# Patient Record
Sex: Female | Born: 1940 | Race: Black or African American | Hispanic: No | Smoking: Never smoker
Health system: Southern US, Community
[De-identification: ages and names within clinical notes are randomized; demographics above are authoritative.]

## PROBLEM LIST (undated history)

## (undated) DIAGNOSIS — Z853 Personal history of malignant neoplasm of breast: Secondary | ICD-10-CM

## (undated) DIAGNOSIS — I700 Atherosclerosis of aorta: Secondary | ICD-10-CM

## (undated) DIAGNOSIS — Z9221 Personal history of antineoplastic chemotherapy: Secondary | ICD-10-CM

## (undated) DIAGNOSIS — D213 Benign neoplasm of connective and other soft tissue of thorax: Secondary | ICD-10-CM

## (undated) DIAGNOSIS — Z8719 Personal history of other diseases of the digestive system: Secondary | ICD-10-CM

## (undated) DIAGNOSIS — R42 Dizziness and giddiness: Secondary | ICD-10-CM

## (undated) DIAGNOSIS — Z7901 Long term (current) use of anticoagulants: Secondary | ICD-10-CM

## (undated) DIAGNOSIS — E119 Type 2 diabetes mellitus without complications: Secondary | ICD-10-CM

## (undated) DIAGNOSIS — C50219 Malignant neoplasm of upper-inner quadrant of unspecified female breast: Secondary | ICD-10-CM

## (undated) DIAGNOSIS — Z923 Personal history of irradiation: Secondary | ICD-10-CM

## (undated) DIAGNOSIS — I10 Essential (primary) hypertension: Secondary | ICD-10-CM

## (undated) DIAGNOSIS — Z87442 Personal history of urinary calculi: Secondary | ICD-10-CM

## (undated) DIAGNOSIS — C50211 Malignant neoplasm of upper-inner quadrant of right female breast: Secondary | ICD-10-CM

## (undated) DIAGNOSIS — C50919 Malignant neoplasm of unspecified site of unspecified female breast: Secondary | ICD-10-CM

## (undated) DIAGNOSIS — I639 Cerebral infarction, unspecified: Secondary | ICD-10-CM

## (undated) DIAGNOSIS — I69354 Hemiplegia and hemiparesis following cerebral infarction affecting left non-dominant side: Secondary | ICD-10-CM

## (undated) DIAGNOSIS — N3090 Cystitis, unspecified without hematuria: Secondary | ICD-10-CM

## (undated) DIAGNOSIS — K469 Unspecified abdominal hernia without obstruction or gangrene: Secondary | ICD-10-CM

## (undated) DIAGNOSIS — F419 Anxiety disorder, unspecified: Secondary | ICD-10-CM

## (undated) DIAGNOSIS — K219 Gastro-esophageal reflux disease without esophagitis: Secondary | ICD-10-CM

## (undated) DIAGNOSIS — M1990 Unspecified osteoarthritis, unspecified site: Secondary | ICD-10-CM

## (undated) DIAGNOSIS — E785 Hyperlipidemia, unspecified: Secondary | ICD-10-CM

## (undated) HISTORY — DX: Malignant neoplasm of upper-inner quadrant of unspecified female breast: C50.219

## (undated) HISTORY — DX: Personal history of malignant neoplasm of breast: Z85.3

## (undated) HISTORY — PX: PORT-A-CATH REMOVAL: SHX5289

## (undated) HISTORY — DX: Benign neoplasm of connective and other soft tissue of thorax: D21.3

## (undated) HISTORY — DX: Unspecified abdominal hernia without obstruction or gangrene: K46.9

## (undated) HISTORY — DX: Dizziness and giddiness: R42

## (undated) HISTORY — DX: Hyperlipidemia, unspecified: E78.5

## (undated) HISTORY — DX: Malignant neoplasm of upper-inner quadrant of right female breast: C50.211

## (undated) HISTORY — DX: Cystitis, unspecified without hematuria: N30.90

---

## 2002-08-07 HISTORY — PX: DILATION AND CURETTAGE OF UTERUS: SHX78

## 2005-05-31 ENCOUNTER — Ambulatory Visit: Payer: Self-pay

## 2005-07-10 ENCOUNTER — Emergency Department: Payer: Self-pay | Admitting: Emergency Medicine

## 2005-07-11 ENCOUNTER — Ambulatory Visit: Payer: Self-pay | Admitting: Emergency Medicine

## 2005-07-11 ENCOUNTER — Other Ambulatory Visit: Payer: Self-pay

## 2005-08-07 HISTORY — PX: CHOLECYSTECTOMY: SHX55

## 2005-08-08 ENCOUNTER — Ambulatory Visit: Payer: Self-pay | Admitting: General Surgery

## 2006-06-20 ENCOUNTER — Ambulatory Visit: Payer: Self-pay | Admitting: Family Medicine

## 2007-05-28 ENCOUNTER — Ambulatory Visit: Payer: Self-pay | Admitting: Gastroenterology

## 2007-06-24 ENCOUNTER — Ambulatory Visit: Payer: Self-pay | Admitting: Family Medicine

## 2007-09-10 ENCOUNTER — Ambulatory Visit: Payer: Self-pay | Admitting: General Surgery

## 2007-09-10 HISTORY — PX: HERNIA REPAIR: SHX51

## 2007-12-18 ENCOUNTER — Ambulatory Visit: Payer: Self-pay | Admitting: Family Medicine

## 2007-12-23 ENCOUNTER — Other Ambulatory Visit: Payer: Self-pay

## 2007-12-23 ENCOUNTER — Emergency Department: Payer: Self-pay | Admitting: Emergency Medicine

## 2008-01-02 ENCOUNTER — Ambulatory Visit: Payer: Self-pay | Admitting: General Surgery

## 2008-01-07 ENCOUNTER — Ambulatory Visit: Payer: Self-pay | Admitting: General Surgery

## 2008-01-07 HISTORY — PX: RECURRENT HERNIA: SHX6046

## 2008-03-31 ENCOUNTER — Emergency Department: Payer: Self-pay | Admitting: Emergency Medicine

## 2008-03-31 ENCOUNTER — Other Ambulatory Visit: Payer: Self-pay

## 2008-06-25 ENCOUNTER — Ambulatory Visit: Payer: Self-pay | Admitting: Family Medicine

## 2008-07-08 ENCOUNTER — Ambulatory Visit: Payer: Self-pay | Admitting: Family Medicine

## 2008-07-27 ENCOUNTER — Emergency Department: Payer: Self-pay | Admitting: Emergency Medicine

## 2009-01-28 DIAGNOSIS — N882 Stricture and stenosis of cervix uteri: Secondary | ICD-10-CM | POA: Insufficient documentation

## 2009-06-29 ENCOUNTER — Ambulatory Visit: Payer: Self-pay | Admitting: Family Medicine

## 2009-08-07 DIAGNOSIS — K469 Unspecified abdominal hernia without obstruction or gangrene: Secondary | ICD-10-CM

## 2009-08-07 HISTORY — PX: UPPER GI ENDOSCOPY: SHX6162

## 2009-08-07 HISTORY — DX: Unspecified abdominal hernia without obstruction or gangrene: K46.9

## 2009-08-07 HISTORY — PX: COLONOSCOPY: SHX174

## 2009-09-26 ENCOUNTER — Emergency Department: Payer: Self-pay | Admitting: Internal Medicine

## 2009-10-06 ENCOUNTER — Emergency Department: Payer: Self-pay | Admitting: Emergency Medicine

## 2009-12-24 ENCOUNTER — Ambulatory Visit: Payer: Self-pay | Admitting: Family Medicine

## 2010-01-24 ENCOUNTER — Ambulatory Visit: Payer: Self-pay | Admitting: Family Medicine

## 2010-01-26 ENCOUNTER — Ambulatory Visit: Payer: Self-pay | Admitting: Family Medicine

## 2010-02-02 ENCOUNTER — Emergency Department: Payer: Self-pay | Admitting: Emergency Medicine

## 2010-02-14 ENCOUNTER — Ambulatory Visit: Payer: Self-pay | Admitting: Family Medicine

## 2010-03-02 ENCOUNTER — Ambulatory Visit: Payer: Self-pay | Admitting: Gastroenterology

## 2010-07-05 ENCOUNTER — Ambulatory Visit: Payer: Self-pay | Admitting: Family Medicine

## 2010-08-07 HISTORY — PX: BREAST SURGERY: SHX581

## 2010-11-01 ENCOUNTER — Emergency Department: Payer: Self-pay | Admitting: Emergency Medicine

## 2011-01-29 ENCOUNTER — Emergency Department: Payer: Self-pay | Admitting: Emergency Medicine

## 2011-07-10 ENCOUNTER — Ambulatory Visit: Payer: Self-pay | Admitting: Family Medicine

## 2011-07-11 ENCOUNTER — Ambulatory Visit: Payer: Self-pay | Admitting: Family Medicine

## 2011-07-18 HISTORY — PX: BREAST BIOPSY: SHX20

## 2011-07-21 ENCOUNTER — Ambulatory Visit: Payer: Self-pay | Admitting: General Surgery

## 2011-08-08 DIAGNOSIS — Z9221 Personal history of antineoplastic chemotherapy: Secondary | ICD-10-CM

## 2011-08-08 DIAGNOSIS — C50919 Malignant neoplasm of unspecified site of unspecified female breast: Secondary | ICD-10-CM

## 2011-08-08 HISTORY — PX: PORTACATH PLACEMENT: SHX2246

## 2011-08-08 HISTORY — DX: Personal history of antineoplastic chemotherapy: Z92.21

## 2011-08-08 HISTORY — DX: Malignant neoplasm of unspecified site of unspecified female breast: C50.919

## 2011-08-17 ENCOUNTER — Ambulatory Visit: Payer: Self-pay | Admitting: General Surgery

## 2011-08-17 DIAGNOSIS — Z170 Estrogen receptor positive status [ER+]: Secondary | ICD-10-CM | POA: Insufficient documentation

## 2011-08-17 DIAGNOSIS — C50219 Malignant neoplasm of upper-inner quadrant of unspecified female breast: Secondary | ICD-10-CM

## 2011-08-17 DIAGNOSIS — C50411 Malignant neoplasm of upper-outer quadrant of right female breast: Secondary | ICD-10-CM | POA: Insufficient documentation

## 2011-08-17 HISTORY — PX: BREAST LUMPECTOMY: SHX2

## 2011-08-17 HISTORY — DX: Malignant neoplasm of upper-inner quadrant of unspecified female breast: C50.219

## 2011-08-18 LAB — PATHOLOGY REPORT

## 2011-08-23 ENCOUNTER — Ambulatory Visit: Payer: Self-pay | Admitting: Internal Medicine

## 2011-08-31 LAB — CBC CANCER CENTER
Basophil #: 0.1 x10 3/mm (ref 0.0–0.1)
HCT: 44.8 % (ref 35.0–47.0)
Lymphocyte #: 2.3 x10 3/mm (ref 1.0–3.6)
Lymphocyte %: 37.8 %
MCHC: 34.3 g/dL (ref 32.0–36.0)
Monocyte #: 0.5 x10 3/mm (ref 0.0–0.7)
Monocyte %: 9 %
Neutrophil #: 2.8 x10 3/mm (ref 1.4–6.5)
Neutrophil %: 46.9 %
Platelet: 301 x10 3/mm (ref 150–440)
RDW: 12.4 % (ref 11.5–14.5)
WBC: 6 x10 3/mm (ref 3.6–11.0)

## 2011-08-31 LAB — COMPREHENSIVE METABOLIC PANEL
Albumin: 3.7 g/dL (ref 3.4–5.0)
Calcium, Total: 9.6 mg/dL (ref 8.5–10.1)
Chloride: 106 mmol/L (ref 98–107)
Glucose: 77 mg/dL (ref 65–99)
Osmolality: 282 (ref 275–301)
Potassium: 3.9 mmol/L (ref 3.5–5.1)
SGPT (ALT): 26 U/L
Sodium: 142 mmol/L (ref 136–145)
Total Protein: 7.9 g/dL (ref 6.4–8.2)

## 2011-09-04 ENCOUNTER — Ambulatory Visit: Payer: Self-pay | Admitting: General Surgery

## 2011-09-08 ENCOUNTER — Ambulatory Visit: Payer: Self-pay | Admitting: Internal Medicine

## 2011-09-11 LAB — CBC CANCER CENTER
Basophil #: 0.1 x10 3/mm (ref 0.0–0.1)
Basophil %: 1 %
Eosinophil #: 0.3 x10 3/mm (ref 0.0–0.7)
Eosinophil %: 4.8 %
HGB: 14.9 g/dL (ref 12.0–16.0)
Lymphocyte #: 1.8 x10 3/mm (ref 1.0–3.6)
Lymphocyte %: 30.6 %
MCH: 30.7 pg (ref 26.0–34.0)
MCHC: 34.4 g/dL (ref 32.0–36.0)
MCV: 89 fL (ref 80–100)
Monocyte #: 0.4 x10 3/mm (ref 0.0–0.7)
Monocyte %: 7.5 %
Neutrophil #: 3.3 x10 3/mm (ref 1.4–6.5)
Neutrophil %: 56.1 %
RBC: 4.84 10*6/uL (ref 3.80–5.20)
RDW: 12.4 % (ref 11.5–14.5)
WBC: 6 x10 3/mm (ref 3.6–11.0)

## 2011-09-11 LAB — BASIC METABOLIC PANEL
Anion Gap: 7 (ref 7–16)
BUN: 8 mg/dL (ref 7–18)
Calcium, Total: 9.1 mg/dL (ref 8.5–10.1)
Chloride: 103 mmol/L (ref 98–107)
Creatinine: 0.77 mg/dL (ref 0.60–1.30)
EGFR (African American): >60
Glucose: 106 mg/dL — ABNORMAL HIGH (ref 65–99)
Osmolality: 282 (ref 275–301)
Potassium: 3.7 mmol/L (ref 3.5–5.1)
Sodium: 142 mmol/L (ref 136–145)

## 2011-09-18 LAB — CBC CANCER CENTER
Basophil #: 0 x10 3/mm (ref 0.0–0.1)
Eosinophil %: 8.4 %
HCT: 44.6 % (ref 35.0–47.0)
Lymphocyte #: 1.3 x10 3/mm (ref 1.0–3.6)
MCH: 30.7 pg (ref 26.0–34.0)
MCV: 89 fL (ref 80–100)
Monocyte %: 1.5 %
Neutrophil %: 48.2 %
Platelet: 126 x10 3/mm — ABNORMAL LOW (ref 150–440)

## 2011-09-25 LAB — CBC CANCER CENTER
Basophil #: 0.2 x10 3/mm — ABNORMAL HIGH (ref 0.0–0.1)
Eosinophil %: 0.7 %
HGB: 14.4 g/dL (ref 12.0–16.0)
Lymphocyte #: 1.5 x10 3/mm (ref 1.0–3.6)
Lymphocyte %: 14.3 %
Monocyte #: 0.5 x10 3/mm (ref 0.0–0.7)
Monocyte %: 5 %
Neutrophil %: 78.1 %
Platelet: 266 x10 3/mm (ref 150–440)
RBC: 4.68 10*6/uL (ref 3.80–5.20)
RDW: 12.4 % (ref 11.5–14.5)
WBC: 10.8 x10 3/mm (ref 3.6–11.0)

## 2011-10-02 LAB — CBC CANCER CENTER
Eosinophil #: 0.1 x10 3/mm (ref 0.0–0.7)
Eosinophil %: 0.8 %
MCH: 30.7 pg (ref 26.0–34.0)
MCHC: 33.9 g/dL (ref 32.0–36.0)
Monocyte %: 12.9 %
Neutrophil #: 3.8 x10 3/mm (ref 1.4–6.5)
Neutrophil %: 59 %
Platelet: 384 x10 3/mm (ref 150–440)
RBC: 4.34 10*6/uL (ref 3.80–5.20)
WBC: 6.4 x10 3/mm (ref 3.6–11.0)

## 2011-10-02 LAB — COMPREHENSIVE METABOLIC PANEL
Albumin: 3.6 g/dL (ref 3.4–5.0)
Alkaline Phosphatase: 101 U/L (ref 50–136)
BUN: 15 mg/dL (ref 7–18)
Chloride: 104 mmol/L (ref 98–107)
Creatinine: 0.77 mg/dL (ref 0.60–1.30)
EGFR (Non-African Amer.): >60
Osmolality: 286 (ref 275–301)
Potassium: 3.8 mmol/L (ref 3.5–5.1)
Total Protein: 7.2 g/dL (ref 6.4–8.2)

## 2011-10-06 ENCOUNTER — Ambulatory Visit: Payer: Self-pay | Admitting: Internal Medicine

## 2011-10-09 LAB — CBC CANCER CENTER
Basophil #: 0 x10 3/mm (ref 0.0–0.1)
Eosinophil %: 1.6 %
HCT: 39.3 % (ref 35.0–47.0)
Lymphocyte #: 0.9 x10 3/mm — ABNORMAL LOW (ref 1.0–3.6)
Lymphocyte %: 29.2 %
MCH: 30.7 pg (ref 26.0–34.0)
MCHC: 33.9 g/dL (ref 32.0–36.0)
MCV: 90 fL (ref 80–100)
Neutrophil #: 2 x10 3/mm (ref 1.4–6.5)
Platelet: 141 x10 3/mm — ABNORMAL LOW (ref 150–440)
RDW: 13.3 % (ref 11.5–14.5)

## 2011-10-16 LAB — CBC CANCER CENTER
Basophil #: 0 x10 3/mm (ref 0.0–0.1)
HCT: 38.5 % (ref 35.0–47.0)
HGB: 13.3 g/dL (ref 12.0–16.0)
Lymphocyte %: 15.2 %
MCV: 91 fL (ref 80–100)
Monocyte %: 6.6 %
Neutrophil #: 8 x10 3/mm — ABNORMAL HIGH (ref 1.4–6.5)
RBC: 4.25 10*6/uL (ref 3.80–5.20)
RDW: 13.3 % (ref 11.5–14.5)
WBC: 10.5 x10 3/mm (ref 3.6–11.0)

## 2011-10-23 LAB — COMPREHENSIVE METABOLIC PANEL WITH GFR
Albumin: 3.6 g/dL
Alkaline Phosphatase: 89 U/L
Anion Gap: 9
BUN: 11 mg/dL
Bilirubin,Total: 0.3 mg/dL
Calcium, Total: 9 mg/dL
Chloride: 103 mmol/L
Co2: 28 mmol/L
Creatinine: 0.83 mg/dL
EGFR (African American): >60
EGFR (Non-African Amer.): >60
Glucose: 164 mg/dL — ABNORMAL HIGH
Osmolality: 282
Potassium: 3.5 mmol/L
SGOT(AST): 19 U/L
SGPT (ALT): 32 U/L
Sodium: 140 mmol/L
Total Protein: 6.9 g/dL

## 2011-10-23 LAB — CBC CANCER CENTER
Basophil #: 0.1 "x10 3/mm "
Basophil %: 1.4 %
Eosinophil #: 0.1 "x10 3/mm "
Eosinophil %: 1.4 %
HCT: 36.9 %
HGB: 12.8 g/dL
Lymphocyte %: 24.2 %
Lymphs Abs: 1.2 "x10 3/mm "
MCH: 31.7 pg
MCHC: 34.6 g/dL
MCV: 92 fL
Monocyte #: 0.7 "x10 3/mm "
Monocyte %: 14.3 %
Neutrophil #: 2.8 "x10 3/mm "
Neutrophil %: 58.7 %
Platelet: 359 "x10 3/mm "
RBC: 4.03 "x10 6/mm "
RDW: 15 % — ABNORMAL HIGH
WBC: 4.8 "x10 3/mm "

## 2011-10-30 LAB — CBC CANCER CENTER
Basophil #: 0 x10 3/mm (ref 0.0–0.1)
Basophil %: 1 %
Eosinophil #: 0 x10 3/mm (ref 0.0–0.7)
HCT: 36.2 % (ref 35.0–47.0)
Lymphocyte %: 23.7 %
MCH: 31.5 pg (ref 26.0–34.0)
Monocyte #: 0.1 x10 3/mm (ref 0.0–0.7)
Monocyte %: 2.1 %
Neutrophil #: 2.1 x10 3/mm (ref 1.4–6.5)
Neutrophil %: 71.8 %
Platelet: 98 x10 3/mm — ABNORMAL LOW (ref 150–440)
RBC: 3.98 10*6/uL (ref 3.80–5.20)
RDW: 14.9 % — ABNORMAL HIGH (ref 11.5–14.5)
WBC: 3 x10 3/mm — ABNORMAL LOW (ref 3.6–11.0)

## 2011-11-06 ENCOUNTER — Ambulatory Visit: Payer: Self-pay | Admitting: Internal Medicine

## 2011-11-06 LAB — CBC CANCER CENTER
Basophil #: 0 x10 3/mm (ref 0.0–0.1)
Eosinophil #: 0.1 x10 3/mm (ref 0.0–0.7)
HCT: 37.5 % (ref 35.0–47.0)
HGB: 12.8 g/dL (ref 12.0–16.0)
Lymphocyte #: 1.3 x10 3/mm (ref 1.0–3.6)
MCH: 31.5 pg (ref 26.0–34.0)
MCV: 92 fL (ref 80–100)
Monocyte #: 0.8 x10 3/mm — ABNORMAL HIGH (ref 0.0–0.7)
Monocyte %: 9.6 %
Neutrophil #: 5.8 x10 3/mm (ref 1.4–6.5)
Neutrophil %: 72.5 %
Platelet: 230 x10 3/mm (ref 150–440)
WBC: 8 x10 3/mm (ref 3.6–11.0)

## 2011-11-13 LAB — COMPREHENSIVE METABOLIC PANEL
Albumin: 3.8 g/dL (ref 3.4–5.0)
Anion Gap: 10 (ref 7–16)
BUN: 15 mg/dL (ref 7–18)
Bilirubin,Total: 0.2 mg/dL (ref 0.2–1.0)
Co2: 27 mmol/L (ref 21–32)
EGFR (African American): >60
EGFR (Non-African Amer.): >60
Glucose: 155 mg/dL — ABNORMAL HIGH (ref 65–99)
Potassium: 4 mmol/L (ref 3.5–5.1)
SGOT(AST): 21 U/L (ref 15–37)
SGPT (ALT): 27 U/L
Sodium: 143 mmol/L (ref 136–145)
Total Protein: 7.2 g/dL (ref 6.4–8.2)

## 2011-11-13 LAB — CBC CANCER CENTER
Basophil #: 0 x10 3/mm (ref 0.0–0.1)
HGB: 12.3 g/dL (ref 12.0–16.0)
Lymphocyte %: 21.5 %
MCHC: 34.4 g/dL (ref 32.0–36.0)
MCV: 93 fL (ref 80–100)
Monocyte %: 17.1 %
Neutrophil %: 59.1 %
Platelet: 319 x10 3/mm (ref 150–440)
RDW: 17.1 % — ABNORMAL HIGH (ref 11.5–14.5)

## 2011-11-20 LAB — CBC CANCER CENTER
Basophil #: 0 x10 3/mm (ref 0.0–0.1)
Basophil %: 0.9 %
HCT: 36.2 % (ref 35.0–47.0)
HGB: 12.1 g/dL (ref 12.0–16.0)
Lymphocyte %: 15.9 %
MCH: 31.7 pg (ref 26.0–34.0)
MCHC: 33.4 g/dL (ref 32.0–36.0)
MCV: 95 fL (ref 80–100)
Neutrophil #: 3.5 x10 3/mm (ref 1.4–6.5)
Neutrophil %: 79.4 %
RBC: 3.81 10*6/uL (ref 3.80–5.20)
RDW: 15.9 % — ABNORMAL HIGH (ref 11.5–14.5)

## 2011-11-27 LAB — CBC CANCER CENTER
Basophil #: 0.1 x10 3/mm (ref 0.0–0.1)
HCT: 38.4 % (ref 35.0–47.0)
HGB: 12.7 g/dL (ref 12.0–16.0)
Lymphocyte #: 1.2 x10 3/mm (ref 1.0–3.6)
Lymphocyte %: 15.9 %
MCH: 31.7 pg (ref 26.0–34.0)
Monocyte #: 1.3 x10 3/mm — ABNORMAL HIGH (ref 0.2–0.9)
Monocyte %: 17.1 %
Neutrophil %: 64.2 %
RBC: 4.02 10*6/uL (ref 3.80–5.20)
RDW: 16.6 % — ABNORMAL HIGH (ref 11.5–14.5)
WBC: 7.4 x10 3/mm (ref 3.6–11.0)

## 2011-12-04 LAB — COMPREHENSIVE METABOLIC PANEL
Alkaline Phosphatase: 94 U/L (ref 50–136)
BUN: 13 mg/dL (ref 7–18)
Bilirubin,Total: 0.3 mg/dL (ref 0.2–1.0)
Calcium, Total: 9.4 mg/dL (ref 8.5–10.1)
Chloride: 102 mmol/L (ref 98–107)
Creatinine: 0.99 mg/dL (ref 0.60–1.30)
EGFR (Non-African Amer.): 58 — ABNORMAL LOW
Osmolality: 290 (ref 275–301)
Potassium: 4.1 mmol/L (ref 3.5–5.1)
SGPT (ALT): 44 U/L

## 2011-12-04 LAB — CBC CANCER CENTER
Basophil %: 0.3 %
Eosinophil #: 0 x10 3/mm (ref 0.0–0.7)
Eosinophil %: 0 %
MCH: 31.6 pg (ref 26.0–34.0)
Monocyte %: 0.5 %
Neutrophil #: 4.1 x10 3/mm (ref 1.4–6.5)
Neutrophil %: 87.5 %
RDW: 17 % — ABNORMAL HIGH (ref 11.5–14.5)

## 2011-12-06 ENCOUNTER — Ambulatory Visit: Payer: Self-pay | Admitting: Internal Medicine

## 2011-12-11 LAB — CBC CANCER CENTER
Eosinophil #: 0 x10 3/mm (ref 0.0–0.7)
Eosinophil %: 0 %
MCV: 97 fL (ref 80–100)
Monocyte %: 1.6 %
Neutrophil #: 4.5 x10 3/mm (ref 1.4–6.5)
Platelet: 272 x10 3/mm (ref 150–440)
WBC: 5 x10 3/mm (ref 3.6–11.0)

## 2011-12-18 LAB — CBC CANCER CENTER
Basophil #: 0 x10 3/mm (ref 0.0–0.1)
Eosinophil #: 0 x10 3/mm (ref 0.0–0.7)
HCT: 37.1 % (ref 35.0–47.0)
HGB: 12.5 g/dL (ref 12.0–16.0)
Lymphocyte #: 0.6 x10 3/mm — ABNORMAL LOW (ref 1.0–3.6)
Lymphocyte %: 13.6 %
MCH: 32.9 pg (ref 26.0–34.0)
MCHC: 33.6 g/dL (ref 32.0–36.0)
MCV: 98 fL (ref 80–100)
Monocyte #: 0.1 x10 3/mm — ABNORMAL LOW (ref 0.2–0.9)
Neutrophil #: 3.6 x10 3/mm (ref 1.4–6.5)
Neutrophil %: 84.1 %
RBC: 3.78 10*6/uL — ABNORMAL LOW (ref 3.80–5.20)
WBC: 4.3 x10 3/mm (ref 3.6–11.0)

## 2011-12-25 LAB — CBC CANCER CENTER
Basophil #: 0.2 x10 3/mm — ABNORMAL HIGH (ref 0.0–0.1)
Eosinophil #: 0.3 x10 3/mm (ref 0.0–0.7)
HCT: 37.9 % (ref 35.0–47.0)
HGB: 12.7 g/dL (ref 12.0–16.0)
Lymphocyte #: 0.8 x10 3/mm — ABNORMAL LOW (ref 1.0–3.6)
Lymphocyte %: 17 %
MCH: 32.6 pg (ref 26.0–34.0)
MCHC: 33.5 g/dL (ref 32.0–36.0)
Monocyte #: 0.6 x10 3/mm (ref 0.2–0.9)
Neutrophil #: 3 x10 3/mm (ref 1.4–6.5)
Neutrophil %: 62.3 %
Platelet: 319 x10 3/mm (ref 150–440)
RBC: 3.89 10*6/uL (ref 3.80–5.20)
RDW: 15.1 % — ABNORMAL HIGH (ref 11.5–14.5)

## 2011-12-25 LAB — URINALYSIS, COMPLETE
Bilirubin,UR: NEGATIVE
Blood: NEGATIVE
Glucose,UR: NEGATIVE mg/dL (ref 0–75)
Ketone: NEGATIVE
Ph: 5 (ref 4.5–8.0)
Protein: NEGATIVE
Squamous Epithelial: 1
WBC UR: 3 /HPF (ref 0–5)

## 2011-12-25 LAB — COMPREHENSIVE METABOLIC PANEL
Albumin: 3.5 g/dL (ref 3.4–5.0)
Anion Gap: 13 (ref 7–16)
BUN: 12 mg/dL (ref 7–18)
Bilirubin,Total: 0.3 mg/dL (ref 0.2–1.0)
Calcium, Total: 8.9 mg/dL (ref 8.5–10.1)
Chloride: 103 mmol/L (ref 98–107)
EGFR (African American): >60
Glucose: 188 mg/dL — ABNORMAL HIGH (ref 65–99)
Potassium: 3.9 mmol/L (ref 3.5–5.1)
SGOT(AST): 27 U/L (ref 15–37)
SGPT (ALT): 38 U/L
Sodium: 140 mmol/L (ref 136–145)
Total Protein: 7.1 g/dL (ref 6.4–8.2)

## 2012-01-02 LAB — CBC CANCER CENTER
Basophil #: 0.1 x10 3/mm (ref 0.0–0.1)
Basophil %: 1.7 %
Eosinophil #: 0.2 x10 3/mm (ref 0.0–0.7)
HCT: 35 % (ref 35.0–47.0)
Lymphocyte %: 17.8 %
MCV: 96 fL (ref 80–100)
Monocyte %: 13.9 %
Neutrophil #: 3.7 x10 3/mm (ref 1.4–6.5)
Platelet: 293 x10 3/mm (ref 150–440)
WBC: 5.9 x10 3/mm (ref 3.6–11.0)

## 2012-01-06 ENCOUNTER — Ambulatory Visit: Payer: Self-pay | Admitting: Internal Medicine

## 2012-01-08 LAB — CBC CANCER CENTER
Basophil %: 1.4 %
Eosinophil #: 0.2 x10 3/mm (ref 0.0–0.7)
HCT: 36.9 % (ref 35.0–47.0)
HGB: 12.3 g/dL (ref 12.0–16.0)
Lymphocyte #: 0.8 x10 3/mm — ABNORMAL LOW (ref 1.0–3.6)
Lymphocyte %: 14 %
MCH: 32.4 pg (ref 26.0–34.0)
MCV: 97 fL (ref 80–100)
Monocyte #: 0.5 x10 3/mm (ref 0.2–0.9)
Neutrophil #: 4.1 x10 3/mm (ref 1.4–6.5)
Neutrophil %: 73.3 %
RBC: 3.81 10*6/uL (ref 3.80–5.20)
WBC: 5.6 x10 3/mm (ref 3.6–11.0)

## 2012-01-15 LAB — COMPREHENSIVE METABOLIC PANEL
Albumin: 3.2 g/dL — ABNORMAL LOW (ref 3.4–5.0)
Anion Gap: 12 (ref 7–16)
BUN: 12 mg/dL (ref 7–18)
Bilirubin,Total: 0.2 mg/dL (ref 0.2–1.0)
Calcium, Total: 8.9 mg/dL (ref 8.5–10.1)
Co2: 25 mmol/L (ref 21–32)
EGFR (African American): >60
Osmolality: 276 (ref 275–301)
Potassium: 4.2 mmol/L (ref 3.5–5.1)
SGOT(AST): 22 U/L (ref 15–37)
Sodium: 134 mmol/L — ABNORMAL LOW (ref 136–145)
Total Protein: 7.1 g/dL (ref 6.4–8.2)

## 2012-01-15 LAB — CBC CANCER CENTER
Basophil #: 0.1 x10 3/mm (ref 0.0–0.1)
Basophil %: 1.2 %
Eosinophil #: 0.1 x10 3/mm (ref 0.0–0.7)
HGB: 11.9 g/dL — ABNORMAL LOW (ref 12.0–16.0)
Lymphocyte %: 14.7 %
MCH: 32.6 pg (ref 26.0–34.0)
MCHC: 33.4 g/dL (ref 32.0–36.0)
Monocyte #: 0.7 x10 3/mm (ref 0.2–0.9)
Monocyte %: 10.6 %
Neutrophil #: 4.7 x10 3/mm (ref 1.4–6.5)
Neutrophil %: 72 %
RBC: 3.66 10*6/uL — ABNORMAL LOW (ref 3.80–5.20)
RDW: 14 % (ref 11.5–14.5)
WBC: 6.6 x10 3/mm (ref 3.6–11.0)

## 2012-01-18 ENCOUNTER — Emergency Department: Payer: Self-pay | Admitting: Emergency Medicine

## 2012-01-18 LAB — APTT: Activated PTT: 29.7 secs (ref 23.6–35.9)

## 2012-01-18 LAB — COMPREHENSIVE METABOLIC PANEL
Albumin: 3.3 g/dL — ABNORMAL LOW (ref 3.4–5.0)
Alkaline Phosphatase: 91 U/L (ref 50–136)
Anion Gap: 8 (ref 7–16)
BUN: 8 mg/dL (ref 7–18)
Bilirubin,Total: 0.3 mg/dL (ref 0.2–1.0)
Chloride: 103 mmol/L (ref 98–107)
Co2: 26 mmol/L (ref 21–32)
EGFR (African American): >60
Glucose: 165 mg/dL — ABNORMAL HIGH (ref 65–99)
Osmolality: 276 (ref 275–301)
Potassium: 4.1 mmol/L (ref 3.5–5.1)
SGPT (ALT): 28 U/L
Total Protein: 7.3 g/dL (ref 6.4–8.2)

## 2012-01-18 LAB — PROTIME-INR
INR: 1
Prothrombin Time: 13.8 secs (ref 11.5–14.7)

## 2012-01-18 LAB — CBC
MCH: 32.5 pg (ref 26.0–34.0)
MCV: 96 fL (ref 80–100)
Platelet: 341 10*3/uL (ref 150–440)
RBC: 3.75 10*6/uL — ABNORMAL LOW (ref 3.80–5.20)
RDW: 14.3 % (ref 11.5–14.5)
WBC: 5.4 10*3/uL (ref 3.6–11.0)

## 2012-01-22 LAB — CBC CANCER CENTER
Basophil #: 0.1 x10 3/mm (ref 0.0–0.1)
Basophil %: 1.3 %
Eosinophil %: 1.8 %
Lymphocyte #: 0.9 x10 3/mm — ABNORMAL LOW (ref 1.0–3.6)
Lymphocyte %: 12.2 %
MCH: 32.2 pg (ref 26.0–34.0)
MCHC: 33.3 g/dL (ref 32.0–36.0)
MCV: 97 fL (ref 80–100)
Neutrophil #: 5.3 x10 3/mm (ref 1.4–6.5)
Neutrophil %: 74.4 %
Platelet: 389 x10 3/mm (ref 150–440)
RBC: 3.71 10*6/uL — ABNORMAL LOW (ref 3.80–5.20)
RDW: 14.1 % (ref 11.5–14.5)
WBC: 7.1 x10 3/mm (ref 3.6–11.0)

## 2012-01-26 LAB — CBC CANCER CENTER
Basophil #: 0.1 x10 3/mm (ref 0.0–0.1)
HGB: 12.9 g/dL (ref 12.0–16.0)
Lymphocyte %: 18.3 %
MCH: 32.3 pg (ref 26.0–34.0)
MCHC: 33.6 g/dL (ref 32.0–36.0)
MCV: 96 fL (ref 80–100)
Monocyte #: 0.3 x10 3/mm (ref 0.2–0.9)
Neutrophil #: 3 x10 3/mm (ref 1.4–6.5)
RBC: 4.01 10*6/uL (ref 3.80–5.20)
RDW: 14.2 % (ref 11.5–14.5)
WBC: 4.1 x10 3/mm (ref 3.6–11.0)

## 2012-01-26 LAB — COMPREHENSIVE METABOLIC PANEL
Albumin: 3.3 g/dL — ABNORMAL LOW (ref 3.4–5.0)
Alkaline Phosphatase: 110 U/L (ref 50–136)
BUN: 9 mg/dL (ref 7–18)
Bilirubin,Total: 0.2 mg/dL (ref 0.2–1.0)
Calcium, Total: 9.9 mg/dL (ref 8.5–10.1)
Chloride: 100 mmol/L (ref 98–107)
EGFR (African American): >60
EGFR (Non-African Amer.): >60
Glucose: 192 mg/dL — ABNORMAL HIGH (ref 65–99)
Osmolality: 276 (ref 275–301)

## 2012-01-29 LAB — CBC CANCER CENTER
Basophil #: 0.1 x10 3/mm (ref 0.0–0.1)
Eosinophil #: 0.1 x10 3/mm (ref 0.0–0.7)
HCT: 35 % (ref 35.0–47.0)
Lymphocyte %: 15.2 %
MCH: 32.2 pg (ref 26.0–34.0)
MCHC: 33.6 g/dL (ref 32.0–36.0)
Monocyte #: 0.7 x10 3/mm (ref 0.2–0.9)
Monocyte %: 11.2 %
Neutrophil #: 4.3 x10 3/mm (ref 1.4–6.5)
Neutrophil %: 70.8 %
Platelet: 381 x10 3/mm (ref 150–440)
RBC: 3.65 10*6/uL — ABNORMAL LOW (ref 3.80–5.20)
RDW: 14.5 % (ref 11.5–14.5)
WBC: 6 x10 3/mm (ref 3.6–11.0)

## 2012-02-05 ENCOUNTER — Ambulatory Visit: Payer: Self-pay | Admitting: Internal Medicine

## 2012-02-05 LAB — COMPREHENSIVE METABOLIC PANEL
Alkaline Phosphatase: 90 U/L (ref 50–136)
Anion Gap: 11 (ref 7–16)
BUN: 9 mg/dL (ref 7–18)
Bilirubin,Total: 0.2 mg/dL (ref 0.2–1.0)
Calcium, Total: 8.9 mg/dL (ref 8.5–10.1)
Chloride: 100 mmol/L (ref 98–107)
Co2: 24 mmol/L (ref 21–32)
EGFR (African American): >60
EGFR (Non-African Amer.): >60
Osmolality: 279 (ref 275–301)
Potassium: 4.1 mmol/L (ref 3.5–5.1)
SGOT(AST): 25 U/L (ref 15–37)
SGPT (ALT): 34 U/L
Total Protein: 6.8 g/dL (ref 6.4–8.2)

## 2012-02-05 LAB — CBC CANCER CENTER
Basophil #: 0.1 x10 3/mm (ref 0.0–0.1)
Basophil %: 1.3 %
Eosinophil %: 2.5 %
HCT: 36.8 % (ref 35.0–47.0)
Lymphocyte #: 1 x10 3/mm (ref 1.0–3.6)
Lymphocyte %: 18.1 %
MCHC: 33 g/dL (ref 32.0–36.0)
MCV: 96 fL (ref 80–100)
Monocyte #: 0.6 x10 3/mm (ref 0.2–0.9)
Monocyte %: 12 %
Neutrophil #: 3.5 x10 3/mm (ref 1.4–6.5)
Neutrophil %: 66.1 %
Platelet: 370 x10 3/mm (ref 150–440)
RDW: 15 % — ABNORMAL HIGH (ref 11.5–14.5)
WBC: 5.3 x10 3/mm (ref 3.6–11.0)

## 2012-02-12 LAB — CBC CANCER CENTER
Basophil %: 1.6 %
Eosinophil #: 0.1 x10 3/mm (ref 0.0–0.7)
Eosinophil %: 2.1 %
Lymphocyte #: 0.9 x10 3/mm — ABNORMAL LOW (ref 1.0–3.6)
MCH: 31.9 pg (ref 26.0–34.0)
MCV: 95 fL (ref 80–100)
Neutrophil %: 61.2 %
Platelet: 355 x10 3/mm (ref 150–440)
RBC: 3.83 10*6/uL (ref 3.80–5.20)
RDW: 14.9 % — ABNORMAL HIGH (ref 11.5–14.5)
WBC: 4.2 x10 3/mm (ref 3.6–11.0)

## 2012-02-19 LAB — CBC CANCER CENTER
Basophil %: 1.5 %
HCT: 37.5 % (ref 35.0–47.0)
Lymphocyte #: 1 x10 3/mm (ref 1.0–3.6)
Lymphocyte %: 22.6 %
MCH: 31 pg (ref 26.0–34.0)
MCHC: 32.5 g/dL (ref 32.0–36.0)
Monocyte #: 0.5 x10 3/mm (ref 0.2–0.9)
Neutrophil #: 2.7 x10 3/mm (ref 1.4–6.5)
Neutrophil %: 63.2 %
RDW: 15.1 % — ABNORMAL HIGH (ref 11.5–14.5)

## 2012-02-26 LAB — COMPREHENSIVE METABOLIC PANEL
Albumin: 3.2 g/dL — ABNORMAL LOW (ref 3.4–5.0)
Anion Gap: 11 (ref 7–16)
BUN: 9 mg/dL (ref 7–18)
Bilirubin,Total: 0.2 mg/dL (ref 0.2–1.0)
Co2: 25 mmol/L (ref 21–32)
Creatinine: 1.15 mg/dL (ref 0.60–1.30)
EGFR (African American): 56 — ABNORMAL LOW
EGFR (Non-African Amer.): 49 — ABNORMAL LOW
Glucose: 214 mg/dL — ABNORMAL HIGH (ref 65–99)
Osmolality: 285 (ref 275–301)
Potassium: 3.9 mmol/L (ref 3.5–5.1)
Sodium: 140 mmol/L (ref 136–145)
Total Protein: 6.7 g/dL (ref 6.4–8.2)

## 2012-02-26 LAB — CBC CANCER CENTER
Basophil #: 0.1 x10 3/mm (ref 0.0–0.1)
Eosinophil #: 0.1 x10 3/mm (ref 0.0–0.7)
Eosinophil %: 2 %
HCT: 37.9 % (ref 35.0–47.0)
Lymphocyte #: 1 x10 3/mm (ref 1.0–3.6)
MCHC: 32.9 g/dL (ref 32.0–36.0)
MCV: 94 fL (ref 80–100)
Monocyte %: 10 %
Neutrophil #: 2.6 x10 3/mm (ref 1.4–6.5)
Platelet: 274 x10 3/mm (ref 150–440)
RDW: 15.5 % — ABNORMAL HIGH (ref 11.5–14.5)
WBC: 4.1 x10 3/mm (ref 3.6–11.0)

## 2012-02-27 ENCOUNTER — Ambulatory Visit: Payer: Self-pay | Admitting: General Surgery

## 2012-03-07 ENCOUNTER — Ambulatory Visit: Payer: Self-pay | Admitting: Internal Medicine

## 2012-03-08 ENCOUNTER — Emergency Department: Payer: Self-pay | Admitting: *Deleted

## 2012-03-08 LAB — URINALYSIS, COMPLETE
Bacteria: NONE SEEN
Glucose,UR: NEGATIVE mg/dL (ref 0–75)
Nitrite: NEGATIVE
Protein: NEGATIVE
RBC,UR: 2 /HPF (ref 0–5)
WBC UR: 4 /HPF (ref 0–5)

## 2012-03-15 ENCOUNTER — Ambulatory Visit: Payer: Self-pay | Admitting: Family Medicine

## 2012-03-18 LAB — CBC CANCER CENTER
Basophil %: 0.4 %
Eosinophil %: 0.9 %
HCT: 43.1 % (ref 35.0–47.0)
Lymphocyte #: 1.3 x10 3/mm (ref 1.0–3.6)
MCH: 31 pg (ref 26.0–34.0)
MCV: 94 fL (ref 80–100)
Monocyte #: 0.7 x10 3/mm (ref 0.2–0.9)
Monocyte %: 7.3 %
Neutrophil #: 7.2 x10 3/mm — ABNORMAL HIGH (ref 1.4–6.5)
Platelet: 279 x10 3/mm (ref 150–440)
RBC: 4.61 10*6/uL (ref 3.80–5.20)
WBC: 9.3 x10 3/mm (ref 3.6–11.0)

## 2012-03-25 LAB — CBC CANCER CENTER
Basophil #: 0.1 x10 3/mm (ref 0.0–0.1)
Eosinophil #: 0.1 x10 3/mm (ref 0.0–0.7)
Eosinophil %: 0.6 %
HCT: 43.3 % (ref 35.0–47.0)
HGB: 14.2 g/dL (ref 12.0–16.0)
Lymphocyte #: 0.6 x10 3/mm — ABNORMAL LOW (ref 1.0–3.6)
MCHC: 32.7 g/dL (ref 32.0–36.0)
MCV: 93 fL (ref 80–100)
Monocyte %: 5.1 %
Neutrophil #: 9.6 x10 3/mm — ABNORMAL HIGH (ref 1.4–6.5)
Platelet: 266 x10 3/mm (ref 150–440)
RBC: 4.66 10*6/uL (ref 3.80–5.20)
RDW: 14.9 % — ABNORMAL HIGH (ref 11.5–14.5)
WBC: 10.9 x10 3/mm (ref 3.6–11.0)

## 2012-04-01 LAB — CBC CANCER CENTER
Basophil #: 0.1 x10 3/mm (ref 0.0–0.1)
Basophil %: 1.4 %
HCT: 43.6 % (ref 35.0–47.0)
Lymphocyte %: 16.2 %
MCH: 30 pg (ref 26.0–34.0)
MCV: 93 fL (ref 80–100)
Monocyte %: 13.6 %
Neutrophil #: 3.1 x10 3/mm (ref 1.4–6.5)
RBC: 4.7 10*6/uL (ref 3.80–5.20)
RDW: 14.5 % (ref 11.5–14.5)
WBC: 4.7 x10 3/mm (ref 3.6–11.0)

## 2012-04-07 ENCOUNTER — Ambulatory Visit: Payer: Self-pay | Admitting: Internal Medicine

## 2012-04-09 LAB — CBC CANCER CENTER
Basophil %: 1.4 %
Eosinophil #: 0.1 x10 3/mm (ref 0.0–0.7)
Eosinophil %: 3.4 %
HCT: 43.5 % (ref 35.0–47.0)
HGB: 14.1 g/dL (ref 12.0–16.0)
Lymphocyte %: 15 %
MCHC: 32.5 g/dL (ref 32.0–36.0)
Monocyte #: 0.5 x10 3/mm (ref 0.2–0.9)
Neutrophil #: 2.6 x10 3/mm (ref 1.4–6.5)
Neutrophil %: 68.2 %
RBC: 4.7 10*6/uL (ref 3.80–5.20)
WBC: 3.8 x10 3/mm (ref 3.6–11.0)

## 2012-04-15 LAB — CBC CANCER CENTER
Basophil %: 1.2 %
Eosinophil %: 3.6 %
HGB: 14.3 g/dL (ref 12.0–16.0)
MCH: 30.2 pg (ref 26.0–34.0)
MCV: 92 fL (ref 80–100)
Monocyte #: 0.8 x10 3/mm (ref 0.2–0.9)
Monocyte %: 15.9 %
Neutrophil %: 63.9 %
RBC: 4.72 10*6/uL (ref 3.80–5.20)
WBC: 4.7 x10 3/mm (ref 3.6–11.0)

## 2012-04-19 ENCOUNTER — Ambulatory Visit: Payer: Self-pay | Admitting: Family Medicine

## 2012-04-23 LAB — CBC CANCER CENTER
Basophil %: 1.3 %
Eosinophil %: 3.7 %
HCT: 43.6 % (ref 35.0–47.0)
HGB: 14.4 g/dL (ref 12.0–16.0)
Lymphocyte %: 14.9 %
MCHC: 33.1 g/dL (ref 32.0–36.0)
Monocyte #: 0.7 x10 3/mm (ref 0.2–0.9)
Monocyte %: 15.8 %
Neutrophil #: 3 x10 3/mm (ref 1.4–6.5)
Neutrophil %: 64.3 %
RBC: 4.78 10*6/uL (ref 3.80–5.20)
RDW: 14.7 % — ABNORMAL HIGH (ref 11.5–14.5)
WBC: 4.6 x10 3/mm (ref 3.6–11.0)

## 2012-04-29 LAB — COMPREHENSIVE METABOLIC PANEL
Alkaline Phosphatase: 101 U/L (ref 50–136)
Anion Gap: 8 (ref 7–16)
BUN: 10 mg/dL (ref 7–18)
Bilirubin,Total: 0.2 mg/dL (ref 0.2–1.0)
Calcium, Total: 9.1 mg/dL (ref 8.5–10.1)
Chloride: 102 mmol/L (ref 98–107)
Co2: 27 mmol/L (ref 21–32)
Creatinine: 1.06 mg/dL (ref 0.60–1.30)
EGFR (Non-African Amer.): 53 — ABNORMAL LOW
Osmolality: 281 (ref 275–301)
Potassium: 3.7 mmol/L (ref 3.5–5.1)
Sodium: 137 mmol/L (ref 136–145)
Total Protein: 7.2 g/dL (ref 6.4–8.2)

## 2012-04-29 LAB — CBC CANCER CENTER
Basophil %: 1.3 %
Eosinophil %: 3.3 %
HGB: 14 g/dL (ref 12.0–16.0)
Lymphocyte #: 0.6 x10 3/mm — ABNORMAL LOW (ref 1.0–3.6)
MCV: 91 fL (ref 80–100)
Monocyte #: 0.5 x10 3/mm (ref 0.2–0.9)
Platelet: 210 x10 3/mm (ref 150–440)
RBC: 4.7 10*6/uL (ref 3.80–5.20)
WBC: 4.3 x10 3/mm (ref 3.6–11.0)

## 2012-05-07 ENCOUNTER — Ambulatory Visit: Payer: Self-pay | Admitting: Internal Medicine

## 2012-05-20 LAB — CBC CANCER CENTER
Basophil #: 0 x10 3/mm (ref 0.0–0.1)
Eosinophil #: 0.1 x10 3/mm (ref 0.0–0.7)
HGB: 14.4 g/dL (ref 12.0–16.0)
Lymphocyte #: 0.8 x10 3/mm — ABNORMAL LOW (ref 1.0–3.6)
MCH: 30.2 pg (ref 26.0–34.0)
MCHC: 33.1 g/dL (ref 32.0–36.0)
MCV: 91 fL (ref 80–100)
Monocyte #: 0.4 x10 3/mm (ref 0.2–0.9)
Platelet: 217 x10 3/mm (ref 150–440)
RBC: 4.76 10*6/uL (ref 3.80–5.20)

## 2012-06-07 ENCOUNTER — Ambulatory Visit: Payer: Self-pay | Admitting: Internal Medicine

## 2012-06-07 HISTORY — PX: BREAST SURGERY: SHX581

## 2012-06-10 LAB — CBC CANCER CENTER
Basophil #: 0 x10 3/mm (ref 0.0–0.1)
Basophil %: 0.9 %
HCT: 44.2 % (ref 35.0–47.0)
HGB: 14.8 g/dL (ref 12.0–16.0)
Lymphocyte #: 1.2 x10 3/mm (ref 1.0–3.6)
MCH: 30.3 pg (ref 26.0–34.0)
MCV: 90 fL (ref 80–100)
Monocyte %: 10.1 %
Neutrophil #: 3.2 x10 3/mm (ref 1.4–6.5)
Platelet: 218 x10 3/mm (ref 150–440)
RDW: 14.1 % (ref 11.5–14.5)
WBC: 5.2 x10 3/mm (ref 3.6–11.0)

## 2012-07-01 LAB — COMPREHENSIVE METABOLIC PANEL
Albumin: 3.3 g/dL — ABNORMAL LOW (ref 3.4–5.0)
Alkaline Phosphatase: 97 U/L (ref 50–136)
Anion Gap: 10 (ref 7–16)
BUN: 10 mg/dL (ref 7–18)
Chloride: 105 mmol/L (ref 98–107)
Glucose: 157 mg/dL — ABNORMAL HIGH (ref 65–99)
Osmolality: 284 (ref 275–301)
Potassium: 3.7 mmol/L (ref 3.5–5.1)
Sodium: 141 mmol/L (ref 136–145)
Total Protein: 7.1 g/dL (ref 6.4–8.2)

## 2012-07-01 LAB — CBC CANCER CENTER
HCT: 42 % (ref 35.0–47.0)
HGB: 14.3 g/dL (ref 12.0–16.0)
Lymphocyte %: 19.5 %
MCH: 30.6 pg (ref 26.0–34.0)
MCHC: 34.1 g/dL (ref 32.0–36.0)
Monocyte %: 8.9 %
Neutrophil #: 3.4 x10 3/mm (ref 1.4–6.5)
Neutrophil %: 67.8 %
Platelet: 221 x10 3/mm (ref 150–440)

## 2012-07-07 ENCOUNTER — Ambulatory Visit: Payer: Self-pay | Admitting: Internal Medicine

## 2012-07-22 LAB — CBC CANCER CENTER
Basophil %: 1 %
Eosinophil %: 3.2 %
HCT: 42.4 % (ref 35.0–47.0)
HGB: 14.5 g/dL (ref 12.0–16.0)
Lymphocyte #: 1.1 x10 3/mm (ref 1.0–3.6)
MCH: 30.8 pg (ref 26.0–34.0)
MCHC: 34.3 g/dL (ref 32.0–36.0)
MCV: 90 fL (ref 80–100)
Monocyte %: 6.9 %
Neutrophil %: 64.7 %
Platelet: 223 x10 3/mm (ref 150–440)
RBC: 4.73 10*6/uL (ref 3.80–5.20)
WBC: 4.7 x10 3/mm (ref 3.6–11.0)

## 2012-08-07 ENCOUNTER — Ambulatory Visit: Payer: Self-pay | Admitting: Internal Medicine

## 2012-08-12 ENCOUNTER — Ambulatory Visit: Payer: Self-pay | Admitting: General Surgery

## 2012-08-12 LAB — CBC CANCER CENTER
Eosinophil %: 2.8 %
HGB: 14.6 g/dL (ref 12.0–16.0)
Lymphocyte #: 1.3 x10 3/mm (ref 1.0–3.6)
Lymphocyte %: 20.3 %
MCH: 30.8 pg (ref 26.0–34.0)
MCV: 91 fL (ref 80–100)
Monocyte #: 0.4 x10 3/mm (ref 0.2–0.9)
Monocyte %: 7.1 %
Neutrophil #: 4.3 x10 3/mm (ref 1.4–6.5)
RDW: 13.7 % (ref 11.5–14.5)

## 2012-09-02 LAB — CBC CANCER CENTER
Basophil %: 1.1 %
HCT: 44.2 % (ref 35.0–47.0)
HGB: 14.8 g/dL (ref 12.0–16.0)
Lymphocyte #: 1.4 x10 3/mm (ref 1.0–3.6)
Lymphocyte %: 17 %
MCH: 30.5 pg (ref 26.0–34.0)
MCHC: 33.4 g/dL (ref 32.0–36.0)
Monocyte %: 7 %
Neutrophil %: 73.2 %
RBC: 4.84 10*6/uL (ref 3.80–5.20)
RDW: 13.3 % (ref 11.5–14.5)
WBC: 8.3 x10 3/mm (ref 3.6–11.0)

## 2012-09-02 LAB — COMPREHENSIVE METABOLIC PANEL
Albumin: 3.3 g/dL — ABNORMAL LOW (ref 3.4–5.0)
Bilirubin,Total: 0.3 mg/dL (ref 0.2–1.0)
Chloride: 100 mmol/L (ref 98–107)
EGFR (Non-African Amer.): >60
Glucose: 147 mg/dL — ABNORMAL HIGH (ref 65–99)
Osmolality: 279 (ref 275–301)
Potassium: 4.1 mmol/L (ref 3.5–5.1)
SGOT(AST): 18 U/L (ref 15–37)
SGPT (ALT): 32 U/L (ref 12–78)
Total Protein: 7.1 g/dL (ref 6.4–8.2)

## 2012-09-07 ENCOUNTER — Ambulatory Visit: Payer: Self-pay | Admitting: Internal Medicine

## 2012-09-18 ENCOUNTER — Encounter: Payer: Self-pay | Admitting: Physical Medicine and Rehabilitation

## 2012-09-23 LAB — CBC CANCER CENTER
Basophil #: 0.1 x10 3/mm (ref 0.0–0.1)
Basophil %: 1.1 %
Eosinophil #: 0.1 x10 3/mm (ref 0.0–0.7)
Eosinophil %: 2.7 %
HCT: 43.3 % (ref 35.0–47.0)
HGB: 14.8 g/dL (ref 12.0–16.0)
MCHC: 34.3 g/dL (ref 32.0–36.0)
MCV: 90 fL (ref 80–100)
Neutrophil #: 3.5 x10 3/mm (ref 1.4–6.5)
RBC: 4.8 10*6/uL (ref 3.80–5.20)
RDW: 13.4 % (ref 11.5–14.5)

## 2012-10-05 ENCOUNTER — Ambulatory Visit: Payer: Self-pay | Admitting: Internal Medicine

## 2012-10-05 ENCOUNTER — Encounter: Payer: Self-pay | Admitting: Physical Medicine and Rehabilitation

## 2012-10-14 LAB — CBC CANCER CENTER
Basophil %: 1.4 %
Lymphocyte #: 1.1 x10 3/mm (ref 1.0–3.6)
Lymphocyte %: 25.5 %
MCH: 30.4 pg (ref 26.0–34.0)
MCHC: 33.7 g/dL (ref 32.0–36.0)
Monocyte #: 0.5 x10 3/mm (ref 0.2–0.9)
Monocyte %: 11.3 %
Neutrophil #: 2.6 x10 3/mm (ref 1.4–6.5)
Platelet: 236 x10 3/mm (ref 150–440)
RDW: 13.4 % (ref 11.5–14.5)

## 2012-11-04 LAB — CBC CANCER CENTER
Basophil %: 1.2 %
Eosinophil %: 4.6 %
HCT: 43.2 % (ref 35.0–47.0)
Lymphocyte %: 24.8 %
MCH: 31 pg (ref 26.0–34.0)
MCV: 91 fL (ref 80–100)
Monocyte %: 11.4 %
Neutrophil #: 2.6 x10 3/mm (ref 1.4–6.5)

## 2012-11-04 LAB — COMPREHENSIVE METABOLIC PANEL
Albumin: 3.4 g/dL (ref 3.4–5.0)
Alkaline Phosphatase: 92 U/L (ref 50–136)
Anion Gap: 6 — ABNORMAL LOW (ref 7–16)
BUN: 12 mg/dL (ref 7–18)
Bilirubin,Total: 0.2 mg/dL (ref 0.2–1.0)
Calcium, Total: 8.7 mg/dL (ref 8.5–10.1)
Co2: 28 mmol/L (ref 21–32)
Potassium: 3.8 mmol/L (ref 3.5–5.1)
SGOT(AST): 24 U/L (ref 15–37)
SGPT (ALT): 26 U/L (ref 12–78)

## 2012-11-05 ENCOUNTER — Ambulatory Visit: Payer: Self-pay | Admitting: Internal Medicine

## 2012-11-25 LAB — CBC CANCER CENTER
Basophil #: 0.1 x10 3/mm (ref 0.0–0.1)
Basophil %: 1.2 %
Eosinophil #: 0.2 x10 3/mm (ref 0.0–0.7)
Eosinophil %: 3.7 %
HCT: 43.8 % (ref 35.0–47.0)
HGB: 15 g/dL (ref 12.0–16.0)
Lymphocyte #: 1.1 x10 3/mm (ref 1.0–3.6)
Lymphocyte %: 25.2 %
MCH: 31.1 pg (ref 26.0–34.0)
MCV: 91 fL (ref 80–100)
Monocyte #: 0.3 x10 3/mm (ref 0.2–0.9)
Monocyte %: 7.1 %
Neutrophil #: 2.6 x10 3/mm (ref 1.4–6.5)
RBC: 4.81 10*6/uL (ref 3.80–5.20)
RDW: 13.2 % (ref 11.5–14.5)
WBC: 4.2 x10 3/mm (ref 3.6–11.0)

## 2012-12-05 ENCOUNTER — Ambulatory Visit: Payer: Self-pay | Admitting: Internal Medicine

## 2012-12-23 LAB — COMPREHENSIVE METABOLIC PANEL
Albumin: 3.6 g/dL (ref 3.4–5.0)
Alkaline Phosphatase: 100 U/L (ref 50–136)
Anion Gap: 9 (ref 7–16)
BUN: 9 mg/dL (ref 7–18)
Calcium, Total: 9.8 mg/dL (ref 8.5–10.1)
Chloride: 101 mmol/L (ref 98–107)
Co2: 30 mmol/L (ref 21–32)
Creatinine: 1.13 mg/dL (ref 0.60–1.30)
EGFR (African American): 57 — ABNORMAL LOW
EGFR (Non-African Amer.): 49 — ABNORMAL LOW
Glucose: 168 mg/dL — ABNORMAL HIGH (ref 65–99)
Osmolality: 282 (ref 275–301)
Potassium: 4.7 mmol/L (ref 3.5–5.1)
SGOT(AST): 23 U/L (ref 15–37)
SGPT (ALT): 33 U/L (ref 12–78)
Sodium: 140 mmol/L (ref 136–145)
Total Protein: 8 g/dL (ref 6.4–8.2)

## 2012-12-23 LAB — CBC CANCER CENTER
Basophil #: 0.1 x10 3/mm (ref 0.0–0.1)
Eosinophil #: 0.1 x10 3/mm (ref 0.0–0.7)
Eosinophil %: 3 %
HCT: 46 % (ref 35.0–47.0)
HGB: 15.7 g/dL (ref 12.0–16.0)
Lymphocyte #: 1.1 x10 3/mm (ref 1.0–3.6)
Lymphocyte %: 21.6 %
Monocyte #: 0.4 x10 3/mm (ref 0.2–0.9)
Monocyte %: 7.3 %
Neutrophil #: 3.4 x10 3/mm (ref 1.4–6.5)
RBC: 5.06 10*6/uL (ref 3.80–5.20)
RDW: 13.1 % (ref 11.5–14.5)

## 2012-12-23 LAB — URIC ACID: Uric Acid: 4 mg/dL (ref 2.6–6.0)

## 2013-01-05 ENCOUNTER — Ambulatory Visit: Payer: Self-pay | Admitting: Internal Medicine

## 2013-01-24 ENCOUNTER — Ambulatory Visit: Payer: Self-pay | Admitting: Physical Medicine and Rehabilitation

## 2013-02-04 ENCOUNTER — Ambulatory Visit: Payer: Self-pay | Admitting: Internal Medicine

## 2013-03-07 ENCOUNTER — Ambulatory Visit: Payer: Self-pay | Admitting: Internal Medicine

## 2013-03-11 ENCOUNTER — Ambulatory Visit: Payer: Self-pay | Admitting: General Surgery

## 2013-03-13 ENCOUNTER — Encounter: Payer: Self-pay | Admitting: General Surgery

## 2013-03-17 LAB — CBC CANCER CENTER
Basophil %: 1.3 %
HGB: 14.7 g/dL (ref 12.0–16.0)
Lymphocyte %: 27.6 %
MCHC: 34.7 g/dL (ref 32.0–36.0)
MCV: 90 fL (ref 80–100)
Monocyte #: 0.3 x10 3/mm (ref 0.2–0.9)
Monocyte %: 7.9 %
Neutrophil #: 2.6 x10 3/mm (ref 1.4–6.5)
Neutrophil %: 58.3 %
RBC: 4.72 10*6/uL (ref 3.80–5.20)

## 2013-03-17 LAB — HEPATIC FUNCTION PANEL A (ARMC)
Alkaline Phosphatase: 89 U/L (ref 50–136)
Bilirubin,Total: 0.2 mg/dL (ref 0.2–1.0)

## 2013-03-17 LAB — CREATININE, SERUM
Creatinine: 0.84 mg/dL (ref 0.60–1.30)
EGFR (Non-African Amer.): >60

## 2013-03-18 LAB — CANCER ANTIGEN 27.29: CA 27.29: 14.9 U/mL (ref 0.0–38.6)

## 2013-03-31 ENCOUNTER — Encounter: Payer: Self-pay | Admitting: General Surgery

## 2013-03-31 ENCOUNTER — Ambulatory Visit (INDEPENDENT_AMBULATORY_CARE_PROVIDER_SITE_OTHER): Payer: Medicare Other | Admitting: General Surgery

## 2013-03-31 VITALS — BP 134/88 | HR 78 | Resp 14 | Wt 221.0 lb

## 2013-03-31 DIAGNOSIS — C50412 Malignant neoplasm of upper-outer quadrant of left female breast: Secondary | ICD-10-CM

## 2013-03-31 DIAGNOSIS — C50919 Malignant neoplasm of unspecified site of unspecified female breast: Secondary | ICD-10-CM

## 2013-03-31 DIAGNOSIS — C50419 Malignant neoplasm of upper-outer quadrant of unspecified female breast: Secondary | ICD-10-CM

## 2013-03-31 DIAGNOSIS — C50912 Malignant neoplasm of unspecified site of left female breast: Secondary | ICD-10-CM

## 2013-03-31 NOTE — Progress Notes (Signed)
Patient ID: Leah Schaefer, female   DOB: [birth year], 73 y.o.   MRN: [id number]  Chief Complaint  Patient presents with  . Follow-up    mammogram    HPI Leah Schaefer is a 72 y.o. female here for follow up for a unilateral left diagnostic mammogram done at [facility] on 03/11/13. She denies any breast discharge, lumps, or bumps. She does state that she will get some mild pain in the left breast occasionally that is very short in duration. HPI  Past Medical History  Diagnosis Date  . Hernia 2011  . Cystitis   . 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 Personal history of malignant neoplasm of breast   . 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 Colonoscopy  2011    Dr Bluford Kaufmann  . Hernia repair Right 2009    inguinal, and umbilical  . Cholecystectomy  2007  . Dilation and curettage of uterus  2004  . Portacath placement  2013  . Breast surgery Left 2012    wide excision  . Upper gi endoscopy  2011    Family History  Problem Relation Age of Onset  . Ovarian cancer Sister   . Lymphoma Father   . Colon cancer Brother   . Lymphoma Brother     Social History History  Substance Use Topics  . Smoking status: Former Smoker    Types: Cigarettes  . Smokeless tobacco: Never Used  . Alcohol Use: No    No Known Allergies  Current Outpatient Prescriptions  Medication Sig Dispense Refill  . acetaminophen (TYLENOL) 500 MG tablet Take 500 mg by mouth every 6 (six) hours as needed for pain.      . Ascorbic Acid (VITAMIN C) 100 MG tablet Take 100 mg by mouth daily.      . Calcium Carbonate-Vitamin D (CALCIUM 600 + D PO) Take 1 capsule by mouth daily.      Marland Kitchen escitalopram (LEXAPRO) 20 MG tablet Take 20 mg by mouth daily.      . fish oil-omega-3 fatty acids 1000 MG capsule Take 2 g by mouth daily.      Marland Kitchen gabapentin (NEURONTIN) 300 MG capsule Take 300 mg by mouth 2 (two) times  daily.      Marland Kitchen HYDROcodone-acetaminophen (NORCO) 7.5-325 MG per tablet Take 1 tablet by mouth every 6 (six) hours as needed for pain.      . meloxicam (MOBIC) 15 MG tablet Take 15 mg by mouth daily.      . metoprolol tartrate (LOPRESSOR) 25 MG tablet Take 25 mg by mouth 2 (two) times daily.      . Multiple Vitamins-Minerals (CENTRUM SILVER ULTRA WOMENS PO) Take 1 tablet by mouth daily.      . Niacin (VITAMIN B-3 PO) Take 1,000 mg by mouth.      Marland Kitchen omeprazole (PRILOSEC) 20 MG capsule Take 20 mg by mouth 2 (two) times daily.      Marland Kitchen pyridoxine (B-6) 100 MG tablet Take 100 mg by mouth daily.      Jeananne Rama Sulfate (EYE DROPS A/C OP) Apply to eye.      . vitamin B-12 (CYANOCOBALAMIN) 1000 MCG tablet Take 1,000 mcg by mouth daily.      . vitamin E 400 UNIT capsule Take 400 Units by mouth daily.       No current facility-administered  medications for this visit.    Review of Systems Review of Systems  Constitutional: Negative.   Respiratory: Negative.   Cardiovascular: Negative.     Blood pressure 134/88, pulse 78, resp. rate 14, weight 221 lb (100.245 kg).  Physical Exam Physical Exam  Constitutional: She is oriented to person, place, and time. She appears well-developed and well-nourished.  Neck: Neck supple.  Cardiovascular: Normal rate, regular rhythm and normal heart sounds.   Pulmonary/Chest: Effort normal and breath sounds normal. Right breast exhibits no inverted nipple, no mass, no nipple discharge, no skin change and no tenderness. Left breast exhibits skin change (slight thickening along the scar on the upper inner quadrant. very slight increased pigmentation ). Left breast exhibits no inverted nipple, no mass, no nipple discharge and no tenderness.  On the R breast depigmentation at the port site. There is also a 1 cm area of the pigmented skin in the L upper outer quadrant below the wide excision site.   Musculoskeletal:       Arms: Lymphadenopathy:    She has no  cervical adenopathy.  Neurological: She is alert and oriented to person, place, and time.    Data Reviewed Mammogram of the left breast dated 03/11/2013 showed postsurgical changes. Dystrophic calcifications. BI-RAD-3.  Assessment    Doing well status post treatment of her left breast cancer now 18 months status post wide excision and sentinel node biopsy. No evidence of axillary recurrence.    Plan    We'll plan for a followup examination with bilateral diagnostic mammograms in 6 months.       Earline Mayotte 04/01/2013, 11:21 AM

## 2013-04-01 ENCOUNTER — Encounter: Payer: Self-pay | Admitting: General Surgery

## 2013-04-07 ENCOUNTER — Ambulatory Visit: Payer: Self-pay | Admitting: Internal Medicine

## 2013-04-10 ENCOUNTER — Encounter: Payer: Self-pay | Admitting: General Surgery

## 2013-04-25 ENCOUNTER — Encounter: Payer: Self-pay | Admitting: General Surgery

## 2013-05-07 ENCOUNTER — Ambulatory Visit: Payer: Self-pay | Admitting: Internal Medicine

## 2013-06-07 ENCOUNTER — Ambulatory Visit: Payer: Self-pay | Admitting: Internal Medicine

## 2013-07-07 ENCOUNTER — Ambulatory Visit: Payer: Self-pay | Admitting: Internal Medicine

## 2013-08-07 ENCOUNTER — Ambulatory Visit: Payer: Self-pay | Admitting: Internal Medicine

## 2013-09-07 ENCOUNTER — Ambulatory Visit: Payer: Self-pay | Admitting: Internal Medicine

## 2013-09-12 ENCOUNTER — Ambulatory Visit: Payer: Self-pay | Admitting: General Surgery

## 2013-09-12 ENCOUNTER — Encounter: Payer: Self-pay | Admitting: General Surgery

## 2013-09-19 ENCOUNTER — Ambulatory Visit: Payer: Medicare HMO | Admitting: Podiatry

## 2013-09-24 LAB — CREATININE, SERUM
Creatinine: 1.18 mg/dL (ref 0.60–1.30)
EGFR (African American): 54 — ABNORMAL LOW
EGFR (Non-African Amer.): 46 — ABNORMAL LOW

## 2013-09-24 LAB — CBC CANCER CENTER
BASOS PCT: 1 %
Basophil #: 0.1 x10 3/mm (ref 0.0–0.1)
Eosinophil #: 0.2 x10 3/mm (ref 0.0–0.7)
Eosinophil %: 3.9 %
HCT: 44.2 % (ref 35.0–47.0)
HGB: 15.2 g/dL (ref 12.0–16.0)
LYMPHS PCT: 29.9 %
Lymphocyte #: 1.6 x10 3/mm (ref 1.0–3.6)
MCH: 31.3 pg (ref 26.0–34.0)
MCHC: 34.4 g/dL (ref 32.0–36.0)
MCV: 91 fL (ref 80–100)
Monocyte #: 0.3 x10 3/mm (ref 0.2–0.9)
Monocyte %: 6.4 %
Neutrophil #: 3.1 x10 3/mm (ref 1.4–6.5)
Neutrophil %: 58.8 %
Platelet: 237 x10 3/mm (ref 150–440)
RBC: 4.85 10*6/uL (ref 3.80–5.20)
RDW: 12.8 % (ref 11.5–14.5)
WBC: 5.3 x10 3/mm (ref 3.6–11.0)

## 2013-09-24 LAB — HEPATIC FUNCTION PANEL A (ARMC)
ALK PHOS: 77 U/L
Albumin: 3.5 g/dL (ref 3.4–5.0)
Bilirubin, Direct: 0.1 mg/dL (ref 0.00–0.20)
Bilirubin,Total: 0.3 mg/dL (ref 0.2–1.0)
SGOT(AST): 24 U/L (ref 15–37)
SGPT (ALT): 36 U/L (ref 12–78)
TOTAL PROTEIN: 7.3 g/dL (ref 6.4–8.2)

## 2013-09-25 ENCOUNTER — Encounter: Payer: Self-pay | Admitting: General Surgery

## 2013-09-25 ENCOUNTER — Ambulatory Visit (INDEPENDENT_AMBULATORY_CARE_PROVIDER_SITE_OTHER): Payer: Medicare HMO | Admitting: General Surgery

## 2013-09-25 VITALS — BP 130/84 | HR 86 | Resp 14 | Ht 67.0 in | Wt 228.0 lb

## 2013-09-25 DIAGNOSIS — C50919 Malignant neoplasm of unspecified site of unspecified female breast: Secondary | ICD-10-CM

## 2013-09-25 DIAGNOSIS — C50912 Malignant neoplasm of unspecified site of left female breast: Secondary | ICD-10-CM

## 2013-09-25 LAB — CANCER ANTIGEN 27.29: CA 27.29: 17.8 U/mL (ref 0.0–38.6)

## 2013-09-25 NOTE — Progress Notes (Signed)
Patient ID: Leah Schaefer, female   DOB: [birth year], 73 y.o.   MRN: [id number]  Chief Complaint  Patient presents with  . Follow-up    mammogram    HPI Leah Schaefer is a 73 y.o. female. who presents for a breast evaluation. The most recent mammogram was done on 09/12/13. Patient does perform regular self breast checks and gets regular mammograms done. Patient last chemo treatment in April 2014.   HPI  Past Medical History  Diagnosis Date  . Hernia 2011  . Cystitis   . 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 Personal history of malignant neoplasm of breast   . 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 Colonoscopy  2011    Dr Candace Cruise  . Hernia repair Right 2009    inguinal, and umbilical  . Cholecystectomy  2007  . Dilation and curettage of uterus  2004  . Portacath placement  2013  . Upper gi endoscopy  2011  . Breast surgery Left 2012    wide excision  . Breast surgery Left November 2013    Core biopsy of the upper-outer quadrant showed fat necrosis.    Family History  Problem Relation Age of Onset  . Ovarian cancer Sister   . Lymphoma Father   . Colon cancer Brother   . Lymphoma Brother     Social History History  Substance Use Topics  . Smoking status: Former Smoker    Types: Cigarettes  . Smokeless tobacco: Never Used  . Alcohol Use: No    No Known Allergies  Current Outpatient Prescriptions  Medication Sig Dispense Refill  . acetaminophen (TYLENOL) 500 MG tablet Take 500 mg by mouth every 6 (six) hours as needed for pain.      . Ascorbic Acid (VITAMIN C) 100 MG tablet Take 100 mg by mouth daily.      Marland Kitchen aspirin 81 MG tablet Take 81 mg by mouth daily.      Marland Kitchen atorvastatin (LIPITOR) 10 MG tablet Take 10 mg by mouth daily.      . bimatoprost (LUMIGAN) 0.01 % SOLN 1 drop at bedtime.      . Calcium Carbonate-Vitamin D (CALCIUM 600 + D PO)  Take 1 capsule by mouth daily.      Marland Kitchen escitalopram (LEXAPRO) 20 MG tablet Take 20 mg by mouth daily.      . fish oil-omega-3 fatty acids 1000 MG capsule Take 2 g by mouth daily.      Marland Kitchen gabapentin (NEURONTIN) 300 MG capsule Take 300 mg by mouth 2 (two) times daily.      Marland Kitchen HYDROcodone-acetaminophen (NORCO) 7.5-325 MG per tablet Take 1 tablet by mouth every 6 (six) hours as needed for pain.      . meloxicam (MOBIC) 15 MG tablet Take 15 mg by mouth daily.      . metoprolol tartrate (LOPRESSOR) 25 MG tablet Take 25 mg by mouth 2 (two) times daily.      . Multiple Vitamins-Minerals (CENTRUM SILVER ULTRA WOMENS PO) Take 1 tablet by mouth daily.      . Niacin (VITAMIN B-3 PO) Take 1,000 mg by mouth.      Marland Kitchen omeprazole (PRILOSEC) 20 MG capsule Take 20 mg by mouth 2 (two) times daily.      Marland Kitchen pyridoxine (B-6) 100 MG tablet Take 100 mg by mouth daily.      Marland Kitchen  Tetrahydrozoline-Zn Sulfate (EYE DROPS A/C OP) Apply to eye.      . vitamin B-12 (CYANOCOBALAMIN) 1000 MCG tablet Take 1,000 mcg by mouth daily.      . vitamin E 400 UNIT capsule Take 400 Units by mouth daily.       No current facility-administered medications for this visit.    Review of Systems Review of Systems  Constitutional: Negative.   Respiratory: Negative.   Cardiovascular: Negative.     Blood pressure 130/84, pulse 86, resp. rate 14, height 5\' 7"  (1.702 m), weight 228 lb (103.42 kg).  Physical Exam Physical Exam  Constitutional: She is oriented to person, place, and time. She appears well-developed and well-nourished.  Eyes: Conjunctivae are normal.  Neck: Neck supple.  Cardiovascular: Normal rate, regular rhythm and normal heart sounds.   Pulmonary/Chest: Breath sounds normal. Right breast exhibits no inverted nipple, no mass, no nipple discharge, no skin change and no tenderness. Left breast exhibits no inverted nipple, no mass, no nipple discharge, no skin change and no tenderness.  Left breast 1-2 o'clock well healed incision,  Right breast 5 cm pigmentation inferior to the power port.  The patient reports this was secondary to the spray anesthetic used prior to port cannulation. My suspicion would have been slight extravasation of chemotherapeutic agents. The patient reports areas improving.  Lymphadenopathy:    She has no cervical adenopathy.    She has no axillary adenopathy.  Neurological: She is alert and oriented to person, place, and time.  Skin: Skin is warm and dry.    Data Reviewed Creatinine 0.87 with an estimated GFR greater than 60 11/04/2012. On 09/24/2013 creatinine was 1.18 with an estimated GFR of 54. The patient's creatinine an estimated GFR has been variable over the past year. Prior 2014 it was always reported to be above 60.  CA 27-29 normal at 17.8.  Mammogram dated 09/12/2013 was reviewed. Postsurgical changes identified. No interval change. BI-RAD-2.  Assessment    No evidence of recurrent or new breast cancer.  New decrease in renal function.     Plan    The patient has been making use of Mobic for several years in regards to arthritis. She will need to touch base with her PCP to determine at this should be continued in light of her recent change in renal function.  Arrangements were made for followup exam and bilateral mammograms in one year.       Robert Bellow 09/26/2013, 12:22 PM

## 2013-09-25 NOTE — Patient Instructions (Signed)
Patient to return in one yaer bilateral diagnotic mammogram.

## 2013-09-30 ENCOUNTER — Encounter: Payer: Self-pay | Admitting: *Deleted

## 2013-10-01 ENCOUNTER — Encounter: Payer: Self-pay | Admitting: General Surgery

## 2013-10-03 ENCOUNTER — Ambulatory Visit: Payer: Self-pay | Admitting: Family Medicine

## 2013-10-05 ENCOUNTER — Ambulatory Visit: Payer: Self-pay | Admitting: Internal Medicine

## 2013-10-07 ENCOUNTER — Ambulatory Visit: Payer: Self-pay | Admitting: Family Medicine

## 2013-11-05 ENCOUNTER — Ambulatory Visit: Payer: Self-pay | Admitting: Internal Medicine

## 2013-11-05 ENCOUNTER — Ambulatory Visit: Payer: Self-pay | Admitting: Family Medicine

## 2013-12-05 ENCOUNTER — Ambulatory Visit: Payer: Self-pay | Admitting: Internal Medicine

## 2013-12-05 ENCOUNTER — Ambulatory Visit: Payer: Self-pay | Admitting: Family Medicine

## 2014-01-07 ENCOUNTER — Ambulatory Visit: Payer: Self-pay | Admitting: Internal Medicine

## 2014-02-04 ENCOUNTER — Ambulatory Visit: Payer: Self-pay | Admitting: Internal Medicine

## 2014-02-18 LAB — CBC CANCER CENTER
BASOS ABS: 0.1 x10 3/mm (ref 0.0–0.1)
BASOS PCT: 1.4 %
EOS PCT: 5.7 %
Eosinophil #: 0.2 x10 3/mm (ref 0.0–0.7)
HCT: 43.6 % (ref 35.0–47.0)
HGB: 14.6 g/dL (ref 12.0–16.0)
LYMPHS ABS: 1.4 x10 3/mm (ref 1.0–3.6)
LYMPHS PCT: 32.6 %
MCH: 30.7 pg (ref 26.0–34.0)
MCHC: 33.6 g/dL (ref 32.0–36.0)
MCV: 91 fL (ref 80–100)
Monocyte #: 0.3 x10 3/mm (ref 0.2–0.9)
Monocyte %: 8.2 %
NEUTROS ABS: 2.2 x10 3/mm (ref 1.4–6.5)
NEUTROS PCT: 52.1 %
Platelet: 202 x10 3/mm (ref 150–440)
RBC: 4.78 10*6/uL (ref 3.80–5.20)
RDW: 13 % (ref 11.5–14.5)
WBC: 4.2 x10 3/mm (ref 3.6–11.0)

## 2014-02-18 LAB — HEPATIC FUNCTION PANEL A (ARMC)
ALBUMIN: 3.4 g/dL (ref 3.4–5.0)
ALK PHOS: 91 U/L
Bilirubin, Direct: <0.1 mg/dL (ref 0.00–0.20)
Bilirubin,Total: 0.3 mg/dL (ref 0.2–1.0)
SGOT(AST): 21 U/L (ref 15–37)
SGPT (ALT): 32 U/L (ref 12–78)
TOTAL PROTEIN: 7.2 g/dL (ref 6.4–8.2)

## 2014-02-18 LAB — CREATININE, SERUM
CREATININE: 0.89 mg/dL (ref 0.60–1.30)
EGFR (Non-African Amer.): >60

## 2014-02-19 LAB — CANCER ANTIGEN 27.29: CA 27.29: 15 U/mL (ref 0.0–38.6)

## 2014-03-07 ENCOUNTER — Ambulatory Visit: Payer: Self-pay | Admitting: Internal Medicine

## 2014-03-16 ENCOUNTER — Encounter: Payer: Self-pay | Admitting: General Surgery

## 2014-03-16 ENCOUNTER — Ambulatory Visit (INDEPENDENT_AMBULATORY_CARE_PROVIDER_SITE_OTHER): Payer: Medicare HMO | Admitting: General Surgery

## 2014-03-16 VITALS — BP 138/88 | HR 80 | Resp 14 | Ht 67.0 in | Wt 222.0 lb

## 2014-03-16 DIAGNOSIS — C50419 Malignant neoplasm of upper-outer quadrant of unspecified female breast: Secondary | ICD-10-CM

## 2014-03-16 DIAGNOSIS — C50412 Malignant neoplasm of upper-outer quadrant of left female breast: Secondary | ICD-10-CM

## 2014-03-16 NOTE — Patient Instructions (Signed)
Patient to return in 1 week for nurse visit. The patient is aware to call back for any questions or concerns.  

## 2014-03-16 NOTE — Progress Notes (Signed)
Patient ID: Leah Schaefer, female   DOB: 08/30/1940, 73 y.o.   MRN: [id number]  Chief Complaint  Patient presents with  . Procedure    port removal    HPI Leah Schaefer is a 73 y.o. female here today for her port removal. Request for port removal has been obtained from Leah Schaefer, M.D. The procedure was reviewed with the patient. HPI  Past Medical History  Diagnosis Date  . Hernia 2011  . Cystitis   . 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 Personal history of malignant neoplasm of breast   . 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 Colonoscopy  2011    Dr Candace Cruise  . Hernia repair Right 2009    inguinal, and umbilical  . Cholecystectomy  2007  . Dilation and curettage of uterus  2004  . Portacath placement  2013  . Upper gi endoscopy  2011  . Breast surgery Left 2012    wide excision  . Breast surgery Left November 2013    Core biopsy of the upper-outer quadrant showed fat necrosis.    Family History  Problem Relation Age of Onset  . Ovarian cancer Sister   . Lymphoma Father   . Colon cancer Brother   . Lymphoma Brother     Social History History  Substance Use Topics  . Smoking status: Former Smoker    Types: Cigarettes  . Smokeless tobacco: Never Used  . Alcohol Use: No    No Known Allergies  Current Outpatient Prescriptions  Medication Sig Dispense Refill  . acetaminophen (TYLENOL) 500 MG tablet Take 500 mg by mouth every 6 (six) hours as needed for pain.      . Ascorbic Acid (VITAMIN C) 100 MG tablet Take 100 mg by mouth daily.      Marland Kitchen aspirin 81 MG tablet Take 81 mg by mouth daily.      Marland Kitchen atorvastatin (LIPITOR) 10 MG tablet Take 10 mg by mouth daily.      . bimatoprost (LUMIGAN) 0.01 % SOLN 1 drop at bedtime.      . Calcium Carbonate-Vitamin D (CALCIUM 600 + D PO) Take 1 capsule by mouth daily.      Marland Kitchen escitalopram (LEXAPRO) 20 MG  tablet Take 20 mg by mouth daily.      . fish oil-omega-3 fatty acids 1000 MG capsule Take 2 g by mouth daily.      Marland Kitchen gabapentin (NEURONTIN) 300 MG capsule Take 300 mg by mouth 2 (two) times daily.      Marland Kitchen HYDROcodone-acetaminophen (NORCO) 7.5-325 MG per tablet Take 1 tablet by mouth every 6 (six) hours as needed for pain.      . meloxicam (MOBIC) 15 MG tablet Take 15 mg by mouth daily.      . metoprolol tartrate (LOPRESSOR) 25 MG tablet Take 25 mg by mouth 2 (two) times daily.      . Multiple Vitamins-Minerals (CENTRUM SILVER ULTRA WOMENS PO) Take 1 tablet by mouth daily.      . Niacin (VITAMIN B-3 PO) Take 1,000 mg by mouth.      Marland Kitchen omeprazole (PRILOSEC) 20 MG capsule Take 20 mg by mouth 2 (two) times daily.      Marland Kitchen pyridoxine (B-6) 100 MG tablet Take 100 mg by mouth daily.      Bethann Humble Sulfate (EYE DROPS A/C  OP) Apply to eye.      . vitamin B-12 (CYANOCOBALAMIN) 1000 MCG tablet Take 1,000 mcg by mouth daily.      . vitamin E 400 UNIT capsule Take 400 Units by mouth daily.       No current facility-administered medications for this visit.    Review of Systems Review of Systems  Constitutional: Negative.   Respiratory: Negative.   Cardiovascular: Negative.     Blood pressure 138/88, pulse 80, resp. rate 14, height 5\' 7"  (1.702 m), weight 222 lb (100.699 kg).  Physical Exam Physical Exam Examination of the port site shows no evidence of infection or induration on the right chest.     Assessment    Completion of adjuvant chemotherapy status post breast cancer.     Plan    The area was prepped with alcohol and 10 cc of 0.5% Xylocaine with 0.25% Marcaine with 1-200,000 of epinephrine was utilized well-tolerated. This was supplemented with 2 cc of 1% Xylocaine plain. The original incision was opened and the port exposed. A single transfixion suture was evident. The port was removed and the catheter retrieved with an intact tip. No bleeding was evident. The deep tissue  was approximated with a running 3-0 Vicryl. The skin was closed with a running 4-0 Vicryl subcutaneous closed suture. Benzoin and Steri-Strips followed by Telfa Tegaderm dressing was applied. Ice pack provided. Post procedure wound care instructions provided.  The patient will followup for nursing check in one week.     PCP: Etheleen Mayhew 03/17/2014, 7:41 PM

## 2014-03-23 ENCOUNTER — Ambulatory Visit (INDEPENDENT_AMBULATORY_CARE_PROVIDER_SITE_OTHER): Payer: Self-pay | Admitting: *Deleted

## 2014-03-23 DIAGNOSIS — C50419 Malignant neoplasm of upper-outer quadrant of unspecified female breast: Secondary | ICD-10-CM

## 2014-03-23 DIAGNOSIS — C50412 Malignant neoplasm of upper-outer quadrant of left female breast: Secondary | ICD-10-CM

## 2014-03-23 NOTE — Progress Notes (Signed)
Patient came in today for a wound check port removal.  The wound is clean, with no signs of infection noted. Follow up as scheduled.

## 2014-05-07 ENCOUNTER — Ambulatory Visit: Payer: Self-pay | Admitting: Internal Medicine

## 2014-06-07 ENCOUNTER — Ambulatory Visit: Payer: Self-pay | Admitting: Internal Medicine

## 2014-06-08 ENCOUNTER — Encounter: Payer: Self-pay | Admitting: General Surgery

## 2014-08-10 DIAGNOSIS — I10 Essential (primary) hypertension: Secondary | ICD-10-CM | POA: Diagnosis not present

## 2014-08-10 DIAGNOSIS — M5430 Sciatica, unspecified side: Secondary | ICD-10-CM | POA: Diagnosis not present

## 2014-08-10 DIAGNOSIS — Z1389 Encounter for screening for other disorder: Secondary | ICD-10-CM | POA: Diagnosis not present

## 2014-08-10 DIAGNOSIS — I779 Disorder of arteries and arterioles, unspecified: Secondary | ICD-10-CM | POA: Diagnosis not present

## 2014-08-24 DIAGNOSIS — H4011X2 Primary open-angle glaucoma, moderate stage: Secondary | ICD-10-CM | POA: Diagnosis not present

## 2014-08-28 ENCOUNTER — Ambulatory Visit: Payer: Self-pay | Admitting: Internal Medicine

## 2014-09-03 DIAGNOSIS — Z1389 Encounter for screening for other disorder: Secondary | ICD-10-CM | POA: Diagnosis not present

## 2014-09-03 DIAGNOSIS — Z0000 Encounter for general adult medical examination without abnormal findings: Secondary | ICD-10-CM | POA: Diagnosis not present

## 2014-09-03 DIAGNOSIS — I779 Disorder of arteries and arterioles, unspecified: Secondary | ICD-10-CM | POA: Diagnosis not present

## 2014-09-03 DIAGNOSIS — E1142 Type 2 diabetes mellitus with diabetic polyneuropathy: Secondary | ICD-10-CM | POA: Diagnosis not present

## 2014-09-04 DIAGNOSIS — F419 Anxiety disorder, unspecified: Secondary | ICD-10-CM | POA: Diagnosis not present

## 2014-09-04 DIAGNOSIS — E669 Obesity, unspecified: Secondary | ICD-10-CM | POA: Diagnosis not present

## 2014-09-04 DIAGNOSIS — Z7982 Long term (current) use of aspirin: Secondary | ICD-10-CM | POA: Diagnosis not present

## 2014-09-04 DIAGNOSIS — F329 Major depressive disorder, single episode, unspecified: Secondary | ICD-10-CM | POA: Diagnosis not present

## 2014-09-04 DIAGNOSIS — H409 Unspecified glaucoma: Secondary | ICD-10-CM | POA: Diagnosis not present

## 2014-09-04 DIAGNOSIS — Z853 Personal history of malignant neoplasm of breast: Secondary | ICD-10-CM | POA: Diagnosis not present

## 2014-09-04 DIAGNOSIS — Z79899 Other long term (current) drug therapy: Secondary | ICD-10-CM | POA: Diagnosis not present

## 2014-09-04 DIAGNOSIS — G620 Drug-induced polyneuropathy: Secondary | ICD-10-CM | POA: Diagnosis not present

## 2014-09-04 DIAGNOSIS — H269 Unspecified cataract: Secondary | ICD-10-CM | POA: Diagnosis not present

## 2014-09-04 LAB — CBC CANCER CENTER
BASOS ABS: 0 x10 3/mm (ref 0.0–0.1)
BASOS PCT: 0.1 %
Eosinophil #: 0.2 x10 3/mm (ref 0.0–0.7)
Eosinophil %: 5.1 %
HCT: 44.9 % (ref 35.0–47.0)
HGB: 15.2 g/dL (ref 12.0–16.0)
LYMPHS ABS: 1.5 x10 3/mm (ref 1.0–3.6)
Lymphocyte %: 34.3 %
MCH: 30.7 pg (ref 26.0–34.0)
MCHC: 33.9 g/dL (ref 32.0–36.0)
MCV: 91 fL (ref 80–100)
MONO ABS: 0.4 x10 3/mm (ref 0.2–0.9)
Monocyte %: 8 %
Neutrophil #: 2.3 x10 3/mm (ref 1.4–6.5)
Neutrophil %: 52.5 %
PLATELETS: 201 x10 3/mm (ref 150–440)
RBC: 4.96 10*6/uL (ref 3.80–5.20)
RDW: 13.1 % (ref 11.5–14.5)
WBC: 4.4 x10 3/mm (ref 3.6–11.0)

## 2014-09-04 LAB — HEPATIC FUNCTION PANEL A (ARMC)
AST: 21 U/L (ref 15–37)
Albumin: 3.7 g/dL (ref 3.4–5.0)
Alkaline Phosphatase: 85 U/L (ref 46–116)
Bilirubin, Direct: 0.1 mg/dL (ref 0.0–0.2)
Bilirubin,Total: 0.4 mg/dL (ref 0.2–1.0)
SGPT (ALT): 34 U/L (ref 14–63)
Total Protein: 7.4 g/dL (ref 6.4–8.2)

## 2014-09-04 LAB — CREATININE, SERUM
Creatinine: 0.92 mg/dL (ref 0.60–1.30)
EGFR (African American): >60

## 2014-09-05 LAB — CANCER ANTIGEN 27.29: CA 27.29: 15.3 U/mL (ref 0.0–38.6)

## 2014-09-07 ENCOUNTER — Ambulatory Visit: Payer: Self-pay | Admitting: Internal Medicine

## 2014-09-14 ENCOUNTER — Encounter: Payer: Self-pay | Admitting: General Surgery

## 2014-09-14 ENCOUNTER — Ambulatory Visit: Payer: Self-pay | Admitting: General Surgery

## 2014-09-14 DIAGNOSIS — R922 Inconclusive mammogram: Secondary | ICD-10-CM | POA: Diagnosis not present

## 2014-09-14 DIAGNOSIS — Z853 Personal history of malignant neoplasm of breast: Secondary | ICD-10-CM | POA: Diagnosis not present

## 2014-09-23 ENCOUNTER — Encounter: Payer: Self-pay | Admitting: General Surgery

## 2014-09-23 ENCOUNTER — Ambulatory Visit (INDEPENDENT_AMBULATORY_CARE_PROVIDER_SITE_OTHER): Payer: Medicare HMO | Admitting: General Surgery

## 2014-09-23 VITALS — BP 116/68 | HR 74 | Resp 16 | Ht 67.0 in | Wt 243.0 lb

## 2014-09-23 DIAGNOSIS — C50412 Malignant neoplasm of upper-outer quadrant of left female breast: Secondary | ICD-10-CM

## 2014-09-23 NOTE — Progress Notes (Signed)
Patient ID: Leah Schaefer, female   DOB: 12-28-1940, 74 y.o.   MRN: [id number]  Chief Complaint  Patient presents with  . Follow-up    mammogram    HPI Leah Schaefer is a 74 y.o. female who presents for a breast evaluation. The most recent mammogram was done on 09/14/14 Patient does perform regular self breast checks and gets regular mammograms done.    HPI  Past Medical History  Diagnosis Date  . Hernia 2011  . Cystitis   . 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 Personal history of malignant neoplasm of breast   . 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 Colonoscopy  2011    Dr Candace Cruise  . Hernia repair Right 2009    inguinal, and umbilical  . Cholecystectomy  2007  . Dilation and curettage of uterus  2004  . Portacath placement  2013  . Upper gi endoscopy  2011  . Breast surgery Left 2012    wide excision  . Breast surgery Left November 2013    Core biopsy of the upper-outer quadrant showed fat necrosis.    Family History  Problem Relation Age of Onset  . Ovarian cancer Sister   . Lymphoma Father   . Colon cancer Brother   . Lymphoma Brother     Social History History  Substance Use Topics  . Smoking status: Former Smoker    Types: Cigarettes  . Smokeless tobacco: Never Used  . Alcohol Use: No    No Known Allergies  Current Outpatient Prescriptions  Medication Sig Dispense Refill  . acetaminophen (TYLENOL) 500 MG tablet Take 500 mg by mouth every 6 (six) hours as needed for pain.    . Ascorbic Acid (VITAMIN C) 100 MG tablet Take 100 mg by mouth daily.    Marland Kitchen aspirin 81 MG tablet Take 81 mg by mouth daily.    Marland Kitchen atorvastatin (LIPITOR) 10 MG tablet Take 10 mg by mouth daily.    . bimatoprost (LUMIGAN) 0.01 % SOLN 1 drop at bedtime.    . Calcium Carbonate-Vitamin D (CALCIUM 600 + D PO) Take 1 capsule by mouth daily.    Marland Kitchen escitalopram  (LEXAPRO) 20 MG tablet Take 20 mg by mouth daily.    . fish oil-omega-3 fatty acids 1000 MG capsule Take 2 g by mouth daily.    Marland Kitchen gabapentin (NEURONTIN) 300 MG capsule Take 300 mg by mouth 2 (two) times daily.    . meloxicam (MOBIC) 15 MG tablet Take 15 mg by mouth daily.    . metoprolol tartrate (LOPRESSOR) 25 MG tablet Take 25 mg by mouth 2 (two) times daily.    . Multiple Vitamins-Minerals (CENTRUM SILVER ULTRA WOMENS PO) Take 1 tablet by mouth daily.    . Niacin (VITAMIN B-3 PO) Take 1,000 mg by mouth.    Marland Kitchen omeprazole (PRILOSEC) 20 MG capsule Take 20 mg by mouth 2 (two) times daily.    Marland Kitchen pyridoxine (B-6) 100 MG tablet Take 100 mg by mouth daily.    Bethann Humble Sulfate (EYE DROPS A/C OP) Apply to eye.    . vitamin B-12 (CYANOCOBALAMIN) 1000 MCG tablet Take 1,000 mcg by mouth daily.    . vitamin E 400 UNIT capsule Take 400 Units by mouth daily.     No current facility-administered medications for this visit.    Review of  Systems Review of Systems  Constitutional: Negative.   Respiratory: Negative.   Cardiovascular: Negative.     Blood pressure 116/68, pulse 74, resp. rate 16, height 5\' 7"  (1.702 m), weight 243 lb (110.224 kg).  Physical Exam Physical Exam  Constitutional: She is oriented to person, place, and time. She appears well-developed and well-nourished.  Eyes: Conjunctivae are normal. No scleral icterus.  Neck: Neck supple.  Cardiovascular: Normal rate, regular rhythm and normal heart sounds.   Pulmonary/Chest: Effort normal and breath sounds normal. Right breast exhibits no inverted nipple, no mass, no nipple discharge, no skin change and no tenderness. Left breast exhibits no inverted nipple, no mass, no nipple discharge, no skin change and no tenderness.    Thickening in the lower left axilla from right breast . Thickening in the midpoint of the incision from 10 to 2  in the left breast .  Abdominal: Soft. Bowel sounds are normal. There is no tenderness.   Lymphadenopathy:    She has no cervical adenopathy.    She has no axillary adenopathy.  Neurological: She is alert and oriented to person, place, and time.  Skin: Skin is warm and dry.    Data Reviewed Bilateral diagnostic mammograms dated 09/14/2014 were reviewed and compared to previous studies. No interval change. Calcifications consistent with previous partial breast radiation noted. BI-RADS-2.  Assessment    Doing well now 2 years out status post surgical management of her breast cancer followed by adjuvant chemotherapy.    Plan    The patient has been asked to return to the office in one year with a bilateral diagnostic mammogram.     PCP:  Etheleen Mayhew 09/23/2014, 9:16 PM

## 2014-09-23 NOTE — Patient Instructions (Signed)
The patient has been asked to return to the office in one year with a bilateral diagnostic mammogram. 

## 2014-11-24 ENCOUNTER — Encounter: Payer: Self-pay | Admitting: General Surgery

## 2014-11-24 ENCOUNTER — Ambulatory Visit (INDEPENDENT_AMBULATORY_CARE_PROVIDER_SITE_OTHER): Payer: Commercial Managed Care - HMO | Admitting: General Surgery

## 2014-11-24 VITALS — BP 122/70 | HR 76 | Resp 13 | Ht 67.0 in | Wt 228.0 lb

## 2014-11-24 DIAGNOSIS — L729 Follicular cyst of the skin and subcutaneous tissue, unspecified: Secondary | ICD-10-CM | POA: Diagnosis not present

## 2014-11-24 DIAGNOSIS — C50412 Malignant neoplasm of upper-outer quadrant of left female breast: Secondary | ICD-10-CM | POA: Diagnosis not present

## 2014-11-24 NOTE — Progress Notes (Signed)
Patient ID: Leah Schaefer, female   DOB: [birth year], 74 y.o.   MRN: [id number]  Chief Complaint  Patient presents with  . Other    left breast lump    HPI Leah Schaefer is a 74 y.o. female here today for a left breast lump. She noticed this about three weeks ago. She states no pain or change in size, It is located left size of nipple.  HPI  Past Medical History  Diagnosis Date  . Hernia 2011  . Cystitis   . 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 Personal history of malignant neoplasm of breast   . 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 Colonoscopy  2011    Dr Candace Cruise  . Hernia repair Right 2009    inguinal, and umbilical  . Cholecystectomy  2007  . Dilation and curettage of uterus  2004  . Portacath placement  2013  . Upper gi endoscopy  2011  . Breast surgery Left 2012    wide excision  . Breast surgery Left November 2013    Core biopsy of the upper-outer quadrant showed fat necrosis.    Family History  Problem Relation Age of Onset  . Ovarian cancer Sister   . Lymphoma Father   . Colon cancer Brother   . Lymphoma Brother     Social History History  Substance Use Topics  . Smoking status: Former Smoker    Types: Cigarettes  . Smokeless tobacco: Never Used  . Alcohol Use: No    No Known Allergies  Current Outpatient Prescriptions  Medication Sig Dispense Refill  . acetaminophen (TYLENOL) 500 MG tablet Take 500 mg by mouth every 6 (six) hours as needed for pain.    Marland Kitchen acyclovir (ZOVIRAX) 200 MG capsule     . Ascorbic Acid (VITAMIN C) 100 MG tablet Take 100 mg by mouth daily.    Marland Kitchen aspirin 81 MG tablet Take 81 mg by mouth daily.    Marland Kitchen atorvastatin (LIPITOR) 10 MG tablet Take 10 mg by mouth daily.    . bimatoprost (LUMIGAN) 0.01 % SOLN 1 drop at bedtime.    . Calcium Carbonate-Vitamin D (CALCIUM 600 + D PO) Take 1 capsule by mouth daily.     Marland Kitchen escitalopram (LEXAPRO) 20 MG tablet Take 20 mg by mouth daily.    . fish oil-omega-3 fatty acids 1000 MG capsule Take 2 g by mouth daily.    Marland Kitchen gabapentin (NEURONTIN) 300 MG capsule Take 300 mg by mouth 2 (two) times daily.    . meloxicam (MOBIC) 15 MG tablet Take 15 mg by mouth daily.    . metoprolol tartrate (LOPRESSOR) 25 MG tablet Take 25 mg by mouth 2 (two) times daily.    . Multiple Vitamins-Minerals (CENTRUM SILVER ULTRA WOMENS PO) Take 1 tablet by mouth daily.    . Niacin (VITAMIN B-3 PO) Take 1,000 mg by mouth.    Marland Kitchen omeprazole (PRILOSEC) 20 MG capsule Take 20 mg by mouth 2 (two) times daily.    Marland Kitchen pyridoxine (B-6) 100 MG tablet Take 100 mg by mouth daily.    Bethann Humble Sulfate (EYE DROPS A/C OP) Apply to eye.    . vitamin B-12 (CYANOCOBALAMIN) 1000 MCG tablet Take 1,000 mcg by mouth daily.    . vitamin E 400 UNIT capsule Take 400 Units by mouth daily.  No current facility-administered medications for this visit.    Review of Systems Review of Systems  Constitutional: Negative.   Respiratory: Negative.   Cardiovascular: Negative.     Blood pressure 122/70, pulse 76, resp. rate 13, height 5\' 7"  (1.702 m), weight 228 lb (103.42 kg).  Physical Exam Physical Exam  Constitutional: She is oriented to person, place, and time. She appears well-developed and well-nourished.  Eyes: Conjunctivae are normal. No scleral icterus.  Neck: Neck supple.  Cardiovascular: Normal rate, regular rhythm and normal heart sounds.   Pulmonary/Chest: Effort normal and breath sounds normal. Right breast exhibits no inverted nipple, no mass, no nipple discharge, no skin change and no tenderness. Left breast exhibits no inverted nipple, no mass, no nipple discharge, no skin change and no tenderness.    Left breast well healed incision from 10 to 2 . 1 cm thickening area at areolar.  Lymphadenopathy:    She has no cervical adenopathy.  Neurological: She is alert and oriented to person,  place, and time.  Skin: Skin is warm and dry.    Data Reviewed Previous path/mammograms.  Assessment    Dermal cyst, left breast.    Plan    Options for management were reviewed: Observation, with a prohibition of manipulation of the area versus excision. The patient has chosen the latter. This will be scheduled convenient date as an office procedure..   Patient to be scheduled for  left breast skin cyst   PCP:  Etheleen Mayhew 11/24/2014, 7:50 PM

## 2014-11-24 NOTE — Consult Note (Signed)
Reason for Visit: This 74 year old Female patient presents to the clinic for initial evaluation of  Breast cancer .   Referred by Dr. Ma Hillock.  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 Enrolled on NSABP protocol B. 54 with adjuvant Herceptin   Pathology Report Pathology report reviewed    Imaging Report Mammogram and ultrasound reviewed    Referral Report Clinical notes reviewed    Planned Treatment Regimen Adjuvant radiation therapy to left breast    HPI   patient is a 74 year old female who presented with an abnormal mammogram of the left breast. Initial biopsy was positive for invasive mammary carcinoma.tumor was 2.4 cm overall grade 3 with margins negative. One sentinel lymph node was negative for metastatic disease. Tumor was ER/PR negative HER-2/neu not overexpressed. She was treated on NSABP protocol B. 47 receiving Cytoxan Adriamycin weekly Taxol plus Herceptin. She has developed some peripheral neuropathy although not severe. She has completed chemotherapy and has now referred to radiation oncology for consideration of treatment. She is doing well. She is having no breast tenderness cough or bone pain at this time.  Past Hx:    Breast Cancer:    Oncology Protocol: Pt is participating in a research study: NSABP B-47 and is receiving Adriamycin + Cytoxan followed by Taxol + Trastuzumab followed by Trastuzumab alone. Yolande Jolly is primary Nutritional therapist. Call [phone] for any problems or questions.   Obesity:    GERD - Esophageal Reflux:    Arthritis:    HTN:    Herpes - genital:    Wide excision left breast, SN biopsy: Jan 2013   Cholecystectomy:    Hernia Repair x 2:    D&C - Dilation and Curretage:   Past, Family and Social History:   Past Medical History positive    Cardiovascular hypertension     Gastrointestinal GERD    Genitourinary Genital herpes    Past Surgical History Herniorrhaphy repair, D&C    Past Medical History Comments Chronic obesity, glaucoma, cataracts    Family History positive    Family History Comments Family history positive for cervical, colon, lung CA. Also history of thrombophlebitis    Social History noncontributory    Additional Past Medical and Surgical History Accompanied by family member today   Allergies:   No Known Allergies:   Home Meds:  Home Medications: Medication Instructions Status  alprazolam 0.25 mg oral tablet 1 tab(s) orally 2-3 times a day- for Anxiety, Nervousness  Active  omeprazole 20 mg oral delayed release tablet 1 tab(s) orally 2 times a day Active  Carafate 1 g oral tablet 1 tab(s) orally 4 times a day on an empty stomach Active  venlafaxine 37.5 mg capsule, extended release 1 cap(s) orally once a day (at bedtime) Active  promethazine 25 mg oral tablet 1 tab(s) orally every 6 hours, As Needed- for Nausea, Vomiting  Active  Imodium A-D 2 mg tablet 1 tab(s) orally every 4 hours x 3 days Active  Bentyl 20 mg tablet 1 tab(s) orally 3 times a day x 10 days prn spasm Active  omega-3 polyunsaturated fatty acids oral capsule 3 tabs orally in am Active  centrum silver 1 tab orally in am Active  vit B 6 100 mg 1 tab orally in am Active  Tylenol 325 mg oral tablet 2 tab(s) orally every 4 hours, As Needed Active  ascorbic acid  500 mg oral capsule 1  orally once a day (in the morning) Active  cholecalciferol 400 intl units oral capsule 1  orally once a day (in the morning) Active  Lumigan 0.03% ophthalmic solution 1 drop(s) to each affected eye once a day (at bedtime) Active  Ascriptin Enteric 81 mg tablet 1  orally once a day (in the morning) Active  E Pherol 400 intl units oral capsule 1  orally once a day (in the morning) Active  calcium 600 plus D 1 tab orally in am Active  Refresh Optive Advanced ophthalmic solution 1 drop(s) to  each affected eye 2 times a day, As Needed dry eyes Active  gabapentin 300 mg oral capsule 1  orally in the morning and 1 tab in the afternoon Active  cyanocobalamin 500 mcg oral tablet 2  orally once a day (in the morning) Active  PriLOSEC OTC 20 mg oral delayed release tablet 1 tab(s) orally once a day  Active   Review of Systems:   General negative    Performance Status (ECOG) 0    Skin negative    Breast see HPI    Ophthalmologic see HPI    ENMT negative    Respiratory and Thorax negative    Gastrointestinal negative    Genitourinary negative    Musculoskeletal negative    Neurological see HPI    Psychiatric negative    Hematology/Lymphatics negative    Endocrine negative    Allergic/Immunologic negative   Physical Exam:  General/Skin/HEENT:   General normal    Skin normal    Eyes normal    ENMT normal    Head and Neck normal    Additional PE Well-developed obese female in NAD. She has a Port-A-Cath placed in her right anterior chest. She status post wide local excision of the left breast. No dominant mass or nodularity is noted in either breast into position examined. No axillary or supraclavicular adenopathy is appreciated. Lungs are clear to A&P.   Breasts/Resp/CV/GI/GU:   Respiratory and Thorax normal    Cardiovascular normal    Gastrointestinal normal    Genitourinary normal   MS/Neuro/Psych/Lymph:   Musculoskeletal normal    Neurological normal    Lymphatics normal   Assessment and Plan:  Impression:   74 year old female with pathologic stage IIa invasive mammary carcinoma status post wide local excision and adjuvant chemotherapy including Cytoxan Adriamycin Taxol and Herceptin under NSABP protocol B. 47  Plan:   the stomach to go ahead with whole breast radiation. According to her protocol we will treat up to 5000 cGy over 4 weeks. Margins were clear but somewhat close and I will treat with an additional 1600 cGy in 8 fractions  using electron beam for scar boost. Risks and benefits of treatment including skin reaction, fatigue, slight occlusion of superficial lung were all discussed in detail with the patient. I have set her up for CT simulation early next week. I have chosen not to treat her peripheral lymphatics since her sentinel node was negative.  I would like to take this opportunity to thank you for allowing me to continue to participate in this patient's care.  CC Referral:   cc: Dr. Margarita Rana   Electronic Signatures: Baruch Gouty Roda Shutters (MD)  (Signed 22-Jul-13 11:07)  Authored: HPI, Diagnosis, Past Hx, PFSH, Allergies, Home Meds, ROS, Physical Exam, Encounter Assessment and Plan, CC Referring Physician   Last Updated: 22-Jul-13 11:07 by Armstead Peaks (MD)

## 2014-11-24 NOTE — Patient Instructions (Signed)
Scheduled left breast skin cyst

## 2014-11-29 NOTE — Op Note (Signed)
PATIENT NAME:  Leah Schaefer, BAKA MR#:  [id number] DATE OF BIRTH:  02/05/1941  DATE OF PROCEDURE:  08/17/2011  PREOPERATIVE DIAGNOSIS: Left breast cancer.   POSTOPERATIVE DIAGNOSIS: Left breast cancer.   OPERATIVE PROCEDURES:  1. Wide excision with mastoplasty. 2. Sentinel node biopsy.   SURGEON: Robert Bellow, MD  ANESTHESIA: General by LMA, Marcaine 0.5% with 1:200,000 units of epinephrine 30 mL local infiltration.   ESTIMATED BLOOD LOSS: Less than 50 mL.   CLINICAL NOTE: This 74 year old woman has been identified with a left breast mass and core biopsy showed evidence of invasive mammary cancer. She desired breast conservation. She is brought to the Operating Room for planned wide excision and sentinel node biopsy. She had been injected with technetium sulfur colloid prior to the procedure.   OPERATIVE NOTE: With the patient under adequate general anesthesia, the periareolar skin was prepped with alcohol and a total of 4 mL of methylene blue diluted 1:2 with normal saline was injected in the subareolar plexus. The chest, breast, axilla and lower neck was then prepped with ChloraPrep and draped. The axilla was scanned with the gamma finder. An area of increased uptake identified. Skin incision was infiltrated with local anesthetic and a transverse incision in the lower aspect of the axilla completed. Through a generous layer of adipose tissue a blue lymphatic was tacked down to a single blue hot node. This was sent for frozen section showing no evidence of macrometastases. The wound was closed in layers with 2-0 Vicryl deep in a running fashion and the skin closed with a running 4-0 Vicryl subcuticular suture.   Attention was then turned towards the breast. Ultrasound was used to identify the boundaries of the lesion. It came to just within a centimeter of the skin and as the patient may well be a candidate for MammoSite it was elected to excise a small ellipse of skin measuring about 1.5  cm in diameter and perhaps 7 cm in length. The area was infiltrated with Marcaine for postoperative analgesia. The excision was then carried down to and including the fascia of the underlying muscle. Assessment radiograph confirmed the previously placed clip to be present. It was closest to the superior border. Frozen section was requested and gross examination completed by Inda Castle, M.D. showed a 3 mm margin to the closest border. The breast was elevated off the underlying pectoralis fascia muscle and approximated at the base of the wound with interrupted 2-0 Vicryl figure-of-eight sutures. The more superficial layer of adipose tissue was then approximated in a similar fashion. The skin was mobilized for approximately 2 cm distance circumferentially and then approximated initially with 2-0 Vicryl in the deep dermal layer and a running 4-0 Vicryl subcuticular suture for the skin. Benzoin and Steri-Strips were applied. Telfa and Tegaderm was placed on the axillary wound, Tegaderm alone for the breast. Fluff gauze, Kerlix and an Ace wrap was then applied.   Patient tolerated the procedure well and was taken to recovery room in stable condition.  ____________________________ Robert Bellow, MD jwb:cms D: 08/17/2011 20:44:04 ET T: 08/18/2011 09:44:10 ET JOB#: [id number]  cc: Robert Bellow, MD, <Dictator> JEFFREY Amedeo Kinsman MD ELECTRONICALLY SIGNED 08/21/2011 22:06

## 2014-11-29 NOTE — Op Note (Signed)
PATIENT NAME:  Leah Schaefer, Leah Schaefer MR#:  [id number] DATE OF BIRTH:  October 01, 1940  DATE OF PROCEDURE:  09/04/2011  PREOPERATIVE DIAGNOSIS: Left breast cancer.   POSTOPERATIVE DIAGNOSIS:  Left breast cancer.   OPERATIVE PROCEDURE: Right subclavian power port placement.   OPERATING SURGEON: Hervey Ard, MD.  ANESTHESIA: Attended local, 10 mL 1% plain Xylocaine.   ESTIMATED BLOOD LOSS: Minimal.   CLINICAL NOTE: This 74 year old woman was recently diagnosed with a triple negative left breast cancer and is considered a candidate for adjuvant chemotherapy. Central venous access was requested by her treating oncologist.   OPERATIVE NOTE: With the patient under mild sedation the right chest was prepped with ChloraPrep and draped. In Trendelenburg position, the right subclavian vein was visualized and under ultrasound guidance this was cannulated. The guidewire was advanced and followed by the dilator. The catheter was positioned in the superior vena cava and because it appeared slightly more medial than expected this catheter position was confirmed with injection of a total of 10 mL of Conray 60. The catheter was tunneled to a pocket on the right anterior medial chest. It was anchored to the tissue with 3-0 Prolene sutures. The catheter easily irrigated and aspirated with the patient in the supine position. The port site was closed with a running 3-0 Vicryl in the deep layer and a running 4-0 Vicryl subcuticular suture for the skin. Benzoin, Steri-Strips, Telfa, and Tegaderm dressings were applied.          Erect portable chest x-ray in the recovery room showed the catheter tip in good position in the distal SVC/right atrial area and no evidence of pneumothorax.   ____________________________ Robert Bellow, MD jwb:ap D: 09/04/2011 10:34:18 ET T: 09/04/2011 10:48:01 ET JOB#: [id number]  cc: Robert Bellow, MD, <Dictator> Sandeep R. Ma Hillock, MD Korra Christine Amedeo Kinsman MD ELECTRONICALLY SIGNED  09/04/2011 14:40

## 2014-12-08 ENCOUNTER — Encounter: Payer: Self-pay | Admitting: General Surgery

## 2014-12-08 ENCOUNTER — Ambulatory Visit (INDEPENDENT_AMBULATORY_CARE_PROVIDER_SITE_OTHER): Payer: Commercial Managed Care - HMO | Admitting: General Surgery

## 2014-12-08 VITALS — BP 136/76 | HR 76 | Resp 12 | Ht 67.0 in | Wt 228.0 lb

## 2014-12-08 DIAGNOSIS — N641 Fat necrosis of breast: Secondary | ICD-10-CM | POA: Diagnosis not present

## 2014-12-08 DIAGNOSIS — N6082 Other benign mammary dysplasias of left breast: Secondary | ICD-10-CM | POA: Diagnosis not present

## 2014-12-08 DIAGNOSIS — N6012 Diffuse cystic mastopathy of left breast: Secondary | ICD-10-CM | POA: Diagnosis not present

## 2014-12-08 NOTE — Patient Instructions (Signed)
One week nurse.  

## 2014-12-08 NOTE — Progress Notes (Signed)
Patient ID: Leah Schaefer, female   DOB: May 22, 1941, 74 y.o.   MRN: [id number]  Chief Complaint  Patient presents with  . Procedure    skin cyst excision    HPI Leah Schaefer is a 74 y.o. female here today for a excision left breast skin cyst. HPI  Past Medical History  Diagnosis Date  . Hernia 2011  . Cystitis   . 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 Personal history of malignant neoplasm of breast   . 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 Colonoscopy  2011    Dr Candace Cruise  . Hernia repair Right 2009    inguinal, and umbilical  . Cholecystectomy  2007  . Dilation and curettage of uterus  2004  . Portacath placement  2013  . Upper gi endoscopy  2011  . Breast surgery Left 2012    wide excision  . Breast surgery Left November 2013    Core biopsy of the upper-outer quadrant showed fat necrosis.    Family History  Problem Relation Age of Onset  . Ovarian cancer Sister   . Lymphoma Father   . Colon cancer Brother   . Lymphoma Brother     Social History History  Substance Use Topics  . Smoking status: Former Smoker    Types: Cigarettes  . Smokeless tobacco: Never Used  . Alcohol Use: No    No Known Allergies  Current Outpatient Prescriptions  Medication Sig Dispense Refill  . acetaminophen (TYLENOL) 500 MG tablet Take 500 mg by mouth every 6 (six) hours as needed for pain.    Marland Kitchen acyclovir (ZOVIRAX) 200 MG capsule     . Ascorbic Acid (VITAMIN C) 100 MG tablet Take 100 mg by mouth daily.    Marland Kitchen aspirin 81 MG tablet Take 81 mg by mouth daily.    Marland Kitchen atorvastatin (LIPITOR) 10 MG tablet Take 10 mg by mouth daily.    . bimatoprost (LUMIGAN) 0.01 % SOLN 1 drop at bedtime.    . Calcium Carbonate-Vitamin D (CALCIUM 600 + D PO) Take 1 capsule by mouth daily.    Marland Kitchen escitalopram (LEXAPRO) 20 MG tablet Take 20 mg by mouth daily.    . fish oil-omega-3  fatty acids 1000 MG capsule Take 2 g by mouth daily.    Marland Kitchen gabapentin (NEURONTIN) 300 MG capsule Take 300 mg by mouth 2 (two) times daily.    . meloxicam (MOBIC) 15 MG tablet Take 15 mg by mouth daily.    . metoprolol tartrate (LOPRESSOR) 25 MG tablet Take 25 mg by mouth 2 (two) times daily.    . Multiple Vitamins-Minerals (CENTRUM SILVER ULTRA WOMENS PO) Take 1 tablet by mouth daily.    . Niacin (VITAMIN B-3 PO) Take 1,000 mg by mouth.    Marland Kitchen omeprazole (PRILOSEC) 20 MG capsule Take 20 mg by mouth 2 (two) times daily.    Marland Kitchen pyridoxine (B-6) 100 MG tablet Take 100 mg by mouth daily.    Bethann Humble Sulfate (EYE DROPS A/C OP) Apply to eye.    . vitamin B-12 (CYANOCOBALAMIN) 1000 MCG tablet Take 1,000 mcg by mouth daily.    . vitamin E 400 UNIT capsule Take 400 Units by mouth daily.     No current facility-administered medications for this visit.    Review of Systems Review of Systems  Constitutional: Negative.  Respiratory: Negative.   Cardiovascular: Negative.     Blood pressure 136/76, pulse 76, resp. rate 12, height 5\' 7"  (1.702 m), weight 103.42 kg (228 lb).  Physical Exam Physical Exam  Pulmonary/Chest:         Assessment    Dermal cyst    Plan    The area was cleansed with alcohol and 10 mL of 0.5% Xylocaine with 0.25% Marcaine with 1-200,000 of epinephrine was utilized well tolerated. Through a circumareolar incision from the 2 to 4:00 position the skin was incised and the dermal thickening sharply debrided. This had the consistency of fat necrosis. The wound was closed with interrupted 4-0 nylon horizontal mattress sutures. A dry dressing with Telfa and Tegaderm was applied.  The patient may remove the bandage in 3 days. May shower/swim without restriction.  She'll return in one week for suture removal. She'll be contacted by phone when pathology results are available.       Robert Bellow 12/09/2014, 6:52 AM

## 2014-12-15 ENCOUNTER — Ambulatory Visit (INDEPENDENT_AMBULATORY_CARE_PROVIDER_SITE_OTHER): Payer: Commercial Managed Care - HMO | Admitting: *Deleted

## 2014-12-15 DIAGNOSIS — N6082 Other benign mammary dysplasias of left breast: Secondary | ICD-10-CM

## 2014-12-15 NOTE — Progress Notes (Signed)
Patient came in today for a wound check//suture removal.  The wound is clean, with no signs of infection noted. Aware of pathology.Follow up as scheduled.

## 2014-12-15 NOTE — Patient Instructions (Signed)
The patient is aware to call back for any questions or concerns.  

## 2014-12-28 DIAGNOSIS — F419 Anxiety disorder, unspecified: Secondary | ICD-10-CM | POA: Diagnosis not present

## 2014-12-28 DIAGNOSIS — Z1389 Encounter for screening for other disorder: Secondary | ICD-10-CM | POA: Diagnosis not present

## 2014-12-28 DIAGNOSIS — E1142 Type 2 diabetes mellitus with diabetic polyneuropathy: Secondary | ICD-10-CM | POA: Diagnosis not present

## 2014-12-28 DIAGNOSIS — M79605 Pain in left leg: Secondary | ICD-10-CM | POA: Diagnosis not present

## 2014-12-29 DIAGNOSIS — H4011X2 Primary open-angle glaucoma, moderate stage: Secondary | ICD-10-CM | POA: Diagnosis not present

## 2015-01-08 ENCOUNTER — Other Ambulatory Visit: Payer: Self-pay | Admitting: Family Medicine

## 2015-01-08 DIAGNOSIS — E1140 Type 2 diabetes mellitus with diabetic neuropathy, unspecified: Secondary | ICD-10-CM

## 2015-01-08 DIAGNOSIS — E119 Type 2 diabetes mellitus without complications: Secondary | ICD-10-CM | POA: Insufficient documentation

## 2015-01-08 MED ORDER — ACCU-CHEK NANO SMARTVIEW W/DEVICE KIT: PACK | Status: DC

## 2015-01-08 MED ORDER — ACCU-CHEK SOFT TOUCH LANCETS MISC: Status: DC

## 2015-01-08 MED ORDER — GLUCOSE BLOOD VI STRP: ORAL_STRIP | Status: DC

## 2015-01-08 NOTE — Telephone Encounter (Signed)
PT would like a RX for Accu Check Nano meter and testing supplies sent to [place]. Thanks TNP

## 2015-01-11 ENCOUNTER — Telehealth: Payer: Self-pay | Admitting: Family Medicine

## 2015-01-12 ENCOUNTER — Other Ambulatory Visit: Payer: Self-pay

## 2015-01-12 DIAGNOSIS — E1140 Type 2 diabetes mellitus with diabetic neuropathy, unspecified: Secondary | ICD-10-CM

## 2015-01-12 MED ORDER — GLUCOSE BLOOD VI STRP: ORAL_STRIP | Status: DC

## 2015-01-14 ENCOUNTER — Other Ambulatory Visit: Payer: Self-pay | Admitting: Family Medicine

## 2015-01-14 ENCOUNTER — Other Ambulatory Visit: Payer: Self-pay

## 2015-01-14 DIAGNOSIS — E1140 Type 2 diabetes mellitus with diabetic neuropathy, unspecified: Secondary | ICD-10-CM

## 2015-01-21 ENCOUNTER — Other Ambulatory Visit: Payer: Self-pay

## 2015-01-21 DIAGNOSIS — E1140 Type 2 diabetes mellitus with diabetic neuropathy, unspecified: Secondary | ICD-10-CM

## 2015-01-21 MED ORDER — ACCU-CHEK NANO SMARTVIEW W/DEVICE KIT: PACK | Status: DC

## 2015-01-21 MED ORDER — ACCU-CHEK SMARTVIEW CONTROL VI LIQD: Status: DC

## 2015-01-21 MED ORDER — ACCU-CHEK FASTCLIX LANCETS MISC: Status: DC

## 2015-01-21 MED ORDER — BD SWAB SINGLE USE REGULAR PADS: MEDICATED_PAD | Status: DC

## 2015-01-21 MED ORDER — GLUCOSE BLOOD VI STRP: ORAL_STRIP | Status: DC

## 2015-02-02 ENCOUNTER — Ambulatory Visit (INDEPENDENT_AMBULATORY_CARE_PROVIDER_SITE_OTHER): Payer: Commercial Managed Care - HMO | Admitting: Family Medicine

## 2015-02-02 ENCOUNTER — Encounter: Payer: Self-pay | Admitting: Family Medicine

## 2015-02-02 VITALS — BP 124/84 | HR 68 | Temp 98.5°F | Resp 16 | Ht 66.0 in | Wt 226.4 lb

## 2015-02-02 DIAGNOSIS — H01003 Unspecified blepharitis right eye, unspecified eyelid: Secondary | ICD-10-CM

## 2015-02-02 NOTE — Progress Notes (Signed)
Subjective:     Patient ID: Leah Schaefer, female   DOB: 12-28-1940, 74 y.o.   MRN: [id number]  HPI  Chief Complaint  Patient presents with  . Rash    patient comes in office today with concerns of possible rash or dry skin around right eye and under bottom lid. 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 Uses Noxzema as a skin moisturizer but has tried hydrocortisone, abx ointment, and Benadryl cream with little relief.   Review of Systems  Skin:       No hx of eczema       Objective:   Physical Exam  Constitutional: She appears well-developed and well-nourished. No distress.  Skin:  Mild scaling in her right eyebrow and upper eyelid. No rash noted.       Assessment:    1. Blepharitis, right     Plan:   Discussed use of baby shampoo scrubs.

## 2015-02-02 NOTE — Patient Instructions (Signed)
Discussed use of baby shampoo to her eyebrows up to twice daily.

## 2015-02-15 ENCOUNTER — Other Ambulatory Visit: Payer: Self-pay | Admitting: Family Medicine

## 2015-02-15 DIAGNOSIS — E1140 Type 2 diabetes mellitus with diabetic neuropathy, unspecified: Secondary | ICD-10-CM

## 2015-02-24 ENCOUNTER — Encounter: Payer: Self-pay | Admitting: *Deleted

## 2015-02-25 ENCOUNTER — Ambulatory Visit: Payer: Commercial Managed Care - HMO | Admitting: Anesthesiology

## 2015-02-25 ENCOUNTER — Encounter
Admission: RE | Disposition: A | Discharge status: Home / Self Care | Payer: Self-pay | Source: Ambulatory Visit | Attending: Gastroenterology

## 2015-02-25 ENCOUNTER — Ambulatory Visit
Admission: RE | Admit: 2015-02-25 | Discharge: 2015-02-25 | Disposition: A | Discharge status: Home / Self Care | Payer: Commercial Managed Care - HMO | Source: Ambulatory Visit | Attending: Gastroenterology | Admitting: Gastroenterology

## 2015-02-25 DIAGNOSIS — Z853 Personal history of malignant neoplasm of breast: Secondary | ICD-10-CM | POA: Insufficient documentation

## 2015-02-25 DIAGNOSIS — Z7982 Long term (current) use of aspirin: Secondary | ICD-10-CM | POA: Diagnosis not present

## 2015-02-25 DIAGNOSIS — E669 Obesity, unspecified: Secondary | ICD-10-CM | POA: Insufficient documentation

## 2015-02-25 DIAGNOSIS — Z86010 Personal history of colonic polyps: Secondary | ICD-10-CM | POA: Insufficient documentation

## 2015-02-25 DIAGNOSIS — I10 Essential (primary) hypertension: Secondary | ICD-10-CM | POA: Insufficient documentation

## 2015-02-25 DIAGNOSIS — Z87891 Personal history of nicotine dependence: Secondary | ICD-10-CM | POA: Insufficient documentation

## 2015-02-25 DIAGNOSIS — Z79899 Other long term (current) drug therapy: Secondary | ICD-10-CM | POA: Insufficient documentation

## 2015-02-25 DIAGNOSIS — E119 Type 2 diabetes mellitus without complications: Secondary | ICD-10-CM | POA: Diagnosis not present

## 2015-02-25 DIAGNOSIS — Z6835 Body mass index (BMI) 35.0-35.9, adult: Secondary | ICD-10-CM | POA: Insufficient documentation

## 2015-02-25 DIAGNOSIS — Z09 Encounter for follow-up examination after completed treatment for conditions other than malignant neoplasm: Secondary | ICD-10-CM | POA: Insufficient documentation

## 2015-02-25 HISTORY — DX: Essential (primary) hypertension: I10

## 2015-02-25 HISTORY — DX: Type 2 diabetes mellitus without complications: E11.9

## 2015-02-25 HISTORY — PX: COLONOSCOPY WITH PROPOFOL: SHX5780

## 2015-02-25 LAB — GLUCOSE, CAPILLARY: Glucose-Capillary: 168 mg/dL — ABNORMAL HIGH (ref 65–99)

## 2015-02-25 SURGERY — COLONOSCOPY WITH PROPOFOL
Anesthesia: General

## 2015-02-25 MED ORDER — PROPOFOL INFUSION 10 MG/ML OPTIME
INTRAVENOUS | Status: DC | PRN
  Administered 2015-02-25: 140 ug/kg/min via INTRAVENOUS

## 2015-02-25 MED ORDER — LIDOCAINE HCL (CARDIAC) 20 MG/ML IV SOLN
INTRAVENOUS | Status: DC | PRN
  Administered 2015-02-25: 30 mg via INTRAVENOUS

## 2015-02-25 MED ORDER — SODIUM CHLORIDE 0.9 % IV SOLN
INTRAVENOUS | Status: DC
  Administered 2015-02-25: 10:00:00 via INTRAVENOUS

## 2015-02-25 MED ORDER — SODIUM CHLORIDE 0.9 % IV SOLN
INTRAVENOUS | Status: DC
  Administered 2015-02-25: 1000 mL via INTRAVENOUS

## 2015-02-25 MED ORDER — FENTANYL CITRATE (PF) 100 MCG/2ML IJ SOLN
INTRAMUSCULAR | Status: DC | PRN
  Administered 2015-02-25: 50 ug via INTRAVENOUS

## 2015-02-25 MED ORDER — PROPOFOL 10 MG/ML IV BOLUS
INTRAVENOUS | Status: DC | PRN
  Administered 2015-02-25: 50 mg via INTRAVENOUS

## 2015-02-25 MED ORDER — MIDAZOLAM HCL 5 MG/5ML IJ SOLN
INTRAMUSCULAR | Status: DC | PRN
  Administered 2015-02-25: 1 mg via INTRAVENOUS

## 2015-02-25 NOTE — Op Note (Signed)
[facility] [place] [facility] Patient Name: Leah Schaefer Procedure Date: 02/25/2015 10:21 AM MRN: [id number] Account #: [id number] Date of Birth: Dec 17, 1940 Admit Type: Outpatient Age: 74 Room: [facility] ENDO ROOM 4 Gender: Female Note Status: Finalized Procedure:         Colonoscopy Indications:       Personal history of colonic polyps Providers:         Lupita Dawn. Candace Cruise, MD Referring MD:      Jerrell Belfast, MD (Referring MD) Medicines:         Sedation Required Anesthesia Staff Assistance, Monitored                     Anesthesia Care Complications:     No immediate complications. Procedure:         Pre-Anesthesia Assessment:                    - Prior to the procedure, a History and Physical was                     performed, and patient medications, allergies and                     sensitivities were reviewed. The patient's tolerance of                     previous anesthesia was reviewed.                    - The risks and benefits of the procedure and the sedation                     options and risks were discussed with the patient. All                     questions were answered and informed consent was obtained.                    - After reviewing the risks and benefits, the patient was                     deemed in satisfactory condition to undergo the procedure.                    After obtaining informed consent, the colonoscope was                     passed under direct vision. Throughout the procedure, the                     patient's blood pressure, pulse, and oxygen saturations                     were monitored continuously. The Olympus CF-Q160AL                     colonoscope (S#. [phone]) was introduced through the anus                     and advanced to the the cecum, identified by appendiceal                     orifice and ileocecal valve. The colonoscopy was performed  without difficulty. The patient tolerated the procedure                      well. The quality of the bowel preparation was good. Findings:      The colon (entire examined portion) appeared normal. Impression:        - The entire examined colon is normal.                    - No specimens collected. Recommendation:    - Discharge patient to home.                    - Repeat colonoscopy in 5 years for surveillance.                    - The findings and recommendations were discussed with the                     patient. Procedure Code(s): --- Professional ---                    [phone], Colonoscopy, flexible; diagnostic, including                     collection of specimen(s) by brushing or washing, when                     performed (separate procedure) Diagnosis Code(s): --- Professional ---                    Z86.010, Personal history of colonic polyps CPT copyright 2014 American Medical Association. All rights reserved. The codes documented in this report are preliminary and upon coder review may  be revised to meet current compliance requirements. Hulen Luster, MD 02/25/2015 10:51:40 AM This report has been signed electronically. Number of Addenda: 0 Note Initiated On: 02/25/2015 10:21 AM Scope Withdrawal Time: 0 hours 6 minutes 1 second  Total Procedure Duration: 0 hours 10 minutes 25 seconds       [facility]

## 2015-02-25 NOTE — Transfer of Care (Signed)
Immediate Anesthesia Transfer of Care Note  Patient: Leah Schaefer  Procedure(s) Performed: Procedure(s): COLONOSCOPY WITH PROPOFOL (N/A)  Patient Location: PACU and Short Stay  Anesthesia Type:General  Level of Consciousness: awake  Airway & Oxygen Therapy: Patient Spontanous Breathing and Patient connected to nasal cannula oxygen  Post-op Assessment: Report given to RN and Post -op Vital signs reviewed and stable  Post vital signs: Reviewed and stable  Last Vitals:  Filed Vitals:   02/25/15 1056  BP: 96/44  Pulse: 70  Temp: 36.1 C  Resp:     Complications: No apparent anesthesia complications

## 2015-02-25 NOTE — H&P (Signed)
Primary Care Physician:  Margarita Rana, MD Primary Gastroenterologist:  Dr. Candace Cruise  Pre-Procedure History & Physical: HPI:  Leah Schaefer is a 74 y.o. female is here for an colonoscopy.  Past Medical History  Diagnosis Date  . Hernia 2011  . Cystitis   . 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 Personal history of malignant neoplasm of breast   . 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 Hypertension   . Diabetes mellitus without complication     Past Surgical History  Procedure Laterality Date  . Colonoscopy  2011    Dr Candace Cruise  . Hernia repair Right 2009    inguinal, and umbilical  . Cholecystectomy  2007  . Dilation and curettage of uterus  2004  . Portacath placement  2013  . Upper gi endoscopy  2011  . Breast surgery Left 2012    wide excision  . Breast surgery Left November 2013    Core biopsy of the upper-outer quadrant showed fat necrosis.    Prior to Admission medications   Medication Sig Start Date End Date Taking? Authorizing Provider  HYDROcodone-acetaminophen (NORCO/VICODIN) 5-325 MG per tablet Take 1 tablet by mouth every 6 (six) hours as needed for moderate pain.   Yes Historical Provider, MD  metoprolol tartrate (LOPRESSOR) 25 MG tablet Take 25 mg by mouth 2 (two) times daily.   Yes Historical Provider, MD  Multiple Vitamins-Minerals (CENTRUM SILVER ULTRA WOMENS PO) Take 1 tablet by mouth daily.   Yes Historical Provider, MD  ACCU-CHEK FASTCLIX LANCETS MISC To check blood sugar once a day. 01/21/15   Margarita Rana, MD  acetaminophen (TYLENOL) 500 MG tablet Take 500 mg by mouth every 6 (six) hours as needed for pain.    Historical Provider, MD  acyclovir (ZOVIRAX) 200 MG capsule  11/18/14   Historical Provider, MD  Alcohol Swabs (B-D SINGLE USE SWABS REGULAR) PADS To check blood sugar once a day. 01/21/15   Margarita Rana, MD  Ascorbic Acid (VITAMIN C) 100 MG tablet Take 100 mg by mouth daily.     Historical Provider, MD  aspirin 81 MG tablet Take 81 mg by mouth daily.    Historical Provider, MD  atorvastatin (LIPITOR) 10 MG tablet Take 10 mg by mouth daily.    Historical Provider, MD  bimatoprost (LUMIGAN) 0.01 % SOLN 1 drop at bedtime.    Historical Provider, MD  Blood Glucose Calibration (ACCU-CHEK SMARTVIEW CONTROL) LIQD Use as directed. 01/21/15   Margarita Rana, MD  Blood Glucose Monitoring Suppl (ACCU-CHEK NANO SMARTVIEW) W/DEVICE KIT To check blood sugar once a day. 01/21/15   Margarita Rana, MD  Calcium Carbonate-Vitamin D (CALCIUM 600 + D PO) Take 1 capsule by mouth daily.    Historical Provider, MD  escitalopram (LEXAPRO) 20 MG tablet Take 20 mg by mouth daily.    Historical Provider, MD  fish oil-omega-3 fatty acids 1000 MG capsule Take 2 g by mouth daily.    Historical Provider, MD  gabapentin (NEURONTIN) 300 MG capsule TAKE TWO CAPSULES BY MOUTH IN THE EVENING 02/16/15   Margarita Rana, MD  glucose blood test strip To check blood sugar once a day. 01/21/15   Margarita Rana, MD  meloxicam (MOBIC) 15 MG tablet Take 15 mg by mouth daily.    Historical Provider, MD  Niacin (VITAMIN B-3 PO) Take 1,000 mg by mouth.    Historical Provider, MD  omeprazole (PRILOSEC) 20  MG capsule Take 20 mg by mouth 2 (two) times daily.    Historical Provider, MD  pyridoxine (B-6) 100 MG tablet Take 100 mg by mouth daily.    Historical Provider, MD  Tetrahydrozoline-Zn Sulfate (EYE DROPS A/C OP) Apply to eye.    Historical Provider, MD  vitamin B-12 (CYANOCOBALAMIN) 1000 MCG tablet Take 1,000 mcg by mouth daily.    Historical Provider, MD  vitamin E 400 UNIT capsule Take 400 Units by mouth daily.    Historical Provider, MD    Allergies as of 02/01/2015  . (No Known Allergies)    Family History  Problem Relation Age of Onset  . Ovarian cancer Sister   . Lymphoma Father   . Colon cancer Brother   . Lymphoma Brother     History   Social History  . Marital Status: Married    Spouse Name: N/A  .  Number of Children: N/A  . Years of Education: N/A   Occupational History  . Not on file.   Social History Main Topics  . Smoking status: Former Smoker    Types: Cigarettes  . Smokeless tobacco: Never Used  . Alcohol Use: No  . Drug Use: No  . Sexual Activity: Not on file   Other Topics Concern  . Not on file   Social History Narrative    Review of Systems: See HPI, otherwise negative ROS  Physical Exam: BP 127/83 mmHg  Pulse 79  Temp(Src) 97 F (36.1 C) (Tympanic)  Resp 18  Ht 5' 7" (1.702 m)  Wt 102.513 kg (226 lb)  BMI 35.39 kg/m2  SpO2 99% General:   Alert,  pleasant and cooperative in NAD Head:  Normocephalic and atraumatic. Neck:  Supple; no masses or thyromegaly. Lungs:  Clear throughout to auscultation.    Heart:  Regular rate and rhythm. Abdomen:  Soft, nontender and nondistended. Normal bowel sounds, without guarding, and without rebound.   Neurologic:  Alert and  oriented x4;  grossly normal neurologically.  Impression/Plan: [name] is here for an colonoscopy to be performed for family hx of colon cancer and personal hx of colon polyps. Risks, benefits, limitations, and alternatives regarding  colonosocpy have been reviewed with the patient.  Questions have been answered.  All parties agreeable.   Arnold Kester, Lupita Dawn, MD  02/25/2015, 9:57 AM

## 2015-02-25 NOTE — Anesthesia Postprocedure Evaluation (Signed)
  Anesthesia Post-op Note  Patient: Leah Schaefer  Procedure(s) Performed: Procedure(s): COLONOSCOPY WITH PROPOFOL (N/A)  Anesthesia type:General  Patient location: PACU  Post pain: Pain level controlled  Post assessment: Post-op Vital signs reviewed, Patient's Cardiovascular Status Stable, Respiratory Function Stable, Patent Airway and No signs of Nausea or vomiting  Post vital signs: Reviewed and stable  Last Vitals:  Filed Vitals:   02/25/15 1100  BP: 86/44  Pulse: 69  Temp: 36.1 C  Resp: 15    Level of consciousness: awake, alert  and patient cooperative  Complications: No apparent anesthesia complications

## 2015-02-25 NOTE — Anesthesia Preprocedure Evaluation (Signed)
Anesthesia Evaluation  Patient identified by MRN, date of birth, ID band Patient awake    Reviewed: Allergy & Precautions, NPO status , Patient's Chart, lab work & pertinent test results  History of Anesthesia Complications (+) history of anesthetic complications  Airway Mallampati: III       Dental  (+) Upper Dentures   Pulmonary former smoker,    Pulmonary exam normal       Cardiovascular hypertension, Pt. on home beta blockers Normal cardiovascular exam    Neuro/Psych    GI/Hepatic Neg liver ROS,   Endo/Other  diabetes, Well Controlled, Type 2  Renal/GU negative Renal ROS     Musculoskeletal negative musculoskeletal ROS (+)   Abdominal (+) + obese,   Peds  Hematology negative hematology ROS (+)   Anesthesia Other Findings   Reproductive/Obstetrics negative OB ROS                             Anesthesia Physical Anesthesia Plan  ASA: III  Anesthesia Plan: General   Post-op Pain Management:    Induction: Intravenous  Airway Management Planned: Nasal Cannula  Additional Equipment:   Intra-op Plan:   Post-operative Plan:   Informed Consent: I have reviewed the patients History and Physical, chart, labs and discussed the procedure including the risks, benefits and alternatives for the proposed anesthesia with the patient or authorized representative who has indicated his/her understanding and acceptance.     Plan Discussed with: CRNA  Anesthesia Plan Comments:         Anesthesia Quick Evaluation

## 2015-03-01 ENCOUNTER — Encounter: Payer: Self-pay | Admitting: Gastroenterology

## 2015-03-30 ENCOUNTER — Ambulatory Visit (INDEPENDENT_AMBULATORY_CARE_PROVIDER_SITE_OTHER): Payer: Commercial Managed Care - HMO | Admitting: Family Medicine

## 2015-03-30 ENCOUNTER — Encounter: Payer: Self-pay | Admitting: Family Medicine

## 2015-03-30 VITALS — BP 124/80 | HR 84 | Temp 98.3°F | Resp 16 | Wt 229.0 lb

## 2015-03-30 DIAGNOSIS — I10 Essential (primary) hypertension: Secondary | ICD-10-CM

## 2015-03-30 DIAGNOSIS — E119 Type 2 diabetes mellitus without complications: Secondary | ICD-10-CM

## 2015-03-30 DIAGNOSIS — I779 Disorder of arteries and arterioles, unspecified: Secondary | ICD-10-CM | POA: Insufficient documentation

## 2015-03-30 DIAGNOSIS — R202 Paresthesia of skin: Secondary | ICD-10-CM | POA: Insufficient documentation

## 2015-03-30 DIAGNOSIS — I739 Peripheral vascular disease, unspecified: Secondary | ICD-10-CM

## 2015-03-30 DIAGNOSIS — E1140 Type 2 diabetes mellitus with diabetic neuropathy, unspecified: Secondary | ICD-10-CM | POA: Diagnosis not present

## 2015-03-30 DIAGNOSIS — J309 Allergic rhinitis, unspecified: Secondary | ICD-10-CM | POA: Insufficient documentation

## 2015-03-30 DIAGNOSIS — R591 Generalized enlarged lymph nodes: Secondary | ICD-10-CM | POA: Insufficient documentation

## 2015-03-30 DIAGNOSIS — M792 Neuralgia and neuritis, unspecified: Secondary | ICD-10-CM | POA: Insufficient documentation

## 2015-03-30 DIAGNOSIS — E7800 Pure hypercholesterolemia, unspecified: Secondary | ICD-10-CM | POA: Insufficient documentation

## 2015-03-30 DIAGNOSIS — F419 Anxiety disorder, unspecified: Secondary | ICD-10-CM | POA: Insufficient documentation

## 2015-03-30 DIAGNOSIS — M79605 Pain in left leg: Secondary | ICD-10-CM | POA: Insufficient documentation

## 2015-03-30 DIAGNOSIS — M545 Low back pain, unspecified: Secondary | ICD-10-CM | POA: Insufficient documentation

## 2015-03-30 DIAGNOSIS — E118 Type 2 diabetes mellitus with unspecified complications: Secondary | ICD-10-CM | POA: Insufficient documentation

## 2015-03-30 DIAGNOSIS — C50919 Malignant neoplasm of unspecified site of unspecified female breast: Secondary | ICD-10-CM | POA: Insufficient documentation

## 2015-03-30 DIAGNOSIS — R599 Enlarged lymph nodes, unspecified: Secondary | ICD-10-CM | POA: Insufficient documentation

## 2015-03-30 DIAGNOSIS — M5430 Sciatica, unspecified side: Secondary | ICD-10-CM | POA: Insufficient documentation

## 2015-03-30 LAB — POCT GLYCOSYLATED HEMOGLOBIN (HGB A1C): Hemoglobin A1C: 7.1

## 2015-03-30 LAB — POCT UA - MICROALBUMIN: MICROALBUMIN (UR) POC: NEGATIVE mg/L

## 2015-03-30 MED ORDER — GABAPENTIN 300 MG PO CAPS: 600.0000 mg | ORAL_CAPSULE | Freq: Every day | ORAL | Status: DC

## 2015-03-30 NOTE — Progress Notes (Signed)
Subjective:    Patient ID: Leah Schaefer, female    DOB: August 13, 1940, 74 y.o.   MRN: [id number]  Diabetes She presents for her follow-up diabetic visit. She has type 2 diabetes mellitus. Her disease course has been stable. Hypoglycemia symptoms include sweats. Associated symptoms include foot paresthesias. Pertinent negatives for diabetes include no blurred vision, no chest pain, no fatigue, no foot ulcerations, no polydipsia, no polyphagia, no polyuria, no visual change, no weakness and no weight loss. Symptoms are stable. Diabetic complications include nephropathy. Risk factors for coronary artery disease include diabetes mellitus, hypertension and dyslipidemia. She is compliant with treatment all of the time. Her weight is stable. Her overall blood glucose range is 140-180 mg/dl.  Hypertension This is a chronic problem. The problem is controlled. Associated symptoms include sweats. Pertinent negatives include no anxiety, blurred vision, chest pain, malaise/fatigue, neck pain, orthopnea, palpitations, peripheral edema, PND or shortness of breath. Risk factors for coronary artery disease include diabetes mellitus and dyslipidemia. There are no compliance problems.   Hyperlipidemia This is a chronic problem. The problem is controlled. Recent lipid tests were reviewed and are normal. Associated symptoms include leg pain and myalgias (Still having trouble with muscle cramps in her legs and back.). Pertinent negatives include no chest pain or shortness of breath. There are no compliance problems.  Risk factors for coronary artery disease include diabetes mellitus, dyslipidemia and hypertension.    Patient Active Problem List   Diagnosis Date Noted  . Allergic rhinitis 03/30/2015  . Anxiety 03/30/2015  . Breast CA 03/30/2015  . Carotid arterial disease 03/30/2015  . Diabetes mellitus, type 2 03/30/2015  . Essential (primary) hypertension 03/30/2015  . Hypercholesteremia 03/30/2015  . Left leg  pain 03/30/2015  . Adenopathy 03/30/2015  . LBP (low back pain) 03/30/2015  . Neuropathic pain 03/30/2015  . Burning or prickling sensation 03/30/2015  . Neuralgia neuritis, sciatic nerve 03/30/2015  . Diabetes 01/08/2015  . Skin cyst 11/24/2014  . Breast cancer of upper-outer quadrant of left female breast 08/17/2011  . Allergic reaction 11/29/2009  . Adaptation reaction 04/30/2009  . Acid reflux 04/30/2009  . 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 Ovarian cancer Sister   . Lymphoma Father   . Colon cancer Brother   . Lymphoma Brother    Social History   Social History  . Marital Status: Married    Spouse Name: N/A  . Number of Children: 2  . Years of Education: H/S   Occupational History  . Retired    Social History Main Topics  . Smoking status: Never Smoker   . Smokeless tobacco: Never Used  . Alcohol Use: No  . Drug Use: No  . Sexual Activity: Not on file   Other Topics Concern  . Not on file   Social History Narrative   No Known Allergies Previous Medications   ACCU-CHEK FASTCLIX LANCETS MISC    To check blood sugar once a day.   ACETAMINOPHEN (TYLENOL) 500 MG TABLET    Take 500 mg by mouth every 6 (six) hours as needed for pain.   ACYCLOVIR (ZOVIRAX) 200 MG CAPSULE       ALCOHOL SWABS (B-D SINGLE USE SWABS REGULAR) PADS    To check blood sugar once a day.   ALPRAZOLAM (XANAX) 0.25 MG TABLET    Take by mouth.   AMITRIPTYLINE (ELAVIL) 10 MG TABLET    Take by mouth.   ASCORBIC ACID (  VITAMIN C) 100 MG TABLET    Take 100 mg by mouth daily.   ASPIRIN 81 MG TABLET    Take 81 mg by mouth daily.   ATORVASTATIN (LIPITOR) 10 MG TABLET    Take 10 mg by mouth daily.   BIMATOPROST (LUMIGAN) 0.01 % SOLN    1 drop at bedtime.   BLOOD GLUCOSE CALIBRATION (ACCU-CHEK SMARTVIEW CONTROL) LIQD    Use as directed.   BLOOD GLUCOSE MONITORING SUPPL (ACCU-CHEK NANO SMARTVIEW) W/DEVICE KIT    To check blood sugar once a day.    CALCIUM CARBONATE-VITAMIN D (CALCIUM 600 + D PO)    Take 1 capsule by mouth daily.   ESCITALOPRAM (LEXAPRO) 20 MG TABLET    Take 20 mg by mouth daily.   FISH OIL-OMEGA-3 FATTY ACIDS 1000 MG CAPSULE    Take 2 g by mouth daily.   GABAPENTIN (NEURONTIN) 300 MG CAPSULE    TAKE TWO CAPSULES BY MOUTH IN THE EVENING   GLUCOSE BLOOD TEST STRIP    To check blood sugar once a day.   HYDROCODONE-ACETAMINOPHEN (NORCO/VICODIN) 5-325 MG PER TABLET    Take 1 tablet by mouth every 6 (six) hours as needed for moderate pain.   LORATADINE (CLARITIN) 10 MG TABLET    Take by mouth.   MELOXICAM (MOBIC) 15 MG TABLET    Take 15 mg by mouth daily.   METOPROLOL TARTRATE (LOPRESSOR) 25 MG TABLET    Take 25 mg by mouth 2 (two) times daily.   MULTIPLE VITAMINS-MINERALS (CENTRUM SILVER ULTRA WOMENS PO)    Take 1 tablet by mouth daily.   NAPROXEN (NAPROSYN) 500 MG TABLET    Take by mouth.   NIACIN (VITAMIN B-3 PO)    Take 1,000 mg by mouth.   OMEPRAZOLE (PRILOSEC) 20 MG CAPSULE    Take 20 mg by mouth 2 (two) times daily.   PYRIDOXINE (B-6) 100 MG TABLET    Take 100 mg by mouth daily.   TETRAHYDROZOLINE-ZN SULFATE (EYE DROPS A/C OP)    Apply to eye.   VITAMIN B-12 (CYANOCOBALAMIN) 1000 MCG TABLET    Take 1,000 mcg by mouth daily.   VITAMIN E 400 UNIT CAPSULE    Take 400 Units by mouth daily.   BP 124/80 mmHg  Pulse 84  Temp(Src) 98.3 F (36.8 C) (Oral)  Resp 16  Wt 229 lb (103.874 kg)    Review of Systems  Constitutional: Positive for diaphoresis. Negative for fever, chills, weight loss, malaise/fatigue, activity change, appetite change, fatigue and unexpected weight change.  Eyes: Negative for blurred vision.  Respiratory: Negative.  Negative for shortness of breath.   Cardiovascular: Negative.  Negative for chest pain, palpitations, orthopnea and PND.  Gastrointestinal: Negative.   Endocrine: Negative for polydipsia, polyphagia and polyuria.  Musculoskeletal: Positive for myalgias (Still having trouble with  muscle cramps in her legs and back.) and back pain. Negative for neck pain.  Neurological: Negative.  Negative for weakness.       Objective:   Physical Exam  Constitutional: She is oriented to person, place, and time. She appears well-developed.  Cardiovascular: Normal rate and regular rhythm.   Pulmonary/Chest: Effort normal and breath sounds normal.  Neurological: She is alert and oriented to person, place, and time.  Psychiatric: She has a normal mood and affect. Her behavior is normal. Judgment and thought content normal.  Vitals reviewed.  Diabetic Foot Exam - Simple   Simple Foot Form  Diabetic Foot exam was performed with the following findings:  Yes 03/30/2015  3:15 PM  Visual Inspection  No deformities, no ulcerations, no other skin breakdown bilaterally:  Yes  Sensation Testing  Intact to touch and monofilament testing bilaterally:  Yes  Pulse Check  Posterior Tibialis and Dorsalis pulse intact bilaterally:  Yes  Comments      BP 124/80 mmHg  Pulse 84  Temp(Src) 98.3 F (36.8 C) (Oral)  Resp 16  Wt 229 lb (103.874 kg)      Assessment & Plan:  1. Essential (primary) hypertension Condition is stable. Please continue current medication and  plan of care as noted.  Recheck in 3 months.  - TSH  2. Hypercholesteremia Stable. Continue current medication.  - Lipid panel - CBC with Differential/Platelet  3. Type 2 diabetes mellitus without complication Stable. Continue current medication and recheck in 3 months.  - POCT glycosylated hemoglobin (Hb A1C) - POCT UA - Microalbumin - Comprehensive metabolic panel  Results for orders placed or performed in visit on 03/30/15  POCT glycosylated hemoglobin (Hb A1C)  Result Value Ref Range   Hemoglobin A1C 7.1   POCT UA - Microalbumin  Result Value Ref Range   Microalbumin Ur, POC Negative mg/L     Margarita Rana, MD

## 2015-04-05 DIAGNOSIS — M5136 Other intervertebral disc degeneration, lumbar region: Secondary | ICD-10-CM | POA: Diagnosis not present

## 2015-04-05 DIAGNOSIS — E119 Type 2 diabetes mellitus without complications: Secondary | ICD-10-CM | POA: Diagnosis not present

## 2015-04-05 DIAGNOSIS — I10 Essential (primary) hypertension: Secondary | ICD-10-CM | POA: Diagnosis not present

## 2015-04-05 DIAGNOSIS — M9903 Segmental and somatic dysfunction of lumbar region: Secondary | ICD-10-CM | POA: Diagnosis not present

## 2015-04-05 DIAGNOSIS — M9904 Segmental and somatic dysfunction of sacral region: Secondary | ICD-10-CM | POA: Diagnosis not present

## 2015-04-05 DIAGNOSIS — M791 Myalgia: Secondary | ICD-10-CM | POA: Diagnosis not present

## 2015-04-05 DIAGNOSIS — M5442 Lumbago with sciatica, left side: Secondary | ICD-10-CM | POA: Diagnosis not present

## 2015-04-05 DIAGNOSIS — E780 Pure hypercholesterolemia: Secondary | ICD-10-CM | POA: Diagnosis not present

## 2015-04-06 ENCOUNTER — Telehealth: Payer: Self-pay

## 2015-04-06 LAB — COMPREHENSIVE METABOLIC PANEL
A/G RATIO: 1.5 (ref 1.1–2.5)
ALT: 23 IU/L (ref 0–32)
AST: 23 IU/L (ref 0–40)
Albumin: 4.3 g/dL (ref 3.5–4.8)
Alkaline Phosphatase: 84 IU/L (ref 39–117)
BUN/Creatinine Ratio: 13 (ref 11–26)
BUN: 11 mg/dL (ref 8–27)
Bilirubin Total: 0.3 mg/dL (ref 0.0–1.2)
CO2: 27 mmol/L (ref 18–29)
Calcium: 9.9 mg/dL (ref 8.7–10.3)
Chloride: 99 mmol/L (ref 97–108)
Creatinine, Ser: 0.88 mg/dL (ref 0.57–1.00)
GFR calc Af Amer: 75 mL/min/{1.73_m2} (ref >59)
GFR, EST NON AFRICAN AMERICAN: 65 mL/min/{1.73_m2} (ref >59)
Globulin, Total: 2.9 g/dL (ref 1.5–4.5)
Glucose: 155 mg/dL — ABNORMAL HIGH (ref 65–99)
POTASSIUM: 4.4 mmol/L (ref 3.5–5.2)
Sodium: 142 mmol/L (ref 134–144)
TOTAL PROTEIN: 7.2 g/dL (ref 6.0–8.5)

## 2015-04-06 LAB — LIPID PANEL
Chol/HDL Ratio: 3.3 ratio units (ref 0.0–4.4)
Cholesterol, Total: 136 mg/dL (ref 100–199)
HDL: 41 mg/dL (ref >39)
LDL CALC: 74 mg/dL (ref 0–99)
Triglycerides: 103 mg/dL (ref 0–149)
VLDL Cholesterol Cal: 21 mg/dL (ref 5–40)

## 2015-04-06 LAB — CBC WITH DIFFERENTIAL/PLATELET
BASOS ABS: 0 10*3/uL (ref 0.0–0.2)
Basos: 0 %
EOS (ABSOLUTE): 0.1 10*3/uL (ref 0.0–0.4)
Eos: 3 %
Hematocrit: 45.5 % (ref 34.0–46.6)
Hemoglobin: 15.5 g/dL (ref 11.1–15.9)
IMMATURE GRANS (ABS): 0 10*3/uL (ref 0.0–0.1)
Immature Granulocytes: 0 %
LYMPHS: 37 %
Lymphocytes Absolute: 2 10*3/uL (ref 0.7–3.1)
MCH: 30 pg (ref 26.6–33.0)
MCHC: 34.1 g/dL (ref 31.5–35.7)
MCV: 88 fL (ref 79–97)
MONOCYTES: 8 %
Monocytes Absolute: 0.4 10*3/uL (ref 0.1–0.9)
NEUTROS ABS: 2.7 10*3/uL (ref 1.4–7.0)
NEUTROS PCT: 52 %
PLATELETS: 224 10*3/uL (ref 150–379)
RBC: 5.17 x10E6/uL (ref 3.77–5.28)
RDW: 12.9 % (ref 12.3–15.4)
WBC: 5.2 10*3/uL (ref 3.4–10.8)

## 2015-04-06 LAB — TSH: TSH: 3.25 u[IU]/mL (ref 0.450–4.500)

## 2015-04-06 NOTE — Telephone Encounter (Signed)
-----   Message from Margarita Rana, MD sent at 04/06/2015  8:28 AM EDT ----- Labs stable. Continue current medication. Thanks.

## 2015-04-06 NOTE — Telephone Encounter (Signed)
Pt advised as directed below.   Thanks,   -Radford Pease  

## 2015-04-08 DIAGNOSIS — M9903 Segmental and somatic dysfunction of lumbar region: Secondary | ICD-10-CM | POA: Diagnosis not present

## 2015-04-08 DIAGNOSIS — M791 Myalgia: Secondary | ICD-10-CM | POA: Diagnosis not present

## 2015-04-08 DIAGNOSIS — M5442 Lumbago with sciatica, left side: Secondary | ICD-10-CM | POA: Diagnosis not present

## 2015-04-08 DIAGNOSIS — M9904 Segmental and somatic dysfunction of sacral region: Secondary | ICD-10-CM | POA: Diagnosis not present

## 2015-04-08 DIAGNOSIS — M5136 Other intervertebral disc degeneration, lumbar region: Secondary | ICD-10-CM | POA: Diagnosis not present

## 2015-04-13 DIAGNOSIS — M9903 Segmental and somatic dysfunction of lumbar region: Secondary | ICD-10-CM | POA: Diagnosis not present

## 2015-04-13 DIAGNOSIS — M5442 Lumbago with sciatica, left side: Secondary | ICD-10-CM | POA: Diagnosis not present

## 2015-04-13 DIAGNOSIS — M9904 Segmental and somatic dysfunction of sacral region: Secondary | ICD-10-CM | POA: Diagnosis not present

## 2015-04-13 DIAGNOSIS — M791 Myalgia: Secondary | ICD-10-CM | POA: Diagnosis not present

## 2015-04-13 DIAGNOSIS — M5136 Other intervertebral disc degeneration, lumbar region: Secondary | ICD-10-CM | POA: Diagnosis not present

## 2015-04-15 DIAGNOSIS — M791 Myalgia: Secondary | ICD-10-CM | POA: Diagnosis not present

## 2015-04-15 DIAGNOSIS — M9904 Segmental and somatic dysfunction of sacral region: Secondary | ICD-10-CM | POA: Diagnosis not present

## 2015-04-15 DIAGNOSIS — M5136 Other intervertebral disc degeneration, lumbar region: Secondary | ICD-10-CM | POA: Diagnosis not present

## 2015-04-15 DIAGNOSIS — M9903 Segmental and somatic dysfunction of lumbar region: Secondary | ICD-10-CM | POA: Diagnosis not present

## 2015-04-15 DIAGNOSIS — M5442 Lumbago with sciatica, left side: Secondary | ICD-10-CM | POA: Diagnosis not present

## 2015-04-29 DIAGNOSIS — H524 Presbyopia: Secondary | ICD-10-CM | POA: Diagnosis not present

## 2015-04-29 DIAGNOSIS — H5210 Myopia, unspecified eye: Secondary | ICD-10-CM | POA: Diagnosis not present

## 2015-05-13 ENCOUNTER — Ambulatory Visit: Payer: Self-pay | Admitting: Radiation Oncology

## 2015-05-14 ENCOUNTER — Encounter: Payer: Self-pay | Admitting: Radiation Oncology

## 2015-05-14 ENCOUNTER — Inpatient Hospital Stay: Payer: Commercial Managed Care - HMO | Attending: Internal Medicine

## 2015-05-14 ENCOUNTER — Ambulatory Visit
Admission: RE | Admit: 2015-05-14 | Discharge: 2015-05-14 | Disposition: A | Discharge status: Home / Self Care | Payer: Commercial Managed Care - HMO | Source: Ambulatory Visit | Attending: Radiation Oncology | Admitting: Radiation Oncology

## 2015-05-14 ENCOUNTER — Inpatient Hospital Stay (HOSPITAL_BASED_OUTPATIENT_CLINIC_OR_DEPARTMENT_OTHER): Payer: Commercial Managed Care - HMO | Admitting: Internal Medicine

## 2015-05-14 VITALS — BP 148/100 | HR 88 | Temp 96.6°F | Resp 18 | Wt 226.7 lb

## 2015-05-14 DIAGNOSIS — D213 Benign neoplasm of connective and other soft tissue of thorax: Secondary | ICD-10-CM

## 2015-05-14 DIAGNOSIS — N3090 Cystitis, unspecified without hematuria: Secondary | ICD-10-CM | POA: Diagnosis not present

## 2015-05-14 DIAGNOSIS — E119 Type 2 diabetes mellitus without complications: Secondary | ICD-10-CM | POA: Insufficient documentation

## 2015-05-14 DIAGNOSIS — Z7982 Long term (current) use of aspirin: Secondary | ICD-10-CM

## 2015-05-14 DIAGNOSIS — Z9221 Personal history of antineoplastic chemotherapy: Secondary | ICD-10-CM | POA: Diagnosis not present

## 2015-05-14 DIAGNOSIS — T451X5S Adverse effect of antineoplastic and immunosuppressive drugs, sequela: Secondary | ICD-10-CM | POA: Diagnosis not present

## 2015-05-14 DIAGNOSIS — I10 Essential (primary) hypertension: Secondary | ICD-10-CM | POA: Diagnosis not present

## 2015-05-14 DIAGNOSIS — K469 Unspecified abdominal hernia without obstruction or gangrene: Secondary | ICD-10-CM | POA: Diagnosis not present

## 2015-05-14 DIAGNOSIS — C50412 Malignant neoplasm of upper-outer quadrant of left female breast: Secondary | ICD-10-CM

## 2015-05-14 DIAGNOSIS — C50912 Malignant neoplasm of unspecified site of left female breast: Secondary | ICD-10-CM

## 2015-05-14 DIAGNOSIS — G620 Drug-induced polyneuropathy: Secondary | ICD-10-CM | POA: Insufficient documentation

## 2015-05-14 DIAGNOSIS — Z79899 Other long term (current) drug therapy: Secondary | ICD-10-CM | POA: Diagnosis not present

## 2015-05-14 DIAGNOSIS — Z8041 Family history of malignant neoplasm of ovary: Secondary | ICD-10-CM

## 2015-05-14 DIAGNOSIS — Z800 Family history of malignant neoplasm of digestive organs: Secondary | ICD-10-CM | POA: Diagnosis not present

## 2015-05-14 DIAGNOSIS — Z853 Personal history of malignant neoplasm of breast: Secondary | ICD-10-CM | POA: Insufficient documentation

## 2015-05-14 LAB — HEPATIC FUNCTION PANEL
ALT: 26 U/L (ref 14–54)
AST: 35 U/L (ref 15–41)
Albumin: 3.9 g/dL (ref 3.5–5.0)
Alkaline Phosphatase: 71 U/L (ref 38–126)
Bilirubin, Direct: <0.1 mg/dL — ABNORMAL LOW (ref 0.1–0.5)
Indirect Bilirubin: UNDETERMINED mg/dL (ref 0.3–0.9)
Total Bilirubin: 0.6 mg/dL (ref 0.3–1.2)
Total Protein: 7.2 g/dL (ref 6.5–8.1)

## 2015-05-14 LAB — CBC WITH DIFFERENTIAL/PLATELET
Basophils Absolute: 0 10*3/uL (ref 0–0.1)
Basophils Relative: 0 %
EOS PCT: 5 %
Eosinophils Absolute: 0.2 10*3/uL (ref 0–0.7)
HCT: 42.9 % (ref 35.0–47.0)
HEMOGLOBIN: 14.7 g/dL (ref 12.0–16.0)
LYMPHS ABS: 1.4 10*3/uL (ref 1.0–3.6)
LYMPHS PCT: 31 %
MCH: 30.4 pg (ref 26.0–34.0)
MCHC: 34.3 g/dL (ref 32.0–36.0)
MCV: 88.8 fL (ref 80.0–100.0)
MONOS PCT: 8 %
Monocytes Absolute: 0.4 10*3/uL (ref 0.2–0.9)
Neutro Abs: 2.5 10*3/uL (ref 1.4–6.5)
Neutrophils Relative %: 56 %
PLATELETS: 209 10*3/uL (ref 150–440)
RBC: 4.83 MIL/uL (ref 3.80–5.20)
RDW: 13.1 % (ref 11.5–14.5)
WBC: 4.5 10*3/uL (ref 3.6–11.0)

## 2015-05-14 LAB — CREATININE, SERUM
Creatinine, Ser: 0.75 mg/dL (ref 0.44–1.00)
GFR calc Af Amer: >60 mL/min (ref >60)
GFR calc non Af Amer: >60 mL/min (ref >60)

## 2015-05-14 NOTE — Progress Notes (Signed)
Radiation Oncology Follow up Note  Name: Leah Schaefer   Date:   05/14/2015 MRN:  [id number] DOB: Mar 23, 1941    This 74 y.o. 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 She was treated on NSABP protocol be 48 with adjuvant Herceptin seen today in routine follow-up doing well. Follow-up mammograms have been fine not on aromatase inhibitor based on the triple negative nature of her disease. She specifically denies breast tenderness cough or bone pain..  COMPLICATIONS OF TREATMENT: none  FOLLOW UP COMPLIANCE: keeps appointments   PHYSICAL EXAM:  BP 148/100 mmHg  Pulse 88  Temp(Src) 96.6 F (35.9 C)  Resp 18  Wt 226 lb 11.9 oz (102.85 kg) Lungs are clear to A&P cardiac examination essentially unremarkable with regular rate and rhythm. No dominant mass or nodularity is noted in either breast in 2 positions examined. Incision is well-healed. No axillary or supraclavicular adenopathy is appreciated. Cosmetic result is good. There is some retraction towards the scar although the cosmetic result is still good to excellent. Well-developed well-nourished patient in NAD. HEENT reveals PERLA, EOMI, discs not visualized.  Oral cavity is clear. No oral mucosal lesions are identified. Neck is clear without evidence of cervical or supraclavicular adenopathy. Lungs are clear to A&P. Cardiac examination is essentially unremarkable with regular rate and rhythm without murmur rub or thrill. Abdomen is benign with no organomegaly or masses noted. Motor sensory and DTR levels are equal and symmetric in the upper and lower extremities. Cranial nerves II through XII are grossly intact. Proprioception is intact. No peripheral adenopathy or edema is  identified. No motor or sensory levels are noted. Crude visual fields are within normal range.  RADIOLOGY RESULTS: Mammograms are requested for my review  PLAN: Present time she continues to do well with no evidence of disease. We'll see her one more time and then discontinue follow-up care. Patient has been having some problems with apparent orthostatic hypotension. I've asked her to make an appointment with her PMD for possible hypertensive medication patient adjustment. Patient knows to call with any concerns.  I would like to take this opportunity for allowing me to participate in the care of your patient.Armstead Peaks., MD

## 2015-05-15 LAB — CA 27.29 (SERIAL MONITOR): CA 27.29: 17.6 U/mL (ref 0.0–38.6)

## 2015-05-18 NOTE — Progress Notes (Signed)
[place]  Telephone:(336) [phone] Fax:(336) [phone]     ID: Leah Schaefer OB: 01-Mar-1941  MR#: [id number]  ZMO#:[id number]  Patient Care Team: Margarita Rana, MD as PCP - General (Family Medicine) Robert Bellow, MD (General Surgery) Margarita Rana, MD as Referring Physician 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 Tumor size 2.3 cm, grade 3, margins negative. One sentinel lymph node negative for metastasis. ER negative (0%), PR negative (0%), HER-2/neu negative by FISH.  Patient on adjuvant chemotherapy - got AC x 4, then completed weekly Taxol+Herceptin on 02/19/12.  Then completed Herceptin on 11/25/12 (enrolled on NSABP-B47 trial. Her2neu was 1+ on IHC).   HPI:   Patient returns for continued oncology followup for breast cancer as described above, she was seen in Jan 2016. She has triple negative disease and has completed treatment in July 2013 with Taxol in April 2014 with Herceptin. States she is doing steady. Eating well, denies unintentional weight loss. Denies any new dyspnea, orthopnea, PND, or leg swelling. Denies feeling any new breast masses on self-exam. No new bone pains.   Review of Systems   As in HPI above. In addition, no fevers, chills. No new headaches or focal weakness. No cough, shortness of breath, sputum, hemoptysis or chest pain. No new bone pain. No skin rash. No bleeding symptoms. No new mood disturbances.  No polyuria polydipsia. Performance status ECOG 1.  ROS PAST MEDICAL HISTORY: Reviewed. Past Medical History  Diagnosis Date  . Hernia 2011  . Cystitis   . 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 Personal history of malignant neoplasm of breast   . Malignant neoplasm of upper-inner quadrant of female breast ([place]) 2013    left  . Hypertension   .  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  PAST SURGICAL HISTORY: Reviewed. Past Surgical History  Procedure Laterality Date  . Colonoscopy  2011    Dr Candace Cruise  . Hernia repair Right 2009    inguinal, and umbilical  . Cholecystectomy  2007  . Dilation and curettage of uterus  2004  . Portacath placement  2013  . Upper gi endoscopy  2011  . Breast surgery Left 2012    wide excision  . Breast surgery Left November 2013    Core biopsy of the upper-outer quadrant showed fat necrosis.  . Colonoscopy with propofol N/A 02/25/2015    Procedure: COLONOSCOPY WITH PROPOFOL;  Surgeon: Hulen Luster, MD;  Location: [facility] ENDOSCOPY;  Service: Gastroenterology;  Laterality: N/A;   FAMILY HISTORY: Reviewed. Family History  Problem Relation Age of Onset  . Ovarian cancer Sister   . Lymphoma Father   . Colon cancer Brother   . Lymphoma Brother    SOCIAL HISTORY: Reviewed. Social History  Substance Use Topics  . Smoking status: Never Smoker   . Smokeless tobacco: Never Used  . Alcohol Use: No   No Known Allergies  Current Outpatient Prescriptions  Medication Sig Dispense Refill  . ACCU-CHEK FASTCLIX LANCETS MISC To check blood sugar once a day. 102 each 3  . acetaminophen (TYLENOL) 500 MG tablet Take 500 mg by mouth every 6 (six) hours as needed for pain.    Marland Kitchen acyclovir (ZOVIRAX)  200 MG capsule     . Alcohol Swabs (B-D SINGLE USE SWABS REGULAR) PADS To check blood sugar once a day. 100 each 3  . ALPRAZolam (XANAX) 0.25 MG tablet Take by mouth.    Marland Kitchen amitriptyline (ELAVIL) 10 MG tablet Take by mouth.    . Ascorbic Acid (VITAMIN C) 100 MG tablet Take 100 mg by mouth daily.    Marland Kitchen aspirin 81 MG tablet Take 81 mg by mouth daily.    Marland Kitchen atorvastatin (LIPITOR) 10 MG tablet Take 10 mg by mouth daily.    . bimatoprost (LUMIGAN) 0.01 % SOLN 1 drop at bedtime.    . Blood Glucose  Calibration (ACCU-CHEK SMARTVIEW CONTROL) LIQD Use as directed. 1 each 3  . Blood Glucose Monitoring Suppl (ACCU-CHEK NANO SMARTVIEW) W/DEVICE KIT To check blood sugar once a day. 1 kit 0  . Calcium Carbonate-Vitamin D (CALCIUM 600 + D PO) Take 1 capsule by mouth daily.    Marland Kitchen escitalopram (LEXAPRO) 20 MG tablet Take 20 mg by mouth daily.    . fish oil-omega-3 fatty acids 1000 MG capsule Take 2 g by mouth daily.    Marland Kitchen gabapentin (NEURONTIN) 300 MG capsule Take 2 capsules (600 mg total) by mouth at bedtime. 180 capsule 3  . glucose blood test strip To check blood sugar once a day. 100 each 3  . HYDROcodone-acetaminophen (NORCO/VICODIN) 5-325 MG per tablet Take 1 tablet by mouth every 6 (six) hours as needed for moderate pain.    Marland Kitchen loratadine (CLARITIN) 10 MG tablet Take by mouth.    . meloxicam (MOBIC) 15 MG tablet Take 15 mg by mouth daily.    . metoprolol tartrate (LOPRESSOR) 25 MG tablet Take 25 mg by mouth 2 (two) times daily.    . Multiple Vitamins-Minerals (CENTRUM SILVER ULTRA WOMENS PO) Take 1 tablet by mouth daily.    . naproxen (NAPROSYN) 500 MG tablet Take by mouth.    . Niacin (VITAMIN B-3 PO) Take 1,000 mg by mouth.    Marland Kitchen omeprazole (PRILOSEC) 20 MG capsule Take 20 mg by mouth 2 (two) times daily.    Marland Kitchen pyridoxine (B-6) 100 MG tablet Take 100 mg by mouth daily.    Bethann Humble Sulfate (EYE DROPS A/C OP) Apply to eye.    . vitamin B-12 (CYANOCOBALAMIN) 1000 MCG tablet Take 1,000 mcg by mouth daily.    . vitamin E 400 UNIT capsule Take 400 Units by mouth daily.     No current facility-administered medications for this visit.    PHYSICAL EXAM: GENERAL: Alert and oriented and in no acute distress. No icterus. HEENT: EOMs intact. No cervical lymphadenopathy. CVS: S1S2, regular LUNGS: Bilaterally clear to auscultation, no rhonchi. ABDOMEN: Soft, nontender. No hepatomegaly clinically.  BREASTS: exam deferred per patient request, had exam by Dr.Chrystal today. EXTREMITIES: No  pedal edema.  LAB RESULTS:    Component Value Date/Time   NA 142 04/05/2015 0857   NA 140 12/23/2012 0949   K 4.4 04/05/2015 0857   K 4.7 12/23/2012 0949   CL 99 04/05/2015 0857   CL 101 12/23/2012 0949   CO2 27 04/05/2015 0857   CO2 30 12/23/2012 0949   GLUCOSE 155* 04/05/2015 0857   GLUCOSE 168* 12/23/2012 0949   BUN 11 04/05/2015 0857   BUN 9 12/23/2012 0949   CREATININE 0.75 05/14/2015 0933   CREATININE 0.92 09/04/2014 0916   CALCIUM 9.9 04/05/2015 0857   CALCIUM 9.8 12/23/2012 0949   PROT 7.2 05/14/2015 0933   PROT 7.2  04/05/2015 0857   PROT 7.4 09/04/2014 0916   ALBUMIN 3.9 05/14/2015 0933   ALBUMIN 4.3 04/05/2015 0857   ALBUMIN 3.7 09/04/2014 0916   AST 35 05/14/2015 0933   AST 21 09/04/2014 0916   ALT 26 05/14/2015 0933   ALT 34 09/04/2014 0916   ALKPHOS 71 05/14/2015 0933   ALKPHOS 85 09/04/2014 0916   BILITOT 0.6 05/14/2015 0933   BILITOT 0.3 04/05/2015 0857   BILITOT 0.4 09/04/2014 0916   GFRNONAA >60 05/14/2015 0933   GFRNONAA >60 09/04/2014 0916   GFRNONAA >60 02/18/2014 1048   GFRAA >60 05/14/2015 0933   GFRAA >60 09/04/2014 0916   GFRAA >60 02/18/2014 1048    Lab Results  Component Value Date   WBC 4.5 05/14/2015   NEUTROABS 2.5 05/14/2015   HGB 14.7 05/14/2015   HCT 42.9 05/14/2015   MCV 88.8 05/14/2015   PLT 209 05/14/2015    05/14/15 - serum CA 27.29 level normal at 17.6.   STUDIES: 09/14/14 - Mammogram.  IMPRESSION:  Stable post lumpectomy changes of the upper central left breast.  RECOMMENDATION:  Recommend continued annual bilateral diagnostic mammographic evaluation.  BI-RADS CATEGORY  2: Benign.   IMPRESSION / RECOMMENDATIONS: 1. 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 Tumor size 2.3 cm, grade 3, margins negative. One sentinel lymph node negative for metastasis. ER negative (0%), PR negative (0%), HER-2/neu negative by FISH. Status post  breast radiation. Completed planned Herceptin therapy on 11/25/12 (on clinical trial NSABP-B47)  -  Have reviewed labs from today and discussed with patient. Doing well clinically without any evidence to suggest recurrent or metastatic breast cancer. Tumor was ER/PR negative, therefore no role for adjuvant hormonal therapy. Patient states she visits with Dr. Bary Castilla after mammogram next month, will therefore followup here in about 8 months with repeat labs including CBC, creatinine, LFT, CA 27.29 level. 2. 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  3. In between visits, patient advised to call or come to ER in case of any new breast masses felt on self exam, unintentional weight loss, new symptoms or acute sickness. She is agreeable to this plan.    Leia Alf, MD   05/18/2015 1:46 PM

## 2015-05-20 ENCOUNTER — Other Ambulatory Visit: Payer: Self-pay | Admitting: Family Medicine

## 2015-05-20 DIAGNOSIS — I10 Essential (primary) hypertension: Secondary | ICD-10-CM

## 2015-05-20 DIAGNOSIS — M545 Low back pain: Secondary | ICD-10-CM

## 2015-05-20 DIAGNOSIS — H401131 Primary open-angle glaucoma, bilateral, mild stage: Secondary | ICD-10-CM | POA: Diagnosis not present

## 2015-05-20 DIAGNOSIS — F419 Anxiety disorder, unspecified: Secondary | ICD-10-CM

## 2015-05-20 DIAGNOSIS — E7800 Pure hypercholesterolemia, unspecified: Secondary | ICD-10-CM

## 2015-05-20 DIAGNOSIS — K219 Gastro-esophageal reflux disease without esophagitis: Secondary | ICD-10-CM

## 2015-05-26 ENCOUNTER — Other Ambulatory Visit: Payer: Self-pay | Admitting: Family Medicine

## 2015-05-26 DIAGNOSIS — M79606 Pain in leg, unspecified: Secondary | ICD-10-CM | POA: Insufficient documentation

## 2015-05-26 MED ORDER — HYDROCODONE-ACETAMINOPHEN 5-325 MG PO TABS: 1.0000 | ORAL_TABLET | Freq: Four times a day (QID) | ORAL | Status: DC | PRN

## 2015-05-26 NOTE — Telephone Encounter (Signed)
Prescription printed. Please notify patient it is ready for pick up. Thanks- Dr. Greyson Peavy.  

## 2015-05-26 NOTE — Telephone Encounter (Signed)
She was prescribed Hydrocodone in [employer].  Thanks,   -Mickel Baas

## 2015-05-26 NOTE — Telephone Encounter (Signed)
Pt stated that she would like to get medication for her leg pain but she can not remember what she took for the pain. Pt would like to get a refill. Thanks TNP

## 2015-05-29 ENCOUNTER — Ambulatory Visit (INDEPENDENT_AMBULATORY_CARE_PROVIDER_SITE_OTHER): Payer: Commercial Managed Care - HMO

## 2015-05-29 DIAGNOSIS — Z23 Encounter for immunization: Secondary | ICD-10-CM

## 2015-07-05 ENCOUNTER — Encounter: Payer: Self-pay | Admitting: Family Medicine

## 2015-07-05 ENCOUNTER — Ambulatory Visit (INDEPENDENT_AMBULATORY_CARE_PROVIDER_SITE_OTHER): Payer: Commercial Managed Care - HMO | Admitting: Family Medicine

## 2015-07-05 VITALS — BP 126/88 | HR 78 | Temp 98.0°F | Resp 16 | Ht 66.0 in | Wt 229.0 lb

## 2015-07-05 DIAGNOSIS — M545 Low back pain: Secondary | ICD-10-CM | POA: Diagnosis not present

## 2015-07-05 DIAGNOSIS — E7800 Pure hypercholesterolemia, unspecified: Secondary | ICD-10-CM

## 2015-07-05 DIAGNOSIS — E119 Type 2 diabetes mellitus without complications: Secondary | ICD-10-CM | POA: Diagnosis not present

## 2015-07-05 DIAGNOSIS — I10 Essential (primary) hypertension: Secondary | ICD-10-CM | POA: Diagnosis not present

## 2015-07-05 LAB — POCT GLYCOSYLATED HEMOGLOBIN (HGB A1C): Hemoglobin A1C: 6.9
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
## 2015-07-21 ENCOUNTER — Other Ambulatory Visit: Payer: Self-pay | Admitting: *Deleted

## 2015-07-21 DIAGNOSIS — C50412 Malignant neoplasm of upper-outer quadrant of left female breast: Secondary | ICD-10-CM

## 2015-08-08 DIAGNOSIS — Z923 Personal history of irradiation: Secondary | ICD-10-CM

## 2015-08-08 HISTORY — PX: BREAST EXCISIONAL BIOPSY: SUR124

## 2015-08-08 HISTORY — DX: Personal history of irradiation: Z92.3

## 2015-09-16 ENCOUNTER — Ambulatory Visit
Admission: RE | Admit: 2015-09-16 | Discharge: 2015-09-16 | Disposition: A | Discharge status: Home / Self Care | Payer: Commercial Managed Care - HMO | Source: Ambulatory Visit | Attending: General Surgery | Admitting: General Surgery

## 2015-09-16 ENCOUNTER — Other Ambulatory Visit: Payer: Self-pay | Admitting: General Surgery

## 2015-09-16 DIAGNOSIS — C50412 Malignant neoplasm of upper-outer quadrant of left female breast: Secondary | ICD-10-CM

## 2015-09-16 DIAGNOSIS — C50919 Malignant neoplasm of unspecified site of unspecified female breast: Secondary | ICD-10-CM | POA: Diagnosis not present

## 2015-09-16 DIAGNOSIS — N63 Unspecified lump in breast: Secondary | ICD-10-CM | POA: Diagnosis not present

## 2015-09-16 HISTORY — DX: Malignant neoplasm of unspecified site of unspecified female breast: C50.919

## 2015-09-20 ENCOUNTER — Telehealth: Payer: Self-pay | Admitting: General Surgery

## 2015-09-20 NOTE — Telephone Encounter (Signed)
ERROR/MTH

## 2015-09-21 ENCOUNTER — Encounter: Payer: Self-pay | Admitting: General Surgery

## 2015-09-21 ENCOUNTER — Ambulatory Visit (INDEPENDENT_AMBULATORY_CARE_PROVIDER_SITE_OTHER): Payer: Commercial Managed Care - HMO | Admitting: General Surgery

## 2015-09-21 ENCOUNTER — Other Ambulatory Visit: Payer: Self-pay

## 2015-09-21 VITALS — BP 138/80 | HR 72 | Resp 14 | Ht 65.0 in | Wt 228.0 lb

## 2015-09-21 DIAGNOSIS — N6310 Unspecified lump in the right breast, unspecified quadrant: Secondary | ICD-10-CM

## 2015-09-21 DIAGNOSIS — N63 Unspecified lump in breast: Secondary | ICD-10-CM

## 2015-09-21 DIAGNOSIS — C50211 Malignant neoplasm of upper-inner quadrant of right female breast: Secondary | ICD-10-CM

## 2015-09-21 DIAGNOSIS — R928 Other abnormal and inconclusive findings on diagnostic imaging of breast: Secondary | ICD-10-CM | POA: Insufficient documentation

## 2015-09-21 DIAGNOSIS — C50412 Malignant neoplasm of upper-outer quadrant of left female breast: Secondary | ICD-10-CM

## 2015-09-21 DIAGNOSIS — N641 Fat necrosis of breast: Secondary | ICD-10-CM | POA: Diagnosis not present

## 2015-09-21 DIAGNOSIS — Z01419 Encounter for gynecological examination (general) (routine) without abnormal findings: Secondary | ICD-10-CM | POA: Diagnosis not present

## 2015-09-21 DIAGNOSIS — D0511 Intraductal carcinoma in situ of right breast: Secondary | ICD-10-CM | POA: Diagnosis not present

## 2015-09-21 HISTORY — DX: Malignant neoplasm of upper-inner quadrant of right female breast: C50.211

## 2015-09-21 HISTORY — PX: BREAST BIOPSY: SHX20

## 2015-09-21 NOTE — Progress Notes (Signed)
Patient ID: Leah Schaefer, female   DOB: 1940/12/25, 75 y.o.   MRN: [id number]  Chief Complaint  Patient presents with  . Follow-up    mammogram    HPI Leah Schaefer is a 75 y.o. female.  who presents for a breast evaluation. The most recent mammogram was done on 09-16-15. This is a routine follow-up exam or a woman now 4 years status post management of a left breast cancer with wide excision and postoperative radiation.   Patient does perform regular self breast checks and gets regular mammograms done. No new breast complaints.   There is no history of trauma to the breast.  I personally reviewed the patient's history.  HPI  Past Medical History  Diagnosis Date  . Hernia 2011  . Cystitis   . 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 Personal history of malignant neoplasm of breast   . Hypertension   . Diabetes mellitus without complication ([place])   . 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 Breast cancer [facility]) 2013    Left breast    Past Surgical History  Procedure Laterality Date  . Colonoscopy  2011    Dr Candace Cruise  . Hernia repair Right 2009    inguinal, and umbilical  . Cholecystectomy  2007  . Dilation and curettage of uterus  2004  . Portacath placement  2013  . Upper gi endoscopy  2011  . Breast surgery Left 2012    wide excision  . Breast surgery Left November 2013    Core biopsy of the upper-outer quadrant showed fat necrosis.  . Colonoscopy with propofol N/A 02/25/2015    Procedure: COLONOSCOPY WITH PROPOFOL;  Surgeon: Hulen Luster, MD;  Location: [facility] ENDOSCOPY;  Service: Gastroenterology;  Laterality: N/A;  . Breast biopsy Left 2013    Family History  Problem Relation Age of Onset  . Ovarian cancer Sister   . Lymphoma Father   . Colon cancer Brother   . Lymphoma Brother   . Breast cancer Neg Hx     Social History Social History  Substance Use Topics  . Smoking  status: Never Smoker   . Smokeless tobacco: Never Used  . Alcohol Use: No    No Known Allergies  Current Outpatient Prescriptions  Medication Sig Dispense Refill  . ACCU-CHEK FASTCLIX LANCETS MISC To check blood sugar once a day. 102 each 3  . acetaminophen (TYLENOL) 500 MG tablet Take 500 mg by mouth every 6 (six) hours as needed for pain.    Marland Kitchen acyclovir (ZOVIRAX) 200 MG capsule TAKE 1 CAPSULE EVERY DAY 90 capsule 3  . Alcohol Swabs (B-D SINGLE USE SWABS REGULAR) PADS To check blood sugar once a day. 100 each 3  . ALPRAZolam (XANAX) 0.25 MG tablet Take 0.25 mg by mouth at bedtime as needed.     Marland Kitchen amitriptyline (ELAVIL) 10 MG tablet Take 10 mg by mouth at bedtime.     . Ascorbic Acid (VITAMIN C) 100 MG tablet Take 100 mg by mouth daily.    Marland Kitchen aspirin 81 MG tablet Take 81 mg by mouth daily.    Marland Kitchen atorvastatin (LIPITOR) 10 MG tablet TAKE 1 TABLET EVERY EVENING 90 tablet 3  . bimatoprost (LUMIGAN) 0.01 % SOLN 1 drop at bedtime.    . Blood Glucose Calibration (ACCU-CHEK SMARTVIEW CONTROL) LIQD Use as directed. 1 each 3  . Blood Glucose Monitoring Suppl (ACCU-CHEK  NANO SMARTVIEW) W/DEVICE KIT To check blood sugar once a day. 1 kit 0  . Calcium Carbonate-Vitamin D (CALCIUM 600 + D PO) Take 1 capsule by mouth daily.    Marland Kitchen escitalopram (LEXAPRO) 20 MG tablet TAKE 1 TABLET EVERY DAY 90 tablet 3  . fish oil-omega-3 fatty acids 1000 MG capsule Take 2 g by mouth daily.    Marland Kitchen gabapentin (NEURONTIN) 300 MG capsule Take 2 capsules (600 mg total) by mouth at bedtime. 180 capsule 3  . glucose blood test strip To check blood sugar once a day. 100 each 3  . HYDROcodone-acetaminophen (NORCO/VICODIN) 5-325 MG tablet Take 1 tablet by mouth every 6 (six) hours as needed for moderate pain. 60 tablet 0  . loratadine (CLARITIN) 10 MG tablet Take by mouth.    . meloxicam (MOBIC) 15 MG tablet TAKE 1 TABLET EVERY DAY 90 tablet 3  . metoprolol tartrate (LOPRESSOR) 25 MG tablet TAKE 1/2 TABLET TWICE DAILY 90 tablet 3  .  Multiple Vitamins-Minerals (CENTRUM SILVER ULTRA WOMENS PO) Take 1 tablet by mouth daily.    . naproxen (NAPROSYN) 500 MG tablet Take by mouth.    Marland Kitchen omeprazole (PRILOSEC) 20 MG capsule TAKE 1 CAPSULE TWICE DAILY 180 capsule 3  . pyridoxine (B-6) 100 MG tablet Take 100 mg by mouth daily.    Bethann Humble Sulfate (EYE DROPS A/C OP) Apply to eye.    . vitamin B-12 (CYANOCOBALAMIN) 1000 MCG tablet Take 1,000 mcg by mouth daily.    . vitamin E 400 UNIT capsule Take 400 Units by mouth daily.     No current facility-administered medications for this visit.    Review of Systems Review of Systems  Constitutional: Negative.   Respiratory: Negative.   Cardiovascular: Negative.     Blood pressure 138/80, pulse 72, resp. rate 14, height '5\' 5"'  (1.651 m), weight 228 lb (103.42 kg).  Physical Exam Physical Exam  Constitutional: She is oriented to person, place, and time. She appears well-developed and well-nourished.  HENT:  Mouth/Throat: Oropharynx is clear and moist.  Eyes: Conjunctivae are normal. No scleral icterus.  Neck: Neck supple.  Cardiovascular: Normal rate, regular rhythm and normal heart sounds.   Pulmonary/Chest: Effort normal and breath sounds normal. Right breast exhibits no inverted nipple, no mass, no nipple discharge, no skin change and no tenderness. Left breast exhibits no inverted nipple, no mass, no nipple discharge, no skin change and no tenderness.    Thickening at 12 o'clock 4 CFN left breast and thickening at lateral wide excision site from 10 to 1 left breast  Lymphadenopathy:    She has no cervical adenopathy.    She has no axillary adenopathy.  Neurological: She is alert and oriented to person, place, and time.  Skin: Skin is warm and dry.  Psychiatric: Her behavior is normal.    Data Reviewed Bilateral mammograms dated 09/16/2015 were compared to previous studies. Identification of a 13 mm oval mass in the right upper inner breast. Focal changes in the  upper inner quadrant of the left breast for which biopsy was recommended. BI-RADS-4. Associated ultrasound reviewed.  Ultrasound examination of the right breast in the 2:00 position 3 cm from the nipple showed a well-defined smoothly marginated hypoechoic nodule with some faint posterior acoustic enhancement measuring 0.76 x 1.04 x 1.13 cm. No significant architectural distortion. BI-RADS-4.  The patient was amenable to core biopsy. 10 mL of 0.5% Xylocaine with 0.25% Marcaine with 1-200,000 units of epinephrine was utilized after alcohol prep. ChloraPrep was  applied to the skin. A 14-gauge Finesse device was then passed under ultrasound guidance. Approximately 12 core samples were obtained through 2 different locations. A postbiopsy clip was placed. Skin was closed with benzoin and Steri-Strips followed by Telfa and Tegaderm dressing. The procedure was well tolerated.  Ultrasound examination of the left breast was completed in firming the 2 previously identified areas in the treated breast. At the 10:00 position 4 cm from the nipple a 0.4 x 0.4 x 0.6 cm slightly irregular hypoechoic mass with posterior acoustic enhancement and a rim of hyperechoic tissue suggestive of fat necrosis was identified. BI-RADS-4. The patient was amenable to fine-needle aspiration cytology and this was completed after Xylocaine local anesthesia. Ultima passes were completed with a 22-gauge needle and a set of slides prepared for cytology review.  Attention was turned to the area the 12:00 position, 2 cm from the nipple which corresponded to the palpable thickening. The central hypoechoic area measured 0.5-0.6 x 0.85 cm and including the hyperechoic rim measured up to 1.3 cm in diameter. BI-RADS-4. Using 0.5% Xylocaine with 0.25% Marcaine in one to 200,000 epinephrine a 14-gauge Finesse biopsy was completed with 8 core samples obtained through the center of the lesion. A postbiopsy clip was placed. No bleeding was noted. The skin  defect was closed with benzoin and Steri-Strips followed by Telfa and Tegaderm dressing. The procedure was well tolerated.  Assessment    New right breast mass.  Changes consistent with fat necrosis status post whole breast radiation.    Plan    The patient was instructed in postbiopsy care. Should be contacted when biopsy results are available.     PCP:  Margarita Rana This information has been scribed by Karie Fetch [place].    Robert Bellow 09/21/2015, 12:13 PM

## 2015-09-21 NOTE — Patient Instructions (Signed)

## 2015-09-22 ENCOUNTER — Telehealth: Payer: Self-pay | Admitting: General Surgery

## 2015-09-22 NOTE — Telephone Encounter (Signed)
The patient was notified that the right breast biopsies and invasive mammary carcinoma. The left side showed fat necrosis as expected.  She reports having tolerated the biopsies well with no pain.  We'll plan to get together tomorrow morning at 8:30 AM to review options for management.

## 2015-09-23 ENCOUNTER — Telehealth: Payer: Self-pay | Admitting: *Deleted

## 2015-09-23 ENCOUNTER — Ambulatory Visit (INDEPENDENT_AMBULATORY_CARE_PROVIDER_SITE_OTHER): Payer: Commercial Managed Care - HMO | Admitting: General Surgery

## 2015-09-23 ENCOUNTER — Encounter: Payer: Self-pay | Admitting: General Surgery

## 2015-09-23 ENCOUNTER — Ambulatory Visit: Payer: Commercial Managed Care - HMO | Admitting: General Surgery

## 2015-09-23 VITALS — BP 148/86 | HR 72 | Resp 12 | Ht 67.0 in | Wt 229.0 lb

## 2015-09-23 DIAGNOSIS — C50211 Malignant neoplasm of upper-inner quadrant of right female breast: Secondary | ICD-10-CM

## 2015-09-23 DIAGNOSIS — C50412 Malignant neoplasm of upper-outer quadrant of left female breast: Secondary | ICD-10-CM

## 2015-09-23 NOTE — Progress Notes (Signed)
Patient ID: Leah Schaefer, female   DOB: 16-May-1941, 75 y.o.   MRN: [id number]  Chief Complaint  Patient presents with  . Other    Discuss results    HPI Leah Schaefer is a 75 y.o. female here today to discuss biopsy results. Patient here today for follow up post breast biopsy's . Dressing still intact.Steristrip in place and aware it may come off in one week.  Minimal bruising noted on right breast   The patient is aware that a heating pad may be used for comfort as needed.  Aware of pathology.   I personally reviewed the patient's history. HPI  Past Medical History  Diagnosis Date  . Hernia 2011  . Cystitis   . 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 Personal history of malignant neoplasm of breast   . Hypertension   . Diabetes mellitus without complication ([place])   . 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 Breast cancer [facility]) 2013    Left breast    Past Surgical History  Procedure Laterality Date  . Colonoscopy  2011    Dr Candace Cruise  . Hernia repair Right 2009    inguinal, and umbilical  . Cholecystectomy  2007  . Dilation and curettage of uterus  2004  . Portacath placement  2013  . Upper gi endoscopy  2011  . Breast surgery Left 2012    wide excision  . Breast surgery Left November 2013    Core biopsy of the upper-outer quadrant showed fat necrosis.  . Colonoscopy with propofol N/A 02/25/2015    Procedure: COLONOSCOPY WITH PROPOFOL;  Surgeon: Hulen Luster, MD;  Location: [facility] ENDOSCOPY;  Service: Gastroenterology;  Laterality: N/A;  . Breast biopsy Left 2013    Family History  Problem Relation Age of Onset  . Ovarian cancer Sister   . Lymphoma Father   . Colon cancer Brother   . Lymphoma Brother   . Breast cancer Neg Hx     Social History Social History  Substance Use Topics  . Smoking status: Never Smoker   . Smokeless tobacco: Never Used  . Alcohol Use: No    No  Known Allergies  Current Outpatient Prescriptions  Medication Sig Dispense Refill  . ACCU-CHEK FASTCLIX LANCETS MISC To check blood sugar once a day. 102 each 3  . acetaminophen (TYLENOL) 500 MG tablet Take 500 mg by mouth every 6 (six) hours as needed for pain.    Marland Kitchen acyclovir (ZOVIRAX) 200 MG capsule TAKE 1 CAPSULE EVERY DAY 90 capsule 3  . Alcohol Swabs (B-D SINGLE USE SWABS REGULAR) PADS To check blood sugar once a day. 100 each 3  . ALPRAZolam (XANAX) 0.25 MG tablet Take 0.25 mg by mouth at bedtime as needed.     Marland Kitchen amitriptyline (ELAVIL) 10 MG tablet Take 10 mg by mouth at bedtime.     . Ascorbic Acid (VITAMIN C) 100 MG tablet Take 100 mg by mouth daily.    Marland Kitchen aspirin 81 MG tablet Take 81 mg by mouth daily.    Marland Kitchen atorvastatin (LIPITOR) 10 MG tablet TAKE 1 TABLET EVERY EVENING 90 tablet 3  . bimatoprost (LUMIGAN) 0.01 % SOLN 1 drop at bedtime.    . Blood Glucose Calibration (ACCU-CHEK SMARTVIEW CONTROL) LIQD Use as directed. 1 each 3  . Blood Glucose Monitoring Suppl (ACCU-CHEK NANO SMARTVIEW) W/DEVICE KIT To check blood sugar once a day.  1 kit 0  . Calcium Carbonate-Vitamin D (CALCIUM 600 + D PO) Take 1 capsule by mouth daily.    Marland Kitchen escitalopram (LEXAPRO) 20 MG tablet TAKE 1 TABLET EVERY DAY 90 tablet 3  . fish oil-omega-3 fatty acids 1000 MG capsule Take 2 g by mouth daily.    Marland Kitchen gabapentin (NEURONTIN) 300 MG capsule Take 2 capsules (600 mg total) by mouth at bedtime. 180 capsule 3  . glucose blood test strip To check blood sugar once a day. 100 each 3  . HYDROcodone-acetaminophen (NORCO/VICODIN) 5-325 MG tablet Take 1 tablet by mouth every 6 (six) hours as needed for moderate pain. 60 tablet 0  . loratadine (CLARITIN) 10 MG tablet Take by mouth.    . meloxicam (MOBIC) 15 MG tablet TAKE 1 TABLET EVERY DAY 90 tablet 3  . metoprolol tartrate (LOPRESSOR) 25 MG tablet TAKE 1/2 TABLET TWICE DAILY 90 tablet 3  . Multiple Vitamins-Minerals (CENTRUM SILVER ULTRA WOMENS PO) Take 1 tablet by mouth  daily.    . naproxen (NAPROSYN) 500 MG tablet Take by mouth.    Marland Kitchen omeprazole (PRILOSEC) 20 MG capsule TAKE 1 CAPSULE TWICE DAILY 180 capsule 3  . pyridoxine (B-6) 100 MG tablet Take 100 mg by mouth daily.    Bethann Humble Sulfate (EYE DROPS A/C OP) Apply to eye.    . vitamin B-12 (CYANOCOBALAMIN) 1000 MCG tablet Take 1,000 mcg by mouth daily.    . vitamin E 400 UNIT capsule Take 400 Units by mouth daily.     No current facility-administered medications for this visit.    Review of Systems Review of Systems  Constitutional: Negative.   Respiratory: Negative.   Cardiovascular: Negative.     Blood pressure 148/86, pulse 72, resp. rate 12, height '5\' 7"'  (1.702 m), weight 229 lb (103.874 kg).  Physical Exam Physical Exam Minimal bruising on the right biopsy site per staff report. Left side unremarkable.  Data Reviewed Core biopsy of the area of left breast 12:00 position: Fat necrosis. FNA of the 9:30 o'clock position of the left breast, no malignant cells (clinically consistent with fat necrosis).  Core biopsy of the lesion at 3:00 position new from 2016 showed invasive mammary carcinoma. Receptor status pending.  Assessment    Newly diagnosed right breast cancer.    Plan    Options for management were reviewed: 1) wide excision/sentinel node biopsy with postoperative radiation versus 2) mastectomy with sentinel node biopsy. These were presented as equivalent procedures.  Options for second surgical opinion reviewed.  The patient is an unlikely candidate for neoadjuvant chemotherapy as with her 2013 cancer based on size, but we will await hormone receptor and HER-2 results before proceeding to surgery in the event she would be a candidate for Herceptin/Perjeta.    Patient's surgery has been scheduled for 09-30-15 at [facility]. It is okay for patient to continue 81 mg aspirin once daily.     PCP:  Margarita Rana This information has been scribed by Gaspar Cola  CMA.    Robert Bellow 09/23/2015, 9:13 AM

## 2015-09-23 NOTE — Patient Instructions (Signed)
Patient's surgery has been scheduled for 09-30-15 at [facility].

## 2015-09-23 NOTE — Telephone Encounter (Signed)
Message for patient to call the office.   We need to inform patient of arrival time the day of surgery.

## 2015-09-23 NOTE — H&P (Signed)
HPI Leah Schaefer is a 75 y.o. female here today to discuss biopsy results. Patient here today for follow up post breast biopsy's . Dressing still intact.Steristrip in place and aware it may come off in one week. Minimal bruising noted on right breast The patient is aware that a heating pad may be used for comfort as needed. Aware of pathology.   I personally reviewed the patient's history. HPI  Past Medical History  Diagnosis Date  . Hernia 2011  . Cystitis   . 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 Personal history of malignant neoplasm of breast   . Hypertension   . Diabetes mellitus without complication ([place])   . 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 Breast cancer [facility]) 2013    Left breast    Past Surgical History  Procedure Laterality Date  . Colonoscopy  2011    Dr Candace Cruise  . Hernia repair Right 2009    inguinal, and umbilical  . Cholecystectomy  2007  . Dilation and curettage of uterus  2004  . Portacath placement  2013  . Upper gi endoscopy  2011  . Breast surgery Left 2012    wide excision  . Breast surgery Left November 2013    Core biopsy of the upper-outer quadrant showed fat necrosis.  . Colonoscopy with propofol N/A 02/25/2015    Procedure: COLONOSCOPY WITH PROPOFOL; Surgeon: Hulen Luster, MD; Location: [facility] ENDOSCOPY; Service: Gastroenterology; Laterality: N/A;  . Breast biopsy Left 2013    Family History  Problem Relation Age of Onset  . Ovarian cancer Sister   . Lymphoma Father   . Colon cancer Brother   . Lymphoma Brother   . Breast cancer Neg Hx     Social History Social History  Substance Use Topics  . Smoking status: Never Smoker   . Smokeless tobacco: Never Used  . Alcohol Use: No    No Known Allergies  Current  Outpatient Prescriptions  Medication Sig Dispense Refill  . ACCU-CHEK FASTCLIX LANCETS MISC To check blood sugar once a day. 102 each 3  . acetaminophen (TYLENOL) 500 MG tablet Take 500 mg by mouth every 6 (six) hours as needed for pain.    Marland Kitchen acyclovir (ZOVIRAX) 200 MG capsule TAKE 1 CAPSULE EVERY DAY 90 capsule 3  . Alcohol Swabs (B-D SINGLE USE SWABS REGULAR) PADS To check blood sugar once a day. 100 each 3  . ALPRAZolam (XANAX) 0.25 MG tablet Take 0.25 mg by mouth at bedtime as needed.     Marland Kitchen amitriptyline (ELAVIL) 10 MG tablet Take 10 mg by mouth at bedtime.     . Ascorbic Acid (VITAMIN C) 100 MG tablet Take 100 mg by mouth daily.    Marland Kitchen aspirin 81 MG tablet Take 81 mg by mouth daily.    Marland Kitchen atorvastatin (LIPITOR) 10 MG tablet TAKE 1 TABLET EVERY EVENING 90 tablet 3  . bimatoprost (LUMIGAN) 0.01 % SOLN 1 drop at bedtime.    . Blood Glucose Calibration (ACCU-CHEK SMARTVIEW CONTROL) LIQD Use as directed. 1 each 3  . Blood Glucose Monitoring Suppl (ACCU-CHEK NANO SMARTVIEW) W/DEVICE KIT To check blood sugar once a day. 1 kit 0  . Calcium Carbonate-Vitamin D (CALCIUM 600 + D PO) Take 1 capsule by mouth daily.    Marland Kitchen escitalopram (LEXAPRO) 20 MG tablet TAKE 1 TABLET EVERY DAY 90 tablet 3  . fish oil-omega-3 fatty acids  1000 MG capsule Take 2 g by mouth daily.    Marland Kitchen gabapentin (NEURONTIN) 300 MG capsule Take 2 capsules (600 mg total) by mouth at bedtime. 180 capsule 3  . glucose blood test strip To check blood sugar once a day. 100 each 3  . HYDROcodone-acetaminophen (NORCO/VICODIN) 5-325 MG tablet Take 1 tablet by mouth every 6 (six) hours as needed for moderate pain. 60 tablet 0  . loratadine (CLARITIN) 10 MG tablet Take by mouth.    . meloxicam (MOBIC) 15 MG tablet TAKE 1 TABLET EVERY DAY 90 tablet 3  . metoprolol tartrate (LOPRESSOR) 25 MG tablet TAKE 1/2 TABLET TWICE DAILY 90 tablet 3  .  Multiple Vitamins-Minerals (CENTRUM SILVER ULTRA WOMENS PO) Take 1 tablet by mouth daily.    . naproxen (NAPROSYN) 500 MG tablet Take by mouth.    Marland Kitchen omeprazole (PRILOSEC) 20 MG capsule TAKE 1 CAPSULE TWICE DAILY 180 capsule 3  . pyridoxine (B-6) 100 MG tablet Take 100 mg by mouth daily.    Bethann Humble Sulfate (EYE DROPS A/C OP) Apply to eye.    . vitamin B-12 (CYANOCOBALAMIN) 1000 MCG tablet Take 1,000 mcg by mouth daily.    . vitamin E 400 UNIT capsule Take 400 Units by mouth daily.     No current facility-administered medications for this visit.    Review of Systems Review of Systems  Constitutional: Negative.  Respiratory: Negative.  Cardiovascular: Negative.    Blood pressure 148/86, pulse 72, resp. rate 12, height '5\' 7"'  (1.702 m), weight 229 lb (103.874 kg).  Physical Exam Physical Exam Minimal bruising on the right biopsy site per staff report. Left side unremarkable.  Data Reviewed Core biopsy of the area of left breast 12:00 position: Fat necrosis. FNA of the 9:30 o'clock position of the left breast, no malignant cells (clinically consistent with fat necrosis).  Core biopsy of the lesion at 3:00 position new from 2016 showed invasive mammary carcinoma. Receptor status pending.  Assessment    Newly diagnosed right breast cancer.    Plan    Options for management were reviewed: 1) wide excision/sentinel node biopsy with postoperative radiation versus 2) mastectomy with sentinel node biopsy. These were presented as equivalent procedures.  Options for second surgical opinion reviewed.  The patient is an unlikely candidate for neoadjuvant chemotherapy as with her 2013 cancer based on size, but we will await hormone receptor and HER-2 results before proceeding to surgery in the event she would be a candidate for Herceptin/Perjeta.   Patient's surgery has been scheduled for 09-30-15 at [facility]. It is okay for patient to  continue 81 mg aspirin once daily.     PCP: Margarita Rana This information has been scribed by Gaspar Cola CMA.    Robert Bellow 09/23/2015, 9:13 AM

## 2015-09-23 NOTE — Telephone Encounter (Signed)
Patient has been notified of arrival time for 09-30-15 at [facility] and verbalizes understanding.

## 2015-09-27 ENCOUNTER — Other Ambulatory Visit: Payer: Commercial Managed Care - HMO

## 2015-09-27 ENCOUNTER — Encounter: Payer: Self-pay | Admitting: *Deleted

## 2015-09-27 DIAGNOSIS — I10 Essential (primary) hypertension: Secondary | ICD-10-CM | POA: Diagnosis not present

## 2015-09-27 NOTE — Patient Instructions (Signed)
  Your procedure is scheduled on: 09-30-15 (THURSDAY) Report to [place] (2ND DESK ON RIGHT) @ 10 AM    Remember: Instructions that are not followed completely may result in serious medical risk, up to and including death, or upon the discretion of your surgeon and anesthesiologist your surgery may need to be rescheduled.    _X___ 1. Do not eat food or drink liquids after midnight. No gum chewing or hard candies.     _X___ 2. No Alcohol for 24 hours before or after surgery.   ____ 3. Bring all medications with you on the day of surgery if instructed.    _X___ 4. Notify your doctor if there is any change in your medical condition     (cold, fever, infections).     Do not wear jewelry, make-up, hairpins, clips or nail polish.  Do not wear lotions, powders, or perfumes. You may wear deodorant.  Do not shave 48 hours prior to surgery. Men may shave face and neck.  Do not bring valuables to the hospital.    [facility] is not responsible for any belongings or valuables.               Contacts, dentures or bridgework may not be worn into surgery.  Leave your suitcase in the car. After surgery it may be brought to your room.  For patients admitted to the hospital, discharge time is determined by your treatment team.   Patients discharged the day of surgery will not be allowed to drive home.   Please read over the following fact sheets that you were given:      _X___ Take these medicines the morning of surgery with A SIP OF WATER:    1. LEXAPRO (ESCITALOPRAM)  2. METOPROLOL  3. PRILOSEC  4.  5.  6.  ____ Fleet Enema (as directed)   _X___ Use CHG Soap as directed  ____ Use inhalers on the day of surgery  ____ Stop metformin 2 days prior to surgery    ____ Take 1/2 of usual insulin dose the night before surgery and none on the morning of surgery.   ____ Stop Coumadin/Plavix/aspirin-OK TO CONTINUE 81 MG ASPIRIN-DO NOT TAKE THE MORNING OF SURGERY  ____ Stop  Anti-inflammatories   _X___ Stop supplements until after surgery-STOP VITAMIN C,E AND FISH OIL NOW   ____ Bring C-Pap to the hospital.

## 2015-09-28 ENCOUNTER — Encounter
Admission: RE | Admit: 2015-09-28 | Discharge: 2015-09-28 | Disposition: A | Discharge status: Home / Self Care | Payer: Commercial Managed Care - HMO | Source: Ambulatory Visit | Attending: General Surgery | Admitting: General Surgery

## 2015-09-28 ENCOUNTER — Telehealth: Payer: Self-pay | Admitting: *Deleted

## 2015-09-28 DIAGNOSIS — I10 Essential (primary) hypertension: Secondary | ICD-10-CM | POA: Insufficient documentation

## 2015-09-28 DIAGNOSIS — E119 Type 2 diabetes mellitus without complications: Secondary | ICD-10-CM | POA: Insufficient documentation

## 2015-09-28 DIAGNOSIS — C50919 Malignant neoplasm of unspecified site of unspecified female breast: Secondary | ICD-10-CM | POA: Insufficient documentation

## 2015-09-28 DIAGNOSIS — C50911 Malignant neoplasm of unspecified site of right female breast: Secondary | ICD-10-CM | POA: Diagnosis not present

## 2015-09-28 DIAGNOSIS — Z807 Family history of other malignant neoplasms of lymphoid, hematopoietic and related tissues: Secondary | ICD-10-CM | POA: Diagnosis not present

## 2015-09-28 DIAGNOSIS — Z170 Estrogen receptor positive status [ER+]: Secondary | ICD-10-CM | POA: Diagnosis not present

## 2015-09-28 DIAGNOSIS — L700 Acne vulgaris: Secondary | ICD-10-CM | POA: Diagnosis not present

## 2015-09-28 DIAGNOSIS — D0511 Intraductal carcinoma in situ of right breast: Secondary | ICD-10-CM | POA: Diagnosis not present

## 2015-09-28 DIAGNOSIS — Z7982 Long term (current) use of aspirin: Secondary | ICD-10-CM | POA: Diagnosis not present

## 2015-09-28 DIAGNOSIS — K219 Gastro-esophageal reflux disease without esophagitis: Secondary | ICD-10-CM | POA: Diagnosis not present

## 2015-09-28 DIAGNOSIS — Z79899 Other long term (current) drug therapy: Secondary | ICD-10-CM | POA: Diagnosis not present

## 2015-09-28 DIAGNOSIS — Z800 Family history of malignant neoplasm of digestive organs: Secondary | ICD-10-CM | POA: Diagnosis not present

## 2015-09-28 DIAGNOSIS — Z9049 Acquired absence of other specified parts of digestive tract: Secondary | ICD-10-CM | POA: Diagnosis not present

## 2015-09-28 DIAGNOSIS — I739 Peripheral vascular disease, unspecified: Secondary | ICD-10-CM | POA: Diagnosis not present

## 2015-09-28 DIAGNOSIS — R0789 Other chest pain: Secondary | ICD-10-CM | POA: Diagnosis not present

## 2015-09-28 DIAGNOSIS — Z8041 Family history of malignant neoplasm of ovary: Secondary | ICD-10-CM | POA: Diagnosis not present

## 2015-09-28 LAB — BASIC METABOLIC PANEL
Anion gap: 7 (ref 5–15)
BUN: 12 mg/dL (ref 6–20)
CALCIUM: 9 mg/dL (ref 8.9–10.3)
CO2: 26 mmol/L (ref 22–32)
CREATININE: 0.73 mg/dL (ref 0.44–1.00)
Chloride: 104 mmol/L (ref 101–111)
GFR calc Af Amer: >60 mL/min (ref >60)
Glucose, Bld: 201 mg/dL — ABNORMAL HIGH (ref 65–99)
POTASSIUM: 3.9 mmol/L (ref 3.5–5.1)
SODIUM: 137 mmol/L (ref 135–145)

## 2015-09-28 NOTE — Telephone Encounter (Signed)
She states she is doing well post biopsy and she is ready for surgery this week.

## 2015-09-28 NOTE — Telephone Encounter (Signed)
-----   Message from Carson Myrtle, RN sent at 09/21/2015  9:19 AM EST ----- Phone call follow up post bilateral breast biopsy.

## 2015-09-29 DIAGNOSIS — R0789 Other chest pain: Secondary | ICD-10-CM | POA: Diagnosis not present

## 2015-09-29 LAB — CANCER ANTIGEN 27.29: CA 27.29: 17.1 U/mL (ref 0.0–38.6)

## 2015-09-30 ENCOUNTER — Encounter
Admission: RE | Admit: 2015-09-30 | Discharge: 2015-09-30 | Disposition: A | Discharge status: Home / Self Care | Payer: Commercial Managed Care - HMO | Source: Ambulatory Visit | Attending: General Surgery | Admitting: General Surgery

## 2015-09-30 ENCOUNTER — Encounter
Admission: RE | Disposition: A | Discharge status: Home / Self Care | Payer: Self-pay | Source: Ambulatory Visit | Attending: General Surgery

## 2015-09-30 ENCOUNTER — Ambulatory Visit: Payer: Commercial Managed Care - HMO

## 2015-09-30 ENCOUNTER — Encounter: Payer: Self-pay | Admitting: *Deleted

## 2015-09-30 ENCOUNTER — Ambulatory Visit: Payer: Commercial Managed Care - HMO | Admitting: Registered Nurse

## 2015-09-30 ENCOUNTER — Ambulatory Visit
Admission: RE | Admit: 2015-09-30 | Discharge: 2015-09-30 | Disposition: A | Discharge status: Home / Self Care | Payer: Commercial Managed Care - HMO | Source: Ambulatory Visit | Attending: General Surgery | Admitting: General Surgery

## 2015-09-30 DIAGNOSIS — I739 Peripheral vascular disease, unspecified: Secondary | ICD-10-CM | POA: Diagnosis not present

## 2015-09-30 DIAGNOSIS — Z170 Estrogen receptor positive status [ER+]: Secondary | ICD-10-CM | POA: Diagnosis not present

## 2015-09-30 DIAGNOSIS — K219 Gastro-esophageal reflux disease without esophagitis: Secondary | ICD-10-CM | POA: Insufficient documentation

## 2015-09-30 DIAGNOSIS — Z9049 Acquired absence of other specified parts of digestive tract: Secondary | ICD-10-CM | POA: Diagnosis not present

## 2015-09-30 DIAGNOSIS — Z79899 Other long term (current) drug therapy: Secondary | ICD-10-CM | POA: Insufficient documentation

## 2015-09-30 DIAGNOSIS — L700 Acne vulgaris: Secondary | ICD-10-CM | POA: Diagnosis not present

## 2015-09-30 DIAGNOSIS — C50412 Malignant neoplasm of upper-outer quadrant of left female breast: Secondary | ICD-10-CM | POA: Diagnosis not present

## 2015-09-30 DIAGNOSIS — Z8041 Family history of malignant neoplasm of ovary: Secondary | ICD-10-CM | POA: Diagnosis not present

## 2015-09-30 DIAGNOSIS — Z800 Family history of malignant neoplasm of digestive organs: Secondary | ICD-10-CM | POA: Diagnosis not present

## 2015-09-30 DIAGNOSIS — Z9889 Other specified postprocedural states: Secondary | ICD-10-CM

## 2015-09-30 DIAGNOSIS — C50811 Malignant neoplasm of overlapping sites of right female breast: Secondary | ICD-10-CM | POA: Diagnosis not present

## 2015-09-30 DIAGNOSIS — C50211 Malignant neoplasm of upper-inner quadrant of right female breast: Secondary | ICD-10-CM

## 2015-09-30 DIAGNOSIS — E119 Type 2 diabetes mellitus without complications: Secondary | ICD-10-CM | POA: Diagnosis not present

## 2015-09-30 DIAGNOSIS — C50911 Malignant neoplasm of unspecified site of right female breast: Secondary | ICD-10-CM | POA: Diagnosis not present

## 2015-09-30 DIAGNOSIS — Z807 Family history of other malignant neoplasms of lymphoid, hematopoietic and related tissues: Secondary | ICD-10-CM | POA: Insufficient documentation

## 2015-09-30 DIAGNOSIS — I10 Essential (primary) hypertension: Secondary | ICD-10-CM | POA: Diagnosis not present

## 2015-09-30 DIAGNOSIS — Z7982 Long term (current) use of aspirin: Secondary | ICD-10-CM | POA: Insufficient documentation

## 2015-09-30 DIAGNOSIS — D0511 Intraductal carcinoma in situ of right breast: Secondary | ICD-10-CM | POA: Insufficient documentation

## 2015-09-30 HISTORY — DX: Gastro-esophageal reflux disease without esophagitis: K21.9

## 2015-09-30 HISTORY — PX: BREAST LUMPECTOMY: SHX2

## 2015-09-30 HISTORY — PX: BREAST LUMPECTOMY WITH SENTINEL LYMPH NODE BIOPSY: SHX5597

## 2015-09-30 LAB — GLUCOSE, CAPILLARY: Glucose-Capillary: 145 mg/dL — ABNORMAL HIGH (ref 65–99)

## 2015-09-30 SURGERY — BREAST LUMPECTOMY WITH SENTINEL LYMPH NODE BX
Anesthesia: General | Laterality: Right | Wound class: Clean

## 2015-09-30 MED ORDER — FENTANYL CITRATE (PF) 100 MCG/2ML IJ SOLN
INTRAMUSCULAR | Status: AC
  Filled 2015-09-30: qty 2

## 2015-09-30 MED ORDER — METHYLENE BLUE 0.5 % INJ SOLN
INTRAVENOUS | Status: DC | PRN
  Administered 2015-09-30: 4 mL via INTRADERMAL

## 2015-09-30 MED ORDER — ONDANSETRON HCL 4 MG/2ML IJ SOLN: 4.0000 mg | Freq: Once | INTRAMUSCULAR | Status: DC | PRN

## 2015-09-30 MED ORDER — FENTANYL CITRATE (PF) 100 MCG/2ML IJ SOLN
INTRAMUSCULAR | Status: DC | PRN
  Administered 2015-09-30: 25 ug via INTRAVENOUS
  Administered 2015-09-30: 50 ug via INTRAVENOUS
  Administered 2015-09-30: 25 ug via INTRAVENOUS

## 2015-09-30 MED ORDER — HYDROCODONE-ACETAMINOPHEN 5-325 MG PO TABS
1.0000 | ORAL_TABLET | ORAL | Status: DC | PRN
  Administered 2015-09-30: 1 via ORAL

## 2015-09-30 MED ORDER — GLYCOPYRROLATE 0.2 MG/ML IJ SOLN
INTRAMUSCULAR | Status: DC | PRN
  Administered 2015-09-30: 0.2 mg via INTRAVENOUS

## 2015-09-30 MED ORDER — HYDROCODONE-ACETAMINOPHEN 5-325 MG PO TABS: 1.0000 | ORAL_TABLET | ORAL | Status: DC | PRN

## 2015-09-30 MED ORDER — ACETAMINOPHEN 10 MG/ML IV SOLN
INTRAVENOUS | Status: DC | PRN
  Administered 2015-09-30: 1000 mg via INTRAVENOUS

## 2015-09-30 MED ORDER — MIDAZOLAM HCL 2 MG/2ML IJ SOLN
INTRAMUSCULAR | Status: DC | PRN
  Administered 2015-09-30: 2 mg via INTRAVENOUS

## 2015-09-30 MED ORDER — CEFAZOLIN SODIUM-DEXTROSE 2-3 GM-% IV SOLR
INTRAVENOUS | Status: AC
  Administered 2015-09-30: 2 g via INTRAVENOUS
  Filled 2015-09-30: qty 50

## 2015-09-30 MED ORDER — EPHEDRINE SULFATE 50 MG/ML IJ SOLN
INTRAMUSCULAR | Status: DC | PRN
  Administered 2015-09-30: 10 mg via INTRAVENOUS

## 2015-09-30 MED ORDER — SODIUM CHLORIDE 0.9 % IJ SOLN
INTRAMUSCULAR | Status: AC
  Filled 2015-09-30: qty 10

## 2015-09-30 MED ORDER — SODIUM CHLORIDE 0.9 % IV SOLN
INTRAVENOUS | Status: DC
  Administered 2015-09-30 (×2): via INTRAVENOUS

## 2015-09-30 MED ORDER — HYDROCODONE-ACETAMINOPHEN 5-325 MG PO TABS
ORAL_TABLET | ORAL | Status: AC
  Filled 2015-09-30: qty 1

## 2015-09-30 MED ORDER — FENTANYL CITRATE (PF) 100 MCG/2ML IJ SOLN
25.0000 ug | INTRAMUSCULAR | Status: AC | PRN
  Administered 2015-09-30 (×6): 25 ug via INTRAVENOUS

## 2015-09-30 MED ORDER — ONDANSETRON HCL 4 MG/2ML IJ SOLN
INTRAMUSCULAR | Status: DC | PRN
  Administered 2015-09-30: 4 mg via INTRAVENOUS

## 2015-09-30 MED ORDER — KETOROLAC TROMETHAMINE 30 MG/ML IJ SOLN
INTRAMUSCULAR | Status: DC | PRN
  Administered 2015-09-30: 30 mg via INTRAVENOUS

## 2015-09-30 MED ORDER — CEFAZOLIN SODIUM-DEXTROSE 2-3 GM-% IV SOLR
2.0000 g | INTRAVENOUS | Status: AC
  Administered 2015-09-30: 2 g via INTRAVENOUS

## 2015-09-30 MED ORDER — TECHNETIUM TC 99M SULFUR COLLOID
1.0010 | Freq: Once | INTRAVENOUS | Status: AC | PRN
  Administered 2015-09-30: 1.001 via INTRAVENOUS

## 2015-09-30 MED ORDER — ACETAMINOPHEN 10 MG/ML IV SOLN
INTRAVENOUS | Status: AC
  Filled 2015-09-30: qty 100

## 2015-09-30 MED ORDER — DEXAMETHASONE SODIUM PHOSPHATE 10 MG/ML IJ SOLN
INTRAMUSCULAR | Status: DC | PRN
  Administered 2015-09-30: 5 mg via INTRAVENOUS

## 2015-09-30 MED ORDER — METHYLENE BLUE 0.5 % INJ SOLN
INTRAVENOUS | Status: AC
  Filled 2015-09-30: qty 10

## 2015-09-30 MED ORDER — BUPIVACAINE-EPINEPHRINE (PF) 0.5% -1:200000 IJ SOLN
INTRAMUSCULAR | Status: DC | PRN
  Administered 2015-09-30: 30 mL

## 2015-09-30 MED ORDER — BUPIVACAINE-EPINEPHRINE (PF) 0.5% -1:200000 IJ SOLN
INTRAMUSCULAR | Status: AC
  Filled 2015-09-30: qty 30

## 2015-09-30 MED ORDER — PHENYLEPHRINE HCL 10 MG/ML IJ SOLN
INTRAMUSCULAR | Status: DC | PRN
  Administered 2015-09-30 (×3): 100 ug via INTRAVENOUS

## 2015-09-30 MED ORDER — PROPOFOL 10 MG/ML IV BOLUS
INTRAVENOUS | Status: DC | PRN
  Administered 2015-09-30: 180 mg via INTRAVENOUS

## 2015-09-30 MED ORDER — LIDOCAINE HCL (CARDIAC) 20 MG/ML IV SOLN
INTRAVENOUS | Status: DC | PRN
  Administered 2015-09-30: 100 mg via INTRAVENOUS

## 2015-09-30 SURGICAL SUPPLY — 51 items
BANDAGE ELASTIC 6 LF NS (GAUZE/BANDAGES/DRESSINGS) ×3 IMPLANT
BLADE SURG 15 STRL SS SAFETY (BLADE) ×3 IMPLANT
BNDG GAUZE 4.5X4.1 6PLY STRL (MISCELLANEOUS) ×3 IMPLANT
BULB RESERV EVAC DRAIN JP 100C (MISCELLANEOUS) IMPLANT
CANISTER SUCT 1200ML W/VALVE (MISCELLANEOUS) ×3 IMPLANT
CHLORAPREP W/TINT 26ML (MISCELLANEOUS) ×3 IMPLANT
CLOSURE WOUND 1/2 X4 (GAUZE/BANDAGES/DRESSINGS) ×1
CNTNR SPEC 2.5X3XGRAD LEK (MISCELLANEOUS) ×2
CONT SPEC 4OZ STER OR WHT (MISCELLANEOUS) ×4
CONTAINER SPEC 2.5X3XGRAD LEK (MISCELLANEOUS) ×2 IMPLANT
COVER PROBE FLX POLY STRL (MISCELLANEOUS) ×3 IMPLANT
DEVICE DUBIN SPECIMEN MAMMOGRA (MISCELLANEOUS) ×3 IMPLANT
DRAIN CHANNEL JP 15F RND 16 (MISCELLANEOUS) IMPLANT
DRAPE LAPAROTOMY TRNSV 106X77 (MISCELLANEOUS) ×3 IMPLANT
DRSG TELFA 3X8 NADH (GAUZE/BANDAGES/DRESSINGS) ×3 IMPLANT
ELECT CAUTERY BLADE TIP 2.5 (TIP) ×3
ELECT REM PT RETURN 9FT ADLT (ELECTROSURGICAL) ×3
ELECTRODE CAUTERY BLDE TIP 2.5 (TIP) ×1 IMPLANT
ELECTRODE REM PT RTRN 9FT ADLT (ELECTROSURGICAL) ×1 IMPLANT
GAUZE FLUFF 18X24 1PLY STRL (GAUZE/BANDAGES/DRESSINGS) ×3 IMPLANT
GAUZE SPONGE 4X4 12PLY STRL (GAUZE/BANDAGES/DRESSINGS) IMPLANT
GLOVE BIO SURGEON STRL SZ7.5 (GLOVE) ×12 IMPLANT
GLOVE INDICATOR 8.0 STRL GRN (GLOVE) ×9 IMPLANT
GOWN STRL REUS W/ TWL LRG LVL3 (GOWN DISPOSABLE) ×3 IMPLANT
GOWN STRL REUS W/TWL LRG LVL3 (GOWN DISPOSABLE) ×6
HARMONIC SCALPEL FOCUS (MISCELLANEOUS) IMPLANT
KIT RM TURNOVER STRD PROC AR (KITS) ×3 IMPLANT
LABEL OR SOLS (LABEL) ×3 IMPLANT
MARGIN MAP 10MM (MISCELLANEOUS) ×3 IMPLANT
NDL SAFETY 22GX1.5 (NEEDLE) ×3 IMPLANT
NEEDLE HYPO 25X1 1.5 SAFETY (NEEDLE) ×3 IMPLANT
PACK BASIN MINOR ARMC (MISCELLANEOUS) ×3 IMPLANT
SHEARS FOC LG CVD HARMONIC 17C (MISCELLANEOUS) IMPLANT
SLEVE PROBE SENORX GAMMA FIND (MISCELLANEOUS) ×3 IMPLANT
STRIP CLOSURE SKIN 1/2X4 (GAUZE/BANDAGES/DRESSINGS) ×2 IMPLANT
SUT ETHILON 3-0 FS-10 30 BLK (SUTURE) ×3
SUT SILK 2 0 (SUTURE) ×2
SUT SILK 2-0 18XBRD TIE 12 (SUTURE) ×1 IMPLANT
SUT VIC AB 2-0 CT1 27 (SUTURE) ×4
SUT VIC AB 2-0 CT1 TAPERPNT 27 (SUTURE) ×2 IMPLANT
SUT VIC AB 3-0 SH 27 (SUTURE) ×2
SUT VIC AB 3-0 SH 27X BRD (SUTURE) ×1 IMPLANT
SUT VIC AB 4-0 FS2 27 (SUTURE) ×3 IMPLANT
SUT VICRYL+ 3-0 144IN (SUTURE) ×3 IMPLANT
SUTURE EHLN 3-0 FS-10 30 BLK (SUTURE) ×1 IMPLANT
SWABSTK COMLB BENZOIN TINCTURE (MISCELLANEOUS) ×3 IMPLANT
SYR BULB IRRIG 60ML STRL (SYRINGE) ×3 IMPLANT
SYR CONTROL 10ML (SYRINGE) ×3 IMPLANT
SYRINGE 10CC LL (SYRINGE) ×3 IMPLANT
TAPE TRANSPORE STRL 2 31045 (GAUZE/BANDAGES/DRESSINGS) IMPLANT
WATER STERILE IRR 1000ML POUR (IV SOLUTION) ×3 IMPLANT

## 2015-09-30 NOTE — Op Note (Signed)
Preoperative diagnosis: Right breast cancer.  Postoperative diagnosis: Same.  Operative procedure: Right breast wide excision with mastoplasty, sentinel node biopsy.  Operating surgeon Hervey Ard, M.D.  Anesthesia: Gen. endotracheal. Marcaine 0.5% with 1-200,000 epinephrine, 30 mL  Past metabolic: Minimal.  Clinical note: This 75 year old woman recently had a mammogram showing a new finding in the right breast. 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 Given her options for management she desired to proceed to breast conservation.  Operative note. The patient was injected with technetiusulfur colloid prior to procedure. After induction of general anesthesia she was injected with 4 mL of normal saline/methylene blue diluted to do one into the subareolar plexus. The breast was prepped with ChloraPrep and draped. Attention was turned to the breast were ultrasound was used to confirm location of the mass in the 2-3:00 position. An curvilinear incision from the 12 to 3:00 position was outlined and the skin was incised sharply after the infiltration of local anesthetic. The skin was incised sharply and the remaining dissection completed with electrocautery. The adipose tissue was divided down to the beginning of the breast parenchyma at which point a 3 x 4 x 4 cm block of tissue including the pectoralis fascia was excised, orientated and sent for specimen radiograph. This confirmed the previously placed biopsy clip to be in the center of the specimen with the associated mass. Gross examination from pathology showed margins clear.  All pathology was pending attention was turned to the axilla. The gamma finder was used identified the area of increased uptake. Local anesthetic was infiltrated and a transverse incision at the lower level of the axillary completed. The generous layer of adipose tissue was divided. 3 hot, blue sentinel nodes are identified and sent for routine histology. All of  more of normal size. Hemostasis was electrocautery. The deep tissue was approximated with 2-0 Vicryl figure-of-eight sutures. The skin was closed with a running 4-0 Vicryl septic suture.  Attention was turned to closure of the breast site. The breast flaps are elevated elevating the breast off of the underlying pectoralis muscle taking the fascia with the breast parenchyma circumferentially for approximate 5 cm. This allowed easy approximation to fill the defect. This was completed with interrupted 2-0 Vicryl figure-of-eight sutures in multiple layers. The deep adipose layer was approximated with interrupted 3-0 Vicryl figure-of-eight sutures. These can on the lateral aspect of the incision was mobilized as a flap for about 3 cm to provide better apposition. The deep dermal layer was then approximated with a few interrupted 3-0 Vicryl sutures. The skin was then closed with a running 4-0 Vicryl subcuticular suture. Benzoin, Steri-Strips, Telfa dressing followed by a fluff gauze, Kerlix and compressive Ace wrap.   The patient tolerated the procedure well and was taken to recovery in stable condition

## 2015-09-30 NOTE — Discharge Instructions (Signed)

## 2015-09-30 NOTE — Anesthesia Preprocedure Evaluation (Signed)
Anesthesia Evaluation  Patient identified by MRN, date of birth, ID band Patient awake    Reviewed: Allergy & Precautions, NPO status , Patient's Chart, lab work & pertinent test results  History of Anesthesia Complications Negative for: history of anesthetic complications  Airway Mallampati: II       Dental  (+) Upper Dentures, Lower Dentures   Pulmonary neg pulmonary ROS,    Pulmonary exam normal        Cardiovascular Exercise Tolerance: Good hypertension, Pt. on home beta blockers + Peripheral Vascular Disease   Rhythm:Regular Rate:Normal     Neuro/Psych Anxiety    GI/Hepatic Neg liver ROS, GERD  ,  Endo/Other  diabetes, Type 2, Oral Hypoglycemic AgentsMorbid obesity  Renal/GU negative Renal ROS     Musculoskeletal negative musculoskeletal ROS (+)   Abdominal Normal abdominal exam  (+) + obese,   Peds negative pediatric ROS (+)  Hematology   Anesthesia Other Findings   Reproductive/Obstetrics                             Anesthesia Physical Anesthesia Plan  ASA: III  Anesthesia Plan: General   Post-op Pain Management:    Induction: Intravenous  Airway Management Planned: LMA  Additional Equipment:   Intra-op Plan:   Post-operative Plan: Extubation in OR  Informed Consent: I have reviewed the patients History and Physical, chart, labs and discussed the procedure including the risks, benefits and alternatives for the proposed anesthesia with the patient or authorized representative who has indicated his/her understanding and acceptance.     Plan Discussed with: CRNA  Anesthesia Plan Comments:         Anesthesia Quick Evaluation

## 2015-09-30 NOTE — Anesthesia Procedure Notes (Signed)
Procedure Name: LMA Insertion Date/Time: 09/30/2015 2:26 PM Performed by: Doreen Salvage Pre-anesthesia Checklist: Patient identified, Patient being monitored, Timeout performed, Emergency Drugs available and Suction available Patient Re-evaluated:Patient Re-evaluated prior to inductionOxygen Delivery Method: Circle system utilized Preoxygenation: Pre-oxygenation with 100% oxygen Intubation Type: IV induction Ventilation: Mask ventilation without difficulty LMA: LMA inserted LMA Size: 4.5 Tube type: Oral Number of attempts: 1 Placement Confirmation: positive ETCO2 and breath sounds checked- equal and bilateral Tube secured with: Tape Dental Injury: Teeth and Oropharynx as per pre-operative assessment

## 2015-09-30 NOTE — H&P (Signed)
No change in clinical history or exam. For right breast wide excision, SLN biopsy.  

## 2015-09-30 NOTE — OR Nursing (Signed)
I spoke with Dr. Bary Castilla and he reports it is okay to use patient's left arm for IV . He is aware patient had a lymph node removed from the left breast.

## 2015-09-30 NOTE — Transfer of Care (Signed)
Immediate Anesthesia Transfer of Care Note  Patient: Leah Schaefer  Procedure(s) Performed: Procedure(s): BREAST LUMPECTOMY WITH SENTINEL LYMPH NODE BX (Right)  Patient Location: PACU  Anesthesia Type:General  Level of Consciousness: sedated  Airway & Oxygen Therapy: Patient Spontanous Breathing and Patient connected to face mask oxygen  Post-op Assessment: Report given to RN  Post vital signs: Reviewed and stable  Last Vitals:  Filed Vitals:   09/30/15 1137 09/30/15 1554  BP: 144/81 126/74  Pulse: 75 87  Temp: 37 C 36.1 C  Resp: 16 21    Complications: No apparent anesthesia complications

## 2015-10-01 ENCOUNTER — Encounter: Payer: Self-pay | Admitting: General Surgery

## 2015-10-01 NOTE — Anesthesia Postprocedure Evaluation (Signed)
Anesthesia Post Note  Patient: Leah Schaefer  Procedure(s) Performed: Procedure(s) (LRB): BREAST LUMPECTOMY WITH SENTINEL LYMPH NODE BX (Right)  Patient location during evaluation: PACU Anesthesia Type: General Level of consciousness: awake Pain management: satisfactory to patient Vital Signs Assessment: post-procedure vital signs reviewed and stable Respiratory status: respiratory function stable Cardiovascular status: stable Anesthetic complications: no    Last Vitals:  Filed Vitals:   09/30/15 1745 09/30/15 1843  BP: 116/64 122/57  Pulse: 82 83  Temp: 35.8 C   Resp: 16 16    Last Pain:  Filed Vitals:   09/30/15 1845  PainSc: 5                  VAN STAVEREN,Syon Tews

## 2015-10-05 ENCOUNTER — Encounter: Payer: Self-pay | Admitting: General Surgery

## 2015-10-05 ENCOUNTER — Ambulatory Visit (INDEPENDENT_AMBULATORY_CARE_PROVIDER_SITE_OTHER): Payer: Commercial Managed Care - HMO | Admitting: Family Medicine

## 2015-10-05 ENCOUNTER — Ambulatory Visit (INDEPENDENT_AMBULATORY_CARE_PROVIDER_SITE_OTHER): Payer: Commercial Managed Care - HMO | Admitting: General Surgery

## 2015-10-05 ENCOUNTER — Encounter: Payer: Self-pay | Admitting: Family Medicine

## 2015-10-05 ENCOUNTER — Telehealth: Payer: Self-pay | Admitting: General Surgery

## 2015-10-05 VITALS — BP 126/74 | HR 92 | Temp 98.9°F | Resp 16 | Wt 228.0 lb

## 2015-10-05 VITALS — BP 130/76 | HR 78 | Resp 14 | Ht 67.0 in | Wt 217.0 lb

## 2015-10-05 DIAGNOSIS — I10 Essential (primary) hypertension: Secondary | ICD-10-CM | POA: Diagnosis not present

## 2015-10-05 DIAGNOSIS — C50211 Malignant neoplasm of upper-inner quadrant of right female breast: Secondary | ICD-10-CM

## 2015-10-05 DIAGNOSIS — E7800 Pure hypercholesterolemia, unspecified: Secondary | ICD-10-CM | POA: Diagnosis not present

## 2015-10-05 DIAGNOSIS — E119 Type 2 diabetes mellitus without complications: Secondary | ICD-10-CM | POA: Diagnosis not present

## 2015-10-05 LAB — SURGICAL PATHOLOGY

## 2015-10-05 LAB — POCT GLYCOSYLATED HEMOGLOBIN (HGB A1C): HEMOGLOBIN A1C: 7.1

## 2015-10-05 NOTE — Telephone Encounter (Signed)
Message left results were good. Asked to call back.

## 2015-10-05 NOTE — Progress Notes (Signed)
Patient ID: Leah Schaefer, female   DOB: 10-Dec-1940, 75 y.o.   MRN: [id number]         Patient: Leah Schaefer Female    DOB: 03-Jun-1941   75 y.o.   MRN: [id number] Visit Date: 10/05/2015  Today's Provider: Margarita Rana, MD   Chief Complaint  Patient presents with  . Hypertension  . Hyperlipidemia  . Diabetes   Subjective:    HPI    Diabetes Mellitus Type II, Follow-up:   Lab Results  Component Value Date   HGBA1C 7.1 10/05/2015   HGBA1C 6.9 07/05/2015   HGBA1C 7.1 03/30/2015   Last seen for diabetes 3 months ago.  Management since then includes None. She reports excellent compliance with treatment. She is not having side effects.  Current symptoms include none and have been stable. Home blood sugar records: Mid 100's  Episodes of hypoglycemia? No Most Recent Eye Exam: UTD Weight trend: stable Current diet: in general, a "healthy" diet   Current exercise: Some; but not as she used to.  Will start back soon  ------------------------------------------------------------------------   Hypertension, follow-up:  BP Readings from Last 3 Encounters:  10/05/15 126/74  10/05/15 130/76  09/30/15 122/57    She was last seen for hypertension 3 months ago.  Management since that visit includes None .She reports excellent compliance with treatment. She is not having side effects.  She is not exercising. She is adherent to low salt diet.   Outside blood pressures are 130's/70's. She is experiencing none.  Patient denies chest pain, chest pressure/discomfort and lower extremity edema.   Cardiovascular risk factors include advanced age (older than 32 for men, 8 for women), diabetes mellitus and dyslipidemia.    ------------------------------------------------------------------------    Lipid/Cholesterol, Follow-up:   Last seen for this 3 months ago.  Management since that visit includes None.  Last Lipid Panel:    Component Value Date/Time   CHOL 136  04/05/2015 0857   TRIG 103 04/05/2015 0857   HDL 41 04/05/2015 0857   CHOLHDL 3.3 04/05/2015 0857   LDLCALC 74 04/05/2015 0857    She reports excellent compliance with treatment. She is not having side effects.   Wt Readings from Last 3 Encounters:  10/05/15 228 lb (103.42 kg)  10/05/15 217 lb (98.431 kg)  09/23/15 229 lb (103.874 kg)    ------------------------------------------------------------------------        No Known Allergies Previous Medications   ACCU-CHEK FASTCLIX LANCETS MISC    To check blood sugar once a day.   ACETAMINOPHEN (TYLENOL) 500 MG TABLET    Take 500 mg by mouth every 6 (six) hours as needed for pain.   ACYCLOVIR (ZOVIRAX) 200 MG CAPSULE    TAKE 1 CAPSULE EVERY DAY   ALCOHOL SWABS (B-D SINGLE USE SWABS REGULAR) PADS    To check blood sugar once a day.   AMITRIPTYLINE (ELAVIL) 10 MG TABLET    Take 10 mg by mouth at bedtime.    ASCORBIC ACID (VITAMIN C) 100 MG TABLET    Take 100 mg by mouth daily.   ASPIRIN 81 MG TABLET    Take 81 mg by mouth daily.   ATORVASTATIN (LIPITOR) 10 MG TABLET    TAKE 1 TABLET EVERY EVENING   BIMATOPROST (LUMIGAN) 0.01 % SOLN    Place 1 drop into both eyes at bedtime.    BLOOD GLUCOSE CALIBRATION (ACCU-CHEK SMARTVIEW CONTROL) LIQD    Use as directed.   BLOOD GLUCOSE MONITORING SUPPL (ACCU-CHEK NANO SMARTVIEW) W/DEVICE KIT  To check blood sugar once a day.   CALCIUM CARBONATE-VITAMIN D (CALCIUM 600 + D PO)    Take 1 capsule by mouth daily.   ESCITALOPRAM (LEXAPRO) 20 MG TABLET    TAKE 1 TABLET EVERY DAY   FISH OIL-OMEGA-3 FATTY ACIDS 1000 MG CAPSULE    Take 2 g by mouth daily.   GABAPENTIN (NEURONTIN) 300 MG CAPSULE    Take 2 capsules (600 mg total) by mouth at bedtime.   GLUCOSE BLOOD TEST STRIP    To check blood sugar once a day.   HYDROCODONE-ACETAMINOPHEN (NORCO) 5-325 MG TABLET    Take 1-2 tablets by mouth every 4 (four) hours as needed for moderate pain.   HYDROCODONE-ACETAMINOPHEN (NORCO/VICODIN) 5-325 MG TABLET     Take 1 tablet by mouth every 6 (six) hours as needed for moderate pain.   METOPROLOL TARTRATE (LOPRESSOR) 25 MG TABLET    TAKE 1/2 TABLET TWICE DAILY   MULTIPLE VITAMINS-MINERALS (CENTRUM SILVER ULTRA WOMENS PO)    Take 1 tablet by mouth daily.   NAPROXEN (NAPROSYN) 500 MG TABLET    Take by mouth. Reported on 10/05/2015   OMEPRAZOLE (PRILOSEC) 20 MG CAPSULE    TAKE 1 CAPSULE TWICE DAILY   PYRIDOXINE (B-6) 100 MG TABLET    Take 100 mg by mouth daily.   TETRAHYDROZOLINE-ZN SULFATE (EYE DROPS A/C OP)    Apply to eye.   VITAMIN B-12 (CYANOCOBALAMIN) 1000 MCG TABLET    Take 1,000 mcg by mouth daily.   VITAMIN E 400 UNIT CAPSULE    Take 400 Units by mouth daily.    Review of Systems  Constitutional: Positive for diaphoresis and fatigue. Negative for fever, chills, activity change, appetite change and unexpected weight change.  Respiratory: Negative.   Cardiovascular: Negative.   Gastrointestinal: Negative.   Endocrine: Negative for cold intolerance, heat intolerance, polydipsia, polyphagia and polyuria.  Musculoskeletal: Positive for back pain and arthralgias. Negative for myalgias, joint swelling, gait problem, neck pain and neck stiffness.       History of arthritis   Neurological: Positive for headaches. Negative for dizziness and numbness.    Social History  Substance Use Topics  . Smoking status: Never Smoker   . Smokeless tobacco: Never Used  . Alcohol Use: No   Objective:   BP 126/74 mmHg  Pulse 92  Temp(Src) 98.9 F (37.2 C) (Oral)  Resp 16  Wt 228 lb (103.42 kg)  Results for orders placed or performed in visit on 10/05/15  POCT glycosylated hemoglobin (Hb A1C)  Result Value Ref Range   Hemoglobin A1C 7.1     Physical Exam  Constitutional: She is oriented to person, place, and time. She appears well-developed and well-nourished.  Cardiovascular: Normal rate, regular rhythm and normal heart sounds.   Pulmonary/Chest: Effort normal and breath sounds normal.    Neurological: She is alert and oriented to person, place, and time.  Psychiatric: She has a normal mood and affect. Her behavior is normal. Judgment and thought content normal.      Assessment & Plan:      1. Benign essential HTN Stable, continue current medications.  2. Controlled type 2 diabetes mellitus without complication, without long-term current use of insulin (HCC) A1C stable at 7.1%.  Recheck in three months. Continue to eat healthy and exercise and recheck in 3 months.  - POCT glycosylated hemoglobin (Hb A1C)  3. Hypercholesteremia Stable, continue current medications.     Patient was seen and examined by Jerrell Belfast, MD, and  note scribed by Ashley Royalty, CMA. I have reviewed the document for accuracy and completeness and I agree with above. - Jerrell Belfast, MD   Margarita Rana, MD  [facility]

## 2015-10-05 NOTE — Progress Notes (Signed)
Patient ID: Leah Schaefer, female   DOB: 08-20-1940, 75 y.o.   MRN: [id number]  Chief Complaint  Patient presents with  . Routine Post Op    right breast lumpectomy    HPI Leah Schaefer is a 75 y.o. female here today for her post op right breast lumpectomy done on 09/30/15. Patient states she is doing well.  HPI  Past Medical History  Diagnosis Date  . Hernia 2011  . Cystitis   . 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 Personal history of malignant neoplasm of breast   . Hypertension   . GERD (gastroesophageal reflux disease)   . Diabetes mellitus without complication (HCC)     NO MEDS-DIET CONTROLLED  . 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 Chemotherapy/post wide excision whole breast radiation.  . Breast cancer ([place])   . 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    Past Surgical History  Procedure Laterality Date  . Colonoscopy  2011    Dr OCandace Cruise . Hernia repair Right 2009    inguinal, and umbilical  . Cholecystectomy  2007  . Dilation and curettage of uterus  2004  . Portacath placement  2013  . Upper gi endoscopy  2011  . Breast surgery Left 2012    wide excision  . Breast surgery Left November 2013    Core biopsy of the upper-outer quadrant showed fat necrosis.  . Colonoscopy with propofol N/A 02/25/2015    Procedure: COLONOSCOPY WITH PROPOFOL;  Surgeon: PHulen Luster MD;  [facility]  Service: Gastroenterology;  Laterality: N/A;  . Breast biopsy Left 2013  . Port-a-cath removal    . Breast lumpectomy with sentinel lymph node biopsy Right 09/30/2015    Procedure: BREAST LUMPECTOMY WITH SENTINEL LYMPH NODE BX;  Surgeon: JRobert Bellow MD;  Location: [facility] ORS;  Service: General;  Laterality: Right;    Family History  Problem Relation Age  of Onset  . Ovarian cancer Sister   . Lymphoma Father   . Colon cancer Brother   . Lymphoma Brother   . Breast cancer Neg Hx     Social History Social History  Substance Use Topics  . Smoking status: Never Smoker   . Smokeless tobacco: Never Used  . Alcohol Use: No    No Known Allergies  Current Outpatient Prescriptions  Medication Sig Dispense Refill  . ACCU-CHEK FASTCLIX LANCETS MISC To check blood sugar once a day. 102 each 3  . acetaminophen (TYLENOL) 500 MG tablet Take 500 mg by mouth every 6 (six) hours as needed for pain.    .Marland Kitchenacyclovir (ZOVIRAX) 200 MG capsule TAKE 1 CAPSULE EVERY DAY 90 capsule 3  . Alcohol Swabs (B-D SINGLE USE SWABS REGULAR) PADS To check blood sugar once a day. 100 each 3  . amitriptyline (ELAVIL) 10 MG tablet Take 10 mg by mouth at bedtime.     . Ascorbic Acid (VITAMIN C) 100 MG tablet Take 100 mg by mouth daily.    .Marland Kitchenaspirin 81 MG tablet Take 81 mg by mouth daily.    .Marland Kitchenatorvastatin (LIPITOR) 10 MG tablet TAKE 1 TABLET EVERY EVENING 90 tablet 3  . bimatoprost (LUMIGAN) 0.01 % SOLN Place 1 drop into both eyes at bedtime.     .Marland Kitchen  Blood Glucose Calibration (ACCU-CHEK SMARTVIEW CONTROL) LIQD Use as directed. 1 each 3  . Blood Glucose Monitoring Suppl (ACCU-CHEK NANO SMARTVIEW) W/DEVICE KIT To check blood sugar once a day. 1 kit 0  . Calcium Carbonate-Vitamin D (CALCIUM 600 + D PO) Take 1 capsule by mouth daily.    Marland Kitchen escitalopram (LEXAPRO) 20 MG tablet TAKE 1 TABLET EVERY DAY (Patient taking differently: TAKE 1 TABLET EVERY DAY-AM) 90 tablet 3  . fish oil-omega-3 fatty acids 1000 MG capsule Take 2 g by mouth daily.    Marland Kitchen gabapentin (NEURONTIN) 300 MG capsule Take 2 capsules (600 mg total) by mouth at bedtime. 180 capsule 3  . glucose blood test strip To check blood sugar once a day. 100 each 3  . HYDROcodone-acetaminophen (NORCO) 5-325 MG tablet Take 1-2 tablets by mouth every 4 (four) hours as needed for moderate pain. 30 tablet 0  .  HYDROcodone-acetaminophen (NORCO/VICODIN) 5-325 MG tablet Take 1 tablet by mouth every 6 (six) hours as needed for moderate pain. 60 tablet 0  . metoprolol tartrate (LOPRESSOR) 25 MG tablet TAKE 1/2 TABLET TWICE DAILY 90 tablet 3  . Multiple Vitamins-Minerals (CENTRUM SILVER ULTRA WOMENS PO) Take 1 tablet by mouth daily.    . naproxen (NAPROSYN) 500 MG tablet Take by mouth. Reported on 10/05/2015    . omeprazole (PRILOSEC) 20 MG capsule TAKE 1 CAPSULE TWICE DAILY 180 capsule 3  . pyridoxine (B-6) 100 MG tablet Take 100 mg by mouth daily.    Bethann Humble Sulfate (EYE DROPS A/C OP) Apply to eye.    . vitamin B-12 (CYANOCOBALAMIN) 1000 MCG tablet Take 1,000 mcg by mouth daily.    . vitamin E 400 UNIT capsule Take 400 Units by mouth daily.     No current facility-administered medications for this visit.    Review of Systems Review of Systems  Constitutional: Negative.   Respiratory: Negative.   Cardiovascular: Negative.     Blood pressure 130/76, pulse 78, resp. rate 14, height _0  (1.702 m), weight 217 lb (98.431 kg).  Physical Exam Physical Exam  Constitutional: She is oriented to person, place, and time. She appears well-developed and well-nourished.  Eyes: Conjunctivae are normal. No scleral icterus.  Neck: Neck supple.  Pulmonary/Chest:    Right lumpectomy site is clean and healing well.   Lymphadenopathy:    She has no cervical adenopathy.  Neurological: She is alert and oriented to person, place, and time.  Skin: Skin is warm and dry.    Data Reviewed A. RIGHT BREAST, 3:00; WIDE EXCISION:  - INVASIVE MAMMARY CARCINOMA WITH MICROPAPILLARY FEATURES AND ASSOCIATED  MICROCALCIFICATIONS (10 MM).  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  - BIOPSY SITE CHANGES, MARKER CLIP PRESENT.  - THE MARGINS OF EXCISION ARE NEGATIVE.  - SEE SUMMARY BELOW.   B. SENTINEL LYMPH NODE #1 AND 2; EXCISION:  - NO TUMOR SEEN IN  TWO LYMPH NODES (0/2).   C. SENTINEL LYMPH NODE #3; EXCISION:  - NO TUMOR SEEN IN ONE LYMPH NODE (0/1).   Assessment    Doing well status post right breast wide excision.    Plan    We will reassess next week prior to referral to medical oncology/radiation oncology.  Patient to return in one week.   PCP:  Margarita Rana  This information has been scribed by Gaspar Cola CMA.    Robert Bellow 10/06/2015, 7:42 PM

## 2015-10-05 NOTE — Patient Instructions (Signed)
Patient to return in one week. 

## 2015-10-06 ENCOUNTER — Encounter: Payer: Self-pay | Admitting: General Surgery

## 2015-10-13 ENCOUNTER — Ambulatory Visit (INDEPENDENT_AMBULATORY_CARE_PROVIDER_SITE_OTHER): Payer: Commercial Managed Care - HMO | Admitting: General Surgery

## 2015-10-13 ENCOUNTER — Encounter: Payer: Self-pay | Admitting: General Surgery

## 2015-10-13 ENCOUNTER — Other Ambulatory Visit: Payer: Self-pay

## 2015-10-13 VITALS — BP 138/84 | HR 82 | Resp 18 | Ht 69.0 in | Wt 225.0 lb

## 2015-10-13 DIAGNOSIS — C50211 Malignant neoplasm of upper-inner quadrant of right female breast: Secondary | ICD-10-CM | POA: Diagnosis not present

## 2015-10-13 NOTE — Patient Instructions (Addendum)
The patient is aware to call back for any questions or concerns.  Appointment with Dr. Baruch Gouty.

## 2015-10-13 NOTE — Progress Notes (Signed)
Patient ID: Leah Schaefer, female   DOB: 07/11/1941, 75 y.o.   MRN: [id number]  Chief Complaint  Patient presents with  . Routine Post Op    HPI Leah Schaefer is a 75 y.o. female today for her post op right breast lumpectomy done on 09/30/15. Patient states she is doing well minimal to no pain.    HPI  Past Medical History  Diagnosis Date  . Hernia 2011  . Cystitis   . 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 Personal history of malignant neoplasm of breast   . Hypertension   . GERD (gastroesophageal reflux disease)   . Diabetes mellitus without complication (HCC)     NO MEDS-DIET CONTROLLED  . 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 Chemotherapy/post wide excision whole breast radiation.  . Breast cancer ([place])   . 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    Past Surgical History  Procedure Laterality Date  . Colonoscopy  2011    Dr OCandace Cruise . Hernia repair Right 2009    inguinal, and umbilical  . Cholecystectomy  2007  . Dilation and curettage of uterus  2004  . Portacath placement  2013  . Upper gi endoscopy  2011  . Breast surgery Left 2012    wide excision  . Breast surgery Left November 2013    Core biopsy of the upper-outer quadrant showed fat necrosis.  . Colonoscopy with propofol N/A 02/25/2015    Procedure: COLONOSCOPY WITH PROPOFOL;  Surgeon: PHulen Luster MD;  [facility];  Service: Gastroenterology;  Laterality: N/A;  . Breast biopsy Left 2013  . Port-a-cath removal    . Breast lumpectomy with sentinel lymph node biopsy Right 09/30/2015    Procedure: BREAST LUMPECTOMY WITH SENTINEL LYMPH NODE BX;  Surgeon: JRobert Bellow MD;  Location: [facility] ORS;  Service: General;  Laterality: Right;    Family History  Problem Relation Age of Onset   . Ovarian cancer Sister   . Lymphoma Father   . Colon cancer Brother   . Lymphoma Brother   . Breast cancer Neg Hx     Social History Social History  Substance Use Topics  . Smoking status: Never Smoker   . Smokeless tobacco: Never Used  . Alcohol Use: No    No Known Allergies  Current Outpatient Prescriptions  Medication Sig Dispense Refill  . ACCU-CHEK FASTCLIX LANCETS MISC To check blood sugar once a day. 102 each 3  . acetaminophen (TYLENOL) 500 MG tablet Take 500 mg by mouth every 6 (six) hours as needed for pain.    .Marland Kitchenacyclovir (ZOVIRAX) 200 MG capsule TAKE 1 CAPSULE EVERY DAY 90 capsule 3  . Alcohol Swabs (B-D SINGLE USE SWABS REGULAR) PADS To check blood sugar once a day. 100 each 3  . amitriptyline (ELAVIL) 10 MG tablet Take 10 mg by mouth at bedtime.     . Ascorbic Acid (VITAMIN C) 100 MG tablet Take 100 mg by mouth daily.    .Marland Kitchenaspirin 81 MG tablet Take 81 mg by mouth daily.    .Marland Kitchenatorvastatin (LIPITOR) 10 MG tablet TAKE 1 TABLET EVERY EVENING 90 tablet 3  . bimatoprost (LUMIGAN) 0.01 % SOLN Place 1 drop into both eyes at bedtime.     .Marland Kitchen  Blood Glucose Calibration (ACCU-CHEK SMARTVIEW CONTROL) LIQD Use as directed. 1 each 3  . Blood Glucose Monitoring Suppl (ACCU-CHEK NANO SMARTVIEW) W/DEVICE KIT To check blood sugar once a day. 1 kit 0  . Calcium Carbonate-Vitamin D (CALCIUM 600 + D PO) Take 1 capsule by mouth daily.    Marland Kitchen escitalopram (LEXAPRO) 20 MG tablet TAKE 1 TABLET EVERY DAY (Patient taking differently: TAKE 1 TABLET EVERY DAY-AM) 90 tablet 3  . fish oil-omega-3 fatty acids 1000 MG capsule Take 2 g by mouth daily.    Marland Kitchen gabapentin (NEURONTIN) 300 MG capsule Take 2 capsules (600 mg total) by mouth at bedtime. 180 capsule 3  . glucose blood test strip To check blood sugar once a day. 100 each 3  . metoprolol tartrate (LOPRESSOR) 25 MG tablet TAKE 1/2 TABLET TWICE DAILY 90 tablet 3  . Multiple Vitamins-Minerals (CENTRUM SILVER ULTRA WOMENS PO) Take 1 tablet by mouth  daily.    Marland Kitchen omeprazole (PRILOSEC) 20 MG capsule TAKE 1 CAPSULE TWICE DAILY 180 capsule 3  . pyridoxine (B-6) 100 MG tablet Take 100 mg by mouth daily.    Bethann Humble Sulfate (EYE DROPS A/C OP) Apply to eye.    . vitamin B-12 (CYANOCOBALAMIN) 1000 MCG tablet Take 1,000 mcg by mouth daily.    . vitamin E 400 UNIT capsule Take 400 Units by mouth daily.     No current facility-administered medications for this visit.    Review of Systems Review of Systems  Constitutional: Negative.   Respiratory: Negative.   Cardiovascular: Negative.     Blood pressure 138/84, pulse 82, resp. rate 18, height '5\' 9"'  (1.753 m), weight 225 lb (102.059 kg).  Physical Exam Physical Exam  Constitutional: She is oriented to person, place, and time. She appears well-developed and well-nourished.  Eyes: Conjunctivae are normal. No scleral icterus.  Neck: Neck supple.  Cardiovascular: Normal rate, regular rhythm and normal heart sounds.   Pulmonary/Chest: Effort normal and breath sounds normal.    Lymphadenopathy:    She has no cervical adenopathy.  Neurological: She is alert and oriented to person, place, and time.  Skin: Skin is warm and dry.    Data Reviewed Ultrasound examination of the right breast was completed to determine if the patient was a candidate for accelerated partial breast radiation. There is a adequate cavity measuring 1.9 x 3.8 x 6.6 cm. Minimal distance of the skin is 1.66 cm.  Assessment    Doing well status post wide excision and sentinel node biopsy.    Plan    We'll make arrangements for evaluation by radiation oncology.    Follow up 3 weeks.  Appointment with Dr. Massie Maroon.  PCP:  Margarita Rana This information has been scribed by Karie Fetch Fowler.  Marland Kitchen   Robert Bellow 10/13/2015, 9:29 PM

## 2015-10-18 ENCOUNTER — Ambulatory Visit
Admission: RE | Admit: 2015-10-18 | Discharge: 2015-10-18 | Disposition: A | Discharge status: Home / Self Care | Payer: Commercial Managed Care - HMO | Source: Ambulatory Visit | Attending: Radiation Oncology | Admitting: Radiation Oncology

## 2015-10-18 ENCOUNTER — Encounter: Payer: Self-pay | Admitting: Radiation Oncology

## 2015-10-18 VITALS — BP 124/80 | HR 82 | Temp 95.9°F | Resp 18 | Wt 224.5 lb

## 2015-10-18 DIAGNOSIS — Z510 Encounter for antineoplastic radiation therapy: Secondary | ICD-10-CM | POA: Insufficient documentation

## 2015-10-18 DIAGNOSIS — K219 Gastro-esophageal reflux disease without esophagitis: Secondary | ICD-10-CM | POA: Insufficient documentation

## 2015-10-18 DIAGNOSIS — Z170 Estrogen receptor positive status [ER+]: Secondary | ICD-10-CM | POA: Insufficient documentation

## 2015-10-18 DIAGNOSIS — E119 Type 2 diabetes mellitus without complications: Secondary | ICD-10-CM | POA: Insufficient documentation

## 2015-10-18 DIAGNOSIS — Z807 Family history of other malignant neoplasms of lymphoid, hematopoietic and related tissues: Secondary | ICD-10-CM | POA: Insufficient documentation

## 2015-10-18 DIAGNOSIS — C50212 Malignant neoplasm of upper-inner quadrant of left female breast: Secondary | ICD-10-CM | POA: Insufficient documentation

## 2015-10-18 DIAGNOSIS — C50411 Malignant neoplasm of upper-outer quadrant of right female breast: Secondary | ICD-10-CM | POA: Diagnosis not present

## 2015-10-18 DIAGNOSIS — C50211 Malignant neoplasm of upper-inner quadrant of right female breast: Secondary | ICD-10-CM

## 2015-10-18 DIAGNOSIS — Z8041 Family history of malignant neoplasm of ovary: Secondary | ICD-10-CM | POA: Insufficient documentation

## 2015-10-18 DIAGNOSIS — Z800 Family history of malignant neoplasm of digestive organs: Secondary | ICD-10-CM | POA: Insufficient documentation

## 2015-10-18 DIAGNOSIS — I10 Essential (primary) hypertension: Secondary | ICD-10-CM | POA: Insufficient documentation

## 2015-10-18 DIAGNOSIS — C50412 Malignant neoplasm of upper-outer quadrant of left female breast: Secondary | ICD-10-CM | POA: Diagnosis not present

## 2015-10-18 DIAGNOSIS — Z7982 Long term (current) use of aspirin: Secondary | ICD-10-CM | POA: Insufficient documentation

## 2015-10-18 DIAGNOSIS — Z79899 Other long term (current) drug therapy: Secondary | ICD-10-CM | POA: Insufficient documentation

## 2015-10-18 DIAGNOSIS — N3090 Cystitis, unspecified without hematuria: Secondary | ICD-10-CM | POA: Insufficient documentation

## 2015-10-18 NOTE — Consult Note (Signed)
Except an outstanding is perfect of Radiation Oncology NEW PATIENT EVALUATION  Name: Leah Schaefer  MRN: [id number]  Date:   10/18/2015     DOB: 1940-12-19   This 75 y.o. female patient presents to the clinic for initial evaluation of stage I invasive mammary carcinoma (T1b N0 M0). ER/PR positive HER-2/neu negative invasive mammary carcinoma of the right breast inpatient prior treated to her left breast with whole breast radiation in 2013  REFERRING PHYSICIAN: Margarita Rana, MD  CHIEF COMPLAINT:  Chief Complaint  Patient presents with  . Breast Cancer    Pt is here for initial consultation of breast cancer.  She was treated previously for left breast cancer.      DIAGNOSIS: The primary encounter diagnosis was Malignant neoplasm of upper-inner quadrant of right female breast ([place]). A diagnosis of Malignant neoplasm of upper-outer quadrant of left female breast [facility]) was also pertinent to this visit.   PREVIOUS INVESTIGATIONS:  Mammograms ultrasound reviewed Clinical notes reviewed Prior radiation fields reviewed Surgical pathology reports reviewed  HPI: Patient is a 75 year old female well known to our department having completed radiation therapy to her left breast for triple negative stage IIa breast cancer back in 2013. She has done well although recently presented with an abnormal mammogram of the right breast showing a mass at the 2:00 position 2 cm from the nipple suspicious for malignancy. She underwent ultrasound-guided biopsy which was positive for invasive mammary carcinoma ER/PR positive HER-2/neu not overexpressed. She underwent wide local excision and sentinel node biopsy. Tumor was less than 1 cm from her original mammogram and ultrasound. No residual tumor was seen at the time of wide local excision. Ductal carcinoma in situ was present margins were clear at 4 mm. 3 sentinel lymph nodes were negative for metastatic disease. Patient has done well postoperatively. Her cavity  is being evaluated for possible MammoSite balloon catheter placement. She specifically denies breast tenderness cough or bone pain. Mammograms of the left breast over time have been fine with no evidence of recurrent disease in the left breast.   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    PAST SURGICAL HISTORY:  Past Surgical History  Procedure Laterality Date  . Colonoscopy  2011    Dr Candace Cruise  . Hernia repair Right 2009    inguinal, and umbilical  . Cholecystectomy  2007  . Dilation and curettage of uterus  2004  . Portacath placement  2013  . Upper gi endoscopy  2011  . Breast surgery Left 2012    wide excision  . Breast surgery Left November 2013    Core biopsy of the upper-outer quadrant showed fat necrosis.  . Colonoscopy with propofol N/A 02/25/2015    Procedure: COLONOSCOPY WITH PROPOFOL;  Surgeon: Hulen Luster, MD;  Location: [facility] ENDOSCOPY;  Service: Gastroenterology;  Laterality: N/A;  . Breast biopsy Left 2013  . Port-a-cath removal    . Breast lumpectomy with sentinel lymph node biopsy Right 09/30/2015    Procedure: BREAST LUMPECTOMY WITH SENTINEL LYMPH NODE BX;  Surgeon: Robert Bellow, MD;  Location: [facility]  ORS;  Service: General;  Laterality: Right;    FAMILY HISTORY: family history includes Colon cancer in her brother; Lymphoma in her brother and father; Ovarian cancer in her sister. There is no history of Breast cancer.  SOCIAL HISTORY:  reports  that she has never smoked. She has never used smokeless tobacco. She reports that she does not drink alcohol or use illicit drugs.  ALLERGIES: Review of patient's allergies indicates no known allergies.  MEDICATIONS:  Current Outpatient Prescriptions  Medication Sig Dispense Refill  . ACCU-CHEK FASTCLIX LANCETS MISC To check blood sugar once a day. 102 each 3  . acetaminophen (TYLENOL) 500 MG tablet Take 500 mg by mouth every 6 (six) hours as needed for pain.    Marland Kitchen acyclovir (ZOVIRAX) 200 MG capsule TAKE 1 CAPSULE EVERY DAY 90 capsule 3  . Alcohol Swabs (B-D SINGLE USE SWABS REGULAR) PADS To check blood sugar once a day. 100 each 3  . Ascorbic Acid (VITAMIN C) 100 MG tablet Take 100 mg by mouth daily.    Marland Kitchen aspirin 81 MG tablet Take 81 mg by mouth daily.    Marland Kitchen atorvastatin (LIPITOR) 10 MG tablet TAKE 1 TABLET EVERY EVENING 90 tablet 3  . bimatoprost (LUMIGAN) 0.01 % SOLN Place 1 drop into both eyes at bedtime.     . Blood Glucose Calibration (ACCU-CHEK SMARTVIEW CONTROL) LIQD Use as directed. 1 each 3  . Blood Glucose Monitoring Suppl (ACCU-CHEK NANO SMARTVIEW) W/DEVICE KIT To check blood sugar once a day. 1 kit 0  . Calcium Carbonate-Vitamin D (CALCIUM 600 + D PO) Take 1 capsule by mouth daily.    Marland Kitchen escitalopram (LEXAPRO) 20 MG tablet TAKE 1 TABLET EVERY DAY (Patient taking differently: TAKE 1 TABLET EVERY DAY-AM) 90 tablet 3  . fish oil-omega-3 fatty acids 1000 MG capsule Take 2 g by mouth daily.    Marland Kitchen gabapentin (NEURONTIN) 300 MG capsule Take 2 capsules (600 mg total) by mouth at bedtime. 180 capsule 3  . glucose blood test strip To check blood sugar once a day. 100 each 3  . HYDROcodone-acetaminophen (NORCO/VICODIN) 5-325 MG tablet     . metoprolol tartrate (LOPRESSOR) 25 MG tablet TAKE 1/2 TABLET TWICE DAILY 90 tablet 3  . Multiple Vitamins-Minerals (CENTRUM SILVER ULTRA WOMENS PO) Take 1 tablet by mouth daily.    Marland Kitchen omeprazole (PRILOSEC) 20 MG capsule TAKE 1 CAPSULE TWICE DAILY 180 capsule  3  . pyridoxine (B-6) 100 MG tablet Take 100 mg by mouth daily.    Bethann Humble Sulfate (EYE DROPS A/C OP) Apply to eye.    . vitamin B-12 (CYANOCOBALAMIN) 1000 MCG tablet Take 1,000 mcg by mouth daily.    . vitamin E 400 UNIT capsule Take 400 Units by mouth daily.    Marland Kitchen amitriptyline (ELAVIL) 10 MG tablet Take 10 mg by mouth at bedtime. Reported on 10/18/2015     No current facility-administered medications for this encounter.    ECOG PERFORMANCE STATUS:  0 - Asymptomatic  REVIEW OF SYSTEMS:  Patient denies any weight loss, fatigue, weakness, fever, chills or night sweats. Patient denies any loss of vision, blurred vision. Patient denies any ringing  of the ears or hearing loss. No irregular heartbeat. Patient denies heart murmur or history of fainting. Patient denies any chest pain or pain radiating to her upper extremities. Patient denies any shortness of breath, difficulty breathing at night, cough or hemoptysis. Patient denies any swelling in the lower legs. Patient denies any nausea vomiting, vomiting of blood, or  coffee ground material in the vomitus. Patient denies any stomach pain. Patient states has had normal bowel movements no significant constipation or diarrhea. Patient denies any dysuria, hematuria or significant nocturia. Patient denies any problems walking, swelling in the joints or loss of balance. Patient denies any skin changes, loss of hair or loss of weight. Patient denies any excessive worrying or anxiety or significant depression. Patient denies any problems with insomnia. Patient denies excessive thirst, polyuria, polydipsia. Patient denies any swollen glands, patient denies easy bruising or easy bleeding. Patient denies any recent infections, allergies or URI. Patient "s visual fields have not changed significantly in recent time.    PHYSICAL EXAM: BP 124/80 mmHg  Pulse 82  Temp(Src) 95.9 F (35.5 C)  Resp 18  Wt 224 lb 8.6 oz (101.85 kg) Well-developed female  in NAD. Left breast shows sequelae of prior whole breast radiation with no evidence of mass or nodularity in 2 positions examined. She status post recent wide local excision and sentinel node biopsy the right breast breast is healed well. Again no dominant mass or nodularity is noted in 2 positions examined. No axillary or supraclavicular adenopathy is appreciated. Well-developed well-nourished patient in NAD. HEENT reveals PERLA, EOMI, discs not visualized.  Oral cavity is clear. No oral mucosal lesions are identified. Neck is clear without evidence of cervical or supraclavicular adenopathy. Lungs are clear to A&P. Cardiac examination is essentially unremarkable with regular rate and rhythm without murmur rub or thrill. Abdomen is benign with no organomegaly or masses noted. Motor sensory and DTR levels are equal and symmetric in the upper and lower extremities. Cranial nerves II through XII are grossly intact. Proprioception is intact. No peripheral adenopathy or edema is identified. No motor or sensory levels are noted. Crude visual fields are within normal range.  LABORATORY DATA: Pathology reports reviewed    RADIOLOGY RESULTS: Mammograms and ultrasound reviewed   IMPRESSION: Stage I (T1 BN 0 M0) invasive mammary carcinoma of the right breast as was wide local excision and sentinel node biopsy ER/PR positive HER-2/neu not overexpressed.  PLAN: Had a long discussion today about treatment options of her right breast. I recommend adjuvant radiation therapy. She has had prior whole breast radiation in the past and I have gone over again whole breast recognition as well as accelerated partial breast radiation. Believe she would be a good candidate for AP BI. Risks and benefits of treatment including insertion of balloon catheter followed by high-dose rate remote afterloading was discussed with the patient side effects including possible skin reaction fatigue permanent thickening of the lumpectomy site all  were discussed in detail with the patient. We'll arrange with surgeon to place MammoSite catheter balloon and plan her BrachyVision treatment planning. Patient also will be a candidate for tamoxifen or aromatase inhibitor therapy after completion of radiation.  I would like to take this opportunity for allowing me to participate in the care of your patient.Armstead Peaks., MD

## 2015-10-19 ENCOUNTER — Other Ambulatory Visit: Payer: Self-pay | Admitting: Family Medicine

## 2015-10-19 DIAGNOSIS — E119 Type 2 diabetes mellitus without complications: Secondary | ICD-10-CM

## 2015-10-19 NOTE — Telephone Encounter (Signed)
LOV 10/05/2015. Renaldo Fiddler, CMA

## 2015-10-20 ENCOUNTER — Telehealth: Payer: Self-pay | Admitting: *Deleted

## 2015-10-20 MED ORDER — CEFADROXIL 500 MG PO CAPS: 500.0000 mg | ORAL_CAPSULE | Freq: Two times a day (BID) | ORAL | Status: DC

## 2015-10-20 NOTE — Telephone Encounter (Signed)
Mammosite schedule reviewed with the patient Placement  11-01-15 1:15     at [facility] Scan 11-04-15 Treat 11-08-15 to 11-12-15 Aware the [facility] will be calling her for more details Aware of ATB and directions reviewed. Aware no showers and to wear her bra while mammosite in place. Pt agrees.

## 2015-10-20 NOTE — Telephone Encounter (Signed)
Spoke with patient regarding appointments for her Mammosite Treatments;  November 04, 2015 at 10:00am; April 3 thru April 6 830am and 230pm each day.  I explained to her that she would get the times for Friday April 7 at her next appointment.  She verbalized understanding of all information given, all questions answered to her satisfaction.

## 2015-10-27 ENCOUNTER — Emergency Department: Payer: Commercial Managed Care - HMO

## 2015-10-27 ENCOUNTER — Other Ambulatory Visit: Payer: Self-pay

## 2015-10-27 ENCOUNTER — Telehealth: Payer: Self-pay | Admitting: Family Medicine

## 2015-10-27 ENCOUNTER — Emergency Department
Admission: EM | Admit: 2015-10-27 | Discharge: 2015-10-27 | Disposition: A | Discharge status: Home / Self Care | Payer: Commercial Managed Care - HMO | Attending: Emergency Medicine | Admitting: Emergency Medicine

## 2015-10-27 ENCOUNTER — Encounter: Payer: Self-pay | Admitting: Emergency Medicine

## 2015-10-27 DIAGNOSIS — Z79899 Other long term (current) drug therapy: Secondary | ICD-10-CM | POA: Diagnosis not present

## 2015-10-27 DIAGNOSIS — R42 Dizziness and giddiness: Secondary | ICD-10-CM | POA: Insufficient documentation

## 2015-10-27 DIAGNOSIS — Z7982 Long term (current) use of aspirin: Secondary | ICD-10-CM | POA: Diagnosis not present

## 2015-10-27 DIAGNOSIS — I10 Essential (primary) hypertension: Secondary | ICD-10-CM | POA: Insufficient documentation

## 2015-10-27 DIAGNOSIS — E119 Type 2 diabetes mellitus without complications: Secondary | ICD-10-CM | POA: Insufficient documentation

## 2015-10-27 DIAGNOSIS — R51 Headache: Secondary | ICD-10-CM | POA: Diagnosis not present

## 2015-10-27 LAB — COMPREHENSIVE METABOLIC PANEL
ALBUMIN: 3.8 g/dL (ref 3.5–5.0)
ALT: 22 U/L (ref 14–54)
AST: 26 U/L (ref 15–41)
Alkaline Phosphatase: 85 U/L (ref 38–126)
Anion gap: 5 (ref 5–15)
BUN: 9 mg/dL (ref 6–20)
CHLORIDE: 105 mmol/L (ref 101–111)
CO2: 28 mmol/L (ref 22–32)
CREATININE: 0.75 mg/dL (ref 0.44–1.00)
Calcium: 9.2 mg/dL (ref 8.9–10.3)
GFR calc non Af Amer: >60 mL/min (ref >60)
GLUCOSE: 140 mg/dL — AB (ref 65–99)
Potassium: 3.9 mmol/L (ref 3.5–5.1)
SODIUM: 138 mmol/L (ref 135–145)
Total Bilirubin: 0.6 mg/dL (ref 0.3–1.2)
Total Protein: 7 g/dL (ref 6.5–8.1)

## 2015-10-27 LAB — CBC
HEMATOCRIT: 43.2 % (ref 35.0–47.0)
HEMOGLOBIN: 15.1 g/dL (ref 12.0–16.0)
MCH: 31.1 pg (ref 26.0–34.0)
MCHC: 34.9 g/dL (ref 32.0–36.0)
MCV: 89.1 fL (ref 80.0–100.0)
Platelets: 195 10*3/uL (ref 150–440)
RBC: 4.85 MIL/uL (ref 3.80–5.20)
RDW: 12.7 % (ref 11.5–14.5)
WBC: 5 10*3/uL (ref 3.6–11.0)

## 2015-10-27 LAB — URINALYSIS COMPLETE WITH MICROSCOPIC (ARMC ONLY)
Bilirubin Urine: NEGATIVE
GLUCOSE, UA: NEGATIVE mg/dL
Hgb urine dipstick: NEGATIVE
Ketones, ur: NEGATIVE mg/dL
Leukocytes, UA: NEGATIVE
Nitrite: NEGATIVE
PROTEIN: NEGATIVE mg/dL
SPECIFIC GRAVITY, URINE: 1.019 (ref 1.005–1.030)
pH: 6 (ref 5.0–8.0)

## 2015-10-27 LAB — TROPONIN I: Troponin I: <0.03 ng/mL (ref <0.031)

## 2015-10-27 LAB — GLUCOSE, CAPILLARY: GLUCOSE-CAPILLARY: 142 mg/dL — AB (ref 65–99)

## 2015-10-27 MED ORDER — MECLIZINE HCL 25 MG PO TABS: 12.5000 mg | ORAL_TABLET | Freq: Three times a day (TID) | ORAL | Status: DC | PRN

## 2015-10-27 MED ORDER — IOHEXOL 300 MG/ML  SOLN
75.0000 mL | Freq: Once | INTRAMUSCULAR | Status: AC | PRN
  Administered 2015-10-27: 75 mL via INTRAVENOUS
  Filled 2015-10-27: qty 75

## 2015-10-27 MED ORDER — SODIUM CHLORIDE 0.9 % IV BOLUS (SEPSIS)
500.0000 mL | INTRAVENOUS | Status: AC
  Administered 2015-10-27: 500 mL via INTRAVENOUS

## 2015-10-27 NOTE — ED Notes (Signed)
Patient continues to rest well. She is tolerating IV fluids without discomfort. States "she is hungry and ready to go home now." MD notified. Will discharge as soon as fluids are in.

## 2015-10-27 NOTE — ED Notes (Signed)
To ED via POV from home with family member for complaint of dizziness and off balance x 3 days. States she did have some blurry vision this morning but after drinking some cold water that went away immediately. States she had a headache in the top of her head today too but that is also gone. Denies difficulty with speech or forming words. Denies cough, CP or respiratory difficulty. Denies abdominal pain or dysuria, urgency or frequency. States chronic back pain. Today, patient with equal grip strength bilaterally and states sometimes she uses her husbands cane to get around but it's not always necessary. Denies inner ear trouble. States today when the dizziness started she started to sweat and was clammy. States her blood pressure was up at 170/104. Currently patient is able to answer questions without difficulty, no facial asymmetry, no diaphoresis noted and patient is able to state time/date/name/place accurately.

## 2015-10-27 NOTE — ED Provider Notes (Signed)
[facility] Emergency Department Provider Note  ____________________________________________  Time seen: Approximately 7:26 PM  I have reviewed the triage vital signs and the nursing notes.   HISTORY  Chief Complaint Dizziness and Extremity Weakness    HPI Leah Schaefer is a 75 y.o. female who is a history of prior breast cancer which has recurred and she is getting ready to start radiation treatment who presents with dizziness and being off balance for about 3 days.  She is also had some vision disturbances but they resolved.  She also has a mild headache in the top of her head that has been intermittent for a couple of days.  She has not had any difficulty with word finding or speech as well as no difficulty with recognizing others are saying to her.  She has not had any specific weakness or numbness/tingling in any extremities.  She does feel quite unstable when she ambulates.  She denies nausea, vomiting, chest pain, shortness of breath, diarrhea, constipation.  She has not had similar symptoms in the past.Discussed the symptoms as moderate and nothing is making them better.   Past Medical History  Diagnosis Date  . Hernia 2011  . Cystitis   . 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 Personal history of malignant neoplasm of breast   . Hypertension   . GERD (gastroesophageal reflux disease)   . Diabetes mellitus without complication (HCC)     NO MEDS-DIET CONTROLLED  . 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 Chemotherapy/post wide excision whole breast radiation.  . Breast cancer ([place])   . 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    Patient Active Problem List   Diagnosis Date Noted  . Controlled type 2 diabetes mellitus without  complication ([place] 013/24/4010 . Carcinoma of breast ([place] 09/28/2015  . Breast cancer of upper-inner quadrant of right female breast ([place] 09/23/2015  . Breast mass, right 09/21/2015  . Allergic rhinitis 03/30/2015  . Anxiety 03/30/2015  . Breast CA ([place] 03/30/2015  . Carotid arterial disease ([place] 03/30/2015  . Diabetes mellitus, type 2 ([place] 03/30/2015  . Essential (primary) hypertension 03/30/2015  . Hypercholesteremia 03/30/2015  . Left leg pain 03/30/2015  . Adenopathy 03/30/2015  . LBP (low back pain) 03/30/2015  . Neuropathic pain 03/30/2015  . Burning or prickling sensation 03/30/2015  . Neuralgia neuritis, sciatic nerve 03/30/2015  . Skin cyst 11/24/2014  . Breast cancer of upper-outer quadrant of left female breast ([place] 08/17/2011  . Allergic reaction 11/29/2009  . Adaptation reaction 04/30/2009  . Acid reflux 04/30/2009  . Stricture and stenosis of cervix 01/28/2009    Past Surgical History  Procedure Laterality Date  . Colonoscopy  2011    Dr OCandace Cruise . Hernia repair Right 2009    inguinal, and umbilical  . Cholecystectomy  2007  . Dilation and curettage of uterus  2004  . Portacath placement  2013  . Upper gi endoscopy  2011  . Breast surgery Left 2012    wide excision  . Breast surgery Left November 2013    Core biopsy of the upper-outer quadrant showed fat necrosis.  . Colonoscopy with propofol N/A 02/25/2015    Procedure: COLONOSCOPY WITH PROPOFOL;  Surgeon: PHulen Luster MD;  Location: [facility] ENDOSCOPY;  Service: Gastroenterology;  Laterality: N/A;  . Breast biopsy Left 2013  . Port-a-cath removal    . Breast lumpectomy with sentinel lymph node biopsy Right 09/30/2015    Procedure: BREAST LUMPECTOMY WITH SENTINEL LYMPH NODE BX;  Surgeon: Robert Bellow, MD;  Location: [facility] ORS;  Service: General;  Laterality: Right;    Current Outpatient Rx  Name  Route  Sig  Dispense  Refill  . acetaminophen (TYLENOL) 500 MG tablet   Oral   Take 500 mg by mouth every 6  (six) hours as needed for mild pain or headache.          Marland Kitchen acyclovir (ZOVIRAX) 200 MG capsule   Oral   Take 200 mg by mouth daily.         Marland Kitchen aspirin EC 81 MG tablet   Oral   Take 81 mg by mouth daily.         Marland Kitchen atorvastatin (LIPITOR) 10 MG tablet   Oral   Take 10 mg by mouth at bedtime.         . bimatoprost (LUMIGAN) 0.01 % SOLN   Both Eyes   Place 1 drop into both eyes at bedtime.          . Calcium Carb-Cholecalciferol (CALCIUM 600+D3) 600-800 MG-UNIT TABS   Oral   Take 1 tablet by mouth 2 (two) times daily.         . cholecalciferol (VITAMIN D) 1000 units tablet   Oral   Take 1,000 Units by mouth daily.         . cyanocobalamin 500 MCG tablet   Oral   Take 500 mcg by mouth daily.         Marland Kitchen escitalopram (LEXAPRO) 20 MG tablet   Oral   Take 20 mg by mouth daily.         Marland Kitchen gabapentin (NEURONTIN) 300 MG capsule   Oral   Take 2 capsules (600 mg total) by mouth at bedtime.   180 capsule   3   . HYDROcodone-acetaminophen (NORCO/VICODIN) 5-325 MG tablet   Oral   Take 1 tablet by mouth every 6 (six) hours as needed for moderate pain.          . metoprolol tartrate (LOPRESSOR) 25 MG tablet   Oral   Take 12.5 mg by mouth 2 (two) times daily.         . Multiple Vitamin (MULTIVITAMIN WITH MINERALS) TABS tablet   Oral   Take 1 tablet by mouth daily.         Marland Kitchen omega-3 acid ethyl esters (LOVAZA) 1 g capsule   Oral   Take 3 g by mouth daily.          Marland Kitchen omeprazole (PRILOSEC) 20 MG capsule   Oral   Take 20 mg by mouth 2 (two) times daily.         Marland Kitchen pyridoxine (B-6) 100 MG tablet   Oral   Take 100 mg by mouth daily.         . vitamin C (ASCORBIC ACID) 500 MG tablet   Oral   Take 1,000 mg by mouth daily.         . vitamin E 400 UNIT capsule   Oral   Take 400 Units by mouth daily.         . cefadroxil (DURICEF) 500 MG capsule   Oral   Take 1 capsule (500 mg total) by mouth 2 (two) times daily. Start one hour before  office  procedure on 11-01-15   24 capsule   0   . meclizine (ANTIVERT) 25 MG tablet   Oral   Take 0.5 tablets (12.5 mg total) by mouth 3 (three) times daily as needed for dizziness.   15 tablet   0     Allergies Review of patient's allergies indicates no known allergies.  Family History  Problem Relation Age of Onset  . Ovarian cancer Sister   . Lymphoma Father   . Colon cancer Brother   . Lymphoma Brother   . Breast cancer Neg Hx     Social History Social History  Substance Use Topics  . Smoking status: Never Smoker   . Smokeless tobacco: Never Used  . Alcohol Use: No    Review of Systems Constitutional: No fever/chills Eyes: Visual disturbances which have since resolved. ENT: No sore throat. Cardiovascular: Denies chest pain. Respiratory: Denies shortness of breath. Gastrointestinal: No abdominal pain.  No nausea, no vomiting.  No diarrhea.  No constipation. Genitourinary: Negative for dysuria. Musculoskeletal: Negative for back pain. Skin: Negative for rash. Neurological: 3 days of persistent if not worsening imbalance and dizziness  10-point ROS otherwise negative.  ____________________________________________   PHYSICAL EXAM:  VITAL SIGNS: ED Triage Vitals  Enc Vitals Group     BP 10/27/15 1755 142/86 mmHg     Pulse Rate 10/27/15 1755 70     Resp 10/27/15 1755 18     Temp 10/27/15 1755 98.5 F (36.9 C)     Temp Source 10/27/15 1755 Oral     SpO2 10/27/15 1755 95 %     Weight 10/27/15 1755 216 lb (97.977 kg)     Height 10/27/15 1755 _0  (1.702 m)     Head Cir --      Peak Flow --      Pain Score 10/27/15 1756 0     Pain Loc --      Pain Edu? --      Excl. in [place]? --     Constitutional: Alert and oriented. Well appearing and in no acute distress. Eyes: Conjunctivae are normal. PERRL. EOMI. Ears:  Mild cerumen, no obvious infection, effusion, or erythema Head: Atraumatic. Nose: No congestion/rhinnorhea. Mouth/Throat: Mucous membranes are moist.   Oropharynx non-erythematous. Neck: No stridor.  No meningeal signs.   Cardiovascular: Normal rate, regular rhythm. Good peripheral circulation. Grossly normal heart sounds.   Respiratory: Normal respiratory effort.  No retractions. Lungs CTAB. Gastrointestinal: Soft and nontender. No distention.  Musculoskeletal: No lower extremity tenderness nor edema. No gross deformities of extremities. Neurologic:  Normal speech and language. No gross focal neurologic deficits are appreciated; normal weakness in extremities, good grip strength.  Fast beating horizontal nystagmus most pronounced when looking to the right. No vertical nystagmus.  No gait instability - ambulated around department without requiring assistance. Skin:  Skin is warm, dry and intact. No rash noted. Psychiatric: Mood and affect are normal. Speech and behavior are normal.  ____________________________________________   LABS (all labs ordered are listed, but only abnormal results are displayed)  Labs Reviewed  URINALYSIS COMPLETEWITH MICROSCOPIC ([facility] ONLY) - Abnormal; Notable for the following:    Color, Urine STRAW (*)    APPearance CLEAR (*)    Bacteria, UA RARE (*)    Squamous Epithelial / LPF 0-5 (*)    All other components within normal limits  COMPREHENSIVE METABOLIC PANEL - Abnormal; Notable for the following:    Glucose, Bld 140 (*)    All other components within  normal limits  GLUCOSE, CAPILLARY - Abnormal; Notable for the following:    Glucose-Capillary 142 (*)    All other components within normal limits  CBC  TROPONIN I  CBG MONITORING, ED   ____________________________________________  EKG  ED ECG REPORT I, Damica Gravlin, the attending physician, personally viewed and interpreted this ECG.  Date: 10/27/2015 EKG Time: 17:58 Rate: 68 Rhythm: normal sinus rhythm QRS Axis: normal Intervals: normal ST/T Wave abnormalities: normal Conduction Disturbances: none Narrative Interpretation:  unremarkable  ____________________________________________  RADIOLOGY   Ct Head W Wo Contrast  10/27/2015  CLINICAL DATA:  Intermittent dizzy spells over the last 3 days. Associated diaphoresis. EXAM: CT HEAD WITHOUT AND WITH CONTRAST TECHNIQUE: Contiguous axial images were obtained from the base of the skull through the vertex without and with intravenous contrast CONTRAST:  39m OMNIPAQUE IOHEXOL 300 MG/ML  SOLN COMPARISON:  None. FINDINGS: Mild periventricular white matter hypoattenuation is evident bilaterally. A remote lacunar infarct is present within the anterior limb of the right internal capsule. Basal ganglia calcifications are noted. No acute cortical infarct, hemorrhage, or mass lesion is present. The ventricles are of normal size. No significant extra-axial fluid collection is present. The postcontrast images demonstrate no pathologic enhancement. The paranasal sinuses and mastoid air cells are clear. The globes and orbits are intact. The extracranial soft tissues are within normal limits. IMPRESSION: 1. Mild periventricular white matter hypoattenuation bilaterally compatible with chronic microvascular ischemic change. 2. 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 3. No acute intracranial abnormality. 4. No pathologic enhancement. Electronically Signed   By: CSan MorelleM.D.   On: 10/27/2015 20:20    ____________________________________________   PROCEDURES  Procedure(s) performed: None  Critical Care performed: No ____________________________________________   INITIAL IMPRESSION / ASSESSMENT AND PLAN / ED COURSE  Pertinent labs & imaging results that were available during my care of the patient were reviewed by me and considered in my medical decision making (see chart for details).  Concerned for possible mets in brain.  No other obvious lab abnormalities.  I called and spoke with radiology who agreed that a head CT with and  without IV contrast would provide the best results and he did not feel there is any indication for CT angiography of the head at this time.  ----------------------------------------- 9:01 PM on 10/27/2015 -----------------------------------------  CT scan reassuring except for chronic microvascular disease.  The patient ambulated without any difficulty or without requiring any assistance.  I spoke by phone with her oncologist and [facility] and her oncologist.  I will discharge her for outpatient follow-up.  The patient and her family are comfortable with this plan. ____________________________________________  FINAL CLINICAL IMPRESSION(S) / ED DIAGNOSES  Final diagnoses:  Vertigo      NEW MEDICATIONS STARTED DURING THIS VISIT:  New Prescriptions   MECLIZINE (ANTIVERT) 25 MG TABLET    Take 0.5 tablets (12.5 mg total) by mouth 3 (three) times daily as needed for dizziness.      Note:  This document was prepared using Dragon voice recognition software and may include unintentional dictation errors.   CHinda Kehr MD 10/27/15 2127

## 2015-10-27 NOTE — ED Notes (Signed)
Medication list given to pharmacy tech for reconciliation.

## 2015-10-27 NOTE — ED Notes (Signed)
CT tech to room.

## 2015-10-27 NOTE — ED Notes (Addendum)
Patient presents to the ED with dizziness and weakness since 10 am this morning.  Patient denies any weakness that is more on one side or the other.  Patient reports a similar episode on Sunday.  Patient reports that she will be starting radiation treatments in April for right sided breast cancer.

## 2015-10-27 NOTE — ED Notes (Signed)
AC chosen due to lack of other sites to choose from.

## 2015-10-27 NOTE — Telephone Encounter (Signed)
Ok to put in same day. Thanks.   

## 2015-10-27 NOTE — Telephone Encounter (Signed)
Patient called office with concerns of "dizzy spells" that have been occuring intermittent since Sunday 3/19. Patient reports that first episode occurred Sunday morning upon awakening she states she broke out in a sweat and felt dizzy. Patient states that episode Sunday only lasted a matter of minutes than subsided. Patient states this morning she had blurred vision but once she drank a cold glass of water her vision returned back to normal, she has had episodes today of dizziness and sweating that she states "comes in waves". Blood pressure checked right before patient called office was 143/94, she states fasting blood sugar this morning was 173, patient last office visit for diabetes and hypertension were on 2/28 and at visit patients condition was stable. Patient would like to come in for office visit, Dr. Venia Minks has opening tomorrow at 3:30PM for a same day appointment. Is it okay to take appt slot for patient to follow up?

## 2015-10-27 NOTE — Discharge Instructions (Signed)
We believe your symptoms were caused by benign vertigo.  Please read through the included information and take any prescribed medication(s).  Follow up with your doctor as listed above.  If you develop any new or worsening symptoms that concern you, including but not limited to persistent dizziness/vertigo, numbness or weakness in your arms or legs, altered mental status, persistent vomiting, or fever greater than 101, please return immediately to the Emergency Department.   Dizziness Dizziness is a common problem. It is a feeling of unsteadiness or light-headedness. You may feel like you are about to faint. Dizziness can lead to injury if you stumble or fall. Anyone can become dizzy, but dizziness is more common in older adults. This condition can be caused by a number of things, including medicines, dehydration, or illness. HOME CARE INSTRUCTIONS Taking these steps may help with your condition: Eating and Drinking  Drink enough fluid to keep your urine clear or pale yellow. This helps to keep you from becoming dehydrated. Try to drink more clear fluids, such as water.  Do not drink alcohol.  Limit your caffeine intake if directed by your health care provider.  Limit your salt intake if directed by your health care provider. Activity  Avoid making quick movements.  Rise slowly from chairs and steady yourself until you feel okay.  In the morning, first sit up on the side of the bed. When you feel okay, stand slowly while you hold onto something until you know that your balance is fine.  Move your legs often if you need to stand in one place for a long time. Tighten and relax your muscles in your legs while you are standing.  Do not drive or operate heavy machinery if you feel dizzy.  Avoid bending down if you feel dizzy. Place items in your home so that they are easy for you to reach without leaning over. Lifestyle  Do not use any tobacco products, including cigarettes, chewing  tobacco, or electronic cigarettes. If you need help quitting, ask your health care provider.  Try to reduce your stress level, such as with yoga or meditation. Talk with your health care provider if you need help. General Instructions  Watch your dizziness for any changes.  Take medicines only as directed by your health care provider. Talk with your health care provider if you think that your dizziness is caused by a medicine that you are taking.  Tell a friend or a family member that you are feeling dizzy. If he or she notices any changes in your behavior, have this person call your health care provider.  Keep all follow-up visits as directed by your health care provider. This is important. SEEK MEDICAL CARE IF:  Your dizziness does not go away.  Your dizziness or light-headedness gets worse.  You feel nauseous.  You have reduced hearing.  You have new symptoms.  You are unsteady on your feet or you feel like the room is spinning. SEEK IMMEDIATE MEDICAL CARE IF:  You vomit or have diarrhea and are unable to eat or drink anything.  You have problems talking, walking, swallowing, or using your arms, hands, or legs.  You feel generally weak.  You are not thinking clearly or you have trouble forming sentences. It may take a friend or family member to notice this.  You have chest pain, abdominal pain, shortness of breath, or sweating.  Your vision changes.  You notice any bleeding.  You have a headache.  You have neck pain or  a stiff neck.  You have a fever.   This information is not intended to replace advice given to you by your health care provider. Make sure you discuss any questions you have with your health care provider.   Document Released: 01/17/2001 Document Revised: 12/08/2014 Document Reviewed: 07/20/2014 Elsevier Interactive Patient Education [employer].

## 2015-10-28 ENCOUNTER — Ambulatory Visit (INDEPENDENT_AMBULATORY_CARE_PROVIDER_SITE_OTHER): Payer: Commercial Managed Care - HMO | Admitting: Family Medicine

## 2015-10-28 ENCOUNTER — Encounter: Payer: Self-pay | Admitting: Family Medicine

## 2015-10-28 VITALS — BP 150/88 | HR 76 | Temp 97.8°F | Resp 20 | Wt 225.0 lb

## 2015-10-28 DIAGNOSIS — H9202 Otalgia, left ear: Secondary | ICD-10-CM

## 2015-10-28 DIAGNOSIS — I10 Essential (primary) hypertension: Secondary | ICD-10-CM

## 2015-10-28 DIAGNOSIS — H8110 Benign paroxysmal vertigo, unspecified ear: Secondary | ICD-10-CM

## 2015-10-28 DIAGNOSIS — C50919 Malignant neoplasm of unspecified site of unspecified female breast: Secondary | ICD-10-CM | POA: Diagnosis not present

## 2015-10-28 NOTE — Progress Notes (Signed)
Patient ID: Leah Schaefer, female   DOB: 1941-03-18, 75 y.o.   MRN: [id number]       Patient: Leah Schaefer Female    DOB: 02-11-1941   75 y.o.   MRN: [id number] Visit Date: 10/28/2015  Today's Provider: Margarita Rana, MD   Chief Complaint  Patient presents with  . Dizziness  . Follow-up    ER  . Hypertension   Subjective:    HPI  Follow up ER visit  Patient was seen in ER for high blood pressure on 10/27/2015. She was treated for vertigo. Treatment for this included Meclizine. She reports excellent compliance with treatment. She reports this condition is Unchanged. Patient had CT scan and labs checked.   ------------------------------------------------------------------------------------   Vertigo - Dizziness: Patient presents with dizziness .  The dizziness has been present for 3 days. The patient describes the symptoms as disequalibirum, vertigo and lightheadedness. Symptoms are exacerbated by rapid head movements and rising from squatting or sitting position The patient also complains of left ear pain.  She has been treated with meclizine (Antivert) with good improvement.  Previous work up has been ER.   Hypertension, follow-up:  BP Readings from Last 3 Encounters:  10/28/15 150/88  10/27/15 149/85  10/18/15 124/80   Patient reports that since Sunday her blood pressure has been elevated.  She was last seen for hypertension 4 weeks ago.  BP at that visit was 126/74. Management changes since that visit include no changes. She reports excellent compliance with treatment. She is not having side effects. She is not exercising. She is adherent to low salt diet.   Outside blood pressures are high 173/104 on yesterday. She is experiencing none.  Patient denies chest pain.   Cardiovascular risk factors include advanced age (older than 38 for men, 37 for women) and diabetes mellitus.  Use of agents associated with hypertension: none.     ------------------------------------------------------------------------   No Known Allergies Previous Medications   ACETAMINOPHEN (TYLENOL) 500 MG TABLET    Take 500 mg by mouth every 6 (six) hours as needed for mild pain or headache.    ACYCLOVIR (ZOVIRAX) 200 MG CAPSULE    Take 200 mg by mouth daily.   ASPIRIN EC 81 MG TABLET    Take 81 mg by mouth daily.   ATORVASTATIN (LIPITOR) 10 MG TABLET    Take 10 mg by mouth at bedtime.   BIMATOPROST (LUMIGAN) 0.01 % SOLN    Place 1 drop into both eyes at bedtime.    CALCIUM CARB-CHOLECALCIFEROL (CALCIUM 600+D3) 600-800 MG-UNIT TABS    Take 1 tablet by mouth 2 (two) times daily.   CEFADROXIL (DURICEF) 500 MG CAPSULE    Take 1 capsule (500 mg total) by mouth 2 (two) times daily. Start one hour before office procedure on 11-01-15   CHOLECALCIFEROL (VITAMIN D) 1000 UNITS TABLET    Take 1,000 Units by mouth daily.   CYANOCOBALAMIN 500 MCG TABLET    Take 500 mcg by mouth daily.   ESCITALOPRAM (LEXAPRO) 20 MG TABLET    Take 20 mg by mouth daily.   GABAPENTIN (NEURONTIN) 300 MG CAPSULE    Take 2 capsules (600 mg total) by mouth at bedtime.   HYDROCODONE-ACETAMINOPHEN (NORCO/VICODIN) 5-325 MG TABLET    Take 1 tablet by mouth every 6 (six) hours as needed for moderate pain.    MECLIZINE (ANTIVERT) 25 MG TABLET    Take 0.5 tablets (12.5 mg total) by mouth 3 (three) times daily as needed for dizziness.  METOPROLOL TARTRATE (LOPRESSOR) 25 MG TABLET    Take 12.5 mg by mouth 2 (two) times daily.   MULTIPLE VITAMIN (MULTIVITAMIN WITH MINERALS) TABS TABLET    Take 1 tablet by mouth daily.   OMEGA-3 ACID ETHYL ESTERS (LOVAZA) 1 G CAPSULE    Take 3 g by mouth daily.    OMEPRAZOLE (PRILOSEC) 20 MG CAPSULE    Take 20 mg by mouth 2 (two) times daily.   PYRIDOXINE (B-6) 100 MG TABLET    Take 100 mg by mouth daily.   VITAMIN C (ASCORBIC ACID) 500 MG TABLET    Take 1,000 mg by mouth daily.   VITAMIN E 400 UNIT CAPSULE    Take 400 Units by mouth daily.    Review of  Systems  Constitutional: Positive for activity change.  HENT: Positive for ear pain.   Cardiovascular: Negative.   Skin: Positive for pallor.  Neurological: Positive for dizziness and headaches.    Social History  Substance Use Topics  . Smoking status: Never Smoker   . Smokeless tobacco: Never Used  . Alcohol Use: No   Objective:   BP 150/88 mmHg  Pulse 76  Temp(Src) 97.8 F (36.6 C) (Oral)  Resp 20  Wt 225 lb (102.059 kg)  Physical Exam  Constitutional: She is oriented to person, place, and time. She appears well-developed and well-nourished.  HENT:  Head: Normocephalic and atraumatic.  Right Ear: External ear normal.  Cardiovascular: Normal rate, regular rhythm and normal heart sounds.   Pulmonary/Chest: Effort normal and breath sounds normal.  Neurological: She is alert and oriented to person, place, and time.  Psychiatric: She has a normal mood and affect. Her behavior is normal. Judgment and thought content normal.      Assessment & Plan:     1. Benign paroxysmal positional vertigo, unspecified laterally Suspect cause of dizziness.  Gave handout on dizziness and labyrinth exercises. Explaineed BPPV at length.  Call if worsens or does not improve and will consider referral to ENT.    2. Essential (primary) hypertension Elevated today.  Continue current medication. Will monitor and adjust if needed.  3. Malignant neoplasm of female breast, unspecified laterality, unspecified site of breast [facility]) Currently undergoing treatment for recurrence.      Patient was seen and examined by Jerrell Belfast, MD, and note scribed by Lynford Humphrey, [place].   I have reviewed the document for accuracy and completeness and I agree with above. - Jerrell Belfast, MD  Margarita Rana, MD  [facility]

## 2015-11-01 ENCOUNTER — Encounter: Payer: Self-pay | Admitting: General Surgery

## 2015-11-01 ENCOUNTER — Ambulatory Visit: Payer: Self-pay

## 2015-11-01 ENCOUNTER — Ambulatory Visit (INDEPENDENT_AMBULATORY_CARE_PROVIDER_SITE_OTHER): Payer: Commercial Managed Care - HMO | Admitting: General Surgery

## 2015-11-01 VITALS — BP 136/86 | HR 85 | Resp 14 | Ht 67.0 in | Wt 224.0 lb

## 2015-11-01 DIAGNOSIS — C50911 Malignant neoplasm of unspecified site of right female breast: Secondary | ICD-10-CM

## 2015-11-01 NOTE — Progress Notes (Signed)
Patient ID: Leah Schaefer, female   DOB: 12/01/1940, 75 y.o.   MRN: [id number]  Chief Complaint  Patient presents with  . Procedure    right mammosite    HPI Leah Schaefer is a 75 y.o. female here today for a right mammosite placement.  HPI  Past Medical History  Diagnosis Date  . Hernia 2011  . Cystitis   . 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 Personal history of malignant neoplasm of breast   . Hypertension   . GERD (gastroesophageal reflux disease)   . Diabetes mellitus without complication (HCC)     NO MEDS-DIET CONTROLLED  . 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 Chemotherapy/post wide excision whole breast radiation.  . Breast cancer ([place])   . 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    Past Surgical History  Procedure Laterality Date  . Colonoscopy  2011    Dr OCandace Cruise . Hernia repair Right 2009    inguinal, and umbilical  . Cholecystectomy  2007  . Dilation and curettage of uterus  2004  . Portacath placement  2013  . Upper gi endoscopy  2011  . Breast surgery Left 2012    wide excision  . Breast surgery Left November 2013    Core biopsy of the upper-outer quadrant showed fat necrosis.  . Colonoscopy with propofol N/A 02/25/2015    Procedure: COLONOSCOPY WITH PROPOFOL;  Surgeon: PHulen Luster MD;  [facility];  Service: Gastroenterology;  Laterality: N/A;  . Breast biopsy Left 2013  . Port-a-cath removal    . Breast lumpectomy with sentinel lymph node biopsy Right 09/30/2015    Procedure: BREAST LUMPECTOMY WITH SENTINEL LYMPH NODE BX;  Surgeon: JRobert Bellow MD;  Location: [facility];  Service: General;  Laterality: Right;    Family History  Problem Relation Age of Onset  . Ovarian cancer Sister   . Lymphoma Father   . Colon cancer  Brother   . Lymphoma Brother   . Breast cancer Neg Hx     Social History Social History  Substance Use Topics  . Smoking status: Never Smoker   . Smokeless tobacco: Never Used  . Alcohol Use: No    No Known Allergies  Current Outpatient Prescriptions  Medication Sig Dispense Refill  . acetaminophen (TYLENOL) 500 MG tablet Take 500 mg by mouth every 6 (six) hours as needed for mild pain or headache.     .Marland Kitchenacyclovir (ZOVIRAX) 200 MG capsule Take 200 mg by mouth daily.    .Marland Kitchenaspirin EC 81 MG tablet Take 81 mg by mouth daily.    .Marland Kitchenatorvastatin (LIPITOR) 10 MG tablet Take 10 mg by mouth at bedtime.    . bimatoprost (LUMIGAN) 0.01 % SOLN Place 1 drop into both eyes at bedtime.     . Calcium Carb-Cholecalciferol (CALCIUM 600+D3) 600-800 MG-UNIT TABS Take 1 tablet by mouth 2 (two) times daily.    . cefadroxil (DURICEF) 500 MG capsule Take 1 capsule (500 mg total) by mouth 2 (two) times daily. Start one hour before office procedure on 11-01-15 24 capsule 0  . cholecalciferol (VITAMIN D) 1000 units tablet Take 1,000 Units by mouth daily.    . cyanocobalamin 500 MCG tablet Take 500 mcg by mouth daily.    .Marland Kitchen  escitalopram (LEXAPRO) 20 MG tablet Take 20 mg by mouth daily.    Marland Kitchen gabapentin (NEURONTIN) 300 MG capsule Take 2 capsules (600 mg total) by mouth at bedtime. 180 capsule 3  . HYDROcodone-acetaminophen (NORCO/VICODIN) 5-325 MG tablet Take 1 tablet by mouth every 6 (six) hours as needed for moderate pain.     . meclizine (ANTIVERT) 25 MG tablet Take 0.5 tablets (12.5 mg total) by mouth 3 (three) times daily as needed for dizziness. 15 tablet 0  . metoprolol tartrate (LOPRESSOR) 25 MG tablet Take 12.5 mg by mouth 2 (two) times daily.    . Multiple Vitamin (MULTIVITAMIN WITH MINERALS) TABS tablet Take 1 tablet by mouth daily.    Marland Kitchen omega-3 acid ethyl esters (LOVAZA) 1 g capsule Take 3 g by mouth daily.     Marland Kitchen omeprazole (PRILOSEC) 20 MG capsule Take 20 mg by mouth 2 (two) times daily.    Marland Kitchen  pyridoxine (B-6) 100 MG tablet Take 100 mg by mouth daily.    . vitamin C (ASCORBIC ACID) 500 MG tablet Take 1,000 mg by mouth daily.    . vitamin E 400 UNIT capsule Take 400 Units by mouth daily.     No current facility-administered medications for this visit.    Review of Systems Review of Systems  Constitutional: Negative.   Respiratory: Negative.   Cardiovascular: Negative.     Blood pressure 136/86, pulse 85, resp. rate 14, height '5\' 7"'  (1.702 m), weight 224 lb (101.606 kg).  Physical Exam Physical Exam  Pulmonary/Chest:      Data Reviewed ED records. Radiation oncology consultation. Postural VS: Negative for orthostatic change.   Assessment    Candidate for Mammosite placement.      Plan    The patient had made use of the oral antibiotics as requested. She was accompanied today by her daughter who was present for the interview and procedure.  Ultrasound examination of the upper inner quadrant of the right breast showed a well-defined seroma cavity measuring 1.7 x 2.8 x 3.6 cm. 10 mL of 0.5% Xylocaine with 0.25% Marcaine with 1-200,000 epinephrine was utilized well tolerated. The area was prepped with ChloraPrep and draped. An 8 mm trocar was placed into the seroma cavity and a small volume of fluid retrieved. No evidence of infection. A cavity evaluation device was placed under ultrasound guidance. Inflation at 35 cm obliterated cavity. Spherical inflation. This was removed and replaced with a treatment balloon inflated with 35 mL of saline/Omnipaque. The minimal skin spacing was 1.33 meters. Bacitracin was applied to the balloon exit site which was on the medial aspect of the breast. Fluffed gauze dressing applied.  Catheter care reviewed. The patient is scheduled for her simulation on Thursday, March 30. We'll plan for follow-up exam after the balloon is removed.    PCP:  Margarita Rana This information has been scribed by Gaspar Cola CMA.    Robert Bellow 11/01/2015, 8:43 PM

## 2015-11-01 NOTE — Patient Instructions (Signed)
Patient care kit given to patient.  Instructed no showers, sponge bath while mammosite in place, take antibiotic. Follow up with [facility] as arranged. Discussed wearing your bra for support at all times. 

## 2015-11-03 ENCOUNTER — Ambulatory Visit: Payer: Commercial Managed Care - HMO | Admitting: General Surgery

## 2015-11-04 ENCOUNTER — Ambulatory Visit: Payer: Commercial Managed Care - HMO

## 2015-11-04 ENCOUNTER — Ambulatory Visit
Admission: RE | Admit: 2015-11-04 | Discharge: 2015-11-04 | Disposition: A | Discharge status: Home / Self Care | Payer: Commercial Managed Care - HMO | Source: Ambulatory Visit | Attending: Radiation Oncology | Admitting: Radiation Oncology

## 2015-11-04 DIAGNOSIS — C50411 Malignant neoplasm of upper-outer quadrant of right female breast: Secondary | ICD-10-CM | POA: Diagnosis not present

## 2015-11-04 DIAGNOSIS — Z800 Family history of malignant neoplasm of digestive organs: Secondary | ICD-10-CM | POA: Diagnosis not present

## 2015-11-04 DIAGNOSIS — I10 Essential (primary) hypertension: Secondary | ICD-10-CM | POA: Diagnosis not present

## 2015-11-04 DIAGNOSIS — K219 Gastro-esophageal reflux disease without esophagitis: Secondary | ICD-10-CM | POA: Diagnosis not present

## 2015-11-04 DIAGNOSIS — E119 Type 2 diabetes mellitus without complications: Secondary | ICD-10-CM | POA: Diagnosis not present

## 2015-11-04 DIAGNOSIS — C50212 Malignant neoplasm of upper-inner quadrant of left female breast: Secondary | ICD-10-CM | POA: Diagnosis not present

## 2015-11-04 DIAGNOSIS — Z7982 Long term (current) use of aspirin: Secondary | ICD-10-CM | POA: Diagnosis not present

## 2015-11-04 DIAGNOSIS — Z79899 Other long term (current) drug therapy: Secondary | ICD-10-CM | POA: Diagnosis not present

## 2015-11-04 DIAGNOSIS — Z170 Estrogen receptor positive status [ER+]: Secondary | ICD-10-CM | POA: Diagnosis not present

## 2015-11-04 DIAGNOSIS — Z510 Encounter for antineoplastic radiation therapy: Secondary | ICD-10-CM | POA: Diagnosis not present

## 2015-11-04 DIAGNOSIS — Z8041 Family history of malignant neoplasm of ovary: Secondary | ICD-10-CM | POA: Diagnosis not present

## 2015-11-04 DIAGNOSIS — Z853 Personal history of malignant neoplasm of breast: Secondary | ICD-10-CM | POA: Diagnosis not present

## 2015-11-04 DIAGNOSIS — N3090 Cystitis, unspecified without hematuria: Secondary | ICD-10-CM | POA: Diagnosis not present

## 2015-11-04 DIAGNOSIS — Z807 Family history of other malignant neoplasms of lymphoid, hematopoietic and related tissues: Secondary | ICD-10-CM | POA: Diagnosis not present

## 2015-11-04 DIAGNOSIS — C50211 Malignant neoplasm of upper-inner quadrant of right female breast: Secondary | ICD-10-CM | POA: Diagnosis not present

## 2015-11-08 ENCOUNTER — Ambulatory Visit
Admission: RE | Admit: 2015-11-08 | Discharge: 2015-11-08 | Disposition: A | Discharge status: Home / Self Care | Payer: Commercial Managed Care - HMO | Source: Ambulatory Visit | Attending: Radiation Oncology | Admitting: Radiation Oncology

## 2015-11-08 DIAGNOSIS — E119 Type 2 diabetes mellitus without complications: Secondary | ICD-10-CM | POA: Diagnosis not present

## 2015-11-08 DIAGNOSIS — Z7982 Long term (current) use of aspirin: Secondary | ICD-10-CM | POA: Diagnosis not present

## 2015-11-08 DIAGNOSIS — C50411 Malignant neoplasm of upper-outer quadrant of right female breast: Secondary | ICD-10-CM | POA: Diagnosis not present

## 2015-11-08 DIAGNOSIS — Z853 Personal history of malignant neoplasm of breast: Secondary | ICD-10-CM | POA: Diagnosis not present

## 2015-11-08 DIAGNOSIS — Z79899 Other long term (current) drug therapy: Secondary | ICD-10-CM | POA: Diagnosis not present

## 2015-11-08 DIAGNOSIS — Z170 Estrogen receptor positive status [ER+]: Secondary | ICD-10-CM | POA: Diagnosis not present

## 2015-11-08 DIAGNOSIS — N3090 Cystitis, unspecified without hematuria: Secondary | ICD-10-CM | POA: Diagnosis not present

## 2015-11-08 DIAGNOSIS — Z510 Encounter for antineoplastic radiation therapy: Secondary | ICD-10-CM | POA: Diagnosis not present

## 2015-11-08 DIAGNOSIS — C50211 Malignant neoplasm of upper-inner quadrant of right female breast: Secondary | ICD-10-CM | POA: Diagnosis not present

## 2015-11-08 DIAGNOSIS — K219 Gastro-esophageal reflux disease without esophagitis: Secondary | ICD-10-CM | POA: Diagnosis not present

## 2015-11-08 DIAGNOSIS — C50212 Malignant neoplasm of upper-inner quadrant of left female breast: Secondary | ICD-10-CM | POA: Diagnosis not present

## 2015-11-08 DIAGNOSIS — I10 Essential (primary) hypertension: Secondary | ICD-10-CM | POA: Diagnosis not present

## 2015-11-09 ENCOUNTER — Ambulatory Visit
Admission: RE | Admit: 2015-11-09 | Discharge: 2015-11-09 | Disposition: A | Discharge status: Home / Self Care | Payer: Commercial Managed Care - HMO | Source: Ambulatory Visit | Attending: Radiation Oncology | Admitting: Radiation Oncology

## 2015-11-09 DIAGNOSIS — I10 Essential (primary) hypertension: Secondary | ICD-10-CM | POA: Diagnosis not present

## 2015-11-09 DIAGNOSIS — C50211 Malignant neoplasm of upper-inner quadrant of right female breast: Secondary | ICD-10-CM | POA: Diagnosis not present

## 2015-11-09 DIAGNOSIS — N3090 Cystitis, unspecified without hematuria: Secondary | ICD-10-CM | POA: Diagnosis not present

## 2015-11-09 DIAGNOSIS — K219 Gastro-esophageal reflux disease without esophagitis: Secondary | ICD-10-CM | POA: Diagnosis not present

## 2015-11-09 DIAGNOSIS — Z510 Encounter for antineoplastic radiation therapy: Secondary | ICD-10-CM | POA: Diagnosis not present

## 2015-11-09 DIAGNOSIS — Z853 Personal history of malignant neoplasm of breast: Secondary | ICD-10-CM | POA: Diagnosis not present

## 2015-11-09 DIAGNOSIS — Z170 Estrogen receptor positive status [ER+]: Secondary | ICD-10-CM | POA: Diagnosis not present

## 2015-11-09 DIAGNOSIS — E119 Type 2 diabetes mellitus without complications: Secondary | ICD-10-CM | POA: Diagnosis not present

## 2015-11-09 DIAGNOSIS — C50212 Malignant neoplasm of upper-inner quadrant of left female breast: Secondary | ICD-10-CM | POA: Diagnosis not present

## 2015-11-09 DIAGNOSIS — Z7982 Long term (current) use of aspirin: Secondary | ICD-10-CM | POA: Diagnosis not present

## 2015-11-09 DIAGNOSIS — C50411 Malignant neoplasm of upper-outer quadrant of right female breast: Secondary | ICD-10-CM | POA: Diagnosis not present

## 2015-11-09 DIAGNOSIS — Z79899 Other long term (current) drug therapy: Secondary | ICD-10-CM | POA: Diagnosis not present

## 2015-11-10 ENCOUNTER — Ambulatory Visit
Admission: RE | Admit: 2015-11-10 | Discharge: 2015-11-10 | Disposition: A | Discharge status: Home / Self Care | Payer: Commercial Managed Care - HMO | Source: Ambulatory Visit | Attending: Radiation Oncology | Admitting: Radiation Oncology

## 2015-11-10 DIAGNOSIS — Z7982 Long term (current) use of aspirin: Secondary | ICD-10-CM | POA: Diagnosis not present

## 2015-11-10 DIAGNOSIS — Z170 Estrogen receptor positive status [ER+]: Secondary | ICD-10-CM | POA: Diagnosis not present

## 2015-11-10 DIAGNOSIS — Z853 Personal history of malignant neoplasm of breast: Secondary | ICD-10-CM | POA: Diagnosis not present

## 2015-11-10 DIAGNOSIS — Z510 Encounter for antineoplastic radiation therapy: Secondary | ICD-10-CM | POA: Diagnosis not present

## 2015-11-10 DIAGNOSIS — I10 Essential (primary) hypertension: Secondary | ICD-10-CM | POA: Diagnosis not present

## 2015-11-10 DIAGNOSIS — N3090 Cystitis, unspecified without hematuria: Secondary | ICD-10-CM | POA: Diagnosis not present

## 2015-11-10 DIAGNOSIS — C50211 Malignant neoplasm of upper-inner quadrant of right female breast: Secondary | ICD-10-CM | POA: Diagnosis not present

## 2015-11-10 DIAGNOSIS — Z79899 Other long term (current) drug therapy: Secondary | ICD-10-CM | POA: Diagnosis not present

## 2015-11-10 DIAGNOSIS — K219 Gastro-esophageal reflux disease without esophagitis: Secondary | ICD-10-CM | POA: Diagnosis not present

## 2015-11-10 DIAGNOSIS — E119 Type 2 diabetes mellitus without complications: Secondary | ICD-10-CM | POA: Diagnosis not present

## 2015-11-10 DIAGNOSIS — C50212 Malignant neoplasm of upper-inner quadrant of left female breast: Secondary | ICD-10-CM | POA: Diagnosis not present

## 2015-11-10 DIAGNOSIS — C50411 Malignant neoplasm of upper-outer quadrant of right female breast: Secondary | ICD-10-CM | POA: Diagnosis not present

## 2015-11-11 ENCOUNTER — Ambulatory Visit
Admission: RE | Admit: 2015-11-11 | Discharge: 2015-11-11 | Disposition: A | Discharge status: Home / Self Care | Payer: Commercial Managed Care - HMO | Source: Ambulatory Visit | Attending: Radiation Oncology | Admitting: Radiation Oncology

## 2015-11-11 DIAGNOSIS — E119 Type 2 diabetes mellitus without complications: Secondary | ICD-10-CM | POA: Diagnosis not present

## 2015-11-11 DIAGNOSIS — C50212 Malignant neoplasm of upper-inner quadrant of left female breast: Secondary | ICD-10-CM | POA: Diagnosis not present

## 2015-11-11 DIAGNOSIS — Z853 Personal history of malignant neoplasm of breast: Secondary | ICD-10-CM | POA: Diagnosis not present

## 2015-11-11 DIAGNOSIS — C50411 Malignant neoplasm of upper-outer quadrant of right female breast: Secondary | ICD-10-CM | POA: Diagnosis not present

## 2015-11-11 DIAGNOSIS — K219 Gastro-esophageal reflux disease without esophagitis: Secondary | ICD-10-CM | POA: Diagnosis not present

## 2015-11-11 DIAGNOSIS — Z7982 Long term (current) use of aspirin: Secondary | ICD-10-CM | POA: Diagnosis not present

## 2015-11-11 DIAGNOSIS — I10 Essential (primary) hypertension: Secondary | ICD-10-CM | POA: Diagnosis not present

## 2015-11-11 DIAGNOSIS — Z510 Encounter for antineoplastic radiation therapy: Secondary | ICD-10-CM | POA: Diagnosis not present

## 2015-11-11 DIAGNOSIS — N3090 Cystitis, unspecified without hematuria: Secondary | ICD-10-CM | POA: Diagnosis not present

## 2015-11-11 DIAGNOSIS — C50211 Malignant neoplasm of upper-inner quadrant of right female breast: Secondary | ICD-10-CM | POA: Diagnosis not present

## 2015-11-11 DIAGNOSIS — Z79899 Other long term (current) drug therapy: Secondary | ICD-10-CM | POA: Diagnosis not present

## 2015-11-11 DIAGNOSIS — Z170 Estrogen receptor positive status [ER+]: Secondary | ICD-10-CM | POA: Diagnosis not present

## 2015-11-12 ENCOUNTER — Ambulatory Visit
Admission: RE | Admit: 2015-11-12 | Discharge: 2015-11-12 | Disposition: A | Discharge status: Home / Self Care | Payer: Commercial Managed Care - HMO | Source: Ambulatory Visit | Attending: Radiation Oncology | Admitting: Radiation Oncology

## 2015-11-12 DIAGNOSIS — C50411 Malignant neoplasm of upper-outer quadrant of right female breast: Secondary | ICD-10-CM | POA: Diagnosis not present

## 2015-11-12 DIAGNOSIS — Z510 Encounter for antineoplastic radiation therapy: Secondary | ICD-10-CM | POA: Diagnosis not present

## 2015-11-12 DIAGNOSIS — K219 Gastro-esophageal reflux disease without esophagitis: Secondary | ICD-10-CM | POA: Diagnosis not present

## 2015-11-12 DIAGNOSIS — C50212 Malignant neoplasm of upper-inner quadrant of left female breast: Secondary | ICD-10-CM | POA: Diagnosis not present

## 2015-11-12 DIAGNOSIS — Z7982 Long term (current) use of aspirin: Secondary | ICD-10-CM | POA: Diagnosis not present

## 2015-11-12 DIAGNOSIS — Z170 Estrogen receptor positive status [ER+]: Secondary | ICD-10-CM | POA: Diagnosis not present

## 2015-11-12 DIAGNOSIS — I10 Essential (primary) hypertension: Secondary | ICD-10-CM | POA: Diagnosis not present

## 2015-11-12 DIAGNOSIS — N3090 Cystitis, unspecified without hematuria: Secondary | ICD-10-CM | POA: Diagnosis not present

## 2015-11-12 DIAGNOSIS — E119 Type 2 diabetes mellitus without complications: Secondary | ICD-10-CM | POA: Diagnosis not present

## 2015-11-12 DIAGNOSIS — Z853 Personal history of malignant neoplasm of breast: Secondary | ICD-10-CM | POA: Diagnosis not present

## 2015-11-12 DIAGNOSIS — Z79899 Other long term (current) drug therapy: Secondary | ICD-10-CM | POA: Diagnosis not present

## 2015-11-12 DIAGNOSIS — C50211 Malignant neoplasm of upper-inner quadrant of right female breast: Secondary | ICD-10-CM | POA: Diagnosis not present

## 2015-11-15 DIAGNOSIS — I10 Essential (primary) hypertension: Secondary | ICD-10-CM | POA: Diagnosis not present

## 2015-11-15 DIAGNOSIS — C50212 Malignant neoplasm of upper-inner quadrant of left female breast: Secondary | ICD-10-CM | POA: Diagnosis not present

## 2015-11-15 DIAGNOSIS — E119 Type 2 diabetes mellitus without complications: Secondary | ICD-10-CM | POA: Diagnosis not present

## 2015-11-15 DIAGNOSIS — Z7982 Long term (current) use of aspirin: Secondary | ICD-10-CM | POA: Diagnosis not present

## 2015-11-15 DIAGNOSIS — Z170 Estrogen receptor positive status [ER+]: Secondary | ICD-10-CM | POA: Diagnosis not present

## 2015-11-15 DIAGNOSIS — Z79899 Other long term (current) drug therapy: Secondary | ICD-10-CM | POA: Diagnosis not present

## 2015-11-15 DIAGNOSIS — K219 Gastro-esophageal reflux disease without esophagitis: Secondary | ICD-10-CM | POA: Diagnosis not present

## 2015-11-15 DIAGNOSIS — N3090 Cystitis, unspecified without hematuria: Secondary | ICD-10-CM | POA: Diagnosis not present

## 2015-11-15 DIAGNOSIS — Z510 Encounter for antineoplastic radiation therapy: Secondary | ICD-10-CM | POA: Diagnosis not present

## 2015-11-16 ENCOUNTER — Ambulatory Visit (INDEPENDENT_AMBULATORY_CARE_PROVIDER_SITE_OTHER): Payer: Commercial Managed Care - HMO | Admitting: General Surgery

## 2015-11-16 ENCOUNTER — Encounter: Payer: Self-pay | Admitting: General Surgery

## 2015-11-16 VITALS — BP 136/72 | HR 77 | Resp 12 | Ht 67.0 in | Wt 225.0 lb

## 2015-11-16 DIAGNOSIS — C50211 Malignant neoplasm of upper-inner quadrant of right female breast: Secondary | ICD-10-CM

## 2015-11-16 MED ORDER — LETROZOLE 2.5 MG PO TABS: 2.5000 mg | ORAL_TABLET | Freq: Every day | ORAL | Status: DC

## 2015-11-16 NOTE — Progress Notes (Signed)
Patient ID: Leah Schaefer, female   DOB: 25-Oct-1940, 75 y.o.   MRN: [id number]  Chief Complaint  Patient presents with  . Follow-up    mammosite     HPI Leah Schaefer is a 75 y.o. female. here today for her two week follow up right mammosite. Her last treatment was on Friday. She states she is doing well   HPI  Past Medical History  Diagnosis Date  . Hernia 2011  . Cystitis   . 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 Personal history of malignant neoplasm of breast   . Hypertension   . GERD (gastroesophageal reflux disease)   . Diabetes mellitus without complication (HCC)     NO MEDS-DIET CONTROLLED  . 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 Chemotherapy/post wide excision whole breast radiation.  . Breast cancer ([place])   . 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    Past Surgical History  Procedure Laterality Date  . Colonoscopy  2011    Dr OCandace Cruise . Hernia repair Right 2009    inguinal, and umbilical  . Cholecystectomy  2007  . Dilation and curettage of uterus  2004  . Portacath placement  2013  . Upper gi endoscopy  2011  . Breast surgery Left 2012    wide excision  . Breast surgery Left November 2013    Core biopsy of the upper-outer quadrant showed fat necrosis.  . Colonoscopy with propofol N/A 02/25/2015    Procedure: COLONOSCOPY WITH PROPOFOL;  Surgeon: PHulen Luster MD;  [facility];  Service: Gastroenterology;  Laterality: N/A;  . Breast biopsy Left 2013  . Port-a-cath removal    . Breast lumpectomy with sentinel lymph node biopsy Right 09/30/2015    Procedure: BREAST LUMPECTOMY WITH SENTINEL LYMPH NODE BX;  Surgeon: JRobert Bellow MD;  Location: [facility];  Service: General;  Laterality: Right;    Family History  Problem Relation Age of  Onset  . Ovarian cancer Sister   . Lymphoma Father   . Colon cancer Brother   . Lymphoma Brother   . Breast cancer Neg Hx     Social History Social History  Substance Use Topics  . Smoking status: Never Smoker   . Smokeless tobacco: Never Used  . Alcohol Use: No    No Known Allergies  Current Outpatient Prescriptions  Medication Sig Dispense Refill  . acetaminophen (TYLENOL) 500 MG tablet Take 500 mg by mouth every 6 (six) hours as needed for mild pain or headache.     .Marland Kitchenacyclovir (ZOVIRAX) 200 MG capsule Take 200 mg by mouth daily.    .Marland Kitchenaspirin EC 81 MG tablet Take 81 mg by mouth daily.    .Marland Kitchenatorvastatin (LIPITOR) 10 MG tablet Take 10 mg by mouth at bedtime.    . bimatoprost (LUMIGAN) 0.01 % SOLN Place 1 drop into both eyes at bedtime.     . Calcium Carb-Cholecalciferol (CALCIUM 600+D3) 600-800 MG-UNIT TABS Take 1 tablet by mouth 2 (two) times daily.    . cefadroxil (DURICEF) 500 MG capsule Take 1 capsule (500 mg total) by mouth 2 (two) times daily. Start one hour before office procedure on 11-01-15 24 capsule 0  . cholecalciferol (VITAMIN D) 1000 units tablet Take 1,000 Units by mouth daily.    .Marland Kitchen  cyanocobalamin 500 MCG tablet Take 500 mcg by mouth daily.    Marland Kitchen escitalopram (LEXAPRO) 20 MG tablet Take 20 mg by mouth daily.    Marland Kitchen gabapentin (NEURONTIN) 300 MG capsule Take 2 capsules (600 mg total) by mouth at bedtime. 180 capsule 3  . HYDROcodone-acetaminophen (NORCO/VICODIN) 5-325 MG tablet Take 1 tablet by mouth every 6 (six) hours as needed for moderate pain.     . meclizine (ANTIVERT) 25 MG tablet Take 0.5 tablets (12.5 mg total) by mouth 3 (three) times daily as needed for dizziness. 15 tablet 0  . metoprolol tartrate (LOPRESSOR) 25 MG tablet Take 12.5 mg by mouth 2 (two) times daily.    . Multiple Vitamin (MULTIVITAMIN WITH MINERALS) TABS tablet Take 1 tablet by mouth daily.    Marland Kitchen omega-3 acid ethyl esters (LOVAZA) 1 g capsule Take 3 g by mouth daily.     Marland Kitchen omeprazole (PRILOSEC)  20 MG capsule Take 20 mg by mouth 2 (two) times daily.    Marland Kitchen pyridoxine (B-6) 100 MG tablet Take 100 mg by mouth daily.    . vitamin C (ASCORBIC ACID) 500 MG tablet Take 1,000 mg by mouth daily.    . vitamin E 400 UNIT capsule Take 400 Units by mouth daily.    Marland Kitchen letrozole (FEMARA) 2.5 MG tablet Take 1 tablet (2.5 mg total) by mouth daily. 30 tablet 11   No current facility-administered medications for this visit.    Review of Systems Review of Systems  Constitutional: Negative.   Respiratory: Negative.   Cardiovascular: Negative.     Blood pressure 136/72, pulse 77, resp. rate 12, height '5\' 7"'  (1.702 m), weight 225 lb (102.059 kg).  Physical Exam Physical Exam  Constitutional: She is oriented to person, place, and time. She appears well-developed and well-nourished.  Pulmonary/Chest:    Mammosite incision is clean and healing well.   Neurological: She is alert and oriented to person, place, and time.  Skin: Skin is warm and dry.    Data Reviewed CT studies completed during radiation therapy reviewed.  Assessment    Doing well status post axillary breast radiation.    Plan    Need to have a formal evaluation with medical oncology discussed. Patient declines at this time. Previous tumor was triple negative, present tumor is ER/PR positive, HER-2/neu not amplified. Candidate for aromatase therapy. Side effects of medical therapy reviewed including muscle/arthritis symptoms, vasomotor symptoms, acceleration of osteoporosis.  Patient is presently taking 1200 mg of calcium daily.  We'll confirm that she has had a bone density in the last 2 years.     Patient to return in one month.  PCP:  Margarita Rana This information has been scribed by Gaspar Cola CMA.    Robert Bellow 11/16/2015, 9:24 PM

## 2015-11-16 NOTE — Patient Instructions (Addendum)
Return in one month  Patient has been scheduled for Bone Density exam at [facility] on 11/25/15 at 1:00 pm.  Patient should not take any calcium supplements the day of procedure.

## 2015-11-25 ENCOUNTER — Ambulatory Visit
Admission: RE | Admit: 2015-11-25 | Discharge: 2015-11-25 | Disposition: A | Discharge status: Home / Self Care | Payer: Commercial Managed Care - HMO | Source: Ambulatory Visit | Attending: General Surgery | Admitting: General Surgery

## 2015-11-25 ENCOUNTER — Telehealth: Payer: Self-pay

## 2015-11-25 DIAGNOSIS — C50211 Malignant neoplasm of upper-inner quadrant of right female breast: Secondary | ICD-10-CM | POA: Insufficient documentation

## 2015-11-25 DIAGNOSIS — Z13820 Encounter for screening for osteoporosis: Secondary | ICD-10-CM | POA: Diagnosis not present

## 2015-11-25 NOTE — Telephone Encounter (Signed)
-----   Message from Robert Bellow, MD sent at 11/25/2015  2:58 PM EDT ----- Please notify the patient had her bone density was normal. She should continue making use of calcium, 1200 mg per day with vitamin D. Repeat in 2 years.  ----- Message -----    From: Rad Results In Interface    Sent: 11/25/2015   1:28 PM      To: Robert Bellow, MD

## 2015-11-25 NOTE — Telephone Encounter (Signed)
Notified patient as instructed, patient pleased. Discussed follow-up appointments, patient agrees  

## 2015-11-29 DIAGNOSIS — H16141 Punctate keratitis, right eye: Secondary | ICD-10-CM | POA: Diagnosis not present

## 2015-11-29 DIAGNOSIS — H5210 Myopia, unspecified eye: Secondary | ICD-10-CM | POA: Diagnosis not present

## 2015-12-13 ENCOUNTER — Encounter: Payer: Self-pay | Admitting: General Surgery

## 2015-12-13 ENCOUNTER — Ambulatory Visit (INDEPENDENT_AMBULATORY_CARE_PROVIDER_SITE_OTHER): Payer: Commercial Managed Care - HMO | Admitting: General Surgery

## 2015-12-13 VITALS — BP 130/78 | HR 78 | Resp 14 | Ht 67.0 in | Wt 222.0 lb

## 2015-12-13 DIAGNOSIS — C50211 Malignant neoplasm of upper-inner quadrant of right female breast: Secondary | ICD-10-CM

## 2015-12-13 NOTE — Progress Notes (Signed)
Patient ID: Leah Schaefer, female   DOB: 1941-06-17, 75 y.o.   MRN: [id number]  Chief Complaint  Patient presents with  . Follow-up    Breast Cancer    HPI Leah Schaefer is a 75 y.o. female here today for breast cancer follow up. She states she is doing well. She is taking Femara and handling it well. She has an appointment at the [facility] on 12/24/15 to see Dr. Burlene Arnt.   HPI  Past Medical History  Diagnosis Date  . Hernia 2011  . Cystitis   . 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 Personal history of malignant neoplasm of breast   . Hypertension   . GERD (gastroesophageal reflux disease)   . Diabetes mellitus without complication (HCC)     NO MEDS-DIET CONTROLLED  . 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 Chemotherapy/post wide excision whole breast radiation.  . Breast cancer ([place])   . 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    Past Surgical History  Procedure Laterality Date  . Colonoscopy  2011    Dr OCandace Cruise . Hernia repair Right 2009    inguinal, and umbilical  . Cholecystectomy  2007  . Dilation and curettage of uterus  2004  . Portacath placement  2013  . Upper gi endoscopy  2011  . Breast surgery Left 2012    wide excision  . Breast surgery Left November 2013    Core biopsy of the upper-outer quadrant showed fat necrosis.  . Colonoscopy with propofol N/A 02/25/2015    Procedure: COLONOSCOPY WITH PROPOFOL;  Surgeon: PHulen Luster MD;  [facility]  Service: Gastroenterology;  Laterality: N/A;  . Breast biopsy Left 2013  . Port-a-cath removal    . Breast lumpectomy with sentinel lymph node biopsy Right 09/30/2015    Procedure: BREAST LUMPECTOMY WITH SENTINEL LYMPH NODE BX;  Surgeon: JRobert Bellow MD;  Location: [facility]S;  Service:  General;  Laterality: Right;    Family History  Problem Relation Age of Onset  . Ovarian cancer Sister   . Lymphoma Father   . Colon cancer Brother   . Lymphoma Brother   . Breast cancer Neg Hx     Social History Social History  Substance Use Topics  . Smoking status: Never Smoker   . Smokeless tobacco: Never Used  . Alcohol Use: No    No Known Allergies  Current Outpatient Prescriptions  Medication Sig Dispense Refill  . acetaminophen (TYLENOL) 500 MG tablet Take 500 mg by mouth every 6 (six) hours as needed for mild pain or headache.     .Marland Kitchenacyclovir (ZOVIRAX) 200 MG capsule Take 200 mg by mouth daily.    .Marland Kitchenaspirin EC 81 MG tablet Take 81 mg by mouth daily.    .Marland Kitchenatorvastatin (LIPITOR) 10 MG tablet Take 10 mg by mouth at bedtime.    . bimatoprost (LUMIGAN) 0.01 % SOLN Place 1 drop into both eyes at bedtime.     . Calcium Carb-Cholecalciferol (CALCIUM 600+D3) 600-800 MG-UNIT TABS Take 1 tablet by mouth 2 (two) times daily.    . cholecalciferol (VITAMIN D) 1000 units tablet Take 1,000 Units by mouth daily.    . cyanocobalamin 500 MCG tablet Take 500 mcg by mouth daily.    .Marland Kitchen  escitalopram (LEXAPRO) 20 MG tablet Take 20 mg by mouth daily.    Marland Kitchen gabapentin (NEURONTIN) 300 MG capsule Take 2 capsules (600 mg total) by mouth at bedtime. 180 capsule 3  . HYDROcodone-acetaminophen (NORCO/VICODIN) 5-325 MG tablet Take 1 tablet by mouth every 6 (six) hours as needed for moderate pain.     Marland Kitchen letrozole (FEMARA) 2.5 MG tablet Take 1 tablet (2.5 mg total) by mouth daily. 30 tablet 11  . meclizine (ANTIVERT) 25 MG tablet Take 0.5 tablets (12.5 mg total) by mouth 3 (three) times daily as needed for dizziness. 15 tablet 0  . metoprolol tartrate (LOPRESSOR) 25 MG tablet Take 12.5 mg by mouth 2 (two) times daily.    . Multiple Vitamin (MULTIVITAMIN WITH MINERALS) TABS tablet Take 1 tablet by mouth daily.    Marland Kitchen omega-3 acid ethyl esters (LOVAZA) 1 g capsule Take 3 g by mouth daily.     Marland Kitchen omeprazole  (PRILOSEC) 20 MG capsule Take 20 mg by mouth 2 (two) times daily.    Marland Kitchen pyridoxine (B-6) 100 MG tablet Take 100 mg by mouth daily.    . vitamin C (ASCORBIC ACID) 500 MG tablet Take 1,000 mg by mouth daily.    . vitamin E 400 UNIT capsule Take 400 Units by mouth daily.     No current facility-administered medications for this visit.    Review of Systems Review of Systems  Constitutional: Negative.   Respiratory: Negative.   Cardiovascular: Negative.     Blood pressure 130/78, pulse 78, resp. rate 14, height '5\' 7"'  (1.702 m), weight 222 lb (100.699 kg).  Physical Exam Physical Exam  Constitutional: She is oriented to person, place, and time. She appears well-developed and well-nourished.  Eyes: Conjunctivae are normal. No scleral icterus.  Neck: Neck supple. No thyromegaly present.  Cardiovascular: Normal rate, regular rhythm and normal heart sounds.   Pulmonary/Chest: Effort normal. Right breast exhibits no inverted nipple, no mass, no nipple discharge, no skin change and no tenderness. Left breast exhibits no inverted nipple, no mass, no nipple discharge, no skin change and no tenderness.    Musculoskeletal:       Right shoulder: Spasms: .       Arms: Upper extremity measurements at a location 15 cm superior to the olecranon process as well as 10 and 20 cm below the olecranon process were obtained.  Right: 39, 29, 22 cm respectively.  Left: 38, 30, 21 cm respectively.   No clinically notable asymmetry    Lymphadenopathy:    She has no cervical adenopathy.  Neurological: She is alert and oriented to person, place, and time.  Skin: Skin is warm and dry.       Assessment    Doing well status post management of a T1bN0  29m; ER100%, PR 90%, HER-2/neu not overexpressed. Tolerating letrozole therapy well.     Plan          3 month follow up  This information has been scribed by RVerlene Mayer CMA     PCP: Dr. MIlla Level JForest Gleason5/04/2016,  3:56 PM

## 2015-12-14 ENCOUNTER — Encounter: Payer: Self-pay | Admitting: General Surgery

## 2015-12-23 ENCOUNTER — Ambulatory Visit
Admission: RE | Admit: 2015-12-23 | Discharge: 2015-12-23 | Disposition: A | Discharge status: Home / Self Care | Payer: Commercial Managed Care - HMO | Source: Ambulatory Visit | Attending: Radiation Oncology | Admitting: Radiation Oncology

## 2015-12-23 ENCOUNTER — Encounter: Payer: Self-pay | Admitting: Radiation Oncology

## 2015-12-23 VITALS — BP 124/82 | HR 74 | Temp 97.0°F | Ht 67.0 in | Wt 219.7 lb

## 2015-12-23 DIAGNOSIS — D0591 Unspecified type of carcinoma in situ of right breast: Secondary | ICD-10-CM

## 2015-12-23 NOTE — Progress Notes (Signed)
Radiation Oncology Follow up Note  Name: Leah Schaefer   Date:   12/23/2015 MRN:  [id number] DOB: Jan 14, 1941    This 75 y.o. female presents to the clinic today for follow-up for breast cancer status post bilateral radiation therapy now out 1 month completing right breast treatment.  REFERRING PROVIDER: Margarita Rana, MD  HPI: Patient is a pleasant 75 year old female have included radiation therapy to her left breast for triple negative stage IIa breast cancer back in 2013. She's now out 1 month completing completion accelerated partial breast irradiation radiation therapy to her right breast for stage I (T1 be N0 M0) ER/PR positive HER-2/neu negative invasive mammary carcinoma. She is seen today. And is doing well. She's had a's episode of dizziness and has is being worked up by her primary care doctor for that. She's currently on letrozole following that well she is seeing medical oncology today.  COMPLICATIONS OF TREATMENT: none  FOLLOW UP COMPLIANCE: keeps appointments showing no pathology  PHYSICAL EXAM:  BP 124/82 mmHg  Pulse 74  Temp(Src) 97 F (36.1 C) (Tympanic)  Ht _0  (1.702 m)  Wt 219 lb 11 oz (99.65 kg)  BMI 34.40 kg/m2 Lungs are clear to A&P cardiac examination essentially unremarkable with regular rate and rhythm. No dominant mass or nodularity is noted in either breast in 2 positions examined. Incision is well-healed. No axillary or supraclavicular adenopathy is appreciated. Cosmetic result is excellent. Well-developed well-nourished patient in NAD. HEENT reveals PERLA, EOMI, discs not visualized.  Oral cavity is clear. No oral mucosal lesions are identified. Neck is clear without evidence of cervical or supraclavicular adenopathy. Lungs are clear to A&P. Cardiac examination is essentially unremarkable with regular rate and rhythm without murmur rub or thrill. Abdomen is benign with no organomegaly or masses noted. Motor sensory and DTR levels are equal and symmetric in  the upper and lower extremities. Cranial nerves II through XII are grossly intact. Proprioception is intact. No peripheral adenopathy or edema is identified. No motor or sensory levels are noted. Crude visual fields are within normal range.  RADIOLOGY RESULTS: CT the head from March is reviewed showing no overt pathology some white matter changes noted.  PLAN: Present time from a breast and point she is doing well 1 month out. I'm please were overall progress. She'll see medical oncology today. I have asked to see her back in 4-5 months for follow-up. She continues on letrozole without side effect. Patient knows to call sooner with any concerns.  I would like to take this opportunity for allowing me to participate in the care of your patient.Armstead Peaks., MD

## 2015-12-24 ENCOUNTER — Other Ambulatory Visit: Payer: Self-pay | Admitting: *Deleted

## 2015-12-24 ENCOUNTER — Inpatient Hospital Stay: Payer: Commercial Managed Care - HMO | Attending: Internal Medicine

## 2015-12-24 ENCOUNTER — Inpatient Hospital Stay (HOSPITAL_BASED_OUTPATIENT_CLINIC_OR_DEPARTMENT_OTHER): Payer: Commercial Managed Care - HMO | Admitting: Internal Medicine

## 2015-12-24 ENCOUNTER — Encounter: Payer: Self-pay | Admitting: Internal Medicine

## 2015-12-24 ENCOUNTER — Ambulatory Visit: Payer: Commercial Managed Care - HMO | Admitting: Radiation Oncology

## 2015-12-24 VITALS — BP 149/88 | HR 79 | Temp 97.0°F | Resp 18 | Wt 223.1 lb

## 2015-12-24 DIAGNOSIS — I10 Essential (primary) hypertension: Secondary | ICD-10-CM | POA: Diagnosis not present

## 2015-12-24 DIAGNOSIS — Z923 Personal history of irradiation: Secondary | ICD-10-CM | POA: Diagnosis not present

## 2015-12-24 DIAGNOSIS — Z170 Estrogen receptor positive status [ER+]: Secondary | ICD-10-CM | POA: Diagnosis not present

## 2015-12-24 DIAGNOSIS — C50211 Malignant neoplasm of upper-inner quadrant of right female breast: Secondary | ICD-10-CM | POA: Insufficient documentation

## 2015-12-24 DIAGNOSIS — Z807 Family history of other malignant neoplasms of lymphoid, hematopoietic and related tissues: Secondary | ICD-10-CM | POA: Diagnosis not present

## 2015-12-24 DIAGNOSIS — Z79811 Long term (current) use of aromatase inhibitors: Secondary | ICD-10-CM | POA: Diagnosis not present

## 2015-12-24 DIAGNOSIS — E119 Type 2 diabetes mellitus without complications: Secondary | ICD-10-CM

## 2015-12-24 DIAGNOSIS — Z8041 Family history of malignant neoplasm of ovary: Secondary | ICD-10-CM | POA: Insufficient documentation

## 2015-12-24 DIAGNOSIS — Z7982 Long term (current) use of aspirin: Secondary | ICD-10-CM

## 2015-12-24 DIAGNOSIS — C50412 Malignant neoplasm of upper-outer quadrant of left female breast: Secondary | ICD-10-CM

## 2015-12-24 DIAGNOSIS — K469 Unspecified abdominal hernia without obstruction or gangrene: Secondary | ICD-10-CM | POA: Insufficient documentation

## 2015-12-24 DIAGNOSIS — Z79899 Other long term (current) drug therapy: Secondary | ICD-10-CM | POA: Insufficient documentation

## 2015-12-24 DIAGNOSIS — Z853 Personal history of malignant neoplasm of breast: Secondary | ICD-10-CM

## 2015-12-24 LAB — COMPREHENSIVE METABOLIC PANEL
ALK PHOS: 95 U/L (ref 38–126)
ALT: 26 U/L (ref 14–54)
ANION GAP: 6 (ref 5–15)
AST: 30 U/L (ref 15–41)
Albumin: 3.9 g/dL (ref 3.5–5.0)
BILIRUBIN TOTAL: 0.4 mg/dL (ref 0.3–1.2)
BUN: 8 mg/dL (ref 6–20)
CALCIUM: 9.1 mg/dL (ref 8.9–10.3)
CO2: 26 mmol/L (ref 22–32)
CREATININE: 0.77 mg/dL (ref 0.44–1.00)
Chloride: 104 mmol/L (ref 101–111)
Glucose, Bld: 211 mg/dL — ABNORMAL HIGH (ref 65–99)
Potassium: 3.9 mmol/L (ref 3.5–5.1)
Sodium: 136 mmol/L (ref 135–145)
TOTAL PROTEIN: 7.4 g/dL (ref 6.5–8.1)

## 2015-12-24 LAB — CBC WITH DIFFERENTIAL/PLATELET
Basophils Absolute: 0.1 10*3/uL (ref 0–0.1)
Basophils Relative: 1 %
Eosinophils Absolute: 0.2 10*3/uL (ref 0–0.7)
Eosinophils Relative: 3 %
HEMATOCRIT: 44.2 % (ref 35.0–47.0)
HEMOGLOBIN: 15.3 g/dL (ref 12.0–16.0)
LYMPHS PCT: 24 %
Lymphs Abs: 1.1 10*3/uL (ref 1.0–3.6)
MCH: 30.9 pg (ref 26.0–34.0)
MCHC: 34.5 g/dL (ref 32.0–36.0)
MCV: 89.6 fL (ref 80.0–100.0)
MONOS PCT: 7 %
Monocytes Absolute: 0.3 10*3/uL (ref 0.2–0.9)
NEUTROS ABS: 3.1 10*3/uL (ref 1.4–6.5)
NEUTROS PCT: 65 %
Platelets: 224 10*3/uL (ref 150–440)
RBC: 4.94 MIL/uL (ref 3.80–5.20)
RDW: 12.8 % (ref 11.5–14.5)
WBC: 4.7 10*3/uL (ref 3.6–11.0)

## 2015-12-24 NOTE — Progress Notes (Signed)
Former patient of Dr. Ma Hillock. Patient has left breast cancer 2+ years ago.  She has recently been diagnosed with right breast cancer.  Dr. Bary Castilla performed surgery and patient has been placed on Femara.  States she is tolerating it well.

## 2015-12-24 NOTE — Progress Notes (Signed)
[facility] OFFICE PROGRESS NOTE  Patient Care Team: Margarita Rana, MD as PCP - General (Family Medicine) Robert Bellow, MD (General Surgery) Margarita Rana, MD as Referring Physician (Family Medicine)   SUMMARY OF HEMATOLOGIC/ONCOLOGIC HISTORY:  # FEB 2017- RIGHT BREAST  STAGE I [T1b N0 M0]. ER/PR ER positive 100% PR positive 95% HER 2 FISH negative;  INVASIVE MAMMARY CARCINOMA WITH MICROPAPILLARY FEATURES. S/p Lumpec [Dr.Byrnett] & RT; No Oncotype; Femara  # 2013- LEFT BREAST- STAGE II; ER/PR/ Her 2 NEG s/p Lumpec [Dr.Byrnett] & RT  # BMD- April  2017- wnl; genetic testing- awaiting to make decision [May 2017]  # Vertigo [uses rolling walker]  INTERVAL HISTORY:  This is my first interaction with the patient since I joined the practice September 2016. I reviewed the patient's prior charts/pertinent labs/imaging in detail; findings are summarized above.  A very pleasant 75 year old African-American female patient with above history of breast cancer most recently diagnosed in February 2017 is here for follow-up. Patient finished radiation approximately a month ago.  Her appetite is good. Denies any weight loss. No chest pain or shortness of breath or cough. Denies any significant skin rash from the radiation. She has chronic mild muscle pain joint pains not any worse. Chronic mild hot flashes. Not any worse.    REVIEW OF SYSTEMS:  A complete 10 point review of system is done which is negative except mentioned above/history of present illness.   PAST MEDICAL HISTORY :  Past Medical History  Diagnosis Date  . Hernia 2011  . Cystitis   . 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 Personal history of malignant neoplasm of breast   . Hypertension   . GERD (gastroesophageal reflux disease)   . Diabetes mellitus without complication (HCC)     NO MEDS-DIET CONTROLLED  . Malignant neoplasm of upper-inner quadrant of female breast ([place])  08/17/2011    Left, T2 (2.3 cm) N0, triple negative. Chemotherapy/post wide excision whole breast radiation.  . Breast cancer ([place])   . Breast cancer of upper-inner quadrant of right female breast ([place]) 09/21/2015    T1bN0 " 53m; ER100%, PR 90%, HER-2/neu not overexpressed.    PAST SURGICAL HISTORY :   Past Surgical History  Procedure Laterality Date  . Colonoscopy  2011    Dr OCandace Cruise . Hernia repair Right 2009    inguinal, and umbilical  . Cholecystectomy  2007  . Dilation and curettage of uterus  2004  . Portacath placement  2013  . Upper gi endoscopy  2011  . Breast surgery Left 2012    wide excision  . Breast surgery Left November 2013    Core biopsy of the upper-outer quadrant showed fat necrosis.  . Colonoscopy with propofol N/A 02/25/2015    Procedure: COLONOSCOPY WITH PROPOFOL;  Surgeon: PHulen Luster MD;  [facility];  Service: Gastroenterology;  Laterality: N/A;  . Breast biopsy Left 2013  . Port-a-cath removal    . Breast lumpectomy with sentinel lymph node biopsy Right 09/30/2015    Procedure: BREAST LUMPECTOMY WITH SENTINEL LYMPH NODE BX;  Surgeon: JRobert Bellow MD;  Location: [facility] ORS;  Service: General;  Laterality: Right;    FAMILY HISTORY :   Family History  Problem Relation Age of Onset  . Ovarian cancer Sister   . Lymphoma Father   . Colon cancer Brother   . Lymphoma Brother   . Breast cancer Neg Hx  SOCIAL HISTORY:   Social History  Substance Use Topics  . Smoking status: Never Smoker   . Smokeless tobacco: Never Used  . Alcohol Use: No    ALLERGIES:  has No Known Allergies.  MEDICATIONS:  Current Outpatient Prescriptions  Medication Sig Dispense Refill  . acetaminophen (TYLENOL) 500 MG tablet Take 500 mg by mouth every 6 (six) hours as needed for mild pain or headache.     Marland Kitchen acyclovir (ZOVIRAX) 200 MG capsule Take 200 mg by mouth daily.    Marland Kitchen aspirin EC 81 MG tablet Take 81 mg by mouth daily.    Marland Kitchen atorvastatin (LIPITOR) 10 MG  tablet Take 10 mg by mouth at bedtime.    . bimatoprost (LUMIGAN) 0.01 % SOLN Place 1 drop into both eyes at bedtime.     . Calcium Carb-Cholecalciferol (CALCIUM 600+D3) 600-800 MG-UNIT TABS Take 1 tablet by mouth 2 (two) times daily.    . cholecalciferol (VITAMIN D) 1000 units tablet Take 1,000 Units by mouth daily.    . cyanocobalamin 500 MCG tablet Take 500 mcg by mouth daily.    Marland Kitchen escitalopram (LEXAPRO) 20 MG tablet Take 20 mg by mouth daily.    Marland Kitchen gabapentin (NEURONTIN) 300 MG capsule Take 2 capsules (600 mg total) by mouth at bedtime. 180 capsule 3  . HYDROcodone-acetaminophen (NORCO/VICODIN) 5-325 MG tablet Take 1 tablet by mouth every 6 (six) hours as needed for moderate pain.     Marland Kitchen letrozole (FEMARA) 2.5 MG tablet Take 1 tablet (2.5 mg total) by mouth daily. 30 tablet 11  . meclizine (ANTIVERT) 25 MG tablet Take 0.5 tablets (12.5 mg total) by mouth 3 (three) times daily as needed for dizziness. 15 tablet 0  . metoprolol tartrate (LOPRESSOR) 25 MG tablet Take 12.5 mg by mouth 2 (two) times daily.    . Multiple Vitamin (MULTIVITAMIN WITH MINERALS) TABS tablet Take 1 tablet by mouth daily.    Marland Kitchen omega-3 acid ethyl esters (LOVAZA) 1 g capsule Take 3 g by mouth daily.     Marland Kitchen omeprazole (PRILOSEC) 20 MG capsule Take 20 mg by mouth 2 (two) times daily.    Marland Kitchen pyridoxine (B-6) 100 MG tablet Take 100 mg by mouth daily.    . vitamin C (ASCORBIC ACID) 500 MG tablet Take 1,000 mg by mouth daily.    . vitamin E 400 UNIT capsule Take 400 Units by mouth daily.    . meloxicam (MOBIC) 15 MG tablet      No current facility-administered medications for this visit.    PHYSICAL EXAMINATION: ECOG PERFORMANCE STATUS: 0 - Asymptomatic  BP 149/88 mmHg  Pulse 79  Temp(Src) 97 F (36.1 C) (Tympanic)  Resp 18  Wt 223 lb 1.7 oz (101.2 kg)  Filed Weights   12/24/15 1050  Weight: 223 lb 1.7 oz (101.2 kg)    GENERAL: Well-nourished well-developed; Alert, no distress and comfortable.   Alone.walks with a  walker.   EYES: no pallor or icterus OROPHARYNX: no thrush or ulceration; good dentition  NECK: supple, no masses felt LYMPH:  no palpable lymphadenopathy in the cervical, axillary or inguinal regions LUNGS: clear to auscultation and  No wheeze or crackles HEART/CVS: regular rate & rhythm and no murmurs; No lower extremity edema ABDOMEN:abdomen soft, non-tender and normal bowel sounds Musculoskeletal:no cyanosis of digits and no clubbing  PSYCH: alert & oriented x 3 with fluent speech NEURO: no focal motor/sensory deficits SKIN:  no rashes or significant lesions Right and left BREAST exam [in the presence of  nurse]- no unusual skin changes or dominant masses felt. Surgical scars noted.    LABORATORY DATA:  I have reviewed the data as listed    Component Value Date/Time   NA 138 10/27/2015 1815   NA 142 04/05/2015 0857   NA 140 12/23/2012 0949   K 3.9 10/27/2015 1815   K 4.7 12/23/2012 0949   CL 105 10/27/2015 1815   CL 101 12/23/2012 0949   CO2 28 10/27/2015 1815   CO2 30 12/23/2012 0949   GLUCOSE 140* 10/27/2015 1815   GLUCOSE 155* 04/05/2015 0857   GLUCOSE 168* 12/23/2012 0949   BUN 9 10/27/2015 1815   BUN 11 04/05/2015 0857   BUN 9 12/23/2012 0949   CREATININE 0.75 10/27/2015 1815   CREATININE 0.92 09/04/2014 0916   CALCIUM 9.2 10/27/2015 1815   CALCIUM 9.8 12/23/2012 0949   PROT 7.0 10/27/2015 1815   PROT 7.2 04/05/2015 0857   PROT 7.4 09/04/2014 0916   ALBUMIN 3.8 10/27/2015 1815   ALBUMIN 4.3 04/05/2015 0857   ALBUMIN 3.7 09/04/2014 0916   AST 26 10/27/2015 1815   AST 21 09/04/2014 0916   ALT 22 10/27/2015 1815   ALT 34 09/04/2014 0916   ALKPHOS 85 10/27/2015 1815   ALKPHOS 85 09/04/2014 0916   BILITOT 0.6 10/27/2015 1815   BILITOT 0.3 04/05/2015 0857   BILITOT 0.4 09/04/2014 0916   GFRNONAA >60 10/27/2015 1815   GFRNONAA >60 09/04/2014 0916   GFRNONAA >60 02/18/2014 1048   GFRAA >60 10/27/2015 1815   GFRAA >60 09/04/2014 0916   GFRAA >60 02/18/2014  1048    No results found for: SPEP, UPEP  Lab Results  Component Value Date   WBC 4.7 12/24/2015   NEUTROABS 3.1 12/24/2015   HGB 15.3 12/24/2015   HCT 44.2 12/24/2015   MCV 89.6 12/24/2015   PLT 224 12/24/2015      Chemistry      Component Value Date/Time   NA 138 10/27/2015 1815   NA 142 04/05/2015 0857   NA 140 12/23/2012 0949   K 3.9 10/27/2015 1815   K 4.7 12/23/2012 0949   CL 105 10/27/2015 1815   CL 101 12/23/2012 0949   CO2 28 10/27/2015 1815   CO2 30 12/23/2012 0949   BUN 9 10/27/2015 1815   BUN 11 04/05/2015 0857   BUN 9 12/23/2012 0949   CREATININE 0.75 10/27/2015 1815   CREATININE 0.92 09/04/2014 0916      Component Value Date/Time   CALCIUM 9.2 10/27/2015 1815   CALCIUM 9.8 12/23/2012 0949   ALKPHOS 85 10/27/2015 1815   ALKPHOS 85 09/04/2014 0916   AST 26 10/27/2015 1815   AST 21 09/04/2014 0916   ALT 22 10/27/2015 1815   ALT 34 09/04/2014 0916   BILITOT 0.6 10/27/2015 1815   BILITOT 0.3 04/05/2015 0857   BILITOT 0.4 09/04/2014 0916        ASSESSMENT & PLAN:   # 2017 right breast cancer stage I ER/PR positive HER-2/neu negative status post lumpectomy. No chemotherapy. Status post adjuvant radiation. On Femara. Tolerating well without any major side effects.  # Given the history of bilateral breast cancer- I reviewed the possibility of genetic predisposition; recommend checking- BRCA testing. Counseled the patient- regarding the implications of a positive testing for herself and for her family Marlana Salvage has grownup son and daughter]. Patient wants to wait before making a decision regarding the testing at this time.  # Bone density test April 2017 within normal limits. Continue calcium and  vitamin D.  All questions were answered. The patient knows to call the clinic with any problems, questions or concerns.  Follows up in 3 months/ no labs.   # 25 minutes face-to-face with the patient discussing the above plan of care; more than 50% of time spent on  prognosis/ natural history; counseling and coordination.    Cammie Sickle, MD 12/24/2015 11:03 AM

## 2015-12-24 NOTE — Progress Notes (Signed)
RN Chaperoned provider with Breast Exam.   

## 2015-12-25 LAB — CA 27.29 (SERIAL MONITOR): CA 27.29: 20.4 U/mL (ref 0.0–38.6)

## 2016-01-05 ENCOUNTER — Telehealth: Payer: Self-pay | Admitting: Family Medicine

## 2016-01-05 ENCOUNTER — Ambulatory Visit (INDEPENDENT_AMBULATORY_CARE_PROVIDER_SITE_OTHER): Payer: Commercial Managed Care - HMO | Admitting: Family Medicine

## 2016-01-05 ENCOUNTER — Encounter: Payer: Self-pay | Admitting: Family Medicine

## 2016-01-05 VITALS — BP 126/84 | HR 66 | Temp 98.6°F | Resp 16 | Wt 224.8 lb

## 2016-01-05 DIAGNOSIS — I10 Essential (primary) hypertension: Secondary | ICD-10-CM

## 2016-01-05 DIAGNOSIS — E119 Type 2 diabetes mellitus without complications: Secondary | ICD-10-CM

## 2016-01-05 NOTE — Progress Notes (Signed)
Patient: Leah Schaefer Female    DOB: 01/21/1941   75 y.o.   MRN: [id number] Visit Date: 01/05/2016  Today's Provider: Margarita Rana, MD   Chief Complaint  Patient presents with  . Hypertension  . Dizziness   Subjective:    HPI Patient is present in office today to follow up for hypertension, last office visit was 10/28/15, patient reports good compliance and tolerance on medication. Patient reports that she checks her blood pressure on a weekly basis, systolic readings between 0000000 and dystolic readings have been remaining between 80-90.    Hypertension, follow-up:  BP Readings from Last 3 Encounters:  01/05/16 126/84  12/24/15 149/88  12/23/15 124/82    She was last seen for hypertension 3 months ago.  Management changes since that visit include none. She reports excellent compliance with treatment. She is not having side effects.  She is exercising. She is adherent to low salt diet, patient reports that she uses sea salt Outside blood pressures are stable.   She is experiencing fatigue.  Patient denies symptoms   Cardiovascular risk factors include advanced age (older than 29 for men, 55 for women) and hypertension.  Use of agents associated with hypertension: none.     Weight trend: fluctuating a bit Wt Readings from Last 3 Encounters:  01/05/16 224 lb 12.8 oz (101.969 kg)  12/24/15 223 lb 1.7 oz (101.2 kg)  12/23/15 219 lb 11 oz (99.65 kg)    Current diet: in general, a "healthy" diet    ------------------------------------------------------------------------   Vertigo:Patient was last seen in office on 10/28/15 with concerns of dizziness, she was referred to ENT but patient states today she did not go. Patient reports she has had dizzy spells but are less frequent than before. Patient states she believes dizziness is contributed to her standing quickly    No Known Allergies Previous Medications   ACETAMINOPHEN (TYLENOL) 500 MG TABLET    Take 500  mg by mouth every 6 (six) hours as needed for mild pain or headache.    ACYCLOVIR (ZOVIRAX) 200 MG CAPSULE    Take 200 mg by mouth daily.   ASPIRIN EC 81 MG TABLET    Take 81 mg by mouth daily.   ATORVASTATIN (LIPITOR) 10 MG TABLET    Take 10 mg by mouth at bedtime.   BIMATOPROST (LUMIGAN) 0.01 % SOLN    Place 1 drop into both eyes at bedtime.    CALCIUM CARB-CHOLECALCIFEROL (CALCIUM 600+D3) 600-800 MG-UNIT TABS    Take 1 tablet by mouth 2 (two) times daily.   CHOLECALCIFEROL (VITAMIN D) 1000 UNITS TABLET    Take 1,000 Units by mouth daily.   CYANOCOBALAMIN 500 MCG TABLET    Take 500 mcg by mouth daily.   ESCITALOPRAM (LEXAPRO) 20 MG TABLET    Take 20 mg by mouth daily.   GABAPENTIN (NEURONTIN) 300 MG CAPSULE    Take 2 capsules (600 mg total) by mouth at bedtime.   LETROZOLE (FEMARA) 2.5 MG TABLET    Take 1 tablet (2.5 mg total) by mouth daily.   MECLIZINE (ANTIVERT) 25 MG TABLET    Take 0.5 tablets (12.5 mg total) by mouth 3 (three) times daily as needed for dizziness.   MELOXICAM (MOBIC) 15 MG TABLET       METOPROLOL TARTRATE (LOPRESSOR) 25 MG TABLET    Take 12.5 mg by mouth 2 (two) times daily.   MULTIPLE VITAMIN (MULTIVITAMIN WITH MINERALS) TABS TABLET    Take 1  tablet by mouth daily.   OMEGA-3 ACID ETHYL ESTERS (LOVAZA) 1 G CAPSULE    Take 3 g by mouth daily.    OMEPRAZOLE (PRILOSEC) 20 MG CAPSULE    Take 20 mg by mouth 2 (two) times daily.   PYRIDOXINE (B-6) 100 MG TABLET    Take 100 mg by mouth daily.   VITAMIN C (ASCORBIC ACID) 500 MG TABLET    Take 1,000 mg by mouth daily.   VITAMIN E 400 UNIT CAPSULE    Take 400 Units by mouth daily.    Review of Systems  Constitutional: Positive for fatigue. Negative for fever, chills, diaphoresis, activity change, appetite change and unexpected weight change.  HENT: Positive for rhinorrhea.   Eyes: Negative.  Negative for photophobia, pain, discharge, redness, itching and visual disturbance.  Respiratory: Negative.   Cardiovascular: Negative.     Gastrointestinal: Negative.   Endocrine: Negative.   Genitourinary: Negative.   Musculoskeletal: Negative.   Allergic/Immunologic: Positive for environmental allergies. Negative for food allergies and immunocompromised state.  Neurological: Negative.   Hematological: Negative.   Psychiatric/Behavioral: Negative.     Social History  Substance Use Topics  . Smoking status: Never Smoker   . Smokeless tobacco: Never Used  . Alcohol Use: No   Objective:   BP 126/84 mmHg  Pulse 66  Temp(Src) 98.6 F (37 C) (Oral)  Resp 16  Wt 224 lb 12.8 oz (101.969 kg)  Physical Exam  Constitutional: She is oriented to person, place, and time. She appears well-developed and well-nourished.  Cardiovascular: Normal rate and regular rhythm.   Pulmonary/Chest: Effort normal and breath sounds normal.  Neurological: She is alert and oriented to person, place, and time.  Psychiatric: She has a normal mood and affect. Her behavior is normal. Judgment and thought content normal.      Assessment & Plan:      1. Essential (primary) hypertension Condition is stable. Please continue current medication and  plan of care as noted.  Will follow up with Dr. Rosanna Randy.     . Patient seen and examined by Dr. Jerrell Belfast, and note scribed by Jennings Books, [place].  I have reviewed the document for accuracy and completeness and I agree with above. - Jerrell Belfast, MD   Margarita Rana, MD  [facility]

## 2016-01-05 NOTE — Telephone Encounter (Signed)
Patient is due for blood sugar check and was not done at ov. Please call and have her go to lab for HgbA1c.  Thanks.

## 2016-01-06 ENCOUNTER — Ambulatory Visit (INDEPENDENT_AMBULATORY_CARE_PROVIDER_SITE_OTHER): Payer: Commercial Managed Care - HMO | Admitting: Family Medicine

## 2016-01-06 DIAGNOSIS — E119 Type 2 diabetes mellitus without complications: Secondary | ICD-10-CM

## 2016-01-06 LAB — POCT GLYCOSYLATED HEMOGLOBIN (HGB A1C): HEMOGLOBIN A1C: 7.6

## 2016-01-06 NOTE — Telephone Encounter (Signed)
Printed slip and advised pt to have this done. Renaldo Fiddler, CMA

## 2016-01-07 ENCOUNTER — Encounter: Payer: Self-pay | Admitting: Family Medicine

## 2016-01-07 MED ORDER — METFORMIN HCL 500 MG PO TABS: 500.0000 mg | ORAL_TABLET | Freq: Two times a day (BID) | ORAL | Status: DC

## 2016-01-07 NOTE — Progress Notes (Signed)
1. Controlled type 2 diabetes mellitus without complication, without long-term current use of insulin ([place]) Not controlled. Will start Metformin and recheck in 3 months with new provider.    - POCT glycosylated hemoglobin (Hb A1C) Results for orders placed or performed in visit on 01/06/16  POCT glycosylated hemoglobin (Hb A1C)  Result Value Ref Range   Hemoglobin A1C 7.6    Margarita Rana, MD

## 2016-01-25 ENCOUNTER — Encounter: Payer: Self-pay | Admitting: Family Medicine

## 2016-01-25 ENCOUNTER — Ambulatory Visit (INDEPENDENT_AMBULATORY_CARE_PROVIDER_SITE_OTHER): Payer: Commercial Managed Care - HMO | Admitting: Family Medicine

## 2016-01-25 DIAGNOSIS — E119 Type 2 diabetes mellitus without complications: Secondary | ICD-10-CM

## 2016-01-25 NOTE — Progress Notes (Signed)
Patient: Leah Schaefer Female    DOB: 21-Oct-1940   76 y.o.   MRN: [id number] Visit Date: 01/25/2016  Today's Provider: Margarita Rana, MD   Chief Complaint  Patient presents with  . Diabetes    follow-up   Subjective:    HPI  Diabetes Mellitus Type II, Follow-up:   Lab Results  Component Value Date   HGBA1C 7.6 01/06/2016   HGBA1C 7.1 10/05/2015   HGBA1C 6.9 07/05/2015    Last seen for diabetes 2 weeks ago.  Management since then includes starting Metformin. She reports poor compliance with treatment. Patient does not want to start metformin. Patient is concerned about GI side effects. She is not having side effects. Current symptoms include nocturia and have been unchanged. Home blood sugar records: fasting range: 137 this morning  Episodes of hypoglycemia? no   Current Insulin Regimen: none Most Recent Eye Exam: over a year Weight trend: decreasing steadily Prior visit with dietician: no Current diet: in general, a "healthy" diet   Patient reports that she has discontinued drinking soda's and has increased her exercise. Current exercise: swimming and walking  Pertinent Labs:    Component Value Date/Time   CHOL 136 04/05/2015 0857   TRIG 103 04/05/2015 0857   HDL 41 04/05/2015 0857   LDLCALC 74 04/05/2015 0857   CREATININE 0.77 12/24/2015 1032   CREATININE 0.92 09/04/2014 0916   Wt Readings from Last 3 Encounters:  01/25/16 219 lb (99.338 kg)  01/05/16 224 lb 12.8 oz (101.969 kg)  12/24/15 223 lb 1.7 oz (101.2 kg)   ------------------------------------------------------------------------     No Known Allergies Current Meds  Medication Sig  . ACCU-CHEK FASTCLIX LANCETS MISC   . ACCU-CHEK SMARTVIEW test strip   . acetaminophen (TYLENOL) 500 MG tablet Take 500 mg by mouth every 6 (six) hours as needed for mild pain or headache.   Marland Kitchen acyclovir (ZOVIRAX) 200 MG capsule Take 200 mg by mouth daily.  . Alcohol Swabs (B-D SINGLE USE SWABS REGULAR)  PADS   . aspirin EC 81 MG tablet Take 81 mg by mouth daily.  Marland Kitchen atorvastatin (LIPITOR) 10 MG tablet Take 10 mg by mouth at bedtime.  . bimatoprost (LUMIGAN) 0.01 % SOLN Place 1 drop into both eyes at bedtime.   . Blood Glucose Calibration (ACCU-CHEK SMARTVIEW CONTROL) LIQD   . Calcium Carb-Cholecalciferol (CALCIUM 600+D3) 600-800 MG-UNIT TABS Take 1 tablet by mouth 2 (two) times daily.  . cholecalciferol (VITAMIN D) 1000 units tablet Take 1,000 Units by mouth daily.  . cyanocobalamin 500 MCG tablet Take 500 mcg by mouth daily.  Marland Kitchen escitalopram (LEXAPRO) 20 MG tablet Take 20 mg by mouth daily.  Marland Kitchen gabapentin (NEURONTIN) 300 MG capsule Take 2 capsules (600 mg total) by mouth at bedtime.  Marland Kitchen letrozole (FEMARA) 2.5 MG tablet Take 1 tablet (2.5 mg total) by mouth daily.  . meclizine (ANTIVERT) 25 MG tablet Take 0.5 tablets (12.5 mg total) by mouth 3 (three) times daily as needed for dizziness.  . meloxicam (MOBIC) 15 MG tablet   . metoprolol tartrate (LOPRESSOR) 25 MG tablet Take 12.5 mg by mouth 2 (two) times daily.  . Multiple Vitamin (MULTIVITAMIN WITH MINERALS) TABS tablet Take 1 tablet by mouth daily.  Marland Kitchen omega-3 acid ethyl esters (LOVAZA) 1 g capsule Take 3 g by mouth daily.   Marland Kitchen omeprazole (PRILOSEC) 20 MG capsule Take 20 mg by mouth 2 (two) times daily.  Marland Kitchen pyridoxine (B-6) 100 MG tablet Take 100 mg by  mouth daily.  . vitamin C (ASCORBIC ACID) 500 MG tablet Take 1,000 mg by mouth daily.  . vitamin E 400 UNIT capsule Take 400 Units by mouth daily.    Review of Systems  Constitutional: Negative.   Endocrine: Negative.     Social History  Substance Use Topics  . Smoking status: Never Smoker   . Smokeless tobacco: Never Used  . Alcohol Use: No   Objective:   BP 120/70 mmHg  Pulse 80  Temp(Src) 98.2 F (36.8 C) (Oral)  Resp 16  Wt 219 lb (99.338 kg)  Physical Exam  Constitutional: She is oriented to person, place, and time. She appears well-developed and well-nourished.    Neurological: She is alert and oriented to person, place, and time.  Psychiatric: She has a normal mood and affect. Her behavior is normal. Judgment and thought content normal.       Assessment & Plan:     1. Diabetes mellitus without complication (HCC) Stable. Patient does not want to start Metformin at this time. Patient is concerned about possible GI side effects. Patient reports that she will continue to work on diet and exercise, pt will follow-up in 3 months with Dr. Rosanna Randy. Patient reports that she will start Metformin if needed after follow-up visit.      Patient seen and examined by Dr. Jerrell Belfast, and note scribed by Philbert Riser. [place], CMA.  I have reviewed the document for accuracy and completeness and I agree with above. - Jerrell Belfast, MD   Margarita Rana, MD  [facility]

## 2016-02-22 ENCOUNTER — Other Ambulatory Visit: Payer: Self-pay | Admitting: Family Medicine

## 2016-02-22 DIAGNOSIS — E7800 Pure hypercholesterolemia, unspecified: Secondary | ICD-10-CM

## 2016-02-22 DIAGNOSIS — M545 Low back pain: Secondary | ICD-10-CM

## 2016-02-22 DIAGNOSIS — I10 Essential (primary) hypertension: Secondary | ICD-10-CM

## 2016-02-22 DIAGNOSIS — F419 Anxiety disorder, unspecified: Secondary | ICD-10-CM

## 2016-02-24 ENCOUNTER — Other Ambulatory Visit: Payer: Self-pay | Admitting: Family Medicine

## 2016-02-24 DIAGNOSIS — E7800 Pure hypercholesterolemia, unspecified: Secondary | ICD-10-CM

## 2016-02-24 DIAGNOSIS — F4322 Adjustment disorder with anxiety: Secondary | ICD-10-CM

## 2016-02-24 NOTE — Telephone Encounter (Signed)
Has appointment with you 04/05/2016. Renaldo Fiddler, CMA

## 2016-03-06 ENCOUNTER — Other Ambulatory Visit: Payer: Self-pay | Admitting: Family Medicine

## 2016-03-06 DIAGNOSIS — K219 Gastro-esophageal reflux disease without esophagitis: Secondary | ICD-10-CM

## 2016-03-24 ENCOUNTER — Inpatient Hospital Stay: Payer: Commercial Managed Care - HMO | Admitting: Internal Medicine

## 2016-03-24 ENCOUNTER — Other Ambulatory Visit: Payer: Commercial Managed Care - HMO

## 2016-03-29 ENCOUNTER — Other Ambulatory Visit: Payer: Self-pay | Admitting: Family Medicine

## 2016-03-29 DIAGNOSIS — E1140 Type 2 diabetes mellitus with diabetic neuropathy, unspecified: Secondary | ICD-10-CM

## 2016-03-29 NOTE — Telephone Encounter (Signed)
Has appointment with you 04/05/2016. Renaldo Fiddler, CMA

## 2016-03-30 ENCOUNTER — Encounter: Payer: Self-pay | Admitting: *Deleted

## 2016-04-05 ENCOUNTER — Encounter: Payer: Self-pay | Admitting: General Surgery

## 2016-04-05 ENCOUNTER — Encounter: Payer: Self-pay | Admitting: Family Medicine

## 2016-04-05 ENCOUNTER — Ambulatory Visit (INDEPENDENT_AMBULATORY_CARE_PROVIDER_SITE_OTHER): Payer: Commercial Managed Care - HMO | Admitting: Family Medicine

## 2016-04-05 ENCOUNTER — Ambulatory Visit (INDEPENDENT_AMBULATORY_CARE_PROVIDER_SITE_OTHER): Payer: Commercial Managed Care - HMO | Admitting: General Surgery

## 2016-04-05 ENCOUNTER — Ambulatory Visit: Payer: Self-pay

## 2016-04-05 VITALS — BP 142/84 | HR 62 | Temp 97.8°F | Wt 215.4 lb

## 2016-04-05 VITALS — BP 124/82 | HR 74 | Resp 12 | Ht 67.0 in | Wt 211.0 lb

## 2016-04-05 DIAGNOSIS — C50412 Malignant neoplasm of upper-outer quadrant of left female breast: Secondary | ICD-10-CM

## 2016-04-05 DIAGNOSIS — N641 Fat necrosis of breast: Secondary | ICD-10-CM | POA: Diagnosis not present

## 2016-04-05 DIAGNOSIS — E119 Type 2 diabetes mellitus without complications: Secondary | ICD-10-CM | POA: Diagnosis not present

## 2016-04-05 DIAGNOSIS — Z23 Encounter for immunization: Secondary | ICD-10-CM

## 2016-04-05 DIAGNOSIS — E7800 Pure hypercholesterolemia, unspecified: Secondary | ICD-10-CM

## 2016-04-05 DIAGNOSIS — C50211 Malignant neoplasm of upper-inner quadrant of right female breast: Secondary | ICD-10-CM | POA: Diagnosis not present

## 2016-04-05 DIAGNOSIS — N63 Unspecified lump in breast: Secondary | ICD-10-CM

## 2016-04-05 DIAGNOSIS — I10 Essential (primary) hypertension: Secondary | ICD-10-CM | POA: Diagnosis not present

## 2016-04-05 DIAGNOSIS — N6320 Unspecified lump in the left breast, unspecified quadrant: Secondary | ICD-10-CM

## 2016-04-05 HISTORY — PX: BREAST BIOPSY: SHX20

## 2016-04-05 NOTE — Progress Notes (Signed)
Patient: Leah Schaefer Female    DOB: 01/22/1941   75 y.o.   MRN: [id number] Visit Date: 04/05/2016  Today's Provider: [name], MD   Chief Complaint  Patient presents with  . Diabetes  . Hyperlipidemia  . Hypertension   Subjective:    HPI    Diabetes Mellitus Type II, Follow-up:   Lab Results  Component Value Date   HGBA1C 7.6 01/06/2016   HGBA1C 7.1 10/05/2015   HGBA1C 6.9 07/05/2015   Last seen for diabetes 3 months ago.  Management since then includes Work on diet and exercise; pt agreed to start Metformin if her A1C does not come down at this follow-up visit. She reports good compliance with treatment. She is not having side effects.  Current symptoms include none and have been stable. Home blood sugar records: fasting range: 140-180's  Episodes of hypoglycemia? no   Weight trend: stable Current diet: well balanced Current exercise: [employer]  ------------------------------------------------------------------------   Hypertension, follow-up:  BP Readings from Last 3 Encounters:  04/05/16 (!) 142/84  04/05/16 124/82  01/25/16 120/70    She was last seen for hypertension 3 months ago.  Management since that visit includes continue medications.She reports good compliance with treatment. She is not having side effects.  She is exercising. She is adherent to low salt diet.   Outside blood pressures are not being checked. She is experiencing none.  Patient denies none.   Cardiovascular risk factors include advanced age (older than 57 for men, 31 for women), diabetes mellitus, dyslipidemia, hypertension and obesity (BMI >= 30 kg/m2).  ------------------------------------------------------------------------    Lipid/Cholesterol, Follow-up:   Last seen for this 3 months ago.  Management since that visit includes continue Atorvastatin .  Last Lipid Panel:    Component Value Date/Time   CHOL 136 04/05/2015 0857   TRIG 103 04/05/2015  0857   HDL 41 04/05/2015 0857   CHOLHDL 3.3 04/05/2015 0857   LDLCALC 74 04/05/2015 0857    She reports good compliance with treatment. She is not having side effects.   Wt Readings from Last 3 Encounters:  04/05/16 215 lb 6.4 oz (97.7 kg)  04/05/16 211 lb (95.7 kg)  01/25/16 219 lb (99.3 kg)    ------------------------------------------------------------------------       No Known Allergies Current Meds  Medication Sig  . ACCU-CHEK FASTCLIX LANCETS MISC   . ACCU-CHEK SMARTVIEW test strip   . acetaminophen (TYLENOL) 500 MG tablet Take 500 mg by mouth every 6 (six) hours as needed for mild pain or headache.   Marland Kitchen acyclovir (ZOVIRAX) 200 MG capsule TAKE 1 CAPSULE EVERY DAY  . Alcohol Swabs (B-D SINGLE USE SWABS REGULAR) PADS   . aspirin EC 81 MG tablet Take 81 mg by mouth daily.  Marland Kitchen atorvastatin (LIPITOR) 10 MG tablet TAKE 1 TABLET EVERY EVENING  . bimatoprost (LUMIGAN) 0.01 % SOLN Place 1 drop into both eyes at bedtime.   . Blood Glucose Calibration (ACCU-CHEK SMARTVIEW CONTROL) LIQD   . Calcium Carb-Cholecalciferol (CALCIUM 600+D3) 600-800 MG-UNIT TABS Take 1 tablet by mouth 2 (two) times daily.  . cholecalciferol (VITAMIN D) 1000 units tablet Take 1,000 Units by mouth daily.  . cyanocobalamin 500 MCG tablet Take 500 mcg by mouth daily.  Marland Kitchen escitalopram (LEXAPRO) 20 MG tablet TAKE 1 TABLET EVERY DAY  . gabapentin (NEURONTIN) 300 MG capsule TAKE 2 CAPSULES AT BEDTIME  . letrozole (FEMARA) 2.5 MG tablet Take 1 tablet (2.5 mg total) by mouth daily.  Marland Kitchen  meclizine (ANTIVERT) 25 MG tablet Take 0.5 tablets (12.5 mg total) by mouth 3 (three) times daily as needed for dizziness.  . meloxicam (MOBIC) 15 MG tablet TAKE 1 TABLET EVERY DAY  . metoprolol tartrate (LOPRESSOR) 25 MG tablet TAKE 1/2 TABLET TWICE DAILY  . Multiple Vitamin (MULTIVITAMIN WITH MINERALS) TABS tablet Take 1 tablet by mouth daily.  Marland Kitchen omega-3 acid ethyl esters (LOVAZA) 1 g capsule Take 3 g by mouth daily.   Marland Kitchen  omeprazole (PRILOSEC) 20 MG capsule TAKE 1 CAPSULE TWICE DAILY  . pyridoxine (B-6) 100 MG tablet Take 100 mg by mouth daily.  . vitamin C (ASCORBIC ACID) 500 MG tablet Take 1,000 mg by mouth daily.  . vitamin E 400 UNIT capsule Take 400 Units by mouth daily.    Review of Systems  Constitutional: Negative.   Respiratory: Negative.   Cardiovascular: Negative.   Gastrointestinal: Negative.   Endocrine: Negative.   Musculoskeletal: Negative.     Social History  Substance Use Topics  . Smoking status: Never Smoker  . Smokeless tobacco: Never Used  . Alcohol use No   Objective:   BP (!) 142/84 (BP Location: Right Arm, Patient Position: Sitting, Cuff Size: Large)   Pulse 62   Temp 97.8 F (36.6 C) (Oral)   Wt 215 lb 6.4 oz (97.7 kg)   BMI 33.74 kg/m   Physical Exam  Constitutional: She is oriented to person, place, and time. She appears well-developed and well-nourished.  Eyes: Conjunctivae and EOM are normal. Pupils are equal, round, and reactive to light.  Neck: Normal range of motion. Neck supple. Carotid bruit is not present.  Cardiovascular: Normal rate, regular rhythm and normal heart sounds.   Pulmonary/Chest: Effort normal and breath sounds normal.  Musculoskeletal: Normal range of motion.  Neurological: She is alert and oriented to person, place, and time.  Skin: Skin is warm and dry.  Psychiatric: She has a normal mood and affect. Her behavior is normal. Judgment and thought content normal.        Assessment & Plan:     1. Essential (primary) hypertension Stable. Continue Medication. Follow up in 4 months - CBC With Differential - Comprehensive metabolic panel - TSH  2. Type 2 diabetes mellitus without complication, without long-term current use of insulin (HCC) Check A1C on Friday or Tuesday. If A1C is stable or less than it was when checked last okay to keep with current treatment and lifestyle changes. If 8.0% or above will need treatment and recheck in 3  months.  - Comprehensive metabolic panel - HgB 123456  3. Hypercholesteremia Stable. Continue medication. Recheck labs.  - Lipid panel 4. Breast cancer    Patient was seen and examined by Dr. [name], and note scribed by Tiburcio Pea CMA I have done the exam and reviewed the above chart and it is accurate to the best of my knowledge.    Richard Cranford Mon, MD  [facility]

## 2016-04-05 NOTE — Patient Instructions (Addendum)

## 2016-04-05 NOTE — Progress Notes (Signed)
Patient ID: Leah Schaefer, female   DOB: [birth year], 75 y.o.   MRN: [id number]  Chief Complaint  Patient presents with  . Follow-up    HPI Leah Schaefer is a 75 y.o. female.  Here for follow up breast cancer. No new issues. About 3 weeks ago she felt a little knot with some tenderness-" stinging" left lateral breast but not sure if it was there before. It has not changed din size and is not as tender as before. Tolerating letrozole. She is down 8 pounds since June.  HPI  Past Medical History:  Diagnosis Date  . Breast cancer ([place])   . 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  . Cystitis   . Diabetes mellitus without complication (HCC)    NO MEDS-DIET CONTROLLED  . GERD (gastroesophageal reflux disease)   . Hernia 2011  . Hypertension   . Malignant neoplasm of upper-inner quadrant of female breast ([place] 08/17/2011   Left, T2 (2.3 cm) N0, triple negative. Chemotherapy/post wide excision whole breast radiation.  . 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 Personal history of malignant neoplasm of breast   . Vertigo     Past Surgical History:  Procedure Laterality Date  . BREAST BIOPSY Left 2013  . BREAST LUMPECTOMY WITH SENTINEL LYMPH NODE BIOPSY Right 09/30/2015   Procedure: BREAST LUMPECTOMY WITH SENTINEL LYMPH NODE BX;  Surgeon: JRobert Bellow MD;  Location: [facility] ORS;  Service: General;  Laterality: Right;  . BREAST SURGERY Left 2012   wide excision  . BREAST SURGERY Left November 2013   Core biopsy of the upper-outer quadrant showed fat necrosis.  . CHOLECYSTECTOMY  2007  . COLONOSCOPY  2011   Dr OCandace Cruise . COLONOSCOPY WITH PROPOFOL N/A 02/25/2015   Procedure: COLONOSCOPY WITH PROPOFOL;  Surgeon: PHulen Luster MD;  [facility];  Service: Gastroenterology;  Laterality: N/A;  . DILATION AND CURETTAGE OF UTERUS  2004  . HERNIA REPAIR  Right 2009   inguinal, and umbilical  . PORT-A-CATH REMOVAL    . PORTACATH PLACEMENT  2013  . UPPER GI ENDOSCOPY  2011    Family History  Problem Relation Age of Onset  . Lymphoma Father   . Ovarian cancer Sister   . Colon cancer Brother   . Lymphoma Brother   . Breast cancer Neg Hx     Social History Social History  Substance Use Topics  . Smoking status: Never Smoker  . Smokeless tobacco: Never Used  . Alcohol use No    No Known Allergies  Current Outpatient Prescriptions  Medication Sig Dispense Refill  . ACCU-CHEK FASTCLIX LANCETS MISC     . ACCU-CHEK SMARTVIEW test strip     . acetaminophen (TYLENOL) 500 MG tablet Take 500 mg by mouth every 6 (six) hours as needed for mild pain or headache.     .Marland Kitchenacyclovir (ZOVIRAX) 200 MG capsule TAKE 1 CAPSULE EVERY DAY 90 capsule 1  . Alcohol Swabs (B-D SINGLE USE SWABS REGULAR) PADS     . aspirin EC 81 MG tablet Take 81 mg by mouth daily.    .Marland Kitchenatorvastatin (LIPITOR) 10 MG tablet TAKE 1 TABLET EVERY EVENING 90 tablet 1  . bimatoprost (LUMIGAN) 0.01 % SOLN Place 1 drop into both eyes at bedtime.     . Blood Glucose Calibration (ACCU-CHEK SMARTVIEW CONTROL) LIQD     .  Calcium Carb-Cholecalciferol (CALCIUM 600+D3) 600-800 MG-UNIT TABS Take 1 tablet by mouth 2 (two) times daily.    . cholecalciferol (VITAMIN D) 1000 units tablet Take 1,000 Units by mouth daily.    . cyanocobalamin 500 MCG tablet Take 500 mcg by mouth daily.    Marland Kitchen escitalopram (LEXAPRO) 20 MG tablet TAKE 1 TABLET EVERY DAY 90 tablet 1  . gabapentin (NEURONTIN) 300 MG capsule TAKE 2 CAPSULES AT BEDTIME 180 capsule 1  . letrozole (FEMARA) 2.5 MG tablet Take 1 tablet (2.5 mg total) by mouth daily. 30 tablet 11  . meclizine (ANTIVERT) 25 MG tablet Take 0.5 tablets (12.5 mg total) by mouth 3 (three) times daily as needed for dizziness. 15 tablet 0  . meloxicam (MOBIC) 15 MG tablet TAKE 1 TABLET EVERY DAY 90 tablet 3  . metoprolol tartrate (LOPRESSOR) 25 MG tablet TAKE 1/2  TABLET TWICE DAILY 90 tablet 3  . Multiple Vitamin (MULTIVITAMIN WITH MINERALS) TABS tablet Take 1 tablet by mouth daily.    Marland Kitchen omega-3 acid ethyl esters (LOVAZA) 1 g capsule Take 3 g by mouth daily.     Marland Kitchen omeprazole (PRILOSEC) 20 MG capsule TAKE 1 CAPSULE TWICE DAILY 180 capsule 1  . pyridoxine (B-6) 100 MG tablet Take 100 mg by mouth daily.    . vitamin C (ASCORBIC ACID) 500 MG tablet Take 1,000 mg by mouth daily.    . vitamin E 400 UNIT capsule Take 400 Units by mouth daily.     No current facility-administered medications for this visit.     Review of Systems Review of Systems  Constitutional: Negative.   Cardiovascular: Negative.     Blood pressure 124/82, pulse 74, resp. rate 12, height '5\' 7"'  (1.702 m), weight 211 lb (95.7 kg).  Physical Exam Physical Exam  Constitutional: She is oriented to person, place, and time. She appears well-developed and well-nourished.  HENT:  Mouth/Throat: Oropharynx is clear and moist.  Eyes: Conjunctivae are normal. No scleral icterus.  Neck: Neck supple.  Cardiovascular: Normal rate, regular rhythm and normal heart sounds.   Pulmonary/Chest: Effort normal and breath sounds normal. Right breast exhibits no inverted nipple, no mass, no nipple discharge, no skin change and no tenderness. Left breast exhibits no inverted nipple, no mass, no nipple discharge, no skin change and no tenderness.    3 CFN at 3 o'clock 1 cm thickening left breast. Left upper inner quadrant slight thickening laterally.   Abdominal: Soft. There is no tenderness.  Lymphadenopathy:    She has no cervical adenopathy.    She has no axillary adenopathy.  Neurological: She is alert and oriented to person, place, and time.  Skin: Skin is warm and dry.  Psychiatric: Her behavior is normal.    Data Reviewed Ultrasound examination of the left breast was completed in the area of palpable thickening. At the 3:00 position, 4 cm from the nipple a focal area of thickening was noted.  The ultrasound shows hyperemic tissue beginning at the dermis and extending up to 1.6 cm in diameter. Mild prominent thickening of the skin is noted with increased thickness from 0.26-0.37 cm. Within the area of hyperechoic tissue there is an anechoic area within shadowing measuring 0.4 x 0.65 x 0.66 cm. The patient was amenable to biopsy.  Alcohol was used for skin preparation followed by 10 mL of 0.5% Xylocaine with 0.25% Marcaine with 1-200,000 of epinephrine. A 14-gauge Finesse biopsy device was used. Pre-and post-fire images were obtained. 8 core samples were obtained with scant bleeding.  A postbiopsy clip was placed. Skin defect was closed with benzoin and Steri-Strips followed by Telfa and Tegaderm dressing.  Assessment    Likely fat necrosis left breast status post wide excision/radiation.    Plan    Post biopsy instructions were provided. The patient will be contacted when pathology results are available.       This information has been scribed by Karie Fetch RN, BSN,BC.   Robert Bellow 04/07/2016, 12:14 PM

## 2016-04-06 ENCOUNTER — Telehealth: Payer: Self-pay | Admitting: *Deleted

## 2016-04-06 NOTE — Telephone Encounter (Signed)
Pt notified by MD

## 2016-04-06 NOTE — Telephone Encounter (Signed)
Phone call from Dr Saralyn Pilar Pathology, recent left breast biopsy was benign, fat necrosis.

## 2016-04-07 DIAGNOSIS — E119 Type 2 diabetes mellitus without complications: Secondary | ICD-10-CM | POA: Diagnosis not present

## 2016-04-07 DIAGNOSIS — N6320 Unspecified lump in the left breast, unspecified quadrant: Secondary | ICD-10-CM | POA: Insufficient documentation

## 2016-04-07 DIAGNOSIS — I10 Essential (primary) hypertension: Secondary | ICD-10-CM | POA: Diagnosis not present

## 2016-04-07 DIAGNOSIS — E7800 Pure hypercholesterolemia, unspecified: Secondary | ICD-10-CM | POA: Diagnosis not present

## 2016-04-08 LAB — CBC WITH DIFFERENTIAL
BASOS ABS: 0 10*3/uL (ref 0.0–0.2)
Basos: 1 %
EOS (ABSOLUTE): 0.1 10*3/uL (ref 0.0–0.4)
EOS: 4 %
HEMATOCRIT: 44.8 % (ref 34.0–46.6)
HEMOGLOBIN: 15.2 g/dL (ref 11.1–15.9)
IMMATURE GRANS (ABS): 0 10*3/uL (ref 0.0–0.1)
IMMATURE GRANULOCYTES: 0 %
LYMPHS: 35 %
Lymphocytes Absolute: 1.1 10*3/uL (ref 0.7–3.1)
MCH: 30.6 pg (ref 26.6–33.0)
MCHC: 33.9 g/dL (ref 31.5–35.7)
MCV: 90 fL (ref 79–97)
MONOCYTES: 10 %
Monocytes Absolute: 0.3 10*3/uL (ref 0.1–0.9)
NEUTROS ABS: 1.6 10*3/uL (ref 1.4–7.0)
Neutrophils: 50 %
RBC: 4.96 x10E6/uL (ref 3.77–5.28)
RDW: 13.1 % (ref 12.3–15.4)
WBC: 3.2 10*3/uL — ABNORMAL LOW (ref 3.4–10.8)

## 2016-04-08 LAB — COMPREHENSIVE METABOLIC PANEL
ALBUMIN: 4.3 g/dL (ref 3.5–4.8)
ALT: 18 IU/L (ref 0–32)
AST: 22 IU/L (ref 0–40)
Albumin/Globulin Ratio: 1.5 (ref 1.2–2.2)
Alkaline Phosphatase: 91 IU/L (ref 39–117)
BUN / CREAT RATIO: 11 — AB (ref 12–28)
BUN: 9 mg/dL (ref 8–27)
Bilirubin Total: 0.3 mg/dL (ref 0.0–1.2)
CALCIUM: 9.9 mg/dL (ref 8.7–10.3)
CO2: 27 mmol/L (ref 18–29)
CREATININE: 0.82 mg/dL (ref 0.57–1.00)
Chloride: 98 mmol/L (ref 96–106)
GFR, EST AFRICAN AMERICAN: 82 mL/min/{1.73_m2} (ref >59)
GFR, EST NON AFRICAN AMERICAN: 71 mL/min/{1.73_m2} (ref >59)
GLUCOSE: 150 mg/dL — AB (ref 65–99)
Globulin, Total: 2.9 g/dL (ref 1.5–4.5)
Potassium: 4.5 mmol/L (ref 3.5–5.2)
Sodium: 141 mmol/L (ref 134–144)
TOTAL PROTEIN: 7.2 g/dL (ref 6.0–8.5)

## 2016-04-08 LAB — LIPID PANEL
CHOL/HDL RATIO: 3.5 ratio (ref 0.0–4.4)
CHOLESTEROL TOTAL: 118 mg/dL (ref 100–199)
HDL: 34 mg/dL — ABNORMAL LOW (ref >39)
LDL CALC: 61 mg/dL (ref 0–99)
Triglycerides: 117 mg/dL (ref 0–149)
VLDL CHOLESTEROL CAL: 23 mg/dL (ref 5–40)

## 2016-04-08 LAB — HEMOGLOBIN A1C
Est. average glucose Bld gHb Est-mCnc: 157 mg/dL
HEMOGLOBIN A1C: 7.1 % — AB (ref 4.8–5.6)

## 2016-04-08 LAB — TSH: TSH: 1.86 u[IU]/mL (ref 0.450–4.500)

## 2016-04-12 ENCOUNTER — Telehealth: Payer: Self-pay

## 2016-04-12 NOTE — Telephone Encounter (Signed)
Advised pt of lab results. Pt verbally acknowledges understanding. Jmya Uliano Drozdowski, CMA   

## 2016-04-12 NOTE — Telephone Encounter (Signed)
-----   Message from Jerrol Banana., MD sent at 04/12/2016 10:43 AM EDT ----- Labs all stable.

## 2016-04-14 ENCOUNTER — Encounter: Payer: Self-pay | Admitting: Internal Medicine

## 2016-04-14 ENCOUNTER — Inpatient Hospital Stay: Payer: Commercial Managed Care - HMO | Attending: Internal Medicine | Admitting: Internal Medicine

## 2016-04-14 DIAGNOSIS — Z9011 Acquired absence of right breast and nipple: Secondary | ICD-10-CM | POA: Diagnosis not present

## 2016-04-14 DIAGNOSIS — E785 Hyperlipidemia, unspecified: Secondary | ICD-10-CM | POA: Diagnosis not present

## 2016-04-14 DIAGNOSIS — R42 Dizziness and giddiness: Secondary | ICD-10-CM | POA: Insufficient documentation

## 2016-04-14 DIAGNOSIS — Z8041 Family history of malignant neoplasm of ovary: Secondary | ICD-10-CM | POA: Diagnosis not present

## 2016-04-14 DIAGNOSIS — Z79811 Long term (current) use of aromatase inhibitors: Secondary | ICD-10-CM | POA: Diagnosis not present

## 2016-04-14 DIAGNOSIS — Z923 Personal history of irradiation: Secondary | ICD-10-CM | POA: Diagnosis not present

## 2016-04-14 DIAGNOSIS — Z87440 Personal history of urinary (tract) infections: Secondary | ICD-10-CM | POA: Diagnosis not present

## 2016-04-14 DIAGNOSIS — Z807 Family history of other malignant neoplasms of lymphoid, hematopoietic and related tissues: Secondary | ICD-10-CM | POA: Diagnosis not present

## 2016-04-14 DIAGNOSIS — Z853 Personal history of malignant neoplasm of breast: Secondary | ICD-10-CM | POA: Insufficient documentation

## 2016-04-14 DIAGNOSIS — Z170 Estrogen receptor positive status [ER+]: Secondary | ICD-10-CM | POA: Diagnosis not present

## 2016-04-14 DIAGNOSIS — C50211 Malignant neoplasm of upper-inner quadrant of right female breast: Secondary | ICD-10-CM | POA: Diagnosis not present

## 2016-04-14 DIAGNOSIS — R232 Flushing: Secondary | ICD-10-CM | POA: Insufficient documentation

## 2016-04-14 DIAGNOSIS — I10 Essential (primary) hypertension: Secondary | ICD-10-CM | POA: Insufficient documentation

## 2016-04-14 DIAGNOSIS — E119 Type 2 diabetes mellitus without complications: Secondary | ICD-10-CM | POA: Diagnosis not present

## 2016-04-14 DIAGNOSIS — Z7982 Long term (current) use of aspirin: Secondary | ICD-10-CM | POA: Insufficient documentation

## 2016-04-14 DIAGNOSIS — K219 Gastro-esophageal reflux disease without esophagitis: Secondary | ICD-10-CM | POA: Diagnosis not present

## 2016-04-14 DIAGNOSIS — Z800 Family history of malignant neoplasm of digestive organs: Secondary | ICD-10-CM | POA: Insufficient documentation

## 2016-04-14 DIAGNOSIS — Z79899 Other long term (current) drug therapy: Secondary | ICD-10-CM | POA: Diagnosis not present

## 2016-04-14 NOTE — Progress Notes (Signed)
Pt reports last breast exam by Byrnett 1 week ago and a biopsy on left breast.

## 2016-04-14 NOTE — Assessment & Plan Note (Addendum)
#  2017 right breast cancer stage I ER/PR positive HER-2/neu negative status post lumpectomy. No chemotherapy. Status post adjuvant radiation. On Femara. Tolerating well without any major side effects.  # Genetic testing discussed- with family -declines.   # Bone density test April 2017 within normal limits. Continue calcium and vitamin D.  # follow up in 6 months/ no labs.

## 2016-04-14 NOTE — Progress Notes (Signed)
[facility] OFFICE PROGRESS NOTE  Patient Care Team: Jerrol Banana., MD as PCP - General (Family Medicine) Robert Bellow, MD (General Surgery) Margarita Rana, MD as Referring Physician (Family Medicine)   SUMMARY OF HEMATOLOGIC/ONCOLOGIC HISTORY:  Oncology History   # FEB 2017- RIGHT BREAST  STAGE I [T1b N0 M0]. ER/PR ER positive 100% PR positive 95% HER 2 FISH negative;  INVASIVE MAMMARY CARCINOMA WITH MICROPAPILLARY FEATURES. S/p Lumpec [Dr.Byrnett] & RT; No Oncotype; Femara  # 2013- LEFT BREAST- STAGE II; ER/PR/ Her 2 NEG s/p Lumpec [Dr.Byrnett] & RT  # BMD- April  2017- wnl; genetic testing- awaiting to make decision [May 2017]  # Vertigo [uses rolling walker]     Breast cancer of upper-inner quadrant of right female breast ([place])   09/23/2015 Initial Diagnosis    Breast cancer of upper-inner quadrant of right female breast [facility])       INTERVAL HISTORY:   A very pleasant 75 year old African-American female patient with above history of breast cancer most recently diagnosed in February 2017 is here for follow-up; patient currently on Femara and doing well.   Her appetite is good. Denies any weight loss. No chest pain or shortness of breath or cough. Denies any significant arthritic pain.  Chronic mild hot flashes. Not any worse. Her vertigo is improved. She is walking herself. Stated she had a biopsy of a lump on the breast-pathology fat necrosis.   REVIEW OF SYSTEMS:  A complete 10 point review of system is done which is negative except mentioned above/history of present illness.   PAST MEDICAL HISTORY :  Past Medical History:  Diagnosis Date  . Breast cancer ([place])   . Breast cancer of upper-inner quadrant of right female breast ([place]) 09/21/2015   T1bN0 " 17m; ER100%, PR 90%, HER-2/neu not overexpressed.  . Cystitis   . Diabetes mellitus without complication (HCC)    NO MEDS-DIET CONTROLLED  . GERD (gastroesophageal reflux disease)   . Hernia  2011  . Hypertension   . Malignant neoplasm of upper-inner quadrant of female breast ([place] 08/17/2011   Left, T2 (2.3 cm) N0, triple negative. Chemotherapy/post wide excision whole breast radiation.  . 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 Personal history of malignant neoplasm of breast   . Vertigo     PAST SURGICAL HISTORY :   Past Surgical History:  Procedure Laterality Date  . BREAST BIOPSY Left 2013  . BREAST LUMPECTOMY WITH SENTINEL LYMPH NODE BIOPSY Right 09/30/2015   Procedure: BREAST LUMPECTOMY WITH SENTINEL LYMPH NODE BX;  Surgeon: JRobert Bellow MD;  Location: [facility] ORS;  Service: General;  Laterality: Right;  . BREAST SURGERY Left 2012   wide excision  . BREAST SURGERY Left November 2013   Core biopsy of the upper-outer quadrant showed fat necrosis.  . CHOLECYSTECTOMY  2007  . COLONOSCOPY  2011   Dr OCandace Cruise . COLONOSCOPY WITH PROPOFOL N/A 02/25/2015   Procedure: COLONOSCOPY WITH PROPOFOL;  Surgeon: PHulen Luster MD;  [facility];  Service: Gastroenterology;  Laterality: N/A;  . DILATION AND CURETTAGE OF UTERUS  2004  . HERNIA REPAIR Right 2009   inguinal, and umbilical  . PORT-A-CATH REMOVAL    . PORTACATH PLACEMENT  2013  . UPPER GI ENDOSCOPY  2011    FAMILY HISTORY :   Family History  Problem Relation Age of Onset  . Lymphoma Father   . Ovarian cancer Sister   . Colon  cancer Brother   . Lymphoma Brother   . Breast cancer Neg Hx     SOCIAL HISTORY:   Social History  Substance Use Topics  . Smoking status: Never Smoker  . Smokeless tobacco: Never Used  . Alcohol use No    ALLERGIES:  has No Known Allergies.  MEDICATIONS:  Current Outpatient Prescriptions  Medication Sig Dispense Refill  . ACCU-CHEK FASTCLIX LANCETS MISC     . ACCU-CHEK SMARTVIEW test strip     . acetaminophen (TYLENOL) 500 MG tablet Take 500 mg by mouth every 6 (six) hours as needed for mild pain or headache.     Marland Kitchen acyclovir (ZOVIRAX) 200  MG capsule TAKE 1 CAPSULE EVERY DAY 90 capsule 1  . Alcohol Swabs (B-D SINGLE USE SWABS REGULAR) PADS     . aspirin EC 81 MG tablet Take 81 mg by mouth daily.    Marland Kitchen atorvastatin (LIPITOR) 10 MG tablet TAKE 1 TABLET EVERY EVENING 90 tablet 1  . bimatoprost (LUMIGAN) 0.01 % SOLN Place 1 drop into both eyes at bedtime.     . Blood Glucose Calibration (ACCU-CHEK SMARTVIEW CONTROL) LIQD     . Calcium Carb-Cholecalciferol (CALCIUM 600+D3) 600-800 MG-UNIT TABS Take 1 tablet by mouth 2 (two) times daily.    . cholecalciferol (VITAMIN D) 1000 units tablet Take 1,000 Units by mouth daily.    . cyanocobalamin 500 MCG tablet Take 500 mcg by mouth daily.    Marland Kitchen escitalopram (LEXAPRO) 20 MG tablet TAKE 1 TABLET EVERY DAY 90 tablet 1  . gabapentin (NEURONTIN) 300 MG capsule TAKE 2 CAPSULES AT BEDTIME 180 capsule 1  . letrozole (FEMARA) 2.5 MG tablet Take 1 tablet (2.5 mg total) by mouth daily. 30 tablet 11  . meclizine (ANTIVERT) 25 MG tablet Take 0.5 tablets (12.5 mg total) by mouth 3 (three) times daily as needed for dizziness. 15 tablet 0  . meloxicam (MOBIC) 15 MG tablet TAKE 1 TABLET EVERY DAY 90 tablet 3  . metoprolol tartrate (LOPRESSOR) 25 MG tablet TAKE 1/2 TABLET TWICE DAILY 90 tablet 3  . Multiple Vitamin (MULTIVITAMIN WITH MINERALS) TABS tablet Take 1 tablet by mouth daily.    Marland Kitchen omega-3 acid ethyl esters (LOVAZA) 1 g capsule Take 3 g by mouth daily.     Marland Kitchen omeprazole (PRILOSEC) 20 MG capsule TAKE 1 CAPSULE TWICE DAILY 180 capsule 1  . pyridoxine (B-6) 100 MG tablet Take 100 mg by mouth daily.    . vitamin C (ASCORBIC ACID) 500 MG tablet Take 1,000 mg by mouth daily.    . vitamin E 400 UNIT capsule Take 400 Units by mouth daily.     No current facility-administered medications for this visit.     PHYSICAL EXAMINATION: ECOG PERFORMANCE STATUS: 0 - Asymptomatic  BP 128/86 (BP Location: Left Arm, Patient Position: Sitting)   Pulse 72   Temp (!) 96.6 F (35.9 C) (Tympanic)   Resp 16   Ht '5\' 7"'$   (1.702 m)   Wt 210 lb 9.6 oz (95.5 kg)   BMI 32.98 kg/m   Filed Weights   04/14/16 1141  Weight: 210 lb 9.6 oz (95.5 kg)    GENERAL: Well-nourished well-developed; Alert, no distress and comfortable.   Alone.  EYES: no pallor or icterus OROPHARYNX: no thrush or ulceration; good dentition  NECK: supple, no masses felt LYMPH:  no palpable lymphadenopathy in the cervical, axillary or inguinal regions LUNGS: clear to auscultation and  No wheeze or crackles HEART/CVS: regular rate & rhythm and no  murmurs; No lower extremity edema ABDOMEN:abdomen soft, non-tender and normal bowel sounds Musculoskeletal:no cyanosis of digits and no clubbing  PSYCH: alert & oriented x 3 with fluent speech NEURO: no focal motor/sensory deficits SKIN:  no rashes or significant lesions     LABORATORY DATA:  I have reviewed the data as listed    Component Value Date/Time   NA 141 04/07/2016 1002   NA 140 12/23/2012 0949   K 4.5 04/07/2016 1002   K 4.7 12/23/2012 0949   CL 98 04/07/2016 1002   CL 101 12/23/2012 0949   CO2 27 04/07/2016 1002   CO2 30 12/23/2012 0949   GLUCOSE 150 (H) 04/07/2016 1002   GLUCOSE 211 (H) 12/24/2015 1032   GLUCOSE 168 (H) 12/23/2012 0949   BUN 9 04/07/2016 1002   BUN 9 12/23/2012 0949   CREATININE 0.82 04/07/2016 1002   CREATININE 0.92 09/04/2014 0916   CALCIUM 9.9 04/07/2016 1002   CALCIUM 9.8 12/23/2012 0949   PROT 7.2 04/07/2016 1002   PROT 7.4 09/04/2014 0916   ALBUMIN 4.3 04/07/2016 1002   ALBUMIN 3.7 09/04/2014 0916   AST 22 04/07/2016 1002   AST 21 09/04/2014 0916   ALT 18 04/07/2016 1002   ALT 34 09/04/2014 0916   ALKPHOS 91 04/07/2016 1002   ALKPHOS 85 09/04/2014 0916   BILITOT 0.3 04/07/2016 1002   BILITOT 0.4 09/04/2014 0916   GFRNONAA 71 04/07/2016 1002   GFRNONAA >60 09/04/2014 0916   GFRNONAA >60 02/18/2014 1048   GFRAA 82 04/07/2016 1002   GFRAA >60 09/04/2014 0916   GFRAA >60 02/18/2014 1048    No results found for: SPEP, UPEP  Lab  Results  Component Value Date   WBC 3.2 (L) 04/07/2016   NEUTROABS 1.6 04/07/2016   HGB 15.3 12/24/2015   HCT 44.8 04/07/2016   MCV 90 04/07/2016   PLT 224 12/24/2015      Chemistry      Component Value Date/Time   NA 141 04/07/2016 1002   NA 140 12/23/2012 0949   K 4.5 04/07/2016 1002   K 4.7 12/23/2012 0949   CL 98 04/07/2016 1002   CL 101 12/23/2012 0949   CO2 27 04/07/2016 1002   CO2 30 12/23/2012 0949   BUN 9 04/07/2016 1002   BUN 9 12/23/2012 0949   CREATININE 0.82 04/07/2016 1002   CREATININE 0.92 09/04/2014 0916      Component Value Date/Time   CALCIUM 9.9 04/07/2016 1002   CALCIUM 9.8 12/23/2012 0949   ALKPHOS 91 04/07/2016 1002   ALKPHOS 85 09/04/2014 0916   AST 22 04/07/2016 1002   AST 21 09/04/2014 0916   ALT 18 04/07/2016 1002   ALT 34 09/04/2014 0916   BILITOT 0.3 04/07/2016 1002   BILITOT 0.4 09/04/2014 0916        ASSESSMENT & PLAN:   Breast cancer of upper-inner quadrant of right female breast ([place]) # 2017 right breast cancer stage I ER/PR positive HER-2/neu negative status post lumpectomy. No chemotherapy. Status post adjuvant radiation. On Femara. Tolerating well without any major side effects.  # Genetic testing discussed- with family -declines.   # Bone density test April 2017 within normal limits. Continue calcium and vitamin D.  # follow up in 6 months/ no labs.       Cammie Sickle, MD 04/14/2016 1:00 PM

## 2016-05-12 ENCOUNTER — Ambulatory Visit: Payer: Commercial Managed Care - HMO | Admitting: Radiation Oncology

## 2016-05-12 ENCOUNTER — Inpatient Hospital Stay
Admission: RE | Admit: 2016-05-12 | Payer: Commercial Managed Care - HMO | Source: Ambulatory Visit | Admitting: Radiation Oncology

## 2016-05-25 ENCOUNTER — Ambulatory Visit
Admission: RE | Admit: 2016-05-25 | Discharge: 2016-05-25 | Disposition: A | Discharge status: Home / Self Care | Payer: Commercial Managed Care - HMO | Source: Ambulatory Visit | Attending: Radiation Oncology | Admitting: Radiation Oncology

## 2016-05-25 VITALS — BP 122/80 | HR 80 | Temp 96.1°F | Resp 18 | Wt 210.1 lb

## 2016-05-25 DIAGNOSIS — C50912 Malignant neoplasm of unspecified site of left female breast: Secondary | ICD-10-CM | POA: Insufficient documentation

## 2016-05-25 DIAGNOSIS — Z170 Estrogen receptor positive status [ER+]: Secondary | ICD-10-CM | POA: Diagnosis not present

## 2016-05-25 DIAGNOSIS — C50211 Malignant neoplasm of upper-inner quadrant of right female breast: Secondary | ICD-10-CM

## 2016-05-25 DIAGNOSIS — Z79811 Long term (current) use of aromatase inhibitors: Secondary | ICD-10-CM | POA: Diagnosis not present

## 2016-05-25 DIAGNOSIS — Z923 Personal history of irradiation: Secondary | ICD-10-CM | POA: Insufficient documentation

## 2016-05-25 NOTE — Progress Notes (Signed)
Radiation Oncology Follow up Note  Name: Leah Schaefer   Date:   05/25/2016 MRN:  [id number] DOB: 1940/09/05    This 75 y.o. female presents to the clinic today for six-month follow-up status post accelerated partial breast radiation to her right breast for stage I invasive mammary carcinoma.  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 She is seen today in routine follow-up and is doing well. She is currently on letrozole and tolerating that well without side effect. She specifically denies breast tenderness cough or bone pain. She did have a repeat biopsy of the left breast back in August which was benign with fat necrosis. She is scheduled for follow-up mammograms in February.  COMPLICATIONS OF TREATMENT: none  FOLLOW UP COMPLIANCE: keeps appointments   PHYSICAL EXAM:  BP 122/80   Pulse 80   Temp (!) 96.1 F (35.6 C)   Resp 18   Wt 210 lb 1.6 oz (95.3 kg)   BMI 32.91 kg/m  Macro breast exam. She has multiple incisions which are well-healed on both breasts and some scar tissue present no distinct nodularity or mass is identified.Lungs are clear to A&P cardiac examination essentially unremarkable with regular rate and rhythm. No dominant mass or nodularity is noted in either breast in 2 positions examined. Incision is well-healed. No axillary or supraclavicular adenopathy is appreciated. Cosmetic result is excellent. Well-developed well-nourished patient in NAD. HEENT reveals PERLA, EOMI, discs not visualized.  Oral cavity is clear. No oral mucosal lesions are identified. Neck is clear without evidence of cervical or supraclavicular adenopathy. Lungs are clear  to A&P. Cardiac examination is essentially unremarkable with regular rate and rhythm without murmur rub or thrill. Abdomen is benign with no organomegaly or masses noted. Motor sensory and DTR levels are equal and symmetric in the upper and lower extremities. Cranial nerves II through XII are grossly intact. Proprioception is intact. No peripheral adenopathy or edema is identified. No motor or sensory levels are noted. Crude visual fields are within normal range.  RADIOLOGY RESULTS: Latest ultrasound reviewed.  PLAN: Present time she continues to do well with no evidence of disease. I'm please were overall progress. I've asked to see her back in 6 months for follow-up. She continues on letrozole without side effect. She has follow-up mammogram scheduled for February. She knows to call with any concerns.  I would like to take this opportunity to thank you for allowing me to participate in the care of your patient.Armstead Peaks., MD

## 2016-07-18 ENCOUNTER — Other Ambulatory Visit: Payer: Self-pay | Admitting: Family Medicine

## 2016-07-18 DIAGNOSIS — K219 Gastro-esophageal reflux disease without esophagitis: Secondary | ICD-10-CM

## 2016-08-01 ENCOUNTER — Other Ambulatory Visit: Payer: Self-pay | Admitting: Family Medicine

## 2016-08-08 ENCOUNTER — Ambulatory Visit (INDEPENDENT_AMBULATORY_CARE_PROVIDER_SITE_OTHER): Payer: Commercial Managed Care - HMO | Admitting: Family Medicine

## 2016-08-08 ENCOUNTER — Encounter: Payer: Self-pay | Admitting: Family Medicine

## 2016-08-08 VITALS — BP 132/78 | HR 78 | Temp 98.1°F | Resp 16 | Wt 214.0 lb

## 2016-08-08 DIAGNOSIS — I10 Essential (primary) hypertension: Secondary | ICD-10-CM

## 2016-08-08 DIAGNOSIS — E119 Type 2 diabetes mellitus without complications: Secondary | ICD-10-CM

## 2016-08-08 LAB — POCT GLYCOSYLATED HEMOGLOBIN (HGB A1C): Hemoglobin A1C: 7.7

## 2016-08-08 NOTE — Progress Notes (Signed)
Subjective:  HPI  Diabetes Mellitus Type II, Follow-up:   Lab Results  Component Value Date   HGBA1C 7.1 (H) 04/07/2016   HGBA1C 7.6 01/06/2016   HGBA1C 7.1 10/05/2015    Last seen for diabetes 5 months ago.  Management since then includes none. She reports good compliance with treatment. She is not having side effects.  Current symptoms include none and have been unchanged. Home blood sugar records: no higher than 180's  Episodes of hypoglycemia? no   Current Insulin Regimen: n/a Most Recent Eye Exam: pt states that she is due.  Current exercise: swimming and walking 3 times a week.  Pertinent Labs:    Component Value Date/Time   CHOL 118 04/07/2016 1002   TRIG 117 04/07/2016 1002   HDL 34 (L) 04/07/2016 1002   LDLCALC 61 04/07/2016 1002   CREATININE 0.82 04/07/2016 1002   CREATININE 0.92 09/04/2014 0916    Wt Readings from Last 3 Encounters:  08/08/16 214 lb (97.1 kg)  05/25/16 210 lb 1.6 oz (95.3 kg)  04/14/16 210 lb 9.6 oz (95.5 kg)    ------------------------------------------------------------------------    Prior to Admission medications   Medication Sig Start Date End Date Taking? Authorizing Provider  ACCU-CHEK FASTCLIX LANCETS MISC CHECK BLOOD SUGAR ONE TIME DAILY 08/01/16  Yes Jerrol Banana., MD  ACCU-CHEK SMARTVIEW test strip CHECK BLOOD SUGAR ONCE A DAY. 08/01/16  Yes Detroit Frieden Maceo Pro., MD  acetaminophen (TYLENOL) 500 MG tablet Take 500 mg by mouth every 6 (six) hours as needed for mild pain or headache.    Yes Historical Provider, MD  acyclovir (ZOVIRAX) 200 MG capsule TAKE 1 CAPSULE EVERY DAY 02/25/16  Yes Denys Salinger Maceo Pro., MD  Alcohol Swabs (B-D SINGLE USE SWABS REGULAR) PADS CHECK BLOOD SUGAR ONE TIME DAILY 08/01/16  Yes Jerrol Banana., MD  aspirin EC 81 MG tablet Take 81 mg by mouth daily.   Yes Historical Provider, MD  atorvastatin (LIPITOR) 10 MG tablet TAKE 1 TABLET EVERY EVENING 02/25/16  Yes Jhoselin Crume Maceo Pro.,  MD  bimatoprost (LUMIGAN) 0.01 % SOLN Place 1 drop into both eyes at bedtime.    Yes Historical Provider, MD  Blood Glucose Calibration (ACCU-CHEK SMARTVIEW CONTROL) LIQD  01/10/16  Yes Historical Provider, MD  Calcium Carb-Cholecalciferol (CALCIUM 600+D3) 600-800 MG-UNIT TABS Take 1 tablet by mouth 2 (two) times daily.   Yes Historical Provider, MD  cholecalciferol (VITAMIN D) 1000 units tablet Take 1,000 Units by mouth daily.   Yes Historical Provider, MD  cyanocobalamin 500 MCG tablet Take 500 mcg by mouth daily.   Yes Historical Provider, MD  escitalopram (LEXAPRO) 20 MG tablet TAKE 1 TABLET EVERY DAY 02/25/16  Yes Jerrol Banana., MD  gabapentin (NEURONTIN) 300 MG capsule TAKE 2 CAPSULES AT BEDTIME 03/29/16  Yes Gwenyth Dingee Maceo Pro., MD  letrozole [facility]) 2.5 MG tablet Take 1 tablet (2.5 mg total) by mouth daily. 11/16/15  Yes Robert Bellow, MD  meclizine (ANTIVERT) 25 MG tablet Take 0.5 tablets (12.5 mg total) by mouth 3 (three) times daily as needed for dizziness. 10/27/15  Yes Hinda Kehr, MD  meloxicam (MOBIC) 15 MG tablet TAKE 1 TABLET EVERY DAY 02/24/16  Yes Jerrol Banana., MD  metoprolol tartrate (LOPRESSOR) 25 MG tablet TAKE 1/2 TABLET TWICE DAILY 02/24/16  Yes Jerrol Banana., MD  Multiple Vitamin (MULTIVITAMIN WITH MINERALS) TABS tablet Take 1 tablet by mouth daily.   Yes Historical Provider, MD  omega-3 acid ethyl esters (LOVAZA) 1 g capsule Take 3 g by mouth daily.    Yes Historical Provider, MD  omeprazole (PRILOSEC) 20 MG capsule TAKE 1 CAPSULE TWICE DAILY 07/18/16  Yes Dare Sanger Maceo Pro., MD  pyridoxine (B-6) 100 MG tablet Take 100 mg by mouth daily.   Yes Historical Provider, MD  vitamin C (ASCORBIC ACID) 500 MG tablet Take 1,000 mg by mouth daily.   Yes Historical Provider, MD  vitamin E 400 UNIT capsule Take 400 Units by mouth daily.   Yes Historical Provider, MD  metFORMIN (GLUCOPHAGE) 500 MG tablet  04/13/16   Historical Provider, MD    Patient Active  Problem List   Diagnosis Date Noted  . Breast mass, left 04/07/2016  . Controlled type 2 diabetes mellitus without complication ([place]) 13/03/6577  . Breast cancer of upper-inner quadrant of right female breast ([place]) 09/23/2015  . Allergic rhinitis 03/30/2015  . Anxiety 03/30/2015  . Carotid arterial disease ([place]) 03/30/2015  . Diabetes mellitus, type 2 ([place]) 03/30/2015  . Essential (primary) hypertension 03/30/2015  . Hypercholesteremia 03/30/2015  . Left leg pain 03/30/2015  . Adenopathy 03/30/2015  . LBP (low back pain) 03/30/2015  . Neuropathic pain 03/30/2015  . Burning or prickling sensation 03/30/2015  . Neuralgia neuritis, sciatic nerve 03/30/2015  . Skin cyst 11/24/2014  . Breast cancer of upper-outer quadrant of left female breast ([place]) 08/17/2011  . Allergic reaction 11/29/2009  . Adaptation reaction 04/30/2009  . Acid reflux 04/30/2009  . Stricture and stenosis of cervix 01/28/2009    Past Medical History:  Diagnosis Date  . Breast cancer ([place])   . Breast cancer of upper-inner quadrant of right female breast ([place]) 09/21/2015   T1bN0 " 65m; ER100%, PR 90%, HER-2/neu not overexpressed.  . Cystitis   . Diabetes mellitus without complication ([facility])    NO MEDS-DIET CONTROLLED  . GERD (gastroesophageal reflux disease)   . Hernia 2011  . Hypertension   . Malignant neoplasm of upper-inner quadrant of female breast ([place] 08/17/2011   Left, T2 (2.3 cm) N0, triple negative. Chemotherapy/post wide excision whole breast radiation.  . 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 Personal history of malignant neoplasm of breast   . Vertigo     Social History   Social History  . Marital status: Married    Spouse name: N/A  . Number of children: 2  . Years of education: H/S   Occupational History  . Retired    Social History Main Topics  . Smoking status: Never Smoker  . Smokeless tobacco: Never Used  . Alcohol use No  . Drug use: No  .  Sexual activity: Not on file   Other Topics Concern  . Not on file   Social History Narrative  . No narrative on file    No Known Allergies  Review of Systems  Constitutional: Negative.   HENT: Negative.   Eyes: Negative.   Respiratory: Negative.   Cardiovascular: Negative.   Gastrointestinal: Negative.   Genitourinary: Negative.   Musculoskeletal: Negative.   Skin: Negative.   Neurological: Positive for tingling (and numbness).  Endo/Heme/Allergies: Negative.   Psychiatric/Behavioral: Negative.     Immunization History  Administered Date(s) Administered  . Influenza, High Dose Seasonal PF 05/29/2015, 04/05/2016  . Pneumococcal Conjugate-13 05/13/2014  . Pneumococcal Polysaccharide-23 06/07/2012  . Td 04/19/2007, 05/05/2011  . Tdap 05/05/2011  . Zoster 04/27/2008    Objective:  BP 132/78 (BP Location: Left Arm, Patient  Position: Sitting, Cuff Size: Large)   Pulse 78   Temp 98.1 F (36.7 C) (Oral)   Resp 16   Wt 214 lb (97.1 kg)   BMI 33.52 kg/m   Physical Exam  Lab Results  Component Value Date   WBC 3.2 (L) 04/07/2016   HGB 15.3 12/24/2015   HCT 44.8 04/07/2016   PLT 224 12/24/2015   GLUCOSE 150 (H) 04/07/2016   CHOL 118 04/07/2016   TRIG 117 04/07/2016   HDL 34 (L) 04/07/2016   LDLCALC 61 04/07/2016   TSH 1.860 04/07/2016   INR 1.0 01/18/2012   HGBA1C 7.1 (H) 04/07/2016   MICROALBUR Negative 03/30/2015    CMP     Component Value Date/Time   NA 141 04/07/2016 1002   NA 140 12/23/2012 0949   K 4.5 04/07/2016 1002   K 4.7 12/23/2012 0949   CL 98 04/07/2016 1002   CL 101 12/23/2012 0949   CO2 27 04/07/2016 1002   CO2 30 12/23/2012 0949   GLUCOSE 150 (H) 04/07/2016 1002   GLUCOSE 211 (H) 12/24/2015 1032   GLUCOSE 168 (H) 12/23/2012 0949   BUN 9 04/07/2016 1002   BUN 9 12/23/2012 0949   CREATININE 0.82 04/07/2016 1002   CREATININE 0.92 09/04/2014 0916   CALCIUM 9.9 04/07/2016 1002   CALCIUM 9.8 12/23/2012 0949   PROT 7.2 04/07/2016 1002     PROT 7.4 09/04/2014 0916   ALBUMIN 4.3 04/07/2016 1002   ALBUMIN 3.7 09/04/2014 0916   AST 22 04/07/2016 1002   AST 21 09/04/2014 0916   ALT 18 04/07/2016 1002   ALT 34 09/04/2014 0916   ALKPHOS 91 04/07/2016 1002   ALKPHOS 85 09/04/2014 0916   BILITOT 0.3 04/07/2016 1002   BILITOT 0.4 09/04/2014 0916   GFRNONAA 71 04/07/2016 1002   GFRNONAA >60 09/04/2014 0916   GFRNONAA >60 02/18/2014 1048   GFRAA 82 04/07/2016 1002   GFRAA >60 09/04/2014 0916   GFRAA >60 02/18/2014 1048    Assessment and Plan :  1. Type 2 diabetes mellitus without complication, without long-term current use of insulin ([place]) Patient has not been adhering to diet. She will do so. Return to clinic 3 months - POCT HgB A1C--7.7 today.  2. Essential (primary) hypertension  3. Diabetic neuropathy  I have done the exam and reviewed the chart and it is accurate to the best of my knowledge. Development worker, community has been used and  any errors in dictation or transcription are unintentional. [name] M.D. [place] MD [facility] 08/08/2016 1:58 PM

## 2016-08-15 ENCOUNTER — Telehealth: Payer: Self-pay | Admitting: Family Medicine

## 2016-08-15 ENCOUNTER — Other Ambulatory Visit: Payer: Self-pay

## 2016-08-15 MED ORDER — GABAPENTIN 300 MG PO CAPS: 600.0000 mg | ORAL_CAPSULE | Freq: Every day | ORAL | 3 refills | Status: DC

## 2016-08-15 NOTE — Telephone Encounter (Signed)
Please review-aa 

## 2016-08-15 NOTE — Telephone Encounter (Signed)
Pt contacted office for refill request on the following medications:  gabapentin (NEURONTIN) 300 MG capsule.  [facility] mail order.  CB#[phone]/MW

## 2016-08-15 NOTE — Telephone Encounter (Signed)
Ok for 1 year. 

## 2016-08-28 ENCOUNTER — Telehealth: Payer: Self-pay

## 2016-08-28 NOTE — Telephone Encounter (Signed)
I contacted patient today to complete NSABP Protocol B-47 Medical Conditions and Lifestyle Questionnaire Follow-up.  Patient 60 month follow up questionnaire was due and patient agreed for it to be completed via telephone.  Patient answered all questions and questionnaire faxed to NSABP as required by study participation.

## 2016-10-03 DIAGNOSIS — H40153 Residual stage of open-angle glaucoma, bilateral: Secondary | ICD-10-CM | POA: Diagnosis not present

## 2016-10-03 DIAGNOSIS — H524 Presbyopia: Secondary | ICD-10-CM | POA: Diagnosis not present

## 2016-10-12 ENCOUNTER — Inpatient Hospital Stay: Payer: Medicare HMO | Attending: Internal Medicine | Admitting: Internal Medicine

## 2016-10-12 ENCOUNTER — Inpatient Hospital Stay: Payer: Medicare HMO

## 2016-10-12 VITALS — BP 126/82 | HR 79 | Temp 95.8°F | Wt 212.2 lb

## 2016-10-12 DIAGNOSIS — Z7982 Long term (current) use of aspirin: Secondary | ICD-10-CM | POA: Insufficient documentation

## 2016-10-12 DIAGNOSIS — E119 Type 2 diabetes mellitus without complications: Secondary | ICD-10-CM | POA: Insufficient documentation

## 2016-10-12 DIAGNOSIS — R42 Dizziness and giddiness: Secondary | ICD-10-CM

## 2016-10-12 DIAGNOSIS — Z923 Personal history of irradiation: Secondary | ICD-10-CM | POA: Diagnosis not present

## 2016-10-12 DIAGNOSIS — R5383 Other fatigue: Secondary | ICD-10-CM

## 2016-10-12 DIAGNOSIS — Z79811 Long term (current) use of aromatase inhibitors: Secondary | ICD-10-CM

## 2016-10-12 DIAGNOSIS — Z8041 Family history of malignant neoplasm of ovary: Secondary | ICD-10-CM

## 2016-10-12 DIAGNOSIS — Z853 Personal history of malignant neoplasm of breast: Secondary | ICD-10-CM | POA: Insufficient documentation

## 2016-10-12 DIAGNOSIS — K219 Gastro-esophageal reflux disease without esophagitis: Secondary | ICD-10-CM

## 2016-10-12 DIAGNOSIS — Z800 Family history of malignant neoplasm of digestive organs: Secondary | ICD-10-CM

## 2016-10-12 DIAGNOSIS — Z79899 Other long term (current) drug therapy: Secondary | ICD-10-CM

## 2016-10-12 DIAGNOSIS — C50411 Malignant neoplasm of upper-outer quadrant of right female breast: Secondary | ICD-10-CM | POA: Insufficient documentation

## 2016-10-12 DIAGNOSIS — I10 Essential (primary) hypertension: Secondary | ICD-10-CM

## 2016-10-12 DIAGNOSIS — Z170 Estrogen receptor positive status [ER+]: Secondary | ICD-10-CM | POA: Diagnosis not present

## 2016-10-12 LAB — CBC WITH DIFFERENTIAL/PLATELET
Basophils Absolute: 0.1 10*3/uL (ref 0–0.1)
Basophils Relative: 1 %
EOS ABS: 0.2 10*3/uL (ref 0–0.7)
Eosinophils Relative: 3 %
HEMATOCRIT: 43.2 % (ref 35.0–47.0)
HEMOGLOBIN: 15.1 g/dL (ref 12.0–16.0)
LYMPHS ABS: 2 10*3/uL (ref 1.0–3.6)
Lymphocytes Relative: 37 %
MCH: 30.9 pg (ref 26.0–34.0)
MCHC: 35 g/dL (ref 32.0–36.0)
MCV: 88.4 fL (ref 80.0–100.0)
MONO ABS: 0.5 10*3/uL (ref 0.2–0.9)
MONOS PCT: 9 %
NEUTROS ABS: 2.8 10*3/uL (ref 1.4–6.5)
NEUTROS PCT: 50 %
Platelets: 235 10*3/uL (ref 150–440)
RBC: 4.89 MIL/uL (ref 3.80–5.20)
RDW: 12.9 % (ref 11.5–14.5)
WBC: 5.6 10*3/uL (ref 3.6–11.0)

## 2016-10-12 LAB — COMPREHENSIVE METABOLIC PANEL
ALK PHOS: 84 U/L (ref 38–126)
ALT: 25 U/L (ref 14–54)
ANION GAP: 8 (ref 5–15)
AST: 29 U/L (ref 15–41)
Albumin: 4.3 g/dL (ref 3.5–5.0)
BILIRUBIN TOTAL: 0.5 mg/dL (ref 0.3–1.2)
BUN: 12 mg/dL (ref 6–20)
CALCIUM: 9.7 mg/dL (ref 8.9–10.3)
CO2: 28 mmol/L (ref 22–32)
Chloride: 100 mmol/L — ABNORMAL LOW (ref 101–111)
Creatinine, Ser: 0.75 mg/dL (ref 0.44–1.00)
GFR calc non Af Amer: >60 mL/min (ref >60)
Glucose, Bld: 151 mg/dL — ABNORMAL HIGH (ref 65–99)
Potassium: 4.2 mmol/L (ref 3.5–5.1)
Sodium: 136 mmol/L (ref 135–145)
Total Protein: 7.9 g/dL (ref 6.5–8.1)

## 2016-10-12 LAB — TSH: TSH: 1.695 u[IU]/mL (ref 0.350–4.500)

## 2016-10-12 NOTE — Progress Notes (Signed)
Patient here today for follow up.  Patient c/o fatigue 

## 2016-10-12 NOTE — Progress Notes (Signed)
[place] OFFICE PROGRESS NOTE  Patient Care Team: Jerrol Banana., MD as PCP - General (Family Medicine) Robert Bellow, MD (General Surgery) Margarita Rana, MD as Referring Physician (Family Medicine)   SUMMARY OF HEMATOLOGIC/ONCOLOGIC HISTORY:  Oncology History   # FEB 2017- RIGHT BREAST  STAGE I [T1b N0 M0]. ER/PR ER positive 100% PR positive 95% HER 2 FISH negative;  INVASIVE MAMMARY CARCINOMA WITH MICROPAPILLARY FEATURES. S/p Lumpec [Dr.Byrnett] & RT; No Oncotype; Femara  # 2013- LEFT BREAST- STAGE II; ER/PR/ Her 2 NEG s/p Lumpec [Dr.Byrnett] & RT  # BMD- April  2017- wnl; genetic testing- awaiting to make decision [May 2017]  # Vertigo [uses rolling walker]     Carcinoma of upper-outer quadrant of right breast in female, estrogen receptor positive ([place])   08/17/2011 Initial Diagnosis    Breast cancer of upper-outer quadrant of left female breast [facility])       INTERVAL HISTORY:   A very pleasant 76 year old African-American female patient with above history of breast cancer most recently diagnosed in February 2017 is here for follow-up; patient currently on Femara and doing well.   She complains of worsening fatigue over the last few months. Her appetite is good. Denies any weight loss. No chest pain or shortness of breath or cough. Denies any significant arthritic pain.  Chronic mild hot flashes. Not any worse.   REVIEW OF SYSTEMS:  A complete 10 point review of system is done which is negative except mentioned above/history of present illness.   PAST MEDICAL HISTORY :  Past Medical History:  Diagnosis Date  . Breast cancer ([place])   . Breast cancer of upper-inner quadrant of right female breast ([place]) 09/21/2015   T1bN0 " 77m; ER100%, PR 90%, HER-2/neu not overexpressed.  . Cystitis   . Diabetes mellitus without complication ([facility])    NO MEDS-DIET CONTROLLED  . GERD (gastroesophageal reflux disease)   . Hernia 2011  . Hypertension   .  Malignant ne[place] of [place] 08[place]17[place]   Left, T2 (2.3 cm) N0, triple negative. Chemotherapy/post wide excision whole breast radiation.  . 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 Personal history of malignant neoplasm of breast   . Vertigo     PAST SURGICAL HISTORY :   Past Surgical History:  Procedure Laterality Date  . BREAST BIOPSY Left 2013  . BREAST LUMPECTOMY WITH SENTINEL LYMPH NODE BIOPSY Right 09/30/2015   Procedure: BREAST LUMPECTOMY WITH SENTINEL LYMPH NODE BX;  Surgeon: JRobert Bellow MD;  Location: [facility] ORS;  Service: General;  Laterality: Right;  . BREAST SURGERY Left 2012   wide excision  . BREAST SURGERY Left November 2013   Core biopsy of the upper-outer quadrant showed fat necrosis.  . CHOLECYSTECTOMY  2007  . COLONOSCOPY  2011   Dr OCandace Cruise . COLONOSCOPY WITH PROPOFOL N/A 02/25/2015   Procedure: COLONOSCOPY WITH PROPOFOL;  Surgeon: PHulen Luster MD;  [facility];  Service: Gastroenterology;  Laterality: N/A;  . DILATION AND CURETTAGE OF UTERUS  2004  . HERNIA REPAIR Right 2009   inguinal, and umbilical  . PORT-A-CATH REMOVAL    . PORTACATH PLACEMENT  2013  . UPPER GI ENDOSCOPY  2011    FAMILY HISTORY :   Family History  Problem Relation Age of Onset  . Lymphoma Father   . Ovarian cancer Sister   . Colon cancer Brother   . Lymphoma Brother   .  Breast cancer Neg Hx     SOCIAL HISTORY:   Social History  Substance Use Topics  . Smoking status: Never Smoker  . Smokeless tobacco: Never Used  . Alcohol use No    ALLERGIES:  has No Known Allergies.  MEDICATIONS:  Current Outpatient Prescriptions  Medication Sig Dispense Refill  . acetaminophen (TYLENOL) 500 MG tablet Take 500 mg by mouth every 6 (six) hours as needed for mild pain or headache.     Marland Kitchen acyclovir (ZOVIRAX) 200 MG capsule TAKE 1 CAPSULE EVERY DAY 90 capsule 1  . aspirin EC 81 MG tablet Take 81 mg by mouth daily.     Marland Kitchen atorvastatin (LIPITOR) 10 MG tablet TAKE 1 TABLET EVERY EVENING 90 tablet 1  . bimatoprost (LUMIGAN) 0.01 % SOLN Place 1 drop into both eyes at bedtime.     . Calcium Carb-Cholecalciferol (CALCIUM 600+D3) 600-800 MG-UNIT TABS Take 1 tablet by mouth 2 (two) times daily.    . cholecalciferol (VITAMIN D) 1000 units tablet Take 1,000 Units by mouth daily.    . cyanocobalamin 500 MCG tablet Take 500 mcg by mouth daily.    Marland Kitchen escitalopram (LEXAPRO) 20 MG tablet TAKE 1 TABLET EVERY DAY 90 tablet 1  . gabapentin (NEURONTIN) 300 MG capsule Take 2 capsules (600 mg total) by mouth at bedtime. 180 capsule 3  . letrozole (FEMARA) 2.5 MG tablet Take 1 tablet (2.5 mg total) by mouth daily. 30 tablet 11  . meclizine (ANTIVERT) 25 MG tablet Take 0.5 tablets (12.5 mg total) by mouth 3 (three) times daily as needed for dizziness. 15 tablet 0  . meloxicam (MOBIC) 15 MG tablet TAKE 1 TABLET EVERY DAY 90 tablet 3  . metoprolol tartrate (LOPRESSOR) 25 MG tablet TAKE 1/2 TABLET TWICE DAILY 90 tablet 3  . Multiple Vitamin (MULTIVITAMIN WITH MINERALS) TABS tablet Take 1 tablet by mouth daily.    Marland Kitchen omega-3 acid ethyl esters (LOVAZA) 1 g capsule Take 3 g by mouth daily.     Marland Kitchen omeprazole (PRILOSEC) 20 MG capsule TAKE 1 CAPSULE TWICE DAILY 180 capsule 1  . pyridoxine (B-6) 100 MG tablet Take 100 mg by mouth daily.    . vitamin C (ASCORBIC ACID) 500 MG tablet Take 1,000 mg by mouth daily.    . vitamin E 400 UNIT capsule Take 400 Units by mouth daily.    Marland Kitchen ACCU-CHEK FASTCLIX LANCETS MISC CHECK BLOOD SUGAR ONE TIME DAILY 102 each 3  . ACCU-CHEK SMARTVIEW test strip CHECK BLOOD SUGAR ONCE A DAY. 100 each 3  . Alcohol Swabs (B-D SINGLE USE SWABS REGULAR) PADS CHECK BLOOD SUGAR ONE TIME DAILY 100 each 3  . Blood Glucose Calibration (ACCU-CHEK SMARTVIEW CONTROL) LIQD      No current facility-administered medications for this visit.     PHYSICAL EXAMINATION: ECOG PERFORMANCE STATUS: 0 - Asymptomatic  BP 126/82 (BP  Location: Left Arm, Patient Position: Sitting)   Pulse 79   Temp (!) 95.8 F (35.4 C) (Tympanic)   Wt 212 lb 4 oz (96.3 kg)   BMI 33.24 kg/m   Filed Weights   10/12/16 1047  Weight: 212 lb 4 oz (96.3 kg)    GENERAL: Well-nourished well-developed; Alert, no distress and comfortable.   Alone.  EYES: no pallor or icterus OROPHARYNX: no thrush or ulceration; good dentition  NECK: supple, no masses felt LYMPH:  no palpable lymphadenopathy in the cervical, axillary or inguinal regions LUNGS: clear to auscultation and  No wheeze or crackles HEART/CVS: regular rate & rhythm  and no murmurs; No lower extremity edema ABDOMEN:abdomen soft, non-tender and normal bowel sounds Musculoskeletal:no cyanosis of digits and no clubbing  PSYCH: alert & oriented x 3 with fluent speech NEURO: no focal motor/sensory deficits SKIN:  no rashes or significant lesions     LABORATORY DATA:  I have reviewed the data as listed    Component Value Date/Time   NA 136 10/12/2016 1120   NA 141 04/07/2016 1002   NA 140 12/23/2012 0949   K 4.2 10/12/2016 1120   K 4.7 12/23/2012 0949   CL 100 (L) 10/12/2016 1120   CL 101 12/23/2012 0949   CO2 28 10/12/2016 1120   CO2 30 12/23/2012 0949   GLUCOSE 151 (H) 10/12/2016 1120   GLUCOSE 168 (H) 12/23/2012 0949   BUN 12 10/12/2016 1120   BUN 9 04/07/2016 1002   BUN 9 12/23/2012 0949   CREATININE 0.75 10/12/2016 1120   CREATININE 0.92 09/04/2014 0916   CALCIUM 9.7 10/12/2016 1120   CALCIUM 9.8 12/23/2012 0949   PROT 7.9 10/12/2016 1120   PROT 7.2 04/07/2016 1002   PROT 7.4 09/04/2014 0916   ALBUMIN 4.3 10/12/2016 1120   ALBUMIN 4.3 04/07/2016 1002   ALBUMIN 3.7 09/04/2014 0916   AST 29 10/12/2016 1120   AST 21 09/04/2014 0916   ALT 25 10/12/2016 1120   ALT 34 09/04/2014 0916   ALKPHOS 84 10/12/2016 1120   ALKPHOS 85 09/04/2014 0916   BILITOT 0.5 10/12/2016 1120   BILITOT 0.3 04/07/2016 1002   BILITOT 0.4 09/04/2014 0916   GFRNONAA >60 10/12/2016 1120    GFRNONAA >60 09/04/2014 0916   GFRNONAA >60 02/18/2014 1048   GFRAA >60 10/12/2016 1120   GFRAA >60 09/04/2014 0916   GFRAA >60 02/18/2014 1048    No results found for: SPEP, UPEP  Lab Results  Component Value Date   WBC 5.6 10/12/2016   NEUTROABS 2.8 10/12/2016   HGB 15.1 10/12/2016   HCT 43.2 10/12/2016   MCV 88.4 10/12/2016   PLT 235 10/12/2016      Chemistry      Component Value Date/Time   NA 136 10/12/2016 1120   NA 141 04/07/2016 1002   NA 140 12/23/2012 0949   K 4.2 10/12/2016 1120   K 4.7 12/23/2012 0949   CL 100 (L) 10/12/2016 1120   CL 101 12/23/2012 0949   CO2 28 10/12/2016 1120   CO2 30 12/23/2012 0949   BUN 12 10/12/2016 1120   BUN 9 04/07/2016 1002   BUN 9 12/23/2012 0949   CREATININE 0.75 10/12/2016 1120   CREATININE 0.92 09/04/2014 0916      Component Value Date/Time   CALCIUM 9.7 10/12/2016 1120   CALCIUM 9.8 12/23/2012 0949   ALKPHOS 84 10/12/2016 1120   ALKPHOS 85 09/04/2014 0916   AST 29 10/12/2016 1120   AST 21 09/04/2014 0916   ALT 25 10/12/2016 1120   ALT 34 09/04/2014 0916   BILITOT 0.5 10/12/2016 1120   BILITOT 0.3 04/07/2016 1002   BILITOT 0.4 09/04/2014 0916        ASSESSMENT & PLAN:   Carcinoma of upper-outer quadrant of right breast in female, estrogen receptor positive ([place]) # 2017 right breast cancer stage I ER/PR positive HER-2/neu negative status post lumpectomy. No chemotherapy. Status post adjuvant radiation. On Femara. Tolerating well without any major side effects.  # Genetic testing discussed- with family -declines.   # Fatigue- check labs cbc/cmp/tsh.   # Bone density test April 2017 within normal limits.  Continue calcium and vitamin D.  # follow up in 6 months/ labs.       Cammie Sickle, MD 10/13/2016 5:04 PM

## 2016-10-12 NOTE — Assessment & Plan Note (Addendum)
#  2017 right breast cancer stage I ER/PR positive HER-2/neu negative status post lumpectomy. No chemotherapy. Status post adjuvant radiation. On Femara. Tolerating well without any major side effects.  # Genetic testing discussed- with family -declines.   # Fatigue- check labs cbc/cmp/tsh.   # Bone density test April 2017 within normal limits. Continue calcium and vitamin D.  # follow up in 6 months/ labs.

## 2016-11-09 ENCOUNTER — Other Ambulatory Visit: Payer: Self-pay | Admitting: General Surgery

## 2016-11-09 DIAGNOSIS — C50211 Malignant neoplasm of upper-inner quadrant of right female breast: Secondary | ICD-10-CM

## 2016-11-13 ENCOUNTER — Telehealth: Payer: Self-pay | Admitting: General Surgery

## 2016-11-13 NOTE — Telephone Encounter (Signed)
PATIENT CALLED TO CHECK TO SEE WHEN HER NEXT APPOINTMENT WILL BE WITH DR BYRNETT.SHE IS IN RECALL FOR 11-2017 FOR A F/U AFTER HAVING A BONE DENSITY TEST. SHE WAS LAST SEEN 04-05-16 FOR HER BREAST.DOES SHE NEED A FOLLOW UP APPT AND MAMMOGRAMS SCHEDULED FOR 2018? PLEASE ADVISE.

## 2016-11-17 NOTE — Telephone Encounter (Signed)
Patient is due for a follow up bilateral diagnostic mammogram now and OV to follow.

## 2016-11-23 ENCOUNTER — Other Ambulatory Visit: Payer: Self-pay

## 2016-11-23 DIAGNOSIS — Z170 Estrogen receptor positive status [ER+]: Principal | ICD-10-CM

## 2016-11-23 DIAGNOSIS — C50211 Malignant neoplasm of upper-inner quadrant of right female breast: Secondary | ICD-10-CM

## 2016-12-06 ENCOUNTER — Ambulatory Visit (INDEPENDENT_AMBULATORY_CARE_PROVIDER_SITE_OTHER): Payer: Medicare HMO | Admitting: Family Medicine

## 2016-12-06 ENCOUNTER — Other Ambulatory Visit: Payer: Self-pay | Admitting: Family Medicine

## 2016-12-06 ENCOUNTER — Ambulatory Visit (INDEPENDENT_AMBULATORY_CARE_PROVIDER_SITE_OTHER): Payer: Medicare HMO

## 2016-12-06 ENCOUNTER — Ambulatory Visit: Payer: Commercial Managed Care - HMO | Admitting: Family Medicine

## 2016-12-06 VITALS — BP 146/88 | HR 80 | Temp 98.9°F | Ht 67.0 in | Wt 214.2 lb

## 2016-12-06 DIAGNOSIS — Z0000 Encounter for general adult medical examination without abnormal findings: Secondary | ICD-10-CM | POA: Diagnosis not present

## 2016-12-06 DIAGNOSIS — M545 Low back pain, unspecified: Secondary | ICD-10-CM

## 2016-12-06 DIAGNOSIS — I10 Essential (primary) hypertension: Secondary | ICD-10-CM | POA: Diagnosis not present

## 2016-12-06 DIAGNOSIS — E119 Type 2 diabetes mellitus without complications: Secondary | ICD-10-CM

## 2016-12-06 DIAGNOSIS — E7800 Pure hypercholesterolemia, unspecified: Secondary | ICD-10-CM

## 2016-12-06 DIAGNOSIS — K219 Gastro-esophageal reflux disease without esophagitis: Secondary | ICD-10-CM

## 2016-12-06 DIAGNOSIS — F4322 Adjustment disorder with anxiety: Secondary | ICD-10-CM

## 2016-12-06 LAB — POCT GLYCOSYLATED HEMOGLOBIN (HGB A1C)
Est. average glucose Bld gHb Est-mCnc: 177
HEMOGLOBIN A1C: 7.8

## 2016-12-06 NOTE — Progress Notes (Signed)
Subjective:   Leah Schaefer is a 76 y.o. female who presents for Medicare Annual (Subsequent) preventive examination.  Review of Systems:  N/A  Cardiac Risk Factors include: advanced age (>36mn, >>56women);diabetes mellitus;dyslipidemia;hypertension;obesity (BMI >30kg/m2)     Objective:     Vitals: BP (!) 146/88 (BP Location: Right Arm)   Pulse 80   Temp 98.9 F (37.2 C) (Oral)   Ht '5\' 7"'  (1.702 m)   Wt 214 lb 3.2 oz (97.2 kg)   BMI 33.55 kg/m   Body mass index is 33.55 kg/m.   Tobacco History  Smoking Status  . Never Smoker  Smokeless Tobacco  . Never Used     Counseling given: Not Answered   Past Medical History:  Diagnosis Date  . Breast cancer ([place]   . Breast cancer of upper-inner quadrant of right female breast ([facility] 09/21/2015   T1bN0 " 170m ER100%, PR 90%, HER-2/neu not overexpressed.  . Cystitis   . Diabetes mellitus without complication (HCC)    NO MEDS-DIET CONTROLLED  . GERD (gastroesophageal reflux disease)   . Hernia 2011  . Hypertension   . Malignant neoplasm of upper-inner quadrant of female breast ([facility]/05/2012   Left, T2 (2.3 cm) N0, triple negative. Chemotherapy/post wide excision whole breast radiation.  . 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 Personal history of malignant neoplasm of breast   . Vertigo    Past Surgical History:  Procedure Laterality Date  . BREAST BIOPSY Left 2013  . BREAST LUMPECTOMY WITH SENTINEL LYMPH NODE BIOPSY Right 09/30/2015   Procedure: BREAST LUMPECTOMY WITH SENTINEL LYMPH NODE BX;  Surgeon: JeRobert BellowMD;  Location: [facility] ORS;  Service: General;  Laterality: Right;  . BREAST SURGERY Left 2012   wide excision  . BREAST SURGERY Left November 2013   Core biopsy of the upper-outer quadrant showed fat necrosis.  . CHOLECYSTECTOMY  2007  . COLONOSCOPY  2011   Dr OhCandace Cruise. COLONOSCOPY WITH PROPOFOL N/A 02/25/2015   Procedure: COLONOSCOPY WITH PROPOFOL;  Surgeon: PaHulen LusterMD;  [facility];  Service: Gastroenterology;  Laterality: N/A;  . DILATION AND CURETTAGE OF UTERUS  2004  . HERNIA REPAIR Right 2009   inguinal, and umbilical  . PORT-A-CATH REMOVAL    . PORTACATH PLACEMENT  2013  . UPPER GI ENDOSCOPY  2011   Family History  Problem Relation Age of Onset  . Lymphoma Father   . Ovarian cancer Sister   . Colon cancer Brother   . Lymphoma Brother   . Cancer Other     stomach  . Cancer Other     stomach  . Breast cancer Neg Hx    History  Sexual Activity  . Sexual activity: Not on file    Outpatient Encounter Prescriptions as of 12/06/2016  Medication Sig  . ACCU-CHEK FASTCLIX LANCETS MISC CHECK BLOOD SUGAR ONE TIME DAILY  . ACCU-CHEK SMARTVIEW test strip CHECK BLOOD SUGAR ONCE A DAY.  . Marland Kitchencetaminophen (TYLENOL) 500 MG tablet Take 500 mg by mouth every 6 (six) hours as needed for mild pain or headache.   . Marland Kitchencyclovir (ZOVIRAX) 200 MG capsule TAKE 1 CAPSULE EVERY DAY  . Alcohol Swabs (B-D SINGLE USE SWABS REGULAR) PADS CHECK BLOOD SUGAR ONE TIME DAILY  . aspirin EC 81 MG tablet Take 81 mg by mouth daily.  . Marland Kitchentorvastatin (LIPITOR) 10 MG tablet TAKE 1 TABLET EVERY EVENING  . Blood Glucose Calibration (ACCU-CHEK  SMARTVIEW CONTROL) LIQD   . Calcium Carb-Cholecalciferol (CALCIUM 600+D3) 600-800 MG-UNIT TABS Take 1 tablet by mouth 2 (two) times daily.  . cholecalciferol (VITAMIN D) 1000 units tablet Take 1,000 Units by mouth daily.  . cyanocobalamin 500 MCG tablet Take 500 mcg by mouth daily.  Marland Kitchen escitalopram (LEXAPRO) 20 MG tablet TAKE 1 TABLET EVERY DAY  . gabapentin (NEURONTIN) 300 MG capsule Take 2 capsules (600 mg total) by mouth at bedtime.  Marland Kitchen latanoprost (XALATAN) 0.005 % ophthalmic solution Place 1 drop into both eyes at bedtime.   Marland Kitchen letrozole (FEMARA) 2.5 MG tablet TAKE ONE TABLET BY MOUTH ONCE DAILY  . meclizine (ANTIVERT) 25 MG tablet Take 0.5 tablets (12.5 mg total) by mouth 3 (three) times daily as needed for dizziness.  .  meloxicam (MOBIC) 15 MG tablet TAKE 1 TABLET EVERY DAY  . metoprolol tartrate (LOPRESSOR) 25 MG tablet TAKE 1/2 TABLET TWICE DAILY  . Multiple Vitamin (MULTIVITAMIN WITH MINERALS) TABS tablet Take 1 tablet by mouth daily.  Marland Kitchen omega-3 acid ethyl esters (LOVAZA) 1 g capsule Take 3 g by mouth daily.   Marland Kitchen omeprazole (PRILOSEC) 20 MG capsule TAKE 1 CAPSULE TWICE DAILY  . pyridoxine (B-6) 100 MG tablet Take 100 mg by mouth daily.  . vitamin C (ASCORBIC ACID) 500 MG tablet Take 1,000 mg by mouth daily.  . vitamin E 400 UNIT capsule Take 400 Units by mouth daily.  . bimatoprost (LUMIGAN) 0.01 % SOLN Place 1 drop into both eyes at bedtime.    No facility-administered encounter medications on file as of 12/06/2016.     Activities of Daily Living In your present state of health, do you have any difficulty performing the following activities: 12/06/2016  Hearing? N  Vision? N  Difficulty concentrating or making decisions? N  Walking or climbing stairs? N  Dressing or bathing? N  Doing errands, shopping? N  Preparing Food and eating ? N  Using the Toilet? N  In the past six months, have you accidently leaked urine? N  Do you have problems with loss of bowel control? N  Managing your Medications? N  Managing your Finances? N  Housekeeping or managing your Housekeeping? N  Some recent data might be hidden    Patient Care Team: Jerrol Banana., MD as PCP - General (Family Medicine) Robert Bellow, MD (General Surgery) Cammie Sickle, MD as Consulting Physician (Internal Medicine) Crista Curb. Gloriann Loan, MD as Consulting Physician (Ophthalmology) Noreene Filbert, MD as Referring Physician (Radiation Oncology)    Assessment:     Exercise Activities and Dietary recommendations Current Exercise Habits: Structured exercise class, Type of exercise: walking;treadmill, Time (Minutes): > 60, Frequency (Times/Week): 5, Weekly Exercise (Minutes/Week): 0, Intensity: Moderate, Exercise limited by: None  identified  Goals    . Increase lean proteins          Recommend decreasing fatty meats and increasing lean meats in diet.      Fall Risk Fall Risk  12/06/2016 10/18/2015 05/14/2015  Falls in the past year? No No No   Depression Screen PHQ 2/9 Scores 12/06/2016 12/06/2016 10/18/2015 05/14/2015  PHQ - 2 Score 0 0 0 0  PHQ- 9 Score 0 - - -     Cognitive Function     6CIT Screen 12/06/2016  What Year? 0 points  What month? 0 points  What time? 0 points  Count back from 20 0 points  Months in reverse 4 points  Repeat phrase 4 points  Total Score 8  Immunization History  Administered Date(s) Administered  . Influenza, High Dose Seasonal PF 05/29/2015, 04/05/2016  . Pneumococcal Conjugate-13 05/13/2014  . Pneumococcal Polysaccharide-23 06/07/2012  . Td 04/19/2007, 05/05/2011  . Tdap 05/05/2011  . Zoster 04/27/2008   Screening Tests Health Maintenance  Topic Date Due  . FOOT EXAM  03/29/2016  . URINE MICROALBUMIN  03/29/2016  . HEMOGLOBIN A1C  02/05/2017  . INFLUENZA VACCINE  03/07/2017  . MAMMOGRAM  09/15/2017  . OPHTHALMOLOGY EXAM  10/11/2017  . TETANUS/TDAP  05/04/2021  . COLONOSCOPY  02/24/2025  . DEXA SCAN  Completed  . PNA vac Low Risk Adult  Completed      Plan:  I have personally reviewed and addressed the Medicare Annual Wellness questionnaire and have noted the following in the patient's chart:  A. Medical and social history B. Use of alcohol, tobacco or illicit drugs  C. Current medications and supplements D. Functional ability and status E.  Nutritional status F.  Physical activity G. Advance directives H. List of other physicians I.  Hospitalizations, surgeries, and ER visits in previous 12 months J.  [place] such as hearing and vision if needed, cognitive and depression L. Referrals and appointments - none  In addition, I have reviewed and discussed with patient certain preventive protocols, quality metrics, and best practice  recommendations. A written personalized care plan for preventive services as well as general preventive health recommendations were provided to patient.  See attached scanned questionnaire for additional information.   Signed,  Fabio Neighbors, LPN Nurse Health Advisor   MD Recommendations: Needs diabetic foot exam and urine microalbumin today. I have reviewed the health advisors note, was  available for consultation and I agree with documentation and plan. [name] MD [facility]

## 2016-12-06 NOTE — Progress Notes (Signed)
Patient: Leah Schaefer, Female    DOB: 1941-03-05, 76 y.o.   MRN: [id number] Visit Date: 12/06/2016  Today's Provider: [name], MD   Chief Complaint  Patient presents with  . Annual Wellness exam  . Diabetes  . Hypertension   Subjective:    Annual wellness visit Leah Schaefer is a 76 y.o. female. She feels well. She reports exercising not regularly. She reports she is sleeping fairly well. She has 2 children and 1 granddaughter. Colonoscopy- 02/25/2015. Normal repeat in 5 years. Mammogram- scheduled for 12/08/2016. BMD- 11/25/2015. Normal.    Diabetes Mellitus Type II, Follow-up:   Lab Results  Component Value Date   HGBA1C 7.8 12/06/2016   HGBA1C 7.7 08/08/2016   HGBA1C 7.1 (H) 04/07/2016    Last seen for diabetes 4 months ago.  Management since then includes no changes. She reports good compliance with treatment. She is not having side effects.  Current symptoms include none and have been stable.  Episodes of hypoglycemia? no   Current Insulin Regimen: none Most Recent Eye Exam: up to date Weight trend: stable Prior visit with dietician: no Current diet: well balanced Current exercise: cardiovascular workout on exercise equipment and no regular exercise  Pertinent Labs:    Component Value Date/Time   CHOL 118 04/07/2016 1002   TRIG 117 04/07/2016 1002   HDL 34 (L) 04/07/2016 1002   LDLCALC 61 04/07/2016 1002   CREATININE 0.75 10/12/2016 1120   CREATININE 0.92 09/04/2014 0916    Wt Readings from Last 3 Encounters:  12/06/16 214 lb 3.2 oz (97.2 kg)  10/12/16 212 lb 4 oz (96.3 kg)  08/08/16 214 lb (97.1 kg)      Hypertension, follow-up:  BP Readings from Last 3 Encounters:  12/06/16 (!) 146/88  10/12/16 126/82  08/08/16 132/78    She was last seen for hypertension 4 months ago.  BP at that visit was 132/78. Management since that visit includes no changes. She reports good compliance with treatment. She is not having side  effects.  She is exercising. She is adherent to low salt diet.   Outside blood pressures are checked occasionally. She is experiencing none.  Patient denies exertional chest pressure/discomfort, lower extremity edema and palpitations.   Cardiovascular risk factors include diabetes mellitus and dyslipidemia.   Weight trend: stable Wt Readings from Last 3 Encounters:  12/06/16 214 lb 3.2 oz (97.2 kg)  10/12/16 212 lb 4 oz (96.3 kg)  08/08/16 214 lb (97.1 kg)    Current diet: well balanced       Review of Systems  Constitutional: Negative.   Respiratory: Negative.   Cardiovascular: Negative.  Negative for chest pain.  Gastrointestinal: Negative.   Endocrine: Negative.   Genitourinary: Negative.   Musculoskeletal: Negative.   Allergic/Immunologic: Negative.   Neurological: Negative.   Hematological: Negative.   Psychiatric/Behavioral: Negative.     Social History   Social History  . Marital status: Married    Spouse name: N/A  . Number of children: 2  . Years of education: H/S   Occupational History  . Retired    Social History Main Topics  . Smoking status: Never Smoker  . Smokeless tobacco: Never Used  . Alcohol use No  . Drug use: No  . Sexual activity: Not on file   Other Topics Concern  . Not on file   Social History Narrative  . No narrative on file    Past Medical History:  Diagnosis Date  .  Breast cancer ([place])   . Breast cancer of upper-inner quadrant of right female breast ([place]) 09/21/2015   T1bN0 " 50m; ER100%, PR 90%, HER-2/neu not overexpressed.  . Cystitis   . Diabetes mellitus without complication (HCC)    NO MEDS-DIET CONTROLLED  . GERD (gastroesophageal reflux disease)   . Hernia 2011  . Hypertension   . Malignant ne[place] of upper-inner quadrant of female breast ([place] 08/17/2011   Left, T2 (2.3 cm) N0, triple negative. Chemotherapy/post wide excision whole breast radiation.  . Other benign neoplasm of connective and other soft  tissue of thorax   . Personal history of malignant neoplasm of breast   . Vertigo      Patient Active Problem List   Diagnosis Date Noted  . Breast mass, left 04/07/2016  . Controlled type 2 diabetes mellitus without complication ([facility] 029/51/8841 . Allergic rhinitis 03/30/2015  . Anxiety 03/30/2015  . Carotid arterial disease ([facility] 03/30/2015  . Diabetes mellitus, type 2 ([place] 03/30/2015  . Essential (primary) hypertension 03/30/2015  . Hypercholesteremia 03/30/2015  . Left leg pain 03/30/2015  . Adenopathy 03/30/2015  . LBP (low back pain) 03/30/2015  . Neuropathic pain 03/30/2015  . Burning or prickling sensation 03/30/2015  . Neuralgia neuritis, sciatic nerve 03/30/2015  . Skin cyst 11/24/2014  . Carcinoma of upper-outer quadrant of right breast in female, estrogen receptor positive ([facility] 08/17/2011  . Allergic reaction 11/29/2009  . Adaptation reaction 04/30/2009  . Acid reflux 04/30/2009  . Stricture and stenosis of cervix 01/28/2009    Past Surgical History:  Procedure Laterality Date  . BREAST BIOPSY Left 2013  . BREAST LUMPECTOMY WITH SENTINEL LYMPH NODE BIOPSY Right 09/30/2015   Procedure: BREAST LUMPECTOMY WITH SENTINEL LYMPH NODE BX;  Surgeon: JRobert Bellow MD;  Location: [facility] ORS;  Service: General;  Laterality: Right;  . BREAST SURGERY Left 2012   wide excision  . BREAST SURGERY Left November 2013   Core biopsy of the upper-outer quadrant showed fat necrosis.  . CHOLECYSTECTOMY  2007  . COLONOSCOPY  2011   Dr OCandace Cruise . COLONOSCOPY WITH PROPOFOL N/A 02/25/2015   Procedure: COLONOSCOPY WITH PROPOFOL;  Surgeon: PHulen Luster MD;  [facility];  Service: Gastroenterology;  Laterality: N/A;  . DILATION AND CURETTAGE OF UTERUS  2004  . HERNIA REPAIR Right 2009   inguinal, and umbilical  . PORT-A-CATH REMOVAL    . PORTACATH PLACEMENT  2013  . UPPER GI ENDOSCOPY  2011    Her family history includes Cancer in her other and other; Colon cancer in her  brother; Lymphoma in her brother and father; Ovarian cancer in her sister.      Current Outpatient Prescriptions:  .  ACCU-CHEK FASTCLIX LANCETS MISC, CHECK BLOOD SUGAR ONE TIME DAILY, Disp: 102 each, Rfl: 3 .  ACCU-CHEK SMARTVIEW test strip, CHECK BLOOD SUGAR ONCE A DAY., Disp: 100 each, Rfl: 3 .  acetaminophen (TYLENOL) 500 MG tablet, Take 500 mg by mouth every 6 (six) hours as needed for mild pain or headache. , Disp: , Rfl:  .  acyclovir (ZOVIRAX) 200 MG capsule, TAKE 1 CAPSULE EVERY DAY, Disp: 90 capsule, Rfl: 1 .  Alcohol Swabs (B-D SINGLE USE SWABS REGULAR) PADS, CHECK BLOOD SUGAR ONE TIME DAILY, Disp: 100 each, Rfl: 3 .  aspirin EC 81 MG tablet, Take 81 mg by mouth daily., Disp: , Rfl:  .  atorvastatin (LIPITOR) 10 MG tablet, TAKE 1 TABLET EVERY EVENING, Disp: 90 tablet, Rfl: 1 .  bimatoprost (LUMIGAN)  0.01 % SOLN, Place 1 drop into both eyes at bedtime. , Disp: , Rfl:  .  Blood Glucose Calibration (ACCU-CHEK SMARTVIEW CONTROL) LIQD, , Disp: , Rfl:  .  Calcium Carb-Cholecalciferol (CALCIUM 600+D3) 600-800 MG-UNIT TABS, Take 1 tablet by mouth 2 (two) times daily., Disp: , Rfl:  .  cholecalciferol (VITAMIN D) 1000 units tablet, Take 1,000 Units by mouth daily., Disp: , Rfl:  .  cyanocobalamin 500 MCG tablet, Take 500 mcg by mouth daily., Disp: , Rfl:  .  escitalopram (LEXAPRO) 20 MG tablet, TAKE 1 TABLET EVERY DAY, Disp: 90 tablet, Rfl: 1 .  gabapentin (NEURONTIN) 300 MG capsule, Take 2 capsules (600 mg total) by mouth at bedtime., Disp: 180 capsule, Rfl: 3 .  latanoprost (XALATAN) 0.005 % ophthalmic solution, Place 1 drop into both eyes at bedtime. , Disp: , Rfl:  .  letrozole (FEMARA) 2.5 MG tablet, TAKE ONE TABLET BY MOUTH ONCE DAILY, Disp: 30 tablet, Rfl: 11 .  meclizine (ANTIVERT) 25 MG tablet, Take 0.5 tablets (12.5 mg total) by mouth 3 (three) times daily as needed for dizziness., Disp: 15 tablet, Rfl: 0 .  meloxicam (MOBIC) 15 MG tablet, TAKE 1 TABLET EVERY DAY, Disp: 90 tablet,  Rfl: 3 .  metoprolol tartrate (LOPRESSOR) 25 MG tablet, TAKE 1/2 TABLET TWICE DAILY, Disp: 90 tablet, Rfl: 3 .  Multiple Vitamin (MULTIVITAMIN WITH MINERALS) TABS tablet, Take 1 tablet by mouth daily., Disp: , Rfl:  .  omega-3 acid ethyl esters (LOVAZA) 1 g capsule, Take 3 g by mouth daily. , Disp: , Rfl:  .  omeprazole (PRILOSEC) 20 MG capsule, TAKE 1 CAPSULE TWICE DAILY, Disp: 180 capsule, Rfl: 1 .  pyridoxine (B-6) 100 MG tablet, Take 100 mg by mouth daily., Disp: , Rfl:  .  vitamin C (ASCORBIC ACID) 500 MG tablet, Take 1,000 mg by mouth daily., Disp: , Rfl:  .  vitamin E 400 UNIT capsule, Take 400 Units by mouth daily., Disp: , Rfl:   Patient Care Team: Jerrol Banana., MD as PCP - General (Family Medicine) Robert Bellow, MD (General Surgery) Cammie Sickle, MD as Consulting Physician (Internal Medicine) Crista Curb. Gloriann Loan, MD as Consulting Physician (Ophthalmology) Noreene Filbert, MD as Referring Physician (Radiation Oncology)     Objective:   Vitals:  BP    146/88 (BP Location: Right Arm)     Pulse  80     Temp  98.9 F (37.2 C) (Oral)     Ht  '5\' 7"'  (1.702 m)     Wt  214 lb 3.2 oz (97.2 kg)      BMI  33.55 kg/m      Physical Exam  Constitutional: She is oriented to person, place, and time. She appears well-developed and well-nourished.  HENT:  Head: Normocephalic and atraumatic.  Right Ear: External ear normal.  Left Ear: External ear normal.  Eyes: Conjunctivae are normal.  Neck: No thyromegaly present.  Cardiovascular: Normal rate, regular rhythm and normal heart sounds.   Pulmonary/Chest: Effort normal and breath sounds normal.  Abdominal: Soft.  Neurological: She is alert and oriented to person, place, and time. No cranial nerve deficit. She exhibits normal muscle tone. Coordination normal.  Skin: Skin is warm and dry.  Psychiatric: She has a normal mood and affect. Her behavior is normal. Judgment and thought content normal.     Activities of Daily Living In your present state of health, do you have any difficulty performing the following activities: 12/06/2016  Hearing? N  Vision? N  Difficulty concentrating or making decisions? N  Walking or climbing stairs? N  Dressing or bathing? N  Doing errands, shopping? N  Preparing Food and eating ? N  Using the Toilet? N  In the past six months, have you accidently leaked urine? N  Do you have problems with loss of bowel control? N  Managing your Medications? N  Managing your Finances? N  Housekeeping or managing your Housekeeping? N  Some recent data might be hidden    Fall Risk Assessment Fall Risk  12/06/2016 10/18/2015 05/14/2015  Falls in the past year? No No No     Depression Screen PHQ 2/9 Scores 12/06/2016 12/06/2016 10/18/2015 05/14/2015  PHQ - 2 Score 0 0 0 0  PHQ- 9 Score 0 - - -    6CIT Screen 12/06/2016  What Year? 0 points  What month? 0 points  What time? 0 points  Count back from 20 0 points  Months in reverse 4 points  Repeat phrase 4 points  Total Score 8      Assessment & Plan:     Annual Wellness Visit  Reviewed patient's Family Medical History Reviewed and updated list of patient's medical providers Assessment of cognitive impairment was done Assessed patient's functional ability Established a written schedule for health screening [place] Completed and Reviewed  Exercise Activities and Dietary recommendations Goals    . Increase lean proteins          Recommend decreasing fatty meats and increasing lean meats in diet.       Immunization History  Administered Date(s) Administered  . Influenza, High Dose Seasonal PF 05/29/2015, 04/05/2016  . Pneumococcal Conjugate-13 05/13/2014  . Pneumococcal Polysaccharide-23 06/07/2012  . Td 04/19/2007, 05/05/2011  . Tdap 05/05/2011  . Zoster 04/27/2008    Health Maintenance  Topic Date Due  . FOOT EXAM  03/29/2016  . URINE MICROALBUMIN  03/29/2016  .  HEMOGLOBIN A1C  02/05/2017  . INFLUENZA VACCINE  03/07/2017  . MAMMOGRAM  09/15/2017  . OPHTHALMOLOGY EXAM  10/11/2017  . TETANUS/TDAP  05/04/2021  . COLONOSCOPY  02/24/2025  . DEXA SCAN  Completed  . PNA vac Low Risk Adult  Completed    She Is starting an exercise program. Discussed health benefits of physical activity, and encouraged her to engage in regular exercise appropriate for her age and condition.  TIIDM A1C is 7.8 today.  HLD Breast Cancer On femara.     I have done the exam and reviewed the above chart and it is accurate to the best of my knowledge. Development worker, community has been used in this note in any air is in the dictation or transcription are unintentional.  [name], MD  [place]

## 2016-12-06 NOTE — Patient Instructions (Signed)
Ms. Leah Schaefer , Thank you for taking time to come for your Medicare Wellness Visit. I appreciate your ongoing commitment to your health goals. Please review the following plan we discussed and let me know if I can assist you in the future.   Screening recommendations/referrals: Colonoscopy: completed 02/25/15 Mammogram: completed 09/16/15, due 09/2016 Bone Density: completed 11/25/15, due 11/2025 Recommended yearly ophthalmology/optometry visit for glaucoma screening and checkup Recommended yearly dental visit for hygiene and checkup  Vaccinations: Influenza vaccine: up to date, due 04/2017 Pneumococcal vaccine: completed series Tdap vaccine: completed 05/05/11, due 04/2021 Shingles vaccine: completed 04/27/08    Advanced directives: Advance directive discussed with you today.  Conditions/risks identified: Diet: recommend decreasing fatty meats in daily diet and increasing lean meats.   Next appointment: None, need to schedule 1 year AWV   Preventive Care 37 Years and Older, Female Preventive care refers to lifestyle choices and visits with your health care provider that can promote health and wellness. What does preventive care include?  A yearly physical exam. This is also called an annual well check.  Dental exams once or twice a year.  Routine eye exams. Ask your health care provider how often you should have your eyes checked.  Personal lifestyle choices, including:  Daily care of your teeth and gums.  Regular physical activity.  Eating a healthy diet.  Avoiding tobacco and drug use.  Limiting alcohol use.  Practicing safe sex.  Taking low-dose aspirin every day.  Taking vitamin and mineral supplements as recommended by your health care provider. What happens during an annual well check? The services and screenings done by your health care provider during your annual well check will depend on your age, overall health, lifestyle risk factors, and family history of  disease. Counseling  Your health care provider may ask you questions about your:  Alcohol use.  Tobacco use.  Drug use.  Emotional well-being.  Home and relationship well-being.  Sexual activity.  Eating habits.  History of falls.  Memory and ability to understand (cognition).  Work and work Statistician.  Reproductive health. Screening  You may have the following tests or measurements:  Height, weight, and BMI.  Blood pressure.  Lipid and cholesterol levels. These may be checked every 5 years, or more frequently if you are over 23 years old.  Skin check.  Lung cancer screening. You may have this screening every year starting at age 34 if you have a 30-pack-year history of smoking and currently smoke or have quit within the past 15 years.  Fecal occult blood test (FOBT) of the stool. You may have this test every year starting at age 74.  Flexible sigmoidoscopy or colonoscopy. You may have a sigmoidoscopy every 5 years or a colonoscopy every 10 years starting at age 59.  Hepatitis C blood test.  Hepatitis B blood test.  Sexually transmitted disease (STD) testing.  Diabetes screening. This is done by checking your blood sugar (glucose) after you have not eaten for a while (fasting). You may have this done every 1-3 years.  Bone density scan. This is done to screen for osteoporosis. You may have this done starting at age 32.  Mammogram. This may be done every 1-2 years. Talk to your health care provider about how often you should have regular mammograms. Talk with your health care provider about your test results, treatment options, and if necessary, the need for more tests. Vaccines  Your health care provider may recommend certain vaccines, such as:  Influenza vaccine. This  is recommended every year.  Tetanus, diphtheria, and acellular pertussis (Tdap, Td) vaccine. You may need a Td booster every 10 years.  Zoster vaccine. You may need this after age  83.  Pneumococcal 13-valent conjugate (PCV13) vaccine. One dose is recommended after age 49.  Pneumococcal polysaccharide (PPSV23) vaccine. One dose is recommended after age 21. Talk to your health care provider about which screenings and vaccines you need and how often you need them. This information is not intended to replace advice given to you by your health care provider. Make sure you discuss any questions you have with your health care provider. Document Released: 08/20/2015 Document Revised: 04/12/2016 Document Reviewed: 05/25/2015 Elsevier Interactive Patient Education  2017 [place] Prevention in the Home Falls can cause injuries. They can happen to people of all ages. There are many things you can do to make your home safe and to help prevent falls. What can I do on the outside of my home?  Regularly fix the edges of walkways and driveways and fix any cracks.  Remove anything that might make you trip as you walk through a door, such as a raised step or threshold.  Trim any bushes or trees on the path to your home.  Use bright outdoor lighting.  Clear any walking paths of anything that might make someone trip, such as rocks or tools.  Regularly check to see if handrails are loose or broken. Make sure that both sides of any steps have handrails.  Any raised decks and porches should have guardrails on the edges.  Have any leaves, snow, or ice cleared regularly.  Use sand or salt on walking paths during winter.  Clean up any spills in your garage right away. This includes oil or grease spills. What can I do in the bathroom?  Use night lights.  Install grab bars by the toilet and in the tub and shower. Do not use towel bars as grab bars.  Use non-skid mats or decals in the tub or shower.  If you need to sit down in the shower, use a plastic, non-slip stool.  Keep the floor dry. Clean up any water that spills on the floor as soon as it happens.  Remove  soap buildup in the tub or shower regularly.  Attach bath mats securely with double-sided non-slip rug tape.  Do not have throw rugs and other things on the floor that can make you trip. What can I do in the bedroom?  Use night lights.  Make sure that you have a light by your bed that is easy to reach.  Do not use any sheets or blankets that are too big for your bed. They should not hang down onto the floor.  Have a firm chair that has side arms. You can use this for support while you get dressed.  Do not have throw rugs and other things on the floor that can make you trip. What can I do in the kitchen?  Clean up any spills right away.  Avoid walking on wet floors.  Keep items that you use a lot in easy-to-reach places.  If you need to reach something above you, use a strong step stool that has a grab bar.  Keep electrical cords out of the way.  Do not use floor polish or wax that makes floors slippery. If you must use wax, use non-skid floor wax.  Do not have throw rugs and other things on the floor that can make you  trip. What can I do with my stairs?  Do not leave any items on the stairs.  Make sure that there are handrails on both sides of the stairs and use them. Fix handrails that are broken or loose. Make sure that handrails are as long as the stairways.  Check any carpeting to make sure that it is firmly attached to the stairs. Fix any carpet that is loose or worn.  Avoid having throw rugs at the top or bottom of the stairs. If you do have throw rugs, attach them to the floor with carpet tape.  Make sure that you have a light switch at the top of the stairs and the bottom of the stairs. If you do not have them, ask someone to add them for you. What else can I do to help prevent falls?  Wear shoes that:  Do not have high heels.  Have rubber bottoms.  Are comfortable and fit you well.  Are closed at the toe. Do not wear sandals.  If you use a  stepladder:  Make sure that it is fully opened. Do not climb a closed stepladder.  Make sure that both sides of the stepladder are locked into place.  Ask someone to hold it for you, if possible.  Clearly mark and make sure that you can see:  Any grab bars or handrails.  First and last steps.  Where the edge of each step is.  Use tools that help you move around (mobility aids) if they are needed. These include:  Canes.  Walkers.  Scooters.  Crutches.  Turn on the lights when you go into a dark area. Replace any light bulbs as soon as they burn out.  Set up your furniture so you have a clear path. Avoid moving your furniture around.  If any of your floors are uneven, fix them.  If there are any pets around you, be aware of where they are.  Review your medicines with your doctor. Some medicines can make you feel dizzy. This can increase your chance of falling. Ask your doctor what other things that you can do to help prevent falls. This information is not intended to replace advice given to you by your health care provider. Make sure you discuss any questions you have with your health care provider. Document Released: 05/20/2009 Document Revised: 12/30/2015 Document Reviewed: 08/28/2014 Elsevier Interactive Patient Education  2017 [employer].

## 2016-12-08 ENCOUNTER — Ambulatory Visit
Admission: RE | Admit: 2016-12-08 | Discharge: 2016-12-08 | Disposition: A | Discharge status: Home / Self Care | Payer: Medicare HMO | Source: Ambulatory Visit | Attending: General Surgery | Admitting: General Surgery

## 2016-12-08 ENCOUNTER — Encounter: Payer: Self-pay | Admitting: Physician Assistant

## 2016-12-08 ENCOUNTER — Ambulatory Visit (INDEPENDENT_AMBULATORY_CARE_PROVIDER_SITE_OTHER): Payer: Medicare HMO | Admitting: Physician Assistant

## 2016-12-08 VITALS — BP 124/78 | HR 76 | Temp 98.5°F | Resp 16 | Wt 214.0 lb

## 2016-12-08 DIAGNOSIS — L03116 Cellulitis of left lower limb: Secondary | ICD-10-CM | POA: Diagnosis not present

## 2016-12-08 DIAGNOSIS — C50211 Malignant neoplasm of upper-inner quadrant of right female breast: Secondary | ICD-10-CM

## 2016-12-08 DIAGNOSIS — T148XXA Other injury of unspecified body region, initial encounter: Secondary | ICD-10-CM | POA: Diagnosis not present

## 2016-12-08 DIAGNOSIS — Z170 Estrogen receptor positive status [ER+]: Secondary | ICD-10-CM | POA: Diagnosis not present

## 2016-12-08 DIAGNOSIS — E118 Type 2 diabetes mellitus with unspecified complications: Secondary | ICD-10-CM | POA: Diagnosis not present

## 2016-12-08 DIAGNOSIS — Z9889 Other specified postprocedural states: Secondary | ICD-10-CM | POA: Insufficient documentation

## 2016-12-08 DIAGNOSIS — Z853 Personal history of malignant neoplasm of breast: Secondary | ICD-10-CM | POA: Insufficient documentation

## 2016-12-08 DIAGNOSIS — Z08 Encounter for follow-up examination after completed treatment for malignant neoplasm: Secondary | ICD-10-CM | POA: Diagnosis not present

## 2016-12-08 DIAGNOSIS — R928 Other abnormal and inconclusive findings on diagnostic imaging of breast: Secondary | ICD-10-CM | POA: Diagnosis not present

## 2016-12-08 HISTORY — DX: Personal history of antineoplastic chemotherapy: Z92.21

## 2016-12-08 HISTORY — DX: Personal history of irradiation: Z92.3

## 2016-12-08 MED ORDER — CEPHALEXIN 500 MG PO CAPS: 500.0000 mg | ORAL_CAPSULE | Freq: Two times a day (BID) | ORAL | 0 refills | Status: AC

## 2016-12-08 NOTE — Patient Instructions (Signed)
Cellulitis, Adult Cellulitis is a skin infection. The infected area is usually red and sore. This condition occurs most often in the arms and lower legs. It is very important to get treated for this condition. Follow these instructions at home:  Take over-the-counter and prescription medicines only as told by your doctor.  If you were prescribed an antibiotic medicine, take it as told by your doctor. Do not stop taking the antibiotic even if you start to feel better.  Drink enough fluid to keep your pee (urine) clear or pale yellow.  Do not touch or rub the infected area.  Raise (elevate) the infected area above the level of your heart while you are sitting or lying down.  Place warm or cold wet cloths (warm or cold compresses) on the infected area. Do this as told by your doctor.  Keep all follow-up visits as told by your doctor. This is important. These visits let your doctor make sure your infection is not getting worse. Contact a doctor if:  You have a fever.  Your symptoms do not get better after 1-2 days of treatment.  Your bone or joint under the infected area starts to hurt after the skin has healed.  Your infection comes back. This can happen in the same area or another area.  You have a swollen bump in the infected area.  You have new symptoms.  You feel ill and also have muscle aches and pains. Get help right away if:  Your symptoms get worse.  You feel very sleepy.  You throw up (vomit) or have watery poop (diarrhea) for a long time.  There are red streaks coming from the infected area.  Your red area gets larger.  Your red area turns darker. This information is not intended to replace advice given to you by your health care provider. Make sure you discuss any questions you have with your health care provider. Document Released: 01/10/2008 Document Revised: 12/30/2015 Document Reviewed: 06/02/2015 Elsevier Interactive Patient Education  2017 Elsevier  Inc[employer]  

## 2016-12-08 NOTE — Progress Notes (Signed)
Patient: Leah Schaefer Female    DOB: 1940-09-18   76 y.o.   MRN: [id number] Visit Date: 12/08/2016  Today's Provider: Trinna Post, PA-C   Chief Complaint  Patient presents with  . Wound Check    Left foot; was a blister Tueday.     Subjective:    Wound Check  She was originally treated 2 to 3 days ago. There has been clear discharge from the wound. There is no redness present. There is no swelling present. There is no pain present.   Pt is a 76 y/o woman who has a history of diabetes who was wearing Sandals the other day and had some blisters on the dorsum of her left foot. She states these blisters contained clear fluids and no pus or blood. She popped these with as sewing needle that she heated to release the fluid. Since then, she has been putting neosporin on it. She has sensation in her feet. No pain, blood, pus. No numbness or tingling.    No Known Allergies   Current Outpatient Prescriptions:  .  ACCU-CHEK FASTCLIX LANCETS MISC, CHECK BLOOD SUGAR ONE TIME DAILY, Disp: 102 each, Rfl: 3 .  ACCU-CHEK SMARTVIEW test strip, CHECK BLOOD SUGAR ONCE A DAY., Disp: 100 each, Rfl: 3 .  acetaminophen (TYLENOL) 500 MG tablet, Take 500 mg by mouth every 6 (six) hours as needed for mild pain or headache. , Disp: , Rfl:  .  acyclovir (ZOVIRAX) 200 MG capsule, TAKE 1 CAPSULE EVERY DAY, Disp: 90 capsule, Rfl: 3 .  Alcohol Swabs (B-D SINGLE USE SWABS REGULAR) PADS, CHECK BLOOD SUGAR ONE TIME DAILY, Disp: 100 each, Rfl: 3 .  aspirin EC 81 MG tablet, Take 81 mg by mouth daily., Disp: , Rfl:  .  atorvastatin (LIPITOR) 10 MG tablet, TAKE 1 TABLET EVERY EVENING, Disp: 90 tablet, Rfl: 3 .  bimatoprost (LUMIGAN) 0.01 % SOLN, Place 1 drop into both eyes at bedtime. , Disp: , Rfl:  .  Blood Glucose Calibration (ACCU-CHEK SMARTVIEW CONTROL) LIQD, , Disp: , Rfl:  .  Calcium Carb-Cholecalciferol (CALCIUM 600+D3) 600-800 MG-UNIT TABS, Take 1 tablet by mouth 2 (two) times daily., Disp: , Rfl:  .   cholecalciferol (VITAMIN D) 1000 units tablet, Take 1,000 Units by mouth daily., Disp: , Rfl:  .  cyanocobalamin 500 MCG tablet, Take 500 mcg by mouth daily., Disp: , Rfl:  .  escitalopram (LEXAPRO) 20 MG tablet, TAKE 1 TABLET EVERY DAY, Disp: 90 tablet, Rfl: 3 .  gabapentin (NEURONTIN) 300 MG capsule, Take 2 capsules (600 mg total) by mouth at bedtime., Disp: 180 capsule, Rfl: 3 .  latanoprost (XALATAN) 0.005 % ophthalmic solution, Place 1 drop into both eyes at bedtime. , Disp: , Rfl:  .  letrozole (FEMARA) 2.5 MG tablet, TAKE ONE TABLET BY MOUTH ONCE DAILY, Disp: 30 tablet, Rfl: 11 .  meclizine (ANTIVERT) 25 MG tablet, Take 0.5 tablets (12.5 mg total) by mouth 3 (three) times daily as needed for dizziness., Disp: 15 tablet, Rfl: 0 .  meloxicam (MOBIC) 15 MG tablet, TAKE 1 TABLET EVERY DAY, Disp: 90 tablet, Rfl: 3 .  metoprolol tartrate (LOPRESSOR) 25 MG tablet, TAKE 1/2 TABLET TWICE DAILY, Disp: 90 tablet, Rfl: 3 .  Multiple Vitamin (MULTIVITAMIN WITH MINERALS) TABS tablet, Take 1 tablet by mouth daily., Disp: , Rfl:  .  omega-3 acid ethyl esters (LOVAZA) 1 g capsule, Take 3 g by mouth daily. , Disp: , Rfl:  .  omeprazole (  PRILOSEC) 20 MG capsule, TAKE 1 CAPSULE TWICE DAILY, Disp: 180 capsule, Rfl: 1 .  pyridoxine (B-6) 100 MG tablet, Take 100 mg by mouth daily., Disp: , Rfl:  .  vitamin C (ASCORBIC ACID) 500 MG tablet, Take 1,000 mg by mouth daily., Disp: , Rfl:  .  vitamin E 400 UNIT capsule, Take 400 Units by mouth daily., Disp: , Rfl:  .  cephALEXin (KEFLEX) 500 MG capsule, Take 1 capsule (500 mg total) by mouth 2 (two) times daily., Disp: 14 capsule, Rfl: 0  Review of Systems  Constitutional: Negative.   Skin: Positive for wound. Negative for color change, pallor and rash.    Social History  Substance Use Topics  . Smoking status: Never Smoker  . Smokeless tobacco: Never Used  . Alcohol use No   Objective:   BP 124/78 (BP Location: Right Arm, Patient Position: Sitting, Cuff  Size: Large)   Pulse 76   Temp 98.5 F (36.9 C) (Oral)   Resp 16   Wt 214 lb (97.1 kg)   BMI 33.52 kg/m  Vitals:   12/08/16 1435  BP: 124/78  Pulse: 76  Resp: 16  Temp: 98.5 F (36.9 C)  TempSrc: Oral  Weight: 214 lb (97.1 kg)     Physical Exam  Constitutional: She is oriented to person, place, and time. She appears well-developed and well-nourished.  Neurological: She is alert and oriented to person, place, and time. No sensory deficit.  Reflex Scores:      Patellar reflexes are 2+ on the right side and 2+ on the left side.      Achilles reflexes are 2+ on the right side and 2+ on the left side. Skin: Skin is warm and dry. There is erythema.  There are two 1 cm in diameter circular hyperpigmented lesions on the dorsum of her left foot. No fluctuance, no purulent drainage. Some erythema surrounding this.   Psychiatric: She has a normal mood and affect. Her behavior is normal.        Assessment & Plan:     1. Cellulitis of left lower extremity  Will cover as below.  - cephALEXin (KEFLEX) 500 MG capsule; Take 1 capsule (500 mg total) by mouth 2 (two) times daily.  Dispense: 14 capsule; Refill: 0  2. Type 2 diabetes mellitus with complication, without long-term current use of insulin (HCC)  Stable. Please wear hard soled shoes at all times.  3. Blister  Healing.  Return if symptoms worsen or fail to improve.  The entirety of the information documented in the History of Present Illness, Review of Systems and Physical Exam were personally obtained by me. Portions of this information were initially documented by Ashley Royalty, CMA and reviewed by me for thoroughness and accuracy.        Trinna Post, PA-C  [facility]

## 2016-12-13 ENCOUNTER — Ambulatory Visit (INDEPENDENT_AMBULATORY_CARE_PROVIDER_SITE_OTHER): Payer: Medicare HMO | Admitting: General Surgery

## 2016-12-13 ENCOUNTER — Encounter: Payer: Self-pay | Admitting: General Surgery

## 2016-12-13 VITALS — BP 130/68 | HR 81 | Resp 14 | Ht 69.0 in | Wt 210.0 lb

## 2016-12-13 DIAGNOSIS — C50412 Malignant neoplasm of upper-outer quadrant of left female breast: Secondary | ICD-10-CM

## 2016-12-13 DIAGNOSIS — Z170 Estrogen receptor positive status [ER+]: Secondary | ICD-10-CM

## 2016-12-13 DIAGNOSIS — C50211 Malignant neoplasm of upper-inner quadrant of right female breast: Secondary | ICD-10-CM | POA: Diagnosis not present

## 2016-12-13 NOTE — Progress Notes (Signed)
Patient ID: Leah Schaefer, female   DOB: 1941/07/23, 76 y.o.   MRN: [id number]  Chief Complaint  Patient presents with  . Follow-up    HPI Leah Schaefer is a 76 y.o. female who presents for a breast evaluation. The most recent mammogram was done on 12/08/2016.  Patient does perform regular self breast checks and gets regular mammograms done.  Patient states she noticed two blister on her left foot last week.  HPI  Past Medical History:  Diagnosis Date  . Breast cancer ([place])   . 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  . Cystitis   . Diabetes mellitus without complication (HCC)    NO MEDS-DIET CONTROLLED  . GERD (gastroesophageal reflux disease)   . Hernia 2011  . Hypertension   . [place]   Left, T2 (2.3 cm) N0, triple negative. Chemotherapy/post wide excision whole breast radiation.  . 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 Personal history of chemotherapy 2013  . Personal history of malignant neoplasm of breast   . Personal history of radiation therapy 2017    Rt  . Vertigo     Past Surgical History:  Procedure Laterality Date  . BREAST BIOPSY Left 2013   +  . BREAST EXCISIONAL BIOPSY Right 2017   +  . BREAST LUMPECTOMY WITH SENTINEL LYMPH NODE BIOPSY Right 09/30/2015   Procedure: BREAST LUMPECTOMY WITH SENTINEL LYMPH NODE BX;  Surgeon: JRobert Bellow MD;  Location: [facility] ORS;  Service: General;  Laterality: Right;  . BREAST SURGERY Left 2012   wide excision  . BREAST SURGERY Left November 2013   Core biopsy of the upper-outer quadrant showed fat necrosis.  . CHOLECYSTECTOMY  2007  . COLONOSCOPY  2011   Dr OCandace Cruise . COLONOSCOPY WITH PROPOFOL N/A 02/25/2015   Procedure: COLONOSCOPY WITH PROPOFOL;  Surgeon: PHulen Luster MD;  [facility];  Service:  Gastroenterology;  Laterality: N/A;  . DILATION AND CURETTAGE OF UTERUS  2004  . HERNIA REPAIR Right 2009   inguinal, and umbilical  . PORT-A-CATH REMOVAL    . PORTACATH PLACEMENT  2013  . UPPER GI ENDOSCOPY  2011    Family History  Problem Relation Age of Onset  . Lymphoma Father   . Ovarian cancer Sister   . Colon cancer Brother   . Lymphoma Brother   . Cancer Other        stomach  . Cancer Other        stomach  . Breast cancer Neg Hx     Social History Social History  Substance Use Topics  . Smoking status: Never Smoker  . Smokeless tobacco: Never Used  . Alcohol use No    No Known Allergies  Current Outpatient Prescriptions  Medication Sig Dispense Refill  . ACCU-CHEK FASTCLIX LANCETS MISC CHECK BLOOD SUGAR ONE TIME DAILY 102 each 3  . ACCU-CHEK SMARTVIEW test strip CHECK BLOOD SUGAR ONCE A DAY. 100 each 3  . acetaminophen (TYLENOL) 500 MG tablet Take 500 mg by mouth every 6 (six) hours as needed for mild pain or headache.     .Marland Kitchenacyclovir (ZOVIRAX) 200 MG capsule TAKE 1 CAPSULE EVERY DAY 90 capsule 3  . Alcohol Swabs (B-D SINGLE USE SWABS REGULAR) PADS CHECK BLOOD SUGAR ONE TIME DAILY 100 each 3  . aspirin  EC 81 MG tablet Take 81 mg by mouth daily.    Marland Kitchen atorvastatin (LIPITOR) 10 MG tablet TAKE 1 TABLET EVERY EVENING 90 tablet 3  . bimatoprost (LUMIGAN) 0.01 % SOLN Place 1 drop into both eyes at bedtime.     . Blood Glucose Calibration (ACCU-CHEK SMARTVIEW CONTROL) LIQD     . Calcium Carb-Cholecalciferol (CALCIUM 600+D3) 600-800 MG-UNIT TABS Take 1 tablet by mouth 2 (two) times daily.    . cholecalciferol (VITAMIN D) 1000 units tablet Take 1,000 Units by mouth daily.    . cyanocobalamin 500 MCG tablet Take 500 mcg by mouth daily.    Marland Kitchen escitalopram (LEXAPRO) 20 MG tablet TAKE 1 TABLET EVERY DAY 90 tablet 3  . gabapentin (NEURONTIN) 300 MG capsule Take 2 capsules (600 mg total) by mouth at bedtime. 180 capsule 3  . latanoprost (XALATAN) 0.005 % ophthalmic solution  Place 1 drop into both eyes at bedtime.     Marland Kitchen letrozole (FEMARA) 2.5 MG tablet TAKE ONE TABLET BY MOUTH ONCE DAILY 30 tablet 11  . meclizine (ANTIVERT) 25 MG tablet Take 0.5 tablets (12.5 mg total) by mouth 3 (three) times daily as needed for dizziness. 15 tablet 0  . meloxicam (MOBIC) 15 MG tablet TAKE 1 TABLET EVERY DAY 90 tablet 3  . metoprolol tartrate (LOPRESSOR) 25 MG tablet TAKE 1/2 TABLET TWICE DAILY 90 tablet 3  . Multiple Vitamin (MULTIVITAMIN WITH MINERALS) TABS tablet Take 1 tablet by mouth daily.    Marland Kitchen omega-3 acid ethyl esters (LOVAZA) 1 g capsule Take 3 g by mouth daily.     Marland Kitchen omeprazole (PRILOSEC) 20 MG capsule TAKE 1 CAPSULE TWICE DAILY 180 capsule 1  . pyridoxine (B-6) 100 MG tablet Take 100 mg by mouth daily.    . vitamin C (ASCORBIC ACID) 500 MG tablet Take 1,000 mg by mouth daily.    . vitamin E 400 UNIT capsule Take 400 Units by mouth daily.     No current facility-administered medications for this visit.     Review of Systems Review of Systems  Constitutional: Negative.   Respiratory: Negative.   Cardiovascular: Negative.     Blood pressure 130/68, pulse 81, resp. rate 14, height 5' 9" (1.753 m), weight 210 lb (95.3 kg).  Physical Exam Physical Exam  Constitutional: She is oriented to person, place, and time. She appears well-developed and well-nourished.  Eyes: Conjunctivae are normal. No scleral icterus.  Neck: Neck supple.  Cardiovascular: Normal rate, regular rhythm and normal heart sounds.   Pulmonary/Chest: Effort normal and breath sounds normal. Right breast exhibits no inverted nipple, no mass, no nipple discharge, no skin change and no tenderness. Left breast exhibits no inverted nipple, no mass, no nipple discharge, no skin change and no tenderness.    Good volume preservation and symmetry.  Lymphadenopathy:    She has no cervical adenopathy.    She has no axillary adenopathy.  Neurological: She is alert and oriented to person, place, and time.   Skin: Skin is warm and dry.    Data Reviewed Bilateral mammograms of 12/08/2016 were reviewed. No interval change. BI-RADS-2.  Assessment    No evidence of recurrent disease.  Good tolerance of antiestrogen therapy.    Plan      The patient has been asked to return to the office in one year with a bilateral diagnostic mammogram. The patient is aware to call back for any questions or concerns.   HPI, Physical Exam, Assessment and Plan have been scribed under the  direction and in the presence of Hervey Ard, MD.  Gaspar Cola, CMA  I have completed the exam and reviewed the above documentation for accuracy and completeness.  I agree with the above.  Haematologist has been used and any errors in dictation or transcription are unintentional.  Hervey Ard, M.D., F.A.C.S.   Robert Bellow 12/16/2016, 3:58 PM

## 2016-12-13 NOTE — Patient Instructions (Signed)
The patient has been asked to return to the office in one year with a bilateral diagnostic mammogram.The patient is aware to call back for any questions or concerns. 

## 2017-01-24 DIAGNOSIS — I10 Essential (primary) hypertension: Secondary | ICD-10-CM | POA: Diagnosis not present

## 2017-01-24 DIAGNOSIS — E7800 Pure hypercholesterolemia, unspecified: Secondary | ICD-10-CM | POA: Diagnosis not present

## 2017-01-25 LAB — LIPID PANEL WITH LDL/HDL RATIO
CHOLESTEROL TOTAL: 118 mg/dL (ref 100–199)
HDL: 37 mg/dL — ABNORMAL LOW (ref >39)
LDL CALC: 62 mg/dL (ref 0–99)
LDL/HDL RATIO: 1.7 ratio (ref 0.0–3.2)
Triglycerides: 96 mg/dL (ref 0–149)
VLDL Cholesterol Cal: 19 mg/dL (ref 5–40)

## 2017-01-25 LAB — CBC WITH DIFFERENTIAL/PLATELET
BASOS ABS: 0 10*3/uL (ref 0.0–0.2)
Basos: 1 %
EOS (ABSOLUTE): 0.2 10*3/uL (ref 0.0–0.4)
Eos: 3 %
HEMOGLOBIN: 15.4 g/dL (ref 11.1–15.9)
Hematocrit: 45.5 % (ref 34.0–46.6)
Immature Grans (Abs): 0 10*3/uL (ref 0.0–0.1)
Immature Granulocytes: 0 %
LYMPHS ABS: 2 10*3/uL (ref 0.7–3.1)
Lymphs: 43 %
MCH: 30.6 pg (ref 26.6–33.0)
MCHC: 33.8 g/dL (ref 31.5–35.7)
MCV: 90 fL (ref 79–97)
MONOCYTES: 7 %
Monocytes Absolute: 0.3 10*3/uL (ref 0.1–0.9)
NEUTROS ABS: 2.2 10*3/uL (ref 1.4–7.0)
Neutrophils: 46 %
Platelets: 252 10*3/uL (ref 150–379)
RBC: 5.04 x10E6/uL (ref 3.77–5.28)
RDW: 13.6 % (ref 12.3–15.4)
WBC: 4.8 10*3/uL (ref 3.4–10.8)

## 2017-01-25 LAB — TSH: TSH: 1.91 u[IU]/mL (ref 0.450–4.500)

## 2017-01-25 LAB — COMPREHENSIVE METABOLIC PANEL
ALBUMIN: 4.5 g/dL (ref 3.5–4.8)
ALK PHOS: 89 IU/L (ref 39–117)
ALT: 23 IU/L (ref 0–32)
AST: 27 IU/L (ref 0–40)
Albumin/Globulin Ratio: 1.6 (ref 1.2–2.2)
BUN/Creatinine Ratio: 14 (ref 12–28)
BUN: 11 mg/dL (ref 8–27)
Bilirubin Total: 0.3 mg/dL (ref 0.0–1.2)
CO2: 21 mmol/L (ref 20–29)
CREATININE: 0.78 mg/dL (ref 0.57–1.00)
Calcium: 10.3 mg/dL (ref 8.7–10.3)
Chloride: 102 mmol/L (ref 96–106)
GFR calc Af Amer: 87 mL/min/{1.73_m2} (ref >59)
GFR calc non Af Amer: 75 mL/min/{1.73_m2} (ref >59)
Globulin, Total: 2.9 g/dL (ref 1.5–4.5)
Glucose: 154 mg/dL — ABNORMAL HIGH (ref 65–99)
Potassium: 4.6 mmol/L (ref 3.5–5.2)
Sodium: 143 mmol/L (ref 134–144)
Total Protein: 7.4 g/dL (ref 6.0–8.5)

## 2017-01-26 NOTE — Progress Notes (Signed)
Advised  ED 

## 2017-02-15 DIAGNOSIS — H40153 Residual stage of open-angle glaucoma, bilateral: Secondary | ICD-10-CM | POA: Diagnosis not present

## 2017-02-20 ENCOUNTER — Ambulatory Visit (INDEPENDENT_AMBULATORY_CARE_PROVIDER_SITE_OTHER): Payer: Medicare HMO | Admitting: Family Medicine

## 2017-02-20 VITALS — BP 112/80 | HR 74 | Temp 98.6°F | Resp 14 | Wt 211.0 lb

## 2017-02-20 DIAGNOSIS — F4322 Adjustment disorder with anxiety: Secondary | ICD-10-CM | POA: Diagnosis not present

## 2017-02-20 DIAGNOSIS — Z170 Estrogen receptor positive status [ER+]: Secondary | ICD-10-CM

## 2017-02-20 DIAGNOSIS — C50411 Malignant neoplasm of upper-outer quadrant of right female breast: Secondary | ICD-10-CM | POA: Diagnosis not present

## 2017-02-20 DIAGNOSIS — E118 Type 2 diabetes mellitus with unspecified complications: Secondary | ICD-10-CM

## 2017-02-20 DIAGNOSIS — F419 Anxiety disorder, unspecified: Secondary | ICD-10-CM

## 2017-02-20 MED ORDER — ESCITALOPRAM OXALATE 10 MG PO TABS: 10.0000 mg | ORAL_TABLET | Freq: Every day | ORAL | 12 refills | Status: DC

## 2017-02-20 MED ORDER — METFORMIN HCL 500 MG PO TABS: 500.0000 mg | ORAL_TABLET | Freq: Two times a day (BID) | ORAL | 0 refills | Status: DC

## 2017-02-20 NOTE — Progress Notes (Signed)
Leah Schaefer  MRN: [id number] DOB: 01-Mar-1941  Subjective:  HPI  Patient is here to discuss anxiety. She has something going in her life that is causing her to feel anxious and she tried to deal with this on her own but it is not working and would like to get help with this. Per notes in the past patient has been on Xanax and Lexapro and these medications were stopped due to her feeling better and has been feeling better until now. Depression screen [facility] 2/9 02/20/2017 12/06/2016 12/06/2016  Decreased Interest 1 0 0  Down, Depressed, Hopeless 1 0 0  PHQ - 2 Score 2 0 0  Altered sleeping 1 0 -  Tired, decreased energy 1 0 -  Change in appetite 2 0 -  Feeling bad or failure about yourself  0 0 -  Trouble concentrating 0 0 -  Moving slowly or fidgety/restless 0 0 -  Suicidal thoughts 0 0 -  PHQ-9 Score 6 0 -   Patient also wants to discuss her diabetes. She states we suggested to try Metformin but she was hesitant to start this medication and is ready now. Her sugar readings at home have been up and down. Too early to check A1C today. Lab Results  Component Value Date   HGBA1C 7.8 12/06/2016    Patient Active Problem List   Diagnosis Date Noted  . Breast mass, left 04/07/2016  . Controlled type 2 diabetes mellitus without complication ([place]) 79/15/0569  . Allergic rhinitis 03/30/2015  . Anxiety 03/30/2015  . Carotid arterial disease ([place]) 03/30/2015  . Diabetes mellitus, type 2 ([place]) 03/30/2015  . Essential (primary) hypertension 03/30/2015  . Hypercholesteremia 03/30/2015  . Left leg pain 03/30/2015  . Adenopathy 03/30/2015  . LBP (low back pain) 03/30/2015  . Neuropathic pain 03/30/2015  . Burning or prickling sensation 03/30/2015  . Neuralgia neuritis, sciatic nerve 03/30/2015  . Skin cyst 11/24/2014  . Carcinoma of upper-outer quadrant of right breast in female, estrogen receptor positive ([place]) 08/17/2011  . Allergic reaction 11/29/2009  . Adaptation reaction 04/30/2009    . Acid reflux 04/30/2009  . Stricture and stenosis of cervix 01/28/2009    Past Medical History:  Diagnosis Date  . Breast cancer ([place])   . Breast cancer of upper-inner quadrant of right female breast ([place]) 09/21/2015   T1bN0 " 8m; ER100%, PR 90%, HER-2/neu not overexpressed.  . Cystitis   . Diabetes mellitus without complication (HCC)    NO MEDS-DIET CONTROLLED  . GERD (gastroesophageal reflux disease)   . Hernia 2011  . Hypertension   . [place] 08/17[place]   Left, T2 (2.3 cm) N0, triple negative. Chemotherapy/post wide excision whole breast radiation.  . 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 Personal history of chemotherapy 2013  . Personal history of malignant neoplasm of breast   . Personal history of radiation therapy 2017    Rt  . Vertigo     Social History   Social History  . Marital status: Married    Spouse name: N/A  . Number of children: 2  . Years of education: H/S   Occupational History  . Retired    Social History Main Topics  . Smoking status: Never Smoker  . Smokeless tobacco: Never Used  . Alcohol use No  . Drug use: No  . Sexual activity: Not on file   Other Topics Concern  . Not on  file   Social History Narrative  . No narrative on file    Outpatient Encounter Prescriptions as of 02/20/2017  Medication Sig Note  . ACCU-CHEK FASTCLIX LANCETS MISC CHECK BLOOD SUGAR ONE TIME DAILY   . ACCU-CHEK SMARTVIEW test strip CHECK BLOOD SUGAR ONCE A DAY.   Marland Kitchen acetaminophen (TYLENOL) 500 MG tablet Take 500 mg by mouth every 6 (six) hours as needed for mild pain or headache.    Marland Kitchen acyclovir (ZOVIRAX) 200 MG capsule TAKE 1 CAPSULE EVERY DAY   . Alcohol Swabs (B-D SINGLE USE SWABS REGULAR) PADS CHECK BLOOD SUGAR ONE TIME DAILY   . aspirin EC 81 MG tablet Take 81 mg by mouth daily.   Marland Kitchen atorvastatin (LIPITOR) 10 MG tablet TAKE 1 TABLET EVERY EVENING   . bimatoprost  (LUMIGAN) 0.01 % SOLN Place 1 drop into both eyes at bedtime.    . Blood Glucose Calibration (ACCU-CHEK SMARTVIEW CONTROL) LIQD  01/25/2016: Received from: External Pharmacy  . Calcium Carb-Cholecalciferol (CALCIUM 600+D3) 600-800 MG-UNIT TABS Take 1 tablet by mouth 2 (two) times daily.   . cholecalciferol (VITAMIN D) 1000 units tablet Take 1,000 Units by mouth daily.   . cyanocobalamin 500 MCG tablet Take 500 mcg by mouth daily.   Marland Kitchen escitalopram (LEXAPRO) 20 MG tablet TAKE 1 TABLET EVERY DAY   . gabapentin (NEURONTIN) 300 MG capsule Take 2 capsules (600 mg total) by mouth at bedtime.   Marland Kitchen latanoprost (XALATAN) 0.005 % ophthalmic solution Place 1 drop into both eyes at bedtime.    Marland Kitchen letrozole (FEMARA) 2.5 MG tablet TAKE ONE TABLET BY MOUTH ONCE DAILY   . meclizine (ANTIVERT) 25 MG tablet Take 0.5 tablets (12.5 mg total) by mouth 3 (three) times daily as needed for dizziness.   . meloxicam (MOBIC) 15 MG tablet TAKE 1 TABLET EVERY DAY   . metoprolol tartrate (LOPRESSOR) 25 MG tablet TAKE 1/2 TABLET TWICE DAILY   . Multiple Vitamin (MULTIVITAMIN WITH MINERALS) TABS tablet Take 1 tablet by mouth daily.   Marland Kitchen omega-3 acid ethyl esters (LOVAZA) 1 g capsule Take 3 g by mouth daily.    Marland Kitchen omeprazole (PRILOSEC) 20 MG capsule TAKE 1 CAPSULE TWICE DAILY   . pyridoxine (B-6) 100 MG tablet Take 100 mg by mouth daily.   . vitamin C (ASCORBIC ACID) 500 MG tablet Take 1,000 mg by mouth daily.   . vitamin E 400 UNIT capsule Take 400 Units by mouth daily.    No facility-administered encounter medications on file as of 02/20/2017.     No Known Allergies  Review of Systems  Constitutional: Positive for malaise/fatigue.  Respiratory: Negative.   Cardiovascular: Negative.   Gastrointestinal: Negative.   Psychiatric/Behavioral: Positive for depression. The patient is nervous/anxious.     Objective:  BP 112/80   Pulse 74   Temp 98.6 F (37 C)   Resp 14   Wt 211 lb (95.7 kg)   BMI 31.16 kg/m   Physical  Exam  Assessment and Plan :  1. Type 2 diabetes mellitus with complication, without long-term current use of insulin ([place])  2. Anxiety Start Lexapro 10 mg and re check in 1 month.  - escitalopram (LEXAPRO) 10 MG tablet; Take 1 tablet (10 mg total) by mouth daily.  Dispense: 30 tablet; Refill: 12  3. Carcinoma of upper-outer quadrant of right breast in female, estrogen receptor positive ([place])  HPI, Exam and A&P transcribed by Theressa Millard, RMA under direction and in the presence of [name], MD. I have done  the exam and reviewed the chart and it is accurate to the best of my knowledge. Development worker, community has been used and  any errors in dictation or transcription are unintentional. [name] M.D. [facility]

## 2017-02-21 ENCOUNTER — Telehealth: Payer: Self-pay | Admitting: Family Medicine

## 2017-02-21 NOTE — Telephone Encounter (Signed)
Yes please try immodium OTC for diarrhea. Taking metformin with food may help as well.

## 2017-02-21 NOTE — Telephone Encounter (Signed)
Pt just started the Metformin yesterday. She knows that she should try to push through to see if it gets better but she can hardly go out the door and she has errands to run. She is wanting to know if she can take something for diarrhea. Please advise, since Dr. Darnell Level is out of the office thanks.

## 2017-02-21 NOTE — Telephone Encounter (Signed)
Pt called saying the metformin is causing her to have diarrhea   Please advise  [phone]  Thanks teri

## 2017-02-21 NOTE — Telephone Encounter (Signed)
Pt informed and voiced understanding of results. 

## 2017-03-22 ENCOUNTER — Encounter: Payer: Self-pay | Admitting: Family Medicine

## 2017-03-22 ENCOUNTER — Ambulatory Visit (INDEPENDENT_AMBULATORY_CARE_PROVIDER_SITE_OTHER): Payer: Medicare HMO | Admitting: Family Medicine

## 2017-03-22 VITALS — BP 116/82 | HR 73 | Temp 98.0°F

## 2017-03-22 DIAGNOSIS — E118 Type 2 diabetes mellitus with unspecified complications: Secondary | ICD-10-CM | POA: Diagnosis not present

## 2017-03-22 DIAGNOSIS — F419 Anxiety disorder, unspecified: Secondary | ICD-10-CM | POA: Diagnosis not present

## 2017-03-22 LAB — HM DIABETES FOOT EXAM: HM Diabetic Foot Exam: NORMAL

## 2017-03-22 LAB — POCT GLYCOSYLATED HEMOGLOBIN (HGB A1C): Hemoglobin A1C: 7.6

## 2017-03-22 NOTE — Progress Notes (Addendum)
Patient: Leah Schaefer Female    DOB: [birth year]   76 y.o.   MRN: [id number] Visit Date: 03/22/2017  Today's Provider: [name], MD   Chief Complaint  Patient presents with  . Anxiety  . Diabetes  . Follow-up   Subjective:    HPI  Anxiety Follow Up:  Patient was last seen for this on 02/20/2017. Started Lexopro 10 mg daily. Patient reports good compliance with treatment plan. Symptoms have improved. Patient denies symptoms at this time.  Diabetes Follow Up:  Patient last seen for this on 02/20/2017. Patient states at that time she was ready to start Metformin. A1C was not done at that time. Patient reports she started Metformin on 7/17. She was experiencing diarrhea which has subsided. She is checking FBS at home. FBS this morning 136. The highest reading she had since starting Metformin was 150. Patient does not have any concerns at this time.  Lab Results  Component Value Date   HGBA1C 7.6 03/22/2017   HGBA1C 7.8 12/06/2016   HGBA1C 7.7 08/08/2016   Lab Results  Component Value Date   MICROALBUR Negative 03/30/2015   LDLCALC 62 01/24/2017   CREATININE 0.78 01/24/2017      Previous Medications   ACCU-CHEK FASTCLIX LANCETS MISC    CHECK BLOOD SUGAR ONE TIME DAILY   ACCU-CHEK SMARTVIEW TEST STRIP    CHECK BLOOD SUGAR ONCE A DAY.   ACETAMINOPHEN (TYLENOL) 500 MG TABLET    Take 500 mg by mouth every 6 (six) hours as needed for mild pain or headache.    ACYCLOVIR (ZOVIRAX) 200 MG CAPSULE    TAKE 1 CAPSULE EVERY DAY   ALCOHOL SWABS (B-D SINGLE USE SWABS REGULAR) PADS    CHECK BLOOD SUGAR ONE TIME DAILY   ASPIRIN EC 81 MG TABLET    Take 81 mg by mouth daily.   ATORVASTATIN (LIPITOR) 10 MG TABLET    TAKE 1 TABLET EVERY EVENING   BIMATOPROST (LUMIGAN) 0.01 % SOLN    Place 1 drop into both eyes at bedtime.    BLOOD GLUCOSE CALIBRATION (ACCU-CHEK SMARTVIEW CONTROL) LIQD       CALCIUM CARB-CHOLECALCIFEROL (CALCIUM 600+D3) 600-800 MG-UNIT TABS    Take 1 tablet by mouth 2  (two) times daily.   CHOLECALCIFEROL (VITAMIN D) 1000 UNITS TABLET    Take 1,000 Units by mouth daily.   CYANOCOBALAMIN 500 MCG TABLET    Take 500 mcg by mouth daily.   ESCITALOPRAM (LEXAPRO) 10 MG TABLET    Take 1 tablet (10 mg total) by mouth daily.   GABAPENTIN (NEURONTIN) 300 MG CAPSULE    Take 2 capsules (600 mg total) by mouth at bedtime.   LATANOPROST (XALATAN) 0.005 % OPHTHALMIC SOLUTION    Place 1 drop into both eyes at bedtime.    LETROZOLE (FEMARA) 2.5 MG TABLET    TAKE ONE TABLET BY MOUTH ONCE DAILY   MECLIZINE (ANTIVERT) 25 MG TABLET    Take 0.5 tablets (12.5 mg total) by mouth 3 (three) times daily as needed for dizziness.   MELOXICAM (MOBIC) 15 MG TABLET    TAKE 1 TABLET EVERY DAY   METFORMIN (GLUCOPHAGE) 500 MG TABLET    Take 1 tablet (500 mg total) by mouth 2 (two) times daily with a meal.   METOPROLOL TARTRATE (LOPRESSOR) 25 MG TABLET    TAKE 1/2 TABLET TWICE DAILY   MULTIPLE VITAMIN (MULTIVITAMIN WITH MINERALS) TABS TABLET    Take 1 tablet by mouth daily.   OMEGA-3 ACID  ETHYL ESTERS (LOVAZA) 1 G CAPSULE    Take 3 g by mouth daily.    OMEPRAZOLE (PRILOSEC) 20 MG CAPSULE    TAKE 1 CAPSULE TWICE DAILY   PYRIDOXINE (B-6) 100 MG TABLET    Take 100 mg by mouth daily.   VITAMIN C (ASCORBIC ACID) 500 MG TABLET    Take 1,000 mg by mouth daily.   VITAMIN E 400 UNIT CAPSULE    Take 400 Units by mouth daily.    Review of Systems  Constitutional: Negative.   HENT: Negative.   Eyes: Negative.   Respiratory: Negative.   Cardiovascular: Negative.   Gastrointestinal: Negative.   Endocrine: Negative.   Allergic/Immunologic: Negative.   Hematological: Negative.   Psychiatric/Behavioral: The patient is nervous/anxious.     Social History  Substance Use Topics  . Smoking status: Never Smoker  . Smokeless tobacco: Never Used  . Alcohol use No   Objective:   BP 116/82 (BP Location: Right Arm, Patient Position: Sitting, Cuff Size: Normal)   Pulse 73   Temp 98 F (36.7 C) (Oral)    SpO2 97%   Physical Exam  Constitutional: She is oriented to person, place, and time. She appears well-developed and well-nourished.  HENT:  Head: Normocephalic and atraumatic.  Right Ear: External ear normal.  Left Ear: External ear normal.  Mouth/Throat: Oropharynx is clear and moist.  Eyes: Pupils are equal, round, and reactive to light. Conjunctivae and EOM are normal.  Neck: Normal range of motion. Neck supple.  Cardiovascular: Normal rate, regular rhythm, normal heart sounds and intact distal pulses.   Pulmonary/Chest: Effort normal and breath sounds normal.  Abdominal: Soft. Bowel sounds are normal.  Musculoskeletal: Normal range of motion.  Neurological: She is alert and oriented to person, place, and time. She has normal reflexes.  Skin: Skin is warm and dry.  Psychiatric: She has a normal mood and affect. Her behavior is normal. Judgment and thought content normal.        Assessment & Plan:     1. Type 2 diabetes mellitus with complication, without long-term current use of insulin (HCC) Continue Metformin and check blood sugar each morning. Normal diabetic foot exam today. . Canceled microalbumin test patient was unable to urinate today. Follow up in 3 months  - POCT HgB A1C--7.6 today.  2. Anxiety Symptoms stable. Continue Lexapro 10 mg. Follow up in 3 months    Follow up: Return in about 3 months (around 06/22/2017) for 3 month follow up .     HPI, Exam and A&P transcribed by Tiburcio Pea CMA under direction and in the presence of [name], MD. I have done the exam and reviewed the chart and it is accurate to the best of my knowledge. Development worker, community has been used and  any errors in dictation or transcription are unintentional. [name] M.D. [facility]

## 2017-04-10 ENCOUNTER — Ambulatory Visit: Payer: Medicare HMO | Admitting: Family Medicine

## 2017-04-13 ENCOUNTER — Inpatient Hospital Stay: Payer: Medicare HMO | Attending: Internal Medicine

## 2017-04-13 ENCOUNTER — Inpatient Hospital Stay (HOSPITAL_BASED_OUTPATIENT_CLINIC_OR_DEPARTMENT_OTHER): Payer: Medicare HMO | Admitting: Internal Medicine

## 2017-04-13 DIAGNOSIS — Z7984 Long term (current) use of oral hypoglycemic drugs: Secondary | ICD-10-CM

## 2017-04-13 DIAGNOSIS — K219 Gastro-esophageal reflux disease without esophagitis: Secondary | ICD-10-CM | POA: Insufficient documentation

## 2017-04-13 DIAGNOSIS — Z7982 Long term (current) use of aspirin: Secondary | ICD-10-CM

## 2017-04-13 DIAGNOSIS — R42 Dizziness and giddiness: Secondary | ICD-10-CM

## 2017-04-13 DIAGNOSIS — I10 Essential (primary) hypertension: Secondary | ICD-10-CM | POA: Insufficient documentation

## 2017-04-13 DIAGNOSIS — Z170 Estrogen receptor positive status [ER+]: Secondary | ICD-10-CM | POA: Diagnosis not present

## 2017-04-13 DIAGNOSIS — Z79811 Long term (current) use of aromatase inhibitors: Secondary | ICD-10-CM

## 2017-04-13 DIAGNOSIS — Z8041 Family history of malignant neoplasm of ovary: Secondary | ICD-10-CM

## 2017-04-13 DIAGNOSIS — C50411 Malignant neoplasm of upper-outer quadrant of right female breast: Secondary | ICD-10-CM | POA: Insufficient documentation

## 2017-04-13 DIAGNOSIS — Z79899 Other long term (current) drug therapy: Secondary | ICD-10-CM | POA: Insufficient documentation

## 2017-04-13 DIAGNOSIS — M129 Arthropathy, unspecified: Secondary | ICD-10-CM | POA: Diagnosis not present

## 2017-04-13 DIAGNOSIS — Z923 Personal history of irradiation: Secondary | ICD-10-CM | POA: Insufficient documentation

## 2017-04-13 DIAGNOSIS — Z9221 Personal history of antineoplastic chemotherapy: Secondary | ICD-10-CM | POA: Diagnosis not present

## 2017-04-13 DIAGNOSIS — Z800 Family history of malignant neoplasm of digestive organs: Secondary | ICD-10-CM | POA: Insufficient documentation

## 2017-04-13 DIAGNOSIS — R232 Flushing: Secondary | ICD-10-CM

## 2017-04-13 DIAGNOSIS — E119 Type 2 diabetes mellitus without complications: Secondary | ICD-10-CM

## 2017-04-13 LAB — COMPREHENSIVE METABOLIC PANEL
ALT: 22 U/L (ref 14–54)
AST: 25 U/L (ref 15–41)
Albumin: 4.1 g/dL (ref 3.5–5.0)
Alkaline Phosphatase: 66 U/L (ref 38–126)
Anion gap: 9 (ref 5–15)
BUN: 12 mg/dL (ref 6–20)
CHLORIDE: 101 mmol/L (ref 101–111)
CO2: 26 mmol/L (ref 22–32)
CREATININE: 0.87 mg/dL (ref 0.44–1.00)
Calcium: 9.5 mg/dL (ref 8.9–10.3)
GFR calc Af Amer: >60 mL/min (ref >60)
GFR calc non Af Amer: >60 mL/min (ref >60)
Glucose, Bld: 155 mg/dL — ABNORMAL HIGH (ref 65–99)
Potassium: 3.9 mmol/L (ref 3.5–5.1)
SODIUM: 136 mmol/L (ref 135–145)
Total Bilirubin: 0.4 mg/dL (ref 0.3–1.2)
Total Protein: 7.6 g/dL (ref 6.5–8.1)

## 2017-04-13 LAB — CBC WITH DIFFERENTIAL/PLATELET
Basophils Absolute: 0 10*3/uL (ref 0–0.1)
Basophils Relative: 1 %
EOS ABS: 0.2 10*3/uL (ref 0–0.7)
EOS PCT: 3 %
HCT: 42.9 % (ref 35.0–47.0)
HEMOGLOBIN: 15 g/dL (ref 12.0–16.0)
LYMPHS ABS: 2.2 10*3/uL (ref 1.0–3.6)
Lymphocytes Relative: 38 %
MCH: 31 pg (ref 26.0–34.0)
MCHC: 34.9 g/dL (ref 32.0–36.0)
MCV: 88.8 fL (ref 80.0–100.0)
MONOS PCT: 8 %
Monocytes Absolute: 0.4 10*3/uL (ref 0.2–0.9)
NEUTROS PCT: 50 %
Neutro Abs: 3 10*3/uL (ref 1.4–6.5)
Platelets: 241 10*3/uL (ref 150–440)
RBC: 4.84 MIL/uL (ref 3.80–5.20)
RDW: 13 % (ref 11.5–14.5)
WBC: 5.8 10*3/uL (ref 3.6–11.0)

## 2017-04-13 NOTE — Progress Notes (Signed)
[place] OFFICE PROGRESS NOTE  Patient Care Team: Jerrol Banana., MD as PCP - General (Family Medicine) Bary Castilla, Forest Gleason, MD (General Surgery) Cammie Sickle, MD as Consulting Physician (Internal Medicine) Lorelee Cover., MD as Consulting Physician (Ophthalmology) Noreene Filbert, MD as Referring Physician (Radiation Oncology)   SUMMARY OF HEMATOLOGIC/ONCOLOGIC HISTORY:  Oncology History   # FEB 2017- RIGHT BREAST  STAGE I [T1b N0 M0]. ER/PR ER positive 100% PR positive 95% HER 2 FISH negative;  INVASIVE MAMMARY CARCINOMA WITH MICROPAPILLARY FEATURES. S/p Lumpec [Dr.Byrnett] & RT; No Oncotype; Femara  # 2013- LEFT BREAST- STAGE II; ER/PR/ Her 2 NEG s/p Lumpec [Dr.Byrnett] & RT  # BMD- April  2017- wnl; genetic testing- declined [May 2017]  # Vertigo [uses rolling walker]     Carcinoma of upper-outer quadrant of right breast in female, estrogen receptor positive ([place])     INTERVAL HISTORY:   A very pleasant 76 year old African-American female patient with above history of breast cancer most recently diagnosed in February 2017 is here for follow-up; patient currently on Femara and doing well.  Patient noted to have pain in her hand joints- most in the mornings. Her appetite is good. Denies any weight loss. No chest pain or shortness of breath or cough.Chronic mild hot flashes. Not any worse.   REVIEW OF SYSTEMS:  A complete 10 point review of system is done which is negative except mentioned above/history of present illness.   PAST MEDICAL HISTORY :  Past Medical History:  Diagnosis Date  . Breast cancer ([place])   . Breast cancer of upper-inner quadrant of right female breast ([place]) 09/21/2015   T1bN0 " 57m; ER100%, PR 90%, HER-2/neu not overexpressed.  . Cystitis   . Diabetes mellitus without complication (HCC)    NO MEDS-DIET CONTROLLED  . GERD (gastroesophageal reflux disease)   . Hernia 2011  . Hypertension   . [place]ignant [place] of  [place]   Left, T2 (2.3 cm) N0, triple negative. Chemotherapy/post wide excision whole breast radiation.  . 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 Personal history of chemotherapy 2013  . Personal history of malignant neoplasm of breast   . Personal history of radiation therapy 2017    Rt  . Vertigo     PAST SURGICAL HISTORY :   Past Surgical History:  Procedure Laterality Date  . BREAST BIOPSY Left 2013   +  . BREAST EXCISIONAL BIOPSY Right 2017   +  . BREAST LUMPECTOMY WITH SENTINEL LYMPH NODE BIOPSY Right 09/30/2015   Procedure: BREAST LUMPECTOMY WITH SENTINEL LYMPH NODE BX;  Surgeon: JRobert Bellow MD;  Location: [facility] ORS;  Service: General;  Laterality: Right;  . BREAST SURGERY Left 2012   wide excision  . BREAST SURGERY Left November 2013   Core biopsy of the upper-outer quadrant showed fat necrosis.  . CHOLECYSTECTOMY  2007  . COLONOSCOPY  2011   Dr OCandace Cruise . COLONOSCOPY WITH PROPOFOL N/A 02/25/2015   Procedure: COLONOSCOPY WITH PROPOFOL;  Surgeon: PHulen Luster MD;  [facility];  Service: Gastroenterology;  Laterality: N/A;  . DILATION AND CURETTAGE OF UTERUS  2004  . HERNIA REPAIR Right 2009   inguinal, and umbilical  . PORT-A-CATH REMOVAL    . PORTACATH PLACEMENT  2013  . UPPER GI ENDOSCOPY  2011    FAMILY HISTORY :   Family History  Problem Relation Age of Onset  .  Lymphoma Father   . Ovarian cancer Sister   . Colon cancer Brother   . Lymphoma Brother   . Cancer Other        stomach  . Cancer Other        stomach  . Breast cancer Neg Hx     SOCIAL HISTORY:   Social History  Substance Use Topics  . Smoking status: Never Smoker  . Smokeless tobacco: Never Used  . Alcohol use No    ALLERGIES:  has No Known Allergies.  MEDICATIONS:  Current Outpatient Prescriptions  Medication Sig Dispense Refill  . ACCU-CHEK FASTCLIX LANCETS MISC CHECK BLOOD SUGAR ONE TIME  DAILY 102 each 3  . ACCU-CHEK SMARTVIEW test strip CHECK BLOOD SUGAR ONCE A DAY. 100 each 3  . acetaminophen (TYLENOL) 500 MG tablet Take 500 mg by mouth every 6 (six) hours as needed for mild pain or headache.     Marland Kitchen acyclovir (ZOVIRAX) 200 MG capsule TAKE 1 CAPSULE EVERY DAY 90 capsule 3  . Alcohol Swabs (B-D SINGLE USE SWABS REGULAR) PADS CHECK BLOOD SUGAR ONE TIME DAILY 100 each 3  . aspirin EC 81 MG tablet Take 81 mg by mouth daily.    Marland Kitchen atorvastatin (LIPITOR) 10 MG tablet TAKE 1 TABLET EVERY EVENING 90 tablet 3  . bimatoprost (LUMIGAN) 0.01 % SOLN Place 1 drop into both eyes at bedtime.     . Blood Glucose Calibration (ACCU-CHEK SMARTVIEW CONTROL) LIQD     . Calcium Carb-Cholecalciferol (CALCIUM 600+D3) 600-800 MG-UNIT TABS Take 1 tablet by mouth 2 (two) times daily.    . cholecalciferol (VITAMIN D) 1000 units tablet Take 1,000 Units by mouth daily.    . cyanocobalamin 500 MCG tablet Take 500 mcg by mouth daily.    Marland Kitchen escitalopram (LEXAPRO) 10 MG tablet Take 1 tablet (10 mg total) by mouth daily. 30 tablet 12  . gabapentin (NEURONTIN) 300 MG capsule Take 2 capsules (600 mg total) by mouth at bedtime. 180 capsule 3  . latanoprost (XALATAN) 0.005 % ophthalmic solution Place 1 drop into both eyes at bedtime.     Marland Kitchen letrozole (FEMARA) 2.5 MG tablet TAKE ONE TABLET BY MOUTH ONCE DAILY 30 tablet 11  . meclizine (ANTIVERT) 25 MG tablet Take 0.5 tablets (12.5 mg total) by mouth 3 (three) times daily as needed for dizziness. 15 tablet 0  . meloxicam (MOBIC) 15 MG tablet TAKE 1 TABLET EVERY DAY 90 tablet 3  . metFORMIN (GLUCOPHAGE) 500 MG tablet Take 1 tablet (500 mg total) by mouth 2 (two) times daily with a meal. 60 tablet 0  . metoprolol tartrate (LOPRESSOR) 25 MG tablet TAKE 1/2 TABLET TWICE DAILY 90 tablet 3  . Multiple Vitamin (MULTIVITAMIN WITH MINERALS) TABS tablet Take 1 tablet by mouth daily.    Marland Kitchen omega-3 acid ethyl esters (LOVAZA) 1 g capsule Take 3 g by mouth daily.     Marland Kitchen omeprazole  (PRILOSEC) 20 MG capsule TAKE 1 CAPSULE TWICE DAILY 180 capsule 1  . pyridoxine (B-6) 100 MG tablet Take 100 mg by mouth daily.    . vitamin C (ASCORBIC ACID) 500 MG tablet Take 1,000 mg by mouth daily.    . vitamin E 400 UNIT capsule Take 400 Units by mouth daily.     No current facility-administered medications for this visit.     PHYSICAL EXAMINATION: ECOG PERFORMANCE STATUS: 0 - Asymptomatic  BP (!) 140/91   Pulse 74   Temp 97.7 F (36.5 C) (Tympanic)   Resp 20  Ht _0  (1.753 m)   Wt 212 lb (96.2 kg)   BMI 31.31 kg/m   Filed Weights   04/13/17 1359  Weight: 212 lb (96.2 kg)    GENERAL: Well-nourished well-developed; Alert, no distress and comfortable.   Alone.  EYES: no pallor or icterus OROPHARYNX: no thrush or ulceration; good dentition  NECK: supple, no masses felt LYMPH:  no palpable lymphadenopathy in the cervical, axillary or inguinal regions LUNGS: clear to auscultation and  No wheeze or crackles HEART/CVS: regular rate & rhythm and no murmurs; No lower extremity edema ABDOMEN:abdomen soft, non-tender and normal bowel sounds Musculoskeletal:no cyanosis of digits and no clubbing  PSYCH: alert & oriented x 3 with fluent speech NEURO: no focal motor/sensory deficits SKIN:  no rashes or significant lesions     LABORATORY DATA:  I have reviewed the data as listed    Component Value Date/Time   NA 136 04/13/2017 1336   NA 143 01/24/2017 0916   NA 140 12/23/2012 0949   K 3.9 04/13/2017 1336   K 4.7 12/23/2012 0949   CL 101 04/13/2017 1336   CL 101 12/23/2012 0949   CO2 26 04/13/2017 1336   CO2 30 12/23/2012 0949   GLUCOSE 155 (H) 04/13/2017 1336   GLUCOSE 168 (H) 12/23/2012 0949   BUN 12 04/13/2017 1336   BUN 11 01/24/2017 0916   BUN 9 12/23/2012 0949   CREATININE 0.87 04/13/2017 1336   CREATININE 0.92 09/04/2014 0916   CALCIUM 9.5 04/13/2017 1336   CALCIUM 9.8 12/23/2012 0949   PROT 7.6 04/13/2017 1336   PROT 7.4 01/24/2017 0916   PROT 7.4  09/04/2014 0916   ALBUMIN 4.1 04/13/2017 1336   ALBUMIN 4.5 01/24/2017 0916   ALBUMIN 3.7 09/04/2014 0916   AST 25 04/13/2017 1336   AST 21 09/04/2014 0916   ALT 22 04/13/2017 1336   ALT 34 09/04/2014 0916   ALKPHOS 66 04/13/2017 1336   ALKPHOS 85 09/04/2014 0916   BILITOT 0.4 04/13/2017 1336   BILITOT 0.3 01/24/2017 0916   BILITOT 0.4 09/04/2014 0916   GFRNONAA >60 04/13/2017 1336   GFRNONAA >60 09/04/2014 0916   GFRNONAA >60 02/18/2014 1048   GFRAA >60 04/13/2017 1336   GFRAA >60 09/04/2014 0916   GFRAA >60 02/18/2014 1048    No results found for: SPEP, UPEP  Lab Results  Component Value Date   WBC 5.8 04/13/2017   NEUTROABS 3.0 04/13/2017   HGB 15.0 04/13/2017   HCT 42.9 04/13/2017   MCV 88.8 04/13/2017   PLT 241 04/13/2017      Chemistry      Component Value Date/Time   NA 136 04/13/2017 1336   NA 143 01/24/2017 0916   NA 140 12/23/2012 0949   K 3.9 04/13/2017 1336   K 4.7 12/23/2012 0949   CL 101 04/13/2017 1336   CL 101 12/23/2012 0949   CO2 26 04/13/2017 1336   CO2 30 12/23/2012 0949   BUN 12 04/13/2017 1336   BUN 11 01/24/2017 0916   BUN 9 12/23/2012 0949   CREATININE 0.87 04/13/2017 1336   CREATININE 0.92 09/04/2014 0916      Component Value Date/Time   CALCIUM 9.5 04/13/2017 1336   CALCIUM 9.8 12/23/2012 0949   ALKPHOS 66 04/13/2017 1336   ALKPHOS 85 09/04/2014 0916   AST 25 04/13/2017 1336   AST 21 09/04/2014 0916   ALT 22 04/13/2017 1336   ALT 34 09/04/2014 0916   BILITOT 0.4 04/13/2017 1336   BILITOT  0.3 01/24/2017 0916   BILITOT 0.4 09/04/2014 0916        ASSESSMENT & PLAN:   Carcinoma of upper-outer quadrant of right breast in female, estrogen receptor positive ([place]) # 2017 right breast cancer stage I ER/PR positive HER-2/neu negative status post lumpectomy. No chemotherapy. Status post adjuvant radiation.   # On Femara. Tolerating well without any major side effects except for arthtis.  # hand joint stiffness- on tylenol prn;  recommend addition of chondriotin/glucoasmine  # Genetic testing discussed- with family -declines.   # Bone density test April 2017 within normal limits. Continue calcium and vitamin D.  # follow up in 6 months/ labs.       Cammie Sickle, MD 04/16/2017 1:53 PM

## 2017-04-13 NOTE — Assessment & Plan Note (Signed)
#  2017 right breast cancer stage I ER/PR positive HER-2/neu negative status post lumpectomy. No chemotherapy. Status post adjuvant radiation.   # On Femara. Tolerating well without any major side effects except for arthtis.  # hand joint stiffness- on tylenol prn; recommend addition of chondriotin/glucoasmine  # Genetic testing discussed- with family -declines.   # Bone density test April 2017 within normal limits. Continue calcium and vitamin D.  # follow up in 6 months/ labs.

## 2017-04-19 ENCOUNTER — Ambulatory Visit (INDEPENDENT_AMBULATORY_CARE_PROVIDER_SITE_OTHER): Payer: Medicare HMO

## 2017-04-19 DIAGNOSIS — Z23 Encounter for immunization: Secondary | ICD-10-CM | POA: Diagnosis not present

## 2017-04-23 ENCOUNTER — Other Ambulatory Visit (INDEPENDENT_AMBULATORY_CARE_PROVIDER_SITE_OTHER): Payer: Medicare HMO | Admitting: Emergency Medicine

## 2017-04-23 DIAGNOSIS — E118 Type 2 diabetes mellitus with unspecified complications: Secondary | ICD-10-CM

## 2017-04-23 LAB — POCT UA - MICROALBUMIN: MICROALBUMIN (UR) POC: 100 mg/L

## 2017-05-01 ENCOUNTER — Other Ambulatory Visit: Payer: Self-pay | Admitting: Family Medicine

## 2017-05-01 DIAGNOSIS — K219 Gastro-esophageal reflux disease without esophagitis: Secondary | ICD-10-CM

## 2017-06-08 ENCOUNTER — Ambulatory Visit
Admission: RE | Admit: 2017-06-08 | Discharge: 2017-06-08 | Disposition: A | Discharge status: Home / Self Care | Payer: Medicare HMO | Source: Ambulatory Visit | Attending: Radiation Oncology | Admitting: Radiation Oncology

## 2017-06-08 ENCOUNTER — Encounter: Payer: Self-pay | Admitting: Radiation Oncology

## 2017-06-08 VITALS — BP 149/91 | HR 92 | Temp 96.5°F | Resp 18 | Wt 201.5 lb

## 2017-06-08 DIAGNOSIS — C50911 Malignant neoplasm of unspecified site of right female breast: Secondary | ICD-10-CM | POA: Insufficient documentation

## 2017-06-08 DIAGNOSIS — Z170 Estrogen receptor positive status [ER+]: Secondary | ICD-10-CM | POA: Diagnosis not present

## 2017-06-08 DIAGNOSIS — C50211 Malignant neoplasm of upper-inner quadrant of right female breast: Secondary | ICD-10-CM

## 2017-06-08 DIAGNOSIS — Z79811 Long term (current) use of aromatase inhibitors: Secondary | ICD-10-CM | POA: Diagnosis not present

## 2017-06-08 DIAGNOSIS — Z923 Personal history of irradiation: Secondary | ICD-10-CM | POA: Insufficient documentation

## 2017-06-08 NOTE — Progress Notes (Signed)
Radiation Oncology Follow up Note  Name: Leah Schaefer   Date:   06/08/2017 MRN:  [id number] DOB: 03-14-1941    This 76 y.o. female presents to the clinic today for 1-1/2 year follow-up status post accelerated partial breast irradiation to her right breast for stage I invasive mammary carcinoma.  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 She is seen today in routine follow-up and is doing well. She specifically denies breast tenderness cough or bone pain.. She had mammograms back in May 2018 which were BI-RADS 2 benign which I have reviewed. She is currently on Femara tolerate that well without side effect.  COMPLICATIONS OF TREATMENT: none  FOLLOW UP COMPLIANCE: keeps appointments   PHYSICAL EXAM:  BP (!) 149/91   Pulse 92   Temp (!) 96.5 F (35.8 C)   Resp 18   Wt 201 lb 8 oz (91.4 kg)   BMI 29.76 kg/m  Lungs are clear to A&P cardiac examination essentially unremarkable with regular rate and rhythm. No dominant mass or nodularity is noted in either breast in 2 positions examined. Incision is well-healed. No axillary or supraclavicular adenopathy is appreciated. Cosmetic result is excellent. Well-developed well-nourished patient in NAD. HEENT reveals PERLA, EOMI, discs not visualized.  Oral cavity is clear. No oral mucosal lesions are identified. Neck is clear without evidence of cervical or supraclavicular adenopathy. Lungs are clear to A&P. Cardiac examination is essentially unremarkable with regular rate and rhythm without murmur rub or thrill. Abdomen is benign with no organomegaly or masses noted. Motor sensory and DTR levels are equal and symmetric in the upper and lower extremities. Cranial nerves II through XII are grossly intact. Proprioception is intact. No peripheral  adenopathy or edema is identified. No motor or sensory levels are noted. Crude visual fields are within normal range.  RADIOLOGY RESULTS: Mammograms are reviewed and compatible with the above-stated findings  PLAN: At the present time she continues to do well with no evidence of disease. I'm please were overall progress. I've asked to see her back in 1 year for follow-up. She continues on Femara without side effect. She or he has follow-up mammograms ordered. Patient knows to call with any concerns.  I would like to take this opportunity to thank you for allowing me to participate in the care of your patient.Armstead Peaks., MD

## 2017-06-14 ENCOUNTER — Encounter: Payer: Self-pay | Admitting: Family Medicine

## 2017-06-14 ENCOUNTER — Ambulatory Visit: Payer: Medicare HMO | Admitting: Family Medicine

## 2017-06-14 VITALS — BP 110/62 | HR 98 | Temp 98.1°F | Resp 14 | Wt 213.0 lb

## 2017-06-14 DIAGNOSIS — N390 Urinary tract infection, site not specified: Secondary | ICD-10-CM | POA: Diagnosis not present

## 2017-06-14 DIAGNOSIS — R350 Frequency of micturition: Secondary | ICD-10-CM

## 2017-06-14 DIAGNOSIS — R102 Pelvic and perineal pain: Secondary | ICD-10-CM

## 2017-06-14 LAB — POCT URINALYSIS DIPSTICK
Bilirubin, UA: NEGATIVE
Blood, UA: NEGATIVE
GLUCOSE UA: NEGATIVE
KETONES UA: NEGATIVE
Nitrite, UA: POSITIVE
PROTEIN UA: NEGATIVE
SPEC GRAV UA: 1.02 (ref 1.010–1.025)
Urobilinogen, UA: 0.2 E.U./dL
pH, UA: 6 (ref 5.0–8.0)

## 2017-06-14 MED ORDER — NITROFURANTOIN MONOHYD MACRO 100 MG PO CAPS: 100.0000 mg | ORAL_CAPSULE | Freq: Two times a day (BID) | ORAL | 0 refills | Status: DC

## 2017-06-14 NOTE — Progress Notes (Signed)
Patient: Leah Schaefer Female    DOB: 20-Jun-1941   76 y.o.   MRN: [id number] Visit Date: 06/14/2017  Today's Provider: [name], MD   Chief Complaint  Patient presents with  . Pelvic Pain   Subjective:    HPI Pt is here today for right sided pelvic pain. She reports that it started about 3 weeks ago. She has dysuria, frequency, urgency and low back pain. Pt reports that she had this before and Dr. Venia Minks would give her an antibiotic. She reports that she has not felt well the last 3 weeks.        No Known Allergies   Current Outpatient Medications:  .  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  acetaminophen (TYLENOL) 500 MG tablet, Take 500 mg by mouth every 6 (six) hours as needed for mild pain or headache. , Disp: , Rfl:  .  acyclovir (ZOVIRAX) 200 MG capsule, TAKE 1 CAPSULE EVERY DAY, Disp: 90 capsule, Rfl: 3 .  Alcohol Swabs (B-D SINGLE USE SWABS REGULAR) PADS, Check sugar once daily DX E11.9, Disp: 100 each, Rfl: 3 .  aspirin EC 81 MG tablet, Take 81 mg by mouth daily., Disp: , Rfl:  .  atorvastatin (LIPITOR) 10 MG tablet, TAKE 1 TABLET EVERY EVENING, Disp: 90 tablet, Rfl: 3 .  bimatoprost (LUMIGAN) 0.01 % SOLN, Place 1 drop into both eyes at bedtime. , Disp: , Rfl:  .  Blood Glucose Calibration (ACCU-CHEK SMARTVIEW CONTROL) LIQD, , Disp: , Rfl:  .  Calcium Carb-Cholecalciferol (CALCIUM 600+D3) 600-800 MG-UNIT TABS, Take 1 tablet by mouth 2 (two) times daily., Disp: , Rfl:  .  cholecalciferol (VITAMIN D) 1000 units tablet, Take 1,000 Units by mouth daily., Disp: , Rfl:  .  cyanocobalamin 500 MCG tablet, Take 500 mcg by mouth daily., Disp: , Rfl:  .  escitalopram (LEXAPRO) 10 MG tablet, Take 1 tablet (10 mg total) by mouth daily., Disp: 30 tablet, Rfl: 12 .  gabapentin (NEURONTIN) 300 MG capsule, Take 2 capsules (600 mg total) by mouth at bedtime., Disp: 180 capsule, Rfl: 3 .  glucose blood (ACCU-CHEK  SMARTVIEW) test strip, Check sugar once daily DX E11.9, Disp: 100 each, Rfl: 3 .  latanoprost (XALATAN) 0.005 % ophthalmic solution, Place 1 drop into both eyes at bedtime. , Disp: , Rfl:  .  letrozole (FEMARA) 2.5 MG tablet, TAKE ONE TABLET BY MOUTH ONCE DAILY, Disp: 30 tablet, Rfl: 11 .  metFORMIN (GLUCOPHAGE) 500 MG tablet, Take 1 tablet (500 mg total) by mouth 2 (two) times daily with a meal., Disp: 60 tablet, Rfl: 0 .  metoprolol tartrate (LOPRESSOR) 25 MG tablet, TAKE 1/2 TABLET TWICE DAILY, Disp: 90 tablet, Rfl: 3 .  Multiple Vitamin (MULTIVITAMIN WITH MINERALS) TABS tablet, Take 1 tablet by mouth daily., Disp: , Rfl:  .  omega-3 acid ethyl esters (LOVAZA) 1 g capsule, Take 3 g by mouth daily. , Disp: , Rfl:  .  omeprazole (PRILOSEC) 20 MG capsule, TAKE 1 CAPSULE TWICE DAILY, Disp: 180 capsule, Rfl: 3 .  pyridoxine (B-6) 100 MG tablet, Take 100 mg by mouth daily., Disp: , Rfl:  .  vitamin C (ASCORBIC ACID) 500 MG tablet, Take 1,000 mg by mouth daily., Disp: , Rfl:  .  vitamin E 400 UNIT capsule, Take 400 Units by mouth daily., Disp: , Rfl:  .  meclizine (ANTIVERT) 25 MG tablet, Take 0.5 tablets (12.5 mg total)  by mouth 3 (three) times daily as needed for dizziness. (Patient not taking: Reported on 06/14/2017), Disp: 15 tablet, Rfl: 0 .  meloxicam (MOBIC) 15 MG tablet, TAKE 1 TABLET EVERY DAY (Patient not taking: Reported on 06/14/2017), Disp: 90 tablet, Rfl: 3  Review of Systems  Constitutional: Positive for fatigue.  HENT: Negative.   Eyes: Negative.   Respiratory: Negative.   Cardiovascular: Negative.   Gastrointestinal: Negative.   Endocrine: Negative.   Genitourinary: Positive for frequency, pelvic pain and urgency.  Musculoskeletal: Positive for back pain.  Skin: Negative.   Allergic/Immunologic: Negative.   Neurological: Negative.   Hematological: Negative.   Psychiatric/Behavioral: Negative.     Social History   Tobacco Use  . Smoking status: Never Smoker  . Smokeless  tobacco: Never Used  Substance Use Topics  . Alcohol use: No   Objective:   BP 110/62 (BP Location: Left Arm, Patient Position: Sitting, Cuff Size: Large)   Pulse 98   Temp 98.1 F (36.7 C) (Oral)   Resp 14   Wt 213 lb (96.6 kg)   BMI 31.45 kg/m  Vitals:   06/14/17 1116  BP: 110/62  Pulse: 98  Resp: 14  Temp: 98.1 F (36.7 C)  TempSrc: Oral  Weight: 213 lb (96.6 kg)     Physical Exam  Constitutional: She is oriented to person, place, and time. She appears well-developed and well-nourished.  Eyes: Conjunctivae and EOM are normal. Pupils are equal, round, and reactive to light.  Neck: Normal range of motion. Neck supple.  Cardiovascular: Normal rate, regular rhythm, normal heart sounds and intact distal pulses.  Pulmonary/Chest: Effort normal and breath sounds normal.  Abdominal: Soft. Bowel sounds are normal.  Musculoskeletal: Normal range of motion.  Neurological: She is alert and oriented to person, place, and time. She has normal reflexes.  Skin: Skin is warm and dry.  Psychiatric: She has a normal mood and affect. Her behavior is normal. Judgment and thought content normal.        Assessment & Plan:     1. Urinary tract infection without hematuria, site unspecified  - nitrofurantoin, macrocrystal-monohydrate, (MACROBID) 100 MG capsule; Take 1 capsule (100 mg total) 2 (two) times daily by mouth.  Dispense: 10 capsule; Refill: 0 - POCT urinalysis dipstick - Urine Culture  2. Pelvic pain Mild--normal exam.  3. Urinary frequency 4.TIIDM  I have done the exam and reviewed the chart and it is accurate to the best of my knowledge. Development worker, community has been used and  any errors in dictation or transcription are unintentional. [name] M.D. [place], MD  [facility]

## 2017-06-16 LAB — URINE CULTURE
MICRO NUMBER:: 81258434
SPECIMEN QUALITY: ADEQUATE

## 2017-07-12 ENCOUNTER — Telehealth: Payer: Self-pay | Admitting: *Deleted

## 2017-07-12 ENCOUNTER — Encounter: Payer: Self-pay | Admitting: General Surgery

## 2017-07-12 ENCOUNTER — Ambulatory Visit (INDEPENDENT_AMBULATORY_CARE_PROVIDER_SITE_OTHER): Payer: Medicare HMO | Admitting: General Surgery

## 2017-07-12 VITALS — BP 124/68 | HR 66 | Ht 68.0 in | Wt 210.0 lb

## 2017-07-12 DIAGNOSIS — R1011 Right upper quadrant pain: Secondary | ICD-10-CM

## 2017-07-12 DIAGNOSIS — R1033 Periumbilical pain: Secondary | ICD-10-CM | POA: Insufficient documentation

## 2017-07-12 NOTE — Patient Instructions (Signed)
  Patient to have a CT with IV and contacts. The patient is aware to call back for any questions or concerns.

## 2017-07-12 NOTE — Progress Notes (Signed)
Patient ID: Leah Schaefer, female   DOB: [birth year], 76 y.o.   MRN: [id number]  Chief Complaint  Patient presents with  . Other    HPI Leah Schaefer is a 76 y.o. femalehere today for a evaluation of pain at her umbilical area.  The pain started about a month ago and feels like something is sticking her. Sometimes the pain radiates to her right side and back.  Moves her bowels daily. Activity will make her umbilical area more tender.  She has use acetaminophen two times daily and makes it the pain better. In the last two days the area has need worse.  While standing the tenderness is right side of the umbilical area.                                          Marland KitchenHPI  Past Medical History:  Diagnosis Date  . Breast cancer ([place])   . Breast cancer of upper-inner quadrant of right female breast ([place]) 09/21/2015   T1bN0 " 37m; ER100%, PR 90%, HER-2/neu not overexpressed.  . Cystitis   . Diabetes mellitus without complication ([facility])    NO MEDS-DIET CONTROLLED  . GERD (gastroesophageal reflux disease)   . Hernia 2011  . Hypertension   . Malignant neoplasm of upper-inner quadrant of [place] 08[place]17/[place]   Left, T2 (2.3 cm) N0, triple negative. Chemotherapy/post wide excision whole breast radiation.  . 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 Personal history of chemotherapy 2013  . Personal history of malignant neoplasm of breast   . Personal history of radiation therapy 2017    Rt  . Vertigo     Past Surgical History:  Procedure Laterality Date  . BREAST BIOPSY Left 2013   +  . BREAST EXCISIONAL BIOPSY Right 2017   +  . BREAST LUMPECTOMY WITH SENTINEL LYMPH NODE BIOPSY Right 09/30/2015   Procedure: BREAST LUMPECTOMY WITH SENTINEL LYMPH NODE BX;  Surgeon: JRobert Bellow MD;  Location: [facility] ORS;  Service: General;  Laterality: Right;  . BREAST SURGERY Left 2012   wide excision  . BREAST SURGERY Left November 2013   Core biopsy of the  upper-outer quadrant showed fat necrosis.  . CHOLECYSTECTOMY  2007  . COLONOSCOPY  2011   Dr OCandace Cruise . COLONOSCOPY WITH PROPOFOL N/A 02/25/2015   Procedure: COLONOSCOPY WITH PROPOFOL;  Surgeon: PHulen Luster MD;  [facility];  Service: Gastroenterology;  Laterality: N/A;  . DILATION AND CURETTAGE OF UTERUS  2004  . HERNIA REPAIR Right 2009   inguinal, and umbilical  . PORT-A-CATH REMOVAL    . PORTACATH PLACEMENT  2013  . UPPER GI ENDOSCOPY  2011    Family History  Problem Relation Age of Onset  . Lymphoma Father   . Ovarian cancer Sister   . Colon cancer Brother   . Lymphoma Brother   . Cancer Other        stomach  . Cancer Other        stomach  . Breast cancer Neg Hx     Social History Social History   Tobacco Use  . Smoking status: Never Smoker  . Smokeless tobacco: Never Used  Substance Use Topics  . Alcohol use: No  . Drug use: No    No Known Allergies  Current Outpatient Medications  Medication Sig Dispense Refill  .  ACCU-CHEK FASTCLIX LANCETS MISC Check sugar once daily DX E11.9 102 each 3  . acetaminophen (TYLENOL) 500 MG tablet Take 500 mg by mouth every 6 (six) hours as needed for mild pain or headache.     Marland Kitchen acyclovir (ZOVIRAX) 200 MG capsule TAKE 1 CAPSULE EVERY DAY 90 capsule 3  . Alcohol Swabs (B-D SINGLE USE SWABS REGULAR) PADS Check sugar once daily DX E11.9 100 each 3  . aspirin EC 81 MG tablet Take 81 mg by mouth daily.    Marland Kitchen atorvastatin (LIPITOR) 10 MG tablet TAKE 1 TABLET EVERY EVENING 90 tablet 3  . bimatoprost (LUMIGAN) 0.01 % SOLN Place 1 drop into both eyes at bedtime.     . Blood Glucose Calibration (ACCU-CHEK SMARTVIEW CONTROL) LIQD     . Calcium Carb-Cholecalciferol (CALCIUM 600+D3) 600-800 MG-UNIT TABS Take 1 tablet by mouth 2 (two) times daily.    . cholecalciferol (VITAMIN D) 1000 units tablet Take 1,000 Units by mouth daily.    . cyanocobalamin 500 MCG tablet Take 500 mcg by mouth daily.    Marland Kitchen escitalopram (LEXAPRO) 10 MG tablet  Take 1 tablet (10 mg total) by mouth daily. 30 tablet 12  . gabapentin (NEURONTIN) 300 MG capsule Take 2 capsules (600 mg total) by mouth at bedtime. 180 capsule 3  . glucose blood (ACCU-CHEK SMARTVIEW) test strip Check sugar once daily DX E11.9 100 each 3  . latanoprost (XALATAN) 0.005 % ophthalmic solution Place 1 drop into both eyes at bedtime.     Marland Kitchen letrozole (FEMARA) 2.5 MG tablet TAKE ONE TABLET BY MOUTH ONCE DAILY 30 tablet 11  . meclizine (ANTIVERT) 25 MG tablet Take 0.5 tablets (12.5 mg total) by mouth 3 (three) times daily as needed for dizziness. 15 tablet 0  . meloxicam (MOBIC) 15 MG tablet TAKE 1 TABLET EVERY DAY 90 tablet 3  . metFORMIN (GLUCOPHAGE) 500 MG tablet Take 1 tablet (500 mg total) by mouth 2 (two) times daily with a meal. 60 tablet 0  . metoprolol tartrate (LOPRESSOR) 25 MG tablet TAKE 1/2 TABLET TWICE DAILY 90 tablet 3  . Multiple Vitamin (MULTIVITAMIN WITH MINERALS) TABS tablet Take 1 tablet by mouth daily.    . nitrofurantoin, macrocrystal-monohydrate, (MACROBID) 100 MG capsule Take 1 capsule (100 mg total) 2 (two) times daily by mouth. 10 capsule 0  . omega-3 acid ethyl esters (LOVAZA) 1 g capsule Take 3 g by mouth daily.     Marland Kitchen omeprazole (PRILOSEC) 20 MG capsule TAKE 1 CAPSULE TWICE DAILY 180 capsule 3  . pyridoxine (B-6) 100 MG tablet Take 100 mg by mouth daily.    . vitamin C (ASCORBIC ACID) 500 MG tablet Take 1,000 mg by mouth daily.    . vitamin E 400 UNIT capsule Take 400 Units by mouth daily.     No current facility-administered medications for this visit.     Review of Systems Review of Systems  Constitutional: Negative.   Respiratory: Negative.   Cardiovascular: Negative.     Blood pressure 124/68, pulse 66, height '5\' 8"'  (1.727 m), weight 210 lb (95.3 kg).  Physical Exam Physical Exam  Constitutional: She is oriented to person, place, and time. She appears well-developed and well-nourished.  Cardiovascular: Normal rate, regular rhythm and normal  heart sounds.  Pulmonary/Chest: Effort normal and breath sounds normal.  Abdominal: Soft. Normal appearance and bowel sounds are normal. There is tenderness in the periumbilical area.    Neurological: She is alert and oriented to person, place, and time.  Skin:  Skin is warm and dry.    Data Reviewed No abdominal imaging studies reported since August 2013. 04/13/2017 comprehensive metabolic showed a creatinine of 0.87 with an estimated GFR greater than 60. Normal electrolytes.  Colonoscopy of 02/25/2015 was normal. Assessment    Atypical abdominal pain.    Plan    The patient reports pain relief with the use of a single Tylenol morning and evening. Her pain has been more significant the past week, although not associated with nausea or vomiting.  Based on her exam, this is likely a process in the abdominal wall, although hernias in either area where she is most tender (hen examined supine or standing) are exceptionally rare.  Options for management include observation versus CT scan. Patient desires a diagnostic study.     Patient to have a CT with IV and contacts. The patient is aware to call back for any questions or concerns.  HPI, Physical Exam, Assessment and Plan have been scribed under the direction and in the presence of Hervey Ard, MD.  Gaspar Cola, CMA  I have completed the exam and reviewed the above documentation for accuracy and completeness.  I agree with the above.  Haematologist has been used and any errors in dictation or transcription are unintentional.  Hervey Ard, M.D., F.A.C.S.  Robert Bellow 07/12/2017, 7:59 PM  Patient has been scheduled for a CT abdomen/pelvis with contrast at [place] for 07-20-17 at 8 am (arrive 7:45 am). Prep: NPO 4 hours prior  and pick up prep kit. Patient verbalizes understanding.  Dominga Ferry, CMA

## 2017-07-12 NOTE — Telephone Encounter (Signed)
Per Raquel Sarna, patient called the office wanting to cancel CT scan until she gets her insurance issue resolved.   JSHFWYO with the [facility] scheduling department, has been notified of cancellation.

## 2017-07-17 ENCOUNTER — Ambulatory Visit: Payer: Self-pay | Admitting: Family Medicine

## 2017-07-20 ENCOUNTER — Ambulatory Visit: Admission: RE | Admit: 2017-07-20 | Payer: Medicare HMO | Source: Ambulatory Visit

## 2017-07-23 ENCOUNTER — Encounter: Payer: Self-pay | Admitting: *Deleted

## 2017-07-23 NOTE — Progress Notes (Signed)
Patient has rescheduled her CT scan to 07-27-17 at 3:30 pm (arrive 3:15 pm-[facility]). Prep: NPO 4 hours prior and pick up prep kit. She verbalizes understanding.

## 2017-07-24 ENCOUNTER — Ambulatory Visit: Payer: Medicare HMO | Admitting: Family Medicine

## 2017-07-24 VITALS — BP 128/72 | HR 68 | Temp 98.4°F | Resp 16 | Wt 213.0 lb

## 2017-07-24 DIAGNOSIS — R1011 Right upper quadrant pain: Secondary | ICD-10-CM | POA: Diagnosis not present

## 2017-07-24 DIAGNOSIS — F419 Anxiety disorder, unspecified: Secondary | ICD-10-CM | POA: Diagnosis not present

## 2017-07-24 DIAGNOSIS — K219 Gastro-esophageal reflux disease without esophagitis: Secondary | ICD-10-CM | POA: Diagnosis not present

## 2017-07-24 DIAGNOSIS — Z170 Estrogen receptor positive status [ER+]: Secondary | ICD-10-CM

## 2017-07-24 DIAGNOSIS — C50411 Malignant neoplasm of upper-outer quadrant of right female breast: Secondary | ICD-10-CM

## 2017-07-24 DIAGNOSIS — I10 Essential (primary) hypertension: Secondary | ICD-10-CM

## 2017-07-24 DIAGNOSIS — J309 Allergic rhinitis, unspecified: Secondary | ICD-10-CM | POA: Diagnosis not present

## 2017-07-24 DIAGNOSIS — E118 Type 2 diabetes mellitus with unspecified complications: Secondary | ICD-10-CM | POA: Diagnosis not present

## 2017-07-24 LAB — POCT GLYCOSYLATED HEMOGLOBIN (HGB A1C): HEMOGLOBIN A1C: 7

## 2017-07-24 NOTE — Progress Notes (Signed)
Patient: Leah Schaefer Female    DOB: 11-20-1940   76 y.o.   MRN: [id number] Visit Date: 07/24/2017  Today's Provider: [name], MD   Chief Complaint  Patient presents with  . Diabetes   Subjective:    HPI  Diabetes Mellitus Type II, Follow-up:   Lab Results  Component Value Date   HGBA1C 7.6 03/22/2017   HGBA1C 7.8 12/06/2016   HGBA1C 7.7 08/08/2016    Last seen for diabetes 3 months ago.  Management since then includes none. She reports good compliance with treatment. She is not having side effects.  Home blood sugar records: 130-150's  Episodes of hypoglycemia?    Current Insulin Regimen: n/a Most Recent Eye Exam: 10/11/16 Current exercise: walking 1-2 miles a day.  Pertinent Labs:    Component Value Date/Time   CHOL 118 01/24/2017 0916   TRIG 96 01/24/2017 0916   HDL 37 (L) 01/24/2017 0916   LDLCALC 62 01/24/2017 0916   CREATININE 0.87 04/13/2017 1336   CREATININE 0.92 09/04/2014 0916    Wt Readings from Last 3 Encounters:  07/24/17 213 lb (96.6 kg)  07/12/17 210 lb (95.3 kg)  06/14/17 213 lb (96.6 kg)    ------------------------------------------------------------------------  Pt reports that she is still having the stomach pain that she was having at last OV. She is seeing Dr. Terri Piedra about it and she is having a CT next week.      No Known Allergies   Current Outpatient Medications:  .  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  acetaminophen (TYLENOL) 500 MG tablet, Take 500 mg by mouth every 6 (six) hours as needed for mild pain or headache. , Disp: , Rfl:  .  acyclovir (ZOVIRAX) 200 MG capsule, TAKE 1 CAPSULE EVERY DAY, Disp: 90 capsule, Rfl: 3 .  Alcohol Swabs (B-D SINGLE USE SWABS REGULAR) PADS, Check sugar once daily DX E11.9, Disp: 100 each, Rfl: 3 .  aspirin EC 81 MG tablet, Take 81 mg by mouth daily., Disp: , Rfl:  .  atorvastatin (LIPITOR) 10 MG tablet, TAKE 1 TABLET  EVERY EVENING, Disp: 90 tablet, Rfl: 3 .  bimatoprost (LUMIGAN) 0.01 % SOLN, Place 1 drop into both eyes at bedtime. , Disp: , Rfl:  .  Blood Glucose Calibration (ACCU-CHEK SMARTVIEW CONTROL) LIQD, , Disp: , Rfl:  .  Calcium Carb-Cholecalciferol (CALCIUM 600+D3) 600-800 MG-UNIT TABS, Take 1 tablet by mouth 2 (two) times daily., Disp: , Rfl:  .  cholecalciferol (VITAMIN D) 1000 units tablet, Take 1,000 Units by mouth daily., Disp: , Rfl:  .  cyanocobalamin 500 MCG tablet, Take 500 mcg by mouth daily., Disp: , Rfl:  .  escitalopram (LEXAPRO) 10 MG tablet, Take 1 tablet (10 mg total) by mouth daily., Disp: 30 tablet, Rfl: 12 .  gabapentin (NEURONTIN) 300 MG capsule, Take 2 capsules (600 mg total) by mouth at bedtime., Disp: 180 capsule, Rfl: 3 .  glucose blood (ACCU-CHEK SMARTVIEW) test strip, Check sugar once daily DX E11.9, Disp: 100 each, Rfl: 3 .  latanoprost (XALATAN) 0.005 % ophthalmic solution, Place 1 drop into both eyes at bedtime. , Disp: , Rfl:  .  letrozole (FEMARA) 2.5 MG tablet, TAKE ONE TABLET BY MOUTH ONCE DAILY, Disp: 30 tablet, Rfl: 11 .  meclizine (ANTIVERT) 25 MG tablet, Take 0.5 tablets (12.5 mg total) by mouth 3 (three) times daily as needed for dizziness., Disp: 15 tablet, Rfl: 0 .  meloxicam (MOBIC) 15 MG tablet, TAKE 1 TABLET EVERY DAY, Disp: 90 tablet, Rfl: 3 .  metFORMIN (GLUCOPHAGE) 500 MG tablet, Take 1 tablet (500 mg total) by mouth 2 (two) times daily with a meal., Disp: 60 tablet, Rfl: 0 .  metoprolol tartrate (LOPRESSOR) 25 MG tablet, TAKE 1/2 TABLET TWICE DAILY, Disp: 90 tablet, Rfl: 3 .  Multiple Vitamin (MULTIVITAMIN WITH MINERALS) TABS tablet, Take 1 tablet by mouth daily., Disp: , Rfl:  .  nitrofurantoin, macrocrystal-monohydrate, (MACROBID) 100 MG capsule, Take 1 capsule (100 mg total) 2 (two) times daily by mouth., Disp: 10 capsule, Rfl: 0 .  omega-3 acid ethyl esters (LOVAZA) 1 g capsule, Take 3 g by mouth daily. , Disp: , Rfl:  .  omeprazole (PRILOSEC) 20 MG  capsule, TAKE 1 CAPSULE TWICE DAILY, Disp: 180 capsule, Rfl: 3 .  pyridoxine (B-6) 100 MG tablet, Take 100 mg by mouth daily., Disp: , Rfl:  .  vitamin C (ASCORBIC ACID) 500 MG tablet, Take 1,000 mg by mouth daily., Disp: , Rfl:  .  vitamin E 400 UNIT capsule, Take 400 Units by mouth daily., Disp: , Rfl:   Review of Systems  Constitutional: Negative.   HENT: Negative.   Eyes: Negative.   Cardiovascular: Negative.   Gastrointestinal: Positive for abdominal pain.  Endocrine: Negative.   Genitourinary: Negative.   Musculoskeletal: Negative.   Skin: Negative.   Allergic/Immunologic: Negative.   Neurological: Negative.   Hematological: Negative.   Psychiatric/Behavioral: Negative.     Social History   Tobacco Use  . Smoking status: Never Smoker  . Smokeless tobacco: Never Used  Substance Use Topics  . Alcohol use: No   Objective:   BP 128/72 (BP Location: Left Arm, Patient Position: Sitting, Cuff Size: Large)   Pulse 68   Temp 98.4 F (36.9 C) (Oral)   Resp 16   Wt 213 lb (96.6 kg)   BMI 32.39 kg/m  Vitals:   07/24/17 1135  BP: 128/72  Pulse: 68  Resp: 16  Temp: 98.4 F (36.9 C)  TempSrc: Oral  Weight: 213 lb (96.6 kg)     Physical Exam  Constitutional: She is oriented to person, place, and time. She appears well-developed and well-nourished.  HENT:  Head: Normocephalic and atraumatic.  Eyes: Conjunctivae and EOM are normal. Pupils are equal, round, and reactive to light.  Neck: Normal range of motion. Neck supple.  Cardiovascular: Normal rate, regular rhythm, normal heart sounds and intact distal pulses.  Pulmonary/Chest: Effort normal and breath sounds normal.  Abdominal: Soft. Bowel sounds are normal. She exhibits no distension and no mass. There is tenderness (RUQ ). There is no rebound and no guarding.  Musculoskeletal: Normal range of motion.  Neurological: She is alert and oriented to person, place, and time. She has normal reflexes.  Skin: Skin is warm  and dry.  Psychiatric: She has a normal mood and affect. Her behavior is normal. Judgment and thought content normal.        Assessment & Plan:     1. Type 2 diabetes mellitus with complication, without long-term current use of insulin (HCC)  - POCT HgB A1C 7.0 today. Continue diet and exercise.   2. RUQ pain/Abd Pain See Dr B tomorrow. 3.HTN 4.h/o Breast Cancer      HPI, Exam, and A&P Transcribed under the direction and in the presence of Richard L. Cranford Mon, MD  Electronically Signed: Katina Dung, CMA I have done the exam and reviewed the chart and it is accurate  to the best of my knowledge. Development worker, community has been used and  any errors in dictation or transcription are unintentional. [name] M.D. [place], MD  [facility]

## 2017-07-27 ENCOUNTER — Ambulatory Visit
Admission: RE | Admit: 2017-07-27 | Discharge: 2017-07-27 | Disposition: A | Discharge status: Home / Self Care | Payer: Medicare HMO | Source: Ambulatory Visit | Attending: General Surgery | Admitting: General Surgery

## 2017-07-27 DIAGNOSIS — R1011 Right upper quadrant pain: Secondary | ICD-10-CM

## 2017-07-27 DIAGNOSIS — M47895 Other spondylosis, thoracolumbar region: Secondary | ICD-10-CM | POA: Insufficient documentation

## 2017-07-27 DIAGNOSIS — R1033 Periumbilical pain: Secondary | ICD-10-CM

## 2017-07-27 LAB — POCT I-STAT CREATININE: CREATININE: 0.9 mg/dL (ref 0.44–1.00)

## 2017-07-27 MED ORDER — IOPAMIDOL (ISOVUE-370) INJECTION 76%
100.0000 mL | Freq: Once | INTRAVENOUS | Status: AC | PRN
  Administered 2017-07-27: 100 mL via INTRAVENOUS

## 2017-07-31 ENCOUNTER — Encounter: Payer: Self-pay | Admitting: Family Medicine

## 2017-08-02 ENCOUNTER — Telehealth: Payer: Self-pay

## 2017-08-02 NOTE — Telephone Encounter (Signed)
Patient called asking if she might be able to have some pain medication sent into her pharmacy. She reports that she has been using Tylenol for the side pain but it seems to be worsening and would like something more effective to try. She did complete her CT abdomen Pelvis on 07/27/17.

## 2017-08-03 ENCOUNTER — Other Ambulatory Visit: Payer: Self-pay | Admitting: Family Medicine

## 2017-08-03 ENCOUNTER — Telehealth: Payer: Self-pay | Admitting: Family Medicine

## 2017-08-03 MED ORDER — TRAMADOL HCL 50 MG PO TABS: 50.0000 mg | ORAL_TABLET | Freq: Four times a day (QID) | ORAL | 0 refills | Status: DC | PRN

## 2017-08-03 NOTE — Telephone Encounter (Signed)
Contacted patient. Reports she has touched base with Dr. Alben Spittle office and RX submitted. She will be following up with him on Aug 08, 2017.

## 2017-08-03 NOTE — Telephone Encounter (Signed)
Dr. B called in tramadol for pt and she is going to follow up on January 3rd.

## 2017-08-03 NOTE — Telephone Encounter (Signed)
Patient states that she is in pain on right front side of her stomach.  She states that she had a CT scan and it didn't show anything.  She is wondering if you could give her something for pain.  She uses [place]

## 2017-08-08 DIAGNOSIS — H40153 Residual stage of open-angle glaucoma, bilateral: Secondary | ICD-10-CM | POA: Diagnosis not present

## 2017-08-09 ENCOUNTER — Ambulatory Visit: Payer: Medicare HMO | Admitting: Family Medicine

## 2017-08-09 VITALS — BP 138/70 | HR 72 | Temp 97.6°F | Resp 16 | Wt 213.0 lb

## 2017-08-09 DIAGNOSIS — R1011 Right upper quadrant pain: Secondary | ICD-10-CM

## 2017-08-09 DIAGNOSIS — Z170 Estrogen receptor positive status [ER+]: Secondary | ICD-10-CM

## 2017-08-09 DIAGNOSIS — E119 Type 2 diabetes mellitus without complications: Secondary | ICD-10-CM

## 2017-08-09 DIAGNOSIS — C50411 Malignant neoplasm of upper-outer quadrant of right female breast: Secondary | ICD-10-CM | POA: Diagnosis not present

## 2017-08-09 DIAGNOSIS — K2970 Gastritis, unspecified, without bleeding: Secondary | ICD-10-CM

## 2017-08-09 MED ORDER — METFORMIN HCL 500 MG PO TABS: 500.0000 mg | ORAL_TABLET | Freq: Two times a day (BID) | ORAL | 3 refills | Status: DC

## 2017-08-09 MED ORDER — PANTOPRAZOLE SODIUM 40 MG PO TBEC: 40.0000 mg | DELAYED_RELEASE_TABLET | Freq: Two times a day (BID) | ORAL | 5 refills | Status: DC

## 2017-08-09 NOTE — Progress Notes (Signed)
Patient: Leah Schaefer Female    DOB: 1941/04/29   76 y.o.   MRN: [id number] Visit Date: 08/09/2017  Today's Provider: [name], MD   Chief Complaint  Patient presents with  . Abdominal Pain    follow up   Subjective:    HPI Pt was seen on 07/24/17 for abdominal pain. She saw Dr. Terri Piedra and had a CT and it was normal. Pt is still having the pain and called back wanting something else for pain. Dr. B called her in Tramadol. Pt reports that the tramadol helped the pain. She is worried that her cancer is back, she just wants to find out what it causing her pain. She reports that if she tries to do anything around the house she has to sit down because the pain get worse. No nausea, vomiting , diarrhea    No Known Allergies   Current Outpatient Medications:  .  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  acetaminophen (TYLENOL) 500 MG tablet, Take 500 mg by mouth every 6 (six) hours as needed for mild pain or headache. , Disp: , Rfl:  .  acyclovir (ZOVIRAX) 200 MG capsule, TAKE 1 CAPSULE EVERY DAY, Disp: 90 capsule, Rfl: 3 .  Alcohol Swabs (B-D SINGLE USE SWABS REGULAR) PADS, Check sugar once daily DX E11.9, Disp: 100 each, Rfl: 3 .  aspirin EC 81 MG tablet, Take 81 mg by mouth daily., Disp: , Rfl:  .  atorvastatin (LIPITOR) 10 MG tablet, TAKE 1 TABLET EVERY EVENING, Disp: 90 tablet, Rfl: 3 .  Blood Glucose Calibration (ACCU-CHEK SMARTVIEW CONTROL) LIQD, , Disp: , Rfl:  .  Calcium Carb-Cholecalciferol (CALCIUM 600+D3) 600-800 MG-UNIT TABS, Take 1 tablet by mouth 2 (two) times daily., Disp: , Rfl:  .  cholecalciferol (VITAMIN D) 1000 units tablet, Take 1,000 Units by mouth daily., Disp: , Rfl:  .  cyanocobalamin 500 MCG tablet, Take 500 mcg by mouth daily., Disp: , Rfl:  .  escitalopram (LEXAPRO) 10 MG tablet, Take 1 tablet (10 mg total) by mouth daily., Disp: 30 tablet, Rfl: 12 .  gabapentin (NEURONTIN) 300 MG capsule,  Take 2 capsules (600 mg total) by mouth at bedtime., Disp: 180 capsule, Rfl: 3 .  glucose blood (ACCU-CHEK SMARTVIEW) test strip, Check sugar once daily DX E11.9, Disp: 100 each, Rfl: 3 .  latanoprost (XALATAN) 0.005 % ophthalmic solution, Place 1 drop into both eyes at bedtime. , Disp: , Rfl:  .  letrozole (FEMARA) 2.5 MG tablet, TAKE ONE TABLET BY MOUTH ONCE DAILY, Disp: 30 tablet, Rfl: 11 .  metFORMIN (GLUCOPHAGE) 500 MG tablet, Take 1 tablet (500 mg total) by mouth 2 (two) times daily with a meal., Disp: 60 tablet, Rfl: 0 .  metoprolol tartrate (LOPRESSOR) 25 MG tablet, TAKE 1/2 TABLET TWICE DAILY, Disp: 90 tablet, Rfl: 3 .  Multiple Vitamin (MULTIVITAMIN WITH MINERALS) TABS tablet, Take 1 tablet by mouth daily., Disp: , Rfl:  .  omeprazole (PRILOSEC) 20 MG capsule, TAKE 1 CAPSULE TWICE DAILY, Disp: 180 capsule, Rfl: 3 .  pyridoxine (B-6) 100 MG tablet, Take 100 mg by mouth daily., Disp: , Rfl:  .  traMADol (ULTRAM) 50 MG tablet, Take 1 tablet (50 mg total) by mouth every 6 (six) hours as needed., Disp: 20 tablet, Rfl: 0 .  vitamin C (ASCORBIC ACID) 500 MG tablet, Take 1,000 mg by mouth daily., Disp: , Rfl:  .  vitamin E  400 UNIT capsule, Take 400 Units by mouth daily., Disp: , Rfl:  .  bimatoprost (LUMIGAN) 0.01 % SOLN, Place 1 drop into both eyes at bedtime. , Disp: , Rfl:  .  meclizine (ANTIVERT) 25 MG tablet, Take 0.5 tablets (12.5 mg total) by mouth 3 (three) times daily as needed for dizziness. (Patient not taking: Reported on 08/09/2017), Disp: 15 tablet, Rfl: 0 .  meloxicam (MOBIC) 15 MG tablet, TAKE 1 TABLET EVERY DAY (Patient not taking: Reported on 08/09/2017), Disp: 90 tablet, Rfl: 3 .  nitrofurantoin, macrocrystal-monohydrate, (MACROBID) 100 MG capsule, Take 1 capsule (100 mg total) 2 (two) times daily by mouth. (Patient not taking: Reported on 08/09/2017), Disp: 10 capsule, Rfl: 0 .  omega-3 acid ethyl esters (LOVAZA) 1 g capsule, Take 3 g by mouth daily. , Disp: , Rfl:   Review of  Systems  Constitutional: Negative.   HENT: Negative.   Eyes: Negative.   Respiratory: Negative.   Cardiovascular: Negative.   Gastrointestinal: Positive for abdominal pain.  Endocrine: Negative.   Genitourinary: Negative.   Musculoskeletal: Negative.   Skin: Negative.   Allergic/Immunologic: Negative.   Neurological: Negative.   Psychiatric/Behavioral: Negative.     Social History   Tobacco Use  . Smoking status: Never Smoker  . Smokeless tobacco: Never Used  Substance Use Topics  . Alcohol use: No   Objective:   BP 138/70 (BP Location: Left Arm, Patient Position: Sitting, Cuff Size: Large)   Pulse 72   Temp 97.6 F (36.4 C) (Oral)   Resp 16   Wt 213 lb (96.6 kg)   BMI 32.39 kg/m  Vitals:   08/09/17 1335  BP: 138/70  Pulse: 72  Resp: 16  Temp: 97.6 F (36.4 C)  TempSrc: Oral  Weight: 213 lb (96.6 kg)     Physical Exam  Constitutional: She is oriented to person, place, and time. She appears well-developed and well-nourished.  HENT:  Head: Normocephalic and atraumatic.  Eyes: Conjunctivae are normal.  Neck: No thyromegaly present.  Cardiovascular: Normal rate and normal heart sounds.  Pulmonary/Chest: Effort normal.  Abdominal: Soft. Bowel sounds are normal. She exhibits no distension. There is tenderness. There is no rebound and no guarding.  Minimal RUQ tenderness.Negative murphys.  Neurological: She is alert and oriented to person, place, and time.  Skin: Skin is warm and dry.  Psychiatric: She has a normal mood and affect. Her behavior is normal. Judgment and thought content normal.        Assessment & Plan:     1. Gastritis, presence of bleeding unspecified, unspecified chronicity, unspecified gastritis type Stop Omeprazole and start pantoprazole. Follow up in 2 weeks.  - pantoprazole (PROTONIX) 40 MG tablet; Take 1 tablet (40 mg total) by mouth 2 (two) times daily.  Dispense: 60 tablet; Refill: 5 2.MSK Abdominal pain Try heating pad. 3.Possible  Thoracic Disc Consider Tspine MRI.       I have done the exam and reviewed the above chart and it is accurate to the best of my knowledge. Development worker, community has been used in this note in any air is in the dictation or transcription are unintentional. I have done the exam and reviewed the above chart and it is accurate to the best of my knowledge. Development worker, community has been used in this note in any air is in the dictation or transcription are unintentional.  [name], MD  [place]

## 2017-08-09 NOTE — Patient Instructions (Signed)
Stop Omeprazole. Start Pantoprazole, that was called in to the pharmacy for you. Someone will call you about getting back in with Oncology.

## 2017-08-14 ENCOUNTER — Telehealth: Payer: Self-pay | Admitting: *Deleted

## 2017-08-14 NOTE — Telephone Encounter (Signed)
Received message that authorization was needed for letrozole. After contacting insurance this is a Tier 2 covered med and does not need authorization, $8/month for pt, pharmacy notified, pt aware

## 2017-08-14 NOTE — Telephone Encounter (Signed)
After talking with Colletta Maryland and Juliann Pulse at [facility] they continue to tell me authorization is not needed, explained that it was on back order. I called the Pharmacy Help Desk and the pharmacy will need to call [phone]. Crystal ( pharmacist )at [facility] aware and will call at her earliest convenience. Leah Schaefer is aware.

## 2017-08-15 ENCOUNTER — Telehealth: Payer: Self-pay | Admitting: Family Medicine

## 2017-08-15 ENCOUNTER — Other Ambulatory Visit: Payer: Self-pay | Admitting: Family Medicine

## 2017-08-15 NOTE — Telephone Encounter (Signed)
Pt is requesting refill on gabapentin (NEURONTIN) 300 MG capsule.She would like this sent to [facility] mail order

## 2017-08-16 MED ORDER — GABAPENTIN 300 MG PO CAPS: 600.0000 mg | ORAL_CAPSULE | Freq: Every day | ORAL | 3 refills | Status: DC

## 2017-08-21 ENCOUNTER — Encounter: Payer: Self-pay | Admitting: General Surgery

## 2017-08-23 ENCOUNTER — Ambulatory Visit: Payer: Medicare HMO | Admitting: Family Medicine

## 2017-08-23 ENCOUNTER — Encounter: Payer: Self-pay | Admitting: Family Medicine

## 2017-08-23 VITALS — BP 124/68 | HR 76 | Temp 98.1°F | Resp 16 | Wt 211.0 lb

## 2017-08-23 DIAGNOSIS — M5414 Radiculopathy, thoracic region: Secondary | ICD-10-CM | POA: Diagnosis not present

## 2017-08-23 DIAGNOSIS — R1011 Right upper quadrant pain: Secondary | ICD-10-CM

## 2017-08-23 DIAGNOSIS — R5383 Other fatigue: Secondary | ICD-10-CM | POA: Diagnosis not present

## 2017-08-23 NOTE — Progress Notes (Signed)
Patient: Leah Schaefer Female    DOB: [birth year]   77 y.o.   MRN: [id number] Visit Date: 08/23/2017  Today's Provider: [name], MD   Chief Complaint  Patient presents with  . Follow-up   Subjective:    HPI Pt is here for a 2 week follow up of abdominal pain. Last OV she was changed from Omeprazole to Pantoprazole and to try heating pad. Thought this was a possible thoracic disk problem and to consider T-Spine Xray. She reports that she is still not feeling well. The pain in her abdomen is still there. She is taking the pantoprazole as was prescribed at last OV. She reports that she does not have any energy.       No Known Allergies   Current Outpatient Medications:  .  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  acetaminophen (TYLENOL) 500 MG tablet, Take 500 mg by mouth every 6 (six) hours as needed for mild pain or headache. , Disp: , Rfl:  .  acyclovir (ZOVIRAX) 200 MG capsule, TAKE 1 CAPSULE EVERY DAY, Disp: 90 capsule, Rfl: 3 .  Alcohol Swabs (B-D SINGLE USE SWABS REGULAR) PADS, Check sugar once daily DX E11.9, Disp: 100 each, Rfl: 3 .  aspirin EC 81 MG tablet, Take 81 mg by mouth daily., Disp: , Rfl:  .  atorvastatin (LIPITOR) 10 MG tablet, TAKE 1 TABLET EVERY EVENING, Disp: 90 tablet, Rfl: 3 .  bimatoprost (LUMIGAN) 0.01 % SOLN, Place 1 drop into both eyes at bedtime. , Disp: , Rfl:  .  Blood Glucose Calibration (ACCU-CHEK SMARTVIEW CONTROL) LIQD, , Disp: , Rfl:  .  Calcium Carb-Cholecalciferol (CALCIUM 600+D3) 600-800 MG-UNIT TABS, Take 1 tablet by mouth 2 (two) times daily., Disp: , Rfl:  .  cholecalciferol (VITAMIN D) 1000 units tablet, Take 1,000 Units by mouth daily., Disp: , Rfl:  .  cyanocobalamin 500 MCG tablet, Take 500 mcg by mouth daily., Disp: , Rfl:  .  escitalopram (LEXAPRO) 10 MG tablet, Take 1 tablet (10 mg total) by mouth daily., Disp: 30 tablet, Rfl: 12 .  gabapentin (NEURONTIN) 300 MG  capsule, Take 2 capsules (600 mg total) by mouth at bedtime., Disp: 180 capsule, Rfl: 3 .  glucose blood (ACCU-CHEK SMARTVIEW) test strip, Check sugar once daily DX E11.9, Disp: 100 each, Rfl: 3 .  latanoprost (XALATAN) 0.005 % ophthalmic solution, Place 1 drop into both eyes at bedtime. , Disp: , Rfl:  .  letrozole (FEMARA) 2.5 MG tablet, TAKE ONE TABLET BY MOUTH ONCE DAILY, Disp: 30 tablet, Rfl: 11 .  metFORMIN (GLUCOPHAGE) 500 MG tablet, Take 1 tablet (500 mg total) by mouth 2 (two) times daily with a meal., Disp: 180 tablet, Rfl: 3 .  metoprolol tartrate (LOPRESSOR) 25 MG tablet, TAKE 1/2 TABLET TWICE DAILY, Disp: 90 tablet, Rfl: 3 .  Multiple Vitamin (MULTIVITAMIN WITH MINERALS) TABS tablet, Take 1 tablet by mouth daily., Disp: , Rfl:  .  omega-3 acid ethyl esters (LOVAZA) 1 g capsule, Take 3 g by mouth daily. , Disp: , Rfl:  .  pantoprazole (PROTONIX) 40 MG tablet, Take 1 tablet (40 mg total) by mouth 2 (two) times daily., Disp: 60 tablet, Rfl: 5 .  pyridoxine (B-6) 100 MG tablet, Take 100 mg by mouth daily., Disp: , Rfl:  .  traMADol (ULTRAM) 50 MG tablet, Take 1 tablet (50 mg total) by mouth every 6 (six) hours as needed., Disp:  20 tablet, Rfl: 0 .  vitamin C (ASCORBIC ACID) 500 MG tablet, Take 1,000 mg by mouth daily., Disp: , Rfl:  .  vitamin E 400 UNIT capsule, Take 400 Units by mouth daily., Disp: , Rfl:  .  meclizine (ANTIVERT) 25 MG tablet, Take 0.5 tablets (12.5 mg total) by mouth 3 (three) times daily as needed for dizziness. (Patient not taking: Reported on 08/09/2017), Disp: 15 tablet, Rfl: 0 .  meloxicam (MOBIC) 15 MG tablet, TAKE 1 TABLET EVERY DAY (Patient not taking: Reported on 08/09/2017), Disp: 90 tablet, Rfl: 3 .  nitrofurantoin, macrocrystal-monohydrate, (MACROBID) 100 MG capsule, Take 1 capsule (100 mg total) 2 (two) times daily by mouth. (Patient not taking: Reported on 08/09/2017), Disp: 10 capsule, Rfl: 0  Review of Systems  Constitutional: Positive for fatigue.  HENT:  Negative.   Eyes: Negative.   Respiratory: Negative.   Cardiovascular: Negative.   Gastrointestinal: Positive for abdominal pain.  Endocrine: Negative.   Genitourinary: Negative.   Musculoskeletal: Negative.   Skin: Negative.   Allergic/Immunologic: Negative.   Neurological: Negative.   Hematological: Negative.   Psychiatric/Behavioral: Negative.     Social History   Tobacco Use  . Smoking status: Never Smoker  . Smokeless tobacco: Never Used  Substance Use Topics  . Alcohol use: No   Objective:   BP 124/68 (BP Location: Left Arm, Patient Position: Sitting, Cuff Size: Large)   Pulse 76   Temp 98.1 F (36.7 C) (Oral)   Resp 16   Wt 211 lb (95.7 kg)   BMI 32.08 kg/m  Vitals:   08/23/17 1057  BP: 124/68  Pulse: 76  Resp: 16  Temp: 98.1 F (36.7 C)  TempSrc: Oral  Weight: 211 lb (95.7 kg)     Physical Exam  Constitutional: She is oriented to person, place, and time. She appears well-developed and well-nourished.  HENT:  Head: Normocephalic and atraumatic.  Right Ear: External ear normal.  Left Ear: External ear normal.  Nose: Nose normal.  Eyes: Conjunctivae and EOM are normal. Pupils are equal, round, and reactive to light.  Neck: Normal range of motion. Neck supple.  Cardiovascular: Normal rate, regular rhythm, normal heart sounds and intact distal pulses.  Pulmonary/Chest: Effort normal and breath sounds normal.  Abdominal: Soft. Bowel sounds are normal. There is tenderness (RUQ).  Neurological: She is alert and oriented to person, place, and time. She has normal reflexes.  Skin: Skin is warm and dry.  Psychiatric: She has a normal mood and affect. Her behavior is normal. Judgment and thought content normal.        Assessment & Plan:     1. RUQ pain This still appears to be muscular pain. Normal CT .  Pain is intconsistent and sometimes nonexistant. If MRI normal will refer to GI. - CBC with Differential/Platelet - Comprehensive metabolic panel -  Lipase - Ambulatory referral to Gastroenterology  2. Other fatigue  - CBC with Differential/Platelet  3. Thoracic radiculopathy  - MR THORACIC SPINE W WO CONTRAST; Future 4.TIIDM 5.Breast cancer     HPI, Exam, and A&P Transcribed under the direction and in the presence of Richard L. Cranford Mon, MD  Electronically Signed: Katina Dung, CMA  I have done the exam and reviewed the above chart and it is accurate to the best of my knowledge. Development worker, community has been used in this note in any air is in the dictation or transcription are unintentional.  [name], MD  [facility]

## 2017-08-24 LAB — CBC WITH DIFFERENTIAL/PLATELET
BASOS: 1 %
Basophils Absolute: 0 10*3/uL (ref 0.0–0.2)
EOS (ABSOLUTE): 0.1 10*3/uL (ref 0.0–0.4)
Eos: 2 %
Hematocrit: 44.4 % (ref 34.0–46.6)
Hemoglobin: 15.2 g/dL (ref 11.1–15.9)
Immature Grans (Abs): 0 10*3/uL (ref 0.0–0.1)
Immature Granulocytes: 0 %
Lymphocytes Absolute: 2.2 10*3/uL (ref 0.7–3.1)
Lymphs: 35 %
MCH: 30.6 pg (ref 26.6–33.0)
MCHC: 34.2 g/dL (ref 31.5–35.7)
MCV: 90 fL (ref 79–97)
Monocytes Absolute: 0.4 10*3/uL (ref 0.1–0.9)
Monocytes: 7 %
NEUTROS ABS: 3.4 10*3/uL (ref 1.4–7.0)
Neutrophils: 55 %
PLATELETS: 264 10*3/uL (ref 150–379)
RBC: 4.96 x10E6/uL (ref 3.77–5.28)
RDW: 13.4 % (ref 12.3–15.4)
WBC: 6.2 10*3/uL (ref 3.4–10.8)

## 2017-08-24 LAB — COMPREHENSIVE METABOLIC PANEL
A/G RATIO: 1.6 (ref 1.2–2.2)
ALT: 15 IU/L (ref 0–32)
AST: 18 IU/L (ref 0–40)
Albumin: 4.5 g/dL (ref 3.5–4.8)
Alkaline Phosphatase: 79 IU/L (ref 39–117)
BUN/Creatinine Ratio: 11 — ABNORMAL LOW (ref 12–28)
BUN: 10 mg/dL (ref 8–27)
CHLORIDE: 100 mmol/L (ref 96–106)
CO2: 25 mmol/L (ref 20–29)
Calcium: 10.1 mg/dL (ref 8.7–10.3)
Creatinine, Ser: 0.88 mg/dL (ref 0.57–1.00)
GFR calc non Af Amer: 64 mL/min/{1.73_m2} (ref >59)
GFR, EST AFRICAN AMERICAN: 74 mL/min/{1.73_m2} (ref >59)
Globulin, Total: 2.9 g/dL (ref 1.5–4.5)
Glucose: 90 mg/dL (ref 65–99)
POTASSIUM: 4.8 mmol/L (ref 3.5–5.2)
Sodium: 142 mmol/L (ref 134–144)
Total Protein: 7.4 g/dL (ref 6.0–8.5)

## 2017-08-24 LAB — LIPASE: Lipase: 44 U/L (ref 14–85)

## 2017-08-24 NOTE — Progress Notes (Signed)
Advised  ED 

## 2017-08-27 ENCOUNTER — Telehealth: Payer: Self-pay | Admitting: Family Medicine

## 2017-08-27 DIAGNOSIS — M5414 Radiculopathy, thoracic region: Secondary | ICD-10-CM

## 2017-08-27 NOTE — Telephone Encounter (Signed)
Yes  thx

## 2017-08-27 NOTE — Telephone Encounter (Signed)
New order placed-Anastasiya V Hopkins, RMA

## 2017-08-27 NOTE — Telephone Encounter (Signed)
Dr Rosanna Randy are you looking for disc problem? Please advise if it is ok to switch MRI if appropriate-Teighan Aubert V Abhijot Straughter, RMA

## 2017-08-27 NOTE — Telephone Encounter (Signed)
Per MRI department MRI thoracic spine should be ordered without contrast  if looking for disc problem

## 2017-08-30 ENCOUNTER — Encounter: Payer: Self-pay | Admitting: General Surgery

## 2017-08-31 ENCOUNTER — Telehealth: Payer: Self-pay | Admitting: Family Medicine

## 2017-08-31 NOTE — Telephone Encounter (Signed)
Brook with [facility] stated they received a referral for pt to be seen for breast cancer. Brook stated that pt is already an established pt of their's and is scheduled for F/U in March. Dietrich Pates is asking if the referral was sent by mistake or if Dr. Rosanna Randy is recommending that pt be seen prior her scheduled F/U appt in March. CB# [phone] [facility]) Please advise. Thanks TNP

## 2017-09-03 ENCOUNTER — Telehealth: Payer: Self-pay | Admitting: Family Medicine

## 2017-09-03 ENCOUNTER — Other Ambulatory Visit: Payer: Self-pay | Admitting: Internal Medicine

## 2017-09-03 DIAGNOSIS — Z170 Estrogen receptor positive status [ER+]: Principal | ICD-10-CM

## 2017-09-03 DIAGNOSIS — C50411 Malignant neoplasm of upper-outer quadrant of right female breast: Secondary | ICD-10-CM

## 2017-09-03 MED ORDER — DIAZEPAM 5 MG PO TABS: ORAL_TABLET | ORAL | 0 refills | Status: DC

## 2017-09-03 NOTE — Telephone Encounter (Signed)
Please advise-Leah Schaefer V Leah Schaefer, RMA  

## 2017-09-03 NOTE — Telephone Encounter (Signed)
Please advise-Anastasiya V Hopkins, RMA  

## 2017-09-03 NOTE — Telephone Encounter (Signed)
Maybe earlier due to significant RUQ abd pain with normal eval;uation/workup to this point. Thoracic MRI pending.

## 2017-09-03 NOTE — Telephone Encounter (Signed)
Diazepam 5mg --1-2 prior to MRI--#2 pills--make sure she has driver.

## 2017-09-03 NOTE — Telephone Encounter (Signed)
Pt states she will need something to help her relax in order to to complete MRI.She would like this sent to [place]

## 2017-09-03 NOTE — Telephone Encounter (Signed)
Brook advised and they will work on getting patient in-Denetria Luevanos Estell Harpin, RMA

## 2017-09-03 NOTE — Telephone Encounter (Signed)
RX called in and pt advised as below-Anastasiya V Hopkins, RMA

## 2017-09-04 ENCOUNTER — Inpatient Hospital Stay: Payer: Medicare HMO

## 2017-09-04 ENCOUNTER — Other Ambulatory Visit: Payer: Self-pay

## 2017-09-04 ENCOUNTER — Encounter: Payer: Self-pay | Admitting: Internal Medicine

## 2017-09-04 ENCOUNTER — Inpatient Hospital Stay: Payer: Medicare HMO | Attending: Internal Medicine | Admitting: Internal Medicine

## 2017-09-04 VITALS — BP 136/84 | HR 75 | Temp 97.2°F | Resp 20 | Ht 68.0 in | Wt 211.0 lb

## 2017-09-04 DIAGNOSIS — Z7982 Long term (current) use of aspirin: Secondary | ICD-10-CM | POA: Diagnosis not present

## 2017-09-04 DIAGNOSIS — Z7984 Long term (current) use of oral hypoglycemic drugs: Secondary | ICD-10-CM | POA: Diagnosis not present

## 2017-09-04 DIAGNOSIS — Z800 Family history of malignant neoplasm of digestive organs: Secondary | ICD-10-CM | POA: Insufficient documentation

## 2017-09-04 DIAGNOSIS — I890 Lymphedema, not elsewhere classified: Secondary | ICD-10-CM | POA: Diagnosis not present

## 2017-09-04 DIAGNOSIS — R1011 Right upper quadrant pain: Secondary | ICD-10-CM | POA: Diagnosis not present

## 2017-09-04 DIAGNOSIS — Z923 Personal history of irradiation: Secondary | ICD-10-CM | POA: Insufficient documentation

## 2017-09-04 DIAGNOSIS — Z79899 Other long term (current) drug therapy: Secondary | ICD-10-CM | POA: Diagnosis not present

## 2017-09-04 DIAGNOSIS — Z79811 Long term (current) use of aromatase inhibitors: Secondary | ICD-10-CM | POA: Insufficient documentation

## 2017-09-04 DIAGNOSIS — C50411 Malignant neoplasm of upper-outer quadrant of right female breast: Secondary | ICD-10-CM

## 2017-09-04 DIAGNOSIS — Z170 Estrogen receptor positive status [ER+]: Secondary | ICD-10-CM | POA: Diagnosis not present

## 2017-09-04 DIAGNOSIS — Z8041 Family history of malignant neoplasm of ovary: Secondary | ICD-10-CM | POA: Diagnosis not present

## 2017-09-04 LAB — CBC WITH DIFFERENTIAL/PLATELET
Basophils Absolute: 0.1 10*3/uL (ref 0–0.1)
Basophils Relative: 1 %
EOS ABS: 0.2 10*3/uL (ref 0–0.7)
Eosinophils Relative: 3 %
HCT: 43.5 % (ref 35.0–47.0)
HEMOGLOBIN: 15 g/dL (ref 12.0–16.0)
LYMPHS ABS: 1.5 10*3/uL (ref 1.0–3.6)
Lymphocytes Relative: 27 %
MCH: 31 pg (ref 26.0–34.0)
MCHC: 34.4 g/dL (ref 32.0–36.0)
MCV: 90.2 fL (ref 80.0–100.0)
MONOS PCT: 6 %
Monocytes Absolute: 0.3 10*3/uL (ref 0.2–0.9)
NEUTROS PCT: 63 %
Neutro Abs: 3.3 10*3/uL (ref 1.4–6.5)
Platelets: 230 10*3/uL (ref 150–440)
RBC: 4.83 MIL/uL (ref 3.80–5.20)
RDW: 13.4 % (ref 11.5–14.5)
WBC: 5.4 10*3/uL (ref 3.6–11.0)

## 2017-09-04 LAB — COMPREHENSIVE METABOLIC PANEL
ALBUMIN: 4 g/dL (ref 3.5–5.0)
ALT: 17 U/L (ref 14–54)
AST: 25 U/L (ref 15–41)
Alkaline Phosphatase: 72 U/L (ref 38–126)
Anion gap: 10 (ref 5–15)
BUN: 12 mg/dL (ref 6–20)
CHLORIDE: 100 mmol/L — AB (ref 101–111)
CO2: 25 mmol/L (ref 22–32)
CREATININE: 0.87 mg/dL (ref 0.44–1.00)
Calcium: 9.1 mg/dL (ref 8.9–10.3)
GFR calc Af Amer: >60 mL/min (ref >60)
GFR calc non Af Amer: >60 mL/min (ref >60)
Glucose, Bld: 138 mg/dL — ABNORMAL HIGH (ref 65–99)
Potassium: 3.9 mmol/L (ref 3.5–5.1)
SODIUM: 135 mmol/L (ref 135–145)
Total Bilirubin: 0.6 mg/dL (ref 0.3–1.2)
Total Protein: 7.7 g/dL (ref 6.5–8.1)

## 2017-09-04 NOTE — Assessment & Plan Note (Addendum)
#  2017 right breast cancer stage I ER/PR positive HER-2/neu negative status post lumpectomy. No chemotherapy. Status post adjuvant radiation.   # On Femara. Tolerating well without any major side effects; however-abdominal pain/right chest wall swelling [see discussion below.].  If no clear etiology noted for the abdominal pain-PET scan would be recommended.  # right chest wall swelling likely secondary to lymphedema from previous radiation recommend physical therapy.  It is unclear/unlikely if this is related to her ongoing abdominal pain  #Abdominal pain right upper quadrant-recent CT scan negative for any metastasis.  Question musculoskeletal versus others; MRI thoracic spine pending this week.  Also awaiting GI evaluation tomorrow.  # Bone density test April 2017 within normal limits. Continue calcium and vitamin D.  # follow up in 3 weeks/no labs. Will await on GI eval/MRI thoracic spine; if negative would recommend PET scan.

## 2017-09-04 NOTE — Progress Notes (Signed)
Pt reports persistent RUQ pain worked up by pcp and Dr. Bary Castilla. Pt has a ct scan of abd/pelvis on 12/21 and has f/u imaging of her spine scheduled for this week. Patient denies any NVD. She denies any breast lumps. Per patient, surgeon order her f/u mammo in May 2019.

## 2017-09-04 NOTE — Progress Notes (Signed)
[facility] OFFICE PROGRESS NOTE  Patient Care Team: Jerrol Banana., MD as PCP - General (Family Medicine) Bary Castilla, Forest Gleason, MD (General Surgery) Cammie Sickle, MD as Consulting Physician (Internal Medicine) Lorelee Cover., MD as Consulting Physician (Ophthalmology) Noreene Filbert, MD as Referring Physician (Radiation Oncology)   SUMMARY OF HEMATOLOGIC/ONCOLOGIC HISTORY:  Oncology History   # FEB 2017- RIGHT BREAST  STAGE I [T1b N0 M0]. ER/PR ER positive 100% PR positive 95% HER 2 FISH negative;  INVASIVE MAMMARY CARCINOMA WITH MICROPAPILLARY FEATURES. S/p Lumpec [Dr.Byrnett] & RT; No Oncotype; Femara  # 2013- LEFT BREAST- STAGE II; ER/PR/ Her 2 NEG s/p Lumpec [Dr.Byrnett] & RT  # BMD- April  2017- wnl; genetic testing- declined [May 2017]  # Vertigo [uses rolling walker]     Carcinoma of upper-outer quadrant of right breast in female, estrogen receptor positive ([place])     INTERVAL HISTORY:   A very pleasant 77 year old African-American female patient with above history of breast cancer most recently diagnosed in February 2017 is here for follow-up; patient currently on Femara.  Patient noted to have worsening abdominal pain upper middle/right quadrant radiates to the back at times shoulders.  Worse with movement.  Hurts all day-at worst 8-9 a scale of 10; denies any nausea vomiting loss of appetite.  No weight loss.  A CT scan in December 2018 of the abdomen pelvis negative for any metastatic disease.  Her arthritis hot flashes are stable/improved.  Denies any headaches.  REVIEW OF SYSTEMS:  A complete 10 point review of system is done which is negative except mentioned above/history of present illness.   PAST MEDICAL HISTORY :  Past Medical History:  Diagnosis Date  . Breast cancer ([place])   . Breast cancer of upper-inner quadrant of right female breast ([place]) 09/21/2015   T1bN0 " 8m; ER100%, PR 90%, HER-2/neu not overexpressed.  .  Cystitis   . Diabetes mellitus without complication (H[facility])    NO MEDS-DIET CONTROLLED  . GERD (gastroesophageal reflux disease)   . Hernia 2011  . Hypertension   . Malignant neoplasm of upper-inner quadrant of female breast ([place] 08/17/2011   Left, T2 (2.3 cm) N0, triple negative. Chemotherapy/post wide excision whole breast radiation.  . 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 Personal history of chemotherapy 2013  . Personal history of malignant neoplasm of breast   . Personal history of radiation therapy 2017    Rt  . Vertigo     PAST SURGICAL HISTORY :   Past Surgical History:  Procedure Laterality Date  . BREAST BIOPSY Left 2013   +  . BREAST EXCISIONAL BIOPSY Right 2017   +  . BREAST LUMPECTOMY WITH SENTINEL LYMPH NODE BIOPSY Right 09/30/2015   Procedure: BREAST LUMPECTOMY WITH SENTINEL LYMPH NODE BX;  Surgeon: JRobert Bellow MD;  Location: [facility] ORS;  Service: General;  Laterality: Right;  . BREAST SURGERY Left 2012   wide excision  . BREAST SURGERY Left November 2013   Core biopsy of the upper-outer quadrant showed fat necrosis.  . CHOLECYSTECTOMY  2007  . COLONOSCOPY  2011   Dr OCandace Cruise . COLONOSCOPY WITH PROPOFOL N/A 02/25/2015   Procedure: COLONOSCOPY WITH PROPOFOL;  Surgeon: PHulen Luster MD;  [facility];  Service: Gastroenterology;  Laterality: N/A;  . DILATION AND CURETTAGE OF UTERUS  2004  . HERNIA REPAIR Right 070/62/3762  Umbilical/ventral hernia repaired with 6.4 cm Proceed ventral patch  .  PORT-A-CATH REMOVAL    . PORTACATH PLACEMENT  2013  . 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  Marland Kitchen UPPER GI ENDOSCOPY  2011    FAMILY HISTORY :   Family History  Problem Relation Age of Onset  . Lymphoma Father   . Ovarian cancer Sister   . Colon cancer Brother   . Lymphoma Brother   . Cancer Other         stomach  . Cancer Other        stomach  . Breast cancer Neg Hx     SOCIAL HISTORY:   Social History   Tobacco Use  . Smoking status: Never Smoker  . Smokeless tobacco: Never Used  Substance Use Topics  . Alcohol use: No  . Drug use: No    ALLERGIES:  has No Known Allergies.  MEDICATIONS:  Current Outpatient Medications  Medication Sig Dispense Refill  . ACCU-CHEK FASTCLIX LANCETS MISC Check sugar once daily DX E11.9 102 each 3  . acetaminophen (TYLENOL) 500 MG tablet Take 500 mg by mouth every 6 (six) hours as needed for mild pain or headache.     Marland Kitchen acyclovir (ZOVIRAX) 200 MG capsule TAKE 1 CAPSULE EVERY DAY 90 capsule 3  . Alcohol Swabs (B-D SINGLE USE SWABS REGULAR) PADS Check sugar once daily DX E11.9 100 each 3  . aspirin EC 81 MG tablet Take 81 mg by mouth daily.    Marland Kitchen atorvastatin (LIPITOR) 10 MG tablet TAKE 1 TABLET EVERY EVENING 90 tablet 3  . bimatoprost (LUMIGAN) 0.01 % SOLN Place 1 drop into both eyes at bedtime.     . Blood Glucose Calibration (ACCU-CHEK SMARTVIEW CONTROL) LIQD     . Calcium Carb-Cholecalciferol (CALCIUM 600+D3) 600-800 MG-UNIT TABS Take 1 tablet by mouth 2 (two) times daily.    . cholecalciferol (VITAMIN D) 1000 units tablet Take 1,000 Units by mouth daily.    . cyanocobalamin 500 MCG tablet Take 500 mcg by mouth daily.    . diazepam (VALIUM) 5 MG tablet 1 to 2 tablets prior to MRI 2 tablet 0  . escitalopram (LEXAPRO) 10 MG tablet Take 1 tablet (10 mg total) by mouth daily. 30 tablet 12  . gabapentin (NEURONTIN) 300 MG capsule Take 2 capsules (600 mg total) by mouth at bedtime. 180 capsule 3  . glucose blood (ACCU-CHEK SMARTVIEW) test strip Check sugar once daily DX E11.9 100 each 3  . latanoprost (XALATAN) 0.005 % ophthalmic solution Place 1 drop into both eyes at bedtime.     Marland Kitchen letrozole (FEMARA) 2.5 MG tablet TAKE ONE TABLET BY MOUTH ONCE DAILY 30 tablet 11  . metFORMIN (GLUCOPHAGE) 500 MG tablet Take 1 tablet (500 mg total) by mouth 2 (two)  times daily with a meal. 180 tablet 3  . metoprolol tartrate (LOPRESSOR) 25 MG tablet TAKE 1/2 TABLET TWICE DAILY 90 tablet 3  . Multiple Vitamin (MULTIVITAMIN WITH MINERALS) TABS tablet Take 1 tablet by mouth daily.    Marland Kitchen omega-3 acid ethyl esters (LOVAZA) 1 g capsule Take 3 g by mouth daily.     . pantoprazole (PROTONIX) 40 MG tablet Take 1 tablet (40 mg total) by mouth 2 (two) times daily. 60 tablet 5  . pyridoxine (B-6) 100 MG tablet Take 100 mg by mouth daily.    . vitamin C (ASCORBIC ACID) 500 MG tablet Take 1,000 mg by mouth daily.    Marland Kitchen  vitamin E 400 UNIT capsule Take 400 Units by mouth daily.    . meclizine (ANTIVERT) 25 MG tablet Take 0.5 tablets (12.5 mg total) by mouth 3 (three) times daily as needed for dizziness. (Patient not taking: Reported on 08/09/2017) 15 tablet 0  . meloxicam (MOBIC) 15 MG tablet TAKE 1 TABLET EVERY DAY (Patient not taking: Reported on 08/09/2017) 90 tablet 3   No current facility-administered medications for this visit.     PHYSICAL EXAMINATION: ECOG PERFORMANCE STATUS: 0 - Asymptomatic  BP 136/84 (Patient Position: Sitting)   Pulse 75   Temp (!) 97.2 F (36.2 C) (Tympanic)   Resp 20   Ht '5\' 8"'  (1.727 m)   Wt 211 lb (95.7 kg)   BMI 32.08 kg/m   Filed Weights   09/04/17 1126  Weight: 211 lb (95.7 kg)    GENERAL: Well-nourished well-developed; Alert, no distress and comfortable.   Alone.  EYES: no pallor or icterus OROPHARYNX: no thrush or ulceration; good dentition  NECK: supple, no masses felt LYMPH:  no palpable lymphadenopathy in the cervical, axillary or inguinal regions LUNGS: clear to auscultation and  No wheeze or crackles HEART/CVS: regular rate & rhythm and no murmurs; No lower extremity edema ABDOMEN:abdomen soft, question tenderness right/upper middle quadrants and normal bowel sounds Musculoskeletal:no cyanosis of digits and no clubbing; swelling noted of the right chest wall PSYCH: alert & oriented x 3 with fluent speech NEURO: no  focal motor/sensory deficits SKIN:  no rashes or significant lesions     LABORATORY DATA:  I have reviewed the data as listed    Component Value Date/Time   NA 135 09/04/2017 1100   NA 142 08/23/2017 1158   NA 140 12/23/2012 0949   K 3.9 09/04/2017 1100   K 4.7 12/23/2012 0949   CL 100 (L) 09/04/2017 1100   CL 101 12/23/2012 0949   CO2 25 09/04/2017 1100   CO2 30 12/23/2012 0949   GLUCOSE 138 (H) 09/04/2017 1100   GLUCOSE 168 (H) 12/23/2012 0949   BUN 12 09/04/2017 1100   BUN 10 08/23/2017 1158   BUN 9 12/23/2012 0949   CREATININE 0.87 09/04/2017 1100   CREATININE 0.92 09/04/2014 0916   CALCIUM 9.1 09/04/2017 1100   CALCIUM 9.8 12/23/2012 0949   PROT 7.7 09/04/2017 1100   PROT 7.4 08/23/2017 1158   PROT 7.4 09/04/2014 0916   ALBUMIN 4.0 09/04/2017 1100   ALBUMIN 4.5 08/23/2017 1158   ALBUMIN 3.7 09/04/2014 0916   AST 25 09/04/2017 1100   AST 21 09/04/2014 0916   ALT 17 09/04/2017 1100   ALT 34 09/04/2014 0916   ALKPHOS 72 09/04/2017 1100   ALKPHOS 85 09/04/2014 0916   BILITOT 0.6 09/04/2017 1100   BILITOT <0.2 08/23/2017 1158   BILITOT 0.4 09/04/2014 0916   GFRNONAA >60 09/04/2017 1100   GFRNONAA >60 09/04/2014 0916   GFRNONAA >60 02/18/2014 1048   GFRAA >60 09/04/2017 1100   GFRAA >60 09/04/2014 0916   GFRAA >60 02/18/2014 1048    No results found for: SPEP, UPEP  Lab Results  Component Value Date   WBC 5.4 09/04/2017   NEUTROABS 3.3 09/04/2017   HGB 15.0 09/04/2017   HCT 43.5 09/04/2017   MCV 90.2 09/04/2017   PLT 230 09/04/2017      Chemistry      Component Value Date/Time   NA 135 09/04/2017 1100   NA 142 08/23/2017 1158   NA 140 12/23/2012 0949   K 3.9 09/04/2017 1100  K 4.7 12/23/2012 0949   CL 100 (L) 09/04/2017 1100   CL 101 12/23/2012 0949   CO2 25 09/04/2017 1100   CO2 30 12/23/2012 0949   BUN 12 09/04/2017 1100   BUN 10 08/23/2017 1158   BUN 9 12/23/2012 0949   CREATININE 0.87 09/04/2017 1100   CREATININE 0.92 09/04/2014 0916       Component Value Date/Time   CALCIUM 9.1 09/04/2017 1100   CALCIUM 9.8 12/23/2012 0949   ALKPHOS 72 09/04/2017 1100   ALKPHOS 85 09/04/2014 0916   AST 25 09/04/2017 1100   AST 21 09/04/2014 0916   ALT 17 09/04/2017 1100   ALT 34 09/04/2014 0916   BILITOT 0.6 09/04/2017 1100   BILITOT <0.2 08/23/2017 1158   BILITOT 0.4 09/04/2014 0916        ASSESSMENT & PLAN:   Carcinoma of upper-outer quadrant of right breast in female, estrogen receptor positive ([place]) # 2017 right breast cancer stage I ER/PR positive HER-2/neu negative status post lumpectomy. No chemotherapy. Status post adjuvant radiation.   # On Femara. Tolerating well without any major side effects; however-abdominal pain/right chest wall swelling [see discussion below.].  If no clear etiology noted for the abdominal pain-PET scan would be recommended.  # right chest wall swelling likely secondary to lymphedema from previous radiation recommend physical therapy.  It is unclear/unlikely if this is related to her ongoing abdominal pain  #Abdominal pain right upper quadrant-recent CT scan negative for any metastasis.  Question musculoskeletal versus others; MRI thoracic spine pending this week.  Also awaiting GI evaluation tomorrow.  # Bone density test April 2017 within normal limits. Continue calcium and vitamin D.  # follow up in 3 weeks/no labs. Will await on GI eval/MRI thoracic spine; if negative would recommend PET scan.      Cammie Sickle, MD 09/04/2017 2:54 PM

## 2017-09-05 ENCOUNTER — Ambulatory Visit: Payer: Medicare HMO | Admitting: Gastroenterology

## 2017-09-05 ENCOUNTER — Other Ambulatory Visit: Payer: Self-pay

## 2017-09-05 ENCOUNTER — Encounter: Payer: Self-pay | Admitting: Gastroenterology

## 2017-09-05 VITALS — BP 127/76 | HR 76 | Ht 68.0 in | Wt 209.8 lb

## 2017-09-05 DIAGNOSIS — R1084 Generalized abdominal pain: Secondary | ICD-10-CM | POA: Diagnosis not present

## 2017-09-05 LAB — CANCER ANTIGEN 27.29: CAN 27.29: 17.7 U/mL (ref 0.0–38.6)

## 2017-09-05 NOTE — Progress Notes (Signed)
Leah Schaefer [address]  [place]  [place], [place] [address]  Main: [phone]  Fax: [phone]   Gastroenterology Consultation  Referring Provider:     Jerrol Banana.,* Primary Care Physician:  Jerrol Banana., MD Primary Gastroenterologist:  Dr. [name] Reason for Consultation:     Right-sided abdominal pain        HPI:   Leah Schaefer is a 77 y.o. y/o female referred for consultation & management  by Dr. Rosanna Randy, Retia Passe., MD.  Patient reports 64-monthhistory of right-sided abdominal pain.  Points to her umbilicus, right upper, right lower, right mid quadrant when asked about the location of the pain.  States this radiates to the back.  It is intermittent, cramping, 5/10, occurs daily.  It is not related to meals.  States it worsens with certain movements, and improves after a bowel movement.  No weight loss.  No dysphagia.  No heartburn.  No blood in stool.  Patient had a CT scan in December 2018, that showed no acute abdominal or pelvic pathology.  Moderate amount of stool throughout the colon.  Patient does not take anything for bowel movements.  Reports 1-2 soft bowel movements a day.  No blood in stool.  Last colonoscopy was in June 2016 that was reported to be normal, extent of exam cecum, and repeat was recommended in 5 years.  June 2011 colonoscopy, showed a fair prep, normal otherwise, repeat recommended in 5 years.  EGD June 2011 showed a hiatal hernia.  The indication for the colonoscopy and EGD at that time mentions abdominal pain.  October 2008 colonoscopy done for average risk screening, reports an 8 mm and 9 mm polyp removed from the sigmoid colon and transverse colon.  Pathology report not available.  Patient has had a cholecystectomy in the past, and CT did not report any biliary duct dilation.  Labs show normal liver enzymes.  Past Medical History:  Diagnosis Date  . Breast cancer ([place]   . Breast cancer of  upper-inner quadrant of right female breast ([place] 09/21/2015   T1bN0 " 132m ER100%, PR 90%, HER-2/neu not overexpressed.  . Cystitis   . Diabetes mellitus without complication ([facility])    NO MEDS-DIET CONTROLLED  . GERD (gastroesophageal reflux disease)   . Hernia 2011  . Hypertension   . Malignant ne[place] of upper-inner quadrant of female breast ([place]   Left, T2 (2.3 cm) N0, triple negative. Chemotherapy/post wide excision whole breast radiation.  . 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 Personal history of chemotherapy 2013  . Personal history of malignant neoplasm of breast   . Personal history of radiation therapy 2017    Rt  . Vertigo     Past Surgical History:  Procedure Laterality Date  . BREAST BIOPSY Left 2013   +  . BREAST EXCISIONAL BIOPSY Right 2017   +  . BREAST LUMPECTOMY WITH SENTINEL LYMPH NODE BIOPSY Right 09/30/2015   Procedure: BREAST LUMPECTOMY WITH SENTINEL LYMPH NODE BX;  Surgeon: JeRobert BellowMD;  Location: [facility] ORS;  Service: General;  Laterality: Right;  . BREAST SURGERY Left 2012   wide excision  . BREAST SURGERY Left November 2013   Core biopsy of the upper-outer quadrant showed fat necrosis.  . CHOLECYSTECTOMY  2007  . COLONOSCOPY  2011   Dr OhCandace Cruise. COLONOSCOPY WITH PROPOFOL N/A 02/25/2015   Procedure: COLONOSCOPY WITH PROPOFOL;  Surgeon:  Hulen Luster, MD;  [facility];  Service: Gastroenterology;  Laterality: N/A;  . DILATION AND CURETTAGE OF UTERUS  2004  . HERNIA REPAIR Right 81/08/7508   Umbilical/ventral hernia repaired with 6.4 cm Proceed ventral patch  . PORT-A-CATH REMOVAL    . PORTACATH PLACEMENT  2013  . 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  Marland Kitchen UPPER GI ENDOSCOPY  2011    Prior to Admission medications   Medication Sig Start Date End Date Taking? Authorizing  Provider  ACCU-CHEK FASTCLIX LANCETS MISC Check sugar once daily DX E11.9 05/02/17   Jerrol Banana., MD  acetaminophen (TYLENOL) 500 MG tablet Take 500 mg by mouth every 6 (six) hours as needed for mild pain or headache.     [provider]  acyclovir (ZOVIRAX) 200 MG capsule TAKE 1 CAPSULE EVERY DAY 12/07/16   Jerrol Banana., MD  Alcohol Swabs (B-D SINGLE USE SWABS REGULAR) PADS Check sugar once daily DX E11.9 05/02/17   Jerrol Banana., MD  aspirin EC 81 MG tablet Take 81 mg by mouth daily.    [provider]  atorvastatin (LIPITOR) 10 MG tablet TAKE 1 TABLET EVERY EVENING 12/07/16   Jerrol Banana., MD  bimatoprost (LUMIGAN) 0.01 % SOLN Place 1 drop into both eyes at bedtime.     [provider]  Blood Glucose Calibration (ACCU-CHEK SMARTVIEW CONTROL) LIQD  01/10/16   [provider]  Calcium Carb-Cholecalciferol (CALCIUM 600+D3) 600-800 MG-UNIT TABS Take 1 tablet by mouth 2 (two) times daily.    [provider]  cholecalciferol (VITAMIN D) 1000 units tablet Take 1,000 Units by mouth daily.    [provider]  cyanocobalamin 500 MCG tablet Take 500 mcg by mouth daily.    [provider]  diazepam (VALIUM) 5 MG tablet 1 to 2 tablets prior to MRI 09/03/17   Jerrol Banana., MD  escitalopram (LEXAPRO) 10 MG tablet Take 1 tablet (10 mg total) by mouth daily. 02/20/17   Jerrol Banana., MD  gabapentin (NEURONTIN) 300 MG capsule Take 2 capsules (600 mg total) by mouth at bedtime. 08/16/17   Jerrol Banana., MD  glucose blood [facility]) test strip Check sugar once daily DX E11.9 05/02/17   Jerrol Banana., MD  latanoprost (XALATAN) 0.005 % ophthalmic solution Place 1 drop into both eyes at bedtime.  10/11/16   [provider]  letrozole ([place]) 2.5 MG tablet TAKE ONE TABLET BY MOUTH ONCE DAILY 11/09/16   Robert Bellow, MD  meclizine (ANTIVERT) 25 MG tablet Take 0.5 tablets  (12.5 mg total) by mouth 3 (three) times daily as needed for dizziness. Patient not taking: Reported on 08/09/2017 10/27/15   Hinda Kehr, MD  meloxicam [facility]) 15 MG tablet TAKE 1 TABLET EVERY DAY Patient not taking: Reported on 08/09/2017 12/07/16   Jerrol Banana., MD  metFORMIN (GLUCOPHAGE) 500 MG tablet Take 1 tablet (500 mg total) by mouth 2 (two) times daily with a meal. 08/09/17   Jerrol Banana., MD  metoprolol tartrate (LOPRESSOR) 25 MG tablet TAKE 1/2 TABLET TWICE DAILY 12/07/16   Jerrol Banana., MD  Multiple Vitamin (MULTIVITAMIN WITH MINERALS) TABS tablet Take 1 tablet by mouth daily.    [provider]  omega-3 acid ethyl esters (LOVAZA) 1 g capsule Take 3 g by mouth  daily.     [provider]  pantoprazole (PROTONIX) 40 MG tablet Take 1 tablet (40 mg total) by mouth 2 (two) times daily. 08/09/17   Jerrol Banana., MD  pyridoxine (B-6) 100 MG tablet Take 100 mg by mouth daily.    [provider]  vitamin C (ASCORBIC ACID) 500 MG tablet Take 1,000 mg by mouth daily.    [provider]  vitamin E 400 UNIT capsule Take 400 Units by mouth daily.    [provider]    Family History  Problem Relation Age of Onset  . Lymphoma Father   . Ovarian cancer Sister   . Colon cancer Brother   . Lymphoma Brother   . Cancer Other        stomach  . Cancer Other        stomach  . Breast cancer Neg Hx      Social History   Tobacco Use  . Smoking status: Never Smoker  . Smokeless tobacco: Never Used  Substance Use Topics  . Alcohol use: No  . Drug use: No    Allergies as of 09/05/2017  . (No Known Allergies)    Review of Systems:    All systems reviewed and negative except where noted in HPI.   Physical Exam:  BP 127/76   Pulse 76   Ht '5\' 8"'  (1.727 m)   Wt 209 lb 12.8 oz (95.2 kg)   BMI 31.90 kg/m  No LMP recorded. Patient is postmenopausal. Psych:  Alert and cooperative. Normal mood and affect. General:    Alert,  Well-developed, well-nourished, pleasant and cooperative in NAD Head:  Normocephalic and atraumatic. Eyes:  Sclera clear, no icterus.   Conjunctiva pink. Ears:  Normal auditory acuity. Nose:  No deformity, discharge, or lesions. Mouth:  No deformity or lesions,oropharynx pink & moist. Neck:  Supple; no masses or thyromegaly. Lungs:  Respirations even and unlabored.  Clear throughout to auscultation.   No wheezes, crackles, or rhonchi. No acute distress. Heart:  Regular rate and rhythm; no murmurs, clicks, rubs, or gallops. Abdomen:  Normal bowel sounds.  No bruits.  Soft, tender to mild palpation right mid, and right upper quadrant. non-distended without masses, hepatosplenomegaly or hernias noted.  No guarding or rebound tenderness.    Msk:  Symmetrical without gross deformities. Good, equal movement & strength bilaterally. Pulses:  Normal pulses noted. Extremities:  No clubbing or edema.  No cyanosis. Neurologic:  Alert and oriented x3;  grossly normal neurologically. Skin:  Intact without significant lesions or rashes. No jaundice. Lymph Nodes:  No significant cervical adenopathy. Psych:  Alert and cooperative. Normal mood and affect.   Labs: CBC    Component Value Date/Time   WBC 5.4 09/04/2017 1100   RBC 4.83 09/04/2017 1100   HGB 15.0 09/04/2017 1100   HGB 15.2 08/23/2017 1158   HCT 43.5 09/04/2017 1100   HCT 44.4 08/23/2017 1158   PLT 230 09/04/2017 1100   PLT 264 08/23/2017 1158   MCV 90.2 09/04/2017 1100   MCV 90 08/23/2017 1158   MCV 91 09/04/2014 0916   MCH 31.0 09/04/2017 1100   MCHC 34.4 09/04/2017 1100   RDW 13.4 09/04/2017 1100   RDW 13.4 08/23/2017 1158   RDW 13.1 09/04/2014 0916   LYMPHSABS 1.5 09/04/2017 1100   LYMPHSABS 2.2 08/23/2017 1158   LYMPHSABS 1.5 09/04/2014 0916   MONOABS 0.3 09/04/2017 1100   MONOABS 0.4 09/04/2014 0916   EOSABS 0.2 09/04/2017 1100  EOSABS 0.1 08/23/2017 1158   EOSABS 0.2 09/04/2014 0916   BASOSABS 0.1 09/04/2017  1100   BASOSABS 0.0 08/23/2017 1158   BASOSABS 0.0 09/04/2014 0916   CMP     Component Value Date/Time   NA 135 09/04/2017 1100   NA 142 08/23/2017 1158   NA 140 12/23/2012 0949   K 3.9 09/04/2017 1100   K 4.7 12/23/2012 0949   CL 100 (L) 09/04/2017 1100   CL 101 12/23/2012 0949   CO2 25 09/04/2017 1100   CO2 30 12/23/2012 0949   GLUCOSE 138 (H) 09/04/2017 1100   GLUCOSE 168 (H) 12/23/2012 0949   BUN 12 09/04/2017 1100   BUN 10 08/23/2017 1158   BUN 9 12/23/2012 0949   CREATININE 0.87 09/04/2017 1100   CREATININE 0.92 09/04/2014 0916   CALCIUM 9.1 09/04/2017 1100   CALCIUM 9.8 12/23/2012 0949   PROT 7.7 09/04/2017 1100   PROT 7.4 08/23/2017 1158   PROT 7.4 09/04/2014 0916   ALBUMIN 4.0 09/04/2017 1100   ALBUMIN 4.5 08/23/2017 1158   ALBUMIN 3.7 09/04/2014 0916   AST 25 09/04/2017 1100   AST 21 09/04/2014 0916   ALT 17 09/04/2017 1100   ALT 34 09/04/2014 0916   ALKPHOS 72 09/04/2017 1100   ALKPHOS 85 09/04/2014 0916   BILITOT 0.6 09/04/2017 1100   BILITOT <0.2 08/23/2017 1158   BILITOT 0.4 09/04/2014 0916   GFRNONAA >60 09/04/2017 1100   GFRNONAA >60 09/04/2014 0916   GFRNONAA >60 02/18/2014 1048   GFRAA >60 09/04/2017 1100   GFRAA >60 09/04/2014 0916   GFRAA >60 02/18/2014 1048    Imaging Studies: December 2018 CT reviewed.  CT images reviewed as well.  Assessment and Plan:   SHANEYA TAKETA is a 77 y.o. y/o female has been referred for abdominal pain, with no alarm symptoms, with CT images reviewed and showing stool burden in the colon  Patient's pain may be related to constipation, given the stool burden on her colon on the recent CT scan She is having 1-2 soft bowel movements daily, however may not be evacuating completely Patient states her pain improves after a bowel movement, which is also consistent with constipation induced pain She does not take anything for her bowel movements, will start her on MiraLAX daily High-fiber diet We instructed her how  to titrate MiraLAX, to goal of 1-2 large bowel movements every day or every other day No alarm symptoms present to indicate endoscopy at this time See recent endoscopy results as stated in HPI If symptoms do not improve, EGD can be considered. Patient asked to call us if symptoms do not improve or worsen.  She verbalized understanding.  Mobic is listed on her medication list, but patient states she has not been taking it for a while, as her primary care physician discontinued this.  I have encouraged her to avoid NSAIDs.  No signs of active GI bleeding, and no acute anemia on recent lab work, thus no signs of underlying gastric ulcers present at this time.   Dr Leah Schaefer

## 2017-09-05 NOTE — Patient Instructions (Signed)
F/U  3 months 

## 2017-09-07 ENCOUNTER — Ambulatory Visit
Admission: RE | Admit: 2017-09-07 | Discharge: 2017-09-07 | Disposition: A | Discharge status: Home / Self Care | Payer: Medicare HMO | Source: Ambulatory Visit | Attending: Family Medicine | Admitting: Family Medicine

## 2017-09-07 ENCOUNTER — Other Ambulatory Visit: Payer: Medicare HMO

## 2017-09-07 DIAGNOSIS — M5114 Intervertebral disc disorders with radiculopathy, thoracic region: Secondary | ICD-10-CM | POA: Insufficient documentation

## 2017-09-07 DIAGNOSIS — M5136 Other intervertebral disc degeneration, lumbar region: Secondary | ICD-10-CM | POA: Insufficient documentation

## 2017-09-07 DIAGNOSIS — M4804 Spinal stenosis, thoracic region: Secondary | ICD-10-CM | POA: Diagnosis not present

## 2017-09-07 DIAGNOSIS — M5414 Radiculopathy, thoracic region: Secondary | ICD-10-CM

## 2017-09-07 DIAGNOSIS — M48061 Spinal stenosis, lumbar region without neurogenic claudication: Secondary | ICD-10-CM | POA: Insufficient documentation

## 2017-09-07 DIAGNOSIS — M5124 Other intervertebral disc displacement, thoracic region: Secondary | ICD-10-CM | POA: Diagnosis not present

## 2017-09-12 ENCOUNTER — Ambulatory Visit: Payer: Medicare HMO | Admitting: Occupational Therapy

## 2017-09-17 ENCOUNTER — Telehealth: Payer: Self-pay

## 2017-09-17 NOTE — Telephone Encounter (Signed)
-----   Message from Jerrol Banana., MD sent at 09/13/2017  3:08 PM EST ----- Some DDD disease--if better with pain do nothing--if still present start Gabapentin 100mg  BID and see me next week.

## 2017-09-17 NOTE — Telephone Encounter (Signed)
Patient advised as below.  

## 2017-09-24 ENCOUNTER — Other Ambulatory Visit: Payer: Self-pay | Admitting: Family Medicine

## 2017-09-24 DIAGNOSIS — E7800 Pure hypercholesterolemia, unspecified: Secondary | ICD-10-CM

## 2017-09-24 DIAGNOSIS — M545 Low back pain, unspecified: Secondary | ICD-10-CM

## 2017-09-24 DIAGNOSIS — I10 Essential (primary) hypertension: Secondary | ICD-10-CM

## 2017-09-25 ENCOUNTER — Ambulatory Visit (INDEPENDENT_AMBULATORY_CARE_PROVIDER_SITE_OTHER): Payer: Medicare HMO | Admitting: Family Medicine

## 2017-09-25 VITALS — BP 120/72 | HR 82 | Temp 98.3°F | Resp 16 | Wt 215.0 lb

## 2017-09-25 DIAGNOSIS — M5414 Radiculopathy, thoracic region: Secondary | ICD-10-CM

## 2017-09-25 DIAGNOSIS — E119 Type 2 diabetes mellitus without complications: Secondary | ICD-10-CM | POA: Diagnosis not present

## 2017-09-25 DIAGNOSIS — R1011 Right upper quadrant pain: Secondary | ICD-10-CM | POA: Diagnosis not present

## 2017-09-25 DIAGNOSIS — K5900 Constipation, unspecified: Secondary | ICD-10-CM | POA: Diagnosis not present

## 2017-09-25 NOTE — Progress Notes (Signed)
Patient: Leah Schaefer Female    DOB: 10-Jun-1941   76 y.o.   MRN: [id number] Visit Date: 09/25/2017  Today's Provider: [name], MD   Chief Complaint  Patient presents with  . Follow-up   Subjective:    HPI Pt is here for a follow up of abdominal pain. She has had her MRI, CT scan, and seen GI. She was started on Miralax because it was thought to be constipation. She reports that she does feel better and the pain is less severe. She also reports that when she gets in a hurry or she walks on the treadmill/exercises the pain is worse again. But over all pt does feel better. She also states that she can not help but worry that she has cancer again.       No Known Allergies   Current Outpatient Medications:  .  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  acetaminophen (TYLENOL) 500 MG tablet, Take 500 mg by mouth every 6 (six) hours as needed for mild pain or headache. , Disp: , Rfl:  .  acyclovir (ZOVIRAX) 200 MG capsule, TAKE 1 CAPSULE EVERY DAY, Disp: 90 capsule, Rfl: 3 .  Alcohol Swabs (B-D SINGLE USE SWABS REGULAR) PADS, Check sugar once daily DX E11.9, Disp: 100 each, Rfl: 3 .  aspirin EC 81 MG tablet, Take 81 mg by mouth daily., Disp: , Rfl:  .  atorvastatin (LIPITOR) 10 MG tablet, TAKE 1 TABLET EVERY EVENING, Disp: 90 tablet, Rfl: 3 .  bimatoprost (LUMIGAN) 0.01 % SOLN, Place 1 drop into both eyes at bedtime. , Disp: , Rfl:  .  Blood Glucose Calibration (ACCU-CHEK SMARTVIEW CONTROL) LIQD, , Disp: , Rfl:  .  Calcium Carb-Cholecalciferol (CALCIUM 600+D3) 600-800 MG-UNIT TABS, Take 1 tablet by mouth 2 (two) times daily., Disp: , Rfl:  .  cholecalciferol (VITAMIN D) 1000 units tablet, Take 1,000 Units by mouth daily., Disp: , Rfl:  .  cyanocobalamin 500 MCG tablet, Take 500 mcg by mouth daily., Disp: , Rfl:  .  diazepam (VALIUM) 5 MG tablet, 1 to 2 tablets prior to MRI, Disp: 2 tablet, Rfl: 0 .  escitalopram  (LEXAPRO) 10 MG tablet, Take 1 tablet (10 mg total) by mouth daily., Disp: 30 tablet, Rfl: 12 .  escitalopram (LEXAPRO) 20 MG tablet, TAKE 1 TABLET EVERY DAY, Disp: 90 tablet, Rfl: 3 .  gabapentin (NEURONTIN) 300 MG capsule, Take 2 capsules (600 mg total) by mouth at bedtime., Disp: 180 capsule, Rfl: 3 .  glucose blood (ACCU-CHEK SMARTVIEW) test strip, Check sugar once daily DX E11.9, Disp: 100 each, Rfl: 3 .  latanoprost (XALATAN) 0.005 % ophthalmic solution, Place 1 drop into both eyes at bedtime. , Disp: , Rfl:  .  letrozole (FEMARA) 2.5 MG tablet, TAKE ONE TABLET BY MOUTH ONCE DAILY, Disp: 30 tablet, Rfl: 11 .  meloxicam (MOBIC) 15 MG tablet, TAKE 1 TABLET EVERY DAY, Disp: 90 tablet, Rfl: 3 .  metFORMIN (GLUCOPHAGE) 500 MG tablet, Take 1 tablet (500 mg total) by mouth 2 (two) times daily with a meal., Disp: 180 tablet, Rfl: 3 .  metoprolol tartrate (LOPRESSOR) 25 MG tablet, TAKE 1/2 TABLET TWICE DAILY, Disp: 90 tablet, Rfl: 3 .  Multiple Vitamin (MULTIVITAMIN WITH MINERALS) TABS tablet, Take 1 tablet by mouth daily., Disp: , Rfl:  .  omega-3 acid ethyl esters (LOVAZA) 1 g capsule, Take 3 g by mouth daily. , Disp: ,  Rfl:  .  pantoprazole (PROTONIX) 40 MG tablet, Take 1 tablet (40 mg total) by mouth 2 (two) times daily., Disp: 60 tablet, Rfl: 5 .  pyridoxine (B-6) 100 MG tablet, Take 100 mg by mouth daily., Disp: , Rfl:  .  vitamin C (ASCORBIC ACID) 500 MG tablet, Take 1,000 mg by mouth daily., Disp: , Rfl:  .  vitamin E 400 UNIT capsule, Take 400 Units by mouth daily., Disp: , Rfl:  .  meclizine (ANTIVERT) 25 MG tablet, Take 0.5 tablets (12.5 mg total) by mouth 3 (three) times daily as needed for dizziness. (Patient not taking: Reported on 08/09/2017), Disp: 15 tablet, Rfl: 0  Review of Systems  Constitutional: Negative.   HENT: Negative.   Eyes: Negative.   Respiratory: Negative.   Cardiovascular: Negative.   Gastrointestinal: Positive for abdominal pain.  Endocrine: Negative.     Genitourinary: Negative.   Musculoskeletal: Negative.   Skin: Negative.   Allergic/Immunologic: Negative.   Neurological: Negative.   Hematological: Negative.   Psychiatric/Behavioral: Negative.     Social History   Tobacco Use  . Smoking status: Never Smoker  . Smokeless tobacco: Never Used  Substance Use Topics  . Alcohol use: No   Objective:   BP 120/72 (BP Location: Left Arm, Patient Position: Sitting, Cuff Size: Large)   Pulse 82   Temp 98.3 F (36.8 C) (Oral)   Resp 16   Wt 215 lb (97.5 kg)   BMI 32.69 kg/m  Vitals:   09/25/17 1141  BP: 120/72  Pulse: 82  Resp: 16  Temp: 98.3 F (36.8 C)  TempSrc: Oral  Weight: 215 lb (97.5 kg)     Physical Exam  Constitutional: She is oriented to person, place, and time. She appears well-developed and well-nourished.  HENT:  Head: Normocephalic and atraumatic.  Right Ear: External ear normal.  Left Ear: External ear normal.  Nose: Nose normal.  Eyes: Conjunctivae and EOM are normal. Pupils are equal, round, and reactive to light. No scleral icterus.  Neck: Normal range of motion. Neck supple. No thyromegaly present.  Cardiovascular: Normal rate, regular rhythm, normal heart sounds and intact distal pulses.  Pulmonary/Chest: Effort normal and breath sounds normal.  Abdominal: Soft. Bowel sounds are normal.  Musculoskeletal: Normal range of motion.  Neurological: She is alert and oriented to person, place, and time. She has normal reflexes.  Skin: Skin is warm and dry.  Psychiatric: She has a normal mood and affect. Her behavior is normal. Judgment and thought content normal.        Assessment & Plan:     1. RUQ pain Improved and with benign workup. Normal exam today.  2. Thoracic radiculopathy No further evaluation.  3. Constipation, unspecified constipation type Improving with Miralax  4. Type 2 diabetes mellitus without complication, without long-term current use of insulin ([place]) Follow up with A1c in 2  months     HPI, Exam, and A&P Transcribed under the direction and in the presence of Adanna Zuckerman L. Cranford Mon, MD  Electronically Signed: Katina Dung, CMA  I have done the exam and reviewed the above chart and it is accurate to the best of my knowledge. Development worker, community has been used in this note in any air is in the dictation or transcription are unintentional.  [name], MD  [place]

## 2017-09-26 ENCOUNTER — Other Ambulatory Visit: Payer: Self-pay

## 2017-09-26 ENCOUNTER — Inpatient Hospital Stay: Payer: Medicare HMO | Attending: Internal Medicine | Admitting: Internal Medicine

## 2017-09-26 DIAGNOSIS — Z7982 Long term (current) use of aspirin: Secondary | ICD-10-CM | POA: Diagnosis not present

## 2017-09-26 DIAGNOSIS — R42 Dizziness and giddiness: Secondary | ICD-10-CM | POA: Insufficient documentation

## 2017-09-26 DIAGNOSIS — Z7984 Long term (current) use of oral hypoglycemic drugs: Secondary | ICD-10-CM | POA: Insufficient documentation

## 2017-09-26 DIAGNOSIS — E119 Type 2 diabetes mellitus without complications: Secondary | ICD-10-CM | POA: Diagnosis not present

## 2017-09-26 DIAGNOSIS — C50411 Malignant neoplasm of upper-outer quadrant of right female breast: Secondary | ICD-10-CM | POA: Diagnosis not present

## 2017-09-26 DIAGNOSIS — K5900 Constipation, unspecified: Secondary | ICD-10-CM | POA: Diagnosis not present

## 2017-09-26 DIAGNOSIS — Z170 Estrogen receptor positive status [ER+]: Secondary | ICD-10-CM | POA: Diagnosis not present

## 2017-09-26 DIAGNOSIS — Z79899 Other long term (current) drug therapy: Secondary | ICD-10-CM | POA: Insufficient documentation

## 2017-09-26 DIAGNOSIS — Z79811 Long term (current) use of aromatase inhibitors: Secondary | ICD-10-CM | POA: Diagnosis not present

## 2017-09-26 NOTE — Progress Notes (Signed)
Here for follow up. Overall stated doing well.

## 2017-09-26 NOTE — Assessment & Plan Note (Addendum)
#  2017 right breast cancer stage I ER/PR positive HER-2/neu negative status post lumpectomy. No chemotherapy. Status post adjuvant radiation.   # On Femara. Tolerating well without any major side effects.  # Right chestwall pain sec to T10-T12 impingement; on MRI spine. Recommend PT [husband has cancer]  # Constipation- on stool softners; last colonoscopy- Dr.Oh recommend colo- again in 2021 summer.  Awaiting GI evaluation.  # Bone density test April 2017 within normal limits. Continue calcium and vitamin D.  # follow up in 4 months/MD/labs.

## 2017-09-26 NOTE — Progress Notes (Signed)
[facility] OFFICE PROGRESS NOTE  Patient Care Team: Jerrol Banana., MD as PCP - General (Family Medicine) Bary Castilla, Forest Gleason, MD (General Surgery) Cammie Sickle, MD as Consulting Physician (Internal Medicine) Lorelee Cover., MD as Consulting Physician (Ophthalmology) Noreene Filbert, MD as Referring Physician (Radiation Oncology)   SUMMARY OF HEMATOLOGIC/ONCOLOGIC HISTORY:  Oncology History   # FEB 2017- RIGHT BREAST  STAGE I [T1b N0 M0]. ER/PR ER positive 100% PR positive 95% HER 2 FISH negative;  INVASIVE MAMMARY CARCINOMA WITH MICROPAPILLARY FEATURES. S/p Lumpec [Dr.Byrnett] & RT; No Oncotype; Femara  # 2013- LEFT BREAST- STAGE II; ER/PR/ Her 2 NEG s/p Lumpec [Dr.Byrnett] & RT  # BMD- April  2017- wnl; genetic testing- declined [May 2017]  # Vertigo [uses rolling walker]     Carcinoma of upper-outer quadrant of right breast in female, estrogen receptor positive ([place])     INTERVAL HISTORY:   A very pleasant 77 year old African-American female patient with above history of breast cancer most recently diagnosed in February 2017 is here for follow-up; patient currently on Femara.  In the interim patient had a MRI of the back that showed "pinched nerve".   Her arthritis hot flashes are stable/improved.  Denies any headaches.  Complains of intermittent constipation.  Denies any blood in stools.  REVIEW OF SYSTEMS:  A complete 10 point review of system is done which is negative except mentioned above/history of present illness.   PAST MEDICAL HISTORY :  Past Medical History:  Diagnosis Date  . Breast cancer ([place])   . Breast cancer of upper-inner quadrant of right female breast ([place]) 09/21/2015   T1bN0 " 30m; ER100%, PR 90%, HER-2/neu not overexpressed.  . Cystitis   . Diabetes mellitus without complication ([facility])    NO MEDS-DIET CONTROLLED  . GERD (gastroesophageal reflux disease)   . Hernia 2011  . Hypertension   . Malignant neoplasm of  upper-inner quadrant of female breast ([place] 08/17/2011   Left, T2 (2.3 cm) N0, triple negative. Chemotherapy/post wide excision whole breast radiation.  . 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 Personal history of chemotherapy 2013  . Personal history of malignant neoplasm of breast   . Personal history of radiation therapy 2017    Rt  . Vertigo     PAST SURGICAL HISTORY :   Past Surgical History:  Procedure Laterality Date  . BREAST BIOPSY Left 2013   +  . BREAST EXCISIONAL BIOPSY Right 2017   +  . BREAST LUMPECTOMY WITH SENTINEL LYMPH NODE BIOPSY Right 09/30/2015   Procedure: BREAST LUMPECTOMY WITH SENTINEL LYMPH NODE BX;  Surgeon: JRobert Bellow MD;  Location: [facility] ORS;  Service: General;  Laterality: Right;  . BREAST SURGERY Left 2012   wide excision  . BREAST SURGERY Left November 2013   Core biopsy of the upper-outer quadrant showed fat necrosis.  . CHOLECYSTECTOMY  2007  . COLONOSCOPY  2011   Dr OCandace Cruise . COLONOSCOPY WITH PROPOFOL N/A 02/25/2015   Procedure: COLONOSCOPY WITH PROPOFOL;  Surgeon: PHulen Luster MD;  [facility];  Service: Gastroenterology;  Laterality: N/A;  . DILATION AND CURETTAGE OF UTERUS  2004  . HERNIA REPAIR Right 016/05/9603  Umbilical/ventral hernia repaired with 6.4 cm Proceed ventral patch  . PORT-A-CATH REMOVAL    . PORTACATH PLACEMENT  2013  . 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  Marland Kitchen UPPER GI ENDOSCOPY  2011    FAMILY HISTORY :   Family History  Problem Relation Age of Onset  . Lymphoma Father   . Ovarian cancer Sister   . Colon cancer Brother   . Lymphoma Brother   . Cancer Other        stomach  . Cancer Other        stomach  . Breast cancer Neg Hx     SOCIAL HISTORY:   Social History   Tobacco Use  . Smoking status: Never Smoker  . Smokeless tobacco: Never Used   Substance Use Topics  . Alcohol use: No  . Drug use: No    ALLERGIES:  has No Known Allergies.  MEDICATIONS:  Current Outpatient Medications  Medication Sig Dispense Refill  . ACCU-CHEK FASTCLIX LANCETS MISC Check sugar once daily DX E11.9 102 each 3  . acyclovir (ZOVIRAX) 200 MG capsule TAKE 1 CAPSULE EVERY DAY 90 capsule 3  . Alcohol Swabs (B-D SINGLE USE SWABS REGULAR) PADS Check sugar once daily DX E11.9 100 each 3  . aspirin EC 81 MG tablet Take 81 mg by mouth daily.    Marland Kitchen atorvastatin (LIPITOR) 10 MG tablet TAKE 1 TABLET EVERY EVENING 90 tablet 3  . bimatoprost (LUMIGAN) 0.01 % SOLN Place 1 drop into both eyes at bedtime.     . Blood Glucose Calibration (ACCU-CHEK SMARTVIEW CONTROL) LIQD     . Calcium Carb-Cholecalciferol (CALCIUM 600+D3) 600-800 MG-UNIT TABS Take 1 tablet by mouth 2 (two) times daily.    . cholecalciferol (VITAMIN D) 1000 units tablet Take 1,000 Units by mouth daily.    . cyanocobalamin 500 MCG tablet Take 500 mcg by mouth daily.    Marland Kitchen escitalopram (LEXAPRO) 10 MG tablet Take 1 tablet (10 mg total) by mouth daily. 30 tablet 12  . escitalopram (LEXAPRO) 20 MG tablet TAKE 1 TABLET EVERY DAY 90 tablet 3  . gabapentin (NEURONTIN) 300 MG capsule Take 2 capsules (600 mg total) by mouth at bedtime. 180 capsule 3  . glucose blood (ACCU-CHEK SMARTVIEW) test strip Check sugar once daily DX E11.9 100 each 3  . latanoprost (XALATAN) 0.005 % ophthalmic solution Place 1 drop into both eyes at bedtime.     Marland Kitchen letrozole (FEMARA) 2.5 MG tablet TAKE ONE TABLET BY MOUTH ONCE DAILY 30 tablet 11  . meclizine (ANTIVERT) 25 MG tablet Take 0.5 tablets (12.5 mg total) by mouth 3 (three) times daily as needed for dizziness. 15 tablet 0  . metFORMIN (GLUCOPHAGE) 500 MG tablet Take 1 tablet (500 mg total) by mouth 2 (two) times daily with a meal. 180 tablet 3  . metoprolol tartrate (LOPRESSOR) 25 MG tablet TAKE 1/2 TABLET TWICE DAILY 90 tablet 3  . Multiple Vitamin (MULTIVITAMIN WITH  MINERALS) TABS tablet Take 1 tablet by mouth daily.    Marland Kitchen omega-3 acid ethyl esters (LOVAZA) 1 g capsule Take 3 g by mouth daily.     . pantoprazole (PROTONIX) 40 MG tablet Take 1 tablet (40 mg total) by mouth 2 (two) times daily. 60 tablet 5  . pyridoxine (B-6) 100 MG tablet Take 100 mg by mouth daily.    . vitamin C (ASCORBIC ACID) 500 MG tablet Take 1,000 mg by mouth daily.    . vitamin E 400 UNIT capsule Take 400 Units by mouth daily.    Marland Kitchen acetaminophen (TYLENOL) 500 MG tablet Take 500 mg by mouth every 6 (six) hours as needed for mild  pain or headache.     . meloxicam (MOBIC) 15 MG tablet TAKE 1 TABLET EVERY DAY (Patient not taking: Reported on 09/26/2017) 90 tablet 3   No current facility-administered medications for this visit.     PHYSICAL EXAMINATION: ECOG PERFORMANCE STATUS: 0 - Asymptomatic  BP (!) 161/87 (BP Location: Left Arm, Patient Position: Sitting)   Pulse 76   Temp (!) 95.9 F (35.5 C) (Tympanic)   Resp 20   Wt 214 lb 6.4 oz (97.3 kg)   BMI 32.60 kg/m   Filed Weights   09/26/17 1340  Weight: 214 lb 6.4 oz (97.3 kg)    GENERAL: Well-nourished well-developed; Alert, no distress and comfortable.   Alone.  EYES: no pallor or icterus OROPHARYNX: no thrush or ulceration; good dentition  NECK: supple, no masses felt LYMPH:  no palpable lymphadenopathy in the cervical, axillary or inguinal regions LUNGS: clear to auscultation and  No wheeze or crackles HEART/CVS: regular rate & rhythm and no murmurs; No lower extremity edema ABDOMEN:abdomen soft, question tenderness right/upper middle quadrants and normal bowel sounds Musculoskeletal:no cyanosis of digits and no clubbing; swelling noted of the right chest wall PSYCH: alert & oriented x 3 with fluent speech NEURO: no focal motor/sensory deficits SKIN:  no rashes or significant lesions     LABORATORY DATA:  I have reviewed the data as listed    Component Value Date/Time   NA 135 09/04/2017 1100   NA 142  08/23/2017 1158   NA 140 12/23/2012 0949   K 3.9 09/04/2017 1100   K 4.7 12/23/2012 0949   CL 100 (L) 09/04/2017 1100   CL 101 12/23/2012 0949   CO2 25 09/04/2017 1100   CO2 30 12/23/2012 0949   GLUCOSE 138 (H) 09/04/2017 1100   GLUCOSE 168 (H) 12/23/2012 0949   BUN 12 09/04/2017 1100   BUN 10 08/23/2017 1158   BUN 9 12/23/2012 0949   CREATININE 0.87 09/04/2017 1100   CREATININE 0.92 09/04/2014 0916   CALCIUM 9.1 09/04/2017 1100   CALCIUM 9.8 12/23/2012 0949   PROT 7.7 09/04/2017 1100   PROT 7.4 08/23/2017 1158   PROT 7.4 09/04/2014 0916   ALBUMIN 4.0 09/04/2017 1100   ALBUMIN 4.5 08/23/2017 1158   ALBUMIN 3.7 09/04/2014 0916   AST 25 09/04/2017 1100   AST 21 09/04/2014 0916   ALT 17 09/04/2017 1100   ALT 34 09/04/2014 0916   ALKPHOS 72 09/04/2017 1100   ALKPHOS 85 09/04/2014 0916   BILITOT 0.6 09/04/2017 1100   BILITOT <0.2 08/23/2017 1158   BILITOT 0.4 09/04/2014 0916   GFRNONAA >60 09/04/2017 1100   GFRNONAA >60 09/04/2014 0916   GFRNONAA >60 02/18/2014 1048   GFRAA >60 09/04/2017 1100   GFRAA >60 09/04/2014 0916   GFRAA >60 02/18/2014 1048    No results found for: SPEP, UPEP  Lab Results  Component Value Date   WBC 5.4 09/04/2017   NEUTROABS 3.3 09/04/2017   HGB 15.0 09/04/2017   HCT 43.5 09/04/2017   MCV 90.2 09/04/2017   PLT 230 09/04/2017      Chemistry      Component Value Date/Time   NA 135 09/04/2017 1100   NA 142 08/23/2017 1158   NA 140 12/23/2012 0949   K 3.9 09/04/2017 1100   K 4.7 12/23/2012 0949   CL 100 (L) 09/04/2017 1100   CL 101 12/23/2012 0949   CO2 25 09/04/2017 1100   CO2 30 12/23/2012 0949   BUN 12 09/04/2017 1100  BUN 10 08/23/2017 1158   BUN 9 12/23/2012 0949   CREATININE 0.87 09/04/2017 1100   CREATININE 0.92 09/04/2014 0916      Component Value Date/Time   CALCIUM 9.1 09/04/2017 1100   CALCIUM 9.8 12/23/2012 0949   ALKPHOS 72 09/04/2017 1100   ALKPHOS 85 09/04/2014 0916   AST 25 09/04/2017 1100   AST 21  09/04/2014 0916   ALT 17 09/04/2017 1100   ALT 34 09/04/2014 0916   BILITOT 0.6 09/04/2017 1100   BILITOT <0.2 08/23/2017 1158   BILITOT 0.4 09/04/2014 0916     IMPRESSION: 1. Diffuse advanced disc degeneration and bulging without thoracic cord impingement. 2. Disc degeneration and facet hypertrophy causes notable high-grade foraminal stenosis on the left at T10-11 and T11-12. 3. On the symptomatic right side there is moderate foraminal stenosis at T11-12. 4. Right paracentral disc protrusions at T10-11 and T12-L1, which could be a source of root impingement. 5. L1-2 advanced disc degeneration with moderate canal and right foraminal stenosis.   Electronically Signed   By: Monte Fantasia M.D.   On: 09/07/2017 08:49   ASSESSMENT & PLAN:   Carcinoma of upper-outer quadrant of right breast in female, estrogen receptor positive ([place]) # 2017 right breast cancer stage I ER/PR positive HER-2/neu negative status post lumpectomy. No chemotherapy. Status post adjuvant radiation.   # On Femara. Tolerating well without any major side effects.  # Right chestwall pain sec to T10-T12 impingement; on MRI spine. Recommend PT [husband has cancer]  # Constipation- on stool softners; last colonoscopy- Dr.Oh recommend colo- again in 2021 summer.  Awaiting GI evaluation.  # Bone density test April 2017 within normal limits. Continue calcium and vitamin D.  # follow up in 4 months/MD/labs.       Cammie Sickle, MD 09/29/2017 11:39 PM

## 2017-10-12 ENCOUNTER — Other Ambulatory Visit: Payer: Medicare HMO

## 2017-10-12 ENCOUNTER — Ambulatory Visit: Payer: Medicare HMO | Admitting: Internal Medicine

## 2017-10-15 ENCOUNTER — Other Ambulatory Visit: Payer: Self-pay

## 2017-10-15 MED ORDER — LETROZOLE 2.5 MG PO TABS: 2.5000 mg | ORAL_TABLET | Freq: Every day | ORAL | 3 refills | Status: DC

## 2017-10-17 ENCOUNTER — Other Ambulatory Visit: Payer: Self-pay | Admitting: Family Medicine

## 2017-10-17 DIAGNOSIS — K2970 Gastritis, unspecified, without bleeding: Secondary | ICD-10-CM

## 2017-10-17 MED ORDER — PANTOPRAZOLE SODIUM 40 MG PO TBEC: 40.0000 mg | DELAYED_RELEASE_TABLET | Freq: Two times a day (BID) | ORAL | 3 refills | Status: DC

## 2017-10-17 NOTE — Telephone Encounter (Signed)
Sent to [employer]

## 2017-10-17 NOTE — Telephone Encounter (Signed)
[employer] was suppose to fax [place] a refill request for Pantoprazole 40 mg.   However, I do not see that in her chart.   She is in desperate need of it.  Can we send it in to [facility] ASAP?

## 2017-10-22 ENCOUNTER — Other Ambulatory Visit: Payer: Self-pay

## 2017-10-22 ENCOUNTER — Ambulatory Visit (INDEPENDENT_AMBULATORY_CARE_PROVIDER_SITE_OTHER): Payer: Medicare HMO | Admitting: Family Medicine

## 2017-10-22 VITALS — BP 140/78 | HR 74 | Temp 98.0°F | Resp 16

## 2017-10-22 DIAGNOSIS — I10 Essential (primary) hypertension: Secondary | ICD-10-CM | POA: Diagnosis not present

## 2017-10-22 DIAGNOSIS — F4322 Adjustment disorder with anxiety: Secondary | ICD-10-CM

## 2017-10-22 DIAGNOSIS — Z170 Estrogen receptor positive status [ER+]: Secondary | ICD-10-CM

## 2017-10-22 DIAGNOSIS — C50411 Malignant neoplasm of upper-outer quadrant of right female breast: Secondary | ICD-10-CM

## 2017-10-22 DIAGNOSIS — E119 Type 2 diabetes mellitus without complications: Secondary | ICD-10-CM | POA: Diagnosis not present

## 2017-10-22 LAB — POCT GLYCOSYLATED HEMOGLOBIN (HGB A1C): HEMOGLOBIN A1C: 7.1

## 2017-10-22 NOTE — Progress Notes (Signed)
Leah Schaefer  MRN: [id number] DOB: 02-09-1941  Subjective:  HPI   The patient is a 77 year old female who presents for 3 month follow up of diabetes.  She has been seen for abdominal pain a few times since her last diabetes check.  On her last visit which was in February her pain was improving and she had normal exam.  However she reports today that although it is improved she is still having daily pain.  Her constipation is no longer an issue but she does have pain in the RUQ.  Her last diabetic check was on 07/24/18.  Her A1C at that time was 7.0.  Her home readings have been in the 120's-160's  Patient Active Problem List   Diagnosis Date Noted  . Umbilical pain 40/98/1191  . Breast mass, left 04/07/2016  . Controlled type 2 diabetes mellitus without complication ([place]) 47/82/9562  . Allergic rhinitis 03/30/2015  . Anxiety 03/30/2015  . Carotid arterial disease ([place]) 03/30/2015  . Diabetes mellitus, type 2 ([place]) 03/30/2015  . Essential (primary) hypertension 03/30/2015  . Hypercholesteremia 03/30/2015  . Left leg pain 03/30/2015  . LBP (low back pain) 03/30/2015  . Neuropathic pain 03/30/2015  . Burning or prickling sensation 03/30/2015  . Neuralgia neuritis, sciatic nerve 03/30/2015  . Skin cyst 11/24/2014  . Carcinoma of upper-outer quadrant of right breast in female, estrogen receptor positive ([place]) 08/17/2011  . Allergic reaction 11/29/2009  . Adaptation reaction 04/30/2009  . Acid reflux 04/30/2009  . Stricture and stenosis of cervix 01/28/2009    Past Medical History:  Diagnosis Date  . Breast cancer ([place])   . Breast cancer of upper-inner quadrant of right female breast ([place]) 09/21/2015   T1bN0 " 54m; ER100%, PR 90%, HER-2/neu not overexpressed.  . Cystitis   . Diabetes mellitus without complication (HCC)    NO MEDS-DIET CONTROLLED  . GERD (gastroesophageal reflux disease)   . Hernia 2011  . Hypertension   . Malignant neoplasm of upper-inner quadrant of  female breast ([place] 08/17/2011   Left, T2 (2.3 cm) N0, triple negative. Chemotherapy/post wide excision whole breast radiation.  . 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 Personal history of chemotherapy 2013  . Personal history of malignant neoplasm of breast   . Personal history of radiation therapy 2017    Rt  . Vertigo     Social History   Socioeconomic History  . Marital status: Married    Spouse name: Not on file  . Number of children: 2  . Years of education: H/S  . Highest education level: Not on file  Social Needs  . Financial resource strain: Not on file  . Food insecurity - worry: Not on file  . Food insecurity - inability: Not on file  . Transportation needs - medical: Not on file  . Transportation needs - non-medical: Not on file  Occupational History  . Occupation: Retired  Tobacco Use  . Smoking status: Never Smoker  . Smokeless tobacco: Never Used  Substance and Sexual Activity  . Alcohol use: No  . Drug use: No  . Sexual activity: Not on file  Other Topics Concern  . Not on file  Social History Narrative  . Not on file    Outpatient Encounter Medications as of 10/22/2017  Medication Sig Note  . ACCU-CHEK FASTCLIX LANCETS MISC Check sugar once daily DX E11.9   . acetaminophen (TYLENOL) 500 MG tablet Take 500 mg by mouth  every 6 (six) hours as needed for mild pain or headache.    Marland Kitchen acyclovir (ZOVIRAX) 200 MG capsule TAKE 1 CAPSULE EVERY DAY   . Alcohol Swabs (B-D SINGLE USE SWABS REGULAR) PADS Check sugar once daily DX E11.9   . aspirin EC 81 MG tablet Take 81 mg by mouth daily.   Marland Kitchen atorvastatin (LIPITOR) 10 MG tablet TAKE 1 TABLET EVERY EVENING   . bimatoprost (LUMIGAN) 0.01 % SOLN Place 1 drop into both eyes at bedtime.    . Blood Glucose Calibration (ACCU-CHEK SMARTVIEW CONTROL) LIQD  01/25/2016: Received from: External Pharmacy  . Calcium Carb-Cholecalciferol (CALCIUM 600+D3) 600-800 MG-UNIT TABS Take 1 tablet by mouth  2 (two) times daily.   . cholecalciferol (VITAMIN D) 1000 units tablet Take 1,000 Units by mouth daily.   . cyanocobalamin 500 MCG tablet Take 500 mcg by mouth daily.   Marland Kitchen escitalopram (LEXAPRO) 10 MG tablet Take 1 tablet (10 mg total) by mouth daily.   Marland Kitchen escitalopram (LEXAPRO) 20 MG tablet TAKE 1 TABLET EVERY DAY   . gabapentin (NEURONTIN) 300 MG capsule Take 2 capsules (600 mg total) by mouth at bedtime.   Marland Kitchen glucose blood (ACCU-CHEK SMARTVIEW) test strip Check sugar once daily DX E11.9   . latanoprost (XALATAN) 0.005 % ophthalmic solution Place 1 drop into both eyes at bedtime.    Marland Kitchen letrozole (FEMARA) 2.5 MG tablet Take 1 tablet (2.5 mg total) by mouth daily.   . meclizine (ANTIVERT) 25 MG tablet Take 0.5 tablets (12.5 mg total) by mouth 3 (three) times daily as needed for dizziness.   . meloxicam (MOBIC) 15 MG tablet TAKE 1 TABLET EVERY DAY   . metFORMIN (GLUCOPHAGE) 500 MG tablet Take 1 tablet (500 mg total) by mouth 2 (two) times daily with a meal.   . metoprolol tartrate (LOPRESSOR) 25 MG tablet TAKE 1/2 TABLET TWICE DAILY   . Multiple Vitamin (MULTIVITAMIN WITH MINERALS) TABS tablet Take 1 tablet by mouth daily.   Marland Kitchen omega-3 acid ethyl esters (LOVAZA) 1 g capsule Take 3 g by mouth daily.    . pantoprazole (PROTONIX) 40 MG tablet Take 1 tablet (40 mg total) by mouth 2 (two) times daily.   Marland Kitchen pyridoxine (B-6) 100 MG tablet Take 100 mg by mouth daily.   . vitamin C (ASCORBIC ACID) 500 MG tablet Take 1,000 mg by mouth daily.   . vitamin E 400 UNIT capsule Take 400 Units by mouth daily.    No facility-administered encounter medications on file as of 10/22/2017.     No Known Allergies  Review of Systems  Constitutional: Negative for fever and malaise/fatigue.  HENT: Negative.   Eyes: Negative.   Respiratory: Negative for cough, shortness of breath and wheezing.   Cardiovascular: Negative for chest pain, palpitations and leg swelling.  Gastrointestinal: Positive for abdominal pain.  Negative for blood in stool, constipation, diarrhea, heartburn, melena, nausea and vomiting.       Pain improving.  Genitourinary: Negative for frequency.  Skin: Negative.   Neurological: Negative for weakness.  Endo/Heme/Allergies: Negative.  Negative for polydipsia.  Psychiatric/Behavioral: Negative.     Objective:  BP 140/78 (BP Location: Right Arm, Patient Position: Sitting, Cuff Size: Normal)   Pulse 74   Temp 98 F (36.7 C) (Oral)   Resp 16   Physical Exam  Constitutional: She is oriented to person, place, and time and well-developed, well-nourished, and in no distress.  HENT:  Head: Normocephalic and atraumatic.  Eyes: Conjunctivae are normal. No scleral icterus.  Neck: No thyromegaly present.  Cardiovascular: Normal rate, regular rhythm and normal heart sounds.  Pulmonary/Chest: Effort normal and breath sounds normal.  Abdominal: Soft.  Neurological: She is alert and oriented to person, place, and time.  Skin: Skin is warm and dry.  Psychiatric: Mood, memory, affect and judgment normal.    Assessment and Plan :  1. Controlled type 2 diabetes mellitus without complication, without long-term current use of insulin (HCC)  - POCT glycosylated hemoglobin (Hb A1C) 2.Anxiety Husband in failing health. 3.h/o Breast Cancer 4.RUQ abd pain Improving.negative workup.  I have done the exam and reviewed the chart and it is accurate to the best of my knowledge. Development worker, community has been used and  any errors in dictation or transcription are unintentional. [name] M.D. [facility]

## 2017-10-23 ENCOUNTER — Telehealth: Payer: Self-pay | Admitting: Gastroenterology

## 2017-10-23 NOTE — Telephone Encounter (Signed)
LVM for patient to call and reschedule a 3 month follow up with Dr. Bonna Gains

## 2017-10-24 ENCOUNTER — Encounter: Payer: Self-pay | Admitting: Gastroenterology

## 2017-10-24 ENCOUNTER — Telehealth: Payer: Self-pay | Admitting: Gastroenterology

## 2017-10-24 NOTE — Telephone Encounter (Signed)
LVM for patient to call and reschedule her appointment with Dr. Bonna Gains for a 3 month follow up.  Letter sent

## 2017-10-25 ENCOUNTER — Other Ambulatory Visit: Payer: Self-pay

## 2017-10-25 DIAGNOSIS — Z780 Asymptomatic menopausal state: Secondary | ICD-10-CM

## 2017-10-25 DIAGNOSIS — Z170 Estrogen receptor positive status [ER+]: Principal | ICD-10-CM

## 2017-10-25 DIAGNOSIS — C50211 Malignant neoplasm of upper-inner quadrant of right female breast: Secondary | ICD-10-CM

## 2017-11-19 ENCOUNTER — Ambulatory Visit: Payer: Medicare HMO | Admitting: Gastroenterology

## 2017-11-21 ENCOUNTER — Other Ambulatory Visit: Payer: Self-pay | Admitting: Family Medicine

## 2017-11-21 NOTE — Telephone Encounter (Signed)
Pt called saying she is out of the gabapentin 300mg    She uses the [facility] order  [place]

## 2017-11-22 MED ORDER — GABAPENTIN 300 MG PO CAPS: 600.0000 mg | ORAL_CAPSULE | Freq: Every day | ORAL | 3 refills | Status: DC

## 2017-11-26 ENCOUNTER — Ambulatory Visit: Payer: Medicare HMO | Admitting: Gastroenterology

## 2017-11-27 ENCOUNTER — Ambulatory Visit: Payer: Self-pay | Admitting: Family Medicine

## 2017-12-10 DIAGNOSIS — H524 Presbyopia: Secondary | ICD-10-CM | POA: Diagnosis not present

## 2017-12-10 DIAGNOSIS — E113393 Type 2 diabetes mellitus with moderate nonproliferative diabetic retinopathy without macular edema, bilateral: Secondary | ICD-10-CM | POA: Diagnosis not present

## 2017-12-18 ENCOUNTER — Ambulatory Visit
Admission: RE | Admit: 2017-12-18 | Discharge: 2017-12-18 | Disposition: A | Discharge status: Home / Self Care | Payer: Medicare HMO | Source: Ambulatory Visit | Attending: General Surgery | Admitting: General Surgery

## 2017-12-18 DIAGNOSIS — C50211 Malignant neoplasm of upper-inner quadrant of right female breast: Secondary | ICD-10-CM

## 2017-12-18 DIAGNOSIS — N6323 Unspecified lump in the left breast, lower outer quadrant: Secondary | ICD-10-CM | POA: Insufficient documentation

## 2017-12-18 DIAGNOSIS — Z170 Estrogen receptor positive status [ER+]: Principal | ICD-10-CM

## 2017-12-18 DIAGNOSIS — R922 Inconclusive mammogram: Secondary | ICD-10-CM | POA: Diagnosis not present

## 2017-12-18 DIAGNOSIS — N6322 Unspecified lump in the left breast, upper inner quadrant: Secondary | ICD-10-CM | POA: Diagnosis not present

## 2017-12-18 DIAGNOSIS — Z780 Asymptomatic menopausal state: Secondary | ICD-10-CM | POA: Diagnosis not present

## 2017-12-18 DIAGNOSIS — Z13820 Encounter for screening for osteoporosis: Secondary | ICD-10-CM | POA: Diagnosis not present

## 2017-12-21 IMAGING — MG MM DIAG BREAST TOMO BILATERAL
8 of 25 series · 8 of 40 positions shown · non-contrast
Comparison: Previous exam(s).

CLINICAL DATA: 74-year-old female with history of a left lumpectomy
in 2698. The patient received chemotherapy and radiation.

EXAM:
DIGITAL DIAGNOSTIC BILATERAL MAMMOGRAM WITH 3D TOMOSYNTHESIS WITH
CAD
ULTRASOUND BILATERAL BREAST

[L CC]
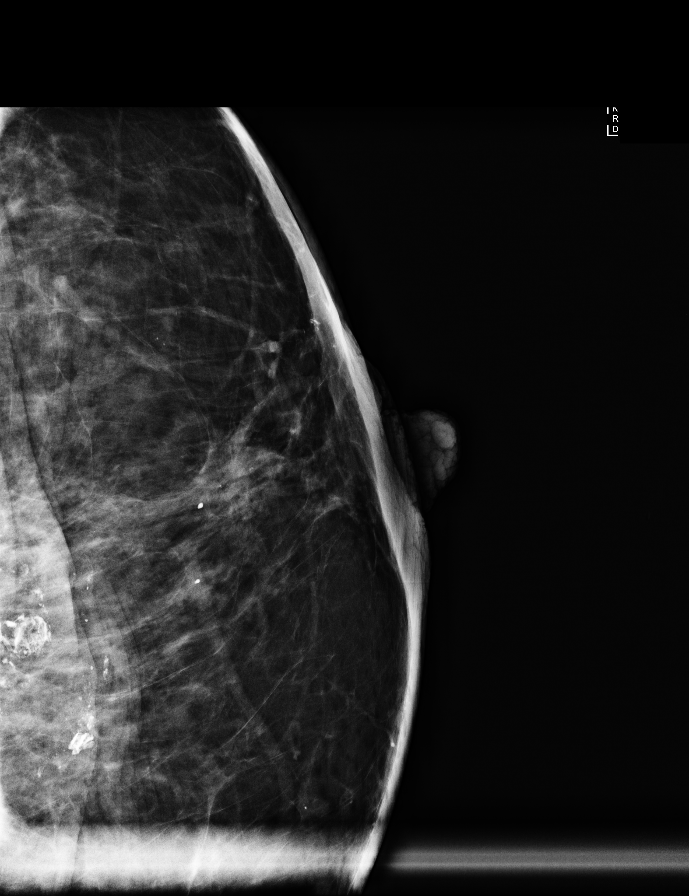

[L CC synth-2D (1 of 2)]
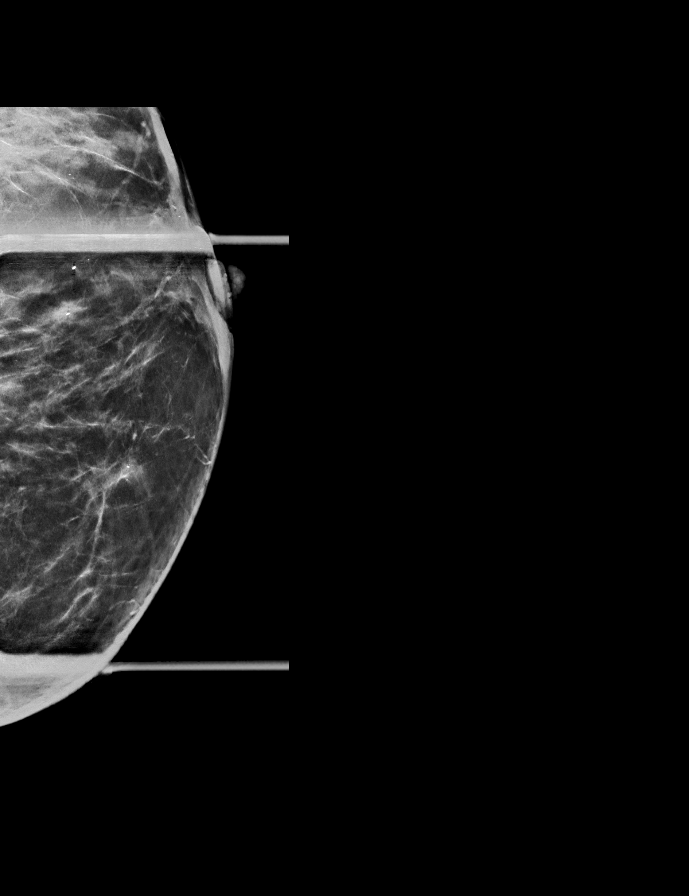

[R MLO]
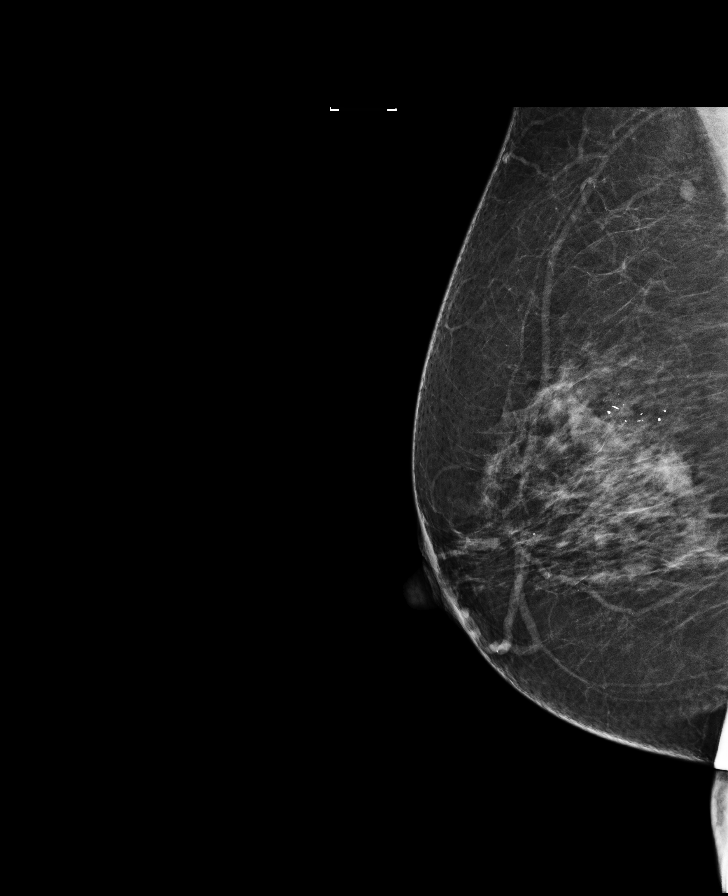

[R CC synth-2D]
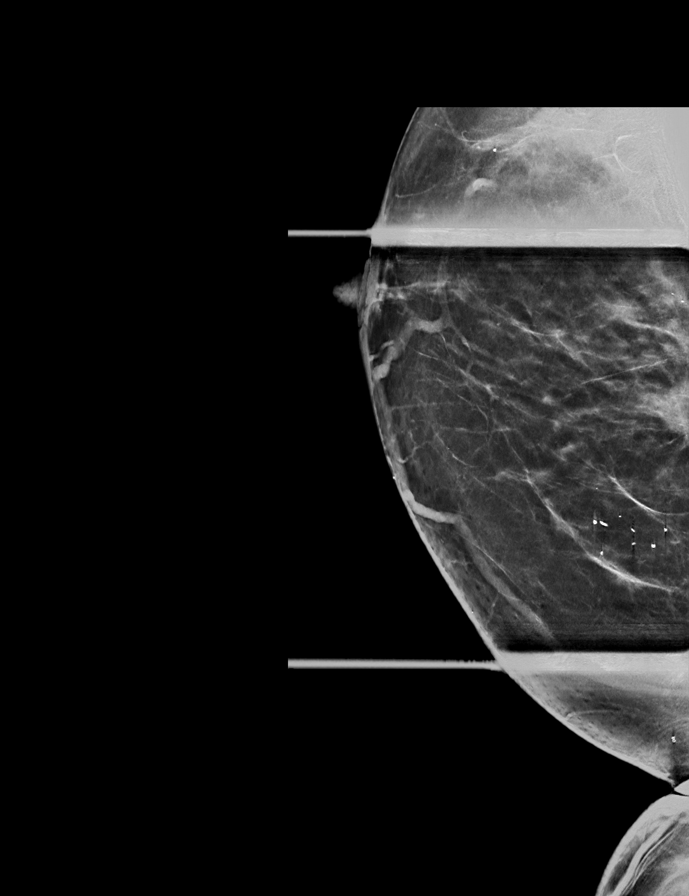

[L MLO synth-2D]
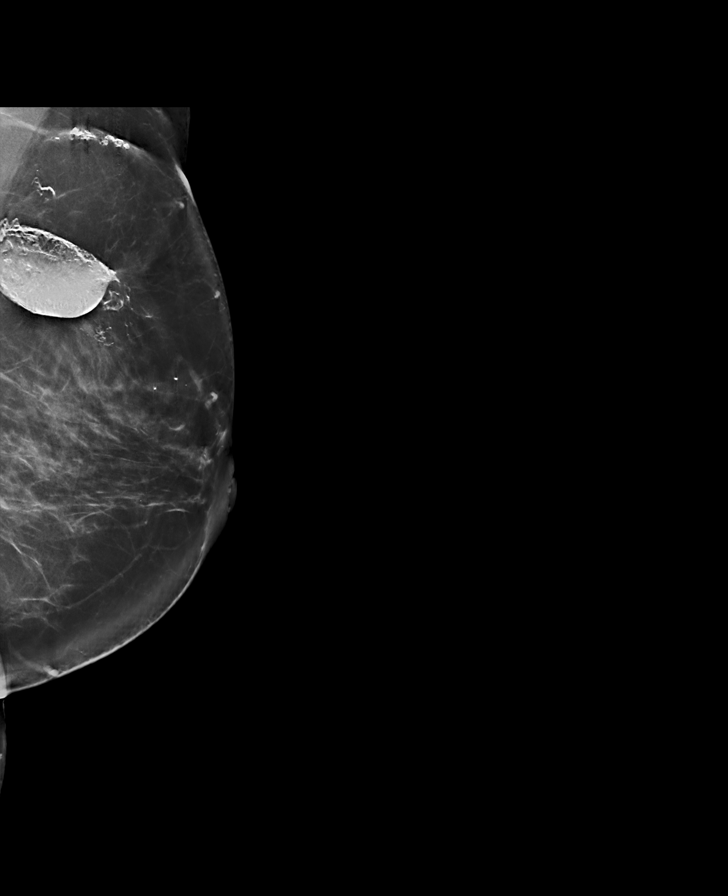

[L CC synth-2D (2 of 2)]
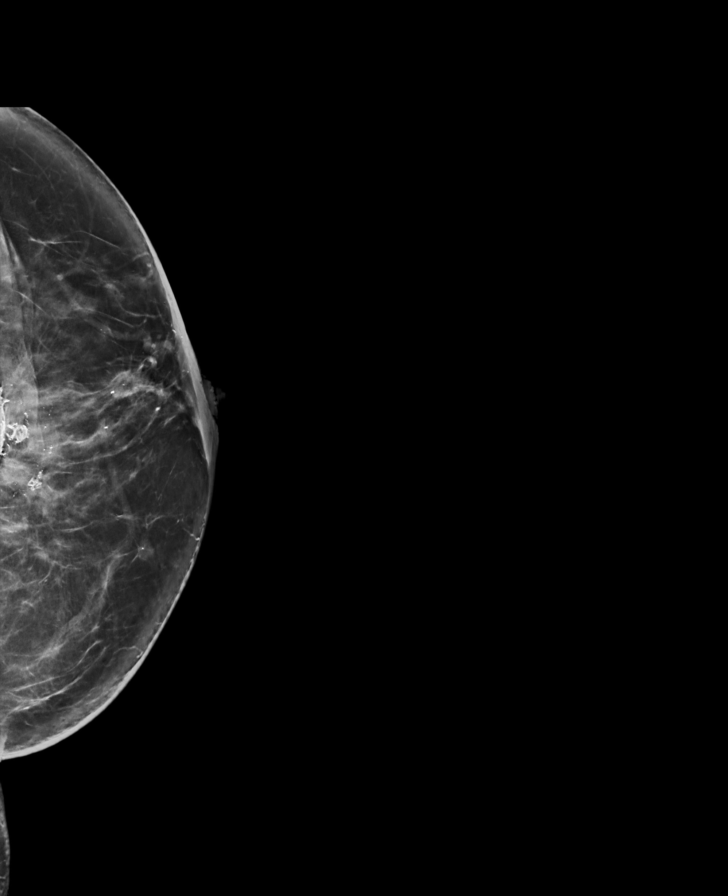

[R CC]
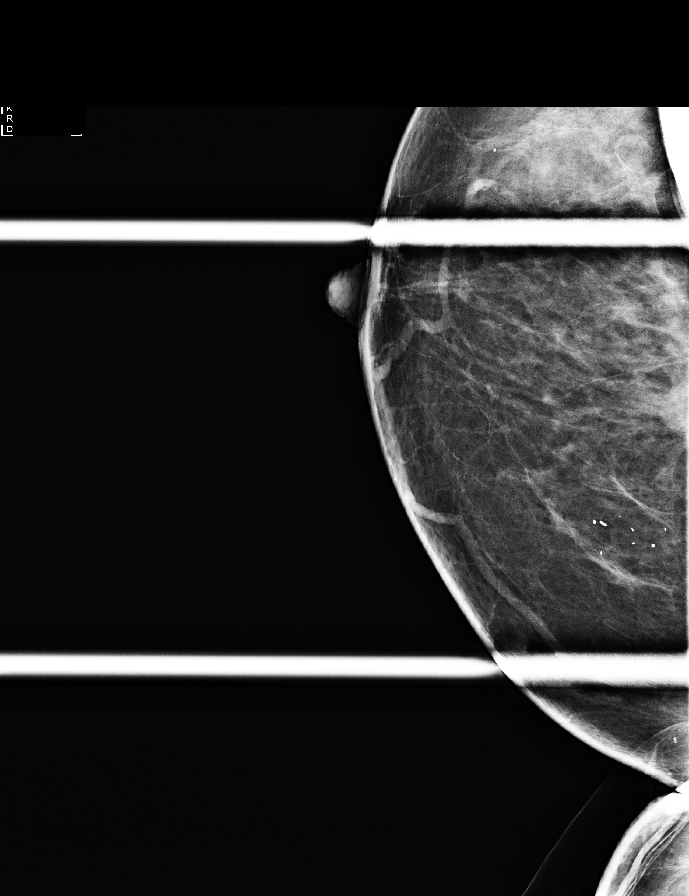

[L MLO]
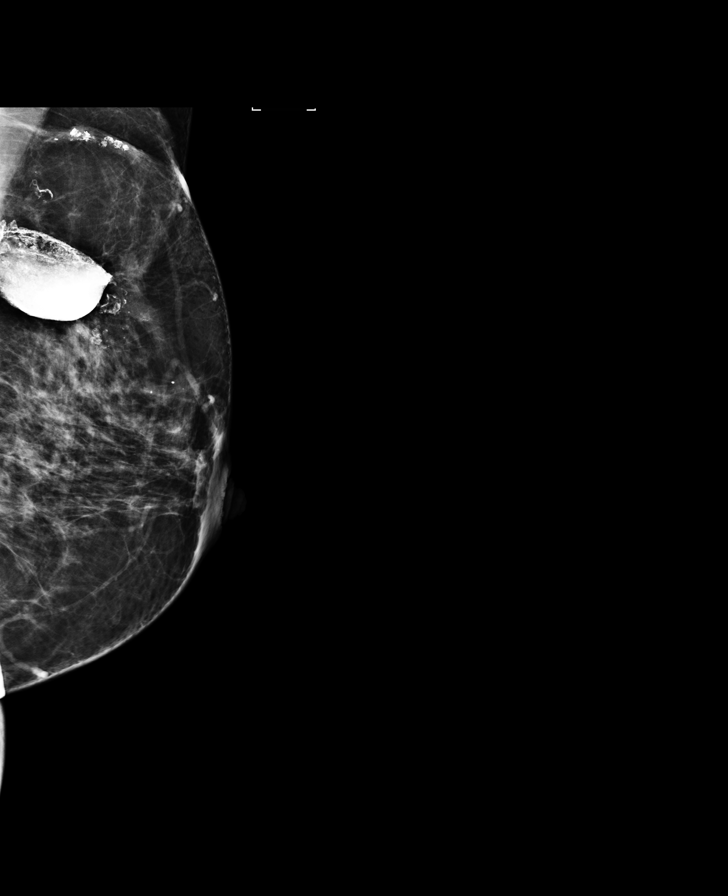

[8 of 40 positions shown; findings below may reference images not displayed]

ACR Breast Density Category c: The breast tissue is heterogeneously
dense, which may obscure small masses.
FINDINGS: Within the upper, inner right breast there is a 13 mm oval mass,
posterior depth. No additional abnormality is noted.

Postlumpectomy and posttreatment changes of the posterior, superior
left breast are again seen. A 5 mm oval mass is identified within
the upper, inner left breast anterior depth. In addition,
architectural distortion, separate from the lumpectomy site, is
noted within the upper, slightly lateral left breast, anterior
depth.

Mammographic images were processed with CAD.

On physical exam, there is a palpable nodule within right breast at
2 o'clock, 2 cm from the nipple. Within the left breast, scar from
prior lumpectomy is seen within the superior left breast. In
addition, a periareolar scar is noted. The patient states that
surgery was performed by Dr. Mariana in his office in this area.

Targeted ultrasound of the right breast is performed, showing an
oval, hypoechoic mass with slightly irregular margins at 2 o'clock,
2 cm from the nipple measuring 11 x 8 x 11 mm, corresponding to the
mammographic finding and palpable abnormality.

Targeted ultrasound of the left breast demonstrates an oval mixed
echogenicity mass at [DATE], 4 cm from the nipple measuring 7 x 4 x 4
mm which is thought to correspond to the mass seen mammographically
within the superficial upper, inner left breast. An additional mixed
echogenicity mass is seen at 12 o'clock, 2 cm from the nipple
measuring 11 x 11 x 10 mm, which is thought to correspond to the
area of distortion within the upper, slightly outer left breast.
Both of these areas are favored to represent areas of fat necrosis/
scarring.
IMPRESSION: 1. Indeterminate right breast mass.
2. Probable area of scarring/ fat necrosis within the left breast at
12 o'clock, 2 cm from the nipple. Additional probable area of fat
necrosis at [DATE], 4 cm from the nipple.

RECOMMENDATION:
1. Ultrasound-guided biopsy of the right breast mass at 2 o'clock, 2
cm from the nipple.
2. Ultrasound-guided biopsy of the probable area of scarring/ fat
necrosis within the left breast at 12 o'clock, 2 cm from the nipple.
Post biopsy mammogram should be performed to confirm that this
represents a sonographic correlate to the area of distortion seen
mammographically. If biopsy results are benign, a left diagnostic
mammogram and possible ultrasound is recommended in 6 months for
follow-up of the left breast mass at [DATE], 4 cm from the nipple.

I have discussed the findings and recommendations with the patient.
Results were also provided in writing at the conclusion of the
visit. If applicable, a reminder letter will be sent to the patient
regarding the next appointment.

BI-RADS CATEGORY  4: Suspicious.

## 2017-12-25 ENCOUNTER — Encounter: Payer: Self-pay | Admitting: General Surgery

## 2017-12-25 ENCOUNTER — Ambulatory Visit: Payer: Self-pay

## 2017-12-25 ENCOUNTER — Ambulatory Visit: Payer: Medicare HMO | Admitting: General Surgery

## 2017-12-25 VITALS — BP 140/80 | HR 90 | Resp 12 | Ht 67.0 in | Wt 214.0 lb

## 2017-12-25 DIAGNOSIS — R8290 Unspecified abnormal findings in urine: Secondary | ICD-10-CM | POA: Diagnosis not present

## 2017-12-25 DIAGNOSIS — N641 Fat necrosis of breast: Secondary | ICD-10-CM | POA: Diagnosis not present

## 2017-12-25 DIAGNOSIS — C50211 Malignant neoplasm of upper-inner quadrant of right female breast: Secondary | ICD-10-CM | POA: Diagnosis not present

## 2017-12-25 DIAGNOSIS — Z170 Estrogen receptor positive status [ER+]: Secondary | ICD-10-CM | POA: Diagnosis not present

## 2017-12-25 DIAGNOSIS — N6320 Unspecified lump in the left breast, unspecified quadrant: Secondary | ICD-10-CM

## 2017-12-25 NOTE — Progress Notes (Signed)
Patient ID: Leah Schaefer, female   DOB: February 02, 1941, 77 y.o.   MRN: [id number]  Chief Complaint  Patient presents with  . Follow-up    HPI Leah Schaefer is a 77 y.o. female who presents for a breast evaluation. The most recent mammogram was done on 12/18/2017. Denisty test done.  Patient does perform regular self breast checks and gets regular mammograms done.    HPI  Past Medical History:  Diagnosis Date  . Breast cancer ([place])   . Breast cancer of upper-inner quadrant of right female breast ([place]) 09/21/2015   T1bN0 " 22m; ER100%, PR 90%, HER-2/neu not overexpressed.  . Cystitis   . Diabetes mellitus without complication (HCC)    NO MEDS-DIET CONTROLLED  . GERD (gastroesophageal reflux disease)   . Hernia 2011  . Hypertension   . Malignant ne[place] of [place] quadrant of [place] breast ([place] 08/17/2011   Left, T2 (2.3 cm) N0, triple negative. Chemotherapy/post wide excision whole breast radiation.  . 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 Personal history of chemotherapy 2013  . Personal history of malignant neoplasm of breast   . Personal history of radiation therapy 2017    Rt  . Vertigo     Past Surgical History:  Procedure Laterality Date  . BREAST BIOPSY Left 07/18/2011   +  . BREAST BIOPSY Left 09/21/2015   neg  . BREAST BIOPSY Left 04/05/2016   neg  . BREAST EXCISIONAL BIOPSY Right 2017   +  . BREAST LUMPECTOMY Right 09/30/2015  . BREAST LUMPECTOMY Left 08/17/2011  . BREAST LUMPECTOMY WITH SENTINEL LYMPH NODE BIOPSY Right 09/30/2015   Procedure: BREAST LUMPECTOMY WITH SENTINEL LYMPH NODE BX;  Surgeon: JRobert Bellow MD;  Location: [facility] ORS;  Service: General;  Laterality: Right;  . BREAST SURGERY Left 2012   wide excision  . BREAST SURGERY Left November 2013   Core biopsy of the upper-outer quadrant showed fat necrosis.  . CHOLECYSTECTOMY  2007  . COLONOSCOPY  2011   Dr OCandace Cruise . COLONOSCOPY WITH PROPOFOL N/A  02/25/2015   Procedure: COLONOSCOPY WITH PROPOFOL;  Surgeon: PHulen Luster MD;  [facility];  Service: Gastroenterology;  Laterality: N/A;  . DILATION AND CURETTAGE OF UTERUS  2004  . HERNIA REPAIR Right 081/08/7508  Umbilical/ventral hernia repaired with 6.4 cm Proceed ventral patch  . PORT-A-CATH REMOVAL    . PORTACATH PLACEMENT  2013  . 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  .Marland KitchenUPPER GI ENDOSCOPY  2011    Family History  Problem Relation Age of Onset  . Lymphoma Father   . Ovarian cancer Sister   . Colon cancer Brother   . Lymphoma Brother   . Cancer Other        stomach  . Cancer Other        stomach  . Breast cancer Neg Hx     Social History Social History   Tobacco Use  . Smoking status: Never Smoker  . Smokeless tobacco: Never Used  Substance Use Topics  . Alcohol use: No  . Drug use: No    No Known Allergies  Current Outpatient Medications  Medication Sig Dispense Refill  . ACCU-CHEK FASTCLIX LANCETS MISC Check sugar once daily DX E11.9 102 each 3  . acetaminophen (TYLENOL) 500 MG tablet Take 500 mg by mouth every 6 (six)  hours as needed for mild pain or headache.     Marland Kitchen acyclovir (ZOVIRAX) 200 MG capsule TAKE 1 CAPSULE EVERY DAY 90 capsule 3  . Alcohol Swabs (B-D SINGLE USE SWABS REGULAR) PADS Check sugar once daily DX E11.9 100 each 3  . aspirin EC 81 MG tablet Take 81 mg by mouth daily.    Marland Kitchen atorvastatin (LIPITOR) 10 MG tablet TAKE 1 TABLET EVERY EVENING 90 tablet 3  . bimatoprost (LUMIGAN) 0.01 % SOLN Place 1 drop into both eyes at bedtime.     . Blood Glucose Calibration (ACCU-CHEK SMARTVIEW CONTROL) LIQD     . Calcium Carb-Cholecalciferol (CALCIUM 600+D3) 600-800 MG-UNIT TABS Take 1 tablet by mouth 2 (two) times daily.    . cholecalciferol (VITAMIN D) 1000 units tablet Take 1,000 Units by mouth daily.    . cyanocobalamin 500  MCG tablet Take 500 mcg by mouth daily.    Marland Kitchen escitalopram (LEXAPRO) 10 MG tablet Take 1 tablet (10 mg total) by mouth daily. 30 tablet 12  . escitalopram (LEXAPRO) 20 MG tablet TAKE 1 TABLET EVERY DAY 90 tablet 3  . gabapentin (NEURONTIN) 300 MG capsule Take 2 capsules (600 mg total) by mouth at bedtime. 180 capsule 3  . glucose blood (ACCU-CHEK SMARTVIEW) test strip Check sugar once daily DX E11.9 100 each 3  . latanoprost (XALATAN) 0.005 % ophthalmic solution Place 1 drop into both eyes at bedtime.     Marland Kitchen letrozole (FEMARA) 2.5 MG tablet Take 1 tablet (2.5 mg total) by mouth daily. 90 tablet 3  . meclizine (ANTIVERT) 25 MG tablet Take 0.5 tablets (12.5 mg total) by mouth 3 (three) times daily as needed for dizziness. 15 tablet 0  . meloxicam (MOBIC) 15 MG tablet TAKE 1 TABLET EVERY DAY 90 tablet 3  . metFORMIN (GLUCOPHAGE) 500 MG tablet Take 1 tablet (500 mg total) by mouth 2 (two) times daily with a meal. 180 tablet 3  . metoprolol tartrate (LOPRESSOR) 25 MG tablet TAKE 1/2 TABLET TWICE DAILY 90 tablet 3  . Multiple Vitamin (MULTIVITAMIN WITH MINERALS) TABS tablet Take 1 tablet by mouth daily.    Marland Kitchen omega-3 acid ethyl esters (LOVAZA) 1 g capsule Take 3 g by mouth daily.     . pantoprazole (PROTONIX) 40 MG tablet Take 1 tablet (40 mg total) by mouth 2 (two) times daily. 180 tablet 3  . pyridoxine (B-6) 100 MG tablet Take 100 mg by mouth daily.    . vitamin C (ASCORBIC ACID) 500 MG tablet Take 1,000 mg by mouth daily.    . vitamin E 400 UNIT capsule Take 400 Units by mouth daily.     No current facility-administered medications for this visit.     Review of Systems Review of Systems  Constitutional: Negative.   Respiratory: Negative.   Cardiovascular: Negative.     Blood pressure 140/80, pulse 90, resp. rate 12, height 5' 7" (1.702 m), weight 214 lb (97.1 kg).  Physical Exam Physical Exam  Constitutional: She is oriented to person, place, and time. She appears well-developed and  well-nourished.  Eyes: Conjunctivae are normal. No scleral icterus.  Neck: Neck supple.  Cardiovascular: Normal rate, regular rhythm and normal heart sounds.  Pulmonary/Chest: Effort normal and breath sounds normal. Right breast exhibits no inverted nipple, no mass, no nipple discharge, no skin change and no tenderness. Left breast exhibits no inverted nipple, no nipple discharge, no skin change and no tenderness.    Lymphadenopathy:    She has no cervical adenopathy.  She has no axillary adenopathy.  Neurological: She is alert and oriented to person, place, and time.  Skin: Skin is warm and dry.    Data Reviewed Mammograms and utlrasound of Dec 18, 2017 reviewed. Normal bone density.   Ultrasound of the left breast showed multiple areas in the subcutaneous fat with with associated dermal thickening, hyperechoic pattern adjacent and variable posterior acoustic shadowing.  The areas at the 530 o'clock and 930 o'clock recently of interest on radiologic imaging were specifically evaluated.  With a low index of suspicion it was elected to complete FNA sampling to confirm the clinical impression of fat necrosis prior to proceeding to core biopsy.  This was acceptable to the patient.    The lesion at the 530 o'clock position 2 cm from the nipple showed a 0.5 x 0.5 x 0.6 cm dense hypoechoic mass with posterior acoustic shadowing, hyperechoic tissue adjacent to the lesion and no vascular flow.  Multiple passes with a 22-gauge needle were completed prior to preparation of 2 slides for cytologic review.  The consistency of the fluid was that of saponified fat. BI-RADS-3.  Lesion at the 930 o'clock position 4 cm from the nipple yielded a irregular 0.4 x 1.0 x 1.12 hypoechoic mass with surrounding hyperechoic tissue.  Minimal posterior acoustic shadowing.  The patient was amenable to FNA sampling and again this was completed using 1 cc of 1% plain Xylocaine.  Slides x2 were prepared.  Consistency of the  fluid extracted was out of saponified fat.  BI-RADS-3.  At the 3 o'clock position 2 cm from the nipple a similarly appearing 0.4 x 0.77 x 0.83 cm hypoechoic mass with dense acoustic shadowing is noted again with associated hyperechoic adjacent tissue.  BI-RADS-3.  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    Plan  The patient will be contacted when cytology reports are available.  She is aware if these are inconclusive we will recommend core biopsy. The patient has been asked to return to the office in one year with a bilateral breast diagnostic mammogram.  HPI, Physical Exam, Assessment and Plan have been scribed under the direction and in the presence of Hervey Ard, MD.  Gaspar Cola, CMA  I have completed the exam and reviewed the above documentation for accuracy and completeness.  I agree with the above.  Haematologist has been used and any errors in dictation or transcription are unintentional.  Hervey Ard, M.D., F.A.C.S.  Forest Gleason Yazen Rosko 12/25/2017, 8:33 PM

## 2017-12-25 NOTE — Patient Instructions (Signed)
  The patient has been asked to return to the office in one year with a bilateral breast diagnostic mammogram.

## 2017-12-28 ENCOUNTER — Telehealth: Payer: Self-pay | Admitting: General Surgery

## 2017-12-28 NOTE — Telephone Encounter (Signed)
Patient called and would like to know her FNA lt BR bx results done 12-25-17. Please call her at [phone].

## 2018-01-09 ENCOUNTER — Ambulatory Visit (INDEPENDENT_AMBULATORY_CARE_PROVIDER_SITE_OTHER): Payer: Medicare HMO

## 2018-01-09 ENCOUNTER — Ambulatory Visit (INDEPENDENT_AMBULATORY_CARE_PROVIDER_SITE_OTHER): Payer: Medicare HMO | Admitting: Family Medicine

## 2018-01-09 ENCOUNTER — Encounter: Payer: Self-pay | Admitting: Family Medicine

## 2018-01-09 VITALS — BP 134/82 | HR 78 | Temp 98.9°F | Ht 67.0 in | Wt 213.8 lb

## 2018-01-09 DIAGNOSIS — Z0000 Encounter for general adult medical examination without abnormal findings: Secondary | ICD-10-CM | POA: Diagnosis not present

## 2018-01-09 DIAGNOSIS — E7800 Pure hypercholesterolemia, unspecified: Secondary | ICD-10-CM | POA: Diagnosis not present

## 2018-01-09 DIAGNOSIS — E119 Type 2 diabetes mellitus without complications: Secondary | ICD-10-CM | POA: Diagnosis not present

## 2018-01-09 DIAGNOSIS — I10 Essential (primary) hypertension: Secondary | ICD-10-CM

## 2018-01-09 LAB — HM DIABETES EYE EXAM

## 2018-01-09 NOTE — Patient Instructions (Addendum)
Leah Schaefer , Thank you for taking time to come for your Medicare Wellness Visit. I appreciate your ongoing commitment to your health goals. Please review the following plan we discussed and let me know if I can assist you in the future.   Screening recommendations/referrals: Colonoscopy: Up to date Mammogram: Up to date Bone Density: Up to date Recommended yearly ophthalmology/optometry visit for glaucoma screening and checkup Recommended yearly dental visit for hygiene and checkup  Vaccinations: Influenza vaccine: Up to date Pneumococcal vaccine: Up to date Tdap vaccine: Up to date Shingles vaccine: Pt declines today.     Advanced directives: Advance directive discussed with you today. Even though you declined this today please call our office should you change your mind and we can give you the proper paperwork for you to fill out.  Conditions/risks identified: Fall risk prevention; Obesity- recommend to continue current diet regimen of eating chicken that is broil or grilled only and avoiding other fatty meats.   Next appointment: 10:20 AM today with Dr Rosanna Randy.    Preventive Care 34 Years and Older, Female Preventive care refers to lifestyle choices and visits with your health care provider that can promote health and wellness. What does preventive care include?  A yearly physical exam. This is also called an annual well check.  Dental exams once or twice a year.  Routine eye exams. Ask your health care provider how often you should have your eyes checked.  Personal lifestyle choices, including:  Daily care of your teeth and gums.  Regular physical activity.  Eating a healthy diet.  Avoiding tobacco and drug use.  Limiting alcohol use.  Practicing safe sex.  Taking low-dose aspirin every day.  Taking vitamin and mineral supplements as recommended by your health care provider. What happens during an annual well check? The services and screenings done by your  health care provider during your annual well check will depend on your age, overall health, lifestyle risk factors, and family history of disease. Counseling  Your health care provider may ask you questions about your:  Alcohol use.  Tobacco use.  Drug use.  Emotional well-being.  Home and relationship well-being.  Sexual activity.  Eating habits.  History of falls.  Memory and ability to understand (cognition).  Work and work Statistician.  Reproductive health. Screening  You may have the following tests or measurements:  Height, weight, and BMI.  Blood pressure.  Lipid and cholesterol levels. These may be checked every 5 years, or more frequently if you are over 53 years old.  Skin check.  Lung cancer screening. You may have this screening every year starting at age 56 if you have a 30-pack-year history of smoking and currently smoke or have quit within the past 15 years.  Fecal occult blood test (FOBT) of the stool. You may have this test every year starting at age 45.  Flexible sigmoidoscopy or colonoscopy. You may have a sigmoidoscopy every 5 years or a colonoscopy every 10 years starting at age 59.  Hepatitis C blood test.  Hepatitis B blood test.  Sexually transmitted disease (STD) testing.  Diabetes screening. This is done by checking your blood sugar (glucose) after you have not eaten for a while (fasting). You may have this done every 1-3 years.  Bone density scan. This is done to screen for osteoporosis. You may have this done starting at age 81.  Mammogram. This may be done every 1-2 years. Talk to your health care provider about how often you should  have regular mammograms. Talk with your health care provider about your test results, treatment options, and if necessary, the need for more tests. Vaccines  Your health care provider may recommend certain vaccines, such as:  Influenza vaccine. This is recommended every year.  Tetanus, diphtheria, and  acellular pertussis (Tdap, Td) vaccine. You may need a Td booster every 10 years.  Zoster vaccine. You may need this after age 77.  Pneumococcal 13-valent conjugate (PCV13) vaccine. One dose is recommended after age 25.  Pneumococcal polysaccharide (PPSV23) vaccine. One dose is recommended after age 42. Talk to your health care provider about which screenings and vaccines you need and how often you need them. This information is not intended to replace advice given to you by your health care provider. Make sure you discuss any questions you have with your health care provider. Document Released: 08/20/2015 Document Revised: 04/12/2016 Document Reviewed: 05/25/2015 Elsevier Interactive Patient Education  2017 [employer] Prevention in the Home Falls can cause injuries. They can happen to people of all ages. There are many things you can do to make your home safe and to help prevent falls. What can I do on the outside of my home?  Regularly fix the edges of walkways and driveways and fix any cracks.  Remove anything that might make you trip as you walk through a door, such as a raised step or threshold.  Trim any bushes or trees on the path to your home.  Use bright outdoor lighting.  Clear any walking paths of anything that might make someone trip, such as rocks or tools.  Regularly check to see if handrails are loose or broken. Make sure that both sides of any steps have handrails.  Any raised decks and porches should have guardrails on the edges.  Have any leaves, snow, or ice cleared regularly.  Use sand or salt on walking paths during winter.  Clean up any spills in your garage right away. This includes oil or grease spills. What can I do in the bathroom?  Use night lights.  Install grab bars by the toilet and in the tub and shower. Do not use towel bars as grab bars.  Use non-skid mats or decals in the tub or shower.  If you need to sit down in the shower, use  a plastic, non-slip stool.  Keep the floor dry. Clean up any water that spills on the floor as soon as it happens.  Remove soap buildup in the tub or shower regularly.  Attach bath mats securely with double-sided non-slip rug tape.  Do not have throw rugs and other things on the floor that can make you trip. What can I do in the bedroom?  Use night lights.  Make sure that you have a light by your bed that is easy to reach.  Do not use any sheets or blankets that are too big for your bed. They should not hang down onto the floor.  Have a firm chair that has side arms. You can use this for support while you get dressed.  Do not have throw rugs and other things on the floor that can make you trip. What can I do in the kitchen?  Clean up any spills right away.  Avoid walking on wet floors.  Keep items that you use a lot in easy-to-reach places.  If you need to reach something above you, use a strong step stool that has a grab bar.  Keep electrical cords out of  the way.  Do not use floor polish or wax that makes floors slippery. If you must use wax, use non-skid floor wax.  Do not have throw rugs and other things on the floor that can make you trip. What can I do with my stairs?  Do not leave any items on the stairs.  Make sure that there are handrails on both sides of the stairs and use them. Fix handrails that are broken or loose. Make sure that handrails are as long as the stairways.  Check any carpeting to make sure that it is firmly attached to the stairs. Fix any carpet that is loose or worn.  Avoid having throw rugs at the top or bottom of the stairs. If you do have throw rugs, attach them to the floor with carpet tape.  Make sure that you have a light switch at the top of the stairs and the bottom of the stairs. If you do not have them, ask someone to add them for you. What else can I do to help prevent falls?  Wear shoes that:  Do not have high heels.  Have  rubber bottoms.  Are comfortable and fit you well.  Are closed at the toe. Do not wear sandals.  If you use a stepladder:  Make sure that it is fully opened. Do not climb a closed stepladder.  Make sure that both sides of the stepladder are locked into place.  Ask someone to hold it for you, if possible.  Clearly mark and make sure that you can see:  Any grab bars or handrails.  First and last steps.  Where the edge of each step is.  Use tools that help you move around (mobility aids) if they are needed. These include:  Canes.  Walkers.  Scooters.  Crutches.  Turn on the lights when you go into a dark area. Replace any light bulbs as soon as they burn out.  Set up your furniture so you have a clear path. Avoid moving your furniture around.  If any of your floors are uneven, fix them.  If there are any pets around you, be aware of where they are.  Review your medicines with your doctor. Some medicines can make you feel dizzy. This can increase your chance of falling. Ask your doctor what other things that you can do to help prevent falls. This information is not intended to replace advice given to you by your health care provider. Make sure you discuss any questions you have with your health care provider. Document Released: 05/20/2009 Document Revised: 12/30/2015 Document Reviewed: 08/28/2014 Elsevier Interactive Patient Education  2017 [employer].

## 2018-01-09 NOTE — Progress Notes (Signed)
Patient: Leah Schaefer, Female    DOB: [birth year], 77 y.o.   MRN: [id number] Visit Date: 01/09/2018  Today's Provider: [name], MD   Chief Complaint  Patient presents with  . Annual Exam   Subjective:    Annual physical exam Leah Schaefer is a 77 y.o. female who presents today for health maintenance and complete physical. She feels well. She reports exercising 3 times a week. She reports she is sleeping well.  ----------------------------------------------------------------- Colonoscopy- 02/25/15 Dr Candace Cruise Normal Repeat 5 years Mammogram- 12/18/17 left breast mass, u/s. Repeat diagnostic mammogram 1 year with Dr. Terri Piedra BMD- 12/18/17 normal  Review of Systems  Constitutional: Negative.   HENT: Positive for tinnitus.   Eyes: Negative.   Respiratory: Negative.   Cardiovascular: Negative.   Gastrointestinal: Negative.   Endocrine: Negative.   Genitourinary: Negative.   Musculoskeletal: Positive for arthralgias, back pain and neck stiffness.  Skin: Negative.   Allergic/Immunologic: Negative.   Neurological: Negative.   Hematological: Negative.   Psychiatric/Behavioral: The patient is nervous/anxious.     Social History      She  reports that she has never smoked. She has never used smokeless tobacco. She reports that she does not drink alcohol or use drugs.       Social History   Socioeconomic History  . Marital status: Married    Spouse name: Not on file  . Number of children: 2  . Years of education: H/S  . Highest education level: 12th grade  Occupational History  . Occupation: Retired  Scientific laboratory technician  . Financial resource strain: Not hard at all  . Food insecurity:    Worry: Never true    Inability: Never true  . Transportation needs:    Medical: No    Non-medical: No  Tobacco Use  . Smoking status: Never Smoker  . Smokeless tobacco: Never Used  Substance and Sexual Activity  . Alcohol use: No  . Drug use: No  . Sexual activity: Not on  file  Lifestyle  . Physical activity:    Days per week: Not on file    Minutes per session: Not on file  . Stress: Only a little  Relationships  . Social connections:    Talks on phone: Not on file    Gets together: Not on file    Attends religious service: Not on file    Active member of club or organization: Not on file    Attends meetings of clubs or organizations: Not on file    Relationship status: Not on file  Other Topics Concern  . Not on file  Social History Narrative  . Not on file    Past Medical History:  Diagnosis Date  . Breast cancer ([place])   . 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  . Cystitis   . Diabetes mellitus without complication ([facility])    NO MEDS-DIET CONTROLLED  . GERD (gastroesophageal reflux disease)   . Hernia 2011  . Hyperlipidemia   . Hypertension   . Malignant neoplasm of upper-inner quadrant of female breast ([place] 08/17/2011   Left, T2 (2.3 cm) N0, triple negative. Chemotherapy/post wide excision whole breast radiation.  . 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 Personal history of chemotherapy 2013  . Personal history of malignant neoplasm of breast   . Personal history of radiation  therapy 2017    Rt  . Vertigo      Patient Active Problem List   Diagnosis Date Noted  . 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 Umbilical pain 46/96/2952  . Breast mass, left 04/07/2016  . Controlled type 2 diabetes mellitus without complication ([place]) 84/13/2440  . Allergic rhinitis 03/30/2015  . Anxiety 03/30/2015  . Carotid arterial disease ([place]) 03/30/2015  . Diabetes mellitus, type 2 ([place]) 03/30/2015  . Essential (primary) hypertension 03/30/2015  . Hypercholesteremia 03/30/2015  . Left leg pain 03/30/2015  . LBP (low back pain) 03/30/2015  . Neuropathic pain 03/30/2015  . Burning or prickling sensation 03/30/2015  .  Neuralgia neuritis, sciatic nerve 03/30/2015  . Skin cyst 11/24/2014  . Carcinoma of upper-outer quadrant of right breast in female, estrogen receptor positive ([place]) 08/17/2011  . Allergic reaction 11/29/2009  . Adaptation reaction 04/30/2009  . Acid reflux 04/30/2009  . Stricture and stenosis of cervix 01/28/2009    Past Surgical History:  Procedure Laterality Date  . BREAST BIOPSY Left 07/18/2011   +  . BREAST BIOPSY Left 09/21/2015   neg  . BREAST BIOPSY Left 04/05/2016   neg  . BREAST EXCISIONAL BIOPSY Right 2017   +  . BREAST LUMPECTOMY Right 09/30/2015  . BREAST LUMPECTOMY Left 08/17/2011  . BREAST LUMPECTOMY WITH SENTINEL LYMPH NODE BIOPSY Right 09/30/2015   Procedure: BREAST LUMPECTOMY WITH SENTINEL LYMPH NODE BX;  Surgeon: Robert Bellow, MD;  Location: [facility] ORS;  Service: General;  Laterality: Right;  . BREAST SURGERY Left 2012   wide excision  . BREAST SURGERY Left November 2013   Core biopsy of the upper-outer quadrant showed fat necrosis.  . CHOLECYSTECTOMY  2007  . COLONOSCOPY  2011   Dr Candace Cruise  . COLONOSCOPY WITH PROPOFOL N/A 02/25/2015   Procedure: COLONOSCOPY WITH PROPOFOL;  Surgeon: Hulen Luster, MD;  Location[facility] ENDOSCOPY;  Service: Gastroenterology;  Laterality: N/A;  . DILATION AND CURETTAGE OF UTERUS  2004  . HERNIA REPAIR Right 06/03/2535   Umbilical/ventral hernia repaired with 6.4 cm Proceed ventral patch  . PORT-A-CATH REMOVAL    . PORTACATH PLACEMENT  2013  . 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  [facility] Kitchen UPPER GI ENDOSCOPY  2011    Family History        Family Status  Relation Name Status  . Father  Deceased  . Mother  Deceased at age 57       Died from old age.   . Sister  (Not Specified)  . Brother  (Not Specified)  . Brother  (Not Specified)  . Other nephew Deceased  . Other nephew Deceased  . Neg Hx  (Not Specified)         Her family history includes Cancer in her other and other; Colon cancer in her brother; Lymphoma in her brother and father; Ovarian cancer in her sister. There is no history of Breast cancer.      No Known Allergies   Current Outpatient Medications:  .  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  acetaminophen (TYLENOL) 500 MG tablet, Take 500 mg by mouth every 6 (six) hours as needed for mild pain or headache. , Disp: , Rfl:  .  acyclovir (ZOVIRAX) 200 MG capsule, TAKE 1 CAPSULE EVERY DAY, Disp: 90 capsule, Rfl: 3 .  Alcohol Swabs (B-D  SINGLE USE SWABS REGULAR) PADS, Check sugar once daily DX E11.9, Disp: 100 each, Rfl: 3 .  aspirin EC 81 MG tablet, Take 81 mg by mouth daily., Disp: , Rfl:  .  atorvastatin (LIPITOR) 10 MG tablet, TAKE 1 TABLET EVERY EVENING, Disp: 90 tablet, Rfl: 3 .  bimatoprost (LUMIGAN) 0.01 % SOLN, Place 1 drop into both eyes at bedtime. , Disp: , Rfl:  .  Blood Glucose Calibration (ACCU-CHEK SMARTVIEW CONTROL) LIQD, , Disp: , Rfl:  .  Calcium Carb-Cholecalciferol (CALCIUM 600+D3) 600-800 MG-UNIT TABS, Take 1 tablet by mouth 2 (two) times daily., Disp: , Rfl:  .  cholecalciferol (VITAMIN D) 1000 units tablet, Take 1,000 Units by mouth daily., Disp: , Rfl:  .  cyanocobalamin 500 MCG tablet, Take 500 mcg by mouth daily., Disp: , Rfl:  .  escitalopram (LEXAPRO) 10 MG tablet, Take 1 tablet (10 mg total) by mouth daily. (Patient taking differently: Take 10 mg by mouth daily. ), Disp: 30 tablet, Rfl: 12 .  escitalopram (LEXAPRO) 20 MG tablet, TAKE 1 TABLET EVERY DAY (Patient taking differently: TAKE 1 TABLET EVERY DAY (alternates between this and 2 10 mg tablets (total 24m) daily)), Disp: 90 tablet, Rfl: 3 .  gabapentin (NEURONTIN) 300 MG capsule, Take 2 capsules (600 mg total) by mouth at bedtime., Disp: 180 capsule, Rfl: 3 .  glucose blood (ACCU-CHEK SMARTVIEW) test strip, Check sugar once daily DX E11.9, Disp: 100 each, Rfl:  3 .  latanoprost (XALATAN) 0.005 % ophthalmic solution, Place 1 drop into both eyes at bedtime. , Disp: , Rfl:  .  letrozole (FEMARA) 2.5 MG tablet, Take 1 tablet (2.5 mg total) by mouth daily., Disp: 90 tablet, Rfl: 3 .  meclizine (ANTIVERT) 25 MG tablet, Take 0.5 tablets (12.5 mg total) by mouth 3 (three) times daily as needed for dizziness., Disp: 15 tablet, Rfl: 0 .  meloxicam (MOBIC) 15 MG tablet, TAKE 1 TABLET EVERY DAY, Disp: 90 tablet, Rfl: 3 .  metFORMIN (GLUCOPHAGE) 500 MG tablet, Take 1 tablet (500 mg total) by mouth 2 (two) times daily with a meal., Disp: 180 tablet, Rfl: 3 .  metoprolol tartrate (LOPRESSOR) 25 MG tablet, TAKE 1/2 TABLET TWICE DAILY, Disp: 90 tablet, Rfl: 3 .  Multiple Vitamin (MULTIVITAMIN WITH MINERALS) TABS tablet, Take 1 tablet by mouth daily., Disp: , Rfl:  .  omega-3 acid ethyl esters (LOVAZA) 1 g capsule, Take 3 g by mouth daily. , Disp: , Rfl:  .  pantoprazole (PROTONIX) 40 MG tablet, Take 1 tablet (40 mg total) by mouth 2 (two) times daily., Disp: 180 tablet, Rfl: 3 .  pyridoxine (B-6) 100 MG tablet, Take 100 mg by mouth daily., Disp: , Rfl:  .  vitamin C (ASCORBIC ACID) 500 MG tablet, Take 1,000 mg by mouth daily., Disp: , Rfl:  .  vitamin E 400 UNIT capsule, Take 400 Units by mouth daily., Disp: , Rfl:    Patient Care Team: GJerrol Banana, MD as PCP - General (Family Medicine) BBary Castilla JForest Gleason MD (General Surgery) BCammie Sickle MD as Consulting Physician (Internal Medicine) BLorelee Cover, MD as Consulting Physician (Ophthalmology) CNoreene Filbert MD as Referring Physician (Radiation Oncology)      Objective:   Vitals: BP 134/82   Pulse 78   Temp 98.9 F (37.2 C) (Oral)   Ht _0  (1.702 m)   Wt 213 lb 12.8 oz (97 kg)   BMI 33.49 kg/m    Vitals:   01/09/18 1022  BP: 134/82  Pulse: 78  Temp: 98.9 F (37.2 C)  TempSrc: Oral  Weight: 213 lb 12.8 oz (97 kg)  Height: _0  (1.702 m)     Physical Exam    Constitutional: She is oriented to person, place, and time. She appears well-developed and well-nourished.  HENT:  Head: Normocephalic and atraumatic.  Right Ear: External ear normal.  Left Ear: External ear normal.  Nose: Nose normal.  Mouth/Throat: Oropharynx is clear and moist.  Eyes: Conjunctivae are normal. No scleral icterus.  Neck: No thyromegaly present.  Cardiovascular: Normal rate, regular rhythm and normal heart sounds.  Pulmonary/Chest: Effort normal and breath sounds normal.  Abdominal: Soft.  Musculoskeletal: She exhibits no edema.  Neurological: She is alert and oriented to person, place, and time.  Skin: Skin is warm and dry.  Psychiatric: She has a normal mood and affect. Her behavior is normal. Judgment and thought content normal.     Depression Screen PHQ 2/9 Scores 01/09/2018 02/20/2017 12/06/2016 12/06/2016  PHQ - 2 Score 0 2 0 0  PHQ- 9 Score - 6 0 -      Assessment & Plan:     Routine Health Maintenance and Physical Exam  Exercise Activities and Dietary recommendations Goals    . DIET - INCREASE LEAN PROTEINS     Recommend to continue current diet regimen of eating chicken that is broil or grilled only and avoiding other fatty meats.        Immunization History  Administered Date(s) Administered  . Influenza, High Dose Seasonal PF 05/13/2014, 05/29/2015, 04/05/2016, 04/19/2017  . Influenza,inj,Quad PF,6+ Mos 05/23/2013  . Pneumococcal Conjugate-13 05/13/2014  . Pneumococcal Polysaccharide-23 06/07/2012  . Td 04/19/2007, 05/05/2011  . Tdap 05/05/2011  . Zoster 04/27/2008    Health Maintenance  Topic Date Due  . OPHTHALMOLOGY EXAM  10/11/2017  . INFLUENZA VACCINE  03/07/2018  . FOOT EXAM  03/22/2018  . URINE MICROALBUMIN  04/23/2018  . HEMOGLOBIN A1C  04/24/2018  . TETANUS/TDAP  05/04/2021  . COLONOSCOPY  02/24/2025  . DEXA SCAN  Completed  . PNA vac Low Risk Adult  Completed     Discussed health benefits of physical activity, and  encouraged her to engage in regular exercise appropriate for her age and condition.  RTC 2-3 months for DM/A1C TIIDM HTN HLD.   --------------------------------------------------------------------   I have done the exam and reviewed the above chart and it is accurate to the best of my knowledge. Development worker, community has been used in this note in any air is in the dictation or transcription are unintentional.  [name], MD  [place]

## 2018-01-09 NOTE — Progress Notes (Signed)
 Subjective:   Tyrina W Yon is a 77 y.o. female who presents for Medicare Annual (Subsequent) preventive examination.  Review of Systems:  N/A  Cardiac Risk Factors include: advanced age (>55men, >65 women);diabetes mellitus;dyslipidemia;hypertension;obesity (BMI >30kg/m2)     Objective:     Vitals: BP 134/82 (BP Location: Right Arm)   Pulse 78   Temp 98.9 F (37.2 C) (Oral)   Ht 5' 7" (1.702 m)   Wt 213 lb 12.8 oz (97 kg)   BMI 33.49 kg/m   Body mass index is 33.49 kg/m.  Advanced Directives 01/09/2018 09/26/2017 09/04/2017 06/08/2017 04/13/2017 12/06/2016 10/12/2016  Does Patient Have a Medical Advance Directive? No No No No No No No  Does patient want to make changes to medical advance directive? - No - Patient declined - - Yes (MAU/Ambulatory/Procedural Areas - Information given) - -  Would patient like information on creating a medical advance directive? No - Patient declined - No - Patient declined - - - No - Patient declined    Tobacco Social History   Tobacco Use  Smoking Status Never Smoker  Smokeless Tobacco Never Used     Counseling given: Not Answered   Clinical Intake:  Pre-visit preparation completed: Yes  Pain : No/denies pain Pain Score: 0-No pain     Nutritional Status: BMI > 30  Obese Nutritional Risks: None Diabetes: Yes(type 2) CBG done?: No Did pt. bring in CBG monitor from home?: No  How often do you need to have someone help you when you read instructions, pamphlets, or other written materials from your doctor or pharmacy?: 1 - Never  Interpreter Needed?: No  Information entered by :: Mmarkoski, LPN  Past Medical History:  Diagnosis Date  . Breast cancer (HCC)   . 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  . Cystitis   . Diabetes mellitus without complication (HCC)    NO MEDS-DIET CONTROLLED  . GERD (gastroesophageal reflux disease)     . Hernia 2011  . Hyperlipidemia   . Hypertension   . 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 Chemotherapy/post wide excision whole breast radiation.  . 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 Personal history of chemotherapy 2013  . Personal history of malignant neoplasm of breast   . Personal history of radiation therapy 2017    Rt  . Vertigo    Past Surgical History:  Procedure Laterality Date  . BREAST BIOPSY Left 07/18/2011   +  . BREAST BIOPSY Left 09/21/2015   neg  . BREAST BIOPSY Left 04/05/2016   neg  . BREAST EXCISIONAL BIOPSY Right 2017   +  . BREAST LUMPECTOMY Right 09/30/2015  . BREAST LUMPECTOMY Left 08/17/2011  . BREAST LUMPECTOMY WITH SENTINEL LYMPH NODE BIOPSY Right 09/30/2015   Procedure: BREAST LUMPECTOMY WITH SENTINEL LYMPH NODE BX;  Surgeon: Jeffrey W Byrnett, MD;  Location: [facility] ORS;  Service: General;  Laterality: Right;  . BREAST SURGERY Left 2012   wide excision  . BREAST SURGERY Left November 2013   Core biopsy of the upper-outer quadrant showed fat necrosis.  . CHOLECYSTECTOMY  2007  . COLONOSCOPY  2011   Dr Oh  . COLONOSCOPY WITH PROPOFOL N/A 02/25/2015   Procedure: COLONOSCOPY WITH PROPOFOL;  Surgeon: Paul Y Oh, MD;  Location: [facility];  Service: Gastroenterology;    Laterality: N/A;  . DILATION AND CURETTAGE OF UTERUS  2004  . HERNIA REPAIR Right 09/10/2007   Umbilical/ventral hernia repaired with 6.4 cm Proceed ventral patch  . PORT-A-CATH REMOVAL    . PORTACATH PLACEMENT  2013  . 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  . UPPER GI ENDOSCOPY  2011   Family History  Problem Relation Age of Onset  . Lymphoma Father   . Ovarian cancer Sister   . Colon cancer Brother   . Lymphoma Brother   . Cancer Other         stomach  . Cancer Other        stomach  . Breast cancer Neg Hx    Social History   Socioeconomic History  . Marital status: Married    Spouse name: Not on file  . Number of children: 2  . Years of education: H/S  . Highest education level: 12th grade  Occupational History  . Occupation: Retired  Social Needs  . Financial resource strain: Not hard at all  . Food insecurity:    Worry: Never true    Inability: Never true  . Transportation needs:    Medical: No    Non-medical: No  Tobacco Use  . Smoking status: Never Smoker  . Smokeless tobacco: Never Used  Substance and Sexual Activity  . Alcohol use: No  . Drug use: No  . Sexual activity: Not on file  Lifestyle  . Physical activity:    Days per week: Not on file    Minutes per session: Not on file  . Stress: Only a little  Relationships  . Social connections:    Talks on phone: Not on file    Gets together: Not on file    Attends religious service: Not on file    Active member of club or organization: Not on file    Attends meetings of clubs or organizations: Not on file    Relationship status: Not on file  Other Topics Concern  . Not on file  Social History Narrative  . Not on file    Outpatient Encounter Medications as of 01/09/2018  Medication Sig  . ACCU-CHEK FASTCLIX LANCETS MISC Check sugar once daily DX E11.9  . acetaminophen (TYLENOL) 500 MG tablet Take 500 mg by mouth every 6 (six) hours as needed for mild pain or headache.   . acyclovir (ZOVIRAX) 200 MG capsule TAKE 1 CAPSULE EVERY DAY  . Alcohol Swabs (B-D SINGLE USE SWABS REGULAR) PADS Check sugar once daily DX E11.9  . aspirin EC 81 MG tablet Take 81 mg by mouth daily.  . atorvastatin (LIPITOR) 10 MG tablet TAKE 1 TABLET EVERY EVENING  . bimatoprost (LUMIGAN) 0.01 % SOLN Place 1 drop into both eyes at bedtime.   . Blood Glucose Calibration (ACCU-CHEK SMARTVIEW CONTROL) LIQD   . Calcium Carb-Cholecalciferol (CALCIUM 600+D3) 600-800 MG-UNIT TABS Take  1 tablet by mouth 2 (two) times daily.  . cholecalciferol (VITAMIN D) 1000 units tablet Take 1,000 Units by mouth daily.  . cyanocobalamin 500 MCG tablet Take 500 mcg by mouth daily.  . escitalopram (LEXAPRO) 10 MG tablet Take 1 tablet (10 mg total) by mouth daily. (Patient taking differently: Take 10 mg by mouth daily. )  . escitalopram (LEXAPRO) 20 MG tablet TAKE 1 TABLET EVERY DAY (Patient taking differently: TAKE 1 TABLET EVERY DAY (alternates between this and 2 10 mg   tablets (total 65m) daily))  . gabapentin (NEURONTIN) 300 MG capsule Take 2 capsules (600 mg total) by mouth at bedtime.  .Marland Kitchenglucose blood (ACCU-CHEK SMARTVIEW) test strip Check sugar once daily DX E11.9  . latanoprost (XALATAN) 0.005 % ophthalmic solution Place 1 drop into both eyes at bedtime.   .Marland Kitchenletrozole (FEMARA) 2.5 MG tablet Take 1 tablet (2.5 mg total) by mouth daily.  . meclizine (ANTIVERT) 25 MG tablet Take 0.5 tablets (12.5 mg total) by mouth 3 (three) times daily as needed for dizziness.  . meloxicam (MOBIC) 15 MG tablet TAKE 1 TABLET EVERY DAY  . metFORMIN (GLUCOPHAGE) 500 MG tablet Take 1 tablet (500 mg total) by mouth 2 (two) times daily with a meal.  . metoprolol tartrate (LOPRESSOR) 25 MG tablet TAKE 1/2 TABLET TWICE DAILY  . Multiple Vitamin (MULTIVITAMIN WITH MINERALS) TABS tablet Take 1 tablet by mouth daily.  .Marland Kitchenomega-3 acid ethyl esters (LOVAZA) 1 g capsule Take 3 g by mouth daily.   . pantoprazole (PROTONIX) 40 MG tablet Take 1 tablet (40 mg total) by mouth 2 (two) times daily.  .Marland Kitchenpyridoxine (B-6) 100 MG tablet Take 100 mg by mouth daily.  . vitamin C (ASCORBIC ACID) 500 MG tablet Take 1,000 mg by mouth daily.  . vitamin E 400 UNIT capsule Take 400 Units by mouth daily.   No facility-administered encounter medications on file as of 01/09/2018.     Activities of Daily Living In your present state of health, do you have any difficulty performing the following activities: 01/09/2018  Hearing? N  Vision? N    Difficulty concentrating or making decisions? N  Walking or climbing stairs? N  Dressing or bathing? N  Doing errands, shopping? N  Preparing Food and eating ? N  Using the Toilet? N  In the past six months, have you accidently leaked urine? Y  Comment Rare, does not have to wear protection.  Do you have problems with loss of bowel control? N  Managing your Medications? N  Managing your Finances? N  Housekeeping or managing your Housekeeping? N  Some recent data might be hidden    Patient Care Team: GJerrol Banana, MD as PCP - General (Family Medicine) Byrnett, JForest Gleason MD (General Surgery) BCammie Sickle MD as Consulting Physician (Internal Medicine) BLorelee Cover, MD as Consulting Physician (Ophthalmology) CNoreene Filbert MD as Referring Physician (Radiation Oncology)    Assessment:   This is a routine wellness examination for PBrigitt  Exercise Activities and Dietary recommendations Current Exercise Habits: Structured exercise class, Type of exercise: walking, Time (Minutes): 60, Frequency (Times/Week): 3, Weekly Exercise (Minutes/Week): 180, Intensity: Mild, Exercise limited by: None identified  Goals    . DIET - INCREASE LEAN PROTEINS     Recommend to continue current diet regimen of eating chicken that is broil or grilled only and avoiding other fatty meats.        Fall Risk Fall Risk  01/09/2018 12/06/2016 10/18/2015 05/14/2015  Falls in the past year? Yes No No No  Number falls in past yr: 1 - - -  Injury with Fall? No - - -  Follow up Falls prevention discussed - - -   Is the patient's home free of loose throw rugs in walkways, pet beds, electrical cords, etc?   yes      Grab bars in the bathroom? yes      Handrails on the stairs?   yes      Adequate lighting?  yes  Timed Get Up and Go performed: N/A  Depression Screen PHQ 2/9 Scores 01/09/2018 02/20/2017 12/06/2016 12/06/2016  PHQ - 2 Score 0 2 0 0  PHQ- 9 Score - 6 0 -     Cognitive  Function: Pt declined screening today.      6CIT Screen 12/06/2016  What Year? 0 points  What month? 0 points  What time? 0 points  Count back from 20 0 points  Months in reverse 4 points  Repeat phrase 4 points  Total Score 8    Immunization History  Administered Date(s) Administered  . Influenza, High Dose Seasonal PF 05/13/2014, 05/29/2015, 04/05/2016, 04/19/2017  . Influenza,inj,Quad PF,6+ Mos 05/23/2013  . Pneumococcal Conjugate-13 05/13/2014  . Pneumococcal Polysaccharide-23 06/07/2012  . Td 04/19/2007, 05/05/2011  . Tdap 05/05/2011  . Zoster 04/27/2008    Qualifies for Shingles Vaccine? Due for Shingles vaccine. Declined my offer to administer today. Education has been provided regarding the importance of this vaccine. Pt has been advised to call her insurance company to determine her out of pocket expense. Advised she may also receive this vaccine at her local pharmacy or Health Dept. Verbalized acceptance and understanding.  Screening Tests Health Maintenance  Topic Date Due  . OPHTHALMOLOGY EXAM  10/11/2017  . INFLUENZA VACCINE  03/07/2018  . FOOT EXAM  03/22/2018  . URINE MICROALBUMIN  04/23/2018  . HEMOGLOBIN A1C  04/24/2018  . TETANUS/TDAP  05/04/2021  . COLONOSCOPY  02/24/2025  . DEXA SCAN  Completed  . PNA vac Low Risk Adult  Completed    Cancer Screenings: Lung: Low Dose CT Chest recommended if Age 47-80 years, 30 pack-year currently smoking OR have quit w/in 15years. Patient does not qualify. Breast:  Up to date on Mammogram? Yes   Up to date of Bone Density/Dexa? Yes Colorectal: Up to date  Additional Screenings:  Hepatitis C Screening: N/A     Plan:  I have personally reviewed and addressed the Medicare Annual Wellness questionnaire and have noted the following in the patient's chart:  A. Medical and social history B. Use of alcohol, tobacco or illicit drugs  C. Current medications and supplements D. Functional ability and status E.    Nutritional status F.  Physical activity G. Advance directives H. List of other physicians I.  Hospitalizations, surgeries, and ER visits in previous 12 months J.  [place] such as hearing and vision if needed, cognitive and depression L. Referrals and appointments - none  In addition, I have reviewed and discussed with patient certain preventive protocols, quality metrics, and best practice recommendations. A written personalized care plan for preventive services as well as general preventive health recommendations were provided to patient.  See attached scanned questionnaire for additional information.   Signed,  Fabio Neighbors, LPN Nurse Health Advisor   Nurse Recommendations: Pt states she has had an eye exam in April 2019. Will send ROI to Dr Purvis Sheffield office to retrieve exam notes.

## 2018-01-14 DIAGNOSIS — I10 Essential (primary) hypertension: Secondary | ICD-10-CM | POA: Diagnosis not present

## 2018-01-14 DIAGNOSIS — E7800 Pure hypercholesterolemia, unspecified: Secondary | ICD-10-CM | POA: Diagnosis not present

## 2018-01-15 ENCOUNTER — Telehealth: Payer: Self-pay | Admitting: Emergency Medicine

## 2018-01-15 LAB — COMPREHENSIVE METABOLIC PANEL WITH GFR
ALT: 12 IU/L (ref 0–32)
AST: 15 IU/L (ref 0–40)
Albumin/Globulin Ratio: 1.7 (ref 1.2–2.2)
Albumin: 4.4 g/dL (ref 3.5–4.8)
Alkaline Phosphatase: 76 IU/L (ref 39–117)
BUN/Creatinine Ratio: 10 — ABNORMAL LOW (ref 12–28)
BUN: 9 mg/dL (ref 8–27)
Bilirubin Total: 0.4 mg/dL (ref 0.0–1.2)
CO2: 23 mmol/L (ref 20–29)
Calcium: 9.9 mg/dL (ref 8.7–10.3)
Chloride: 100 mmol/L (ref 96–106)
Creatinine, Ser: 0.86 mg/dL (ref 0.57–1.00)
GFR calc Af Amer: 76 mL/min/1.73
GFR calc non Af Amer: 66 mL/min/1.73
Globulin, Total: 2.6 g/dL (ref 1.5–4.5)
Glucose: 150 mg/dL — ABNORMAL HIGH (ref 65–99)
Potassium: 4.5 mmol/L (ref 3.5–5.2)
Sodium: 143 mmol/L (ref 134–144)
Total Protein: 7 g/dL (ref 6.0–8.5)

## 2018-01-15 LAB — CBC WITH DIFFERENTIAL/PLATELET
BASOS ABS: 0 10*3/uL (ref 0.0–0.2)
Basos: 1 %
EOS (ABSOLUTE): 0.2 10*3/uL (ref 0.0–0.4)
Eos: 4 %
HEMOGLOBIN: 15 g/dL (ref 11.1–15.9)
Hematocrit: 44.4 % (ref 34.0–46.6)
Immature Grans (Abs): 0 10*3/uL (ref 0.0–0.1)
Immature Granulocytes: 0 %
LYMPHS ABS: 1.8 10*3/uL (ref 0.7–3.1)
Lymphs: 35 %
MCH: 30.8 pg (ref 26.6–33.0)
MCHC: 33.8 g/dL (ref 31.5–35.7)
MCV: 91 fL (ref 79–97)
Monocytes Absolute: 0.4 10*3/uL (ref 0.1–0.9)
Monocytes: 7 %
NEUTROS ABS: 2.7 10*3/uL (ref 1.4–7.0)
Neutrophils: 53 %
Platelets: 270 10*3/uL (ref 150–450)
RBC: 4.87 x10E6/uL (ref 3.77–5.28)
RDW: 12.9 % (ref 12.3–15.4)
WBC: 5.1 10*3/uL (ref 3.4–10.8)

## 2018-01-15 LAB — LIPID PANEL
CHOLESTEROL TOTAL: 122 mg/dL (ref 100–199)
Chol/HDL Ratio: 3.2 ratio (ref 0.0–4.4)
HDL: 38 mg/dL — ABNORMAL LOW (ref >39)
LDL CALC: 65 mg/dL (ref 0–99)
Triglycerides: 93 mg/dL (ref 0–149)
VLDL Cholesterol Cal: 19 mg/dL (ref 5–40)

## 2018-01-15 LAB — TSH: TSH: 2.56 u[IU]/mL (ref 0.450–4.500)

## 2018-01-15 NOTE — Telephone Encounter (Signed)
LMTCB

## 2018-01-15 NOTE — Telephone Encounter (Signed)
Pt advised of results. 

## 2018-01-15 NOTE — Telephone Encounter (Signed)
-----   Message from Jerrol Banana., MD sent at 01/15/2018  9:30 AM EDT ----- Stable.

## 2018-01-23 ENCOUNTER — Inpatient Hospital Stay: Payer: Medicare HMO | Attending: Oncology

## 2018-01-23 ENCOUNTER — Encounter: Payer: Self-pay | Admitting: Oncology

## 2018-01-23 ENCOUNTER — Inpatient Hospital Stay: Payer: Medicare HMO | Admitting: Oncology

## 2018-01-23 VITALS — BP 152/91 | HR 83 | Temp 96.7°F | Resp 16 | Wt 214.0 lb

## 2018-01-23 DIAGNOSIS — R0789 Other chest pain: Secondary | ICD-10-CM | POA: Diagnosis not present

## 2018-01-23 DIAGNOSIS — C50411 Malignant neoplasm of upper-outer quadrant of right female breast: Secondary | ICD-10-CM | POA: Diagnosis not present

## 2018-01-23 DIAGNOSIS — Z79811 Long term (current) use of aromatase inhibitors: Secondary | ICD-10-CM | POA: Diagnosis not present

## 2018-01-23 DIAGNOSIS — Z170 Estrogen receptor positive status [ER+]: Secondary | ICD-10-CM

## 2018-01-23 DIAGNOSIS — Z923 Personal history of irradiation: Secondary | ICD-10-CM

## 2018-01-23 LAB — CBC WITH DIFFERENTIAL/PLATELET
BASOS PCT: 1 %
Basophils Absolute: 0.1 10*3/uL (ref 0–0.1)
EOS ABS: 0.1 10*3/uL (ref 0–0.7)
EOS PCT: 3 %
HCT: 43.1 % (ref 35.0–47.0)
HEMOGLOBIN: 15 g/dL (ref 12.0–16.0)
Lymphocytes Relative: 34 %
Lymphs Abs: 1.9 10*3/uL (ref 1.0–3.6)
MCH: 31.2 pg (ref 26.0–34.0)
MCHC: 34.7 g/dL (ref 32.0–36.0)
MCV: 89.9 fL (ref 80.0–100.0)
MONO ABS: 0.4 10*3/uL (ref 0.2–0.9)
MONOS PCT: 8 %
NEUTROS PCT: 54 %
Neutro Abs: 3.1 10*3/uL (ref 1.4–6.5)
PLATELETS: 255 10*3/uL (ref 150–440)
RBC: 4.79 MIL/uL (ref 3.80–5.20)
RDW: 12.9 % (ref 11.5–14.5)
WBC: 5.7 10*3/uL (ref 3.6–11.0)

## 2018-01-23 LAB — COMPREHENSIVE METABOLIC PANEL
ALBUMIN: 4.1 g/dL (ref 3.5–5.0)
ALT: 15 U/L (ref 14–54)
ANION GAP: 9 (ref 5–15)
AST: 23 U/L (ref 15–41)
Alkaline Phosphatase: 71 U/L (ref 38–126)
BUN: 11 mg/dL (ref 6–20)
CO2: 24 mmol/L (ref 22–32)
Calcium: 9.7 mg/dL (ref 8.9–10.3)
Chloride: 104 mmol/L (ref 101–111)
Creatinine, Ser: 0.81 mg/dL (ref 0.44–1.00)
GFR calc Af Amer: >60 mL/min (ref >60)
GFR calc non Af Amer: >60 mL/min (ref >60)
GLUCOSE: 130 mg/dL — AB (ref 65–99)
POTASSIUM: 3.9 mmol/L (ref 3.5–5.1)
SODIUM: 137 mmol/L (ref 135–145)
Total Bilirubin: 0.4 mg/dL (ref 0.3–1.2)
Total Protein: 7.5 g/dL (ref 6.5–8.1)

## 2018-01-23 NOTE — Assessment & Plan Note (Addendum)
2017 right breast cancer stage I ER/PR positive HER-2/neu negative status post lumpectomy. No chemotherapy. Status post adjuvant radiation.   Currently on Femara.  Tolerating well without any side effects.  Had recent bone scan (May 2019) revealing a T score of -0.3.  She continues calcium and vitamin D.  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  It was recommended she have physical therapy.  States this is  improving without the use of physical therapy.  Constipation: Resolved.  RTC in 4 months with labs and MD assessment.

## 2018-01-23 NOTE — Progress Notes (Signed)
[facility] OFFICE PROGRESS NOTE  Patient Care Team: Jerrol Banana., MD as PCP - General (Family Medicine) Bary Castilla, Forest Gleason, MD (General Surgery) Cammie Sickle, MD as Consulting Physician (Internal Medicine) Lorelee Cover., MD as Consulting Physician (Ophthalmology) Noreene Filbert, MD as Referring Physician (Radiation Oncology)   SUMMARY OF HEMATOLOGIC/ONCOLOGIC HISTORY:  Oncology History   # FEB 2017- RIGHT BREAST  STAGE I [T1b N0 M0]. ER/PR ER positive 100% PR positive 95% HER 2 FISH negative;  INVASIVE MAMMARY CARCINOMA WITH MICROPAPILLARY FEATURES. S/p Lumpec [Dr.Byrnett] & RT; No Oncotype; Femara  # 2013- LEFT BREAST- STAGE II; ER/PR/ Her 2 NEG s/p Lumpec [Dr.Byrnett] & RT  # BMD- April  2017- wnl; genetic testing- declined [facility] 2017]  # Vertigo [uses rolling walker]     Carcinoma of upper-outer quadrant of right breast in female, estrogen receptor positive ([place])     INTERVAL HISTORY: Patient presents today with above history of breast cancer diagnosed in February 2017 is here for follow-up.  She is currently on Femara and tolerating well.  She denies any hot flashes.  She denies any headaches.  Denies constipation or diarrhea.  Was recently seen by Dr. Fleet Contras and had breast exam performed. No issues per patient.  REVIEW OF SYSTEMS:   Review of Systems  Constitutional: Negative.  Negative for chills, fever, malaise/fatigue and weight loss.  HENT: Negative for congestion and ear pain.   Eyes: Negative.  Negative for blurred vision and double vision.  Respiratory: Negative.  Negative for cough, sputum production and shortness of breath.   Cardiovascular: Negative.  Negative for chest pain, palpitations and leg swelling.  Gastrointestinal: Negative.  Negative for abdominal pain, constipation, diarrhea, nausea and vomiting.  Genitourinary: Negative for dysuria, frequency and urgency.  Musculoskeletal: Negative for back pain and falls.   Skin: Negative.  Negative for rash.  Neurological: Negative.  Negative for weakness and headaches.  Endo/Heme/Allergies: Negative.  Does not bruise/bleed easily.  Psychiatric/Behavioral: Negative.  Negative for depression. The patient is not nervous/anxious and does not have insomnia.     PAST MEDICAL HISTORY :  Past Medical History:  Diagnosis Date  . Breast cancer ([place])   . Breast cancer of upper-inner quadrant of right female breast ([place]) 09/21/2015   T1bN0 " 35m; ER100%, PR 90%, HER-2/neu not overexpressed.  . Cystitis   . Diabetes mellitus without complication (H[facility])    NO MEDS-DIET CONTROLLED  . GERD (gastroesophageal reflux disease)   . Hernia 2011  . Hyperlipidemia   . Hypertension   . Malignant ne[place] of upper-inner quadrant of [place] 08/17/2011   Left, T2 (2.3 cm) N0, triple negative. Chemotherapy/post wide excision whole breast radiation.  . 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 Personal history of chemotherapy 2013  . Personal history of malignant neoplasm of breast   . Personal history of radiation therapy 2017    Rt  . Vertigo     PAST SURGICAL HISTORY :   Past Surgical History:  Procedure Laterality Date  . BREAST BIOPSY Left 07/18/2011   +  . BREAST BIOPSY Left 09/21/2015   neg  . BREAST BIOPSY Left 04/05/2016   neg  . BREAST EXCISIONAL BIOPSY Right 2017   +  . BREAST LUMPECTOMY Right 09/30/2015  . BREAST LUMPECTOMY Left 08/17/2011  . BREAST LUMPECTOMY WITH SENTINEL LYMPH NODE BIOPSY Right 09/30/2015   Procedure: BREAST LUMPECTOMY WITH SENTINEL LYMPH NODE BX;  Surgeon: JRobert Bellow  MD;  Location: [facility] ORS;  Service: General;  Laterality: Right;  . BREAST SURGERY Left 2012   wide excision  . BREAST SURGERY Left November 2013   Core biopsy of the upper-outer quadrant showed fat necrosis.  . CHOLECYSTECTOMY  2007  . COLONOSCOPY  2011   Dr Candace Cruise  . COLONOSCOPY WITH PROPOFOL N/A 02/25/2015   Procedure:  COLONOSCOPY WITH PROPOFOL;  Surgeon: Hulen Luster, MD;  Location: [facility] ENDOSCOPY;  Service: Gastroenterology;  Laterality: N/A;  . DILATION AND CURETTAGE OF UTERUS  2004  . HERNIA REPAIR Right 45/99/7741   Umbilical/ventral hernia repaired with 6.4 cm Proceed ventral patch  . PORT-A-CATH REMOVAL    . PORTACATH PLACEMENT  2013  . 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  [facility] Kitchen UPPER GI ENDOSCOPY  2011    FAMILY HISTORY :   Family History  Problem Relation Age of Onset  . Lymphoma Father   . Ovarian cancer Sister   . Colon cancer Brother   . Lymphoma Brother   . Cancer Other        stomach  . Cancer Other        stomach  . Breast cancer Neg Hx     SOCIAL HISTORY:   Social History   Tobacco Use  . Smoking status: Never Smoker  . Smokeless tobacco: Never Used  Substance Use Topics  . Alcohol use: No  . Drug use: No    ALLERGIES:  has No Known Allergies.  MEDICATIONS:  Current Outpatient Medications  Medication Sig Dispense Refill  . ACCU-CHEK FASTCLIX LANCETS MISC Check sugar once daily DX E11.9 102 each 3  . acetaminophen (TYLENOL) 500 MG tablet Take 500 mg by mouth every 6 (six) hours as needed for mild pain or headache.     [facility] Kitchen acyclovir (ZOVIRAX) 200 MG capsule TAKE 1 CAPSULE EVERY DAY 90 capsule 3  . Alcohol Swabs (B-D SINGLE USE SWABS REGULAR) PADS Check sugar once daily DX E11.9 100 each 3  . aspirin EC 81 MG tablet Take 81 mg by mouth daily.    [facility] Kitchen atorvastatin (LIPITOR) 10 MG tablet TAKE 1 TABLET EVERY EVENING 90 tablet 3  . bimatoprost (LUMIGAN) 0.01 % SOLN Place 1 drop into both eyes at bedtime.     . Blood Glucose Calibration (ACCU-CHEK SMARTVIEW CONTROL) LIQD     . Calcium Carb-Cholecalciferol (CALCIUM 600+D3) 600-800 MG-UNIT TABS Take 1 tablet by mouth 2 (two) times daily.    . cholecalciferol (VITAMIN D) 1000 units tablet Take 1,000 Units by mouth  daily.    . cyanocobalamin 500 MCG tablet Take 500 mcg by mouth daily.    Marland Kitchen escitalopram (LEXAPRO) 10 MG tablet Take 1 tablet (10 mg total) by mouth daily. (Patient taking differently: Take 10 mg by mouth daily. ) 30 tablet 12  . escitalopram (LEXAPRO) 20 MG tablet TAKE 1 TABLET EVERY DAY (Patient taking differently: TAKE 1 TABLET EVERY DAY (alternates between this and 2 10 mg tablets (total 71m) daily)) 90 tablet 3  . gabapentin (NEURONTIN) 300 MG capsule Take 2 capsules (600 mg total) by mouth at bedtime. 180 capsule 3  . glucose blood (ACCU-CHEK SMARTVIEW) test strip Check sugar once daily DX E11.9 100 each 3  . latanoprost (XALATAN) 0.005 % ophthalmic solution Place 1 drop into both eyes at bedtime.     .Marland Kitchenletrozole (FEMARA) 2.5 MG tablet Take 1 tablet (2.5 mg total)  by mouth daily. 90 tablet 3  . meclizine (ANTIVERT) 25 MG tablet Take 0.5 tablets (12.5 mg total) by mouth 3 (three) times daily as needed for dizziness. 15 tablet 0  . meloxicam (MOBIC) 15 MG tablet TAKE 1 TABLET EVERY DAY 90 tablet 3  . metFORMIN (GLUCOPHAGE) 500 MG tablet Take 1 tablet (500 mg total) by mouth 2 (two) times daily with a meal. 180 tablet 3  . metoprolol tartrate (LOPRESSOR) 25 MG tablet TAKE 1/2 TABLET TWICE DAILY 90 tablet 3  . Multiple Vitamin (MULTIVITAMIN WITH MINERALS) TABS tablet Take 1 tablet by mouth daily.    Marland Kitchen omega-3 acid ethyl esters (LOVAZA) 1 g capsule Take 3 g by mouth daily.     . pantoprazole (PROTONIX) 40 MG tablet Take 1 tablet (40 mg total) by mouth 2 (two) times daily. 180 tablet 3  . pyridoxine (B-6) 100 MG tablet Take 100 mg by mouth daily.    . vitamin C (ASCORBIC ACID) 500 MG tablet Take 1,000 mg by mouth daily.    . vitamin E 400 UNIT capsule Take 400 Units by mouth daily.     No current facility-administered medications for this visit.     PHYSICAL EXAMINATION: ECOG PERFORMANCE STATUS: 0 - Asymptomatic  BP (!) 152/91 (BP Location: Right Arm, Patient Position: Sitting)   Pulse 83    Temp (!) 96.7 F (35.9 C) (Tympanic)   Resp 16   Wt 214 lb (97.1 kg)   BMI 33.52 kg/m   Filed Weights   01/23/18 1429  Weight: 214 lb (97.1 kg)   Physical Exam  Constitutional: She is oriented to person, place, and time and well-developed, well-nourished, and in no distress. Vital signs are normal.  HENT:  Head: Normocephalic and atraumatic.  Eyes: Pupils are equal, round, and reactive to light.  Neck: Normal range of motion.  Cardiovascular: Normal rate, regular rhythm and normal heart sounds.  No murmur heard. Pulmonary/Chest: Effort normal and breath sounds normal. She has no wheezes.  Abdominal: Soft. Normal appearance and bowel sounds are normal. She exhibits no distension. There is no tenderness.  Musculoskeletal: Normal range of motion. She exhibits no edema.  Neurological: She is alert and oriented to person, place, and time. Gait normal.  Skin: Skin is warm and dry. No rash noted.  Psychiatric: Mood, memory, affect and judgment normal.     LABORATORY DATA:  I have reviewed the data as listed    Component Value Date/Time   NA 137 01/23/2018 1316   NA 143 01/14/2018 1014   NA 140 12/23/2012 0949   K 3.9 01/23/2018 1316   K 4.7 12/23/2012 0949   CL 104 01/23/2018 1316   CL 101 12/23/2012 0949   CO2 24 01/23/2018 1316   CO2 30 12/23/2012 0949   GLUCOSE 130 (H) 01/23/2018 1316   GLUCOSE 168 (H) 12/23/2012 0949   BUN 11 01/23/2018 1316   BUN 9 01/14/2018 1014   BUN 9 12/23/2012 0949   CREATININE 0.81 01/23/2018 1316   CREATININE 0.92 09/04/2014 0916   CALCIUM 9.7 01/23/2018 1316   CALCIUM 9.8 12/23/2012 0949   PROT 7.5 01/23/2018 1316   PROT 7.0 01/14/2018 1014   PROT 7.4 09/04/2014 0916   ALBUMIN 4.1 01/23/2018 1316   ALBUMIN 4.4 01/14/2018 1014   ALBUMIN 3.7 09/04/2014 0916   AST 23 01/23/2018 1316   AST 21 09/04/2014 0916   ALT 15 01/23/2018 1316   ALT 34 09/04/2014 0916   ALKPHOS 71 01/23/2018 1316  ALKPHOS 85 09/04/2014 0916   BILITOT 0.4  01/23/2018 1316   BILITOT 0.4 01/14/2018 1014   BILITOT 0.4 09/04/2014 0916   GFRNONAA >60 01/23/2018 1316   GFRNONAA >60 09/04/2014 0916   GFRNONAA >60 02/18/2014 1048   GFRAA >60 01/23/2018 1316   GFRAA >60 09/04/2014 0916   GFRAA >60 02/18/2014 1048    No results found for: SPEP, UPEP  Lab Results  Component Value Date   WBC 5.7 01/23/2018   NEUTROABS 3.1 01/23/2018   HGB 15.0 01/23/2018   HCT 43.1 01/23/2018   MCV 89.9 01/23/2018   PLT 255 01/23/2018      Chemistry      Component Value Date/Time   NA 137 01/23/2018 1316   NA 143 01/14/2018 1014   NA 140 12/23/2012 0949   K 3.9 01/23/2018 1316   K 4.7 12/23/2012 0949   CL 104 01/23/2018 1316   CL 101 12/23/2012 0949   CO2 24 01/23/2018 1316   CO2 30 12/23/2012 0949   BUN 11 01/23/2018 1316   BUN 9 01/14/2018 1014   BUN 9 12/23/2012 0949   CREATININE 0.81 01/23/2018 1316   CREATININE 0.92 09/04/2014 0916      Component Value Date/Time   CALCIUM 9.7 01/23/2018 1316   CALCIUM 9.8 12/23/2012 0949   ALKPHOS 71 01/23/2018 1316   ALKPHOS 85 09/04/2014 0916   AST 23 01/23/2018 1316   AST 21 09/04/2014 0916   ALT 15 01/23/2018 1316   ALT 34 09/04/2014 0916   BILITOT 0.4 01/23/2018 1316   BILITOT 0.4 01/14/2018 1014   BILITOT 0.4 09/04/2014 0916     IMPRESSION: 1. Diffuse advanced disc degeneration and bulging without thoracic cord impingement. 2. Disc degeneration and facet hypertrophy causes notable high-grade foraminal stenosis on the left at T10-11 and T11-12. 3. On the symptomatic right side there is moderate foraminal stenosis at T11-12. 4. Right paracentral disc protrusions at T10-11 and T12-L1, which could be a source of root impingement. 5. L1-2 advanced disc degeneration with moderate canal and right foraminal stenosis.   Electronically Signed   By: Monte Fantasia M.D.   On: 09/07/2017 08:49   ASSESSMENT & PLAN:   Carcinoma of upper-outer quadrant of right breast in female, estrogen  receptor positive ([place]) 2017 right breast cancer stage I ER/PR positive HER-2/neu negative status post lumpectomy. No chemotherapy. Status post adjuvant radiation.   Currently on Femara.  Tolerating well without any side effects.  Had recent bone scan (May 2019) revealing a T score of -0.3.  She continues calcium and vitamin D.  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  It was recommended she have physical therapy.  States this is  improving without the use of physical therapy.  Constipation: Resolved.  RTC in 4 months with labs and MD assessment.        Jacquelin Hawking, NP 01/23/2018 4:09 PM

## 2018-01-24 LAB — CANCER ANTIGEN 27.29: CA 27.29: 16.3 U/mL (ref 0.0–38.6)

## 2018-01-28 ENCOUNTER — Other Ambulatory Visit: Payer: Self-pay | Admitting: *Deleted

## 2018-01-28 NOTE — Telephone Encounter (Signed)
Requesting rx for latanoprost (XALATAN) 0.005 % ophthalmic solution br sent to [facility].

## 2018-01-29 NOTE — Telephone Encounter (Signed)
Pt stated she was returning a missed call from our office. Please advise. Thanks TNP

## 2018-01-29 NOTE — Telephone Encounter (Signed)
yes

## 2018-01-29 NOTE — Telephone Encounter (Signed)
Advised  ED 

## 2018-01-29 NOTE — Telephone Encounter (Signed)
Should this be from her eye doctor?

## 2018-02-21 ENCOUNTER — Ambulatory Visit: Payer: Self-pay | Admitting: Family Medicine

## 2018-03-05 ENCOUNTER — Other Ambulatory Visit: Payer: Self-pay | Admitting: Family Medicine

## 2018-03-07 ENCOUNTER — Other Ambulatory Visit: Payer: Self-pay | Admitting: Family Medicine

## 2018-03-08 ENCOUNTER — Other Ambulatory Visit: Payer: Self-pay | Admitting: Family Medicine

## 2018-03-12 MED ORDER — GLUCOSE BLOOD VI STRP: 1.0000 | ORAL_STRIP | Freq: Every day | 3 refills | Status: DC

## 2018-03-19 ENCOUNTER — Other Ambulatory Visit: Payer: Self-pay

## 2018-03-19 DIAGNOSIS — E118 Type 2 diabetes mellitus with unspecified complications: Secondary | ICD-10-CM

## 2018-03-19 MED ORDER — GLUCOSE BLOOD VI STRP
ORAL_STRIP | 12 refills | Status: DC
Start: 2018-03-19 — End: 2018-10-17

## 2018-03-21 ENCOUNTER — Other Ambulatory Visit: Payer: Self-pay

## 2018-03-21 DIAGNOSIS — E118 Type 2 diabetes mellitus with unspecified complications: Secondary | ICD-10-CM

## 2018-03-21 MED ORDER — ACCU-CHEK AVIVA PLUS W/DEVICE KIT: 1.0000 | PACK | Freq: Once | 0 refills | Status: AC

## 2018-03-25 ENCOUNTER — Other Ambulatory Visit: Payer: Self-pay

## 2018-03-25 MED ORDER — GLUCOSE BLOOD VI STRP: ORAL_STRIP | 3 refills | Status: DC

## 2018-04-11 ENCOUNTER — Ambulatory Visit (INDEPENDENT_AMBULATORY_CARE_PROVIDER_SITE_OTHER): Payer: Medicare HMO | Admitting: Family Medicine

## 2018-04-11 ENCOUNTER — Encounter: Payer: Self-pay | Admitting: Family Medicine

## 2018-04-11 VITALS — BP 124/70 | HR 76 | Temp 98.3°F | Resp 16 | Wt 210.0 lb

## 2018-04-11 DIAGNOSIS — Z23 Encounter for immunization: Secondary | ICD-10-CM | POA: Diagnosis not present

## 2018-04-11 DIAGNOSIS — E118 Type 2 diabetes mellitus with unspecified complications: Secondary | ICD-10-CM

## 2018-04-11 LAB — POCT GLYCOSYLATED HEMOGLOBIN (HGB A1C): HEMOGLOBIN A1C: 7.2 % — AB (ref 4.0–5.6)

## 2018-04-11 NOTE — Progress Notes (Signed)
Patient: Leah Schaefer Female    DOB: Aug 21, 1940   77 y.o.   MRN: [id number] Visit Date: 04/11/2018  Today's Provider: [name], MD   Chief Complaint  Patient presents with  . Diabetes   Subjective:    HPI  Diabetes Mellitus Type II, Follow-up:   Lab Results  Component Value Date   HGBA1C 7.2 (A) 04/11/2018   HGBA1C 7.1 10/22/2017   HGBA1C 7.0 07/24/2017    Last seen for diabetes 3 months ago.  Management since then includes no changes. She reports good compliance with treatment. She is not having side effects.  Current symptoms include none and have been stable. Home blood sugar records: fasting range: 150s  Episodes of hypoglycemia? no  Weight trend: stable Prior visit with dietician: no Current diet: well balanced Current exercise: aerobics and swimming  Pertinent Labs:    Component Value Date/Time   CHOL 122 01/14/2018 1014   TRIG 93 01/14/2018 1014   HDL 38 (L) 01/14/2018 1014   LDLCALC 65 01/14/2018 1014   CREATININE 0.81 01/23/2018 1316   CREATININE 0.92 09/04/2014 0916    Wt Readings from Last 3 Encounters:  04/11/18 210 lb (95.3 kg)  01/23/18 214 lb (97.1 kg)  01/09/18 213 lb 12.8 oz (97 kg)       No Known Allergies   Current Outpatient Medications:  .  ACCU-CHEK FASTCLIX LANCETS MISC, CHECK SUGAR ONCE DAILY , Disp: 102 each, Rfl: 3 .  acetaminophen (TYLENOL) 500 MG tablet, Take 500 mg by mouth every 6 (six) hours as needed for mild pain or headache. , Disp: , Rfl:  .  acyclovir (ZOVIRAX) 200 MG capsule, TAKE 1 CAPSULE EVERY DAY, Disp: 90 capsule, Rfl: 3 .  Alcohol Swabs (B-D SINGLE USE SWABS REGULAR) PADS, CHECK SUGAR ONCE DAILY, Disp: 100 each, Rfl: 3 .  aspirin EC 81 MG tablet, Take 81 mg by mouth daily., Disp: , Rfl:  .  atorvastatin (LIPITOR) 10 MG tablet, TAKE 1 TABLET EVERY EVENING, Disp: 90 tablet, Rfl: 3 .  bimatoprost (LUMIGAN) 0.01 % SOLN, Place 1 drop into both eyes at bedtime. , Disp: , Rfl:  .  Blood Glucose  Calibration (ACCU-CHEK SMARTVIEW CONTROL) LIQD, , Disp: , Rfl:  .  Calcium Carb-Cholecalciferol (CALCIUM 600+D3) 600-800 MG-UNIT TABS, Take 1 tablet by mouth 2 (two) times daily., Disp: , Rfl:  .  cholecalciferol (VITAMIN D) 1000 units tablet, Take 1,000 Units by mouth daily., Disp: , Rfl:  .  cyanocobalamin 500 MCG tablet, Take 500 mcg by mouth daily., Disp: , Rfl:  .  escitalopram (LEXAPRO) 10 MG tablet, Take 1 tablet (10 mg total) by mouth daily. (Patient taking differently: Take 10 mg by mouth daily. ), Disp: 30 tablet, Rfl: 12 .  escitalopram (LEXAPRO) 20 MG tablet, TAKE 1 TABLET EVERY DAY (Patient taking differently: TAKE 1 TABLET EVERY DAY (alternates between this and 2 10 mg tablets (total 20mg ) daily)), Disp: 90 tablet, Rfl: 3 .  gabapentin (NEURONTIN) 300 MG capsule, Take 2 capsules (600 mg total) by mouth at bedtime., Disp: 180 capsule, Rfl: 3 .  glucose blood (ACCU-CHEK SMARTVIEW) test strip, 1 each by Other route daily. Use as instructed, Disp: 100 each, Rfl: 3 .  glucose blood (ACCU-CHEK SMARTVIEW) test strip, Use as instructed, Disp: 100 each, Rfl: 12 .  glucose blood (ACCU-CHEK SMARTVIEW) test strip, CHECK  SUGAR ONE TIME DAILY, Disp: 100 each, Rfl: 3 .  latanoprost (XALATAN) 0.005 % ophthalmic solution, Place 1  drop into both eyes at bedtime. , Disp: , Rfl:  .  letrozole (FEMARA) 2.5 MG tablet, Take 1 tablet (2.5 mg total) by mouth daily., Disp: 90 tablet, Rfl: 3 .  meclizine (ANTIVERT) 25 MG tablet, Take 0.5 tablets (12.5 mg total) by mouth 3 (three) times daily as needed for dizziness., Disp: 15 tablet, Rfl: 0 .  meloxicam (MOBIC) 15 MG tablet, TAKE 1 TABLET EVERY DAY, Disp: 90 tablet, Rfl: 3 .  metFORMIN (GLUCOPHAGE) 500 MG tablet, Take 1 tablet (500 mg total) by mouth 2 (two) times daily with a meal., Disp: 180 tablet, Rfl: 3 .  metoprolol tartrate (LOPRESSOR) 25 MG tablet, TAKE 1/2 TABLET TWICE DAILY, Disp: 90 tablet, Rfl: 3 .  Multiple Vitamin (MULTIVITAMIN WITH MINERALS) TABS  tablet, Take 1 tablet by mouth daily., Disp: , Rfl:  .  omega-3 acid ethyl esters (LOVAZA) 1 g capsule, Take 3 g by mouth daily. , Disp: , Rfl:  .  pantoprazole (PROTONIX) 40 MG tablet, Take 1 tablet (40 mg total) by mouth 2 (two) times daily., Disp: 180 tablet, Rfl: 3 .  pyridoxine (B-6) 100 MG tablet, Take 100 mg by mouth daily., Disp: , Rfl:  .  vitamin C (ASCORBIC ACID) 500 MG tablet, Take 1,000 mg by mouth daily., Disp: , Rfl:  .  vitamin E 400 UNIT capsule, Take 400 Units by mouth daily., Disp: , Rfl:   Review of Systems  Constitutional: Negative for activity change, appetite change, chills, diaphoresis, fatigue, fever and unexpected weight change.  HENT: Negative.   Eyes: Negative.   Respiratory: Negative for cough and shortness of breath.   Cardiovascular: Negative.   Gastrointestinal: Negative.   Endocrine: Negative for cold intolerance, heat intolerance, polydipsia, polyphagia and polyuria.  Genitourinary: Negative.   Musculoskeletal: Positive for arthralgias and back pain.  Allergic/Immunologic: Negative.   Neurological: Negative for dizziness, light-headedness and headaches.  Psychiatric/Behavioral: Negative.     Social History   Tobacco Use  . Smoking status: Never Smoker  . Smokeless tobacco: Never Used  Substance Use Topics  . Alcohol use: No   Objective:   BP 124/70 (BP Location: Left Arm, Patient Position: Sitting, Cuff Size: Large)   Pulse 76   Temp 98.3 F (36.8 C)   Resp 16   Wt 210 lb (95.3 kg)   SpO2 96%   BMI 32.89 kg/m  Vitals:   04/11/18 1140  BP: 124/70  Pulse: 76  Resp: 16  Temp: 98.3 F (36.8 C)  SpO2: 96%  Weight: 210 lb (95.3 kg)     Physical Exam  Constitutional: She is oriented to person, place, and time. She appears well-developed and well-nourished.  HENT:  Head: Normocephalic and atraumatic.  Right Ear: External ear normal.  Left Ear: External ear normal.  Nose: Nose normal.  Eyes: No scleral icterus.  Neck: No thyromegaly  present.  Cardiovascular: Normal rate, regular rhythm and normal heart sounds.  Pulmonary/Chest: Effort normal and breath sounds normal.  Abdominal: Soft.  Musculoskeletal: She exhibits no edema.  Neurological: She is alert and oriented to person, place, and time.  Skin: Skin is warm and dry.  Psychiatric: She has a normal mood and affect. Her behavior is normal. Judgment and thought content normal.        Assessment & Plan:     1. Type 2 diabetes mellitus with complication, without long-term current use of insulin (HCC)  - POCT glycosylated hemoglobin (Hb A1C)--7.2 today  2. Flu vaccine need  - Flu vaccine HIGH DOSE  PF (Fluzone High dose) 3.Obesity  I have done the exam and reviewed the chart and it is accurate to the best of my knowledge. Development worker, community has been used and  any errors in dictation or transcription are unintentional. [name] M.D. [place], MD  [facility]

## 2018-05-22 ENCOUNTER — Inpatient Hospital Stay: Payer: Medicare HMO | Admitting: Internal Medicine

## 2018-05-22 ENCOUNTER — Inpatient Hospital Stay: Payer: Medicare HMO

## 2018-05-23 ENCOUNTER — Inpatient Hospital Stay: Payer: Medicare HMO | Attending: Internal Medicine

## 2018-05-23 ENCOUNTER — Inpatient Hospital Stay: Payer: Medicare HMO | Admitting: Internal Medicine

## 2018-05-23 ENCOUNTER — Other Ambulatory Visit: Payer: Self-pay | Admitting: *Deleted

## 2018-05-23 VITALS — BP 125/82 | HR 78 | Temp 97.3°F | Resp 16 | Wt 206.8 lb

## 2018-05-23 DIAGNOSIS — Z7984 Long term (current) use of oral hypoglycemic drugs: Secondary | ICD-10-CM | POA: Insufficient documentation

## 2018-05-23 DIAGNOSIS — K5900 Constipation, unspecified: Secondary | ICD-10-CM

## 2018-05-23 DIAGNOSIS — Z79899 Other long term (current) drug therapy: Secondary | ICD-10-CM | POA: Insufficient documentation

## 2018-05-23 DIAGNOSIS — Z170 Estrogen receptor positive status [ER+]: Secondary | ICD-10-CM | POA: Diagnosis not present

## 2018-05-23 DIAGNOSIS — E119 Type 2 diabetes mellitus without complications: Secondary | ICD-10-CM | POA: Insufficient documentation

## 2018-05-23 DIAGNOSIS — R42 Dizziness and giddiness: Secondary | ICD-10-CM | POA: Insufficient documentation

## 2018-05-23 DIAGNOSIS — Z7982 Long term (current) use of aspirin: Secondary | ICD-10-CM | POA: Insufficient documentation

## 2018-05-23 DIAGNOSIS — C50411 Malignant neoplasm of upper-outer quadrant of right female breast: Secondary | ICD-10-CM

## 2018-05-23 DIAGNOSIS — I10 Essential (primary) hypertension: Secondary | ICD-10-CM

## 2018-05-23 LAB — CBC WITH DIFFERENTIAL/PLATELET
Abs Immature Granulocytes: 0.02 10*3/uL (ref 0.00–0.07)
BASOS ABS: 0.1 10*3/uL (ref 0.0–0.1)
Basophils Relative: 1 %
EOS ABS: 0.2 10*3/uL (ref 0.0–0.5)
EOS PCT: 4 %
HCT: 43.8 % (ref 36.0–46.0)
HEMOGLOBIN: 14.8 g/dL (ref 12.0–15.0)
Immature Granulocytes: 0 %
LYMPHS PCT: 37 %
Lymphs Abs: 2 10*3/uL (ref 0.7–4.0)
MCH: 30.5 pg (ref 26.0–34.0)
MCHC: 33.8 g/dL (ref 30.0–36.0)
MCV: 90.1 fL (ref 80.0–100.0)
Monocytes Absolute: 0.5 10*3/uL (ref 0.1–1.0)
Monocytes Relative: 9 %
Neutro Abs: 2.7 10*3/uL (ref 1.7–7.7)
Neutrophils Relative %: 49 %
PLATELETS: 264 10*3/uL (ref 150–400)
RBC: 4.86 MIL/uL (ref 3.87–5.11)
RDW: 12 % (ref 11.5–15.5)
WBC: 5.5 10*3/uL (ref 4.0–10.5)
nRBC: 0 % (ref 0.0–0.2)

## 2018-05-23 LAB — COMPREHENSIVE METABOLIC PANEL
ALBUMIN: 4.2 g/dL (ref 3.5–5.0)
ALT: 17 U/L (ref 0–44)
ANION GAP: 10 (ref 5–15)
AST: 24 U/L (ref 15–41)
Alkaline Phosphatase: 67 U/L (ref 38–126)
BUN: 12 mg/dL (ref 8–23)
CHLORIDE: 103 mmol/L (ref 98–111)
CO2: 25 mmol/L (ref 22–32)
Calcium: 10 mg/dL (ref 8.9–10.3)
Creatinine, Ser: 0.8 mg/dL (ref 0.44–1.00)
GFR calc non Af Amer: >60 mL/min (ref >60)
GLUCOSE: 141 mg/dL — AB (ref 70–99)
POTASSIUM: 4.1 mmol/L (ref 3.5–5.1)
Sodium: 138 mmol/L (ref 135–145)
Total Bilirubin: 0.5 mg/dL (ref 0.3–1.2)
Total Protein: 7.5 g/dL (ref 6.5–8.1)

## 2018-05-23 NOTE — Progress Notes (Signed)
[place] OFFICE PROGRESS NOTE  Patient Care Team: Jerrol Banana., MD as PCP - General (Family Medicine) Bary Castilla, Forest Gleason, MD (General Surgery) Cammie Sickle, MD as Consulting Physician (Internal Medicine) Lorelee Cover., MD as Consulting Physician (Ophthalmology) Noreene Filbert, MD as Referring Physician (Radiation Oncology)   SUMMARY OF HEMATOLOGIC/ONCOLOGIC HISTORY:  Oncology History   # FEB 2017- RIGHT BREAST  STAGE I [T1b N0 M0]. ER/PR ER positive 100% PR positive 95% HER 2 FISH negative;  INVASIVE MAMMARY CARCINOMA WITH MICROPAPILLARY FEATURES. S/p Lumpec [Dr.Byrnett] & RT; No Oncotype; Femara  # 2013- LEFT BREAST- STAGE II; ER/PR/ Her 2 NEG s/p Lumpec [Dr.Byrnett] & RT  # BMD- April  2017- wnl; genetic testing- declined [May 2017]  # Vertigo [uses rolling walker]     Carcinoma of upper-outer quadrant of right breast in female, estrogen receptor positive ([facility])     INTERVAL HISTORY:   A very pleasant 77 year old African-American female patient with above history of breast cancer most recently diagnosed in February 2017 is here for follow-up; patient currently on Femara.  Patient has mild arthritic pain in her joints.  Otherwise denies any worsening myalgias or nausea vomiting. Complains of intermittent constipation.  Denies any blood in stools.  Continues to have vertigo using rolling walker as needed.  Review of Systems  Constitutional: Negative for chills, diaphoresis, fever, malaise/fatigue and weight loss.  HENT: Negative for nosebleeds and sore throat.   Eyes: Negative for double vision.  Respiratory: Negative for cough, hemoptysis, sputum production, shortness of breath and wheezing.   Cardiovascular: Negative for chest pain, palpitations, orthopnea and leg swelling.  Gastrointestinal: Negative for abdominal pain, blood in stool, constipation, diarrhea, heartburn, melena, nausea and vomiting.  Genitourinary: Negative for dysuria,  frequency and urgency.  Musculoskeletal: Negative for back pain and joint pain.  Skin: Negative.  Negative for itching and rash.  Neurological: Negative for dizziness, tingling, focal weakness, weakness and headaches.  Endo/Heme/Allergies: Does not bruise/bleed easily.  Psychiatric/Behavioral: Negative for depression. The patient is not nervous/anxious and does not have insomnia.      PAST MEDICAL HISTORY :  Past Medical History:  Diagnosis Date  . Breast cancer ([place])   . Breast cancer of upper-inner quadrant of right female breast ([place]) 09/21/2015   T1bN0 " 38m; ER100%, PR 90%, HER-2/neu not overexpressed.  . Cystitis   . Diabetes mellitus without complication ([facility])    NO MEDS-DIET CONTROLLED  . GERD (gastroesophageal reflux disease)   . Hernia 2011  . Hyperlipidemia   . Hypertension   . Malignant neoplasm of upper-inner quadrant of [place] breast [place] 08/17/2011   Left, T2 (2.3 cm) N0, triple negative. Chemotherapy/post wide excision whole breast radiation.  . 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 Personal history of chemotherapy 2013  . Personal history of malignant neoplasm of breast   . Personal history of radiation therapy 2017    Rt  . Vertigo     PAST SURGICAL HISTORY :   Past Surgical History:  Procedure Laterality Date  . BREAST BIOPSY Left 07/18/2011   +  . BREAST BIOPSY Left 09/21/2015   neg  . BREAST BIOPSY Left 04/05/2016   neg  . BREAST EXCISIONAL BIOPSY Right 2017   +  . BREAST LUMPECTOMY Right 09/30/2015  . BREAST LUMPECTOMY Left 08/17/2011  . BREAST LUMPECTOMY WITH SENTINEL LYMPH NODE BIOPSY Right 09/30/2015   Procedure: BREAST LUMPECTOMY WITH SENTINEL LYMPH NODE BX;  Surgeon: JDellis Filbert  Amedeo Kinsman, MD;  Location: [facility] ORS;  Service: General;  Laterality: Right;  . BREAST SURGERY Left 2012   wide excision  . BREAST SURGERY Left November 2013   Core biopsy of the upper-outer quadrant showed fat necrosis.  .  CHOLECYSTECTOMY  2007  . COLONOSCOPY  2011   Dr Candace Cruise  . COLONOSCOPY WITH PROPOFOL N/A 02/25/2015   Procedure: COLONOSCOPY WITH PROPOFOL;  Surgeon: Hulen Luster, MD;  Location: [facility] ENDOSCOPY;  Service: Gastroenterology;  Laterality: N/A;  . DILATION AND CURETTAGE OF UTERUS  2004  . HERNIA REPAIR Right 69/45/0388   Umbilical/ventral hernia repaired with 6.4 cm Proceed ventral patch  . PORT-A-CATH REMOVAL    . PORTACATH PLACEMENT  2013  . 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  Marland Kitchen UPPER GI ENDOSCOPY  2011    FAMILY HISTORY :   Family History  Problem Relation Age of Onset  . Lymphoma Father   . Ovarian cancer Sister   . Colon cancer Brother   . Lymphoma Brother   . Cancer Other        stomach  . Cancer Other        stomach  . Breast cancer Neg Hx     SOCIAL HISTORY:   Social History   Tobacco Use  . Smoking status: Never Smoker  . Smokeless tobacco: Never Used  Substance Use Topics  . Alcohol use: No  . Drug use: No    ALLERGIES:  has No Known Allergies.  MEDICATIONS:  Current Outpatient Medications  Medication Sig Dispense Refill  . ACCU-CHEK FASTCLIX LANCETS MISC CHECK SUGAR ONCE DAILY  102 each 3  . acetaminophen (TYLENOL) 500 MG tablet Take 500 mg by mouth every 6 (six) hours as needed for mild pain or headache.     Marland Kitchen acyclovir (ZOVIRAX) 200 MG capsule TAKE 1 CAPSULE EVERY DAY 90 capsule 3  . Alcohol Swabs (B-D SINGLE USE SWABS REGULAR) PADS CHECK SUGAR ONCE DAILY 100 each 3  . aspirin EC 81 MG tablet Take 81 mg by mouth daily.    Marland Kitchen atorvastatin (LIPITOR) 10 MG tablet TAKE 1 TABLET EVERY EVENING 90 tablet 3  . bimatoprost (LUMIGAN) 0.01 % SOLN Place 1 drop into both eyes at bedtime.     . Blood Glucose Calibration (ACCU-CHEK SMARTVIEW CONTROL) LIQD     . Calcium Carb-Cholecalciferol (CALCIUM 600+D3) 600-800 MG-UNIT TABS Take 1 tablet by mouth 2 (two)  times daily.    . cholecalciferol (VITAMIN D) 1000 units tablet Take 1,000 Units by mouth daily.    . cyanocobalamin 500 MCG tablet Take 500 mcg by mouth daily.    Marland Kitchen escitalopram (LEXAPRO) 10 MG tablet Take 1 tablet (10 mg total) by mouth daily. (Patient taking differently: Take 10 mg by mouth daily. ) 30 tablet 12  . escitalopram (LEXAPRO) 20 MG tablet TAKE 1 TABLET EVERY DAY (Patient taking differently: TAKE 1 TABLET EVERY DAY (alternates between this and 2 10 mg tablets (total 56m) daily)) 90 tablet 3  . gabapentin (NEURONTIN) 300 MG capsule Take 2 capsules (600 mg total) by mouth at bedtime. 180 capsule 3  . glucose blood (ACCU-CHEK SMARTVIEW) test strip 1 each by Other route daily. Use as instructed 100 each 3  . glucose blood (ACCU-CHEK SMARTVIEW) test strip Use as instructed 100 each 12  . glucose blood (ACCU-CHEK SMARTVIEW) test strip CHECK  SUGAR ONE TIME DAILY 100 each  3  . latanoprost (XALATAN) 0.005 % ophthalmic solution Place 1 drop into both eyes at bedtime.     Marland Kitchen letrozole (FEMARA) 2.5 MG tablet Take 1 tablet (2.5 mg total) by mouth daily. 90 tablet 3  . meclizine (ANTIVERT) 25 MG tablet Take 0.5 tablets (12.5 mg total) by mouth 3 (three) times daily as needed for dizziness. 15 tablet 0  . meloxicam (MOBIC) 15 MG tablet TAKE 1 TABLET EVERY DAY 90 tablet 3  . metFORMIN (GLUCOPHAGE) 500 MG tablet Take 1 tablet (500 mg total) by mouth 2 (two) times daily with a meal. 180 tablet 3  . metoprolol tartrate (LOPRESSOR) 25 MG tablet TAKE 1/2 TABLET TWICE DAILY 90 tablet 3  . Multiple Vitamin (MULTIVITAMIN WITH MINERALS) TABS tablet Take 1 tablet by mouth daily.    Marland Kitchen omega-3 acid ethyl esters (LOVAZA) 1 g capsule Take 3 g by mouth daily.     . pantoprazole (PROTONIX) 40 MG tablet Take 1 tablet (40 mg total) by mouth 2 (two) times daily. 180 tablet 3  . pyridoxine (B-6) 100 MG tablet Take 100 mg by mouth daily.    . vitamin C (ASCORBIC ACID) 500 MG tablet Take 1,000 mg by mouth daily.    .  vitamin E 400 UNIT capsule Take 400 Units by mouth daily.     No current facility-administered medications for this visit.     PHYSICAL EXAMINATION: ECOG PERFORMANCE STATUS: 0 - Asymptomatic  BP 125/82 (BP Location: Left Arm, Patient Position: Sitting)   Pulse 78   Temp (!) 97.3 F (36.3 C) (Tympanic)   Resp 16   Wt 206 lb 12.8 oz (93.8 kg)   BMI 32.39 kg/m   Filed Weights   05/23/18 1438  Weight: 206 lb 12.8 oz (93.8 kg)   Physical Exam  Constitutional: She is oriented to person, place, and time and well-developed, well-nourished, and in no distress.  HENT:  Head: Normocephalic and atraumatic.  Mouth/Throat: Oropharynx is clear and moist. No oropharyngeal exudate.  Eyes: Pupils are equal, round, and reactive to light.  Neck: Normal range of motion. Neck supple.  Cardiovascular: Normal rate and regular rhythm.  Pulmonary/Chest: No respiratory distress. She has no wheezes.  Abdominal: Soft. Bowel sounds are normal. She exhibits no distension and no mass. There is no tenderness. There is no rebound and no guarding.  Musculoskeletal: Normal range of motion. She exhibits no edema or tenderness.  Neurological: She is alert and oriented to person, place, and time.  Skin: Skin is warm.  Psychiatric: Affect normal.     LABORATORY DATA:  I have reviewed the data as listed    Component Value Date/Time   NA 138 05/23/2018 1401   NA 143 01/14/2018 1014   NA 140 12/23/2012 0949   K 4.1 05/23/2018 1401   K 4.7 12/23/2012 0949   CL 103 05/23/2018 1401   CL 101 12/23/2012 0949   CO2 25 05/23/2018 1401   CO2 30 12/23/2012 0949   GLUCOSE 141 (H) 05/23/2018 1401   GLUCOSE 168 (H) 12/23/2012 0949   BUN 12 05/23/2018 1401   BUN 9 01/14/2018 1014   BUN 9 12/23/2012 0949   CREATININE 0.80 05/23/2018 1401   CREATININE 0.92 09/04/2014 0916   CALCIUM 10.0 05/23/2018 1401   CALCIUM 9.8 12/23/2012 0949   PROT 7.5 05/23/2018 1401   PROT 7.0 01/14/2018 1014   PROT 7.4 09/04/2014 0916    ALBUMIN 4.2 05/23/2018 1401   ALBUMIN 4.4 01/14/2018 1014   ALBUMIN 3.7  09/04/2014 0916   AST 24 05/23/2018 1401   AST 21 09/04/2014 0916   ALT 17 05/23/2018 1401   ALT 34 09/04/2014 0916   ALKPHOS 67 05/23/2018 1401   ALKPHOS 85 09/04/2014 0916   BILITOT 0.5 05/23/2018 1401   BILITOT 0.4 01/14/2018 1014   BILITOT 0.4 09/04/2014 0916   GFRNONAA >60 05/23/2018 1401   GFRNONAA >60 09/04/2014 0916   GFRNONAA >60 02/18/2014 1048   GFRAA >60 05/23/2018 1401   GFRAA >60 09/04/2014 0916   GFRAA >60 02/18/2014 1048    No results found for: SPEP, UPEP  Lab Results  Component Value Date   WBC 5.5 05/23/2018   NEUTROABS 2.7 05/23/2018   HGB 14.8 05/23/2018   HCT 43.8 05/23/2018   MCV 90.1 05/23/2018   PLT 264 05/23/2018      Chemistry      Component Value Date/Time   NA 138 05/23/2018 1401   NA 143 01/14/2018 1014   NA 140 12/23/2012 0949   K 4.1 05/23/2018 1401   K 4.7 12/23/2012 0949   CL 103 05/23/2018 1401   CL 101 12/23/2012 0949   CO2 25 05/23/2018 1401   CO2 30 12/23/2012 0949   BUN 12 05/23/2018 1401   BUN 9 01/14/2018 1014   BUN 9 12/23/2012 0949   CREATININE 0.80 05/23/2018 1401   CREATININE 0.92 09/04/2014 0916      Component Value Date/Time   CALCIUM 10.0 05/23/2018 1401   CALCIUM 9.8 12/23/2012 0949   ALKPHOS 67 05/23/2018 1401   ALKPHOS 85 09/04/2014 0916   AST 24 05/23/2018 1401   AST 21 09/04/2014 0916   ALT 17 05/23/2018 1401   ALT 34 09/04/2014 0916   BILITOT 0.5 05/23/2018 1401   BILITOT 0.4 01/14/2018 1014   BILITOT 0.4 09/04/2014 0916     IMPRESSION: 1. Diffuse advanced disc degeneration and bulging without thoracic cord impingement. 2. Disc degeneration and facet hypertrophy causes notable high-grade foraminal stenosis on the left at T10-11 and T11-12. 3. On the symptomatic right side there is moderate foraminal stenosis at T11-12. 4. Right paracentral disc protrusions at T10-11 and T12-L1, which could be a source of root  impingement. 5. L1-2 advanced disc degeneration with moderate canal and right foraminal stenosis.   Electronically Signed   By: Monte Fantasia M.D.   On: 09/07/2017 08:49   ASSESSMENT & PLAN:   Carcinoma of upper-outer quadrant of right breast in female, estrogen receptor positive ([place]) # 2017 right breast cancer stage I ER/PR positive HER-2/neu negative status post lumpectomy. No chemotherapy. Status post adjuvant radiation.  Stable.  Currently on Femara.  Tolerating well without any side effects.  # BMD: May 2019 normal; continue calcium and vitamin D.  Stable.  # Right chest wall pain secondary to T10-T12 impingement:improved.   # Follow up in 6 months/labs-cbc/cmp/ca-27-29-/MD- dr.B        Cammie Sickle, MD 05/27/2018 6:28 PM

## 2018-05-23 NOTE — Assessment & Plan Note (Addendum)
#  2017 right breast cancer stage I ER/PR positive HER-2/neu negative status post lumpectomy. No chemotherapy. Status post adjuvant radiation.  Stable.  Currently on Femara.  Tolerating well without any side effects.  # BMD: May 2019 normal; continue calcium and vitamin D.  Stable.  # Right chest wall pain secondary to T10-T12 impingement:improved.   # Follow up in 6 months/labs-cbc/cmp/ca-27-29-/MD- dr.B

## 2018-05-24 LAB — CANCER ANTIGEN 27.29: CA 27.29: 17.6 U/mL (ref 0.0–38.6)

## 2018-05-27 DIAGNOSIS — H40153 Residual stage of open-angle glaucoma, bilateral: Secondary | ICD-10-CM | POA: Diagnosis not present

## 2018-05-28 ENCOUNTER — Other Ambulatory Visit: Payer: Self-pay | Admitting: Family Medicine

## 2018-05-28 DIAGNOSIS — E119 Type 2 diabetes mellitus without complications: Secondary | ICD-10-CM

## 2018-05-28 NOTE — Telephone Encounter (Signed)
[facility] faxed refill request for the following medications:  letrozole (FEMARA) 2.5 MG tablet  metFORMIN (GLUCOPHAGE) 500 MG tablet   Accu-chek aviva plus test strp  Accu-chek softclix lancets  Please advise.

## 2018-05-30 MED ORDER — ACCU-CHEK SOFT TOUCH LANCETS MISC: 12 refills | Status: DC

## 2018-05-30 MED ORDER — LETROZOLE 2.5 MG PO TABS: 2.5000 mg | ORAL_TABLET | Freq: Every day | ORAL | 3 refills | Status: DC

## 2018-05-30 MED ORDER — METFORMIN HCL 500 MG PO TABS: 500.0000 mg | ORAL_TABLET | Freq: Two times a day (BID) | ORAL | 3 refills | Status: DC

## 2018-05-30 MED ORDER — GLUCOSE BLOOD VI STRP: ORAL_STRIP | 12 refills | Status: DC

## 2018-06-12 ENCOUNTER — Other Ambulatory Visit: Payer: Self-pay

## 2018-06-12 ENCOUNTER — Ambulatory Visit
Admission: RE | Admit: 2018-06-12 | Discharge: 2018-06-12 | Disposition: A | Discharge status: Home / Self Care | Payer: Medicare HMO | Source: Ambulatory Visit | Attending: Radiation Oncology | Admitting: Radiation Oncology

## 2018-06-12 ENCOUNTER — Encounter: Payer: Self-pay | Admitting: Radiation Oncology

## 2018-06-12 DIAGNOSIS — Z79811 Long term (current) use of aromatase inhibitors: Secondary | ICD-10-CM | POA: Insufficient documentation

## 2018-06-12 DIAGNOSIS — Z923 Personal history of irradiation: Secondary | ICD-10-CM | POA: Insufficient documentation

## 2018-06-12 DIAGNOSIS — C50211 Malignant neoplasm of upper-inner quadrant of right female breast: Secondary | ICD-10-CM | POA: Diagnosis not present

## 2018-06-12 DIAGNOSIS — Z170 Estrogen receptor positive status [ER+]: Secondary | ICD-10-CM | POA: Insufficient documentation

## 2018-06-12 NOTE — Progress Notes (Signed)
Radiation Oncology Follow up Note  Name: RAPHAELA CANNADAY   Date:   06/12/2018 MRN:  [id number] DOB: [birth year]    This 77 y.o. female presents to the clinic today for 2-1/2 year follow-up status post accelerated partial breast radiation to her right breast for stage I invasive mammary carcinoma ER/PR positive.  REFERRING PROVIDER: Jerrol Banana.,*  HPI: patient is a 77 year old female now out 2-1/2 years having completed accelerated partial breast radiation to her right breast for stage I ER/PR positive invasive mammary carcinoma seen today in routine follow-up she is doing well. She specifically denies breast tenderness cough or bone pain..she is currently on Femara tolerating that well without side effect.patient is also status post lumpectomy and radiation to her left breast back in 2026for stage II for triple negative disease.her last mammogram was back in May 2019. She has had imaging showing possible fat necrosis which was confirmed with cytology. COMPLICATIONS OF TREATMENT: none  FOLLOW UP COMPLIANCE: keeps appointments   PHYSICAL EXAM:  BP (!) (P) 138/92 (BP Location: Left Arm, Patient Position: Sitting)   Pulse (P) 82   Temp (!) (P) 96.5 F (35.8 C) (Tympanic)   Resp (P) 16   Wt (P) 206 lb 7.4 oz (93.6 kg)   BMI (P) 32.34 kg/m  Patient is patent is status post bilateral wide local excisions. Breasts are firm and scarred although note discrete nodularity or masses noted in either breast no axillary or supraclavicular adenopathies identified.Well-developed well-nourished patient in NAD. HEENT reveals PERLA, EOMI, discs not visualized.  Oral cavity is clear. No oral mucosal lesions are identified. Neck is clear without evidence of cervical or supraclavicular adenopathy. Lungs are clear to A&P. Cardiac examination is essentially unremarkable with regular rate and rhythm without murmur rub or thrill. Abdomen is benign with no organomegaly or masses noted. Motor sensory and DTR  levels are equal and symmetric in the upper and lower extremities. Cranial nerves II through XII are grossly intact. Proprioception is intact. No peripheral adenopathy or edema is identified. No motor or sensory levels are noted. Crude visual fields are within normal range.  RADIOLOGY RESULTS: no current films are available for my review  PLAN: at the present time patient is doing well with no evidence of disease now 2-1/2 years out from accelerated partial breast radiation to her right breast. I'm please were overall progress. I've asked to see her back in 1 year for follow-up. She continues close follow-up care with Dr. Tollie Pizza and Dr. Jacinto Reap. Patient is to call with any concerns.  I would like to take this opportunity to thank you for allowing me to participate in the care of your patient.Noreene Filbert, MD

## 2018-07-17 ENCOUNTER — Other Ambulatory Visit: Payer: Self-pay | Admitting: Family Medicine

## 2018-07-17 DIAGNOSIS — I10 Essential (primary) hypertension: Secondary | ICD-10-CM

## 2018-07-17 DIAGNOSIS — M545 Low back pain, unspecified: Secondary | ICD-10-CM

## 2018-07-17 DIAGNOSIS — E7800 Pure hypercholesterolemia, unspecified: Secondary | ICD-10-CM

## 2018-07-23 ENCOUNTER — Other Ambulatory Visit: Payer: Self-pay | Admitting: Family Medicine

## 2018-07-23 NOTE — Telephone Encounter (Signed)
Patient called and states that [facility] did not receive the prescription for escitalopram (LEXAPRO) 20 MG tablet.  Can you resend.

## 2018-07-24 MED ORDER — ESCITALOPRAM OXALATE 20 MG PO TABS: ORAL_TABLET | ORAL | 3 refills | Status: DC

## 2018-07-24 NOTE — Telephone Encounter (Signed)
Done

## 2018-07-29 ENCOUNTER — Other Ambulatory Visit: Payer: Self-pay | Admitting: Family Medicine

## 2018-08-08 ENCOUNTER — Other Ambulatory Visit: Payer: Self-pay | Admitting: Family Medicine

## 2018-08-08 DIAGNOSIS — K2970 Gastritis, unspecified, without bleeding: Secondary | ICD-10-CM

## 2018-08-12 ENCOUNTER — Other Ambulatory Visit: Payer: Self-pay | Admitting: Family Medicine

## 2018-08-12 DIAGNOSIS — E119 Type 2 diabetes mellitus without complications: Secondary | ICD-10-CM

## 2018-08-12 MED ORDER — METFORMIN HCL 500 MG PO TABS: 500.0000 mg | ORAL_TABLET | Freq: Two times a day (BID) | ORAL | 3 refills | Status: DC

## 2018-08-12 NOTE — Telephone Encounter (Signed)
Pt has only 4 days left on: metFORMIN (GLUCOPHAGE) 500 MG tablet  Please fill with: [facility], [place] [phone] (Phone) [phone] (Fax)   Thanks, [facility]

## 2018-08-12 NOTE — Telephone Encounter (Signed)
Please review. Thanks!  

## 2018-08-14 ENCOUNTER — Telehealth: Payer: Self-pay | Admitting: Family Medicine

## 2018-08-14 DIAGNOSIS — E119 Type 2 diabetes mellitus without complications: Secondary | ICD-10-CM

## 2018-08-14 MED ORDER — METFORMIN HCL 500 MG PO TABS: 500.0000 mg | ORAL_TABLET | Freq: Two times a day (BID) | ORAL | 0 refills | Status: DC

## 2018-08-14 NOTE — Telephone Encounter (Signed)
[employer] cannot send pt's metFORMIN (GLUCOPHAGE) 500 MG tablet until Feb. 8th.  Pt only has a 2 day supply left.  Pt had issues with [facility] pharmacy in the past.  Pt wanting to discuss option to get an emergency supply.  Please advise.  Thanks, [employer]

## 2018-08-14 NOTE — Telephone Encounter (Signed)
Patient was advised that the emergency supply of Metformin was send in to [facility].

## 2018-09-10 ENCOUNTER — Other Ambulatory Visit: Payer: Self-pay | Admitting: Family Medicine

## 2018-09-10 DIAGNOSIS — E119 Type 2 diabetes mellitus without complications: Secondary | ICD-10-CM

## 2018-09-10 NOTE — Telephone Encounter (Signed)
Pt called wanting to know if she will be getting her Metformin from [facility] this month.  She wants it sent to [facility].  Last month it was sent to [facility].  Pt's call back is [phone]  Thanks teri

## 2018-09-11 MED ORDER — METFORMIN HCL 500 MG PO TABS: 500.0000 mg | ORAL_TABLET | Freq: Two times a day (BID) | ORAL | 3 refills | Status: DC

## 2018-09-13 ENCOUNTER — Other Ambulatory Visit: Payer: Self-pay | Admitting: Family Medicine

## 2018-09-13 DIAGNOSIS — E119 Type 2 diabetes mellitus without complications: Secondary | ICD-10-CM

## 2018-09-16 ENCOUNTER — Other Ambulatory Visit: Payer: Self-pay | Admitting: Family Medicine

## 2018-09-16 DIAGNOSIS — I10 Essential (primary) hypertension: Secondary | ICD-10-CM

## 2018-09-18 ENCOUNTER — Other Ambulatory Visit: Payer: Self-pay | Admitting: Family Medicine

## 2018-09-18 DIAGNOSIS — M545 Low back pain, unspecified: Secondary | ICD-10-CM

## 2018-09-18 DIAGNOSIS — E7800 Pure hypercholesterolemia, unspecified: Secondary | ICD-10-CM

## 2018-09-19 NOTE — Telephone Encounter (Signed)
Pharmacy requesting refills. Thanks!  

## 2018-10-17 ENCOUNTER — Ambulatory Visit (INDEPENDENT_AMBULATORY_CARE_PROVIDER_SITE_OTHER): Payer: Medicare HMO | Admitting: Family Medicine

## 2018-10-17 VITALS — BP 122/74 | HR 79 | Temp 98.2°F | Resp 16 | Wt 207.0 lb

## 2018-10-17 DIAGNOSIS — K219 Gastro-esophageal reflux disease without esophagitis: Secondary | ICD-10-CM

## 2018-10-17 DIAGNOSIS — E7800 Pure hypercholesterolemia, unspecified: Secondary | ICD-10-CM | POA: Diagnosis not present

## 2018-10-17 DIAGNOSIS — F4322 Adjustment disorder with anxiety: Secondary | ICD-10-CM | POA: Diagnosis not present

## 2018-10-17 DIAGNOSIS — C50919 Malignant neoplasm of unspecified site of unspecified female breast: Secondary | ICD-10-CM | POA: Diagnosis not present

## 2018-10-17 DIAGNOSIS — I10 Essential (primary) hypertension: Secondary | ICD-10-CM

## 2018-10-17 DIAGNOSIS — E119 Type 2 diabetes mellitus without complications: Secondary | ICD-10-CM | POA: Diagnosis not present

## 2018-10-17 NOTE — Progress Notes (Signed)
Leah Schaefer  MRN: [id number] DOB: 09-01-1940  Subjective:  HPI   The patient is a 8 y ear old female who presents for follow up of her chronic health.  She was last seen on 04/08/18.  Her A1C at that time was 7.2.  Overall she is feeling well. She  has been stressed because her husband has been ill and her daughter is going through a divorce.  She did have a grandchild in an auto accident also.  Patient Active Problem List   Diagnosis Date Noted  . 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 Umbilical pain 90/38/3338  . Breast mass, left 04/07/2016  . Controlled type 2 diabetes mellitus without complication ([place]) 32/91/9166  . Allergic rhinitis 03/30/2015  . Anxiety 03/30/2015  . Carotid arterial disease ([place]) 03/30/2015  . Diabetes mellitus, type 2 ([place]) 03/30/2015  . Essential (primary) hypertension 03/30/2015  . Hypercholesteremia 03/30/2015  . Left leg pain 03/30/2015  . LBP (low back pain) 03/30/2015  . Neuropathic pain 03/30/2015  . Burning or prickling sensation 03/30/2015  . Neuralgia neuritis, sciatic nerve 03/30/2015  . Skin cyst 11/24/2014  . Carcinoma of upper-outer quadrant of right breast in female, estrogen receptor positive ([place]) 08/17/2011  . Allergic reaction 11/29/2009  . Adaptation reaction 04/30/2009  . Acid reflux 04/30/2009  . Stricture and stenosis of cervix 01/28/2009    Past Medical History:  Diagnosis Date  . Breast cancer ([place])   . Breast cancer of upper-inner quadrant of right female breast ([place]) 09/21/2015   T1bN0 " 88m; ER100%, PR 90%, HER-2/neu not overexpressed.  . Cystitis   . Diabetes mellitus without complication (H[facility])    NO MEDS-DIET CONTROLLED  . GERD (gastroesophageal reflux disease)   . Hernia 2011  . Hyperlipidemia   . Hypertension   . Malignant neoplasm of upper-inner quadrant of female breast ([place] 08/17/2011   Left, T2 (2.3 cm) N0, triple negative. Chemotherapy/post wide excision whole breast radiation.  . 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 Personal history of chemotherapy 2013  . Personal history of malignant neoplasm of breast   . Personal history of radiation therapy 2017    Rt  . Vertigo     Social History   Socioeconomic History  . Marital status: Married    Spouse name: Not on file  . Number of children: 2  . Years of education: H/S  . Highest education level: 12th grade  Occupational History  . Occupation: Retired  SScientific laboratory technician . Financial resource strain: Not hard at all  . Food insecurity:    Worry: Never true    Inability: Never true  . Transportation needs:    Medical: No    Non-medical: No  Tobacco Use  . Smoking status: Never Smoker  . Smokeless tobacco: Never Used  Substance and Sexual Activity  . Alcohol use: No  . Drug use: No  . Sexual activity: Not on file  Lifestyle  . Physical activity:    Days per week: Not on file    Minutes per session: Not on file  . Stress: Only a little  Relationships  . Social connections:    Talks on phone: Not on file    Gets together: Not on file    Attends religious service: Not on file    Active member of club or organization: Not on file    Attends meetings of clubs or organizations: Not on file    Relationship  status: Not on file  . Intimate partner violence:    Fear of current or ex partner: Not on file    Emotionally abused: Not on file    Physically abused: Not on file    Forced sexual activity: Not on file  Other Topics Concern  . Not on file  Social History Narrative  . Not on file    Outpatient Encounter Medications as of 10/17/2018  Medication Sig Note  . ACCU-CHEK FASTCLIX LANCETS MISC CHECK SUGAR ONCE DAILY    . acetaminophen (TYLENOL) 500 MG tablet Take 500 mg by mouth every 6 (six) hours as needed for mild pain or headache.    Marland Kitchen acyclovir (ZOVIRAX) 200 MG capsule TAKE 1 CAPSULE EVERY DAY   . Alcohol Swabs (B-D SINGLE USE SWABS REGULAR) PADS CHECK SUGAR ONCE DAILY   .  aspirin EC 81 MG tablet Take 81 mg by mouth daily.   Marland Kitchen atorvastatin (LIPITOR) 10 MG tablet TAKE 1 TABLET EVERY EVENING   . bimatoprost (LUMIGAN) 0.01 % SOLN Place 1 drop into both eyes at bedtime.    . Blood Glucose Calibration (ACCU-CHEK SMARTVIEW CONTROL) LIQD  01/25/2016: Received from: External Pharmacy  . Blood Glucose Monitoring Suppl (ACCU-CHEK AVIVA PLUS) w/Device KIT USE AS DIRECTED   . Calcium Carb-Cholecalciferol (CALCIUM 600+D3) 600-800 MG-UNIT TABS Take 1 tablet by mouth 2 (two) times daily.   . cholecalciferol (VITAMIN D) 1000 units tablet Take 1,000 Units by mouth daily.   . cyanocobalamin 500 MCG tablet Take 500 mcg by mouth daily.   Marland Kitchen escitalopram (LEXAPRO) 10 MG tablet Take 1 tablet (10 mg total) by mouth daily. (Patient taking differently: Take 10 mg by mouth daily. )   . escitalopram (LEXAPRO) 20 MG tablet TAKE 1 TABLET EVERY DAY (alternates between this and 2 10 mg tablets (total 73m) daily)   . gabapentin (NEURONTIN) 300 MG capsule Take 2 capsules (600 mg total) by mouth at bedtime.   .Marland Kitchenglucose blood (ACCU-CHEK AVIVA) test strip Use as instructed   . glucose blood (ACCU-CHEK SMARTVIEW) test strip 1 each by Other route daily. Use as instructed   . glucose blood (ACCU-CHEK SMARTVIEW) test strip Use as instructed   . glucose blood (ACCU-CHEK SMARTVIEW) test strip CHECK  SUGAR ONE TIME DAILY   . Lancets (ACCU-CHEK SOFT TOUCH) lancets Use as instructed   . latanoprost (XALATAN) 0.005 % ophthalmic solution Place 1 drop into both eyes at bedtime.    .Marland Kitchenletrozole (FEMARA) 2.5 MG tablet Take 1 tablet (2.5 mg total) by mouth daily.   . meclizine (ANTIVERT) 25 MG tablet Take 0.5 tablets (12.5 mg total) by mouth 3 (three) times daily as needed for dizziness.   . meloxicam (MOBIC) 15 MG tablet TAKE 1 TABLET EVERY DAY   . metFORMIN (GLUCOPHAGE) 500 MG tablet TAKE 1 TABLET BY MOUTH TWICE DAILY WITH MEALS   . metoprolol tartrate (LOPRESSOR) 25 MG tablet TAKE 1/2 TABLET TWICE DAILY   .  Multiple Vitamin (MULTIVITAMIN WITH MINERALS) TABS tablet Take 1 tablet by mouth daily.   .Marland Kitchenomega-3 acid ethyl esters (LOVAZA) 1 g capsule Take 3 g by mouth daily.    . pantoprazole (PROTONIX) 40 MG tablet TAKE 1 TABLET TWICE DAILY   . pyridoxine (B-6) 100 MG tablet Take 100 mg by mouth daily.   . vitamin C (ASCORBIC ACID) 500 MG tablet Take 1,000 mg by mouth daily.   . vitamin E 400 UNIT capsule Take 400 Units by mouth daily.    No facility-administered  encounter medications on file as of 10/17/2018.     No Known Allergies  Review of Systems  Constitutional: Positive for malaise/fatigue. Negative for fever.  HENT: Negative.   Eyes: Negative.   Respiratory: Negative for cough, shortness of breath and wheezing.   Cardiovascular: Negative for chest pain, palpitations, orthopnea, claudication and leg swelling.  Gastrointestinal: Negative.   Musculoskeletal: Positive for joint pain.  Skin: Negative.   Neurological: Negative.   Endo/Heme/Allergies: Negative.   Psychiatric/Behavioral: The patient is nervous/anxious.     Objective:  BP 122/74 (BP Location: Right Arm, Patient Position: Sitting, Cuff Size: Normal)   Pulse 79   Temp 98.2 F (36.8 C) (Oral)   Resp 16   Wt 207 lb (93.9 kg)   SpO2 98%   BMI 32.42 kg/m   Physical Exam  Constitutional: She is oriented to person, place, and time and well-developed, well-nourished, and in no distress.  HENT:  Head: Normocephalic and atraumatic.  Eyes: Conjunctivae are normal. No scleral icterus.  Neck: No thyromegaly present.  Cardiovascular: Normal rate, regular rhythm and normal heart sounds.  Pulmonary/Chest: Effort normal and breath sounds normal.  Abdominal: Soft.  Neurological: She is alert and oriented to person, place, and time.  Normal monofilament foot exam.  Skin: Skin is warm and dry.  Psychiatric: Mood, memory, affect and judgment normal.    Assessment and Plan :  1. Essential (primary) hypertension  - CBC with  Differential/Platelet - Comprehensive metabolic panel - TSH  2. Controlled type 2 diabetes mellitus without complication, without long-term current use of insulin ([place]) Presently controlled.  Diet and exercise stressed. - Hemoglobin A1c  3. Hypercholesteremia On atorvastatin - Lipid Panel With LDL/HDL Ratio  4. Gastroesophageal reflux disease without esophagitis   5. Malignant neoplasm of female breast, unspecified estrogen receptor status, unspecified laterality, unspecified site of breast ([place])   6. Adjustment disorder with anxious mood Family situations are are difficult presently.  She is coping fairly well.  May need counseling in the future.  Consider CCM referral for counseling  I have done the exam and reviewed the chart and it is accurate to the best of my knowledge. Development worker, community has been used and  any errors in dictation or transcription are unintentional. [name] M.D. [facility]

## 2018-10-21 DIAGNOSIS — I10 Essential (primary) hypertension: Secondary | ICD-10-CM | POA: Diagnosis not present

## 2018-10-21 DIAGNOSIS — E7800 Pure hypercholesterolemia, unspecified: Secondary | ICD-10-CM | POA: Diagnosis not present

## 2018-10-21 DIAGNOSIS — E119 Type 2 diabetes mellitus without complications: Secondary | ICD-10-CM | POA: Diagnosis not present

## 2018-10-22 LAB — CBC WITH DIFFERENTIAL/PLATELET
Basophils Absolute: 0.1 10*3/uL (ref 0.0–0.2)
Basos: 1 %
EOS (ABSOLUTE): 0.2 10*3/uL (ref 0.0–0.4)
Eos: 4 %
HEMOGLOBIN: 15 g/dL (ref 11.1–15.9)
Hematocrit: 43.7 % (ref 34.0–46.6)
Immature Grans (Abs): 0 10*3/uL (ref 0.0–0.1)
Immature Granulocytes: 0 %
Lymphocytes Absolute: 1.7 10*3/uL (ref 0.7–3.1)
Lymphs: 34 %
MCH: 30.6 pg (ref 26.6–33.0)
MCHC: 34.3 g/dL (ref 31.5–35.7)
MCV: 89 fL (ref 79–97)
MONOCYTES: 8 %
Monocytes Absolute: 0.4 10*3/uL (ref 0.1–0.9)
Neutrophils Absolute: 2.6 10*3/uL (ref 1.4–7.0)
Neutrophils: 53 %
Platelets: 263 10*3/uL (ref 150–450)
RBC: 4.9 x10E6/uL (ref 3.77–5.28)
RDW: 12.4 % (ref 11.7–15.4)
WBC: 5 10*3/uL (ref 3.4–10.8)

## 2018-10-22 LAB — COMPREHENSIVE METABOLIC PANEL
ALBUMIN: 4.3 g/dL (ref 3.7–4.7)
ALT: 17 IU/L (ref 0–32)
AST: 14 IU/L (ref 0–40)
Albumin/Globulin Ratio: 1.4 (ref 1.2–2.2)
Alkaline Phosphatase: 73 IU/L (ref 39–117)
BUN/Creatinine Ratio: 13 (ref 12–28)
BUN: 11 mg/dL (ref 8–27)
Bilirubin Total: 0.3 mg/dL (ref 0.0–1.2)
CO2: 24 mmol/L (ref 20–29)
Calcium: 10.1 mg/dL (ref 8.7–10.3)
Chloride: 101 mmol/L (ref 96–106)
Creatinine, Ser: 0.85 mg/dL (ref 0.57–1.00)
GFR calc Af Amer: 77 mL/min/{1.73_m2} (ref >59)
GFR calc non Af Amer: 67 mL/min/{1.73_m2} (ref >59)
Globulin, Total: 3 g/dL (ref 1.5–4.5)
Glucose: 153 mg/dL — ABNORMAL HIGH (ref 65–99)
Potassium: 4.5 mmol/L (ref 3.5–5.2)
Sodium: 144 mmol/L (ref 134–144)
Total Protein: 7.3 g/dL (ref 6.0–8.5)

## 2018-10-22 LAB — LIPID PANEL WITH LDL/HDL RATIO
Cholesterol, Total: 136 mg/dL (ref 100–199)
HDL: 34 mg/dL — AB (ref >39)
LDL Calculated: 84 mg/dL (ref 0–99)
LDl/HDL Ratio: 2.5 ratio (ref 0.0–3.2)
Triglycerides: 89 mg/dL (ref 0–149)
VLDL Cholesterol Cal: 18 mg/dL (ref 5–40)

## 2018-10-22 LAB — TSH: TSH: 1.7 u[IU]/mL (ref 0.450–4.500)

## 2018-10-22 LAB — HEMOGLOBIN A1C
Est. average glucose Bld gHb Est-mCnc: 160 mg/dL
Hgb A1c MFr Bld: 7.2 % — ABNORMAL HIGH (ref 4.8–5.6)

## 2018-11-06 ENCOUNTER — Other Ambulatory Visit: Payer: Self-pay

## 2018-11-06 ENCOUNTER — Observation Stay
Admission: EM | Admit: 2018-11-06 | Discharge: 2018-11-07 | Disposition: A | Discharge status: Home / Self Care | Payer: Medicare HMO | Attending: Internal Medicine | Admitting: Internal Medicine

## 2018-11-06 ENCOUNTER — Emergency Department: Payer: Medicare HMO

## 2018-11-06 ENCOUNTER — Encounter: Payer: Self-pay | Admitting: Emergency Medicine

## 2018-11-06 DIAGNOSIS — Z7982 Long term (current) use of aspirin: Secondary | ICD-10-CM | POA: Diagnosis not present

## 2018-11-06 DIAGNOSIS — F419 Anxiety disorder, unspecified: Secondary | ICD-10-CM | POA: Diagnosis not present

## 2018-11-06 DIAGNOSIS — C50411 Malignant neoplasm of upper-outer quadrant of right female breast: Secondary | ICD-10-CM | POA: Diagnosis not present

## 2018-11-06 DIAGNOSIS — R531 Weakness: Secondary | ICD-10-CM | POA: Insufficient documentation

## 2018-11-06 DIAGNOSIS — Z170 Estrogen receptor positive status [ER+]: Secondary | ICD-10-CM | POA: Insufficient documentation

## 2018-11-06 DIAGNOSIS — Z791 Long term (current) use of non-steroidal anti-inflammatories (NSAID): Secondary | ICD-10-CM | POA: Diagnosis not present

## 2018-11-06 DIAGNOSIS — E7800 Pure hypercholesterolemia, unspecified: Secondary | ICD-10-CM | POA: Diagnosis not present

## 2018-11-06 DIAGNOSIS — Z923 Personal history of irradiation: Secondary | ICD-10-CM | POA: Insufficient documentation

## 2018-11-06 DIAGNOSIS — E118 Type 2 diabetes mellitus with unspecified complications: Secondary | ICD-10-CM

## 2018-11-06 DIAGNOSIS — Z79899 Other long term (current) drug therapy: Secondary | ICD-10-CM | POA: Insufficient documentation

## 2018-11-06 DIAGNOSIS — Z7984 Long term (current) use of oral hypoglycemic drugs: Secondary | ICD-10-CM | POA: Insufficient documentation

## 2018-11-06 DIAGNOSIS — G8192 Hemiplegia, unspecified affecting left dominant side: Secondary | ICD-10-CM | POA: Insufficient documentation

## 2018-11-06 DIAGNOSIS — Z853 Personal history of malignant neoplasm of breast: Secondary | ICD-10-CM | POA: Insufficient documentation

## 2018-11-06 DIAGNOSIS — R200 Anesthesia of skin: Secondary | ICD-10-CM | POA: Diagnosis not present

## 2018-11-06 DIAGNOSIS — R2689 Other abnormalities of gait and mobility: Secondary | ICD-10-CM | POA: Insufficient documentation

## 2018-11-06 DIAGNOSIS — Z9221 Personal history of antineoplastic chemotherapy: Secondary | ICD-10-CM | POA: Insufficient documentation

## 2018-11-06 DIAGNOSIS — E119 Type 2 diabetes mellitus without complications: Secondary | ICD-10-CM | POA: Diagnosis not present

## 2018-11-06 DIAGNOSIS — K219 Gastro-esophageal reflux disease without esophagitis: Secondary | ICD-10-CM | POA: Diagnosis not present

## 2018-11-06 DIAGNOSIS — I10 Essential (primary) hypertension: Secondary | ICD-10-CM | POA: Diagnosis present

## 2018-11-06 DIAGNOSIS — R202 Paresthesia of skin: Secondary | ICD-10-CM | POA: Diagnosis not present

## 2018-11-06 LAB — BASIC METABOLIC PANEL
Anion gap: 8 (ref 5–15)
BUN: 10 mg/dL (ref 8–23)
CO2: 27 mmol/L (ref 22–32)
Calcium: 9.5 mg/dL (ref 8.9–10.3)
Chloride: 105 mmol/L (ref 98–111)
Creatinine, Ser: 0.93 mg/dL (ref 0.44–1.00)
GFR calc Af Amer: >60 mL/min (ref >60)
GFR calc non Af Amer: 60 mL/min — ABNORMAL LOW (ref >60)
Glucose, Bld: 119 mg/dL — ABNORMAL HIGH (ref 70–99)
Potassium: 3.9 mmol/L (ref 3.5–5.1)
Sodium: 140 mmol/L (ref 135–145)

## 2018-11-06 LAB — CBC
HCT: 42.8 % (ref 36.0–46.0)
Hemoglobin: 14.3 g/dL (ref 12.0–15.0)
MCH: 30.1 pg (ref 26.0–34.0)
MCHC: 33.4 g/dL (ref 30.0–36.0)
MCV: 90.1 fL (ref 80.0–100.0)
Platelets: 254 10*3/uL (ref 150–400)
RBC: 4.75 MIL/uL (ref 3.87–5.11)
RDW: 12.1 % (ref 11.5–15.5)
WBC: 7.3 10*3/uL (ref 4.0–10.5)
nRBC: 0 % (ref 0.0–0.2)

## 2018-11-06 MED ORDER — SODIUM CHLORIDE 0.9% FLUSH: 3.0000 mL | Freq: Once | INTRAVENOUS | Status: DC

## 2018-11-06 NOTE — ED Provider Notes (Signed)
[facility] Emergency Department Provider Note   ____________________________________________   I have reviewed the triage vital signs and the nursing notes.   HISTORY  Chief Complaint Numbness and Hip Pain   History limited by: Not Limited   HPI Leah Schaefer is a 78 y.o. female who presents to the emergency department today because of concerns for left arm numbness.  Patient states that symptoms started roughly 6:00 tonight.  She was paying some bills when she noticed some numbness started at her fingertips on her left arm.  It did not extend it up into her left forearm and then left upper arm.  She did have some associated weakness.  By the time of my exam however patient states that the symptoms have essentially all resolved.  She still having some tingling in her fingertips but otherwise does not feel any other numbness.  While this was going on she says she did have a slight headache.  She denies any blurry vision or slurred speech.  States she had similar episodes one previous time somewhat recently although was not evaluated at that time.  Denies any recent trauma to her head.  Records reviewed. Per medical record review patient has a history of breast cancer, DM, HLD, HTN.   Past Medical History:  Diagnosis Date  . Breast cancer ([place])   . Breast cancer of upper-inner quadrant of right female breast ([place]) 09/21/2015   T1bN0 " 6m; ER100%, PR 90%, HER-2/neu not overexpressed.  . Cystitis   . Diabetes mellitus without complication (HCC)    NO MEDS-DIET CONTROLLED  . GERD (gastroesophageal reflux disease)   . Hernia 2011  . Hyperlipidemia   . Hypertension   . Malignant neoplasm of upper-inner quadrant of female breast ([place] 08/17/2011   Left, T2 (2.3 cm) N0, triple negative. Chemotherapy/post wide excision whole breast radiation.  . 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 Personal history of chemotherapy 2013  .  Personal history of malignant neoplasm of breast   . Personal history of radiation therapy 2017    Rt  . Vertigo     Patient Active Problem List   Diagnosis Date Noted  . 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 Umbilical pain 135/46/5681 . Breast mass, left 04/07/2016  . Controlled type 2 diabetes mellitus without complication ([place] 027/51/7001 . Allergic rhinitis 03/30/2015  . Anxiety 03/30/2015  . Carotid arterial disease ([place] 03/30/2015  . Diabetes mellitus, type 2 ([place] 03/30/2015  . Essential (primary) hypertension 03/30/2015  . Hypercholesteremia 03/30/2015  . Left leg pain 03/30/2015  . LBP (low back pain) 03/30/2015  . Neuropathic pain 03/30/2015  . Burning or prickling sensation 03/30/2015  . Neuralgia neuritis, sciatic nerve 03/30/2015  . Skin cyst 11/24/2014  . Carcinoma of upper-outer quadrant of right breast in female, estrogen receptor positive ([place] 08/17/2011  . Allergic reaction 11/29/2009  . Adaptation reaction 04/30/2009  . Acid reflux 04/30/2009  . Stricture and stenosis of cervix 01/28/2009    Past Surgical History:  Procedure Laterality Date  . BREAST BIOPSY Left 07/18/2011   +  . BREAST BIOPSY Left 09/21/2015   neg  . BREAST BIOPSY Left 04/05/2016   neg  . BREAST EXCISIONAL BIOPSY Right 2017   +  . BREAST LUMPECTOMY Right 09/30/2015  . BREAST LUMPECTOMY Left 08/17/2011  . BREAST LUMPECTOMY WITH SENTINEL LYMPH NODE BIOPSY Right 09/30/2015   Procedure: BREAST LUMPECTOMY WITH SENTINEL LYMPH NODE BX;  Surgeon: JDellis Filbert  Amedeo Kinsman, MD;  Location: [facility] ORS;  Service: General;  Laterality: Right;  . BREAST SURGERY Left 2012   wide excision  . BREAST SURGERY Left November 2013   Core biopsy of the upper-outer quadrant showed fat necrosis.  . CHOLECYSTECTOMY  2007  . COLONOSCOPY  2011   Dr Candace Cruise  . COLONOSCOPY WITH PROPOFOL N/A 02/25/2015   Procedure: COLONOSCOPY WITH PROPOFOL;  Surgeon: Hulen Luster, MD;  [facility] END[facility];  Service: Gastroenterology;   Laterality: N/A;  . DILATION AND CURETTAGE OF UTERUS  2004  . HERNIA REPAIR Right 01/60/1093   Umbilical/ventral hernia repaired with 6.4 cm Proceed ventral patch  . PORT-A-CATH REMOVAL    . PORTACATH PLACEMENT  2013  . 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  Marland Kitchen UPPER GI ENDOSCOPY  2011    Prior to Admission medications   Medication Sig Start Date End Date Taking? Authorizing Provider  ACCU-CHEK FASTCLIX LANCETS MISC CHECK SUGAR ONCE DAILY  03/07/18   Jerrol Banana., MD  acetaminophen (TYLENOL) 500 MG tablet Take 500 mg by mouth every 6 (six) hours as needed for mild pain or headache.     [provider]  acyclovir (ZOVIRAX) 200 MG capsule TAKE 1 CAPSULE EVERY DAY 09/19/18   Jerrol Banana., MD  Alcohol Swabs (B-D SINGLE USE SWABS REGULAR) PADS CHECK SUGAR ONCE DAILY 03/05/18   Jerrol Banana., MD  aspirin EC 81 MG tablet Take 81 mg by mouth daily.    [provider]  atorvastatin (LIPITOR) 10 MG tablet TAKE 1 TABLET EVERY EVENING 09/19/18   Jerrol Banana., MD  bimatoprost (LUMIGAN) 0.01 % SOLN Place 1 drop into both eyes at bedtime.     [provider]  Blood Glucose Calibration (ACCU-CHEK SMARTVIEW CONTROL) LIQD  01/10/16   [provider]  Blood Glucose Monitoring Suppl (ACCU-CHEK AVIVA PLUS) w/Device KIT USE AS DIRECTED 08/08/18   Jerrol Banana., MD  Calcium Carb-Cholecalciferol (CALCIUM 600+D3) 600-800 MG-UNIT TABS Take 1 tablet by mouth 2 (two) times daily.    [provider]  cholecalciferol (VITAMIN D) 1000 units tablet Take 1,000 Units by mouth daily.    [provider]  cyanocobalamin 500 MCG tablet Take 500 mcg by mouth daily.    [provider]  escitalopram (LEXAPRO) 20 MG tablet TAKE 1 TABLET EVERY DAY (alternates between this and 2 10 mg tablets (total '20mg'$ ) daily)  07/24/18   Jerrol Banana., MD  gabapentin (NEURONTIN) 300 MG capsule Take 2 capsules (600 mg total) by mouth at bedtime. 11/22/17   Jerrol Banana., MD  glucose blood [facility]) test strip 1 each by Other route daily. Use as instructed 03/12/18   Jerrol Banana., MD  Lancets [facility]) lancets Use as instructed 05/30/18   Jerrol Banana., MD  latanoprost Ivin Poot) 0.005 % ophthalmic solution Place 1 drop into both eyes at bedtime.  10/11/16   [provider]  letrozole (FEMARA) 2.5 MG tablet Take 1 tablet (2.5 mg total) by mouth daily. 05/30/18   Jerrol Banana., MD  meclizine (ANTIVERT) 25 MG tablet Take 0.5 tablets (12.5 mg total) by mouth 3 (three) times daily as needed for dizziness. 10/27/15   Hinda Kehr, MD  meloxicam (MOBIC) 15 MG tablet TAKE 1 TABLET EVERY DAY 09/19/18   Eulas Post  Brooke Bonito., MD  metFORMIN (GLUCOPHAGE) 500 MG tablet TAKE 1 TABLET BY MOUTH TWICE DAILY WITH MEALS 09/16/18   Jerrol Banana., MD  metoprolol tartrate (LOPRESSOR) 25 MG tablet TAKE 1/2 TABLET TWICE DAILY 09/16/18   Jerrol Banana., MD  Multiple Vitamin (MULTIVITAMIN WITH MINERALS) TABS tablet Take 1 tablet by mouth daily.    [provider]  omega-3 acid ethyl esters (LOVAZA) 1 g capsule Take 3 g by mouth daily.     [provider]  pantoprazole (PROTONIX) 40 MG tablet TAKE 1 TABLET TWICE DAILY 08/09/18   Jerrol Banana., MD  pyridoxine (B-6) 100 MG tablet Take 100 mg by mouth daily.    [provider]  vitamin C (ASCORBIC ACID) 500 MG tablet Take 1,000 mg by mouth daily.    [provider]  vitamin E 400 UNIT capsule Take 400 Units by mouth daily.    [provider]    Allergies Patient has no known allergies.  Family History  Problem Relation Age of Onset  . Lymphoma Father   . Ovarian cancer Sister   . Colon cancer Brother   . Lymphoma Brother   . Cancer Other        stomach   . Cancer Other        stomach  . Breast cancer Neg Hx     Social History Social History   Tobacco Use  . Smoking status: Never Smoker  . Smokeless tobacco: Never Used  Substance Use Topics  . Alcohol use: No  . Drug use: No    Review of Systems Constitutional: No fever/chills Eyes: No visual changes. ENT: No sore throat. Cardiovascular: Denies chest pain. Respiratory: Denies shortness of breath. Gastrointestinal: No abdominal pain.  No nausea, no vomiting.  No diarrhea.   Genitourinary: Negative for dysuria. Musculoskeletal: Negative for back pain. Skin: Negative for rash. Neurological: Positive for left arm numbness.  ____________________________________________   PHYSICAL EXAM:  VITAL SIGNS: ED Triage Vitals  Enc Vitals Group     BP 11/06/18 2110 (!) 125/100     Pulse Rate 11/06/18 2110 77     Resp 11/06/18 2110 18     Temp 11/06/18 2110 98.7 F (37.1 C)     Temp Source 11/06/18 2110 Oral     SpO2 11/06/18 2110 96 %     Weight 11/06/18 2111 188 lb (85.3 kg)     Height 11/06/18 2111 '5\' 6"'$  (1.676 m)     Head Circumference --      Peak Flow --      Pain Score 11/06/18 2111 0   Constitutional: Alert and oriented.  Eyes: Conjunctivae are normal.  ENT      Head: Normocephalic and atraumatic.      Nose: No congestion/rhinnorhea.      Mouth/Throat: Mucous membranes are moist.      Neck: No stridor. Hematological/Lymphatic/Immunilogical: No cervical lymphadenopathy. Cardiovascular: Normal rate, regular rhythm.  No murmurs, rubs, or gallops.  Respiratory: Normal respiratory effort without tachypnea nor retractions. Breath sounds are clear and equal bilaterally. No wheezes/rales/rhonchi. Gastrointestinal: Soft and non tender. No rebound. No guarding.  Genitourinary: Deferred Musculoskeletal: Normal range of motion in all extremities. No lower extremity edema. Neurologic:  Normal speech and language. Face symmetric. Tongue midline. PERRL, EOMI. No pronator drift  bilaterally. Grip strength 5/5 bilaterally. Sensation intact in upper and lower extremities. No lower extremity drift.  Skin:  Skin is warm, dry and intact. No rash noted. Psychiatric: Mood and  affect are normal. Speech and behavior are normal. Patient exhibits appropriate insight and judgment.  ____________________________________________    LABS (pertinent positives/negatives)  CBC wbc 7.3, hgb 14.3, plt 254 BMP wnl except glu 119  ____________________________________________   EKG  I, Nance Pear, attending physician, personally viewed and interpreted this EKG  EKG Time: 2107 Rate: 78 Rhythm: normal sinus rhythm Axis: normal Intervals: qtc 451 QRS: narrow, q wave v1 ST changes: no st elevation Impression: abnormal ekg   ____________________________________________    RADIOLOGY  CT head No acute intracranial abnormality  ____________________________________________   PROCEDURES  Procedures  ____________________________________________   INITIAL IMPRESSION / ASSESSMENT AND PLAN / ED COURSE  Pertinent labs & imaging results that were available during my care of the patient were reviewed by me and considered in my medical decision making (see chart for details).   Patient presented to the emergency department today because of concerns for left arm numbness.  By the time my exam her arm numbness had significantly improved.  Given rapid improvement I do not think patient would be a good candidate for TPA.  Head CT did not show any acute stroke.  Patient states this is now the second time this is happened to her.  She certainly does have risk factors for CVA.  Will plan on admission for further TIA work-up.  Discussed findings and plan with patient.  ___________________________________________   FINAL CLINICAL IMPRESSION(S) / ED DIAGNOSES  Final diagnoses:  Left arm numbness     Note: This dictation was prepared with Dragon dictation. Any transcriptional  errors that result from this process are unintentional     Nance Pear, MD 11/06/18 2307

## 2018-11-06 NOTE — ED Notes (Signed)
.. ED TO INPATIENT HANDOFF REPORT  ED Nurse Name and Phone #: Deneise Lever [phone]  D Name/Age/Gender Leah Schaefer 78 y.o. female Room/Bed: ED01A/ED01A  Code Status   Code Status: Not on file  Home/SNF/Other Home Patient oriented to: self, place, time and situation Is this baseline? Yes   Triage Complete: Triage complete  Chief Complaint high bp, pain and tingling in arm and neck  Triage Note Pt presents via pov from home with tingling in her left arm and neck. Pt states it started at 1800. Pt states she was fine at 1755 and was sitting at her table paying bills and began to feel numb in her left hand. The numbness radiated all the way up to her neck. Pt alert & oriented, NAD noted. No neuro deficits noted at triage.   Allergies No Known Allergies  Level of Care/Admitting Diagnosis ED Disposition    ED Disposition Condition [facility] Area: [place] [[id number]]  Level of Care: Med-Surg [16]  Diagnosis: Left sided numbness [220254]  Admitting Physician: Lance Coon [[id number]]  Attending Physician: Lance Coon [[id number]]  PT Class (Do Not Modify): Observation [104]  PT Acc Code (Do Not Modify): Observation [10022]       B Medical/Surgery History Past Medical History:  Diagnosis Date  . Breast cancer ([place])   . 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  . Cystitis   . Diabetes mellitus without complication (HCC)    NO MEDS-DIET CONTROLLED  . GERD (gastroesophageal reflux disease)   . Hernia 2011  . Hyperlipidemia   . Hypertension   . Malignant ne[place] of [place] 08[place]17[place]   Left, T2 (2.3 cm) N0, triple negative. Chemotherapy/post wide excision whole breast radiation.  . 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 Personal history of chemotherapy 2013  . Personal  history of malignant neoplasm of breast   . Personal history of radiation therapy 2017    Rt  . Vertigo    Past Surgical History:  Procedure Laterality Date  . BREAST BIOPSY Left 07/18/2011   +  . BREAST BIOPSY Left 09/21/2015   neg  . BREAST BIOPSY Left 04/05/2016   neg  . BREAST EXCISIONAL BIOPSY Right 2017   +  . BREAST LUMPECTOMY Right 09/30/2015  . BREAST LUMPECTOMY Left 08/17/2011  . BREAST LUMPECTOMY WITH SENTINEL LYMPH NODE BIOPSY Right 09/30/2015   Procedure: BREAST LUMPECTOMY WITH SENTINEL LYMPH NODE BX;  Surgeon: JRobert Bellow MD;  Location: [facility] ORS;  Service: General;  Laterality: Right;  . BREAST SURGERY Left 2012   wide excision  . BREAST SURGERY Left November 2013   Core biopsy of the upper-outer quadrant showed fat necrosis.  . CHOLECYSTECTOMY  2007  . COLONOSCOPY  2011   Dr OCandace Cruise . COLONOSCOPY WITH PROPOFOL N/A 02/25/2015   Procedure: COLONOSCOPY WITH PROPOFOL;  Surgeon: PHulen Luster MD;  [facility];  Service: Gastroenterology;  Laterality: N/A;  . DILATION AND CURETTAGE OF UTERUS  2004  . HERNIA REPAIR Right 076/16/0737  Umbilical/ventral hernia repaired with 6.4 cm Proceed ventral patch  . PORT-A-CATH REMOVAL    . PORTACATH PLACEMENT  2013  . 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  .Mar[facility] KitchenUPPER GI  ENDOSCOPY  2011     A IV Location/Drains/Wounds Patient Lines/Drains/Airways Status   Active Line/Drains/Airways    Name:   Placement date:   Placement time:   Site:   Days:   Peripheral IV 10/27/15 Left Antecubital   10/27/15    1951    Antecubital   1106   Peripheral IV 11/06/18 Right Antecubital   11/06/18    2135    Antecubital   less than 1   Incision (Closed) 09/30/15 Breast Left   09/30/15    1509     1133          Intake/Output Last 24 hours No intake or output data in the 24 hours ending 11/06/18  2330  Labs/Imaging Results for orders placed or performed during the hospital encounter of 11/06/18 (from the past 48 hour(s))  Basic metabolic panel     Status: Abnormal   Collection Time: 11/06/18  9:32 PM  Result Value Ref Range   Sodium 140 135 - 145 mmol/L   Potassium 3.9 3.5 - 5.1 mmol/L   Chloride 105 98 - 111 mmol/L   CO2 27 22 - 32 mmol/L   Glucose, Bld 119 (H) 70 - 99 mg/dL   BUN 10 8 - 23 mg/dL   Creatinine, Ser 0.93 0.44 - 1.00 mg/dL   Calcium 9.5 8.9 - 10.3 mg/dL   GFR calc non Af Amer 60 (L) >60 mL/min   GFR calc Af Amer >60 >60 mL/min   Anion gap 8 5 - 15    Comment: Performed at [facility], [address] [place], [place] [address]  CBC     Status: None   Collection Time: 11/06/18  9:32 PM  Result Value Ref Range   WBC 7.3 4.0 - 10.5 K/uL   RBC 4.75 3.87 - 5.11 MIL/uL   Hemoglobin 14.3 12.0 - 15.0 g/dL   HCT 42.8 36.0 - 46.0 %   MCV 90.1 80.0 - 100.0 fL   MCH 30.1 26.0 - 34.0 pg   MCHC 33.4 30.0 - 36.0 g/dL   RDW 12.1 11.5 - 15.5 %   Platelets 254 150 - 400 K/uL   nRBC 0.0 0.0 - 0.2 %    Comment: Performed at [facility], [address] [place], [place] [address]   Ct Head Wo Contrast  Result Date: 11/06/2018 CLINICAL DATA:  Initial evaluation for acute left arm numbness. EXAM: CT HEAD WITHOUT CONTRAST TECHNIQUE: Contiguous axial images were obtained from the base of the skull through the vertex without intravenous contrast. COMPARISON:  Prior CT from 10/27/2015. FINDINGS: Brain: Generalized age-related cerebral atrophy with moderate chronic microvascular ischemic disease. Mineralization within the globus pallidum bilaterally. No acute intracranial hemorrhage. No acute large vessel territory infarct. No mass lesion, midline shift or mass effect. No hydrocephalus. No extra-axial fluid collection. Vascular: No hyperdense vessel. Scattered vascular calcifications noted within the carotid siphons. Skull: Scalp soft tissues within normal limits.   Calvarium intact. Sinuses/Orbits: Globes and orbital soft tissues within normal limits. Small retention cyst noted within the right sphenoid sinus. Paranasal sinuses are otherwise clear. No mastoid effusion. Other: None. IMPRESSION: 1. No acute intracranial abnormality. 2. Generalized age-related cerebral atrophy with moderate chronic microvascular ischemic disease. Electronically Signed   By: Jeannine Boga M.D.   On: 11/06/2018 22:34    Pending Labs Unresulted Labs (From admission, onward)    Start     Ordered   11/06/18 2113  Urinalysis, Complete w Microscopic  ONCE - STAT,  STAT     11/06/18 2113   Signed and Held  Hemoglobin A1c  Tomorrow morning,   R     Signed and Held   Signed and Held  Lipid panel  Tomorrow morning,   R    Comments:  Fasting    Signed and Held   Signed and Held  CBC  (enoxaparin (LOVENOX)    CrCl >/= 30 ml/min)  Once,   R    Comments:  Baseline for enoxaparin therapy IF NOT ALREADY DRAWN.  Notify MD if PLT < 100 K.    Signed and Held   Signed and Held  Creatinine, serum  (enoxaparin (LOVENOX)    CrCl >/= 30 ml/min)  Once,   R    Comments:  Baseline for enoxaparin therapy IF NOT ALREADY DRAWN.    Signed and Held   Signed and Held  Creatinine, serum  (enoxaparin (LOVENOX)    CrCl >/= 30 ml/min)  Weekly,   R    Comments:  while on enoxaparin therapy    Signed and Held          Vitals/Pain Today's Vitals   11/06/18 2111 11/06/18 2131 11/06/18 2200 11/06/18 2230  BP:  (!) 149/95 138/77 (!) 146/73  Pulse:  75 78 81  Resp:  20 (!) 22 16  Temp:      TempSrc:      SpO2:  97% 96% 91%  Weight: 85.3 kg     Height: 5' 6" (1.676 m)     PainSc: 0-No pain       Isolation Precautions No active isolations  Medications Medications  sodium chloride flush (NS) 0.9 % injection 3 mL (0 mLs Intravenous Hold 11/06/18 2137)    Mobility walks Low fall risk   Focused Assessments Neuro Assessment Handoff:  Swallow screen pass? Yes  Cardiac Rhythm: Normal  sinus rhythm NIH Stroke Scale ( + Modified Stroke Scale Criteria)  LOC Questions (1b. )   +: Answers both questions correctly LOC Commands (1c. )   + : Performs both tasks correctly Best Gaze (2. )  +: Normal Visual (3. )  +: No visual loss Motor Arm, Left (5a. )   +: No drift Motor Arm, Right (5b. )   +: No drift Motor Leg, Left (6a. )   +: No drift Motor Leg, Right (6b. )   +: No drift Sensory (8. )   +: Normal, no sensory loss Best Language (9. )   +: No aphasia Extinction/Inattention (11.)   +: No Abnormality Modified SS Total  +: 0     Neuro Assessment: Exceptions to WDL(left side numbness) Neuro Checks:      Last Documented NIHSS Modified Score: 0 (11/06/18 2136) Has TPA been given? No If patient is a Neuro Trauma and patient is going to OR before floor call report to [facility] nurse: [phone] or [phone]     R Recommendations: See Admitting Provider Note  Report given to:   Additional Notes:

## 2018-11-06 NOTE — ED Notes (Signed)
Pt's belongings given to daughter at this time. Purse and medications sent home with pt's daughter per pt request.

## 2018-11-06 NOTE — H&P (Signed)
[place] [facility] [place] NAME: Leah Schaefer    MR#:  [id number]  DATE OF BIRTH:  [birth year]  DATE OF ADMISSION:  11/06/2018  PRIMARY CARE PHYSICIAN: Jerrol Banana., MD   REQUESTING/REFERRING PHYSICIAN: Archie Balboa, MD  CHIEF COMPLAINT:   Chief Complaint  Patient presents with  . Numbness  . Hip Pain    HISTORY OF PRESENT ILLNESS:  Leah Schaefer  is a 78 y.o. female who presents with chief complaint as above.  Patient presents to the ED with a complaint of left-sided numbness.  Patient states that she had a stroke at some point in the past and has some mild residual left upper and lower extremity weakness.  However, sensation of numbness that started in her left arm and migrated up to her left neck and the left side of her face today was a new symptom.  She did not come to the ED initially, but after several hours decided to come in and be evaluated.  Work-up in the ED was initially within normal limits and the patient symptoms have now resolved, though given her risk factors suspicion for stroke as a possibility remains high.  Hospitalist were called for admission and evaluation  PAST MEDICAL HISTORY:   Past Medical History:  Diagnosis Date  . Breast cancer ([place])   . 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  . Cystitis   . Diabetes mellitus without complication (HCC)    NO MEDS-DIET CONTROLLED  . GERD (gastroesophageal reflux disease)   . Hernia 2011  . Hyperlipidemia   . Hypertension   . Malignant neoplasm of [place]-inner quadrant of female breast ([place] 08/17/2011   Left, T2 (2.3 cm) N0, triple negative. Chemotherapy/post wide excision whole breast radiation.  . 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 Personal history of chemotherapy 2013  . Personal history of malignant neoplasm of breast   .  Personal history of radiation therapy 2017    Rt  . Vertigo      PAST SURGICAL HISTORY:   Past Surgical History:  Procedure Laterality Date  . BREAST BIOPSY Left 07/18/2011   +  . BREAST BIOPSY Left 09/21/2015   neg  . BREAST BIOPSY Left 04/05/2016   neg  . BREAST EXCISIONAL BIOPSY Right 2017   +  . BREAST LUMPECTOMY Right 09/30/2015  . BREAST LUMPECTOMY Left 08/17/2011  . BREAST LUMPECTOMY WITH SENTINEL LYMPH NODE BIOPSY Right 09/30/2015   Procedure: BREAST LUMPECTOMY WITH SENTINEL LYMPH NODE BX;  Surgeon: JRobert Bellow MD;  Location: [facility] ORS;  Service: General;  Laterality: Right;  . BREAST SURGERY Left 2012   wide excision  . BREAST SURGERY Left November 2013   Core biopsy of the upper-outer quadrant showed fat necrosis.  . CHOLECYSTECTOMY  2007  . COLONOSCOPY  2011   Dr OCandace Cruise . COLONOSCOPY WITH PROPOFOL N/A 02/25/2015   Procedure: COLONOSCOPY WITH PROPOFOL;  Surgeon: PHulen Luster MD;  [facility];  Service: Gastroenterology;  Laterality: N/A;  . DILATION AND CURETTAGE OF UTERUS  2004  . HERNIA REPAIR Right 062/70/3500  Umbilical/ventral hernia repaired with 6.4 cm Proceed ventral patch  . PORT-A-CATH REMOVAL    . PORTACATH PLACEMENT  2013  . 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  Marland Kitchen UPPER GI ENDOSCOPY  2011     SOCIAL HISTORY:   Social History   Tobacco Use  . Smoking status: Never Smoker  . Smokeless tobacco: Never Used  Substance Use Topics  . Alcohol use: No     FAMILY HISTORY:   Family History  Problem Relation Age of Onset  . Lymphoma Father   . Ovarian cancer Sister   . Colon cancer Brother   . Lymphoma Brother   . Cancer Other        stomach  . Cancer Other        stomach  . Breast cancer Neg Hx      DRUG ALLERGIES:  No Known Allergies  MEDICATIONS AT HOME:   Prior to Admission medications    Medication Sig Start Date End Date Taking? Authorizing Provider  ACCU-CHEK FASTCLIX LANCETS MISC CHECK SUGAR ONCE DAILY  03/07/18   Jerrol Banana., MD  acetaminophen (TYLENOL) 500 MG tablet Take 500 mg by mouth every 6 (six) hours as needed for mild pain or headache.     [provider]  acyclovir (ZOVIRAX) 200 MG capsule TAKE 1 CAPSULE EVERY DAY 09/19/18   Jerrol Banana., MD  Alcohol Swabs (B-D SINGLE USE SWABS REGULAR) PADS CHECK SUGAR ONCE DAILY 03/05/18   Jerrol Banana., MD  aspirin EC 81 MG tablet Take 81 mg by mouth daily.    [provider]  atorvastatin (LIPITOR) 10 MG tablet TAKE 1 TABLET EVERY EVENING 09/19/18   Jerrol Banana., MD  bimatoprost (LUMIGAN) 0.01 % SOLN Place 1 drop into both eyes at bedtime.     [provider]  Blood Glucose Calibration (ACCU-CHEK SMARTVIEW CONTROL) LIQD  01/10/16   [provider]  Blood Glucose Monitoring Suppl (ACCU-CHEK AVIVA PLUS) w/Device KIT USE AS DIRECTED 08/08/18   Jerrol Banana., MD  Calcium Carb-Cholecalciferol (CALCIUM 600+D3) 600-800 MG-UNIT TABS Take 1 tablet by mouth 2 (two) times daily.    [provider]  cholecalciferol (VITAMIN D) 1000 units tablet Take 1,000 Units by mouth daily.    [provider]  cyanocobalamin 500 MCG tablet Take 500 mcg by mouth daily.    [provider]  escitalopram (LEXAPRO) 20 MG tablet TAKE 1 TABLET EVERY DAY (alternates between this and 2 10 mg tablets (total 73m) daily) 07/24/18   GJerrol Banana, MD  gabapentin (NEURONTIN) 300 MG capsule Take 2 capsules (600 mg total) by mouth at bedtime. 11/22/17   GJerrol Banana, MD  glucose blood ([facility] test strip 1 each by Other route daily. Use as instructed 03/12/18   GJerrol Banana, MD  Lancets ([facility] lancets Use as instructed 05/30/18   GJerrol Banana, MD  latanoprost (Ivin Poot 0.005 % ophthalmic solution Place 1  drop into both eyes at bedtime.  10/11/16   [provider]  letrozole (FEMARA) 2.5 MG tablet Take 1 tablet (2.5 mg total) by mouth daily. 05/30/18   GJerrol Banana, MD  meclizine (ANTIVERT) 25 MG tablet Take 0.5 tablets (12.5 mg total) by mouth 3 (three) times daily as needed for dizziness. 10/27/15   FHinda Kehr MD  meloxicam (MOBIC) 15 MG tablet TAKE 1 TABLET EVERY DAY 09/19/18   GJerrol Banana, MD  metFORMIN (GLUCOPHAGE) 500 MG tablet TAKE 1 TABLET BY MOUTH TWICE DAILY WITH MEALS 09/16/18   GJerrol Banana, MD  metoprolol  tartrate (LOPRESSOR) 25 MG tablet TAKE 1/2 TABLET TWICE DAILY 09/16/18   Jerrol Banana., MD  Multiple Vitamin (MULTIVITAMIN WITH MINERALS) TABS tablet Take 1 tablet by mouth daily.    [provider]  omega-3 acid ethyl esters (LOVAZA) 1 g capsule Take 3 g by mouth daily.     [provider]  pantoprazole (PROTONIX) 40 MG tablet TAKE 1 TABLET TWICE DAILY 08/09/18   Jerrol Banana., MD  pyridoxine (B-6) 100 MG tablet Take 100 mg by mouth daily.    [provider]  vitamin C (ASCORBIC ACID) 500 MG tablet Take 1,000 mg by mouth daily.    [provider]  vitamin E 400 UNIT capsule Take 400 Units by mouth daily.    [provider]    REVIEW OF SYSTEMS:  Review of Systems  Constitutional: Negative for chills, fever, malaise/fatigue and weight loss.  HENT: Negative for ear pain, hearing loss and tinnitus.   Eyes: Negative for blurred vision, double vision, pain and redness.  Respiratory: Negative for cough, hemoptysis and shortness of breath.   Cardiovascular: Negative for chest pain, palpitations, orthopnea and leg swelling.  Gastrointestinal: Negative for abdominal pain, constipation, diarrhea, nausea and vomiting.  Genitourinary: Negative for dysuria, frequency and hematuria.  Musculoskeletal: Negative for back pain, joint pain and neck pain.  Skin:       No acne, rash, or lesions   Neurological: Positive for sensory change. Negative for dizziness, tremors, focal weakness and weakness.  Endo/Heme/Allergies: Negative for polydipsia. Does not bruise/bleed easily.  Psychiatric/Behavioral: Negative for depression. The patient is not nervous/anxious and does not have insomnia.      VITAL SIGNS:   Vitals:   11/06/18 2111 11/06/18 2131 11/06/18 2200 11/06/18 2230  BP:  (!) 149/95 138/77 (!) 146/73  Pulse:  75 78 81  Resp:  20 (!) 22 16  Temp:      TempSrc:      SpO2:  97% 96% 91%  Weight: 85.3 kg     Height: _0  (1.676 m)      Wt Readings from Last 3 Encounters:  11/06/18 85.3 kg  10/17/18 93.9 kg  06/12/18 (P) 93.6 kg    PHYSICAL EXAMINATION:  Physical Exam  Vitals reviewed. Constitutional: She is oriented to person, place, and time. She appears well-developed and well-nourished. No distress.  HENT:  Head: Normocephalic and atraumatic.  Mouth/Throat: Oropharynx is clear and moist.  Eyes: Pupils are equal, round, and reactive to light. Conjunctivae and EOM are normal. No scleral icterus.  Neck: Normal range of motion. Neck supple. No JVD present. No thyromegaly present.  Cardiovascular: Normal rate, regular rhythm and intact distal pulses. Exam reveals no gallop and no friction rub.  No murmur heard. Respiratory: Effort normal and breath sounds normal. No respiratory distress. She has no wheezes. She has no rales.  GI: Soft. Bowel sounds are normal. She exhibits no distension. There is no abdominal tenderness.  Musculoskeletal: Normal range of motion.        General: No edema.     Comments: No arthritis, no gout  Lymphadenopathy:    She has no cervical adenopathy.  Neurological: She is alert and oriented to person, place, and time. No cranial nerve deficit.  Neurologic: Cranial nerves II-XII intact, Sensation intact to light touch/pinprick, 5/5 strength in right extremities, with chronic 4/5 strength in left extremities -unchanged per patient's report, no  dysarthria, no aphasia, no dysphagia, memory intact, finger to nose testing showed no abnormality,  no pronator drift  Skin: Skin is warm and dry. No rash noted. No erythema.  Psychiatric: She has a normal mood and affect. Her behavior is normal. Judgment and thought content normal.    LABORATORY PANEL:   CBC Recent Labs  Lab 11/06/18 2132  WBC 7.3  HGB 14.3  HCT 42.8  PLT 254   ------------------------------------------------------------------------------------------------------------------  Chemistries  Recent Labs  Lab 11/06/18 2132  NA 140  K 3.9  CL 105  CO2 27  GLUCOSE 119*  BUN 10  CREATININE 0.93  CALCIUM 9.5   ------------------------------------------------------------------------------------------------------------------  Cardiac Enzymes No results for input(s): TROPONINI in the last 168 hours. ------------------------------------------------------------------------------------------------------------------  RADIOLOGY:  Ct Head Wo Contrast  Result Date: 11/06/2018 CLINICAL DATA:  Initial evaluation for acute left arm numbness. EXAM: CT HEAD WITHOUT CONTRAST TECHNIQUE: Contiguous axial images were obtained from the base of the skull through the vertex without intravenous contrast. COMPARISON:  Prior CT from 10/27/2015. FINDINGS: Brain: Generalized age-related cerebral atrophy with moderate chronic microvascular ischemic disease. Mineralization within the globus pallidum bilaterally. No acute intracranial hemorrhage. No acute large vessel territory infarct. No mass lesion, midline shift or mass effect. No hydrocephalus. No extra-axial fluid collection. Vascular: No hyperdense vessel. Scattered vascular calcifications noted within the carotid siphons. Skull: Scalp soft tissues within normal limits.  Calvarium intact. Sinuses/Orbits: Globes and orbital soft tissues within normal limits. Small retention cyst noted within the right sphenoid sinus. Paranasal sinuses are  otherwise clear. No mastoid effusion. Other: None. IMPRESSION: 1. No acute intracranial abnormality. 2. Generalized age-related cerebral atrophy with moderate chronic microvascular ischemic disease. Electronically Signed   By: Jeannine Boga M.D.   On: 11/06/2018 22:34    EKG:   Orders placed or performed during the hospital encounter of 11/06/18  . EKG 12-Lead  . EKG 12-Lead  . ED EKG  . ED EKG    IMPRESSION AND PLAN:  Principal Problem:   Left sided numbness -high suspicion for possible stroke.  Admit per TIA/stroke admission order set with appropriate imaging, labs, consults Active Problems:   Diabetes mellitus, type 2 ([place]) -sliding scale insulin coverage   Essential (primary) hypertension -24 hours permissive hypertension, then continue home meds   Carcinoma of upper-outer quadrant of right breast in female, estrogen receptor positive ([place]) -home dose letrozole   Anxiety -home dose anxiolytic   Acid reflux -home dose PPI   Hypercholesteremia -home dose antilipid  Chart review performed and case discussed with ED provider. Labs, imaging and/or ECG reviewed by provider and discussed with patient/family. Management plans discussed with the patient and/or family.  DVT PROPHYLAXIS: SubQ lovenox   GI PROPHYLAXIS:  PPI   ADMISSION STATUS: Observation  CODE STATUS: Full  TOTAL TIME TAKING CARE OF THIS PATIENT: 40 minutes.   Ethlyn Daniels 11/06/2018, 11:25 PM  [facility]  Office  [phone]  CC: Primary care physician; Jerrol Banana., MD  Note:  This document was prepared using Dragon voice recognition software and may include unintentional dictation errors.

## 2018-11-06 NOTE — ED Triage Notes (Addendum)
Pt presents via pov from home with tingling in her left arm and neck. Pt states it started at 1800. Pt states she was fine at 1755 and was sitting at her table paying bills and began to feel numb in her left hand. The numbness radiated all the way up to her neck. Pt alert & oriented, NAD noted. No neuro deficits noted at triage.

## 2018-11-07 ENCOUNTER — Observation Stay: Payer: Medicare HMO

## 2018-11-07 ENCOUNTER — Observation Stay
Admit: 2018-11-07 | Discharge: 2018-11-07 | Disposition: A | Discharge status: Home / Self Care | Payer: Medicare HMO | Attending: Internal Medicine | Admitting: Internal Medicine

## 2018-11-07 DIAGNOSIS — I6782 Cerebral ischemia: Secondary | ICD-10-CM | POA: Diagnosis not present

## 2018-11-07 DIAGNOSIS — G459 Transient cerebral ischemic attack, unspecified: Secondary | ICD-10-CM | POA: Diagnosis not present

## 2018-11-07 DIAGNOSIS — R200 Anesthesia of skin: Secondary | ICD-10-CM

## 2018-11-07 DIAGNOSIS — C50411 Malignant neoplasm of upper-outer quadrant of right female breast: Secondary | ICD-10-CM | POA: Diagnosis not present

## 2018-11-07 DIAGNOSIS — E119 Type 2 diabetes mellitus without complications: Secondary | ICD-10-CM | POA: Diagnosis not present

## 2018-11-07 DIAGNOSIS — R202 Paresthesia of skin: Secondary | ICD-10-CM | POA: Diagnosis not present

## 2018-11-07 DIAGNOSIS — I10 Essential (primary) hypertension: Secondary | ICD-10-CM | POA: Diagnosis not present

## 2018-11-07 LAB — CBC
HCT: 41.1 % (ref 36.0–46.0)
Hemoglobin: 13.5 g/dL (ref 12.0–15.0)
MCH: 30.2 pg (ref 26.0–34.0)
MCHC: 32.8 g/dL (ref 30.0–36.0)
MCV: 91.9 fL (ref 80.0–100.0)
Platelets: 233 10*3/uL (ref 150–400)
RBC: 4.47 MIL/uL (ref 3.87–5.11)
RDW: 12 % (ref 11.5–15.5)
WBC: 6.7 10*3/uL (ref 4.0–10.5)
nRBC: 0 % (ref 0.0–0.2)

## 2018-11-07 LAB — GLUCOSE, CAPILLARY
Glucose-Capillary: 126 mg/dL — ABNORMAL HIGH (ref 70–99)
Glucose-Capillary: 158 mg/dL — ABNORMAL HIGH (ref 70–99)
Glucose-Capillary: 209 mg/dL — ABNORMAL HIGH (ref 70–99)

## 2018-11-07 LAB — URINALYSIS, COMPLETE (UACMP) WITH MICROSCOPIC
Bacteria, UA: NONE SEEN
Bilirubin Urine: NEGATIVE
Glucose, UA: NEGATIVE mg/dL
Hgb urine dipstick: NEGATIVE
Ketones, ur: NEGATIVE mg/dL
Nitrite: NEGATIVE
Protein, ur: NEGATIVE mg/dL
Specific Gravity, Urine: 1.004 — ABNORMAL LOW (ref 1.005–1.030)
Squamous Epithelial / LPF: NONE SEEN (ref 0–5)
pH: 6 (ref 5.0–8.0)

## 2018-11-07 LAB — ECHOCARDIOGRAM COMPLETE
Height: 66 in
Weight: 3264 oz

## 2018-11-07 LAB — LIPID PANEL
Cholesterol: 103 mg/dL (ref 0–200)
HDL: 32 mg/dL — ABNORMAL LOW (ref >40)
LDL Cholesterol: 52 mg/dL (ref 0–99)
Total CHOL/HDL Ratio: 3.2 RATIO
Triglycerides: 94 mg/dL (ref <150)
VLDL: 19 mg/dL (ref 0–40)

## 2018-11-07 LAB — CREATININE, SERUM
Creatinine, Ser: 0.79 mg/dL (ref 0.44–1.00)
GFR calc Af Amer: >60 mL/min (ref >60)
GFR calc non Af Amer: >60 mL/min (ref >60)

## 2018-11-07 LAB — HEMOGLOBIN A1C
Hgb A1c MFr Bld: 7.1 % — ABNORMAL HIGH (ref 4.8–5.6)
Mean Plasma Glucose: 157.07 mg/dL

## 2018-11-07 MED ORDER — ACETAMINOPHEN 325 MG PO TABS
650.0000 mg | ORAL_TABLET | ORAL | Status: DC | PRN
  Administered 2018-11-07: 650 mg via ORAL
  Filled 2018-11-07: qty 2

## 2018-11-07 MED ORDER — ESCITALOPRAM OXALATE 10 MG PO TABS
10.0000 mg | ORAL_TABLET | ORAL | Status: DC
  Filled 2018-11-07: qty 1

## 2018-11-07 MED ORDER — ACETAMINOPHEN 650 MG RE SUPP: 650.0000 mg | RECTAL | Status: DC | PRN

## 2018-11-07 MED ORDER — ENSURE MAX PROTEIN PO LIQD
11.0000 [oz_av] | Freq: Two times a day (BID) | ORAL | Status: DC
  Administered 2018-11-07: 13:00:00 11 [oz_av] via ORAL
  Filled 2018-11-07: qty 330

## 2018-11-07 MED ORDER — ATORVASTATIN CALCIUM 20 MG PO TABS: 40.0000 mg | ORAL_TABLET | Freq: Every evening | ORAL | Status: DC

## 2018-11-07 MED ORDER — ESCITALOPRAM OXALATE 10 MG PO TABS: 20.0000 mg | ORAL_TABLET | Freq: Every day | ORAL | Status: DC

## 2018-11-07 MED ORDER — ENOXAPARIN SODIUM 40 MG/0.4ML ~~LOC~~ SOLN
40.0000 mg | SUBCUTANEOUS | Status: DC
  Administered 2018-11-07: 40 mg via SUBCUTANEOUS
  Filled 2018-11-07: qty 0.4

## 2018-11-07 MED ORDER — ATORVASTATIN CALCIUM 20 MG PO TABS: 10.0000 mg | ORAL_TABLET | Freq: Every evening | ORAL | Status: DC

## 2018-11-07 MED ORDER — LETROZOLE 2.5 MG PO TABS
2.5000 mg | ORAL_TABLET | Freq: Every day | ORAL | Status: DC
  Administered 2018-11-07: 11:00:00 2.5 mg via ORAL
  Filled 2018-11-07: qty 1

## 2018-11-07 MED ORDER — STROKE: EARLY STAGES OF RECOVERY BOOK
Freq: Once | Status: AC
  Administered 2018-11-07: 02:00:00

## 2018-11-07 MED ORDER — ASPIRIN 325 MG PO TABS: 325.0000 mg | ORAL_TABLET | Freq: Every day | ORAL | 0 refills | Status: DC

## 2018-11-07 MED ORDER — LATANOPROST 0.005 % OP SOLN
1.0000 [drp] | Freq: Every day | OPHTHALMIC | Status: DC
  Filled 2018-11-07: qty 2.5

## 2018-11-07 MED ORDER — ESCITALOPRAM OXALATE 10 MG PO TABS: 20.0000 mg | ORAL_TABLET | ORAL | Status: DC

## 2018-11-07 MED ORDER — PANTOPRAZOLE SODIUM 40 MG PO TBEC
40.0000 mg | DELAYED_RELEASE_TABLET | Freq: Two times a day (BID) | ORAL | Status: DC
  Administered 2018-11-07: 40 mg via ORAL
  Filled 2018-11-07: qty 1

## 2018-11-07 MED ORDER — ESCITALOPRAM OXALATE 10 MG PO TABS
20.0000 mg | ORAL_TABLET | Freq: Every day | ORAL | Status: DC
  Administered 2018-11-07: 20 mg via ORAL
  Filled 2018-11-07: qty 2

## 2018-11-07 MED ORDER — INSULIN ASPART 100 UNIT/ML ~~LOC~~ SOLN: 0.0000 [IU] | Freq: Every day | SUBCUTANEOUS | Status: DC

## 2018-11-07 MED ORDER — INSULIN ASPART 100 UNIT/ML ~~LOC~~ SOLN
0.0000 [IU] | Freq: Three times a day (TID) | SUBCUTANEOUS | Status: DC
  Administered 2018-11-07: 3 [IU] via SUBCUTANEOUS
  Filled 2018-11-07: qty 1

## 2018-11-07 MED ORDER — ATORVASTATIN CALCIUM 40 MG PO TABS: 40.0000 mg | ORAL_TABLET | Freq: Every evening | ORAL | 0 refills | Status: DC

## 2018-11-07 MED ORDER — ACETAMINOPHEN 160 MG/5ML PO SOLN
650.0000 mg | ORAL | Status: DC | PRN
  Filled 2018-11-07: qty 20.3

## 2018-11-07 MED ORDER — ASPIRIN EC 81 MG PO TBEC
81.0000 mg | DELAYED_RELEASE_TABLET | Freq: Every day | ORAL | Status: DC
  Administered 2018-11-07: 11:00:00 81 mg via ORAL
  Filled 2018-11-07: qty 1

## 2018-11-07 NOTE — TOC Initial Note (Signed)
Transition of Care [facility]) - Initial/Assessment Note    Patient Details  Name: Leah Schaefer MRN: [id number] Date of Birth: 09/26/1940  Transition of Care [facility]) CM/SW Contact:    Shelbie Ammons, RN Phone Number: 11/07/2018, 10:44 AM  Clinical Narrative:  Admitted to [facility] under observation status with the diagnosis of left sided weakness. Lives with husband [name]. Sees Dr. Rosanna Randy as primary care physician.                  Expected Discharge Plan: Home/Self Care Barriers to Discharge: (left sided weakness)   Patient Goals and CMS Choice Patient states their goals for this hospitalization and ongoing recovery are:: (Wants to go home) CMS Medicare.gov Compare Post Acute Care list provided to:: Patient Choice offered to / list presented to : Patient  Expected Discharge Plan and Services Expected Discharge Plan: Home/Self Care In-house Referral: NA Discharge Planning Services: CM Consult Post Acute Care Choice: (undecided) Living arrangements for the past 2 months: Single Family Home                 DME Arranged: N/A DME Agency: (undecided) HH Arranged: (not yet) [place] Agency: NA  Prior Living Arrangements/Services Living arrangements for the past 2 months: Single Family Home Lives with:: Spouse Patient language and need for interpreter reviewed:: No Do you feel safe going back to the place where you live?: Yes      Need for Family Participation in Patient Care: No (Comment) Care giver support system in place?: Yes (comment) Current home services: (none in place) Criminal Activity/Legal Involvement Pertinent to Current Situation/Hospitalization: No - Comment as needed  Activities of Daily Living Home Assistive Devices/Equipment: None ADL Screening (condition at time of admission) Patient's cognitive ability adequate to safely complete daily activities?: Yes Is the patient deaf or have difficulty hearing?: No Does the patient have difficulty seeing, even when  wearing glasses/contacts?: No Does the patient have difficulty concentrating, remembering, or making decisions?: No Patient able to express need for assistance with ADLs?: No Does the patient have difficulty dressing or bathing?: No Independently performs ADLs?: Yes (appropriate for developmental age) Does the patient have difficulty walking or climbing stairs?: No Weakness of Legs: None Weakness of Arms/Hands: Left  Permission Sought/Granted Permission sought to share information with : Case Manager Permission granted to share information with : Yes, Verbal Permission Granted              Emotional Assessment Appearance:: Appears stated age Attitude/Demeanor/Rapport: (calm) Affect (typically observed): Accepting Orientation: : Oriented to Self, Oriented to Place, Oriented to  Time, Oriented to Situation Alcohol / Substance Use: Not Applicable Psych Involvement: No (comment)  Admission diagnosis:  Left arm numbness [R20.0] Patient Active Problem List   Diagnosis Date Noted  . Left sided numbness 11/06/2018  . 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 Umbilical pain 19/14/7829  . Breast mass, left 04/07/2016  . Controlled type 2 diabetes mellitus without complication ([place]) 56/21/3086  . Allergic rhinitis 03/30/2015  . Anxiety 03/30/2015  . Carotid arterial disease ([place]) 03/30/2015  . Diabetes mellitus, type 2 ([place]) 03/30/2015  . Essential (primary) hypertension 03/30/2015  . Hypercholesteremia 03/30/2015  . Left leg pain 03/30/2015  . LBP (low back pain) 03/30/2015  . Neuropathic pain 03/30/2015  . Burning or prickling sensation 03/30/2015  . Neuralgia neuritis, sciatic nerve 03/30/2015  . Skin cyst 11/24/2014  . Carcinoma of upper-outer quadrant of right breast in female, estrogen receptor positive ([place]) 08/17/2011  . Allergic reaction  11/29/2009  . Adaptation reaction 04/30/2009  . Acid reflux 04/30/2009  . Stricture and stenosis of cervix 01/28/2009   PCP:  Jerrol Banana., MD Pharmacy:   [facility] [address] [place] - [address] [place] [address] [place] [address] Phone: [phone] Fax: [facility] - [place], [place] [address] Phone: [phone] Fax: [phone]  [facility] [address] [place]), [place] - [place]) [place] [address] Phone: [phone] Fax: [phone]     Social Determinants of Health (SDOH) Interventions    Readmission Risk Interventions No flowsheet data found.

## 2018-11-07 NOTE — TOC Initial Note (Deleted)
Transition of Care [facility]) - Initial/Assessment Note    Patient Details  Name: Leah Schaefer MRN: [id number] Date of Birth: June 24, 1941  Transition of Care [facility]) CM/SW Contact:    Shelbie Ammons, RN Phone Number: 11/07/2018, 10:23 AM  Clinical Narrative:  Admitted to [facility] with the diagnosis of left sided weakness. Lives with husband, Leah Schaefer. Sees Dr. Rosanna Randy as primary care physician,                        Patient Goals and CMS Choice        Expected Discharge Plan and Services                                    Prior Living Arrangements/Services                       Activities of Daily Living Home Assistive Devices/Equipment: None ADL Screening (condition at time of admission) Patient's cognitive ability adequate to safely complete daily activities?: Yes Is the patient deaf or have difficulty hearing?: No Does the patient have difficulty seeing, even when wearing glasses/contacts?: No Does the patient have difficulty concentrating, remembering, or making decisions?: No Patient able to express need for assistance with ADLs?: No Does the patient have difficulty dressing or bathing?: No Independently performs ADLs?: Yes (appropriate for developmental age) Does the patient have difficulty walking or climbing stairs?: No Weakness of Legs: None Weakness of Arms/Hands: Left  Permission Sought/Granted                  Emotional Assessment              Admission diagnosis:  Left arm numbness [R20.0] Patient Active Problem List   Diagnosis Date Noted  . Left sided numbness 11/06/2018  . 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 Umbilical pain 62/83/6629  . Breast mass, left 04/07/2016  . Controlled type 2 diabetes mellitus without complication ([place]) 47/65/4650  . Allergic rhinitis 03/30/2015  . Anxiety 03/30/2015  . Carotid arterial disease ([place]) 03/30/2015  . Diabetes mellitus, type 2 ([place]) 03/30/2015  . Essential (primary)  hypertension 03/30/2015  . Hypercholesteremia 03/30/2015  . Left leg pain 03/30/2015  . LBP (low back pain) 03/30/2015  . Neuropathic pain 03/30/2015  . Burning or prickling sensation 03/30/2015  . Neuralgia neuritis, sciatic nerve 03/30/2015  . Skin cyst 11/24/2014  . Carcinoma of upper-outer quadrant of right breast in female, estrogen receptor positive ([place]) 08/17/2011  . Allergic reaction 11/29/2009  . Adaptation reaction 04/30/2009  . Acid reflux 04/30/2009  . Stricture and stenosis of cervix 01/28/2009   PCP:  Jerrol Banana., MD Pharmacy:   [facility] [address], [place] - [address] [place] [address] [place] [address] Phone: [phone] Fax: [facility] - [place], [place] [address] Phone: [phone] Fax: [phone]  [facility] [address] [place]), [place] - [place])  [address] Phone: [phone] Fax: [phone]     Social Determinants of Health (SDOH) Interventions    Readmission Risk Interventions No flowsheet data found.

## 2018-11-07 NOTE — Discharge Instructions (Signed)
It was so nice to meet you during this hospitalization!  You came into the hospital with left arm numbness. Your MRI did NOT show a stroke. We think you likely had a TIA (transient ischemic attack), which is where an area of your brain does not get good blood flow, but then the blood flow gets better and your symptoms improve.  I have made the following medication changes: 1. Please increase your aspirin dose form 81mg  daily to 325mg  daily 2. Please increase your lipitor dose from 10mg  daily to 40mg  daily **I have sent new prescriptions for these medications into your pharmacy.  Take care, Dr. Brett Albino

## 2018-11-07 NOTE — Plan of Care (Signed)
  Problem: Education: Goal: Knowledge of disease or condition will improve Outcome: Progressing Goal: Knowledge of secondary prevention will improve Outcome: Progressing Goal: Knowledge of patient specific risk factors addressed and post discharge goals established will improve Outcome: Progressing   Problem: Coping: Goal: Will verbalize positive feelings about self Outcome: Progressing Goal: Will identify appropriate support needs Outcome: Progressing   Problem: Health Behavior/Discharge Planning: Goal: Ability to manage health-related needs will improve Outcome: Progressing   Problem: Self-Care: Goal: Ability to participate in self-care as condition permits will improve Outcome: Progressing Goal: Verbalization of feelings and concerns over difficulty with self-care will improve Outcome: Progressing Goal: Ability to communicate needs accurately will improve Outcome: Progressing   Problem: Nutrition: Goal: Risk of aspiration will decrease Outcome: Not Applicable   Problem: Ischemic Stroke/TIA Tissue Perfusion: Goal: Complications of ischemic stroke/TIA will be minimized Outcome: Progressing   Problem: Education: Goal: Knowledge of General Education information will improve Description Including pain rating scale, medication(s)/side effects and non-pharmacologic comfort measures Outcome: Progressing    Problem: Health Behavior/Discharge Planning: Goal: Ability to manage health-related needs will improve Outcome: Progressing   Problem: Clinical Measurements: Goal: Ability to maintain clinical measurements within normal limits will improve Outcome: Progressing Goal: Will remain free from infection Outcome: Progressing Goal: Diagnostic test results will improve Outcome: Progressing Goal: Respiratory complications will improve Outcome: Not Applicable Goal: Cardiovascular complication will be avoided Outcome: Progressing   Problem: Activity: Goal: Risk for activity  intolerance will decrease Outcome: Progressing   Problem: Nutrition: Goal: Adequate nutrition will be maintained Outcome: Progressing   Problem: Coping: Goal: Level of anxiety will decrease Outcome: Progressing   Problem: Elimination: Goal: Will not experience complications related to bowel motility Outcome: Progressing Goal: Will not experience complications related to urinary retention Outcome: Progressing   Problem: Pain Managment: Goal: General experience of comfort will improve Outcome: Progressing   Problem: Safety: Goal: Ability to remain free from injury will improve Outcome: Progressing   Problem: Skin Integrity: Goal: Risk for impaired skin integrity will decrease Outcome: Progressing

## 2018-11-07 NOTE — Consult Note (Signed)
Reason for Consult:L sided numbness  Referring Physician: Dr. Jannifer Franklin   CC: L sided numbness.   HPI: Leah Schaefer is an 78 y.o. female  with hx of HTN, breast CA, HLD with hx of residual L sided weakness.  Pt states she was normal health until around 6 pm until noted to have transient 30 min episode of L sided numbness that started in all 5 fingers and moved up to her shoulder.  Symptoms have resolved and she is back to her baseline.  Pt was on ASA 72m daily at home and took extra 815mwhen symptoms started.     Past Medical History:  Diagnosis Date  . [place]  . Breast cancer of upper-inner quadrant of right female breast ([facility]   T1bN0 " 1012mER100%, PR 90%, HER-2/neu not overexpressed.  . Cystitis   . Diabetes mellitus without complication (HCC)    NO MEDS-DIET CONTROLLED  . GERD (gastroesophageal reflux disease)   . Hernia 2011  . Hyperlipidemia   . Hypertension   . Malignant neoplasm of upper-inner quadrant of female breast ([facility]/05/2012   Left, T2 (2.3 cm) N0, triple negative. Chemotherapy/post wide excision whole breast radiation.  . 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 Personal history of chemotherapy 2013  . Personal history of malignant neoplasm of breast   . Personal history of radiation therapy 2017    Rt  . Vertigo     Past Surgical History:  Procedure Laterality Date  . BREAST BIOPSY Left 07/18/2011   +  . BREAST BIOPSY Left 09/21/2015   neg  . BREAST BIOPSY Left 04/05/2016   neg  . BREAST EXCISIONAL BIOPSY Right 2017   +  . BREAST LUMPECTOMY Right 09/30/2015  . BREAST LUMPECTOMY Left 08/17/2011  . BREAST LUMPECTOMY WITH SENTINEL LYMPH NODE BIOPSY Right 09/30/2015   Procedure: BREAST LUMPECTOMY WITH SENTINEL LYMPH NODE BX;  Surgeon: JefRobert BellowD;  Location: [facility] ORS;  Service: General;  Laterality: Right;  . BREAST SURGERY Left 2012   wide excision  . BREAST SURGERY Left November 2013    Core biopsy of the upper-outer quadrant showed fat necrosis.  . CHOLECYSTECTOMY  2007  . COLONOSCOPY  2011   Dr Oh Candace Cruise COLONOSCOPY WITH PROPOFOL N/A 02/25/2015   Procedure: COLONOSCOPY WITH PROPOFOL;  Surgeon: PauHulen LusterD;  [facility];  Service: Gastroenterology;  Laterality: N/A;  . DILATION AND CURETTAGE OF UTERUS  2004  . HERNIA REPAIR Right 02/33/83/2919Umbilical/ventral hernia repaired with 6.4 cm Proceed ventral patch  . PORT-A-CATH REMOVAL    . PORTACATH PLACEMENT  2013  . 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  . UMarland KitchenPER GI ENDOSCOPY  2011    Family History  Problem Relation Age of Onset  . Lymphoma Father   . Ovarian cancer Sister   . Colon cancer Brother   . Lymphoma Brother   . Cancer Other        stomach  . Cancer Other        stomach  . Breast cancer Neg Hx     Social History:  reports that she has never smoked. She has never used smokeless tobacco. She reports that she does not drink alcohol or use drugs.  No Known Allergies  Medications: I have reviewed the patient's current medications.  ROS: History obtained from the patient  General ROS: negative for - chills, fatigue, fever, night sweats, weight gain or weight loss Psychological ROS: negative for - behavioral disorder, hallucinations, memory difficulties, mood swings or suicidal ideation Ophthalmic ROS: negative for - blurry vision, double vision, eye pain or loss of vision ENT ROS: negative for - epistaxis, nasal discharge, oral lesions, sore throat, tinnitus or vertigo Allergy and Immunology ROS: negative for - hives or itchy/watery eyes Hematological and Lymphatic ROS: negative for - bleeding problems, bruising or swollen lymph nodes Endocrine ROS: negative for - galactorrhea, hair pattern changes, polydipsia/polyuria or temperature intolerance Respiratory ROS:  negative for - cough, hemoptysis, shortness of breath or wheezing Cardiovascular ROS: negative for - chest pain, dyspnea on exertion, edema or irregular heartbeat Gastrointestinal ROS: negative for - abdominal pain, diarrhea, hematemesis, nausea/vomiting or stool incontinence Genito-Urinary ROS: negative for - dysuria, hematuria, incontinence or urinary frequency/urgency Musculoskeletal ROS: negative for - joint swelling or muscular weakness Neurological ROS: as noted in HPI Dermatological ROS: negative for rash and skin lesion changes  Physical Examination: Blood pressure 120/85, pulse 68, temperature 98.2 F (36.8 C), temperature source Oral, resp. rate 16, height '5\' 6"'  (1.676 m), weight 92.5 kg, SpO2 95 %.  Neurological Examination   Mental Status: Alert, oriented, thought content appropriate.  Speech fluent without evidence of aphasia.  Able to follow 3 step commands without difficulty. Cranial Nerves: II: Discs flat bilaterally; Visual fields grossly normal, pupils equal, round, reactive to light and accommodation III,IV, VI: ptosis not present, extra-ocular motions intact bilaterally V,VII: smile symmetric, facial light touch sensation normal bilaterally VIII: hearing normal bilaterally IX,X: gag reflex present XI: bilateral shoulder shrug XII: midline tongue extension Motor: Right : Upper extremity   5/5    Left:     Upper extremity   4+/5  Lower extremity   5/5     Lower extremity   4+/5 Tone and bulk:normal tone throughout; no atrophy noted Sensory: Pinprick and light touch intact throughout, bilaterally Deep Tendon Reflexes: 2+ and symmetric throughout Plantars: Right: downgoing   Left: downgoing Cerebellar: normal finger-to-nose, normal rapid alternating movements and normal heel-to-shin test Gait: not tested       Laboratory Studies:   Basic Metabolic Panel: Recent Labs  Lab 11/06/18 2132 11/07/18 0317  NA 140  --   K 3.9  --   CL 105  --   CO2 27  --    GLUCOSE 119*  --   BUN 10  --   CREATININE 0.93 0.79  CALCIUM 9.5  --     Liver Function Tests: No results for input(s): AST, ALT, ALKPHOS, BILITOT, PROT, ALBUMIN in the last 168 hours. No results for input(s): LIPASE, AMYLASE in the last 168 hours. No results for input(s): AMMONIA in the last 168 hours.  CBC: Recent Labs  Lab 11/06/18 2132 11/07/18 0317  WBC 7.3 6.7  HGB 14.3 13.5  HCT 42.8 41.1  MCV 90.1 91.9  PLT 254 233    Cardiac Enzymes: No results for input(s): CKTOTAL, CKMB, CKMBINDEX, TROPONINI in the last 168 hours.  BNP: Invalid input(s): POCBNP  CBG: Recent Labs  Lab 11/07/18 0010 11/07/18 0728  GLUCAP 126* 158*    Microbiology: Results for orders placed or performed in visit on 06/14/17  Urine Culture     Status: Abnormal   Collection Time: 06/14/17 12:21 PM  Result Value Ref Range Status   MICRO NUMBER: [id number]  Final   SPECIMEN QUALITY: ADEQUATE  Final  Sample Source URINE  Final   STATUS: FINAL  Final   ISOLATE 1: Escherichia coli (A)  Final      Susceptibility   Escherichia coli - URINE CULTURE, REFLEX    AMOX/CLAVULANIC >=32 Resistant     AMPICILLIN >=32 Resistant     AMPICILLIN/SULBACTAM 16 Intermediate     CEFAZOLIN* <=4 Not Reportable      * For infections other than uncomplicated UTIcaused by E. coli, K. pneumoniae or P. mirabilis:Cefazolin is resistant if MIC > or = 8 mcg/mL.(Distinguishing susceptible versus intermediatefor isolates with MIC < or = 4 mcg/mL requiresadditional testing.)For uncomplicated UTI caused by E. coli,K. pneumoniae or P. mirabilis: Cefazolin issusceptible if MIC <32 mcg/mL and predictssusceptible to the oral agents cefaclor, cefdinir,cefpodoxime, cefprozil, cefuroxime, cephalexinand loracarbef.    CEFEPIME <=1 Sensitive     CEFTRIAXONE <=1 Sensitive     CIPROFLOXACIN <=0.25 Sensitive     LEVOFLOXACIN <=0.12 Sensitive     ERTAPENEM <=0.5 Sensitive     GENTAMICIN >=16 Resistant     IMIPENEM <=0.25 Sensitive      NITROFURANTOIN <=16 Sensitive     PIP/TAZO <=4 Sensitive     TOBRAMYCIN 2 Sensitive     TRIMETH/SULFA* >=320 Resistant      * For infections other than uncomplicated UTIcaused by E. coli, K. pneumoniae or P. mirabilis:Cefazolin is resistant if MIC > or = 8 mcg/mL.(Distinguishing susceptible versus intermediatefor isolates with MIC < or = 4 mcg/mL requiresadditional testing.)For uncomplicated UTI caused by E. coli,K. pneumoniae or P. mirabilis: Cefazolin issusceptible if MIC <32 mcg/mL and predictssusceptible to the oral agents cefaclor, cefdinir,cefpodoxime, cefprozil, cefuroxime, cephalexinand loracarbef.Legend:S = Susceptible  I = IntermediateR = Resistant  NS = Not susceptible* = Not tested  NR = Not reported**NN = See antimicrobic comments    Coagulation Studies: No results for input(s): LABPROT, INR in the last 72 hours.  Urinalysis:  Recent Labs  Lab 11/07/18 0325  COLORURINE STRAW*  LABSPEC 1.004*  PHURINE 6.0  GLUCOSEU NEGATIVE  HGBUR NEGATIVE  BILIRUBINUR NEGATIVE  KETONESUR NEGATIVE  PROTEINUR NEGATIVE  NITRITE NEGATIVE  LEUKOCYTESUR TRACE*    Lipid Panel:     Component Value Date/Time   CHOL 103 11/07/2018 0317   CHOL 136 10/21/2018 1039   TRIG 94 11/07/2018 0317   HDL 32 (L) 11/07/2018 0317   HDL 34 (L) 10/21/2018 1039   CHOLHDL 3.2 11/07/2018 0317   VLDL 19 11/07/2018 0317   LDLCALC 52 11/07/2018 0317   LDLCALC 84 10/21/2018 1039    HgbA1C:  Lab Results  Component Value Date   HGBA1C 7.1 (H) 11/07/2018    Urine Drug Screen:  No results found for: LABOPIA, COCAINSCRNUR, LABBENZ, AMPHETMU, THCU, LABBARB  Alcohol Level: No results for input(s): ETH in the last 168 hours.  Other results: EKG: normal EKG, normal sinus rhythm, unchanged from previous tracings.  Imaging: Ct Head Wo Contrast  Result Date: 11/06/2018 CLINICAL DATA:  Initial evaluation for acute left arm numbness. EXAM: CT HEAD WITHOUT CONTRAST TECHNIQUE: Contiguous axial images were  obtained from the base of the skull through the vertex without intravenous contrast. COMPARISON:  Prior CT from 10/27/2015. FINDINGS: Brain: Generalized age-related cerebral atrophy with moderate chronic microvascular ischemic disease. Mineralization within the globus pallidum bilaterally. No acute intracranial hemorrhage. No acute large vessel territory infarct. No mass lesion, midline shift or mass effect. No hydrocephalus. No extra-axial fluid collection. Vascular: No hyperdense vessel. Scattered vascular calcifications noted within the carotid siphons. Skull: Scalp soft tissues within normal limits.  Calvarium  intact. Sinuses/Orbits: Globes and orbital soft tissues within normal limits. Small retention cyst noted within the right sphenoid sinus. Paranasal sinuses are otherwise clear. No mastoid effusion. Other: None. IMPRESSION: 1. No acute intracranial abnormality. 2. Generalized age-related cerebral atrophy with moderate chronic microvascular ischemic disease. Electronically Signed   By: Jeannine Boga M.D.   On: 11/06/2018 22:34   Mr Brain Wo Contrast  Result Date: 11/07/2018 CLINICAL DATA:  LEFT arm numbness. History of breast cancer, hypertension, hyperlipidemia. EXAM: MRI HEAD WITHOUT CONTRAST MRA HEAD WITHOUT CONTRAST TECHNIQUE: Multiplanar, multiecho pulse sequences of the brain and surrounding structures were obtained without intravenous contrast. Angiographic images of the head were obtained using MRA technique without contrast. COMPARISON:  CT HEAD November 06, 2018 FINDINGS: MRI HEAD FINDINGS INTRACRANIAL CONTENTS: No reduced diffusion to suggest acute ischemia. No susceptibility artifact to suggest hemorrhage. No parenchymal brain volume loss for age. No hydrocephalus. Patchy to confluent supratentorial and pontine white matter FLAIR T2 hyperintensities. 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 No suspicious parenchymal signal, masses, mass effect. No abnormal extra-axial  fluid collections. No extra-axial masses. VASCULAR: Normal major intracranial vascular flow voids present at skull base. SKULL AND UPPER CERVICAL SPINE: No abnormal sellar expansion. No suspicious calvarial bone marrow signal. Craniocervical junction maintained. SINUSES/ORBITS: The mastoid air-cells and included paranasal sinuses are well-aerated.The included ocular globes and orbital contents are non-suspicious. OTHER: None. MRA HEAD FINDINGS ANTERIOR CIRCULATION: Normal flow related enhancement of the included cervical, petrous, cavernous and supraclinoid internal carotid arteries. Patent anterior communicating artery. Patent anterior and middle cerebral arteries. No large vessel occlusion, flow limiting stenosis, or aneurysm. POSTERIOR CIRCULATION: Codominant vertebral arteries. Vertebrobasilar arteries are patent, with normal flow related enhancement of the main branch vessels. Patent posterior cerebral arteries. Robust RIGHT posterior communicating artery present. No large vessel occlusion, flow limiting stenosis, or aneurysm. ANATOMIC VARIANTS: Azygos ACA. Source data and MIP images were reviewed. IMPRESSION: MRI HEAD: 1. No acute intracranial process. 2. Moderate chronic small vessel ischemic changes and old lacunar infarcts. MRA HEAD: 1. No emergent large vessel occlusion or flow-limiting stenosis. Electronically Signed   By: Elon Alas M.D.   On: 11/07/2018 01:57   US Carotid Bilateral (at [facility] Only)  Result Date: 11/07/2018 CLINICAL DATA:  78 year old female with left-sided numbness EXAM: BILATERAL CAROTID DUPLEX ULTRASOUND TECHNIQUE: Pearline Cables scale imaging, color Doppler and duplex ultrasound were performed of bilateral carotid and vertebral arteries in the neck. COMPARISON:  04/19/2012 FINDINGS: Criteria: Quantification of carotid stenosis is based on velocity parameters that correlate the residual internal carotid diameter with NASCET-based stenosis levels, using the diameter of the distal  internal carotid lumen as the denominator for stenosis measurement. The following velocity measurements were obtained: RIGHT ICA:  Systolic 89 cm/sec, Diastolic 32 cm/sec CCA:  80 cm/sec SYSTOLIC ICA/CCA RATIO:  1.1 ECA:  69 cm/sec LEFT ICA:  Systolic 71 cm/sec, Diastolic 23 cm/sec CCA:  73 cm/sec SYSTOLIC ICA/CCA RATIO:  1.0 ECA:  63 cm/sec Right Brachial SBP: Not acquired Left Brachial SBP: Not acquired RIGHT CAROTID ARTERY: No significant calcifications of the right common carotid artery. Intermediate waveform maintained. Heterogeneous and partially calcified plaque at the right carotid bifurcation. No significant lumen shadowing. Low resistance waveform of the right ICA. No significant tortuosity. RIGHT VERTEBRAL ARTERY: Antegrade flow with low resistance waveform. LEFT CAROTID ARTERY: No significant calcifications of the left common carotid artery. Intermediate waveform maintained. Heterogeneous and partially calcified plaque at the left carotid bifurcation without significant lumen shadowing. Low resistance waveform of the  left ICA. No significant tortuosity. LEFT VERTEBRAL ARTERY:  Antegrade flow with low resistance waveform. IMPRESSION: Color duplex indicates minimal heterogeneous and calcified plaque, with no hemodynamically significant stenosis by duplex criteria in the extracranial cerebrovascular circulation. Signed, Dulcy Fanny. Dellia Nims, RPVI [facility] Electronically Signed   By: Corrie Mckusick D.O.   On: 11/07/2018 09:15   Mr Jodene Nam Head/brain SJ Cm  Result Date: 11/07/2018 CLINICAL DATA:  LEFT arm numbness. History of breast cancer, hypertension, hyperlipidemia. EXAM: MRI HEAD WITHOUT CONTRAST MRA HEAD WITHOUT CONTRAST TECHNIQUE: Multiplanar, multiecho pulse sequences of the brain and surrounding structures were obtained without intravenous contrast. Angiographic images of the head were obtained using MRA technique without contrast.  COMPARISON:  CT HEAD November 06, 2018 FINDINGS: MRI HEAD FINDINGS INTRACRANIAL CONTENTS: No reduced diffusion to suggest acute ischemia. No susceptibility artifact to suggest hemorrhage. No parenchymal brain volume loss for age. No hydrocephalus. Patchy to confluent supratentorial and pontine white matter FLAIR T2 hyperintensities. 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 No suspicious parenchymal signal, masses, mass effect. No abnormal extra-axial fluid collections. No extra-axial masses. VASCULAR: Normal major intracranial vascular flow voids present at skull base. SKULL AND UPPER CERVICAL SPINE: No abnormal sellar expansion. No suspicious calvarial bone marrow signal. Craniocervical junction maintained. SINUSES/ORBITS: The mastoid air-cells and included paranasal sinuses are well-aerated.The included ocular globes and orbital contents are non-suspicious. OTHER: None. MRA HEAD FINDINGS ANTERIOR CIRCULATION: Normal flow related enhancement of the included cervical, petrous, cavernous and supraclinoid internal carotid arteries. Patent anterior communicating artery. Patent anterior and middle cerebral arteries. No large vessel occlusion, flow limiting stenosis, or aneurysm. POSTERIOR CIRCULATION: Codominant vertebral arteries. Vertebrobasilar arteries are patent, with normal flow related enhancement of the main branch vessels. Patent posterior cerebral arteries. Robust RIGHT posterior communicating artery present. No large vessel occlusion, flow limiting stenosis, or aneurysm. ANATOMIC VARIANTS: Azygos ACA. Source data and MIP images were reviewed. IMPRESSION: MRI HEAD: 1. No acute intracranial process. 2. Moderate chronic small vessel ischemic changes and old lacunar infarcts. MRA HEAD: 1. No emergent large vessel occlusion or flow-limiting stenosis. Electronically Signed   By: Elon Alas M.D.   On: 11/07/2018 01:57     Assessment/Plan:  78 y.o. female  with hx of HTN, breast CA, HLD  with hx of residual L sided weakness.  Pt states she was normal health until around 6 pm until noted to have transient 30 min episode of L sided numbness that started in all 5 fingers and moved up to her shoulder.  Symptoms have resolved and she is back to her baseline.  Pt was on ASA 48m daily at home and took extra 886mwhen symptoms started.    - No acute abnormalities on MRI/MRA - Was on ASA 8150maily please increase to 325m6mily on d/c  - appreciate assistance PT/OT - likely d/c today  - follow up Neurology as out pt when safe.   ZEYLLeotis Pain2/2020, 10:30 AM

## 2018-11-07 NOTE — Progress Notes (Signed)
Initial Nutrition Assessment  RD working remotely.  DOCUMENTATION CODES:   Obesity unspecified  INTERVENTION:  Provide Ensure Max Protein po BID, each supplement provides 150 kcal and 30 grams of protein.  NUTRITION DIAGNOSIS:   Predicted suboptimal nutrient intake related to decreased appetite as evidenced by (positive score on Malnutrition Screening Tool for decreased appetite/intake and weight loss).  GOAL:   Patient will meet greater than or equal to 90% of their needs  MONITOR:   PO intake, Supplement acceptance, Labs, Weight trends, I & O's  REASON FOR ASSESSMENT:   Malnutrition Screening Tool    ASSESSMENT:   78 year old female with PMHx of HTN, GERD, DM, HLD, hx hernia s/p repair, stage I ER/PR positive HER-2/neu negative s/p lumpectomy and adjuvant XRT admitted with left sided numbness with possible CVA.    Attempted to call patient in room but did not get an answer. Patient passed [place] last night. Now on regular texture diet with heart healthy/carbohydrate modified restrictions. Weight in chart appears stable.  Medications reviewed and include: Novolog 0-9 units TID, Novolog 0-5 units QHS, pantoprazole.  Labs reviewed: CBG 126-158, HgbA1c 7.1.  NUTRITION - FOCUSED PHYSICAL EXAM:  Unable to complete at this time.  Diet Order:   Diet Order            Diet heart healthy/carb modified Room service appropriate? Yes; Fluid consistency: Thin  Diet effective now             EDUCATION NEEDS:   No education needs have been identified at this time  Skin:  Skin Assessment: Reviewed RN Assessment  Last BM:  11/06/2018 per chart  Height:   Ht Readings from Last 1 Encounters:  11/07/18 '5\' 6"'  (1.676 m)   Weight:   Wt Readings from Last 1 Encounters:  11/07/18 92.5 kg   Ideal Body Weight:     BMI:  Body mass index is 32.93 kg/m.  Estimated Nutritional Needs:   Kcal:  1700-1900  Protein:  85-95 grams  Fluid:  1.7-1.9 L/day  Willey Blade, MS, RD, LDN Office: [phone] Pager: [phone] After Hours/Weekend Pager: [phone]

## 2018-11-07 NOTE — Progress Notes (Signed)
Pt is being discharged to home with her daughter. Pt  Aox4, VSS, pt does not c/o any pain at this time and NIH scale continues to be 0. AVS and RX was given and explained to the pt and daughter and they each verbalized understanding of all information.

## 2018-11-07 NOTE — Progress Notes (Signed)
*  PRELIMINARY RESULTS* Echocardiogram 2D Echocardiogram has been performed.  Sherrie Sport 11/07/2018, 9:39 AM

## 2018-11-07 NOTE — Discharge Summary (Signed)
[facility] [place] NAME: Leah Schaefer    MR#:  [id number]  DATE OF BIRTH:  August 01, 1941  DATE OF ADMISSION:  11/06/2018   ADMITTING PHYSICIAN: Lance Coon, MD  DATE OF DISCHARGE: 11/07/18  PRIMARY CARE PHYSICIAN: Jerrol Banana., MD   ADMISSION DIAGNOSIS:  Left arm numbness [R20.0] DISCHARGE DIAGNOSIS:  Principal Problem:   Left sided numbness Active Problems:   Carcinoma of upper-outer quadrant of right breast in female, estrogen receptor positive ([place])   Anxiety   Diabetes mellitus, type 2 (HCC)   Acid reflux   Essential (primary) hypertension   Hypercholesteremia  SECONDARY DIAGNOSIS:   Past Medical History:  Diagnosis Date   Breast cancer ([place])    Breast cancer of upper-inner quadrant of right female breast ([place]) 09/21/2015   T1bN0 " 57m; ER100%, PR 90%, HER-2/neu not overexpressed.   Cystitis    Diabetes mellitus without complication ([facility]    NO MEDS-DIET CONTROLLED   GERD (gastroesophageal reflux disease)    Hernia 2011   Hyperlipidemia    Hypertension    Malignant neoplasm of upper-inner quadrant of female breast ([facility] 08/17/2011   Left, T2 (2.3 cm) N0, triple negative. Chemotherapy/post wide excision whole breast radiation.   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 CT head and MRI brain were negative for acute abnormalities. Carotid dopplers were negative. ECHO was pending at the time of discharge. She was seen by neurology, who recommended discharge on aspirin '325mg'$  daily. Her lipitor dose was also increased to '40mg'$  daily. PT/OT did not recommend any additional follow-up.  DISCHARGE CONDITIONS:   TIA Hypertension Hyperlipidemia T2DM History of breast cancer CONSULTS OBTAINED:  Treatment Team:  ZLeotis Pain MD DRUG ALLERGIES:  No Known Allergies DISCHARGE MEDICATIONS:   Allergies as of 11/07/2018   No Known Allergies     Medication List    STOP taking these medications   aspirin EC 81 MG tablet Replaced by:  aspirin 325 MG tablet     TAKE these medications   Accu-Chek Aviva Plus w/Device Kit USE AS DIRECTED   Accu-Chek FastClix Lancets Misc CHECK SUGAR ONCE DAILY   accu-chek soft touch lancets Use as instructed   Accu-Chek SmartView Control Liqd   acetaminophen 500 MG tablet Commonly known as:  TYLENOL Take 500 mg by mouth every 6 (six) hours as needed for mild pain or headache.   acyclovir 200 MG capsule Commonly known as:  ZOVIRAX TAKE 1 CAPSULE EVERY DAY   aspirin 325 MG tablet Commonly known as:  Bayer Aspirin Take 1 tablet (325 mg total) by mouth daily for 30 days. Replaces:  aspirin EC 81 MG tablet   atorvastatin 40 MG tablet Commonly known as:  LIPITOR Take 1 tablet (40 mg total) by mouth every evening. What changed:    medication strength  how much to take   B-D SINGLE USE SWABS REGULAR Pads CHECK SUGAR ONCE DAILY   bimatoprost 0.01 % Soln Commonly known as:  LUMIGAN Place 1 drop into both eyes at bedtime.   Calcium 600+D3 600-800 MG-UNIT Tabs Generic drug:  Calcium Carb-Cholecalciferol Take 1 tablet by mouth  2 (two) times daily.   cholecalciferol 1000 units tablet Commonly known as:  VITAMIN D Take 1,000 Units by mouth daily.   escitalopram 20 MG tablet Commonly known as:  LEXAPRO TAKE 1 TABLET EVERY DAY (alternates between this and 2 10 mg tablets (total '20mg'$ ) daily) What changed:    when to take this  additional instructions   gabapentin 300 MG capsule Commonly known as:  NEURONTIN Take 2 capsules (600 mg total) by mouth at bedtime.   glucose blood test strip Commonly known as:  Accu-Chek SmartView 1 each by  Other route daily. Use as instructed   latanoprost 0.005 % ophthalmic solution Commonly known as:  XALATAN Place 1 drop into both eyes at bedtime.   letrozole 2.5 MG tablet Commonly known as:  FEMARA Take 1 tablet (2.5 mg total) by mouth daily.   meclizine 25 MG tablet Commonly known as:  ANTIVERT Take 0.5 tablets (12.5 mg total) by mouth 3 (three) times daily as needed for dizziness.   meloxicam 15 MG tablet Commonly known as:  MOBIC TAKE 1 TABLET EVERY DAY   metFORMIN 500 MG tablet Commonly known as:  GLUCOPHAGE TAKE 1 TABLET BY MOUTH TWICE DAILY WITH MEALS   metoprolol tartrate 25 MG tablet Commonly known as:  LOPRESSOR TAKE 1/2 TABLET TWICE DAILY   multivitamin with minerals Tabs tablet Take 1 tablet by mouth daily.   omega-3 acid ethyl esters 1 g capsule Commonly known as:  LOVAZA Take 3 g by mouth daily.   pantoprazole 40 MG tablet Commonly known as:  PROTONIX TAKE 1 TABLET TWICE DAILY   pyridoxine 100 MG tablet Commonly known as:  B-6 Take 100 mg by mouth daily.   vitamin B-12 500 MCG tablet Commonly known as:  CYANOCOBALAMIN Take 500 mcg by mouth daily.   vitamin C 500 MG tablet Commonly known as:  ASCORBIC ACID Take 1,000 mg by mouth daily.   vitamin E 400 UNIT capsule Take 400 Units by mouth daily.        DISCHARGE INSTRUCTIONS:  1. Increase aspirin to '325mg'$  daily per neuro recommendations 2. Increase lipitor to '40mg'$  daily 3. F/u with neurology as an outpatient DIET:  Cardiac diet and Diabetic diet DISCHARGE CONDITION:  Stable ACTIVITY:  Activity as tolerated OXYGEN:  Home Oxygen: No.  Oxygen Delivery: room air DISCHARGE LOCATION:  home   If you experience worsening of your admission symptoms, develop shortness of breath, life threatening emergency, suicidal or homicidal thoughts you must seek medical attention immediately by calling 911 or calling your MD immediately  if symptoms less severe.  You Must read complete  instructions/literature along with all the possible adverse reactions/side effects for all the Medicines you take and that have been prescribed to you. Take any new Medicines after you have completely understood and accpet all the possible adverse reactions/side effects.   Please note  You were cared for by a hospitalist during your hospital stay. If you have any questions about your discharge medications or the care you received while you were in the hospital after you are discharged, you can call the unit and asked to speak with the hospitalist on call if the hospitalist that took care of you is not available. Once you are discharged, your primary care physician will handle any further medical issues. Please note that NO REFILLS for any discharge medications will be authorized once you are discharged, as it is imperative that you return to your primary care physician (or establish a relationship with a  primary care physician if you do not have one) for your aftercare needs so that they can reassess your need for medications and monitor your lab values.    On the day of Discharge:  VITAL SIGNS:  Blood pressure (!) 140/94, pulse 89, temperature 98.7 F (37.1 C), temperature source Oral, resp. rate 16, height '5\' 6"'$  (1.676 m), weight 92.5 kg, SpO2 92 %. PHYSICAL EXAMINATION:  GENERAL:  78 y.o.-year-old patient lying in the bed with no acute distress.  EYES: Pupils equal, round, reactive to light and accommodation. No scleral icterus. Extraocular muscles intact.  HEENT: Head atraumatic, normocephalic. Oropharynx and nasopharynx clear.  NECK:  Supple, no jugular venous distention. No thyroid enlargement, no tenderness.  LUNGS: Normal breath sounds bilaterally, no wheezing, rales,rhonchi or crepitation. No use of accessory muscles of respiration.  CARDIOVASCULAR: RRR, S1, S2 normal. No murmurs, rubs, or gallops.  ABDOMEN: Soft, non-tender, non-distended. Bowel sounds present. No organomegaly or mass.    EXTREMITIES: No pedal edema, cyanosis, or clubbing.  NEUROLOGIC: Cranial nerves II through XII are intact. Muscle strength 5/5 in all extremities. Sensation intact. Normal finger-to-nose testing. Gait not checked.  PSYCHIATRIC: The patient is alert and oriented x 3.  SKIN: No obvious rash, lesion, or ulcer.  DATA REVIEW:   CBC Recent Labs  Lab 11/07/18 0317  WBC 6.7  HGB 13.5  HCT 41.1  PLT 233    Chemistries  Recent Labs  Lab 11/06/18 2132 11/07/18 0317  NA 140  --   K 3.9  --   CL 105  --   CO2 27  --   GLUCOSE 119*  --   BUN 10  --   CREATININE 0.93 0.79  CALCIUM 9.5  --      Microbiology Results  Results for orders placed or performed in visit on 06/14/17  Urine Culture     Status: Abnormal   Collection Time: 06/14/17 12:21 PM  Result Value Ref Range Status   MICRO NUMBER: [id number]  Final   SPECIMEN QUALITY: ADEQUATE  Final   Sample Source URINE  Final   STATUS: FINAL  Final   ISOLATE 1: Escherichia coli (A)  Final      Susceptibility   Escherichia coli - URINE CULTURE, REFLEX    AMOX/CLAVULANIC >=32 Resistant     AMPICILLIN >=32 Resistant     AMPICILLIN/SULBACTAM 16 Intermediate     CEFAZOLIN* <=4 Not Reportable      * For infections other than uncomplicated UTIcaused by E. coli, K. pneumoniae or P. mirabilis:Cefazolin is resistant if MIC > or = 8 mcg/mL.(Distinguishing susceptible versus intermediatefor isolates with MIC < or = 4 mcg/mL requiresadditional testing.)For uncomplicated UTI caused by E. coli,K. pneumoniae or P. mirabilis: Cefazolin issusceptible if MIC <32 mcg/mL and predictssusceptible to the oral agents cefaclor, cefdinir,cefpodoxime, cefprozil, cefuroxime, cephalexinand loracarbef.    CEFEPIME <=1 Sensitive     CEFTRIAXONE <=1 Sensitive     CIPROFLOXACIN <=0.25 Sensitive     LEVOFLOXACIN <=0.12 Sensitive     ERTAPENEM <=0.5 Sensitive     GENTAMICIN >=16 Resistant     IMIPENEM <=0.25 Sensitive     NITROFURANTOIN <=16 Sensitive      PIP/TAZO <=4 Sensitive     TOBRAMYCIN 2 Sensitive     TRIMETH/SULFA* >=320 Resistant      * For infections other than uncomplicated UTIcaused by E. coli, K. pneumoniae or P. mirabilis:Cefazolin is resistant if MIC > or = 8 mcg/mL.(Distinguishing susceptible versus intermediatefor isolates with MIC < or = 4 mcg/mL  requiresadditional testing.)For uncomplicated UTI caused by E. coli,K. pneumoniae or P. mirabilis: Cefazolin issusceptible if MIC <32 mcg/mL and predictssusceptible to the oral agents cefaclor, cefdinir,cefpodoxime, cefprozil, cefuroxime, cephalexinand loracarbef.Legend:S = Susceptible  I = IntermediateR = Resistant  NS = Not susceptible* = Not tested  NR = Not reported**NN = See antimicrobic comments    RADIOLOGY:  Ct Head Wo Contrast  Result Date: 11/06/2018 CLINICAL DATA:  Initial evaluation for acute left arm numbness. EXAM: CT HEAD WITHOUT CONTRAST TECHNIQUE: Contiguous axial images were obtained from the base of the skull through the vertex without intravenous contrast. COMPARISON:  Prior CT from 10/27/2015. FINDINGS: Brain: Generalized age-related cerebral atrophy with moderate chronic microvascular ischemic disease. Mineralization within the globus pallidum bilaterally. No acute intracranial hemorrhage. No acute large vessel territory infarct. No mass lesion, midline shift or mass effect. No hydrocephalus. No extra-axial fluid collection. Vascular: No hyperdense vessel. Scattered vascular calcifications noted within the carotid siphons. Skull: Scalp soft tissues within normal limits.  Calvarium intact. Sinuses/Orbits: Globes and orbital soft tissues within normal limits. Small retention cyst noted within the right sphenoid sinus. Paranasal sinuses are otherwise clear. No mastoid effusion. Other: None. IMPRESSION: 1. No acute intracranial abnormality. 2. Generalized age-related cerebral atrophy with moderate chronic microvascular ischemic disease. Electronically Signed   By: Jeannine Boga M.D.   On: 11/06/2018 22:34   Mr Brain Wo Contrast  Result Date: 11/07/2018 CLINICAL DATA:  LEFT arm numbness. History of breast cancer, hypertension, hyperlipidemia. EXAM: MRI HEAD WITHOUT CONTRAST MRA HEAD WITHOUT CONTRAST TECHNIQUE: Multiplanar, multiecho pulse sequences of the brain and surrounding structures were obtained without intravenous contrast. Angiographic images of the head were obtained using MRA technique without contrast. COMPARISON:  CT HEAD November 06, 2018 FINDINGS: MRI HEAD FINDINGS INTRACRANIAL CONTENTS: No reduced diffusion to suggest acute ischemia. No susceptibility artifact to suggest hemorrhage. No parenchymal brain volume loss for age. No hydrocephalus. Patchy to confluent supratentorial and pontine white matter FLAIR T2 hyperintensities. 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 No suspicious parenchymal signal, masses, mass effect. No abnormal extra-axial fluid collections. No extra-axial masses. VASCULAR: Normal major intracranial vascular flow voids present at skull base. SKULL AND UPPER CERVICAL SPINE: No abnormal sellar expansion. No suspicious calvarial bone marrow signal. Craniocervical junction maintained. SINUSES/ORBITS: The mastoid air-cells and included paranasal sinuses are well-aerated.The included ocular globes and orbital contents are non-suspicious. OTHER: None. MRA HEAD FINDINGS ANTERIOR CIRCULATION: Normal flow related enhancement of the included cervical, petrous, cavernous and supraclinoid internal carotid arteries. Patent anterior communicating artery. Patent anterior and middle cerebral arteries. No large vessel occlusion, flow limiting stenosis, or aneurysm. POSTERIOR CIRCULATION: Codominant vertebral arteries. Vertebrobasilar arteries are patent, with normal flow related enhancement of the main branch vessels. Patent posterior cerebral arteries. Robust RIGHT posterior communicating artery present. No large vessel occlusion, flow  limiting stenosis, or aneurysm. ANATOMIC VARIANTS: Azygos ACA. Source data and MIP images were reviewed. IMPRESSION: MRI HEAD: 1. No acute intracranial process. 2. Moderate chronic small vessel ischemic changes and old lacunar infarcts. MRA HEAD: 1. No emergent large vessel occlusion or flow-limiting stenosis. Electronically Signed   By: Elon Alas M.D.   On: 11/07/2018 01:57   US Carotid Bilateral (at [facility] Only)  Result Date: 11/07/2018 CLINICAL DATA:  78 year old female with left-sided numbness EXAM: BILATERAL CAROTID DUPLEX ULTRASOUND TECHNIQUE: Pearline Cables scale imaging, color Doppler and duplex ultrasound were performed of bilateral carotid and vertebral arteries in the neck. COMPARISON:  04/19/2012 FINDINGS: Criteria: Quantification of carotid stenosis is based on  velocity parameters that correlate the residual internal carotid diameter with NASCET-based stenosis levels, using the diameter of the distal internal carotid lumen as the denominator for stenosis measurement. The following velocity measurements were obtained: RIGHT ICA:  Systolic 89 cm/sec, Diastolic 32 cm/sec CCA:  80 cm/sec SYSTOLIC ICA/CCA RATIO:  1.1 ECA:  69 cm/sec LEFT ICA:  Systolic 71 cm/sec, Diastolic 23 cm/sec CCA:  73 cm/sec SYSTOLIC ICA/CCA RATIO:  1.0 ECA:  63 cm/sec Right Brachial SBP: Not acquired Left Brachial SBP: Not acquired RIGHT CAROTID ARTERY: No significant calcifications of the right common carotid artery. Intermediate waveform maintained. Heterogeneous and partially calcified plaque at the right carotid bifurcation. No significant lumen shadowing. Low resistance waveform of the right ICA. No significant tortuosity. RIGHT VERTEBRAL ARTERY: Antegrade flow with low resistance waveform. LEFT CAROTID ARTERY: No significant calcifications of the left common carotid artery. Intermediate waveform maintained. Heterogeneous and partially calcified plaque at the left carotid bifurcation without significant lumen shadowing.  Low resistance waveform of the left ICA. No significant tortuosity. LEFT VERTEBRAL ARTERY:  Antegrade flow with low resistance waveform. IMPRESSION: Color duplex indicates minimal heterogeneous and calcified plaque, with no hemodynamically significant stenosis by duplex criteria in the extracranial cerebrovascular circulation. Signed, Dulcy Fanny. Dellia Nims, RPVI [facility] Electronically Signed   By: Corrie Mckusick D.O.   On: 11/07/2018 09:15   Mr Jodene Nam Head/brain JJ Cm  Result Date: 11/07/2018 CLINICAL DATA:  LEFT arm numbness. History of breast cancer, hypertension, hyperlipidemia. EXAM: MRI HEAD WITHOUT CONTRAST MRA HEAD WITHOUT CONTRAST TECHNIQUE: Multiplanar, multiecho pulse sequences of the brain and surrounding structures were obtained without intravenous contrast. Angiographic images of the head were obtained using MRA technique without contrast. COMPARISON:  CT HEAD November 06, 2018 FINDINGS: MRI HEAD FINDINGS INTRACRANIAL CONTENTS: No reduced diffusion to suggest acute ischemia. No susceptibility artifact to suggest hemorrhage. No parenchymal brain volume loss for age. No hydrocephalus. Patchy to confluent supratentorial and pontine white matter FLAIR T2 hyperintensities. 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 No suspicious parenchymal signal, masses, mass effect. No abnormal extra-axial fluid collections. No extra-axial masses. VASCULAR: Normal major intracranial vascular flow voids present at skull base. SKULL AND UPPER CERVICAL SPINE: No abnormal sellar expansion. No suspicious calvarial bone marrow signal. Craniocervical junction maintained. SINUSES/ORBITS: The mastoid air-cells and included paranasal sinuses are well-aerated.The included ocular globes and orbital contents are non-suspicious. OTHER: None. MRA HEAD FINDINGS ANTERIOR CIRCULATION: Normal flow related enhancement of the included cervical, petrous, cavernous and  supraclinoid internal carotid arteries. Patent anterior communicating artery. Patent anterior and middle cerebral arteries. No large vessel occlusion, flow limiting stenosis, or aneurysm. POSTERIOR CIRCULATION: Codominant vertebral arteries. Vertebrobasilar arteries are patent, with normal flow related enhancement of the main branch vessels. Patent posterior cerebral arteries. Robust RIGHT posterior communicating artery present. No large vessel occlusion, flow limiting stenosis, or aneurysm. ANATOMIC VARIANTS: Azygos ACA. Source data and MIP images were reviewed. IMPRESSION: MRI HEAD: 1. No acute intracranial process. 2. Moderate chronic small vessel ischemic changes and old lacunar infarcts. MRA HEAD: 1. No emergent large vessel occlusion or flow-limiting stenosis. Electronically Signed   By: Elon Alas M.D.   On: 11/07/2018 01:57     Management plans discussed with the patient, family and they are in agreement.  CODE STATUS: Full Code   TOTAL TIME TAKING CARE OF THIS PATIENT: 45 minutes.    Berna Spare Annya Lizana M.D on 11/07/2018 at 11:13 AM  Between 7am to 6pm - Pager - [phone]  After 6pm go to [email or web address] - password [email or web address] [facility]  [facility]  Office  [phone]  CC: Primary care physician; Jerrol Banana., MD   Note: This dictation was prepared with Dragon dictation along with smaller phrase technology. Any transcriptional errors that result from this process are unintentional.

## 2018-11-07 NOTE — TOC Transition Note (Signed)
Transition of Care [facility]) - CM/SW Discharge Note   Patient Details  Name: Leah Schaefer MRN: [id number] Date of Birth: 1941/01/05  [facility] CM/SW Contact:  Shelbie Ammons, RN Phone Number: 11/07/2018, 11:30 AM   Clinical Narrative:   No follow-up needs per Occupational and physical therapy.  Possible discharge to home later this day.    Final next level of care: (undecided) Barriers to Discharge: (left sided weakness)   Patient Goals and CMS Choice Patient states their goals for this hospitalization and ongoing recovery are:: (Wants to go home) CMS Medicare.gov Compare Post Acute Care list provided to:: Patient Choice offered to / list presented to : Patient  Discharge Placement Home.                        Discharge Plan and Services In-house Referral: NA Discharge Planning Services: CM Consult Post Acute Care Choice: (undecided)          DME Arranged: N/A DME Agency: (undecided) HH Arranged: (not yet) HH Agency: NA   Social Determinants of Health (SDOH) Interventions Supportive family     Readmission Risk Interventions No flowsheet data found.

## 2018-11-07 NOTE — Care Management Obs Status (Signed)
[place] NOTIFICATION   Patient Details  Name: Leah Schaefer MRN: [id number] Date of Birth: 12-03-1940   Medicare Observation Status Notification Given:  Yes; explained to Ms. [name], RN 11/07/2018, 10:21 AM

## 2018-11-07 NOTE — Evaluation (Signed)
Occupational Therapy Evaluation Patient Details Name: Leah Schaefer MRN: [id number] DOB: [birth year] Today's Date: 11/07/2018    History of Present Illness 78yo female came to ED with L sided numbness. PMHx includes breast cancer, DMII, HTN, HLD, GERD. 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    Clinical Impression   Pt seen for OT evaluation this date. Prior to hospital admission, pt was independent in all aspects of ADL/IADL (occasionally using husband's SPC in the home) and denies falls history in past 12 months. Pt lives with her spouse in a 1 story home. Currently pt reporting symptoms have resolved. Pt demonstrates baseline independence to perform ADL and mobility tasks and no strength (mild L side weakness at baseline, 4+/5 vs 5/5 R side), sensory, coordination, cognitive, or visual deficits appreciated with assessment. No skilled OT needs identified. Will sign off. Please re-consult if additional OT needs arise.    Follow Up Recommendations  No OT follow up    Equipment Recommendations  None recommended by OT    Recommendations for Other Services       Precautions / Restrictions Precautions Precautions: None Restrictions Weight Bearing Restrictions: No      Mobility Bed Mobility Overal bed mobility: Modified Independent                Transfers Overall transfer level: Modified independent               General transfer comment: supervision but able to perform without assist    Balance Overall balance assessment: Mild deficits observed, not formally tested                                         ADL either performed or assessed with clinical judgement   ADL Overall ADL's : At baseline                                       General ADL Comments: pt indep with ADL, supervision level at time of evaluation, but does not require supervision     Vision Baseline Vision/History: Wears glasses Wears  Glasses: At all times Patient Visual Report: No change from baseline       Perception     Praxis      Pertinent Vitals/Pain Pain Assessment: No/denies pain     Hand Dominance Left   Extremity/Trunk Assessment Upper Extremity Assessment Upper Extremity Assessment: RUE deficits/detail;LUE deficits/detail RUE Deficits / Details: WFL RUE Sensation: WNL RUE Coordination: WNL LUE Deficits / Details: 4+/5, coordination WFL, sensation intact LUE Sensation: WNL LUE Coordination: WNL   Lower Extremity Assessment Lower Extremity Assessment: LLE deficits/detail;RLE deficits/detail RLE Deficits / Details: WFL LLE Deficits / Details: 4+/5, sensation intact LLE Sensation: WNL LLE Coordination: WNL   Cervical / Trunk Assessment Cervical / Trunk Assessment: Normal   Communication Communication Communication: No difficulties   Cognition Arousal/Alertness: Awake/alert Behavior During Therapy: WFL for tasks assessed/performed Overall Cognitive Status: Within Functional Limits for tasks assessed                                     General Comments       Exercises Other Exercises Other Exercises: pt educated in s/s stroke - pt verbalized understanding  Shoulder Instructions      Home Living Family/patient expects to be discharged to:: Private residence Living Arrangements: Spouse/significant other Available Help at Discharge: Family;Available 24 hours/day(daughter lives next door) Type of Home: House Home Access: Stairs to enter [employer] of Steps: back steps 4 Entrance Stairs-Rails: Left Home Layout: One level     Bathroom Shower/Tub: Teacher, early years/pre: Handicapped height     Home Equipment: Grab bars - tub/shower;Shower seat;Cane - single point   Additional Comments: pt uses husband's SPC      Prior Functioning/Environment Level of Independence: Independent with assistive device(s);Needs assistance  Gait / Transfers  Assistance Needed: pt reports "sometimes"  using SPC inside the house ADL's / Homemaking Assistance Needed: spouse assists with tub shower transfers, indep with ADL otherwise, shares cooking/cleaning duties with spouse   Comments: no falls in 12 mo        OT Problem List: Impaired sensation      OT Treatment/Interventions:      OT Goals(Current goals can be found in the care plan section) Acute Rehab OT Goals Patient Stated Goal: go home OT Goal Formulation: All assessment and education complete, DC therapy  OT Frequency:     Barriers to D/C:            Co-evaluation              AM-PAC OT "6 Clicks" Daily Activity     Outcome Measure Help from another person eating meals?: None Help from another person taking care of personal grooming?: None Help from another person toileting, which includes using toliet, bedpan, or urinal?: None Help from another person bathing (including washing, rinsing, drying)?: None Help from another person to put on and taking off regular upper body clothing?: None Help from another person to put on and taking off regular lower body clothing?: None 6 Click Score: 24   End of Session Equipment Utilized During Treatment: Gait belt  Activity Tolerance: Patient tolerated treatment well Patient left: in bed;with call bell/phone within reach  OT Visit Diagnosis: Other abnormalities of gait and mobility (R26.89)                Time: 1010-1028 OT Time Calculation (min): 18 min Charges:  OT General Charges $OT Visit: 1 Visit OT Evaluation $OT Eval Low Complexity: 1 Low  Jeni Salles, MPH, MS, OTR/L ascom [phone] 11/07/18, 10:45 AM

## 2018-11-07 NOTE — TOC Initial Note (Signed)
Transition of Care [facility]) - Initial/Assessment Note    Patient Details  Name: Leah Schaefer MRN: [id number] Date of Birth: 12-06-1940  Transition of Care [facility]) CM/SW Contact:    Shelbie Ammons, RN Phone Number: 11/07/2018, 10:47 AM  Clinical Narrative:                   Expected Discharge Plan: Home/Self Care Barriers to Discharge: (left sided weakness)   Patient Goals and CMS Choice Patient states their goals for this hospitalization and ongoing recovery are:: (Wants to go home) CMS Medicare.gov Compare Post Acute Care list provided to:: Patient Choice offered to / list presented to : Patient  Expected Discharge Plan and Services Expected Discharge Plan: Home/Self Care In-house Referral: NA Discharge Planning Services: CM Consult Post Acute Care Choice: (undecided) Living arrangements for the past 2 months: Single Family Home                 DME Arranged: N/A DME Agency: (undecided) HH Arranged: (not yet) [place] Agency: NA  Prior Living Arrangements/Services Living arrangements for the past 2 months: Single Family Home Lives with:: Spouse Patient language and need for interpreter reviewed:: No Do you feel safe going back to the place where you live?: Yes      Need for Family Participation in Patient Care: No (Comment) Care giver support system in place?: Yes (comment) Current home services: (none in place) Criminal Activity/Legal Involvement Pertinent to Current Situation/Hospitalization: No - Comment as needed  Activities of Daily Living Home Assistive Devices/Equipment: None ADL Screening (condition at time of admission) Patient's cognitive ability adequate to safely complete daily activities?: Yes Is the patient deaf or have difficulty hearing?: No Does the patient have difficulty seeing, even when wearing glasses/contacts?: No Does the patient have difficulty concentrating, remembering, or making decisions?: No Patient able to express need for assistance with  ADLs?: No Does the patient have difficulty dressing or bathing?: No Independently performs ADLs?: Yes (appropriate for developmental age) Does the patient have difficulty walking or climbing stairs?: No Weakness of Legs: None Weakness of Arms/Hands: Left  Permission Sought/Granted Permission sought to share information with : Case Manager Permission granted to share information with : Yes, Verbal Permission Granted              Emotional Assessment Appearance:: Appears stated age Attitude/Demeanor/Rapport: (calm) Affect (typically observed): Accepting Orientation: : Oriented to Self, Oriented to Place, Oriented to  Time, Oriented to Situation Alcohol / Substance Use: Not Applicable Psych Involvement: No (comment)  Admission diagnosis:  Left arm numbness [R20.0] Patient Active Problem List   Diagnosis Date Noted  . Left sided numbness 11/06/2018  . 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 Umbilical pain 24/58/0998  . Breast mass, left 04/07/2016  . Controlled type 2 diabetes mellitus without complication ([place]) 33/82/5053  . Allergic rhinitis 03/30/2015  . Anxiety 03/30/2015  . Carotid arterial disease ([place]) 03/30/2015  . Diabetes mellitus, type 2 ([place]) 03/30/2015  . Essential (primary) hypertension 03/30/2015  . Hypercholesteremia 03/30/2015  . Left leg pain 03/30/2015  . LBP (low back pain) 03/30/2015  . Neuropathic pain 03/30/2015  . Burning or prickling sensation 03/30/2015  . Neuralgia neuritis, sciatic nerve 03/30/2015  . Skin cyst 11/24/2014  . Carcinoma of upper-outer quadrant of right breast in female, estrogen receptor positive ([place]) 08/17/2011  . Allergic reaction 11/29/2009  . Adaptation reaction 04/30/2009  . Acid reflux 04/30/2009  . Stricture and stenosis of cervix 01/28/2009   PCP:  [name]  Kaylyn Lim., MD Pharmacy:   [facility] [address] [place] - [place] [address] [place] [address] Phone: [phone] Fax:  [facility] [place], [place] [address] Phone: [phone] Fax: [phone]  [place] [address] [place]), [place] - [place]) [place] [address] Phone: [phone] Fax: [phone]     Social Determinants of Health (SDOH) Interventions    Readmission Risk Interventions No flowsheet data found.

## 2018-11-07 NOTE — Progress Notes (Signed)
SLP Cancellation Note  Patient Details Name: Leah Schaefer MRN: [id number] DOB: [birth year]   Cancelled treatment:       Reason Eval/Treat Not Completed: SLP screened, no needs identified, will sign off(chart reviewed; consulted NSG then met w/ pt). Pt denied any difficulty swallowing and is currently on a regular diet; tolerates swallowing pills w/ water per NSG. Pt conversed at conversational level w/ SLP w/out deficits noted; pt and NSG denied any speech-language deficits.  No further skilled ST services indicated as pt appears at her baseline. Pt agreed. NSG to reconsult if any change in status.     Orinda [place], MS, CCC-SLP Watson,Katherine 11/07/2018, 12:05 PM

## 2018-11-07 NOTE — Evaluation (Signed)
Physical Therapy Evaluation Patient Details Name: Leah Schaefer MRN: [id number] DOB: [birth year] Today's Date: 11/07/2018   History of Present Illness  78 yo female came to ED with L sided numbness. PMHx includes breast cancer, DMII, HTN, HLD, GERD. MRI (-) for acute infarcts, does show remote lacunar infarcts.   Clinical Impression  Prior to hospital admission, pt was independent (occasional use of SPC).  Pt lives with her husband.  Currently pt is independent with transfers and modified independent with ambulation (pt occasionally using SPC).  No loss of balance noted with ambulation and head turns R/L/up/down, increasing/decreasing speed, and turning and stopping.  Pt with baseline L sided mild weakness (no change from baseline); pt reporting new symptoms now resolved.  No acute PT needs identified.  Will complete PT order and discharge pt from PT in house.    Follow Up Recommendations No PT follow up    Equipment Recommendations  Cane(pt has cane at home already)    Recommendations for Other Services       Precautions / Restrictions Precautions Precautions: Fall Restrictions Weight Bearing Restrictions: No      Mobility  Bed Mobility Overal bed mobility: Modified Independent             General bed mobility comments: Semi-supine to/from sit without any noted difficulties  Transfers Overall transfer level: Independent Equipment used: None             General transfer comment: steady strong transfers noted  Ambulation/Gait Ambulation/Gait assistance: Modified independent (Device/Increase time) Gait Distance (Feet): 240 Feet Assistive device: None;Straight cane   Gait velocity: decreased mildly   General Gait Details: pt overall steady but occasionally reaching for railing in hallway so given SPC (pt using SPC intermittently but otherwise carrying SPC)  Stairs Stairs: Yes Stairs assistance: Modified independent (Device/Increase time) Stair Management: One  rail Right;Alternating pattern;Forwards Number of Stairs: 6 General stair comments: steady safe stairs navigation noted  Wheelchair Mobility    Modified Rankin (Stroke Patients Only)       Balance Overall balance assessment: Needs assistance Sitting-balance support: No upper extremity supported;Feet supported Sitting balance-Leahy Scale: Normal Sitting balance - Comments: steady sitting reaching outside BOS   Standing balance support: No upper extremity supported Standing balance-Leahy Scale: Normal Standing balance comment: steady standing making her bed                             Pertinent Vitals/Pain Pain Assessment: 0-10 Pain Score: 5  Pain Location: L side of neck Pain Descriptors / Indicators: Sore Pain Intervention(s): Limited activity within patient's tolerance;Monitored during session;Premedicated before session;Repositioned;Other (comment)(RN notified)  Vitals (HR and O2 on room air) stable and WFL throughout treatment session.    Home Living Family/patient expects to be discharged to:: Private residence Living Arrangements: Spouse/significant other Available Help at Discharge: Family;Available 24 hours/day(daughter lives next door) Type of Home: House Home Access: Stairs to enter Entrance Stairs-Rails: Left;Right;Can reach both Entrance Stairs-Number of Steps: back steps 4 Home Layout: One level Home Equipment: Grab bars - tub/shower;Shower seat;Cane - single point Additional Comments: pt uses husband's SPC occasionally    Prior Function Level of Independence: Independent with assistive device(s);Needs assistance   Gait / Transfers Assistance Needed: Pt reports "sometimes"  using SPC inside the house  ADL's / Homemaking Assistance Needed: spouse assists with tub shower transfers, indep with ADL otherwise, shares cooking/cleaning duties with spouse  Comments: no falls in 12 mo  Hand Dominance   Dominant Hand: Left    Extremity/Trunk  Assessment   Upper Extremity Assessment Upper Extremity Assessment: Defer to OT evaluation RUE Deficits / Details: WFL RUE Sensation: WNL RUE Coordination: WNL LUE Deficits / Details: 4+/5, coordination WFL, sensation intact LUE Sensation: WNL LUE Coordination: WNL    Lower Extremity Assessment Lower Extremity Assessment: LLE deficits/detail;RLE deficits/detail RLE Deficits / Details: WFL LLE Deficits / Details: grossly 4+/5 hip flexion, knee flexion/extension, and DF LLE Sensation: WNL LLE Coordination: WNL    Cervical / Trunk Assessment Cervical / Trunk Assessment: Normal  Communication   Communication: No difficulties  Cognition Arousal/Alertness: Awake/alert Behavior During Therapy: WFL for tasks assessed/performed Overall Cognitive Status: Within Functional Limits for tasks assessed                                        General Comments  Pt washed her hands at sink after returning from ambulating in hallway (steady without loss of balance).    Exercises    Assessment/Plan    PT Assessment Patent does not need any further PT services  PT Problem List         PT Treatment Interventions      PT Goals (Current goals can be found in the Care Plan section)  Acute Rehab PT Goals Patient Stated Goal: to go home PT Goal Formulation: With patient Time For Goal Achievement: 11/21/18 Potential to Achieve Goals: Good    Frequency     Barriers to discharge        Co-evaluation               AM-PAC PT "6 Clicks" Mobility  Outcome Measure Help needed turning from your back to your side while in a flat bed without using bedrails?: None Help needed moving from lying on your back to sitting on the side of a flat bed without using bedrails?: None Help needed moving to and from a bed to a chair (including a wheelchair)?: None Help needed standing up from a chair using your arms (e.g., wheelchair or bedside chair)?: None Help needed to walk in  hospital room?: None Help needed climbing 3-5 steps with a railing? : None 6 Click Score: 24    End of Session Equipment Utilized During Treatment: Gait belt Activity Tolerance: Patient tolerated treatment well Patient left: in bed;with call bell/phone within reach Nurse Communication: Mobility status;Precautions PT Visit Diagnosis: Hemiplegia and hemiparesis Hemiplegia - Right/Left: Left Hemiplegia - dominant/non-dominant: Dominant Hemiplegia - caused by: Unspecified    Time: 4235-3614 PT Time Calculation (min) (ACUTE ONLY): 24 min   Charges:   PT Evaluation $PT Eval Low Complexity: 1 Low         Irelynd Zumstein, PT 11/07/18, 1:44 PM [phone]

## 2018-11-13 ENCOUNTER — Other Ambulatory Visit: Payer: Self-pay

## 2018-11-13 ENCOUNTER — Encounter: Payer: Self-pay | Admitting: Family Medicine

## 2018-11-13 ENCOUNTER — Ambulatory Visit (INDEPENDENT_AMBULATORY_CARE_PROVIDER_SITE_OTHER): Payer: Medicare HMO | Admitting: Family Medicine

## 2018-11-13 VITALS — BP 120/72 | HR 74 | Temp 99.3°F | Resp 16 | Wt 205.0 lb

## 2018-11-13 DIAGNOSIS — E7800 Pure hypercholesterolemia, unspecified: Secondary | ICD-10-CM | POA: Diagnosis not present

## 2018-11-13 DIAGNOSIS — C50411 Malignant neoplasm of upper-outer quadrant of right female breast: Secondary | ICD-10-CM

## 2018-11-13 DIAGNOSIS — F419 Anxiety disorder, unspecified: Secondary | ICD-10-CM | POA: Diagnosis not present

## 2018-11-13 DIAGNOSIS — I10 Essential (primary) hypertension: Secondary | ICD-10-CM

## 2018-11-13 DIAGNOSIS — G459 Transient cerebral ischemic attack, unspecified: Secondary | ICD-10-CM | POA: Diagnosis not present

## 2018-11-13 DIAGNOSIS — Z170 Estrogen receptor positive status [ER+]: Secondary | ICD-10-CM | POA: Diagnosis not present

## 2018-11-13 DIAGNOSIS — E119 Type 2 diabetes mellitus without complications: Secondary | ICD-10-CM

## 2018-11-13 NOTE — Progress Notes (Signed)
Patient: Leah Schaefer Female    DOB: Jan 03, 1941   78 y.o.   MRN: [id number] Visit Date: 11/13/2018  Today's Provider: [name], MD   Chief Complaint  Patient presents with  . ER follow up   Subjective:     HPI   Follow Up ER Visit  Patient is here for ER follow up.  She was recently seen at [facility] for left sided weakness on 11/06/2018.  During the admission she had actually  complained of left arm numbness and tingling.  Symptoms have resolved.  She has had no palpitations or cardiac symptoms at all. Treatment for this included starting aspirin 348m daily and increase Lipitor to 458mdaily.  She reports good compliance with treatment. She reports this condition is Improved. She has not had any more episodes since she was seen in the ER last week.   No Known Allergies   Current Outpatient Medications:  .  ACCU-CHEK FASTCLIX LANCETS MISC, CHECK SUGAR ONCE DAILY , Disp: 102 each, Rfl: 3 .  acetaminophen (TYLENOL) 500 MG tablet, Take 500 mg by mouth every 6 (six) hours as needed for mild pain or headache. , Disp: , Rfl:  .  acyclovir (ZOVIRAX) 200 MG capsule, TAKE 1 CAPSULE EVERY DAY, Disp: 90 capsule, Rfl: 0 .  Alcohol Swabs (B-D SINGLE USE SWABS REGULAR) PADS, CHECK SUGAR ONCE DAILY, Disp: 100 each, Rfl: 3 .  aspirin (BAYER ASPIRIN) 325 MG tablet, Take 1 tablet (325 mg total) by mouth daily for 30 days., Disp: 30 tablet, Rfl: 0 .  atorvastatin (LIPITOR) 40 MG tablet, Take 1 tablet (40 mg total) by mouth every evening., Disp: 30 tablet, Rfl: 0 .  bimatoprost (LUMIGAN) 0.01 % SOLN, Place 1 drop into both eyes at bedtime. , Disp: , Rfl:  .  Blood Glucose Calibration (ACCU-CHEK SMARTVIEW CONTROL) LIQD, , Disp: , Rfl:  .  Blood Glucose Monitoring Suppl (ACCU-CHEK AVIVA PLUS) w/Device KIT, USE AS DIRECTED, Disp: 1 kit, Rfl: 0 .  Calcium Carb-Cholecalciferol (CALCIUM 600+D3) 600-800 MG-UNIT TABS, Take 1 tablet by mouth 2 (two) times daily., Disp: , Rfl:  .   cholecalciferol (VITAMIN D) 1000 units tablet, Take 1,000 Units by mouth daily., Disp: , Rfl:  .  cyanocobalamin 500 MCG tablet, Take 500 mcg by mouth daily., Disp: , Rfl:  .  escitalopram (LEXAPRO) 20 MG tablet, TAKE 1 TABLET EVERY DAY (alternates between this and 2 10 mg tablets (total 2055mdaily) (Patient taking differently: daily. ), Disp: 90 tablet, Rfl: 3 .  gabapentin (NEURONTIN) 300 MG capsule, Take 2 capsules (600 mg total) by mouth at bedtime., Disp: 180 capsule, Rfl: 3 .  glucose blood (ACCU-CHEK SMARTVIEW) test strip, 1 each by Other route daily. Use as instructed, Disp: 100 each, Rfl: 3 .  Lancets (ACCU-CHEK SOFT TOUCH) lancets, Use as instructed, Disp: 100 each, Rfl: 12 .  latanoprost (XALATAN) 0.005 % ophthalmic solution, Place 1 drop into both eyes at bedtime. , Disp: , Rfl:  .  letrozole (FEMARA) 2.5 MG tablet, Take 1 tablet (2.5 mg total) by mouth daily., Disp: 90 tablet, Rfl: 3 .  meclizine (ANTIVERT) 25 MG tablet, Take 0.5 tablets (12.5 mg total) by mouth 3 (three) times daily as needed for dizziness., Disp: 15 tablet, Rfl: 0 .  meloxicam (MOBIC) 15 MG tablet, TAKE 1 TABLET EVERY DAY, Disp: 90 tablet, Rfl: 0 .  metFORMIN (GLUCOPHAGE) 500 MG tablet, TAKE 1 TABLET BY MOUTH TWICE DAILY WITH MEALS, Disp: 60 tablet,  Rfl: 11 .  metoprolol tartrate (LOPRESSOR) 25 MG tablet, TAKE 1/2 TABLET TWICE DAILY, Disp: 90 tablet, Rfl: 3 .  Multiple Vitamin (MULTIVITAMIN WITH MINERALS) TABS tablet, Take 1 tablet by mouth daily., Disp: , Rfl:  .  omega-3 acid ethyl esters (LOVAZA) 1 g capsule, Take 3 g by mouth daily. , Disp: , Rfl:  .  pantoprazole (PROTONIX) 40 MG tablet, TAKE 1 TABLET TWICE DAILY, Disp: 180 tablet, Rfl: 3 .  pyridoxine (B-6) 100 MG tablet, Take 100 mg by mouth daily., Disp: , Rfl:  .  vitamin C (ASCORBIC ACID) 500 MG tablet, Take 1,000 mg by mouth daily., Disp: , Rfl:  .  vitamin E 400 UNIT capsule, Take 400 Units by mouth daily., Disp: , Rfl:   Review of Systems   Constitutional: Negative for activity change, appetite change, chills, diaphoresis, fatigue, fever and unexpected weight change.  Respiratory: Negative for cough and shortness of breath.   Cardiovascular: Negative for chest pain, palpitations and leg swelling.  Allergic/Immunologic: Negative.   Neurological: Negative for dizziness, light-headedness and headaches.  Psychiatric/Behavioral: Negative for agitation, self-injury and sleep disturbance. The patient is not nervous/anxious.     Social History   Tobacco Use  . Smoking status: Never Smoker  . Smokeless tobacco: Never Used  Substance Use Topics  . Alcohol use: No      Objective:   BP 120/72 (BP Location: Left Arm, Patient Position: Sitting, Cuff Size: Normal)   Pulse 74   Temp 99.3 F (37.4 C)   Resp 16   Wt 205 lb (93 kg)   SpO2 97%   BMI 33.09 kg/m  Vitals:   11/13/18 0920  BP: 120/72  Pulse: 74  Resp: 16  Temp: 99.3 F (37.4 C)  SpO2: 97%  Weight: 205 lb (93 kg)     Physical Exam Vitals signs reviewed.  Constitutional:      Appearance: She is well-developed.  HENT:     Head: Normocephalic and atraumatic.     Right Ear: External ear normal.     Left Ear: External ear normal.     Nose: Nose normal.  Eyes:     General: No scleral icterus.    Conjunctiva/sclera: Conjunctivae normal.  Neck:     Thyroid: No thyromegaly.  Cardiovascular:     Rate and Rhythm: Normal rate and regular rhythm.     Heart sounds: Normal heart sounds.  Pulmonary:     Effort: Pulmonary effort is normal.     Breath sounds: Normal breath sounds.  Abdominal:     Palpations: Abdomen is soft.  Skin:    General: Skin is warm and dry.  Neurological:     General: No focal deficit present.     Mental Status: She is alert and oriented to person, place, and time. Mental status is at baseline.     Sensory: No sensory deficit.     Motor: No weakness.  Psychiatric:        Mood and Affect: Mood normal.        Behavior: Behavior  normal.        Thought Content: Thought content normal.        Judgment: Judgment normal.         Assessment & Plan    1. TIA (transient ischemic attack) Mild TIA versus anxiety.  At this time stay on full dose 325 mg aspirin. More than 25 minutes spent in this visit, more than 50% spent in counseling and coordination of and review  of care. 2. Essential (primary) hypertension Controlled on metoprolol.  Consider ARB.  3. Hypercholesteremia Check lipid levels in the future.  4. Carcinoma of upper-outer quadrant of right breast in female, estrogen receptor positive ([place])   5. Anxiety   6. Controlled type 2 diabetes mellitus without complication, without long-term current use of insulin (HCC) We will recheck this summer in face of coronavirus pandemic.    I have done the exam and reviewed the above chart and it is accurate to the best of my knowledge. Development worker, community has been used in this note in any air is in the dictation or transcription are unintentional.  [name], MD  [place]

## 2018-11-21 ENCOUNTER — Other Ambulatory Visit: Payer: Self-pay

## 2018-11-22 ENCOUNTER — Inpatient Hospital Stay: Payer: Medicare HMO | Admitting: Internal Medicine

## 2018-11-22 ENCOUNTER — Telehealth: Payer: Self-pay | Admitting: *Deleted

## 2018-11-22 ENCOUNTER — Inpatient Hospital Stay: Payer: Medicare HMO | Attending: Internal Medicine

## 2018-11-22 ENCOUNTER — Other Ambulatory Visit: Payer: Self-pay

## 2018-11-22 DIAGNOSIS — Z79811 Long term (current) use of aromatase inhibitors: Secondary | ICD-10-CM | POA: Diagnosis not present

## 2018-11-22 DIAGNOSIS — Z170 Estrogen receptor positive status [ER+]: Secondary | ICD-10-CM | POA: Diagnosis not present

## 2018-11-22 DIAGNOSIS — C50411 Malignant neoplasm of upper-outer quadrant of right female breast: Secondary | ICD-10-CM | POA: Diagnosis not present

## 2018-11-22 LAB — CBC WITH DIFFERENTIAL/PLATELET
Abs Immature Granulocytes: 0.01 10*3/uL (ref 0.00–0.07)
Basophils Absolute: 0.1 10*3/uL (ref 0.0–0.1)
Basophils Relative: 1 %
Eosinophils Absolute: 0.2 10*3/uL (ref 0.0–0.5)
Eosinophils Relative: 3 %
HCT: 43.4 % (ref 36.0–46.0)
Hemoglobin: 14.4 g/dL (ref 12.0–15.0)
Immature Granulocytes: 0 %
Lymphocytes Relative: 37 %
Lymphs Abs: 1.9 10*3/uL (ref 0.7–4.0)
MCH: 30.3 pg (ref 26.0–34.0)
MCHC: 33.2 g/dL (ref 30.0–36.0)
MCV: 91.2 fL (ref 80.0–100.0)
Monocytes Absolute: 0.4 10*3/uL (ref 0.1–1.0)
Monocytes Relative: 7 %
Neutro Abs: 2.7 10*3/uL (ref 1.7–7.7)
Neutrophils Relative %: 52 %
Platelets: 258 10*3/uL (ref 150–400)
RBC: 4.76 MIL/uL (ref 3.87–5.11)
RDW: 12.1 % (ref 11.5–15.5)
WBC: 5.3 10*3/uL (ref 4.0–10.5)
nRBC: 0 % (ref 0.0–0.2)

## 2018-11-22 LAB — COMPREHENSIVE METABOLIC PANEL
ALT: 20 U/L (ref 0–44)
AST: 22 U/L (ref 15–41)
Albumin: 4.2 g/dL (ref 3.5–5.0)
Alkaline Phosphatase: 73 U/L (ref 38–126)
Anion gap: 6 (ref 5–15)
BUN: 9 mg/dL (ref 8–23)
CO2: 28 mmol/L (ref 22–32)
Calcium: 9.4 mg/dL (ref 8.9–10.3)
Chloride: 103 mmol/L (ref 98–111)
Creatinine, Ser: 0.87 mg/dL (ref 0.44–1.00)
GFR calc Af Amer: >60 mL/min (ref >60)
GFR calc non Af Amer: >60 mL/min (ref >60)
Glucose, Bld: 160 mg/dL — ABNORMAL HIGH (ref 70–99)
Potassium: 3.9 mmol/L (ref 3.5–5.1)
Sodium: 137 mmol/L (ref 135–145)
Total Bilirubin: 0.4 mg/dL (ref 0.3–1.2)
Total Protein: 7.8 g/dL (ref 6.5–8.1)

## 2018-11-22 NOTE — Telephone Encounter (Signed)
Patient has lab appointment at 2:00 this afternoon, called in asking if she needs to fast for labs. I reviewed labs ordered for patient and informed her fasting is not necessary for what she is having performed.

## 2018-11-23 LAB — CANCER ANTIGEN 27.29: CA 27.29: 23.1 U/mL (ref 0.0–38.6)

## 2018-11-25 ENCOUNTER — Telehealth: Payer: Self-pay

## 2018-11-25 NOTE — Telephone Encounter (Signed)
Patient states that she is very nervous and having a high pitched voice. She would like something for possible anxiety.

## 2018-11-27 ENCOUNTER — Inpatient Hospital Stay (HOSPITAL_BASED_OUTPATIENT_CLINIC_OR_DEPARTMENT_OTHER): Payer: Medicare HMO | Admitting: Internal Medicine

## 2018-11-27 ENCOUNTER — Encounter: Payer: Self-pay | Admitting: Internal Medicine

## 2018-11-27 DIAGNOSIS — Z79811 Long term (current) use of aromatase inhibitors: Secondary | ICD-10-CM | POA: Diagnosis not present

## 2018-11-27 DIAGNOSIS — Z170 Estrogen receptor positive status [ER+]: Secondary | ICD-10-CM | POA: Diagnosis not present

## 2018-11-27 DIAGNOSIS — Z923 Personal history of irradiation: Secondary | ICD-10-CM | POA: Diagnosis not present

## 2018-11-27 DIAGNOSIS — C50411 Malignant neoplasm of upper-outer quadrant of right female breast: Secondary | ICD-10-CM

## 2018-11-27 NOTE — Telephone Encounter (Signed)
Left message to call back  

## 2018-11-27 NOTE — Telephone Encounter (Signed)
Spoke to pt and she advised that she takes the Lexapro 20 mg once daily

## 2018-11-27 NOTE — Telephone Encounter (Signed)
Change lexapro 20 to sertraline 50mg  daily.

## 2018-11-27 NOTE — Telephone Encounter (Signed)
How is she taking her EsCitalopram?

## 2018-11-27 NOTE — Assessment & Plan Note (Addendum)
#  2017 right breast cancer stage I ER/PR positive HER-2/neu negative status post lumpectomy. No chemotherapy. Status post adjuvant radiation.  STABLE.   Currently on Femara; continue the same.  Tolerating well without any side effects.  # BMD: May 2019 normal; continue calcium and vitamin D.  Stable.   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    # DISPOSITION:  Follow up in 6 months/labs-cbc/cmp/ca-27-29-/MD- dr.B

## 2018-11-27 NOTE — Progress Notes (Signed)
I connected with Leah Schaefer on 11/27/18 at  1:00 PM EDT by telephone visit and verified that I am speaking with the correct person using two identifiers.  I discussed the limitations, risks, security and privacy concerns of performing an evaluation and management service by telemedicine and the availability of in-person appointments. I also discussed with the patient that there may be a patient responsible charge related to this service. The patient expressed understanding and agreed to proceed.    Other persons participating in the visit and their role in the encounter: none   Patient's location: home  Provider's location: home   Oncology History   # FEB 2017- RIGHT BREAST  STAGE I [T1b N0 M0]. ER/PR ER positive 100% PR positive 95% HER 2 FISH negative;  INVASIVE MAMMARY CARCINOMA WITH MICROPAPILLARY FEATURES. S/p Lumpec [Dr.Byrnett] & RT; No Oncotype; Femara  # 2013- LEFT BREAST- STAGE II; ER/PR/ Her 2 NEG s/p Lumpec [Dr.Byrnett] & RT  # BMD- April  2017- wnl; genetic testing- declined [May 2017]  # Vertigo [uses rolling walker]     Carcinoma of upper-outer quadrant of right breast in female, estrogen receptor positive ([place])     Chief Complaint:breast cancer   History of present illness:Leah Schaefer 78 y.o.  female with history of early stage breast cancer ER PR positive on Femara.  Patient denies any new lumps or bumps.  Appetite is good.  No weight loss.  No nausea no vomiting no headaches.  Observation/objective: CBC CMP tumor marker within normal limits.  Assessment and plan: Carcinoma of upper-outer quadrant of right breast in female, estrogen receptor positive ([place]) # 2017 right breast cancer stage I ER/PR positive HER-2/neu negative status post lumpectomy. No chemotherapy. Status post adjuvant radiation.  STABLE.   Currently on Femara; continue the same.  Tolerating well without any side effects.  # BMD: May 2019 normal; continue calcium and vitamin D.  Stable.    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    # DISPOSITION:  Follow up in 6 months/labs-cbc/cmp/ca-27-29-/MD- dr.B      Follow-up instructions:  I discussed the assessment and treatment plan with the patient.  The patient was provided an opportunity to ask questions and all were answered.  The patient agreed with the plan and demonstrated understanding of instructions.  The patient was advised to call back or seek an in person evaluation if the symptoms worsen or if the condition fails to improve as anticipated.  I provided 12 minutes of non face-to-face telephone visit time during this encounter, and > 50% was spent counseling as documented under my assessment & plan.   Dr. [name] [place] at [facility] 11/27/2018 1:19 PM

## 2018-11-28 ENCOUNTER — Other Ambulatory Visit: Payer: Self-pay

## 2018-11-28 DIAGNOSIS — C50411 Malignant neoplasm of upper-outer quadrant of right female breast: Secondary | ICD-10-CM

## 2018-11-28 DIAGNOSIS — Z170 Estrogen receptor positive status [ER+]: Principal | ICD-10-CM

## 2018-11-28 MED ORDER — SERTRALINE HCL 50 MG PO TABS: 50.0000 mg | ORAL_TABLET | Freq: Every day | ORAL | 3 refills | Status: DC

## 2018-11-28 MED ORDER — ASPIRIN 325 MG PO TABS: 325.0000 mg | ORAL_TABLET | Freq: Every day | ORAL | 1 refills | Status: AC

## 2018-11-28 MED ORDER — GABAPENTIN 300 MG PO CAPS: 600.0000 mg | ORAL_CAPSULE | Freq: Every day | ORAL | 3 refills | Status: DC

## 2018-11-28 MED ORDER — ATORVASTATIN CALCIUM 40 MG PO TABS: 40.0000 mg | ORAL_TABLET | Freq: Every evening | ORAL | 3 refills | Status: DC

## 2018-11-28 NOTE — Addendum Note (Signed)
Addended by: Edd Arbour B on: 11/28/2018 02:41 PM   Modules accepted: Orders

## 2018-11-28 NOTE — Telephone Encounter (Signed)
Spoke to pt and sent new rx to pharmacy and several others per pt request to pharmacy.  dbs

## 2018-12-02 ENCOUNTER — Other Ambulatory Visit: Payer: Self-pay

## 2018-12-02 DIAGNOSIS — C50211 Malignant neoplasm of upper-inner quadrant of right female breast: Secondary | ICD-10-CM

## 2018-12-02 DIAGNOSIS — Z170 Estrogen receptor positive status [ER+]: Principal | ICD-10-CM

## 2018-12-23 ENCOUNTER — Other Ambulatory Visit: Payer: Self-pay | Admitting: Family Medicine

## 2018-12-23 NOTE — Telephone Encounter (Signed)
Please review

## 2019-01-14 ENCOUNTER — Other Ambulatory Visit: Payer: Self-pay

## 2019-01-14 ENCOUNTER — Encounter: Payer: Medicare HMO | Admitting: Family Medicine

## 2019-01-14 ENCOUNTER — Ambulatory Visit (INDEPENDENT_AMBULATORY_CARE_PROVIDER_SITE_OTHER): Payer: Medicare HMO

## 2019-01-14 DIAGNOSIS — Z0000 Encounter for general adult medical examination without abnormal findings: Secondary | ICD-10-CM | POA: Diagnosis not present

## 2019-01-14 NOTE — Patient Instructions (Addendum)
Leah Schaefer , Thank you for taking time to come for your Medicare Wellness Visit. I appreciate your ongoing commitment to your health goals. Please review the following plan we discussed and let me know if I can assist you in the future.   Screening recommendations/referrals: Colonoscopy: No longer required.  Mammogram: No longer required.  Bone Density: Normal DEXA previously. No repeat needed.  Recommended yearly ophthalmology/optometry visit for glaucoma screening and checkup Recommended yearly dental visit for hygiene and checkup  Vaccinations: Influenza vaccine: Up to date Pneumococcal vaccine: Completed series Tdap vaccine: Up to date, due 04/2021 Shingles vaccine: Pt declines today.     Advanced directives: Advance directive discussed with you today. Even though you declined this today please call our office should you change your mind and we can give you the proper paperwork for you to fill out.  Conditions/risks identified: Recommend to exercise/walk for 3 days a week for at least 30 minutes at a time.   Next appointment: 02/13/19 with Dr Rosanna Randy. Declined scheduling an AWV for 2021.    Preventive Care 24 Years and Older, Female Preventive care refers to lifestyle choices and visits with your health care provider that can promote health and wellness. What does preventive care include?  A yearly physical exam. This is also called an annual well check.  Dental exams once or twice a year.  Routine eye exams. Ask your health care provider how often you should have your eyes checked.  Personal lifestyle choices, including:  Daily care of your teeth and gums.  Regular physical activity.  Eating a healthy diet.  Avoiding tobacco and drug use.  Limiting alcohol use.  Practicing safe sex.  Taking low-dose aspirin every day.  Taking vitamin and mineral supplements as recommended by your health care provider. What happens during an annual well check? The services and  screenings done by your health care provider during your annual well check will depend on your age, overall health, lifestyle risk factors, and family history of disease. Counseling  Your health care provider may ask you questions about your:  Alcohol use.  Tobacco use.  Drug use.  Emotional well-being.  Home and relationship well-being.  Sexual activity.  Eating habits.  History of falls.  Memory and ability to understand (cognition).  Work and work Statistician.  Reproductive health. Screening  You may have the following tests or measurements:  Height, weight, and BMI.  Blood pressure.  Lipid and cholesterol levels. These may be checked every 5 years, or more frequently if you are over 103 years old.  Skin check.  Lung cancer screening. You may have this screening every year starting at age 77 if you have a 30-pack-year history of smoking and currently smoke or have quit within the past 15 years.  Fecal occult blood test (FOBT) of the stool. You may have this test every year starting at age 66.  Flexible sigmoidoscopy or colonoscopy. You may have a sigmoidoscopy every 5 years or a colonoscopy every 10 years starting at age 39.  Hepatitis C blood test.  Hepatitis B blood test.  Sexually transmitted disease (STD) testing.  Diabetes screening. This is done by checking your blood sugar (glucose) after you have not eaten for a while (fasting). You may have this done every 1-3 years.  Bone density scan. This is done to screen for osteoporosis. You may have this done starting at age 72.  Mammogram. This may be done every 1-2 years. Talk to your health care provider about how  often you should have regular mammograms. Talk with your health care provider about your test results, treatment options, and if necessary, the need for more tests. Vaccines  Your health care provider may recommend certain vaccines, such as:  Influenza vaccine. This is recommended every year.   Tetanus, diphtheria, and acellular pertussis (Tdap, Td) vaccine. You may need a Td booster every 10 years.  Zoster vaccine. You may need this after age 46.  Pneumococcal 13-valent conjugate (PCV13) vaccine. One dose is recommended after age 23.  Pneumococcal polysaccharide (PPSV23) vaccine. One dose is recommended after age 39. Talk to your health care provider about which screenings and vaccines you need and how often you need them. This information is not intended to replace advice given to you by your health care provider. Make sure you discuss any questions you have with your health care provider. Document Released: 08/20/2015 Document Revised: 04/12/2016 Document Reviewed: 05/25/2015 Elsevier Interactive Patient Education  2017 [employer] Prevention in the Home Falls can cause injuries. They can happen to people of all ages. There are many things you can do to make your home safe and to help prevent falls. What can I do on the outside of my home?  Regularly fix the edges of walkways and driveways and fix any cracks.  Remove anything that might make you trip as you walk through a door, such as a raised step or threshold.  Trim any bushes or trees on the path to your home.  Use bright outdoor lighting.  Clear any walking paths of anything that might make someone trip, such as rocks or tools.  Regularly check to see if handrails are loose or broken. Make sure that both sides of any steps have handrails.  Any raised decks and porches should have guardrails on the edges.  Have any leaves, snow, or ice cleared regularly.  Use sand or salt on walking paths during winter.  Clean up any spills in your garage right away. This includes oil or grease spills. What can I do in the bathroom?  Use night lights.  Install grab bars by the toilet and in the tub and shower. Do not use towel bars as grab bars.  Use non-skid mats or decals in the tub or shower.  If you need to sit  down in the shower, use a plastic, non-slip stool.  Keep the floor dry. Clean up any water that spills on the floor as soon as it happens.  Remove soap buildup in the tub or shower regularly.  Attach bath mats securely with double-sided non-slip rug tape.  Do not have throw rugs and other things on the floor that can make you trip. What can I do in the bedroom?  Use night lights.  Make sure that you have a light by your bed that is easy to reach.  Do not use any sheets or blankets that are too big for your bed. They should not hang down onto the floor.  Have a firm chair that has side arms. You can use this for support while you get dressed.  Do not have throw rugs and other things on the floor that can make you trip. What can I do in the kitchen?  Clean up any spills right away.  Avoid walking on wet floors.  Keep items that you use a lot in easy-to-reach places.  If you need to reach something above you, use a strong step stool that has a grab bar.  Keep electrical  cords out of the way.  Do not use floor polish or wax that makes floors slippery. If you must use wax, use non-skid floor wax.  Do not have throw rugs and other things on the floor that can make you trip. What can I do with my stairs?  Do not leave any items on the stairs.  Make sure that there are handrails on both sides of the stairs and use them. Fix handrails that are broken or loose. Make sure that handrails are as long as the stairways.  Check any carpeting to make sure that it is firmly attached to the stairs. Fix any carpet that is loose or worn.  Avoid having throw rugs at the top or bottom of the stairs. If you do have throw rugs, attach them to the floor with carpet tape.  Make sure that you have a light switch at the top of the stairs and the bottom of the stairs. If you do not have them, ask someone to add them for you. What else can I do to help prevent falls?  Wear shoes that:  Do not have  high heels.  Have rubber bottoms.  Are comfortable and fit you well.  Are closed at the toe. Do not wear sandals.  If you use a stepladder:  Make sure that it is fully opened. Do not climb a closed stepladder.  Make sure that both sides of the stepladder are locked into place.  Ask someone to hold it for you, if possible.  Clearly mark and make sure that you can see:  Any grab bars or handrails.  First and last steps.  Where the edge of each step is.  Use tools that help you move around (mobility aids) if they are needed. These include:  Canes.  Walkers.  Scooters.  Crutches.  Turn on the lights when you go into a dark area. Replace any light bulbs as soon as they burn out.  Set up your furniture so you have a clear path. Avoid moving your furniture around.  If any of your floors are uneven, fix them.  If there are any pets around you, be aware of where they are.  Review your medicines with your doctor. Some medicines can make you feel dizzy. This can increase your chance of falling. Ask your doctor what other things that you can do to help prevent falls. This information is not intended to replace advice given to you by your health care provider. Make sure you discuss any questions you have with your health care provider. Document Released: 05/20/2009 Document Revised: 12/30/2015 Document Reviewed: 08/28/2014 Elsevier Interactive Patient Education  2017 [employer].

## 2019-01-14 NOTE — Progress Notes (Signed)
Subjective:   Leah Schaefer is a 78 y.o. female who presents for Medicare Annual (Subsequent) preventive examination.    This visit is being conducted through telemedicine due to the COVID-19 pandemic. This patient has given me verbal consent via doximity to conduct this visit, patient states they are participating from their home address. Some vital signs may be absent or patient reported.    Patient identification: identified by name, DOB, and current address  Review of Systems:  N/A  Cardiac Risk Factors include: advanced age (>11mn, >>60women);diabetes mellitus;dyslipidemia;hypertension;obesity (BMI >30kg/m2)     Objective:     Vitals: There were no vitals taken for this visit.  There is no height or weight on file to calculate BMI. Unable to obtain vitals due to visit being conducted via telephonically.   Advanced Directives 01/14/2019 11/27/2018 11/07/2018 11/06/2018 06/12/2018 05/23/2018 01/09/2018  Does Patient Have a Medical Advance Directive? _0  No No  Does patient want to make changes to medical advance directive? - - - - - - -  Would patient like information on creating a medical advance directive? No - Patient declined No - Patient declined No - Patient declined - No - Patient declined No - Patient declined No - Patient declined    Tobacco Social History   Tobacco Use  Smoking Status Never Smoker  Smokeless Tobacco Never Used     Counseling given: Not Answered   Clinical Intake:     Pain : No/denies pain Pain Score: 0-No pain     Nutritional Status: BMI > 30  Obese Nutritional Risks: None Diabetes: Yes  How often do you need to have someone help you when you read instructions, pamphlets, or other written materials from your doctor or pharmacy?: 1 - Never   Diabetes:  Is the patient diabetic?  Yes type 2 If diabetic, was a CBG obtained today?  No  Did the patient bring in their glucometer from home?  No  How often do you monitor your  CBG's? Every morning.   Financial Strains and Diabetes Management:  Are you having any financial strains with the device, your supplies or your medication? No .  Does the patient want to be seen by Chronic Care Management for management of their diabetes?  No  Would the patient like to be referred to a Nutritionist or for Diabetic Management?  No   Diabetic Exams:  Diabetic Eye Exam: Completed 01/09/18. Overdue for diabetic eye exam. Pt has been advised about the importance in completing this exam. Pt plans to schedule an eye exam this year.   Diabetic Foot Exam: Completed 03/22/17. Pt has been advised about the importance in completing this exam. Note made to f/u on this at next in office visit.     Interpreter Needed?: No  Information entered by :: [facility]  Past Medical History:  Diagnosis Date  . Breast cancer ([place]   . 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  . Cystitis   . Diabetes mellitus without complication (HCC)    NO MEDS-DIET CONTROLLED  . GERD (gastroesophageal reflux disease)   . Hernia 2011  . Hyperlipidemia   . Hypertension   . Malignant ne[place] of [place] quadrant of [place] breast ([place]   Left, T2 (2.3 cm) N0, triple negative. Chemotherapy/post wide excision whole breast radiation.  . 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 Personal history of chemotherapy 2013  . Personal history of malignant neoplasm of breast   . Personal history of radiation therapy 2017    Rt  . Vertigo    Past Surgical History:  Procedure Laterality Date  . BREAST BIOPSY Left 07/18/2011   +  . BREAST BIOPSY Left 09/21/2015   neg  . BREAST BIOPSY Left 04/05/2016   neg  . BREAST EXCISIONAL BIOPSY Right 2017   +  . BREAST LUMPECTOMY Right 09/30/2015  . BREAST LUMPECTOMY Left 08/17/2011  . BREAST LUMPECTOMY WITH SENTINEL LYMPH  NODE BIOPSY Right 09/30/2015   Procedure: BREAST LUMPECTOMY WITH SENTINEL LYMPH NODE BX;  Surgeon: Robert Bellow, MD;  Location: [facility] ORS;  Service: General;  Laterality: Right;  . BREAST SURGERY Left 2012   wide excision  . BREAST SURGERY Left November 2013   Core biopsy of the upper-outer quadrant showed fat necrosis.  . CHOLECYSTECTOMY  2007  . COLONOSCOPY  2011   Dr Candace Cruise  . COLONOSCOPY WITH PROPOFOL N/A 02/25/2015   Procedure: COLONOSCOPY WITH PROPOFOL;  Surgeon: Hulen Luster, MD;  Location: [facility] ENDOSCOPY;  Service: Gastroenterology;  Laterality: N/A;  . DILATION AND CURETTAGE OF UTERUS  2004  . HERNIA REPAIR Right 73/22/0254   Umbilical/ventral hernia repaired with 6.4 cm Proceed ventral patch  . PORT-A-CATH REMOVAL    . PORTACATH PLACEMENT  2013  . 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  Marland Kitchen UPPER GI ENDOSCOPY  2011   Family History  Problem Relation Age of Onset  . Lymphoma Father   . Ovarian cancer Sister   . Colon cancer Brother   . Lymphoma Brother   . Cancer Other        stomach  . Cancer Other        stomach  . Breast cancer Neg Hx    Social History   Socioeconomic History  . Marital status: Married    Spouse name: Not on file  . Number of children: 2  . Years of education: H/S  . Highest education level: 12th grade  Occupational History  . Occupation: Retired  Scientific laboratory technician  . Financial resource strain: Not hard at all  . Food insecurity:    Worry: Never true    Inability: Never true  . Transportation needs:    Medical: No    Non-medical: No  Tobacco Use  . Smoking status: Never Smoker  . Smokeless tobacco: Never Used  Substance and Sexual Activity  . Alcohol use: No  . Drug use: No  . Sexual activity: Not on file  Lifestyle  . Physical activity:    Days per week: 0 days    Minutes per session: 0 min  . Stress: Not at all   Relationships  . Social connections:    Talks on phone: Patient refused    Gets together: Patient refused    Attends religious service: Patient refused    Active member of club or organization: Patient refused    Attends meetings of clubs or organizations: Patient refused    Relationship status: Patient refused  Other Topics Concern  . Not on file  Social History Narrative  . Not on file    Outpatient Encounter Medications as of 01/14/2019  Medication Sig  . ACCU-CHEK FASTCLIX LANCETS MISC CHECK SUGAR ONCE DAILY   . acetaminophen (TYLENOL) 500 MG tablet Take 500 mg  by mouth every 6 (six) hours as needed for mild pain or headache.   Marland Kitchen acyclovir (ZOVIRAX) 200 MG capsule TAKE 1 CAPSULE EVERY DAY  . Alcohol Swabs (B-D SINGLE USE SWABS REGULAR) PADS CHECK SUGAR ONCE DAILY  . atorvastatin (LIPITOR) 40 MG tablet Take 1 tablet (40 mg total) by mouth every evening.  . bimatoprost (LUMIGAN) 0.01 % SOLN Place 1 drop into both eyes at bedtime.   . Blood Glucose Calibration (ACCU-CHEK SMARTVIEW CONTROL) LIQD   . Blood Glucose Monitoring Suppl (ACCU-CHEK AVIVA PLUS) w/Device KIT USE AS DIRECTED  . Calcium Carb-Cholecalciferol (CALCIUM 600+D3) 600-800 MG-UNIT TABS Take 1 tablet by mouth 2 (two) times daily.  . cholecalciferol (VITAMIN D) 1000 units tablet Take 1,000 Units by mouth daily.  . cyanocobalamin 500 MCG tablet Take 500 mcg by mouth daily.  Marland Kitchen gabapentin (NEURONTIN) 300 MG capsule Take 2 capsules (600 mg total) by mouth at bedtime.  Marland Kitchen glucose blood (ACCU-CHEK SMARTVIEW) test strip 1 each by Other route daily. Use as instructed  . Lancets (ACCU-CHEK SOFT TOUCH) lancets Use as instructed  . latanoprost (XALATAN) 0.005 % ophthalmic solution Place 1 drop into both eyes at bedtime.   Marland Kitchen letrozole (FEMARA) 2.5 MG tablet Take 1 tablet (2.5 mg total) by mouth daily.  . meclizine (ANTIVERT) 25 MG tablet Take 0.5 tablets (12.5 mg total) by mouth 3 (three) times daily as needed for dizziness.  .  meloxicam (MOBIC) 15 MG tablet TAKE 1 TABLET EVERY DAY  . metFORMIN (GLUCOPHAGE) 500 MG tablet TAKE 1 TABLET BY MOUTH TWICE DAILY WITH MEALS  . metoprolol tartrate (LOPRESSOR) 25 MG tablet TAKE 1/2 TABLET TWICE DAILY  . Multiple Vitamin (MULTIVITAMIN WITH MINERALS) TABS tablet Take 1 tablet by mouth daily.  Marland Kitchen omega-3 acid ethyl esters (LOVAZA) 1 g capsule Take 3 g by mouth daily.   . pantoprazole (PROTONIX) 40 MG tablet TAKE 1 TABLET TWICE DAILY  . pyridoxine (B-6) 100 MG tablet Take 100 mg by mouth daily.  . sertraline (ZOLOFT) 50 MG tablet Take 1 tablet (50 mg total) by mouth daily.  . vitamin C (ASCORBIC ACID) 500 MG tablet Take 1,000 mg by mouth daily.  . vitamin E 400 UNIT capsule Take 400 Units by mouth daily.   No facility-administered encounter medications on file as of 01/14/2019.     Activities of Daily Living In your present state of health, do you have any difficulty performing the following activities: 01/14/2019 11/07/2018  Hearing? N N  Vision? N N  Comment Wears eye glasses daily.  -  Difficulty concentrating or making decisions? Y N  Walking or climbing stairs? Y N  Comment Due to SOB.  -  Dressing or bathing? N N  Doing errands, shopping? N N  Preparing Food and eating ? N -  Using the Toilet? N -  In the past six months, have you accidently leaked urine? N -  Do you have problems with loss of bowel control? N -  Managing your Medications? N -  Managing your Finances? N -  Housekeeping or managing your Housekeeping? N -  Some recent data might be hidden    Patient Care Team: Jerrol Banana., MD as PCP - General (Family Medicine) Byrnett, Forest Gleason, MD (General Surgery) Cammie Sickle, MD as Consulting Physician (Internal Medicine) Lorelee Cover., MD as Consulting Physician (Ophthalmology) Noreene Filbert, MD as Referring Physician (Radiation Oncology)    Assessment:   This is a routine wellness examination for Kihanna.  Exercise  Activities and  Dietary recommendations Current Exercise Habits: The patient does not participate in regular exercise at present, Exercise limited by: None identified  Goals    . DIET - INCREASE LEAN PROTEINS     Recommend to continue current diet regimen of eating chicken that is broil or grilled only and avoiding other fatty meats.     . Exercise 3x per week (30 min per time)     Recommend to exercise/walk for 3 days a week for at least 30 minutes at a time.      . Increase lean proteins     Recommend decreasing fatty meats and increasing lean meats in diet.       Fall Risk: Fall Risk  01/14/2019 01/09/2018 12/06/2016 10/18/2015 05/14/2015  Falls in the past year? 0 Yes No No No  Number falls in past yr: - 1 - - -  Injury with Fall? - No - - -  Follow up - Falls prevention discussed - - -    FALL RISK PREVENTION PERTAINING TO THE HOME:  Any stairs in or around the home? Yes  If so, are there any without handrails? No   Home free of loose throw rugs in walkways, pet beds, electrical cords, etc? Yes  Adequate lighting in your home to reduce risk of falls? Yes   ASSISTIVE DEVICES UTILIZED TO PREVENT FALLS:  Life alert? No  Use of a cane, walker or w/c? No  Grab bars in the bathroom? Yes  Shower chair or bench in shower? Yes  Elevated toilet seat or a handicapped toilet? Yes   TIMED UP AND GO:  Was the test performed? No .    Depression Screen PHQ 2/9 Scores 01/14/2019 01/09/2018 02/20/2017 12/06/2016  PHQ - 2 Score 2 0 2 0  PHQ- 9 Score 3 - 6 0     Cognitive Function: Declined today.      6CIT Screen 12/06/2016  What Year? 0 points  What month? 0 points  What time? 0 points  Count back from 20 0 points  Months in reverse 4 points  Repeat phrase 4 points  Total Score 8    Immunization History  Administered Date(s) Administered  . Influenza, High Dose Seasonal PF 05/13/2014, 05/29/2015, 04/05/2016, 04/19/2017, 04/11/2018  . Influenza,inj,Quad PF,6+ Mos 05/23/2013  . Pneumococcal  Conjugate-13 05/13/2014  . Pneumococcal Polysaccharide-23 06/07/2012  . Td 04/19/2007, 05/05/2011  . Tdap 05/05/2011  . Zoster 04/27/2008    Qualifies for Shingles Vaccine? Yes  Zostavax completed 04/27/08. Due for Shingrix. Education has been provided regarding the importance of this vaccine. Pt has been advised to call insurance company to determine out of pocket expense. Advised may also receive vaccine at local pharmacy or Health Dept. Verbalized acceptance and understanding.  Tdap: Up to date  Flu Vaccine: Up to date  Pneumococcal Vaccine: Completed series  Screening Tests Health Maintenance  Topic Date Due  . FOOT EXAM  03/22/2018  . URINE MICROALBUMIN  04/23/2018  . OPHTHALMOLOGY EXAM  01/10/2019  . INFLUENZA VACCINE  03/08/2019  . HEMOGLOBIN A1C  05/09/2019  . TETANUS/TDAP  05/04/2021  . DEXA SCAN  Completed  . PNA vac Low Risk Adult  Completed    Cancer Screenings:  Colorectal Screening: No longer required.   Mammogram: No longer required.   Bone Density: Completed 12/18/17. Results reflect NORMAL. No repeat needed.  Lung Cancer Screening: (Low Dose CT Chest recommended if Age 17-80 years, 30 pack-year currently smoking OR have quit w/in 15years.) does not  qualify.   Additional Screening:  Dental Screening: Recommended annual dental exams for proper oral hygiene   Community Resource Referral:  CRR required this visit?  No       Plan:  I have personally reviewed and addressed the Medicare Annual Wellness questionnaire and have noted the following in the patient's chart:  A. Medical and social history B. Use of alcohol, tobacco or illicit drugs  C. Current medications and supplements D. Functional ability and status E.  Nutritional status F.  Physical activity G. Advance directives H. List of other physicians I.  Hospitalizations, surgeries, and ER visits in previous 12 months J.  [place] such as hearing and vision if needed, cognitive  and depression L. Referrals and appointments   In addition, I have reviewed and discussed with patient certain preventive protocols, quality metrics, and best practice recommendations. A written personalized care plan for preventive services as well as general preventive health recommendations were provided to patient.   Glendora Score, [place]  04/10/4067 Nurse Health Advisor   Nurse Notes: Pt needs a diabetic foot exam and urine check at next in office visit. Pt plans to set up an eye exam this year.

## 2019-01-28 ENCOUNTER — Telehealth: Payer: Self-pay | Admitting: Family Medicine

## 2019-01-28 NOTE — Telephone Encounter (Signed)
Please review. Would you like to work the patient in? No one has any openings today.

## 2019-01-28 NOTE — Telephone Encounter (Signed)
Pt stated she checked her sugar this morning before eating or drinking and it was 209. Pt denied feeling weak, dizzy, or nauseous. Pt is requesting if she can come in and have her sugar tested. Please advise. Thanks TNP

## 2019-01-29 NOTE — Telephone Encounter (Signed)
Spoke with patient and she is feeling much better. She feels that her symptoms were food related. Her blood sugar was 160 this morning, and this is normal for her.

## 2019-01-29 NOTE — Telephone Encounter (Signed)
240 today ok if not better.

## 2019-02-03 ENCOUNTER — Ambulatory Visit
Admission: RE | Admit: 2019-02-03 | Discharge: 2019-02-03 | Disposition: A | Discharge status: Home / Self Care | Payer: Medicare HMO | Source: Ambulatory Visit | Attending: General Surgery | Admitting: General Surgery

## 2019-02-03 ENCOUNTER — Other Ambulatory Visit: Payer: Self-pay

## 2019-02-03 DIAGNOSIS — R922 Inconclusive mammogram: Secondary | ICD-10-CM | POA: Diagnosis not present

## 2019-02-03 DIAGNOSIS — Z170 Estrogen receptor positive status [ER+]: Secondary | ICD-10-CM

## 2019-02-03 DIAGNOSIS — N6323 Unspecified lump in the left breast, lower outer quadrant: Secondary | ICD-10-CM | POA: Diagnosis not present

## 2019-02-03 DIAGNOSIS — C50211 Malignant neoplasm of upper-inner quadrant of right female breast: Secondary | ICD-10-CM

## 2019-02-03 DIAGNOSIS — N6322 Unspecified lump in the left breast, upper inner quadrant: Secondary | ICD-10-CM | POA: Diagnosis not present

## 2019-02-03 DIAGNOSIS — R921 Mammographic calcification found on diagnostic imaging of breast: Secondary | ICD-10-CM | POA: Diagnosis not present

## 2019-02-11 ENCOUNTER — Other Ambulatory Visit: Payer: Self-pay

## 2019-02-11 ENCOUNTER — Encounter: Payer: Self-pay | Admitting: General Surgery

## 2019-02-11 ENCOUNTER — Ambulatory Visit: Payer: Medicare HMO | Admitting: General Surgery

## 2019-02-11 VITALS — BP 148/84 | HR 84 | Temp 96.1°F | Ht 66.0 in | Wt 208.0 lb

## 2019-02-11 DIAGNOSIS — C50211 Malignant neoplasm of upper-inner quadrant of right female breast: Secondary | ICD-10-CM

## 2019-02-11 DIAGNOSIS — Z170 Estrogen receptor positive status [ER+]: Secondary | ICD-10-CM

## 2019-02-11 NOTE — Patient Instructions (Addendum)
The patient is aware to call back for any questions or new concerns.  

## 2019-02-11 NOTE — Progress Notes (Signed)
Patient ID: Leah Schaefer, female   DOB: 05-31-1941, 78 y.o.   MRN: [id number]  Chief Complaint  Patient presents with  . Follow-up    f/u recall bil diag mammo [facility] 02/03/19,     HPI Leah Schaefer is a 78 y.o. female who presents for her left and right breast cancer follow up and a breast evaluation. The most recent mammogram was done on 02/03/2019.  She states the radiologist was concerned about the left breast. Patient does perform regular self breast checks and gets regular mammograms done.   No new breast issues.  HPI  Past Medical History:  Diagnosis Date  . Breast cancer ([place])   . Breast cancer of upper-inner quadrant of right female breast ([place]) 09/21/2015   T1bN0 " 82m; ER100%, PR 90%, HER-2/neu not overexpressed.  . Cystitis   . Diabetes mellitus without complication (HCC)    NO MEDS-DIET CONTROLLED  . GERD (gastroesophageal reflux disease)   . Hernia 2011  . Hyperlipidemia   . Hypertension   . Malignant [place] of [place] of [place] ([place]   Left, T2 (2.3 cm) N0, triple negative. Chemotherapy/post wide excision whole breast radiation.  . 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 Personal history of chemotherapy 2013  . Personal history of malignant neoplasm of breast   . Personal history of radiation therapy 2017    Rt  . Vertigo     Past Surgical History:  Procedure Laterality Date  . BREAST BIOPSY Left 07/18/2011   +  . BREAST BIOPSY Left 09/21/2015   neg  . BREAST BIOPSY Left 04/05/2016   neg  . BREAST EXCISIONAL BIOPSY Right 2017   +  . BREAST LUMPECTOMY Right 09/30/2015  . BREAST LUMPECTOMY Left 08/17/2011  . BREAST LUMPECTOMY WITH SENTINEL LYMPH NODE BIOPSY Right 09/30/2015   Procedure: BREAST LUMPECTOMY WITH SENTINEL LYMPH NODE BX;  Surgeon: JRobert Bellow MD;  Location: [facility] ORS;  Service: General;  Laterality: Right;  . BREAST SURGERY Left 2012   wide excision  . BREAST SURGERY  Left November 2013   Core biopsy of the upper-outer quadrant showed fat necrosis.  . CHOLECYSTECTOMY  2007  . COLONOSCOPY  2011   Dr OCandace Cruise . COLONOSCOPY WITH PROPOFOL N/A 02/25/2015   Procedure: COLONOSCOPY WITH PROPOFOL;  Surgeon: PHulen Luster MD;  [facility]  Service: Gastroenterology;  Laterality: N/A;  . DILATION AND CURETTAGE OF UTERUS  2004  . HERNIA REPAIR Right [id number]/85/0277  Umbilical/ventral hernia repaired with 6.4 cm Proceed ventral patch  . PORT-A-CATH REMOVAL    . PORTACATH PLACEMENT  2013  . 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  .Marland KitchenUPPER GI ENDOSCOPY  2011    Family History  Problem Relation Age of Onset  . Lymphoma Father   . Ovarian cancer Sister   . Colon cancer Brother   . Lymphoma Brother   . Cancer Other        stomach  . Cancer Other        stomach  . Breast cancer Neg Hx     Social History Social History   Tobacco Use  . Smoking status: Never Smoker  . Smokeless tobacco: Never Used  Substance Use Topics  . Alcohol use: No  . Drug use: No    No Known Allergies  Current Outpatient Medications  Medication Sig Dispense Refill  . ACCU-CHEK FASTCLIX LANCETS MISC CHECK SUGAR ONCE DAILY  102 each 3  . acetaminophen (TYLENOL) 500 MG tablet Take 500 mg by mouth every 6 (six) hours as needed for mild pain or headache.     Marland Kitchen acyclovir (ZOVIRAX) 200 MG capsule TAKE 1 CAPSULE EVERY DAY 90 capsule 0  . Alcohol Swabs (B-D SINGLE USE SWABS REGULAR) PADS CHECK SUGAR ONCE DAILY 100 each 3  . atorvastatin (LIPITOR) 40 MG tablet Take 1 tablet (40 mg total) by mouth every evening. 90 tablet 3  . bimatoprost (LUMIGAN) 0.01 % SOLN Place 1 drop into both eyes at bedtime.     . Blood Glucose Calibration (ACCU-CHEK SMARTVIEW CONTROL) LIQD     . Blood Glucose Monitoring Suppl (ACCU-CHEK AVIVA PLUS) w/Device KIT USE AS DIRECTED 1 kit 0  .  Calcium Carb-Cholecalciferol (CALCIUM 600+D3) 600-800 MG-UNIT TABS Take 1 tablet by mouth 2 (two) times daily.    . cholecalciferol (VITAMIN D) 1000 units tablet Take 1,000 Units by mouth daily.    . cyanocobalamin 500 MCG tablet Take 500 mcg by mouth daily.    Marland Kitchen gabapentin (NEURONTIN) 300 MG capsule Take 2 capsules (600 mg total) by mouth at bedtime. 180 capsule 3  . glucose blood (ACCU-CHEK SMARTVIEW) test strip 1 each by Other route daily. Use as instructed 100 each 3  . Lancets (ACCU-CHEK SOFT TOUCH) lancets Use as instructed 100 each 12  . latanoprost (XALATAN) 0.005 % ophthalmic solution Place 1 drop into both eyes at bedtime.     Marland Kitchen letrozole (FEMARA) 2.5 MG tablet Take 1 tablet (2.5 mg total) by mouth daily. 90 tablet 3  . meclizine (ANTIVERT) 25 MG tablet Take 0.5 tablets (12.5 mg total) by mouth 3 (three) times daily as needed for dizziness. 15 tablet 0  . meloxicam (MOBIC) 15 MG tablet TAKE 1 TABLET EVERY DAY 90 tablet 0  . metFORMIN (GLUCOPHAGE) 500 MG tablet TAKE 1 TABLET BY MOUTH TWICE DAILY WITH MEALS 60 tablet 11  . metoprolol tartrate (LOPRESSOR) 25 MG tablet TAKE 1/2 TABLET TWICE DAILY 90 tablet 3  . Multiple Vitamin (MULTIVITAMIN WITH MINERALS) TABS tablet Take 1 tablet by mouth daily.    Marland Kitchen omega-3 acid ethyl esters (LOVAZA) 1 g capsule Take 3 g by mouth daily.     . pantoprazole (PROTONIX) 40 MG tablet TAKE 1 TABLET TWICE DAILY 180 tablet 3  . pyridoxine (B-6) 100 MG tablet Take 100 mg by mouth daily.    . sertraline (ZOLOFT) 50 MG tablet Take 1 tablet (50 mg total) by mouth daily. 30 tablet 3  . vitamin C (ASCORBIC ACID) 500 MG tablet Take 1,000 mg by mouth daily.    . vitamin E 400 UNIT capsule Take 400 Units by mouth daily.     No current facility-administered medications for this visit.     Review of Systems Review of Systems  Constitutional: Negative.   Respiratory: Negative.   Cardiovascular: Negative.     Blood pressure (!) 148/84, pulse 84, temperature (!)  96.1 F (35.6 C), temperature source Temporal, height '5\' 6"'  (1.676 m), weight 208 lb (94.3 kg), SpO2 94 %.  Physical Exam Physical Exam Exam conducted with a chaperone present.  Constitutional:      Appearance: She is well-developed.  Eyes:     General: No scleral icterus.    Conjunctiva/sclera: Conjunctivae normal.  Neck:     Musculoskeletal: Neck supple.  Cardiovascular:     Rate and Rhythm: Normal rate and  regular rhythm.     Heart sounds: Normal heart sounds.  Pulmonary:     Effort: Pulmonary effort is normal.     Breath sounds: Normal breath sounds.  Chest:     Breasts:        Right: Normal. No inverted nipple, mass, nipple discharge, skin change or tenderness.        Left: Normal. No inverted nipple, mass, nipple discharge, skin change or tenderness.       Comments: Bilateral lumpectomy Lymphadenopathy:     Cervical: No cervical adenopathy.     Upper Body:     Right upper body: No supraclavicular or axillary adenopathy.     Left upper body: No supraclavicular or axillary adenopathy.  Skin:    General: Skin is warm and dry.  Neurological:     Mental Status: She is alert and oriented to person, place, and time.  Psychiatric:        Behavior: Behavior normal.     Data Reviewed Dec 25, 2017 FNA: Diagnosis  NO MALIGNANT CELLS IDENTIFIED.  Vicente Males MD  Pathologist, Electronic Signature  (Case signed 12/26/2017)  Source  Fine Needle Aspiration, left breast 5:30 near areola   Diagnosis NO MALIGNANT CELLS IDENTIFIED. Vicente Males MD Pathologist, Electronic Signature (Case signed 12/26/2017) Source Fine Needle Aspiration, left breast 9:30  February 03, 2019 bilateral diagnostic mammograms and left breast ultrasound were independently reviewed.  Postsurgical changes bilaterally.  The area in the 9 o'clock position has decreased in size from 9 to 5 mm.  The area at the 5 o'clock position is unchanged.  Recommendation for biopsy reviewed.  Assessment Stable breast  exam.  Changes consistent with fat necrosis.  Plan The patient would like to proceed to ultrasound-guided core biopsy of the 5:00 lesion.  This should provide pathologic confirmation of a benign process to support last year's negative cytology.  This will be arranged as a convenient date.  Breast exam is otherwise stable.  She is now 3 years status post management of her right breast cancer.  7 years status post management of her left breast cancer.  The patient is aware to call back for any questions or new concerns.    HPI, assessment, plan and physical exam has been scribed under the direction and in the presence of Robert Bellow, MD. Karie Fetch, RN  HPI, Physical Exam, Assessment and Plan have been scribed under the direction and in the presence of Hervey Ard, MD.  Gaspar Cola, CMA  I have completed the exam and reviewed the above documentation for accuracy and completeness.  I agree with the above.  Haematologist has been used and any errors in dictation or transcription are unintentional.  Hervey Ard, M.D., F.A.C.S.  Forest Gleason Aimee Timmons 02/12/2019, 8:53 PM

## 2019-02-13 ENCOUNTER — Ambulatory Visit: Payer: Self-pay | Admitting: Family Medicine

## 2019-02-17 ENCOUNTER — Other Ambulatory Visit: Payer: Self-pay

## 2019-02-17 ENCOUNTER — Encounter: Payer: Self-pay | Admitting: Family Medicine

## 2019-02-17 ENCOUNTER — Ambulatory Visit (INDEPENDENT_AMBULATORY_CARE_PROVIDER_SITE_OTHER): Payer: Medicare HMO | Admitting: Family Medicine

## 2019-02-17 VITALS — BP 139/84 | HR 76 | Temp 98.6°F | Resp 16 | Ht 66.0 in | Wt 224.0 lb

## 2019-02-17 DIAGNOSIS — E119 Type 2 diabetes mellitus without complications: Secondary | ICD-10-CM | POA: Diagnosis not present

## 2019-02-17 DIAGNOSIS — E7800 Pure hypercholesterolemia, unspecified: Secondary | ICD-10-CM

## 2019-02-17 DIAGNOSIS — I10 Essential (primary) hypertension: Secondary | ICD-10-CM

## 2019-02-17 LAB — POCT GLYCOSYLATED HEMOGLOBIN (HGB A1C)
Est. average glucose Bld gHb Est-mCnc: 183
Hemoglobin A1C: 8 % — AB (ref 4.0–5.6)

## 2019-02-17 NOTE — Progress Notes (Signed)
Patient: Leah Schaefer Female    DOB: 1941-01-14   78 y.o.   MRN: [id number] Visit Date: 02/17/2019  Today's Provider: [name], MD   Chief Complaint  Patient presents with  . Diabetes   Subjective:   HPI  Diabetes Mellitus Type II, Follow-up:   Lab Results  Component Value Date   HGBA1C 8.0 (A) 02/17/2019   HGBA1C 7.1 (H) 11/07/2018   HGBA1C 7.2 (H) 10/21/2018    Last seen for diabetes 3 months ago.  Management since then includes no changes. She reports good compliance with treatment. She is not having side effects.  Current symptoms include none and have been stable. Home blood sugar records: fasting range: 170s  Episodes of hypoglycemia? no   Most Recent Eye Exam: up to date Weight trend: stable Prior visit with dietician: No Current exercise: no regular exercise Current diet habits: well balanced  Pertinent Labs:    Component Value Date/Time   CHOL 103 11/07/2018 0317   CHOL 136 10/21/2018 1039   TRIG 94 11/07/2018 0317   HDL 32 (L) 11/07/2018 0317   HDL 34 (L) 10/21/2018 1039   LDLCALC 52 11/07/2018 0317   LDLCALC 84 10/21/2018 1039   CREATININE 0.87 11/22/2018 1300   CREATININE 0.92 09/04/2014 0916    Wt Readings from Last 3 Encounters:  02/17/19 224 lb (101.6 kg)  02/11/19 208 lb (94.3 kg)  11/13/18 205 lb (93 kg)    No Known Allergies   Current Outpatient Medications:  .  ACCU-CHEK FASTCLIX LANCETS MISC, CHECK SUGAR ONCE DAILY , Disp: 102 each, Rfl: 3 .  acetaminophen (TYLENOL) 500 MG tablet, Take 500 mg by mouth every 6 (six) hours as needed for mild pain or headache. , Disp: , Rfl:  .  acyclovir (ZOVIRAX) 200 MG capsule, TAKE 1 CAPSULE EVERY DAY, Disp: 90 capsule, Rfl: 0 .  Alcohol Swabs (B-D SINGLE USE SWABS REGULAR) PADS, CHECK SUGAR ONCE DAILY, Disp: 100 each, Rfl: 3 .  atorvastatin (LIPITOR) 40 MG tablet, Take 1 tablet (40 mg total) by mouth every evening., Disp: 90 tablet, Rfl: 3 .  bimatoprost (LUMIGAN) 0.01 %  SOLN, Place 1 drop into both eyes at bedtime. , Disp: , Rfl:  .  Blood Glucose Calibration (ACCU-CHEK SMARTVIEW CONTROL) LIQD, , Disp: , Rfl:  .  Blood Glucose Monitoring Suppl (ACCU-CHEK AVIVA PLUS) w/Device KIT, USE AS DIRECTED, Disp: 1 kit, Rfl: 0 .  Calcium Carb-Cholecalciferol (CALCIUM 600+D3) 600-800 MG-UNIT TABS, Take 1 tablet by mouth 2 (two) times daily., Disp: , Rfl:  .  cholecalciferol (VITAMIN D) 1000 units tablet, Take 1,000 Units by mouth daily., Disp: , Rfl:  .  cyanocobalamin 500 MCG tablet, Take 500 mcg by mouth daily., Disp: , Rfl:  .  gabapentin (NEURONTIN) 300 MG capsule, Take 2 capsules (600 mg total) by mouth at bedtime., Disp: 180 capsule, Rfl: 3 .  glucose blood (ACCU-CHEK SMARTVIEW) test strip, 1 each by Other route daily. Use as instructed, Disp: 100 each, Rfl: 3 .  Lancets (ACCU-CHEK SOFT TOUCH) lancets, Use as instructed, Disp: 100 each, Rfl: 12 .  latanoprost (XALATAN) 0.005 % ophthalmic solution, Place 1 drop into both eyes at bedtime. , Disp: , Rfl:  .  letrozole (FEMARA) 2.5 MG tablet, Take 1 tablet (2.5 mg total) by mouth daily., Disp: 90 tablet, Rfl: 3 .  meclizine (ANTIVERT) 25 MG tablet, Take 0.5 tablets (12.5 mg total) by mouth 3 (three) times daily as needed for dizziness., Disp:  15 tablet, Rfl: 0 .  meloxicam (MOBIC) 15 MG tablet, TAKE 1 TABLET EVERY DAY, Disp: 90 tablet, Rfl: 0 .  metFORMIN (GLUCOPHAGE) 500 MG tablet, TAKE 1 TABLET BY MOUTH TWICE DAILY WITH MEALS, Disp: 60 tablet, Rfl: 11 .  metoprolol tartrate (LOPRESSOR) 25 MG tablet, TAKE 1/2 TABLET TWICE DAILY, Disp: 90 tablet, Rfl: 3 .  Multiple Vitamin (MULTIVITAMIN WITH MINERALS) TABS tablet, Take 1 tablet by mouth daily., Disp: , Rfl:  .  omega-3 acid ethyl esters (LOVAZA) 1 g capsule, Take 3 g by mouth daily. , Disp: , Rfl:  .  pantoprazole (PROTONIX) 40 MG tablet, TAKE 1 TABLET TWICE DAILY, Disp: 180 tablet, Rfl: 3 .  pyridoxine (B-6) 100 MG tablet, Take 100 mg by mouth daily., Disp: , Rfl:  .   sertraline (ZOLOFT) 50 MG tablet, Take 1 tablet (50 mg total) by mouth daily., Disp: 30 tablet, Rfl: 3 .  vitamin C (ASCORBIC ACID) 500 MG tablet, Take 1,000 mg by mouth daily., Disp: , Rfl:  .  vitamin E 400 UNIT capsule, Take 400 Units by mouth daily., Disp: , Rfl:   Review of Systems  Constitutional: Negative for activity change, appetite change, fatigue and unexpected weight change.  HENT: Negative.   Eyes: Negative.   Respiratory: Negative for cough and shortness of breath.   Cardiovascular: Negative for chest pain, palpitations and leg swelling.  Gastrointestinal: Negative.   Endocrine: Negative for cold intolerance, heat intolerance, polydipsia, polyphagia and polyuria.  Allergic/Immunologic: Negative.   Neurological: Negative for dizziness, light-headedness and headaches.  Psychiatric/Behavioral: Negative for agitation, self-injury, sleep disturbance and suicidal ideas. The patient is not nervous/anxious.     Social History   Tobacco Use  . Smoking status: Never Smoker  . Smokeless tobacco: Never Used  Substance Use Topics  . Alcohol use: No      Objective:   BP 139/84 (BP Location: Right Arm, Patient Position: Sitting, Cuff Size: Large)   Pulse 76   Temp 98.6 F (37 C)   Resp 16   Ht _0  (1.676 m)   Wt 224 lb (101.6 kg)   SpO2 99%   BMI 36.15 kg/m  Vitals:   02/17/19 1354  BP: 139/84  Pulse: 76  Resp: 16  Temp: 98.6 F (37 C)  SpO2: 99%  Weight: 224 lb (101.6 kg)  Height: _1  (1.676 m)     Physical Exam Vitals signs reviewed.  Constitutional:      Appearance: She is well-developed.  HENT:     Head: Normocephalic and atraumatic.     Right Ear: External ear normal.     Left Ear: External ear normal.     Nose: Nose normal.  Eyes:     General: No scleral icterus. Neck:     Thyroid: No thyromegaly.  Cardiovascular:     Rate and Rhythm: Normal rate and regular rhythm.     Heart sounds: Normal heart sounds.  Pulmonary:     Effort: Pulmonary  effort is normal.     Breath sounds: Normal breath sounds.  Abdominal:     Palpations: Abdomen is soft.  Skin:    General: Skin is warm and dry.  Neurological:     Mental Status: She is alert and oriented to person, place, and time.  Psychiatric:        Behavior: Behavior normal.        Thought Content: Thought content normal.        Judgment: Judgment normal.  Results for orders placed or performed in visit on 02/17/19  POCT glycosylated hemoglobin (Hb A1C)  Result Value Ref Range   Hemoglobin A1C 8.0 (A) 4.0 - 5.6 %   HbA1c POC (<> result, manual entry)     HbA1c, POC (prediabetic range)     HbA1c, POC (controlled diabetic range)     Est. average glucose Bld gHb Est-mCnc 183        Assessment & Plan    1. Controlled type 2 diabetes mellitus without complication, without long-term current use of insulin (HCC) I attribute todays higher  Level with 16 lb weight gain over past 4 months.Lifestyle stressed. RTC 4 months. - POCT glycosylated hemoglobin (Hb A1C)--8.0 today--up from 7.1  2. Essential (primary) hypertension Controlled.  3. Hypercholesteremia On Lovaza.     Glendola Friedhoff Cranford Mon, MD  [facility]

## 2019-02-20 ENCOUNTER — Ambulatory Visit: Payer: Medicare HMO | Admitting: General Surgery

## 2019-03-06 ENCOUNTER — Ambulatory Visit: Payer: Medicare HMO | Admitting: General Surgery

## 2019-03-11 ENCOUNTER — Other Ambulatory Visit: Payer: Self-pay | Admitting: Surgery

## 2019-03-11 DIAGNOSIS — R928 Other abnormal and inconclusive findings on diagnostic imaging of breast: Secondary | ICD-10-CM

## 2019-03-11 DIAGNOSIS — N6320 Unspecified lump in the left breast, unspecified quadrant: Secondary | ICD-10-CM

## 2019-03-12 ENCOUNTER — Ambulatory Visit: Payer: Medicare HMO | Admitting: Surgery

## 2019-03-20 ENCOUNTER — Ambulatory Visit
Admission: RE | Admit: 2019-03-20 | Discharge: 2019-03-20 | Disposition: A | Discharge status: Home / Self Care | Payer: Medicare HMO | Source: Ambulatory Visit | Attending: Surgery | Admitting: Surgery

## 2019-03-20 ENCOUNTER — Other Ambulatory Visit: Payer: Self-pay

## 2019-03-20 DIAGNOSIS — N6320 Unspecified lump in the left breast, unspecified quadrant: Secondary | ICD-10-CM

## 2019-03-20 DIAGNOSIS — R928 Other abnormal and inconclusive findings on diagnostic imaging of breast: Secondary | ICD-10-CM | POA: Insufficient documentation

## 2019-03-20 DIAGNOSIS — N6322 Unspecified lump in the left breast, upper inner quadrant: Secondary | ICD-10-CM | POA: Diagnosis not present

## 2019-03-20 DIAGNOSIS — Z853 Personal history of malignant neoplasm of breast: Secondary | ICD-10-CM | POA: Diagnosis not present

## 2019-03-20 DIAGNOSIS — N641 Fat necrosis of breast: Secondary | ICD-10-CM | POA: Diagnosis not present

## 2019-03-20 DIAGNOSIS — N6323 Unspecified lump in the left breast, lower outer quadrant: Secondary | ICD-10-CM | POA: Diagnosis not present

## 2019-03-20 HISTORY — PX: BREAST BIOPSY: SHX20

## 2019-03-21 LAB — SURGICAL PATHOLOGY

## 2019-03-24 ENCOUNTER — Telehealth: Payer: Self-pay | Admitting: *Deleted

## 2019-03-24 DIAGNOSIS — C50211 Malignant neoplasm of upper-inner quadrant of right female breast: Secondary | ICD-10-CM

## 2019-03-24 NOTE — Telephone Encounter (Signed)
Notified patient as instructed, patient pleased.No cancer. Discussed follow-up appointments, patient agrees

## 2019-04-04 ENCOUNTER — Other Ambulatory Visit: Payer: Self-pay | Admitting: Family Medicine

## 2019-04-04 NOTE — Telephone Encounter (Signed)
[facility] faxed refill request for the following medications:  acyclovir (ZOVIRAX) 200 MG capsule  Last Rx: 12/25/2018 LOV: 02/17/2019 Please advise. Thanks TNP

## 2019-04-08 MED ORDER — ACYCLOVIR 200 MG PO CAPS: 200.0000 mg | ORAL_CAPSULE | Freq: Every day | ORAL | 3 refills | Status: DC

## 2019-04-09 ENCOUNTER — Ambulatory Visit (INDEPENDENT_AMBULATORY_CARE_PROVIDER_SITE_OTHER): Payer: Medicare HMO | Admitting: Surgery

## 2019-04-09 ENCOUNTER — Encounter: Payer: Self-pay | Admitting: Surgery

## 2019-04-09 ENCOUNTER — Other Ambulatory Visit: Payer: Self-pay

## 2019-04-09 VITALS — BP 159/83 | HR 89 | Temp 97.7°F | Resp 12 | Ht 66.0 in | Wt 201.0 lb

## 2019-04-09 DIAGNOSIS — C50211 Malignant neoplasm of upper-inner quadrant of right female breast: Secondary | ICD-10-CM | POA: Diagnosis not present

## 2019-04-09 DIAGNOSIS — Z170 Estrogen receptor positive status [ER+]: Secondary | ICD-10-CM

## 2019-04-09 DIAGNOSIS — N641 Fat necrosis of breast: Secondary | ICD-10-CM

## 2019-04-09 NOTE — Progress Notes (Signed)
Outpatient Surgical Follow Up  04/09/2019  Leah Schaefer is an 78 y.o. female.   Chief Complaint  Patient presents with  . Other    follow up left breast     HPI: Leah Schaefer is a 78 year old female with a history of bilateral breast cancer.  She is status post bilateral lumpectomies ( 2013 and 2017) and sentinel lymph node biopsies.  On the right side her cancer was ER PR positive and HER-2 negative and on the left side it was triple negative.  She did get chemotherapy as well as radiation therapy.  On recent mammogram that I have personally reviewed there was evidence of an asymmetry and she did undergo image guided biopsy showing evidence of fat necrosis.  She specifically denies any breast masses, no pain, no nipple discharge and no weight loss.  No fevers no chills. She is being followed by Dr. Rogue Bussing and is on Femara    Past Medical History:  Diagnosis Date  . Breast cancer ([place])   . Breast cancer of upper-inner quadrant of right female breast ([place]) 09/21/2015   T1bN0 " 3m; ER100%, PR 90%, HER-2/neu not overexpressed.  . Cystitis   . Diabetes mellitus without complication (HCC)    NO MEDS-DIET CONTROLLED  . GERD (gastroesophageal reflux disease)   . Hernia 2011  . Hyperlipidemia   . Hypertension   . Malignant neoplasm of upper-inner quadrant of female breast ([place] 08/17/2011   Left, T2 (2.3 cm) N0, triple negative. Chemotherapy/post wide excision whole breast radiation.  . 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 Personal history of chemotherapy 2013  . Personal history of malignant neoplasm of breast   . Personal history of radiation therapy 2017    Rt  . Vertigo     Past Surgical History:  Procedure Laterality Date  . BREAST BIOPSY Left 07/18/2011   +  . BREAST BIOPSY Left 09/21/2015   neg  . BREAST BIOPSY Left 04/05/2016   neg  . BREAST BIOPSY Left 03/20/2019   uKoreabx 2 areas path pending 9:30 ribbon  5:30 coil  . BREAST  EXCISIONAL BIOPSY Right 2017   +  . BREAST LUMPECTOMY Right 09/30/2015  . BREAST LUMPECTOMY Left 08/17/2011  . BREAST LUMPECTOMY WITH SENTINEL LYMPH NODE BIOPSY Right 09/30/2015   Procedure: BREAST LUMPECTOMY WITH SENTINEL LYMPH NODE BX;  Surgeon: JRobert Bellow MD;  Location: [facility] ORS;  Service: General;  Laterality: Right;  . BREAST SURGERY Left 2012   wide excision  . BREAST SURGERY Left November 2013   Core biopsy of the upper-outer quadrant showed fat necrosis.  . CHOLECYSTECTOMY  2007  . COLONOSCOPY  2011   Dr OCandace Cruise . COLONOSCOPY WITH PROPOFOL N/A 02/25/2015   Procedure: COLONOSCOPY WITH PROPOFOL;  Surgeon: PHulen Luster MD;  [facility];  Service: Gastroenterology;  Laterality: N/A;  . DILATION AND CURETTAGE OF UTERUS  2004  . HERNIA REPAIR Right 040/98/1191  Umbilical/ventral hernia repaired with 6.4 cm Proceed ventral patch  . PORT-A-CATH REMOVAL    . PORTACATH PLACEMENT  2013  . 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  .Marland KitchenUPPER GI ENDOSCOPY  2011    Family History  Problem Relation Age of Onset  . Lymphoma Father   . Ovarian cancer Sister   . Colon cancer Brother   . Lymphoma Brother   .  Cancer Other        stomach  . Cancer Other        stomach  . Breast cancer Neg Hx     Social History:  reports that she has never smoked. She has never used smokeless tobacco. She reports that she does not drink alcohol or use drugs.  Allergies: No Known Allergies  Medications reviewed.    ROS Full ROS performed and is otherwise negative other than what is stated in HPI   BP (!) 159/83   Pulse 89   Temp 97.7 F (36.5 C)   Resp 12   Ht '5\' 6"'  (1.676 m)   Wt 201 lb (91.2 kg)   BMI 32.44 kg/m   Physical Exam Vitals signs and nursing note reviewed. Exam conducted with a chaperone present.  Constitutional:      General: She is not in acute  distress.    Appearance: Normal appearance. She is normal weight.  Cardiovascular:     Rate and Rhythm: Normal rate and regular rhythm.     Pulses: Normal pulses.     Heart sounds: Normal heart sounds. No murmur.  Pulmonary:     Effort: Pulmonary effort is normal. No respiratory distress.     Breath sounds: No stridor. No wheezing.     Comments: BREAST: Bilateral lumpectomy sites.  There is no evidence of new breast masses or lesions.  No evidence of discharge and no evidence of lymphadenopathy Abdominal:     General: Abdomen is flat. There is no distension.     Palpations: There is no mass.     Tenderness: There is no abdominal tenderness. There is no rebound.     Hernia: No hernia is present.  Musculoskeletal: Normal range of motion.        General: No swelling.  Skin:    General: Skin is warm and dry.     Capillary Refill: Capillary refill takes less than 2 seconds.     Coloration: Skin is not jaundiced.  Neurological:     General: No focal deficit present.     Mental Status: She is alert and oriented to person, place, and time.  Psychiatric:        Mood and Affect: Mood normal.        Behavior: Behavior normal.        Thought Content: Thought content normal.        Judgment: Judgment normal.       Assessment/Plan: 78 year old female with history of breast cancer and recent asymmetry in the left breast status post biopsy showing evidence of fat necrosis.  No evidence of carcinoma or malignancy.  Discussed with patient in detail and I do recommend a follow-up in 6 months with both physical exam and mammogram and ultrasound.    Greater than 50% of the 25 minutes  visit was spent in counseling/coordination of care   Caroleen Hamman, MD [facility]

## 2019-04-09 NOTE — Patient Instructions (Signed)
We will call you in January to schedule your Mammogram for February 2021.

## 2019-05-22 ENCOUNTER — Telehealth: Payer: Self-pay | Admitting: *Deleted

## 2019-05-22 NOTE — Telephone Encounter (Signed)
Spoke with patient to change her apts dates with Dr. Pearson Grippe an apt with NP as Dr. B is not here. Pt would prefer to r/s out to Jan 2021 with Dr. B instead of seeing NP.  r/s apts from 10/19 to Jan 6'th due to Dr. Sharmaine Base pal and patient's preference

## 2019-05-28 ENCOUNTER — Ambulatory Visit: Payer: Medicare HMO | Admitting: Internal Medicine

## 2019-05-28 ENCOUNTER — Other Ambulatory Visit: Payer: Medicare HMO

## 2019-06-20 NOTE — Progress Notes (Signed)
Patient: Leah Schaefer Female    DOB: [birth year]   78 y.o.   MRN: [id number] Visit Date: 06/24/2019  Today's Provider: [name], MD   Chief Complaint  Patient presents with  . Follow-up  . Diabetes  . Hypertension  . Hyperlipidemia   Subjective:     HPI  Patient's husband has had health issues but otherwise she is doing well.   Controlled type 2 diabetes mellitus without complication, without long-term current use of insulin ([place]) From 02/17/2019-Lifestyle stressed. RTC 4 months. Hb A1C 8.0.  Home blood sugar readings 145-200.  Essential (primary) hypertension From 02/17/2019-Controlled.  Hypercholesteremia From 02/17/2019-On Lovaza    No Known Allergies   Current Outpatient Medications:  .  ACCU-CHEK FASTCLIX LANCETS MISC, CHECK SUGAR ONCE DAILY , Disp: 102 each, Rfl: 3 .  acetaminophen (TYLENOL) 500 MG tablet, Take 500 mg by mouth every 6 (six) hours as needed for mild pain or headache. , Disp: , Rfl:  .  acyclovir (ZOVIRAX) 200 MG capsule, Take 1 capsule (200 mg total) by mouth daily., Disp: 90 capsule, Rfl: 3 .  Alcohol Swabs (B-D SINGLE USE SWABS REGULAR) PADS, CHECK SUGAR ONCE DAILY, Disp: 100 each, Rfl: 3 .  atorvastatin (LIPITOR) 40 MG tablet, Take 1 tablet (40 mg total) by mouth every evening., Disp: 90 tablet, Rfl: 3 .  bimatoprost (LUMIGAN) 0.01 % SOLN, Place 1 drop into both eyes at bedtime. , Disp: , Rfl:  .  Blood Glucose Calibration (ACCU-CHEK SMARTVIEW CONTROL) LIQD, , Disp: , Rfl:  .  Blood Glucose Monitoring Suppl (ACCU-CHEK AVIVA PLUS) w/Device KIT, USE AS DIRECTED, Disp: 1 kit, Rfl: 0 .  Calcium Carb-Cholecalciferol (CALCIUM 600+D3) 600-800 MG-UNIT TABS, Take 1 tablet by mouth 2 (two) times daily., Disp: , Rfl:  .  cholecalciferol (VITAMIN D) 1000 units tablet, Take 1,000 Units by mouth daily., Disp: , Rfl:  .  cyanocobalamin 500 MCG tablet, Take 500 mcg by mouth daily., Disp: , Rfl:  .  gabapentin (NEURONTIN) 300 MG capsule,  Take 2 capsules (600 mg total) by mouth at bedtime., Disp: 180 capsule, Rfl: 3 .  glucose blood (ACCU-CHEK SMARTVIEW) test strip, 1 each by Other route daily. Use as instructed, Disp: 100 each, Rfl: 3 .  Lancets (ACCU-CHEK SOFT TOUCH) lancets, Use as instructed, Disp: 100 each, Rfl: 12 .  letrozole (FEMARA) 2.5 MG tablet, Take 1 tablet (2.5 mg total) by mouth daily., Disp: 90 tablet, Rfl: 3 .  meclizine (ANTIVERT) 25 MG tablet, Take 0.5 tablets (12.5 mg total) by mouth 3 (three) times daily as needed for dizziness., Disp: 15 tablet, Rfl: 0 .  meloxicam (MOBIC) 15 MG tablet, TAKE 1 TABLET EVERY DAY, Disp: 90 tablet, Rfl: 0 .  metFORMIN (GLUCOPHAGE) 500 MG tablet, TAKE 1 TABLET BY MOUTH TWICE DAILY WITH MEALS, Disp: 60 tablet, Rfl: 11 .  metoprolol tartrate (LOPRESSOR) 25 MG tablet, TAKE 1/2 TABLET TWICE DAILY, Disp: 90 tablet, Rfl: 3 .  Multiple Vitamin (MULTIVITAMIN WITH MINERALS) TABS tablet, Take 1 tablet by mouth daily., Disp: , Rfl:  .  omega-3 acid ethyl esters (LOVAZA) 1 g capsule, Take 3 g by mouth daily. , Disp: , Rfl:  .  pantoprazole (PROTONIX) 40 MG tablet, TAKE 1 TABLET TWICE DAILY, Disp: 180 tablet, Rfl: 3 .  pyridoxine (B-6) 100 MG tablet, Take 100 mg by mouth daily., Disp: , Rfl:  .  sertraline (ZOLOFT) 50 MG tablet, Take 1 tablet (50 mg total) by mouth daily., Disp:  30 tablet, Rfl: 3 .  vitamin C (ASCORBIC ACID) 500 MG tablet, Take 1,000 mg by mouth daily., Disp: , Rfl:  .  vitamin E 400 UNIT capsule, Take 400 Units by mouth daily., Disp: , Rfl:  .  latanoprost (XALATAN) 0.005 % ophthalmic solution, Place 1 drop into both eyes at bedtime. , Disp: , Rfl:   Review of Systems  Constitutional: Negative for appetite change, chills, fatigue and fever.  Eyes: Negative.   Respiratory: Negative for chest tightness and shortness of breath.   Cardiovascular: Negative for chest pain and palpitations.  Gastrointestinal: Negative for abdominal pain, nausea and vomiting.  Endocrine: Negative.    Allergic/Immunologic: Negative.   Neurological: Negative for dizziness and weakness.  Psychiatric/Behavioral: Negative.     Social History   Tobacco Use  . Smoking status: Never Smoker  . Smokeless tobacco: Never Used  Substance Use Topics  . Alcohol use: No      Objective:   BP 130/64 (BP Location: Left Arm, Patient Position: Sitting, Cuff Size: Large)   Pulse 60   Temp (!) 96.8 F (36 C) (Other (Comment))   Resp 18   Ht '5\' 6"'  (1.676 m)   Wt 200 lb (90.7 kg)   SpO2 97%   BMI 32.28 kg/m  Vitals:   06/24/19 1343  BP: 130/64  Pulse: 60  Resp: 18  Temp: (!) 96.8 F (36 C)  TempSrc: Other (Comment)  SpO2: 97%  Weight: 200 lb (90.7 kg)  Height: '5\' 6"'  (1.676 m)  Body mass index is 32.28 kg/m.   Physical Exam Vitals signs reviewed.  Constitutional:      Appearance: She is well-developed.  HENT:     Head: Normocephalic and atraumatic.     Right Ear: External ear normal.     Left Ear: External ear normal.     Nose: Nose normal.  Eyes:     General: No scleral icterus. Neck:     Thyroid: No thyromegaly.  Cardiovascular:     Rate and Rhythm: Normal rate and regular rhythm.     Heart sounds: Normal heart sounds.  Pulmonary:     Effort: Pulmonary effort is normal.     Breath sounds: Normal breath sounds.  Abdominal:     Palpations: Abdomen is soft.  Skin:    General: Skin is warm and dry.  Neurological:     Mental Status: She is alert and oriented to person, place, and time.  Psychiatric:        Behavior: Behavior normal.        Thought Content: Thought content normal.        Judgment: Judgment normal.      Results for orders placed or performed in visit on 06/24/19  POCT HgB A1C  Result Value Ref Range   Hemoglobin A1C 7.7 (A) 4.0 - 5.6 %   Est. average glucose Bld gHb Est-mCnc 174        Assessment & Plan    1. Controlled type 2 diabetes mellitus without complication, without long-term current use of insulin (HCC) stable control with an A1c  of 7.7. Patient advised to continue to work on diet and exercise.  Check microalbumin on next visit - POCT HgB A1C  2. Essential (primary) hypertension Metoprolol  3. Hypercholesteremia On atorvastatin  4. Need for influenza vaccination  - Flu Vaccine QUAD High Dose(Fluad)   Follow up in 4 months.     Richard Cranford Mon, MD  [facility]

## 2019-06-23 ENCOUNTER — Ambulatory Visit: Payer: Self-pay | Admitting: Family Medicine

## 2019-06-24 ENCOUNTER — Encounter: Payer: Self-pay | Admitting: Family Medicine

## 2019-06-24 ENCOUNTER — Ambulatory Visit (INDEPENDENT_AMBULATORY_CARE_PROVIDER_SITE_OTHER): Payer: Medicare HMO | Admitting: Family Medicine

## 2019-06-24 ENCOUNTER — Other Ambulatory Visit: Payer: Self-pay

## 2019-06-24 VITALS — BP 130/64 | HR 60 | Temp 96.8°F | Resp 18 | Ht 66.0 in | Wt 200.0 lb

## 2019-06-24 DIAGNOSIS — I10 Essential (primary) hypertension: Secondary | ICD-10-CM | POA: Diagnosis not present

## 2019-06-24 DIAGNOSIS — E7800 Pure hypercholesterolemia, unspecified: Secondary | ICD-10-CM | POA: Diagnosis not present

## 2019-06-24 DIAGNOSIS — Z23 Encounter for immunization: Secondary | ICD-10-CM | POA: Diagnosis not present

## 2019-06-24 DIAGNOSIS — E119 Type 2 diabetes mellitus without complications: Secondary | ICD-10-CM

## 2019-06-24 LAB — POCT GLYCOSYLATED HEMOGLOBIN (HGB A1C)
Est. average glucose Bld gHb Est-mCnc: 174
Hemoglobin A1C: 7.7 % — AB (ref 4.0–5.6)

## 2019-06-25 ENCOUNTER — Encounter: Payer: Self-pay | Admitting: Radiation Oncology

## 2019-06-25 ENCOUNTER — Other Ambulatory Visit: Payer: Self-pay

## 2019-06-25 ENCOUNTER — Ambulatory Visit
Admission: RE | Admit: 2019-06-25 | Discharge: 2019-06-25 | Disposition: A | Discharge status: Home / Self Care | Payer: Medicare HMO | Source: Ambulatory Visit | Attending: Radiation Oncology | Admitting: Radiation Oncology

## 2019-06-25 VITALS — BP 140/80 | HR 80 | Temp 98.3°F | Resp 18 | Wt 193.4 lb

## 2019-06-25 DIAGNOSIS — Z79811 Long term (current) use of aromatase inhibitors: Secondary | ICD-10-CM | POA: Diagnosis not present

## 2019-06-25 DIAGNOSIS — Z923 Personal history of irradiation: Secondary | ICD-10-CM | POA: Diagnosis not present

## 2019-06-25 DIAGNOSIS — C50211 Malignant neoplasm of upper-inner quadrant of right female breast: Secondary | ICD-10-CM

## 2019-06-25 DIAGNOSIS — Z170 Estrogen receptor positive status [ER+]: Secondary | ICD-10-CM | POA: Insufficient documentation

## 2019-06-25 NOTE — Progress Notes (Signed)
Radiation Oncology Follow up Note  Name: Leah Schaefer   Date:   06/25/2019 MRN:  [id number] DOB: 1941/05/23    This 78 y.o. 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  She is seen today in routine follow-up and is doing well she specifically denies breast tenderness cough or bone pain.  She is currently on Femara tolerating that well without side effect.  Patient is also status post lumpectomy and whole breast radiation to her left breast back in 2013 for stage II triple negative breast cancer..  Her most recent mammograms were back in June showing a 5 mm mass in the 9:30 position of the left breast slightly smaller comparable to prior examination.  She also had indeterminate 7 mm mass at the 5:30 position of the left breast 2 cm from the nipple without significant change.  Malignancy cannot be confirmed although fat necrosis was considered.  Patient did have biopsy of the left breast in August showing dense fibrous tissue consistent with fat necrosis negative for carcinoma.  This was both the 930 lesion as well as the 530 lesion of the left breast.  COMPLICATIONS OF TREATMENT: none  FOLLOW UP COMPLIANCE: keeps appointments   PHYSICAL EXAM:  BP 140/80 (BP Location: Left Arm, Patient Position: Sitting)   Pulse 80   Temp 98.3 F (36.8 C) (Tympanic)   Resp 18   Wt 193 lb 6.4 oz (87.7 kg)   BMI 31.22 kg/m  Lungs are clear to A&P cardiac examination essentially unremarkable with regular rate and rhythm. No dominant mass or nodularity is noted in either breast in 2 positions examined. Incision is well-healed. No axillary  or supraclavicular adenopathy is appreciated. Cosmetic result is excellent.  Well-developed well-nourished patient in NAD. HEENT reveals PERLA, EOMI, discs not visualized.  Oral cavity is clear. No oral mucosal lesions are identified. Neck is clear without evidence of cervical or supraclavicular adenopathy. Lungs are clear to A&P. Cardiac examination is essentially unremarkable with regular rate and rhythm without murmur rub or thrill. Abdomen is benign with no organomegaly or masses noted. Motor sensory and DTR levels are equal and symmetric in the upper and lower extremities. Cranial nerves II through XII are grossly intact. Proprioception is intact. No peripheral adenopathy or edema is identified. No motor or sensory levels are noted. Crude visual fields are within normal range.  RADIOLOGY RESULTS: Mammograms and ultrasound reviewed compatible with above-stated findings  PLAN: Present time patient continues to do well with no evidence of disease.  Biopsy of the left breast was benign.  I am otherwise pleased with her overall progress.  I have asked to see her back in 1 year for follow-up.  She continues on Femara without side effect.  Patient is to call with any concerns.  I would like to take this opportunity to thank you for allowing me to participate in the care of your patient.Noreene Filbert, MD

## 2019-07-02 ENCOUNTER — Ambulatory Visit: Payer: Self-pay | Admitting: Family Medicine

## 2019-07-29 ENCOUNTER — Telehealth: Payer: Self-pay | Admitting: Family Medicine

## 2019-07-29 NOTE — Telephone Encounter (Signed)
Medication Refill - Medication:  letrozole (FEMARA) 2.5 MG tablet   Has the patient contacted their pharmacy? Yes advised to call office. Pt would like nurse to also check in with [facility] and see why they stated pt does not have this medication anymore. Please advise.   Preferred Pharmacy (with phone number or street name):  [facility] [address] Phone:  [phone]  Fax:  [phone]     Agent: Please be advised that RX refills may take up to 3 business days. We ask that you follow-up with your pharmacy.

## 2019-07-29 NOTE — Telephone Encounter (Signed)
Please see encounter below

## 2019-07-30 ENCOUNTER — Other Ambulatory Visit: Payer: Self-pay

## 2019-07-30 ENCOUNTER — Other Ambulatory Visit: Payer: Self-pay | Admitting: Family Medicine

## 2019-07-30 MED ORDER — LETROZOLE 2.5 MG PO TABS: 2.5000 mg | ORAL_TABLET | Freq: Every day | ORAL | 3 refills | Status: DC

## 2019-07-30 NOTE — Telephone Encounter (Signed)
Requested medication (s) are due for refill today: yes  Requested medication (s) are on the active medication list: yes  Last refill: 05/28/2018  Future visit scheduled: yes  Notes to clinic: Medication not assigned to a protocol, review manually. Patient would like medication sent to local and mail order pharmacy   Requested Prescriptions  Pending Prescriptions Disp Refills   letrozole (FEMARA) 2.5 MG tablet 90 tablet 3    Sig: Take 1 tablet (2.5 mg total) by mouth daily.      Off-Protocol Failed - 07/30/2019 10:08 AM      Failed - Medication not assigned to a protocol, review manually.      Passed - Valid encounter within last 12 months    Recent Outpatient Visits           1 month ago Controlled type 2 diabetes mellitus without complication, without long-term current use of insulin [facility])   [facility] Jerrol Banana., MD   5 months ago Controlled type 2 diabetes mellitus without complication, without long-term current use of insulin [facility])   [facility] Jerrol Banana., MD   8 months ago TIA (transient ischemic attack)   [facility] Jerrol Banana., MD   9 months ago Essential (primary) hypertension   [facility] Jerrol Banana., MD   1 year ago Type 2 diabetes mellitus with complication, without long-term current use of insulin [facility])   [facility] Jerrol Banana., MD       Future Appointments             In 2 months Jerrol Banana., MD [facility]

## 2019-07-30 NOTE — Telephone Encounter (Signed)
Pt calling and is requesting to know why her medication was denied. Pt is requesting to have a short supply sent to her local pharmacy until she can receive full prescription from [facility]. Please advise.     [place] [address] [place] [address]  Phone: [phone] Fax: [phone]  Not a 24 hour pharmacy; exact hours not known.

## 2019-07-30 NOTE — Telephone Encounter (Signed)
Pt request refill   letrozole [facility]) 2.5 MG tablet  90 day to   [place], [place] Phone:  [phone]  Fax:  [phone]     However, pt is OUT of her medication, and needs a 15 day supply sent to local   [place]), [place] Phone:  [phone]  Fax:  [phone]     Pt states this is her cancer med and she needs.

## 2019-08-13 ENCOUNTER — Inpatient Hospital Stay: Payer: Medicare HMO

## 2019-08-13 ENCOUNTER — Inpatient Hospital Stay: Payer: Medicare HMO | Admitting: Internal Medicine

## 2019-08-14 ENCOUNTER — Other Ambulatory Visit: Payer: Self-pay

## 2019-08-14 DIAGNOSIS — C50211 Malignant neoplasm of upper-inner quadrant of right female breast: Secondary | ICD-10-CM

## 2019-08-14 DIAGNOSIS — Z170 Estrogen receptor positive status [ER+]: Secondary | ICD-10-CM

## 2019-09-01 ENCOUNTER — Other Ambulatory Visit: Payer: Self-pay | Admitting: General Surgery

## 2019-09-01 DIAGNOSIS — C50411 Malignant neoplasm of upper-outer quadrant of right female breast: Secondary | ICD-10-CM

## 2019-09-01 DIAGNOSIS — Z170 Estrogen receptor positive status [ER+]: Secondary | ICD-10-CM

## 2019-09-02 NOTE — Addendum Note (Signed)
Addended byHervey Ard on: 09/02/2019 02:52 PM   Modules accepted: Orders

## 2019-09-08 ENCOUNTER — Telehealth: Payer: Self-pay | Admitting: Family Medicine

## 2019-09-08 DIAGNOSIS — I10 Essential (primary) hypertension: Secondary | ICD-10-CM

## 2019-09-08 DIAGNOSIS — K2970 Gastritis, unspecified, without bleeding: Secondary | ICD-10-CM

## 2019-09-08 MED ORDER — METOPROLOL TARTRATE 25 MG PO TABS: 12.5000 mg | ORAL_TABLET | Freq: Two times a day (BID) | ORAL | 3 refills | Status: DC

## 2019-09-08 MED ORDER — PANTOPRAZOLE SODIUM 40 MG PO TBEC: 40.0000 mg | DELAYED_RELEASE_TABLET | Freq: Two times a day (BID) | ORAL | 3 refills | Status: DC

## 2019-09-08 NOTE — Telephone Encounter (Signed)
[facility] faxed refill request for the following medications:  metoprolol tartrate (LOPRESSOR) 25 MG tablet pantoprazole (PROTONIX) 40 MG tablet  Please advise.  Thanks, [employer]

## 2019-09-11 MED ORDER — PANTOPRAZOLE SODIUM 40 MG PO TBEC: 40.0000 mg | DELAYED_RELEASE_TABLET | Freq: Two times a day (BID) | ORAL | 3 refills | Status: DC

## 2019-09-11 NOTE — Telephone Encounter (Signed)
Patient's prescription request has been approved and sent to [facility]

## 2019-09-11 NOTE — Telephone Encounter (Signed)
Pt stated that [facility] called and stated that they have not received approval to refill the pantoprazole (PROTONIX) 40 MG tablet. Pt asked that the refill request be sent asap.

## 2019-09-11 NOTE — Addendum Note (Signed)
Addended by: Gerald Stabs on: 09/11/2019 02:20 PM   Modules accepted: Orders

## 2019-09-12 NOTE — Telephone Encounter (Signed)
Pt called to provide fax number: [phone]

## 2019-09-23 ENCOUNTER — Ambulatory Visit
Admission: RE | Admit: 2019-09-23 | Discharge: 2019-09-23 | Disposition: A | Discharge status: Home / Self Care | Payer: Medicare HMO | Source: Ambulatory Visit | Attending: Surgery | Admitting: Surgery

## 2019-09-23 DIAGNOSIS — C50411 Malignant neoplasm of upper-outer quadrant of right female breast: Secondary | ICD-10-CM | POA: Diagnosis not present

## 2019-09-23 DIAGNOSIS — R928 Other abnormal and inconclusive findings on diagnostic imaging of breast: Secondary | ICD-10-CM | POA: Diagnosis not present

## 2019-09-23 DIAGNOSIS — Z170 Estrogen receptor positive status [ER+]: Secondary | ICD-10-CM | POA: Insufficient documentation

## 2019-09-30 ENCOUNTER — Other Ambulatory Visit: Payer: Self-pay

## 2019-09-30 DIAGNOSIS — Z170 Estrogen receptor positive status [ER+]: Secondary | ICD-10-CM | POA: Diagnosis not present

## 2019-09-30 DIAGNOSIS — Z923 Personal history of irradiation: Secondary | ICD-10-CM | POA: Diagnosis not present

## 2019-09-30 DIAGNOSIS — Z9221 Personal history of antineoplastic chemotherapy: Secondary | ICD-10-CM | POA: Diagnosis not present

## 2019-09-30 DIAGNOSIS — C50411 Malignant neoplasm of upper-outer quadrant of right female breast: Secondary | ICD-10-CM | POA: Diagnosis not present

## 2019-09-30 DIAGNOSIS — I10 Essential (primary) hypertension: Secondary | ICD-10-CM

## 2019-09-30 MED ORDER — ATORVASTATIN CALCIUM 40 MG PO TABS: 40.0000 mg | ORAL_TABLET | Freq: Every evening | ORAL | 3 refills | Status: DC

## 2019-10-01 ENCOUNTER — Ambulatory Visit: Payer: Medicare HMO | Admitting: Surgery

## 2019-10-13 NOTE — Progress Notes (Signed)
Patient: Leah Schaefer Female    DOB: 10/29/1940   79 y.o.   MRN: [id number] Visit Date: 10/14/2019  Today's Provider: [name], MD   Chief Complaint  Patient presents with  . Hypertension  . Diabetes Mellitus  . Hyperlipidemia   Subjective:     HPI  Overall patient doing well.  Her husband of 3 years died August 20, 2019 that has been very difficult.  Overall she is doing well.  He takes her medications and is trying to work on her habits. Controlled type 2 diabetes mellitus without complication, without long-term current use of insulin (HCC) stable control with an A1c of 7.7. From 06/24/2019-Patient advised to continue to work on diet and exercise.  Check microalbumin on next visit. HgB A1C 7.7.  Essential (primary) hypertension From 06/24/2019-Metoprolol.  Hypercholesteremia From 06/24/2019-On atorvastatin.   No Known Allergies   Current Outpatient Medications:  .  ACCU-CHEK FASTCLIX LANCETS MISC, CHECK SUGAR ONCE DAILY , Disp: 102 each, Rfl: 3 .  acetaminophen (TYLENOL) 500 MG tablet, Take 500 mg by mouth every 6 (six) hours as needed for mild pain or headache. , Disp: , Rfl:  .  Alcohol Swabs (B-D SINGLE USE SWABS REGULAR) PADS, CHECK SUGAR ONCE DAILY, Disp: 100 each, Rfl: 3 .  atorvastatin (LIPITOR) 40 MG tablet, Take 1 tablet (40 mg total) by mouth every evening., Disp: 90 tablet, Rfl: 3 .  bimatoprost (LUMIGAN) 0.01 % SOLN, Place 1 drop into both eyes at bedtime. , Disp: , Rfl:  .  Blood Glucose Calibration (ACCU-CHEK SMARTVIEW CONTROL) LIQD, , Disp: , Rfl:  .  Blood Glucose Monitoring Suppl (ACCU-CHEK AVIVA PLUS) w/Device KIT, USE AS DIRECTED, Disp: 1 kit, Rfl: 0 .  Calcium Carb-Cholecalciferol (CALCIUM 600+D3) 600-800 MG-UNIT TABS, Take 1 tablet by mouth 2 (two) times daily., Disp: , Rfl:  .  cholecalciferol (VITAMIN D) 1000 units tablet, Take 1,000 Units by mouth daily., Disp: , Rfl:  .  cyanocobalamin 500 MCG tablet, Take 500 mcg by mouth  daily., Disp: , Rfl:  .  gabapentin (NEURONTIN) 300 MG capsule, Take 2 capsules (600 mg total) by mouth at bedtime., Disp: 180 capsule, Rfl: 3 .  glucose blood (ACCU-CHEK SMARTVIEW) test strip, 1 each by Other route daily. Use as instructed, Disp: 100 each, Rfl: 3 .  Lancets (ACCU-CHEK SOFT TOUCH) lancets, Use as instructed, Disp: 100 each, Rfl: 12 .  latanoprost (XALATAN) 0.005 % ophthalmic solution, Place 1 drop into both eyes at bedtime. , Disp: , Rfl:  .  letrozole (FEMARA) 2.5 MG tablet, Take 1 tablet (2.5 mg total) by mouth daily., Disp: 90 tablet, Rfl: 3 .  meclizine (ANTIVERT) 25 MG tablet, Take 0.5 tablets (12.5 mg total) by mouth 3 (three) times daily as needed for dizziness., Disp: 15 tablet, Rfl: 0 .  meloxicam (MOBIC) 15 MG tablet, TAKE 1 TABLET EVERY DAY, Disp: 90 tablet, Rfl: 0 .  metFORMIN (GLUCOPHAGE) 500 MG tablet, TAKE 1 TABLET BY MOUTH TWICE DAILY WITH MEALS, Disp: 60 tablet, Rfl: 11 .  metoprolol tartrate (LOPRESSOR) 25 MG tablet, Take 0.5 tablets (12.5 mg total) by mouth 2 (two) times daily., Disp: 90 tablet, Rfl: 3 .  Multiple Vitamin (MULTIVITAMIN WITH MINERALS) TABS tablet, Take 1 tablet by mouth daily., Disp: , Rfl:  .  omega-3 acid ethyl esters (LOVAZA) 1 g capsule, Take 3 g by mouth daily. , Disp: , Rfl:  .  pantoprazole (PROTONIX) 40 MG tablet, Take 1 tablet (40 mg total)  by mouth 2 (two) times daily., Disp: 180 tablet, Rfl: 3 .  pyridoxine (B-6) 100 MG tablet, Take 100 mg by mouth daily., Disp: , Rfl:  .  sertraline (ZOLOFT) 50 MG tablet, Take 1 tablet (50 mg total) by mouth daily., Disp: 30 tablet, Rfl: 3 .  vitamin C (ASCORBIC ACID) 500 MG tablet, Take 1,000 mg by mouth daily., Disp: , Rfl:  .  vitamin E 400 UNIT capsule, Take 400 Units by mouth daily., Disp: , Rfl:   Review of Systems  Constitutional: Negative for appetite change, chills, fatigue and fever.  HENT: Negative.   Eyes: Negative.   Respiratory: Negative for chest tightness and shortness of breath.    Cardiovascular: Negative for chest pain and palpitations.  Gastrointestinal: Negative for abdominal pain, nausea and vomiting.  Endocrine: Negative.   Allergic/Immunologic: Negative.   Neurological: Negative for dizziness and weakness.  Psychiatric/Behavioral: Negative.     Social History   Tobacco Use  . Smoking status: Never Smoker  . Smokeless tobacco: Never Used  Substance Use Topics  . Alcohol use: No      Objective:   There were no vitals taken for this visit. There were no vitals filed for this visit.There is no height or weight on file to calculate BMI.   Physical Exam Vitals reviewed.  Constitutional:      Appearance: She is well-developed. She is obese.  HENT:     Head: Normocephalic and atraumatic.     Right Ear: External ear normal.     Left Ear: External ear normal.     Nose: Nose normal.  Eyes:     General: No scleral icterus. Neck:     Thyroid: No thyromegaly.  Cardiovascular:     Rate and Rhythm: Normal rate and regular rhythm.     Heart sounds: Normal heart sounds.  Pulmonary:     Effort: Pulmonary effort is normal.     Breath sounds: Normal breath sounds.  Abdominal:     Palpations: Abdomen is soft.  Skin:    General: Skin is warm and dry.  Neurological:     Mental Status: She is alert and oriented to person, place, and time.  Psychiatric:        Behavior: Behavior normal.        Thought Content: Thought content normal.        Judgment: Judgment normal.      No results found for any visits on 10/14/19.     Assessment & Plan    1. Controlled type 2 diabetes mellitus without complication, without long-term current use of insulin (HCC) A1c today is 8.2.  She is encouraged to work on diet and exercise.  Consider trying Metformin again in this patient. - POCT HgB A1C  2. Benign essential HTN Controlled on metoprolol.  Will check on next visit why patient is not on an ACE or an ARB  3. Carcinoma of upper-outer quadrant of right breast  in female, estrogen receptor positive ([place])   4. Adjustment disorder with mixed anxiety and depressed mood In mourning after the death of her husband in 08-19-23.     Richard Cranford Mon, MD  [facility]

## 2019-10-14 ENCOUNTER — Other Ambulatory Visit: Payer: Self-pay

## 2019-10-14 ENCOUNTER — Ambulatory Visit (INDEPENDENT_AMBULATORY_CARE_PROVIDER_SITE_OTHER): Payer: Medicare HMO | Admitting: Family Medicine

## 2019-10-14 ENCOUNTER — Encounter: Payer: Self-pay | Admitting: Family Medicine

## 2019-10-14 VITALS — BP 121/81 | HR 74 | Temp 96.2°F | Wt 197.2 lb

## 2019-10-14 DIAGNOSIS — I10 Essential (primary) hypertension: Secondary | ICD-10-CM

## 2019-10-14 DIAGNOSIS — Z170 Estrogen receptor positive status [ER+]: Secondary | ICD-10-CM

## 2019-10-14 DIAGNOSIS — C50411 Malignant neoplasm of upper-outer quadrant of right female breast: Secondary | ICD-10-CM

## 2019-10-14 DIAGNOSIS — F4323 Adjustment disorder with mixed anxiety and depressed mood: Secondary | ICD-10-CM

## 2019-10-14 DIAGNOSIS — E119 Type 2 diabetes mellitus without complications: Secondary | ICD-10-CM | POA: Diagnosis not present

## 2019-10-14 LAB — POCT GLYCOSYLATED HEMOGLOBIN (HGB A1C)
Estimated Average Glucose: 189
Hemoglobin A1C: 8.2 % — AB (ref 4.0–5.6)

## 2019-10-14 NOTE — Patient Instructions (Signed)

## 2019-10-17 ENCOUNTER — Ambulatory Visit: Payer: Medicare HMO | Attending: Internal Medicine

## 2019-10-17 ENCOUNTER — Other Ambulatory Visit: Payer: Self-pay

## 2019-10-17 DIAGNOSIS — Z23 Encounter for immunization: Secondary | ICD-10-CM

## 2019-10-17 NOTE — Progress Notes (Signed)
   Covid-19 Vaccination Clinic  Name:  Leah Schaefer    MRN: [id number] DOB: [birth year]  10/17/2019  [name] was observed post Covid-19 immunization for 15 minutes without incident. She was provided with Vaccine Information Sheet and instruction to access the V-Safe system.   [name] was instructed to call 911 with any severe reactions post vaccine: Marland Kitchen Difficulty breathing  . Swelling of face and throat  . A fast heartbeat  . A bad rash all over body  . Dizziness and weakness   Immunizations Administered    Name Date Dose VIS Date Route   Pfizer COVID-19 Vaccine 10/17/2019 10:35 AM 0.3 mL 07/18/2019 Intramuscular   Manufacturer: [place]   Lot: [id number]   [place]: [id number]

## 2019-10-21 ENCOUNTER — Inpatient Hospital Stay: Payer: Medicare HMO

## 2019-10-21 ENCOUNTER — Inpatient Hospital Stay: Payer: Medicare HMO | Admitting: Internal Medicine

## 2019-10-22 ENCOUNTER — Ambulatory Visit: Payer: Self-pay | Admitting: Family Medicine

## 2019-10-23 NOTE — Telephone Encounter (Signed)
Opened in error

## 2019-11-11 ENCOUNTER — Ambulatory Visit: Payer: Medicare HMO | Attending: Internal Medicine

## 2019-11-11 DIAGNOSIS — Z23 Encounter for immunization: Secondary | ICD-10-CM

## 2019-11-11 NOTE — Progress Notes (Signed)
   Covid-19 Vaccination Clinic  Name:  Leah Schaefer    MRN: [id number] DOB: 08-11-1940  11/11/2019  Leah Schaefer was observed post Covid-19 immunization for 15 minutes without incident. She was provided with Vaccine Information Sheet and instruction to access the V-Safe system.   Leah Schaefer was instructed to call 911 with any severe reactions post vaccine: Marland Kitchen Difficulty breathing  . Swelling of face and throat  . A fast heartbeat  . A bad rash all over body  . Dizziness and weakness   Immunizations Administered    Name Date Dose VIS Date Route   Pfizer COVID-19 Vaccine 11/11/2019 11:38 AM 0.3 mL 07/18/2019 Intramuscular   Manufacturer: [place]   Lot: [id number]   [place]: [id number]

## 2019-12-03 ENCOUNTER — Encounter: Payer: Self-pay | Admitting: *Deleted

## 2019-12-03 ENCOUNTER — Telehealth: Payer: Self-pay | Admitting: Family Medicine

## 2019-12-03 NOTE — Chronic Care Management (AMB) (Signed)
  Chronic Care Management   Note  12/03/2019 Name: [name] MRN: [id number] DOB: 1940-10-15  Leah Schaefer is a 79 y.o. year old female who is a primary care patient of Jerrol Banana., MD. I reached out to [name] by phone today in response to a referral sent by Ms. Nelwyn [place] health plan.     Ms. Popoca was given information about Chronic Care Management services today including:  1. CCM service includes personalized support from designated clinical staff supervised by her physician, including individualized plan of care and coordination with other care providers 2. 24/7 contact phone numbers for assistance for urgent and routine care needs. 3. Service will only be billed when office clinical staff spend 20 minutes or more in a month to coordinate care. 4. Only one practitioner may furnish and bill the service in a calendar month. 5. The patient may stop CCM services at any time (effective at the end of the month) by phone call to the office staff. 6. The patient will be responsible for cost sharing (co-pay) of up to 20% of the service fee (after annual deductible is met).  Patient agreed to services and verbal consent obtained.   Follow up plan: Telephone appointment with care management team member scheduled for:12/22/2019  Noreene Larsson, [place], [place], [place] [address] Direct Dial: [phone] [email or web address] Website: [email or web address]

## 2019-12-22 ENCOUNTER — Telehealth: Payer: Medicare HMO

## 2019-12-22 NOTE — Chronic Care Management (AMB) (Deleted)
Chronic Care Management Pharmacy  Name: Leah Schaefer  MRN: [id number] DOB: [birth year]  Chief Complaint/ HPI  Leah Schaefer,  79 y.o. , female presents for their Initial CCM visit with the clinical pharmacist via telephone due to COVID-19 Pandemic.  PCP : Jerrol Banana., MD  Their chronic conditions include: ***  Office Visits: 3/9 DM, Rosanna Randy, BP 121/81 P 74 Wt 197 BMI 31.8, A1c 8.2%, rechallenge metformin, no ACEI/ARB 11/17 DM, [place], BP 130/64 P 60 Wt 200 BMI 32.3, A1c 7.7%  Consult Visit: 2/23 neoplasm, [place], malignant  Medications: Outpatient Encounter Medications as of 12/22/2019  Medication Sig Note  . ACCU-CHEK FASTCLIX LANCETS MISC CHECK SUGAR ONCE DAILY    . acetaminophen (TYLENOL) 500 MG tablet Take 500 mg by mouth every 6 (six) hours as needed for mild pain or headache.    . Alcohol Swabs (B-D SINGLE USE SWABS REGULAR) PADS CHECK SUGAR ONCE DAILY   . atorvastatin (LIPITOR) 40 MG tablet Take 1 tablet (40 mg total) by mouth every evening.   . bimatoprost (LUMIGAN) 0.01 % SOLN Place 1 drop into both eyes at bedtime.    . Blood Glucose Calibration (ACCU-CHEK SMARTVIEW CONTROL) LIQD  01/25/2016: Received from: External Pharmacy  . Blood Glucose Monitoring Suppl (ACCU-CHEK AVIVA PLUS) w/Device KIT USE AS DIRECTED   . Calcium Carb-Cholecalciferol (CALCIUM 600+D3) 600-800 MG-UNIT TABS Take 1 tablet by mouth 2 (two) times daily.   . cholecalciferol (VITAMIN D) 1000 units tablet Take 1,000 Units by mouth daily.   . cyanocobalamin 500 MCG tablet Take 500 mcg by mouth daily.   Marland Kitchen gabapentin (NEURONTIN) 300 MG capsule Take 2 capsules (600 mg total) by mouth at bedtime.   Marland Kitchen glucose blood (ACCU-CHEK SMARTVIEW) test strip 1 each by Other route daily. Use as instructed   . Lancets (ACCU-CHEK SOFT TOUCH) lancets Use as instructed   . latanoprost (XALATAN) 0.005 % ophthalmic solution Place 1 drop into both eyes at bedtime.    Marland Kitchen letrozole (FEMARA) 2.5 MG tablet Take 1  tablet (2.5 mg total) by mouth daily.   . meclizine (ANTIVERT) 25 MG tablet Take 0.5 tablets (12.5 mg total) by mouth 3 (three) times daily as needed for dizziness.   . meloxicam (MOBIC) 15 MG tablet TAKE 1 TABLET EVERY DAY 11/07/2018: Pt takes as needed only  . metFORMIN (GLUCOPHAGE) 500 MG tablet TAKE 1 TABLET BY MOUTH TWICE DAILY WITH MEALS   . metoprolol tartrate (LOPRESSOR) 25 MG tablet Take 0.5 tablets (12.5 mg total) by mouth 2 (two) times daily.   . Multiple Vitamin (MULTIVITAMIN WITH MINERALS) TABS tablet Take 1 tablet by mouth daily.   Marland Kitchen omega-3 acid ethyl esters (LOVAZA) 1 g capsule Take 3 g by mouth daily.    . pantoprazole (PROTONIX) 40 MG tablet Take 1 tablet (40 mg total) by mouth 2 (two) times daily.   Marland Kitchen pyridoxine (B-6) 100 MG tablet Take 100 mg by mouth daily.   . sertraline (ZOLOFT) 50 MG tablet Take 1 tablet (50 mg total) by mouth daily.   . vitamin C (ASCORBIC ACID) 500 MG tablet Take 1,000 mg by mouth daily.   . vitamin E 400 UNIT capsule Take 400 Units by mouth daily.    No facility-administered encounter medications on file as of 12/22/2019.     Current Diagnosis/Assessment:  Goals Addressed   None    Diabetes   Recent Relevant Labs: Lab Results  Component Value Date/Time   HGBA1C 8.2 (A) 10/14/2019 02:59 PM  HGBA1C 7.7 (A) 06/24/2019 01:54 PM   HGBA1C 7.1 (H) 11/07/2018 03:17 AM   HGBA1C 7.2 (H) 10/21/2018 10:39 AM   MICROALBUR 100 04/23/2017 09:31 AM   MICROALBUR Negative 03/30/2015 02:56 PM     Checking BG: {CHL HP Blood Glucose Monitoring Frequency:[phone]}  Recent FBG Readings: Recent pre-meal BG readings: *** Recent 2hr PP BG readings:  *** Recent HS BG readings: *** Patient has failed these meds in past: *** Patient is currently {CHL Controlled/Uncontrolled:[phone]} on the following medications: ***  Last diabetic Foot exam:  Lab Results  Component Value Date/Time   HMDIABEYEEXA No Retinopathy 01/09/2018 12:00 AM    Last diabetic  Eye exam:  Lab Results  Component Value Date/Time   HMDIABFOOTEX normal 03/22/2017 12:00 AM     We discussed: {CHL HP Upstream Pharmacy discussion:[id number]}  Plan  Continue {CHL HP Upstream Pharmacy Plans:[phone]}    Hypertension   BP today is:  {CHL HP UPSTREAM Pharmacist BP ranges:[phone]}  Office blood pressures are  BP Readings from Last 3 Encounters:  10/14/19 121/81  06/25/19 140/80  06/24/19 130/64    Patient has failed these meds in the past: ***  Patient checks BP at home {CHL HP BP Monitoring Frequency:[phone]}  Patient home BP readings are ranging: ***  We discussed   Re-challenge with ACE/ARB  Plan  Continue {CHL HP Upstream Pharmacy Plans:[phone]}     Hyperlipidemia   Lipid Panel     Component Value Date/Time   CHOL 103 11/07/2018 0317   CHOL 136 10/21/2018 1039   TRIG 94 11/07/2018 0317   HDL 32 (L) 11/07/2018 0317   HDL 34 (L) 10/21/2018 1039   CHOLHDL 3.2 11/07/2018 0317   VLDL 19 11/07/2018 0317   LDLCALC 52 11/07/2018 0317   LDLCALC 84 10/21/2018 1039   LABVLDL 18 10/21/2018 1039     The ASCVD Risk score (Goff DC Jr., et al., 2013) failed to calculate for the following reasons:   The valid total cholesterol range is 130 to 320 mg/dL   Patient has failed these meds in past: *** Patient is currently {CHL Controlled/Uncontrolled:[phone]} on the following medications: ***  We discussed:  {CHL HP Upstream Pharmacy discussion:[id number]}  Plan  Continue {CHL HP Upstream Pharmacy YBNLW:[id number]}

## 2019-12-23 ENCOUNTER — Telehealth: Payer: Self-pay | Admitting: Family Medicine

## 2019-12-23 NOTE — Chronic Care Management (AMB) (Signed)
  Chronic Care Management   Note  12/23/2019 Name: ADASHA DAYMON MRN: [id number] DOB: July 05, 1941  CHIOMA TAY is a 79 y.o. year old female who is a primary care patient of Jerrol Banana., MD and is actively engaged with the care management team. I reached out to [name] by phone today to assist with scheduling an initial visit with the Pharmacist  Follow up plan: Unsuccessful telephone outreach attempt made. A HIPPA compliant phone message was left for the patient providing contact information and requesting a return call.  The care management team will reach out to the patient again over the next 7 days.  If patient returns call to provider office, please advise to call [place] at [phone].  [place], [place] [address] Direct Dial: [phone] [email or web address] Website: [email or web address]

## 2019-12-24 ENCOUNTER — Telehealth: Payer: Self-pay | Admitting: Family Medicine

## 2019-12-24 NOTE — Telephone Encounter (Signed)
Left message for patient to schedule Annual Wellness Visit. Leah KitchenPlease schedule with Nurse Health [place], RN at [facility].  If patient returns call, please schedule AWV with [facility] any date after: 01/14/20

## 2019-12-29 NOTE — Chronic Care Management (AMB) (Signed)
  Care Management   Note  12/29/2019 Name: ALETTA BIXLER MRN: [id number] DOB: [birth year]  ILLEANA COOMBS is a 79 y.o. year old female who is a primary care patient of Jerrol Banana., MD and is actively engaged with the care management team. I reached out to [name] by phone today to assist with re-scheduling an initial visit with the Pharmacist  Follow up plan: Telephone appointment with care management team member scheduled for:02/16/2020  [place], [place] [facility]  [place], [place] [address] Direct Dial: [email or web address] Website: [email or web address]

## 2020-01-01 ENCOUNTER — Other Ambulatory Visit: Payer: Self-pay | Admitting: *Deleted

## 2020-01-01 DIAGNOSIS — E119 Type 2 diabetes mellitus without complications: Secondary | ICD-10-CM

## 2020-01-01 MED ORDER — ACCU-CHEK SOFTCLIX LANCETS MISC: 12 refills | Status: DC

## 2020-01-01 MED ORDER — ACCU-CHEK AVIVA PLUS VI STRP: ORAL_STRIP | 12 refills | Status: DC

## 2020-01-16 NOTE — Progress Notes (Signed)
[name] [name],acting as a scribe for Leah Durie, MD.,have documented all relevant documentation on the behalf of Leah Durie, MD,as directed by  Leah Durie, MD while in the presence of Leah Durie, MD.  Established patient visit   Patient: Leah Schaefer   DOB: 1941/06/09   79 y.o. Female  MRN: [id number] Visit Date: 01/21/2020  Today's healthcare provider: [name], MD   Chief Complaint  Patient presents with  . Diabetes Mellitus  . Hypertension   Subjective    HPI  Physically she is doing well but she has been mourning the loss of her husband.  He died last year. Diabetes Mellitus Type II, Follow-up  Lab Results  Component Value Date   HGBA1C 7.6 (A) 01/26/2020   HGBA1C 8.2 (A) 10/14/2019   HGBA1C 7.7 (A) 06/24/2019   Wt Readings from Last 3 Encounters:  01/21/20 178 lb 3.2 oz (80.8 kg)  10/14/19 197 lb 3.2 oz (89.4 kg)  06/25/19 193 lb 6.4 oz (87.7 kg)   Last seen for diabetes 3 months ago.  Management since then includes work on diet and exercise. Consider taking metformin again . She reports excellent compliance with treatment. She is not having side effects.   Home blood sugar records: fasting range: 178  Episodes of hypoglycemia? No    Current insulin regiment: none Most Recent Eye Exam: approximately 1 year ago Current exercise: walking Current diet habits: in general, an "unhealthy" diet   Hypertension, follow-up  BP Readings from Last 3 Encounters:  01/21/20 (!) 147/81  10/14/19 121/81  06/25/19 140/80   Wt Readings from Last 3 Encounters:  01/21/20 178 lb 3.2 oz (80.8 kg)  10/14/19 197 lb 3.2 oz (89.4 kg)  06/25/19 193 lb 6.4 oz (87.7 kg)     She was last seen for hypertension 3 months ago.  BP at that visit was 121/81. Management since that visit includes controlled, no changes.  She reports excellent compliance with treatment. She is not having side effects.  She is following a Low fat, Low  Sodium diet. She is exercising. She does not smoke.  Use of agents associated with hypertension: NSAIDS.   Outside blood pressures are not being checked regularly.  ---------------------------------------------------------------------------------------------------  Pertinent Labs: Lab Results  Component Value Date   CHOL 111 01/21/2020   HDL 37 (L) 01/21/2020   LDLCALC 59 01/21/2020   TRIG 70 01/21/2020   CHOLHDL 3.0 01/21/2020   Lab Results  Component Value Date   NA 141 01/21/2020   K 4.3 01/21/2020   CREATININE 0.69 01/21/2020   GFRNONAA 84 01/21/2020   GFRAA 97 01/21/2020   GLUCOSE 151 (H) 01/21/2020     ---------------------------------------------------------------------------------------------------      Medications: Outpatient Medications Prior to Visit  Medication Sig  . Accu-Chek Softclix Lancets lancets Use as instructed  . acetaminophen (TYLENOL) 500 MG tablet Take 500 mg by mouth every 6 (six) hours as needed for mild pain or headache.   . Alcohol Swabs (B-D SINGLE USE SWABS REGULAR) PADS CHECK SUGAR ONCE DAILY  . atorvastatin (LIPITOR) 40 MG tablet Take 1 tablet (40 mg total) by mouth every evening.  . bimatoprost (LUMIGAN) 0.01 % SOLN Place 1 drop into both eyes at bedtime.   . Blood Glucose Monitoring Suppl (ACCU-CHEK AVIVA PLUS) w/Device KIT USE AS DIRECTED  . Calcium Carb-Cholecalciferol (CALCIUM 600+D3) 600-800 MG-UNIT TABS Take 1 tablet by mouth 2 (two) times daily.  . cholecalciferol (VITAMIN D) 1000 units  tablet Take 1,000 Units by mouth daily.  . cyanocobalamin 500 MCG tablet Take 500 mcg by mouth daily.  Marland Kitchen gabapentin (NEURONTIN) 300 MG capsule Take 2 capsules (600 mg total) by mouth at bedtime.  Marland Kitchen glucose blood (ACCU-CHEK AVIVA PLUS) test strip Check Sugar Once Daily  . latanoprost (XALATAN) 0.005 % ophthalmic solution Place 1 drop into both eyes at bedtime.   Marland Kitchen letrozole (FEMARA) 2.5 MG tablet Take 1 tablet (2.5 mg total) by mouth daily.  .  meclizine (ANTIVERT) 25 MG tablet Take 0.5 tablets (12.5 mg total) by mouth 3 (three) times daily as needed for dizziness.  . meloxicam (MOBIC) 15 MG tablet TAKE 1 TABLET EVERY DAY  . metFORMIN (GLUCOPHAGE) 500 MG tablet TAKE 1 TABLET BY MOUTH TWICE DAILY WITH MEALS  . metoprolol tartrate (LOPRESSOR) 25 MG tablet Take 0.5 tablets (12.5 mg total) by mouth 2 (two) times daily.  . Multiple Vitamin (MULTIVITAMIN WITH MINERALS) TABS tablet Take 1 tablet by mouth daily.  Marland Kitchen omega-3 acid ethyl esters (LOVAZA) 1 g capsule Take 3 g by mouth daily.   . pantoprazole (PROTONIX) 40 MG tablet Take 1 tablet (40 mg total) by mouth 2 (two) times daily.  Marland Kitchen pyridoxine (B-6) 100 MG tablet Take 100 mg by mouth daily.  . sertraline (ZOLOFT) 50 MG tablet Take 1 tablet (50 mg total) by mouth daily.  . vitamin C (ASCORBIC ACID) 500 MG tablet Take 1,000 mg by mouth daily.  . vitamin E 400 UNIT capsule Take 400 Units by mouth daily.   No facility-administered medications prior to visit.    Review of Systems  Constitutional: Negative for appetite change, chills, fatigue and fever.  HENT: Negative.   Eyes: Negative.   Respiratory: Negative for chest tightness and shortness of breath.   Cardiovascular: Negative for chest pain and palpitations.  Gastrointestinal: Negative for abdominal pain, nausea and vomiting.  Endocrine: Negative.   Allergic/Immunologic: Negative.   Neurological: Negative for dizziness and weakness.  Hematological: Negative.   Psychiatric/Behavioral: Negative.        Objective    BP (!) 147/81 (BP Location: Right Arm, Patient Position: Sitting, Cuff Size: Large)   Pulse 75   Temp (!) 96.6 F (35.9 C) (Temporal)   Ht '5\' 6"'  (1.676 m)   Wt 178 lb 3.2 oz (80.8 kg)   BMI 28.76 kg/m     Physical Exam Vitals reviewed.  Constitutional:      Appearance: She is well-developed. She is obese.  HENT:     Head: Normocephalic and atraumatic.     Right Ear: External ear normal.     Left Ear:  External ear normal.     Nose: Nose normal.  Eyes:     General: No scleral icterus. Neck:     Thyroid: No thyromegaly.  Cardiovascular:     Rate and Rhythm: Normal rate and regular rhythm.     Heart sounds: Normal heart sounds.  Pulmonary:     Effort: Pulmonary effort is normal.     Breath sounds: Normal breath sounds.  Abdominal:     Palpations: Abdomen is soft.  Skin:    General: Skin is warm and dry.  Neurological:     Mental Status: She is alert and oriented to person, place, and time.  Psychiatric:        Behavior: Behavior normal.        Thought Content: Thought content normal.        Judgment: Judgment normal.   PHQ-9 is 7  Results for orders placed or performed in visit on 01/21/20  CBC with Differential/Platelet  Result Value Ref Range   WBC 5.6 3.4 - 10.8 x10E3/uL   RBC 4.63 3.77 - 5.28 x10E6/uL   Hemoglobin 14.4 11.1 - 15.9 g/dL   Hematocrit 41.5 34.0 - 46.6 %   MCV 90 79 - 97 fL   MCH 31.1 26.6 - 33.0 pg   MCHC 34.7 31 - 35 g/dL   RDW 12.2 11.7 - 15.4 %   Platelets 248 150 - 450 x10E3/uL   Neutrophils 60 Not Estab. %   Lymphs 29 Not Estab. %   Monocytes 6 Not Estab. %   Eos 4 Not Estab. %   Basos 1 Not Estab. %   Neutrophils Absolute 3.3 1 - 7 x10E3/uL   Lymphocytes Absolute 1.7 0 - 3 x10E3/uL   Monocytes Absolute 0.4 0 - 0 x10E3/uL   EOS (ABSOLUTE) 0.3 0.0 - 0.4 x10E3/uL   Basophils Absolute 0.1 0 - 0 x10E3/uL   Immature Granulocytes 0 Not Estab. %   Immature Grans (Abs) 0.0 0.0 - 0.1 x10E3/uL  Comprehensive metabolic panel  Result Value Ref Range   Glucose 151 (H) 65 - 99 mg/dL   BUN 10 8 - 27 mg/dL   Creatinine, Ser 0.69 0.57 - 1.00 mg/dL   GFR calc non Af Amer 84 >59 mL/min/1.73   GFR calc Af Amer 97 >59 mL/min/1.73   BUN/Creatinine Ratio 14 12 - 28   Sodium 141 134 - 144 mmol/L   Potassium 4.3 3.5 - 5.2 mmol/L   Chloride 102 96 - 106 mmol/L   CO2 24 20 - 29 mmol/L   Calcium 9.8 8.7 - 10.3 mg/dL   Total Protein 7.4 6.0 - 8.5 g/dL    Albumin 4.5 3.7 - 4.7 g/dL   Globulin, Total 2.9 1.5 - 4.5 g/dL   Albumin/Globulin Ratio 1.6 1.2 - 2.2   Bilirubin Total 0.2 0.0 - 1.2 mg/dL   Alkaline Phosphatase 79 48 - 121 IU/L   AST 18 0 - 40 IU/L   ALT 15 0 - 32 IU/L  Lipid panel  Result Value Ref Range   Cholesterol, Total 111 100 - 199 mg/dL   Triglycerides 70 0 - 149 mg/dL   HDL 37 (L) >39 mg/dL   VLDL Cholesterol Cal 15 5 - 40 mg/dL   LDL Chol Calc (NIH) 59 0 - 99 mg/dL   Chol/HDL Ratio 3.0 0.0 - 4.4 ratio  TSH  Result Value Ref Range   TSH 1.920 0.450 - 4.500 uIU/mL  POCT HgB A1C  Result Value Ref Range   Hemoglobin A1C 7.6 (A) 4.0 - 5.6 %   Estimated Average Glucose 171     Assessment & Plan     1. Controlled type 2 diabetes mellitus without complication, without long-term current use of insulin (HCC) A1c is 7.6.  Patient working on her habits.  Consider doubling Metformin.  Diabetic foot exam is good.  Microalbumin on next visit. - POCT HgB A1C - CBC with Differential/Platelet - Comprehensive metabolic panel - Lipid panel - TSH  2. Hypercholesteremia On atorvastati - CBC with Differential/Platelet - Comprehensive metabolic panel - Lipid panel - TSH  3. Essential (primary) hypertension  - CBC with Differential/Platelet - Comprehensive metabolic panel - Lipid panel - TSH  4. Adjustment disorder with anxious mood Continue sertraline.  We will see her back in 2 to 3 months. - CBC with Differential/Platelet - Comprehensive metabolic panel - Lipid panel - TSH  No follow-ups on file.      Bennett Scrape, CMA, have reviewed all documentation for this visit. The documentation on 01/26/20 for the exam, diagnosis, procedures, and orders are all accurate and complete.    Richard Cranford Mon, MD  [facility] [phone] (phone) [phone] (fax)  [place]

## 2020-01-21 ENCOUNTER — Encounter: Payer: Self-pay | Admitting: Family Medicine

## 2020-01-21 ENCOUNTER — Other Ambulatory Visit: Payer: Self-pay

## 2020-01-21 ENCOUNTER — Ambulatory Visit (INDEPENDENT_AMBULATORY_CARE_PROVIDER_SITE_OTHER): Payer: Medicare HMO | Admitting: Family Medicine

## 2020-01-21 VITALS — BP 147/81 | HR 75 | Temp 96.6°F | Ht 66.0 in | Wt 178.2 lb

## 2020-01-21 DIAGNOSIS — E119 Type 2 diabetes mellitus without complications: Secondary | ICD-10-CM

## 2020-01-21 DIAGNOSIS — I10 Essential (primary) hypertension: Secondary | ICD-10-CM

## 2020-01-21 DIAGNOSIS — F4322 Adjustment disorder with anxiety: Secondary | ICD-10-CM

## 2020-01-21 DIAGNOSIS — E7800 Pure hypercholesterolemia, unspecified: Secondary | ICD-10-CM

## 2020-01-22 LAB — COMPREHENSIVE METABOLIC PANEL
ALT: 15 IU/L (ref 0–32)
AST: 18 IU/L (ref 0–40)
Albumin/Globulin Ratio: 1.6 (ref 1.2–2.2)
Albumin: 4.5 g/dL (ref 3.7–4.7)
Alkaline Phosphatase: 79 IU/L (ref 48–121)
BUN/Creatinine Ratio: 14 (ref 12–28)
BUN: 10 mg/dL (ref 8–27)
Bilirubin Total: 0.2 mg/dL (ref 0.0–1.2)
CO2: 24 mmol/L (ref 20–29)
Calcium: 9.8 mg/dL (ref 8.7–10.3)
Chloride: 102 mmol/L (ref 96–106)
Creatinine, Ser: 0.69 mg/dL (ref 0.57–1.00)
GFR calc Af Amer: 97 mL/min/{1.73_m2} (ref >59)
GFR calc non Af Amer: 84 mL/min/{1.73_m2} (ref >59)
Globulin, Total: 2.9 g/dL (ref 1.5–4.5)
Glucose: 151 mg/dL — ABNORMAL HIGH (ref 65–99)
Potassium: 4.3 mmol/L (ref 3.5–5.2)
Sodium: 141 mmol/L (ref 134–144)
Total Protein: 7.4 g/dL (ref 6.0–8.5)

## 2020-01-22 LAB — CBC WITH DIFFERENTIAL/PLATELET
Basophils Absolute: 0.1 10*3/uL (ref 0.0–0.2)
Basos: 1 %
EOS (ABSOLUTE): 0.3 10*3/uL (ref 0.0–0.4)
Eos: 4 %
Hematocrit: 41.5 % (ref 34.0–46.6)
Hemoglobin: 14.4 g/dL (ref 11.1–15.9)
Immature Grans (Abs): 0 10*3/uL (ref 0.0–0.1)
Immature Granulocytes: 0 %
Lymphocytes Absolute: 1.7 10*3/uL (ref 0.7–3.1)
Lymphs: 29 %
MCH: 31.1 pg (ref 26.6–33.0)
MCHC: 34.7 g/dL (ref 31.5–35.7)
MCV: 90 fL (ref 79–97)
Monocytes Absolute: 0.4 10*3/uL (ref 0.1–0.9)
Monocytes: 6 %
Neutrophils Absolute: 3.3 10*3/uL (ref 1.4–7.0)
Neutrophils: 60 %
Platelets: 248 10*3/uL (ref 150–450)
RBC: 4.63 x10E6/uL (ref 3.77–5.28)
RDW: 12.2 % (ref 11.7–15.4)
WBC: 5.6 10*3/uL (ref 3.4–10.8)

## 2020-01-22 LAB — LIPID PANEL
Chol/HDL Ratio: 3 ratio (ref 0.0–4.4)
Cholesterol, Total: 111 mg/dL (ref 100–199)
HDL: 37 mg/dL — ABNORMAL LOW (ref >39)
LDL Chol Calc (NIH): 59 mg/dL (ref 0–99)
Triglycerides: 70 mg/dL (ref 0–149)
VLDL Cholesterol Cal: 15 mg/dL (ref 5–40)

## 2020-01-22 LAB — TSH: TSH: 1.92 u[IU]/mL (ref 0.450–4.500)

## 2020-01-26 LAB — POCT GLYCOSYLATED HEMOGLOBIN (HGB A1C)
Estimated Average Glucose: 171
Hemoglobin A1C: 7.6 % — AB (ref 4.0–5.6)

## 2020-02-16 ENCOUNTER — Telehealth: Payer: Self-pay | Admitting: Family Medicine

## 2020-02-16 ENCOUNTER — Telehealth: Payer: Medicare HMO

## 2020-02-16 NOTE — Chronic Care Management (AMB) (Signed)
  Chronic Care Management   Note  02/16/2020 Name: Leah Schaefer MRN: [id number] DOB: 1941/08/06  Leah Schaefer is a 79 y.o. year old female who is a primary care patient of Jerrol Banana., MD and is actively engaged with the care management team. I reached out to [name] by phone today to assist with re-scheduling an initial visit with the Pharmacist.  Follow up plan: Unsuccessful telephone outreach attempt made. A HIPPA compliant phone message was left for the patient providing contact information and requesting a return call. The care management team will reach out to the patient again over the next 7 days. If patient returns call to provider office, please advise to call [place] at [phone].  [place], [place] [address] Direct Dial: [phone] [email or web address]: [email or web address]

## 2020-02-18 ENCOUNTER — Other Ambulatory Visit: Payer: Self-pay | Admitting: General Surgery

## 2020-02-18 DIAGNOSIS — Z170 Estrogen receptor positive status [ER+]: Secondary | ICD-10-CM

## 2020-02-18 NOTE — Chronic Care Management (AMB) (Signed)
  Chronic Care Management   Note  02/18/2020 Name: Leah Schaefer MRN: [id number] DOB: Feb 07, 1941  [name] is a 79 y.o. year old female who is a primary care patient of Jerrol Banana., MD and is actively engaged with the care management team. I reached out to [name] by phone today to assist with re-scheduling an initial visit with the Pharmacist.  Follow up plan: Telephone appointment with care management team member scheduled for: 04/05/2020  [place], [place] [facility]  [place], [place] [address] Direct Dial: [email or web address] Website: [email or web address]

## 2020-03-06 ENCOUNTER — Telehealth: Payer: Self-pay | Admitting: Family Medicine

## 2020-03-16 ENCOUNTER — Other Ambulatory Visit: Payer: Self-pay | Admitting: Family Medicine

## 2020-03-16 DIAGNOSIS — M545 Low back pain, unspecified: Secondary | ICD-10-CM

## 2020-03-16 NOTE — Telephone Encounter (Signed)
Pt called back and is requesting this refill, wants to be contacted if there is anything that she needs to do    Best contact: [phone]

## 2020-03-16 NOTE — Telephone Encounter (Signed)
Requested Prescriptions  Pending Prescriptions Disp Refills   meloxicam (MOBIC) 15 MG tablet [Pharmacy Med Name: MELOXICAM 15 MG Tablet] 90 tablet 0    Sig: TAKE 1 TABLET EVERY DAY     Analgesics:  COX2 Inhibitors Passed - 03/16/2020  7:57 PM      Passed - HGB in normal range and within 360 days    Hemoglobin  Date Value Ref Range Status  01/21/2020 14.4 11.1 - 15.9 g/dL Final         Passed - Cr in normal range and within 360 days    Creatinine  Date Value Ref Range Status  09/04/2014 0.92 0.60 - 1.30 mg/dL Final   Creatinine, Ser  Date Value Ref Range Status  01/21/2020 0.69 0.57 - 1.00 mg/dL Final         Passed - Patient is not pregnant      Passed - Valid encounter within last 12 months    Recent Outpatient Visits          1 month ago Controlled type 2 diabetes mellitus without complication, without long-term current use of insulin ([place])   [facility] Jerrol Banana., MD   5 months ago Controlled type 2 diabetes mellitus without complication, without long-term current use of insulin [facility])   [facility] Jerrol Banana., MD   8 months ago Controlled type 2 diabetes mellitus without complication, without long-term current use of insulin [facility])   [facility] Jerrol Banana., MD   1 year ago Controlled type 2 diabetes mellitus without complication, without long-term current use of insulin [facility])   [facility] Jerrol Banana., MD   1 year ago TIA (transient ischemic attack)   [facility] Jerrol Banana., MD              sertraline (ZOLOFT) 50 MG tablet [Pharmacy Med Name: SERTRALINE HCL 50 MG Tablet] 90 tablet     Sig: TAKE 1 TABLET EVERY DAY     Psychiatry:  Antidepressants - SSRI Passed - 03/16/2020  7:57 PM      Passed - Valid encounter within last 6 months    Recent Outpatient Visits          1 month ago Controlled type 2 diabetes mellitus without  complication, without long-term current use of insulin [facility])   [facility] Jerrol Banana., MD   5 months ago Controlled type 2 diabetes mellitus without complication, without long-term current use of insulin [facility])   [facility] Jerrol Banana., MD   8 months ago Controlled type 2 diabetes mellitus without complication, without long-term current use of insulin [facility])   [facility] Jerrol Banana., MD   1 year ago Controlled type 2 diabetes mellitus without complication, without long-term current use of insulin [facility])   [facility] Jerrol Banana., MD   1 year ago TIA (transient ischemic attack)   [facility] Jerrol Banana., MD              gabapentin (NEURONTIN) 300 MG capsule [Pharmacy Med Name: GABAPENTIN 300 MG Capsule] 180 capsule 3    Sig: TAKE 2 CAPSULES AT BEDTIME     Neurology: Anticonvulsants - gabapentin Passed - 03/16/2020  7:57 PM      Passed - Valid encounter within last 12 months    Recent Outpatient Visits  1 month ago Controlled type 2 diabetes mellitus without complication, without long-term current use of insulin [facility])   [facility] Jerrol Banana., MD   5 months ago Controlled type 2 diabetes mellitus without complication, without long-term current use of insulin [facility])   [facility] Jerrol Banana., MD   8 months ago Controlled type 2 diabetes mellitus without complication, without long-term current use of insulin [facility])   [facility] Jerrol Banana., MD   1 year ago Controlled type 2 diabetes mellitus without complication, without long-term current use of insulin [facility])   [facility] Jerrol Banana., MD   1 year ago TIA (transient ischemic attack)   [facility] Jerrol Banana., MD

## 2020-03-18 ENCOUNTER — Ambulatory Visit
Admission: RE | Admit: 2020-03-18 | Discharge: 2020-03-18 | Disposition: A | Discharge status: Home / Self Care | Payer: Medicare HMO | Source: Ambulatory Visit | Attending: General Surgery | Admitting: General Surgery

## 2020-03-18 ENCOUNTER — Other Ambulatory Visit: Payer: Self-pay

## 2020-03-18 DIAGNOSIS — R928 Other abnormal and inconclusive findings on diagnostic imaging of breast: Secondary | ICD-10-CM | POA: Diagnosis not present

## 2020-03-18 DIAGNOSIS — C50411 Malignant neoplasm of upper-outer quadrant of right female breast: Secondary | ICD-10-CM | POA: Diagnosis not present

## 2020-03-18 DIAGNOSIS — Z170 Estrogen receptor positive status [ER+]: Secondary | ICD-10-CM | POA: Diagnosis not present

## 2020-03-25 DIAGNOSIS — Z853 Personal history of malignant neoplasm of breast: Secondary | ICD-10-CM | POA: Diagnosis not present

## 2020-04-05 ENCOUNTER — Ambulatory Visit: Payer: Medicare HMO | Admitting: Pharmacist

## 2020-04-05 ENCOUNTER — Other Ambulatory Visit: Payer: Self-pay

## 2020-04-05 DIAGNOSIS — E119 Type 2 diabetes mellitus without complications: Secondary | ICD-10-CM

## 2020-04-05 DIAGNOSIS — I10 Essential (primary) hypertension: Secondary | ICD-10-CM

## 2020-04-05 NOTE — Chronic Care Management (AMB) (Signed)
Chronic Care Management Pharmacy  Name: Leah Schaefer  MRN: [id number] DOB: Dec 05, 1940  Chief Complaint/ HPI  Leah Schaefer,  79 y.o. , female presents for their Initial CCM visit with the clinical pharmacist via telephone due to COVID-19 Pandemic.  PCP : Jerrol Banana., MD  Their chronic conditions include: HTN, HLD, DM, adjustment disorder, hx breast cancer  Office Visits: 6/16 DM, [place], BP 147/81 P 75 Wt 178 BMI 28.8, A1c 7.5%, double metformin?  Consult Visit: NA  Medications: Outpatient Encounter Medications as of 04/05/2020  Medication Sig  . Accu-Chek Softclix Lancets lancets Use as instructed  . acetaminophen (TYLENOL) 500 MG tablet Take 500 mg by mouth every 6 (six) hours as needed for mild pain or headache.   . Alcohol Swabs (B-D SINGLE USE SWABS REGULAR) PADS CHECK SUGAR ONCE DAILY  . atorvastatin (LIPITOR) 40 MG tablet Take 1 tablet (40 mg total) by mouth every evening.  . bimatoprost (LUMIGAN) 0.01 % SOLN Place 1 drop into both eyes at bedtime.   . Blood Glucose Monitoring Suppl (ACCU-CHEK AVIVA PLUS) w/Device KIT USE AS DIRECTED  . Calcium Carb-Cholecalciferol (CALCIUM 600+D3) 600-800 MG-UNIT TABS Take 1 tablet by mouth 2 (two) times daily.  . cholecalciferol (VITAMIN D) 1000 units tablet Take 1,000 Units by mouth daily.  . cyanocobalamin 500 MCG tablet Take 1,000 mcg by mouth daily.   Marland Kitchen gabapentin (NEURONTIN) 300 MG capsule TAKE 2 CAPSULES AT BEDTIME  . glucose blood (ACCU-CHEK AVIVA PLUS) test strip Check Sugar Once Daily  . latanoprost (XALATAN) 0.005 % ophthalmic solution Place 1 drop into both eyes at bedtime.   Marland Kitchen letrozole (FEMARA) 2.5 MG tablet Take 1 tablet (2.5 mg total) by mouth daily.  . meclizine (ANTIVERT) 25 MG tablet Take 0.5 tablets (12.5 mg total) by mouth 3 (three) times daily as needed for dizziness.  . meloxicam (MOBIC) 15 MG tablet TAKE 1 TABLET EVERY DAY (Patient taking differently: daily. 3 - 4 times monthly)  . metFORMIN  (GLUCOPHAGE) 500 MG tablet TAKE 1 TABLET BY MOUTH TWICE DAILY WITH MEALS  . metoprolol tartrate (LOPRESSOR) 25 MG tablet Take 0.5 tablets (12.5 mg total) by mouth 2 (two) times daily.  . Multiple Vitamin (MULTIVITAMIN WITH MINERALS) TABS tablet Take 1 tablet by mouth daily.  Marland Kitchen omega-3 acid ethyl esters (LOVAZA) 1 g capsule Take 1 g by mouth daily.   . pantoprazole (PROTONIX) 40 MG tablet Take 1 tablet (40 mg total) by mouth 2 (two) times daily.  Marland Kitchen pyridoxine (B-6) 100 MG tablet Take 100 mg by mouth daily.  . sertraline (ZOLOFT) 50 MG tablet TAKE 1 TABLET EVERY DAY  . vitamin C (ASCORBIC ACID) 500 MG tablet Take 1,000 mg by mouth daily.  . vitamin E 400 UNIT capsule Take 400 Units by mouth daily.  Marland Kitchen aspirin EC 81 MG tablet Take 81 mg by mouth daily. Swallow whole.   No facility-administered encounter medications on file as of 04/05/2020.      Financial Resource Strain: Low Risk   . Difficulty of Paying Living Expenses: Not very hard    Current Diagnosis/Assessment:  Goals Addressed            This Visit's Progress   . Chronic Care Management       CARE PLAN ENTRY (see longitudinal plan of care for additional care plan information)  Current Barriers:  . Chronic Disease Management support, education, and care coordination needs related to Hypertension, Hyperlipidemia, and Diabetes   Hypertension BP Readings from  Last 3 Encounters:  01/21/20 (!) 147/81  10/14/19 121/81  06/25/19 140/80   . Pharmacist Clinical Goal(s): o Over the next 90 days, patient will work with PharmD and providers to achieve BP goal <140/90 . Current regimen:  o Metoprolol 12.86m twice daily . Interventions: o Recommended lisinopril 2.566mdaily for kidney protection . Patient self care activities - Over the next 90 days, patient will: o Check BP daily, document, and provide at future appointments o Ensure daily salt intake < 2300 mg/day  Hyperlipidemia Lab Results  Component Value Date/Time    LDLCALC 59 01/21/2020 09:08 AM   . Pharmacist Clinical Goal(s): o Over the next 90 days, patient will work with PharmD and providers to maintain LDL goal < 70 . Current regimen:  o Lipitor 4027maily . Interventions: o None . Patient self care activities - Over the next 90 days, patient will: o Continue to take atorvastatin 13m80mily   Diabetes Lab Results  Component Value Date/Time   HGBA1C 7.6 (A) 01/26/2020 04:38 PM   HGBA1C 8.2 (A) 10/14/2019 02:59 PM   HGBA1C 7.1 (H) 11/07/2018 03:17 AM   HGBA1C 7.2 (H) 10/21/2018 10:39 AM   . Pharmacist Clinical Goal(s): o Over the next 90 days, patient will work with PharmD and providers to achieve A1c goal <7% . Current regimen:  o Metformin 500mg44mce daily with meals . Interventions: o Recommended metformin 1000mg 62me daily with meals . Patient self care activities - Over the next 90 days, patient will: o Check blood sugar once daily, document, and provide at future appointments o Contact provider with any episodes of hypoglycemia  Medication management . Pharmacist Clinical Goal(s): o Over the next 90 days, patient will work with PharmD and providers to maintain optimal medication adherence . Current pharmacy: [facility] . Interventions o Comprehensive medication review performed. o Continue current medication management strategy . Patient self care activities - Over the next 90 days, patient will: o Focus on medication adherence by taking larger metformin with a full meal o Take medications as prescribed o Report any questions or concerns to PharmD and/or provider(s)  Initial goal documentation        Diabetes   Recent Relevant Labs: Lab Results  Component Value Date/Time   HGBA1C 7.6 (A) 01/26/2020 04:38 PM   HGBA1C 8.2 (A) 10/14/2019 02:59 PM   HGBA1C 7.1 (H) 11/07/2018 03:17 AM   HGBA1C 7.2 (H) 10/21/2018 10:39 AM   MICROALBUR 100 04/23/2017 09:31 AM   MICROALBUR Negative 03/30/2015 02:56 PM     Checking BG:  Rarely  Patient is currently uncontrolled on the following medications: metformin  Last diabetic Foot exam:  Lab Results  Component Value Date/Time   HMDIABEYEEXA No Retinopathy 01/09/2018 12:00 AM    Last diabetic Eye exam:  Lab Results  Component Value Date/Time   HMDIABFOOTEX normal 03/22/2017 12:00 AM     We discussed: microalbumin ordered in March 2021 Metformin OK to double Taking full size ASA 325mg  44m  Ask PCP about labs, metformin and switch to 81mg as75mn Recommend increase metformin 1000mg bid25mperlipidemia   LDL goal < 70  Lipid Panel     Component Value Date/Time   CHOL 111 01/21/2020 0908   TRIG 70 01/21/2020 0908   HDL 37 (L) 01/21/2020 0908   LDLCALC 59 01/21/2020 0908    Hepatic Function Latest Ref Rng & Units 01/21/2020 11/22/2018 10/21/2018  Total Protein 6.0 - 8.5 g/dL 7.4 7.8 7.3  Albumin 3.7 - 4.7 g/dL  4.5 4.2 4.3  AST 0 - 40 IU/L '18 22 14  ' ALT 0 - 32 IU/L '15 20 17  ' Alk Phosphatase 48 - 121 IU/L 79 73 73  Total Bilirubin 0.0 - 1.2 mg/dL 0.2 0.4 0.3  Bilirubin, Direct 0.1 - 0.5 mg/dL - - -     The ASCVD Risk score (Roscoe., et al., 2013) failed to calculate for the following reasons:   The valid total cholesterol range is 130 to 320 mg/dL   Patient has failed these meds in past: NA Patient is currently controlled on the following medications:  . Atorvastatin 66m every evening  We discussed: Excellent control, meeting even the most strict guidelines that could be applied. Denies myalgias Making diet improvements  Plan  Continue current medications  Hypertension   BP goal is:  <140/90  Office blood pressures are  BP Readings from Last 3 Encounters:  01/21/20 (!) 147/81  10/14/19 121/81  06/25/19 140/80   Patient checks BP at home daily Patient home BP readings are ranging: systolic 1141-597 Patient has failed these meds in the past: NA Patient is currently uncontrolled on the following medications:  .Marland KitchenMetoprolol  12.558mtwice daily  We discussed: Not at goal, even at home. May benefit from ACEI introduced for renal protection under DM note  Plan  Continue current medications   Medication Management   Pt uses [facility] all medications Uses pill box? Yes Pt endorses 90% compliance  We discussed: Lexapro 2092maily and taking Zoloft prn  Antivert twice montly Mobic only as needed  Tylenol every day 500m72mice daily  Plan  Recommend d/c Lexapro Recommend increase Zoloft 100mg56mly Continue current medication management strategy  Follow up: 3 month phone visit  Markevius Trombetta HMilus HeightrmD, BCGP,[phone]

## 2020-04-06 NOTE — Patient Instructions (Addendum)
Visit Information  Goals Addressed            This Visit's Progress   . Chronic Care Management       CARE PLAN ENTRY (see longitudinal plan of care for additional care plan information)  Current Barriers:  . Chronic Disease Management support, education, and care coordination needs related to Hypertension, Hyperlipidemia, and Diabetes   Hypertension BP Readings from Last 3 Encounters:  01/21/20 (!) 147/81  10/14/19 121/81  06/25/19 140/80   . Pharmacist Clinical Goal(s): o Over the next 90 days, patient will work with PharmD and providers to achieve BP goal <140/90 . Current regimen:  o Metoprolol 12.5mg  twice daily . Interventions: o Recommended lisinopril 2.5mg  daily for kidney protection . Patient self care activities - Over the next 90 days, patient will: o Check BP daily, document, and provide at future appointments o Ensure daily salt intake < 2300 mg/day  Hyperlipidemia Lab Results  Component Value Date/Time   LDLCALC 59 01/21/2020 09:08 AM   . Pharmacist Clinical Goal(s): o Over the next 90 days, patient will work with PharmD and providers to maintain LDL goal < 70 . Current regimen:  o Lipitor 40mg  daily . Interventions: o None . Patient self care activities - Over the next 90 days, patient will: o Continue to take atorvastatin 40mg  daily   Diabetes Lab Results  Component Value Date/Time   HGBA1C 7.6 (A) 01/26/2020 04:38 PM   HGBA1C 8.2 (A) 10/14/2019 02:59 PM   HGBA1C 7.1 (H) 11/07/2018 03:17 AM   HGBA1C 7.2 (H) 10/21/2018 10:39 AM   . Pharmacist Clinical Goal(s): o Over the next 90 days, patient will work with PharmD and providers to achieve A1c goal <7% . Current regimen:  o Metformin 500mg  twice daily with meals . Interventions: o Recommended metformin 1000mg  twice daily with meals . Patient self care activities - Over the next 90 days, patient will: o Check blood sugar once daily, document, and provide at future appointments o Contact  provider with any episodes of hypoglycemia  Medication management . Pharmacist Clinical Goal(s): o Over the next 90 days, patient will work with PharmD and providers to maintain optimal medication adherence . Current pharmacy: [facility] . Interventions o Comprehensive medication review performed. o Continue current medication management strategy . Patient self care activities - Over the next 90 days, patient will: o Focus on medication adherence by taking larger metformin with a full meal o Take medications as prescribed o Report any questions or concerns to PharmD and/or provider(s)  Initial goal documentation        Ms. Rolison was given information about Chronic Care Management services today including:  1. CCM service includes personalized support from designated clinical staff supervised by her physician, including individualized plan of care and coordination with other care providers 2. 24/7 contact phone numbers for assistance for urgent and routine care needs. 3. Standard insurance, coinsurance, copays and deductibles apply for chronic care management only during months in which we provide at least 20 minutes of these services. Most insurances cover these services at 100%, however patients may be responsible for any copay, coinsurance and/or deductible if applicable. This service may help you avoid the need for more expensive face-to-face services. 4. Only one practitioner may furnish and bill the service in a calendar month. 5. The patient may stop CCM services at any time (effective at the end of the month) by phone call to the office staff.  Patient agreed to services and verbal consent obtained.  Print copy of patient instructions provided.  Telephone follow up appointment with pharmacy team member scheduled for: 3 months  Milus Height, PharmD, [place], [place] [phone]    Managing Your Hypertension Hypertension is commonly  called high blood pressure. This is when the force of your blood pressing against the walls of your arteries is too strong. Arteries are blood vessels that carry blood from your heart throughout your body. Hypertension forces the heart to work harder to pump blood, and may cause the arteries to become narrow or stiff. Having untreated or uncontrolled hypertension can cause heart attack, stroke, kidney disease, and other problems. What are blood pressure readings? A blood pressure reading consists of a higher number over a lower number. Ideally, your blood pressure should be below 120/80. The first ("top") number is called the systolic pressure. It is a measure of the pressure in your arteries as your heart beats. The second ("bottom") number is called the diastolic pressure. It is a measure of the pressure in your arteries as the heart relaxes. What does my blood pressure reading mean? Blood pressure is classified into four stages. Based on your blood pressure reading, your health care provider may use the following stages to determine what type of treatment you need, if any. Systolic pressure and diastolic pressure are measured in a unit called mm Hg. Normal  Systolic pressure: below 366.  Diastolic pressure: below 80. Elevated  Systolic pressure: 440-347.  Diastolic pressure: below 80. Hypertension stage 1  Systolic pressure: 425-956.  Diastolic pressure: 38-75. Hypertension stage 2  Systolic pressure: 643 or above.  Diastolic pressure: 90 or above. What health risks are associated with hypertension? Managing your hypertension is an important responsibility. Uncontrolled hypertension can lead to:  A heart attack.  A stroke.  A weakened blood vessel (aneurysm).  Heart failure.  Kidney damage.  Eye damage.  Metabolic syndrome.  Memory and concentration problems. What changes can I make to manage my hypertension? Hypertension can be managed by making lifestyle changes and  possibly by taking medicines. Your health care provider will help you make a plan to bring your blood pressure within a normal range. Eating and drinking   Eat a diet that is high in fiber and potassium, and low in salt (sodium), added sugar, and fat. An example eating plan is called the DASH (Dietary Approaches to Stop Hypertension) diet. To eat this way: ? Eat plenty of fresh fruits and vegetables. Try to fill half of your plate at each meal with fruits and vegetables. ? Eat whole grains, such as whole wheat pasta, brown rice, or whole grain bread. Fill about one quarter of your plate with whole grains. ? Eat low-fat diary products. ? Avoid fatty cuts of meat, processed or cured meats, and poultry with skin. Fill about one quarter of your plate with lean proteins such as fish, chicken without skin, beans, eggs, and tofu. ? Avoid premade and processed foods. These tend to be higher in sodium, added sugar, and fat.  Reduce your daily sodium intake. Most people with hypertension should eat less than 1,500 mg of sodium a day.  Limit alcohol intake to no more than 1 drink a day for nonpregnant women and 2 drinks a day for men. One drink equals 12 oz of beer, 5 oz of wine, or 1 oz of hard liquor. Lifestyle  Work with your health care provider to maintain a healthy body weight, or to lose weight. Ask what an ideal weight  is for you.  Get at least 30 minutes of exercise that causes your heart to beat faster (aerobic exercise) most days of the week. Activities may include walking, swimming, or biking.  Include exercise to strengthen your muscles (resistance exercise), such as weight lifting, as part of your weekly exercise routine. Try to do these types of exercises for 30 minutes at least 3 days a week.  Do not use any products that contain nicotine or tobacco, such as cigarettes and e-cigarettes. If you need help quitting, ask your health care provider.  Control any long-term (chronic) conditions  you have, such as high cholesterol or diabetes. Monitoring  Monitor your blood pressure at home as told by your health care provider. Your personal target blood pressure may vary depending on your medical conditions, your age, and other factors.  Have your blood pressure checked regularly, as often as told by your health care provider. Working with your health care provider  Review all the medicines you take with your health care provider because there may be side effects or interactions.  Talk with your health care provider about your diet, exercise habits, and other lifestyle factors that may be contributing to hypertension.  Visit your health care provider regularly. Your health care provider can help you create and adjust your plan for managing hypertension. Will I need medicine to control my blood pressure? Your health care provider may prescribe medicine if lifestyle changes are not enough to get your blood pressure under control, and if:  Your systolic blood pressure is 130 or higher.  Your diastolic blood pressure is 80 or higher. Take medicines only as told by your health care provider. Follow the directions carefully. Blood pressure medicines must be taken as prescribed. The medicine does not work as well when you skip doses. Skipping doses also puts you at risk for problems. Contact a health care provider if:  You think you are having a reaction to medicines you have taken.  You have repeated (recurrent) headaches.  You feel dizzy.  You have swelling in your ankles.  You have trouble with your vision. Get help right away if:  You develop a severe headache or confusion.  You have unusual weakness or numbness, or you feel faint.  You have severe pain in your chest or abdomen.  You vomit repeatedly.  You have trouble breathing. Summary  Hypertension is when the force of blood pumping through your arteries is too strong. If this condition is not controlled, it may  put you at risk for serious complications.  Your personal target blood pressure may vary depending on your medical conditions, your age, and other factors. For most people, a normal blood pressure is less than 120/80.  Hypertension is managed by lifestyle changes, medicines, or both. Lifestyle changes include weight loss, eating a healthy, low-sodium diet, exercising more, and limiting alcohol. This information is not intended to replace advice given to you by your health care provider. Make sure you discuss any questions you have with your health care provider. Document Revised: 11/15/2018 Document Reviewed: 06/21/2016 Elsevier Patient Education  [place].

## 2020-04-07 ENCOUNTER — Telehealth: Payer: Self-pay

## 2020-04-07 DIAGNOSIS — I10 Essential (primary) hypertension: Secondary | ICD-10-CM

## 2020-04-07 DIAGNOSIS — E119 Type 2 diabetes mellitus without complications: Secondary | ICD-10-CM

## 2020-04-07 MED ORDER — METFORMIN HCL 1000 MG PO TABS: 1000.0000 mg | ORAL_TABLET | Freq: Two times a day (BID) | ORAL | 2 refills | Status: DC

## 2020-04-07 MED ORDER — LOSARTAN POTASSIUM 25 MG PO TABS: 25.0000 mg | ORAL_TABLET | Freq: Every day | ORAL | 2 refills | Status: DC

## 2020-04-07 NOTE — Telephone Encounter (Signed)
-----   Message from Jerrol Banana., MD sent at 04/07/2020 12:43 PM EDT ----- Faythe Ghee to increase metformin to 1000 mg twice daily. Add losartan 25 mg daily.  Thank you

## 2020-04-07 NOTE — Telephone Encounter (Signed)
Called to advise patient that Dr. Rosanna Randy was increasing her Metformin to 1000 MG BID and Losartan 25 MG Daily. Medications have been sent to [facility].

## 2020-04-26 ENCOUNTER — Telehealth: Payer: Self-pay

## 2020-04-28 NOTE — Progress Notes (Signed)
I,Georgio Hattabaugh,acting as a scribe for [name], MD.,have documented all relevant documentation on the behalf of [name], MD,as directed by  [name], MD while in the presence of [name], MD.  Annual Wellness Visit     Patient: Leah Schaefer, Female    DOB: 06-22-1941, 79 y.o.   MRN: [id number] Visit Date: 05/03/2020  Today's Provider: [name], MD   Chief Complaint  Patient presents with   Medicare Wellness   Subjective    Leah Schaefer is a 79 y.o. female who presents today for her Annual Wellness Visit. She reports consuming a low sodium diet. The patient does not participate in regular exercise at present. She generally feels fairly well. She reports sleeping fairly well. She does not have additional problems to discuss today.   HPI       Medications: Outpatient Medications Prior to Visit  Medication Sig   Accu-Chek Softclix Lancets lancets Use as instructed   acetaminophen (TYLENOL) 500 MG tablet Take 500 mg by mouth every 6 (six) hours as needed for mild pain or headache.    Alcohol Swabs (B-D SINGLE USE SWABS REGULAR) PADS CHECK SUGAR ONCE DAILY   aspirin EC 81 MG tablet Take 81 mg by mouth daily. Swallow whole.   atorvastatin (LIPITOR) 40 MG tablet Take 1 tablet (40 mg total) by mouth every evening.   bimatoprost (LUMIGAN) 0.01 % SOLN Place 1 drop into both eyes at bedtime.    Blood Glucose Monitoring Suppl (ACCU-CHEK AVIVA PLUS) w/Device KIT USE AS DIRECTED   Calcium Carb-Cholecalciferol (CALCIUM 600+D3) 600-800 MG-UNIT TABS Take 1 tablet by mouth 2 (two) times daily.   cholecalciferol (VITAMIN D) 1000 units tablet Take 1,000 Units by mouth daily.   cyanocobalamin 500 MCG tablet Take 1,000 mcg by mouth daily.    gabapentin (NEURONTIN) 300 MG capsule TAKE 2 CAPSULES AT BEDTIME   glucose blood (ACCU-CHEK AVIVA PLUS) test strip Check Sugar Once Daily   latanoprost (XALATAN) 0.005 % ophthalmic  solution Place 1 drop into both eyes at bedtime.    letrozole (FEMARA) 2.5 MG tablet Take 1 tablet (2.5 mg total) by mouth daily.   meclizine (ANTIVERT) 25 MG tablet Take 0.5 tablets (12.5 mg total) by mouth 3 (three) times daily as needed for dizziness.   meloxicam (MOBIC) 15 MG tablet TAKE 1 TABLET EVERY DAY (Patient taking differently: daily. 3 - 4 times monthly)   metFORMIN (GLUCOPHAGE) 1000 MG tablet Take 1 tablet (1,000 mg total) by mouth 2 (two) times daily with a meal.   metoprolol tartrate (LOPRESSOR) 25 MG tablet Take 0.5 tablets (12.5 mg total) by mouth 2 (two) times daily.   Multiple Vitamin (MULTIVITAMIN WITH MINERALS) TABS tablet Take 1 tablet by mouth daily.   omega-3 acid ethyl esters (LOVAZA) 1 g capsule Take 1 g by mouth daily.    pantoprazole (PROTONIX) 40 MG tablet Take 1 tablet (40 mg total) by mouth 2 (two) times daily.   pyridoxine (B-6) 100 MG tablet Take 100 mg by mouth daily.   sertraline (ZOLOFT) 50 MG tablet TAKE 1 TABLET EVERY DAY   vitamin C (ASCORBIC ACID) 500 MG tablet Take 1,000 mg by mouth daily.   vitamin E 400 UNIT capsule Take 400 Units by mouth daily.   [DISCONTINUED] losartan (COZAAR) 25 MG tablet Take 1 tablet (25 mg total) by mouth daily.   No facility-administered medications prior to visit.    No Known Allergies  Patient Care  Team: Jerrol Banana., MD as PCP - General (Family Medicine) Bary Castilla, Forest Gleason, MD (General Surgery) Cammie Sickle, MD as Consulting Physician (Internal Medicine) Lorelee Cover., MD as Consulting Physician (Ophthalmology) Noreene Filbert, MD as Referring Physician (Radiation Oncology) Verdia Kuba, [facility] (Pharmacist)  Review of Systems  All other systems reviewed and are negative.   Last hemoglobin A1c Lab Results  Component Value Date   HGBA1C 7.6 (A) 01/26/2020      Objective    Vitals: BP (!) 153/79 (BP Location: Left Arm, Patient Position: Sitting, Cuff Size: Large)    Pulse 75     Temp 97.9 F (36.6 C) (Oral)    Resp 18    Ht 5' 6" (1.676 m)    Wt 197 lb (89.4 kg)    SpO2 99%    BMI 31.80 kg/m    Physical Exam  General Appearance:    Obese female. Alert, cooperative, in no acute distress, appears stated age   Head:    Normocephalic, without obvious abnormality, atraumatic  Eyes:    PERRL, conjunctiva/corneas clear, EOM's intact, fundi    benign, both eyes  Ears:    Normal TM's and external ear canals, both ears  Nose:   Nares normal, septum midline, mucosa normal, no drainage    or sinus tenderness  Throat:   Lips, mucosa, and tongue normal; teeth and gums normal  Neck:   Supple, symmetrical, trachea midline, no adenopathy;    thyroid:  no enlargement/tenderness/nodules; no carotid   bruit or JVD  Back:     Symmetric, no curvature, ROM normal, no CVA tenderness  Lungs:     Clear to auscultation bilaterally, respirations unlabored  Chest Wall:    No tenderness or deformity   Heart:    Normal heart rate. Normal rhythm. No murmurs, rubs, or gallops.   Breast Exam:    She has scarring of broke breast left versus right of with previous breast surgery.  There is a lot of scar tissue palpated in left versus right breast.  No axillary adenopathy, deferred  Abdomen:     Soft, non-tender, bowel sounds active all four quadrants,    no masses, no organomegaly  Pelvic:    deferred  Extremities:   All extremities are intact. No cyanosis or edema  Pulses:   2+ and symmetric all extremities  Skin:   Skin color, texture, turgor normal, no rashes or lesions  Lymph nodes:   Cervical, supraclavicular, and axillary nodes normal  Neurologic:   CNII-XII intact, normal strength, sensation and reflexes    throughout     Most recent functional status assessment: In your present state of health, do you have any difficulty performing the following activities: 05/03/2020  Hearing? N  Vision? N  Difficulty concentrating or making decisions? N  Walking or climbing stairs? Y    Dressing or bathing? N  Doing errands, shopping? N  Some recent data might be hidden   Most recent fall risk assessment: Fall Risk  05/03/2020  Falls in the past year? 1  Number falls in past yr: 1  Injury with Fall? 0  Follow up Falls evaluation completed    Most recent depression screenings: PHQ 2/9 Scores 05/03/2020 01/14/2019  PHQ - 2 Score 0 2  PHQ- 9 Score 1 3   Most recent cognitive screening: 6CIT Screen 12/06/2016  What Year? 0 points  What month? 0 points  What time? 0 points  Count back from 20 0 points  Months in reverse 4 points  Repeat phrase 4 points  Total Score 8   Most recent Audit-C alcohol use screening Alcohol Use Disorder Test (AUDIT) 05/03/2020  1. How often do you have a drink containing alcohol? 0  2. How many drinks containing alcohol do you have on a typical day when you are drinking? 0  3. How often do you have six or more drinks on one occasion? 0  AUDIT-C Score 0  Alcohol Brief Interventions/Follow-up AUDIT Score <7 follow-up not indicated   A score of 3 or more in women, and 4 or more in men indicates increased risk for alcohol abuse, EXCEPT if all of the points are from question 1   No results found for any visits on 05/03/20.  Assessment & Plan     Annual wellness visit done today including the all of the following: Reviewed patient's Family Medical History Reviewed and updated list of patient's medical providers Assessment of cognitive impairment was done Assessed patient's functional ability Established a written schedule for health screening [place] Completed and Reviewed  Exercise Activities and Dietary recommendations Goals     Chronic Care Management     CARE PLAN ENTRY (see longitudinal plan of care for additional care plan information)  Current Barriers:   Chronic Disease Management support, education, and care coordination needs related to Hypertension, Hyperlipidemia, and Diabetes    Hypertension BP Readings from Last 3 Encounters:  01/21/20 (!) 147/81  10/14/19 121/81  06/25/19 140/80    Pharmacist Clinical Goal(s): o Over the next 90 days, patient will work with PharmD and providers to achieve BP goal <140/90  Current regimen:  o Metoprolol 12.76m twice daily  Interventions: o Recommended lisinopril 2.544mdaily for kidney protection  Patient self care activities - Over the next 90 days, patient will: o Check BP daily, document, and provide at future appointments o Ensure daily salt intake < 2300 mg/day  Hyperlipidemia Lab Results  Component Value Date/Time   LDLCALC 59 01/21/2020 09:08 AM    Pharmacist Clinical Goal(s): o Over the next 90 days, patient will work with PharmD and providers to maintain LDL goal < 70  Current regimen:  o Lipitor 4075maily  Interventions: o None  Patient self care activities - Over the next 90 days, patient will: o Continue to take atorvastatin 44m58mily   Diabetes Lab Results  Component Value Date/Time   HGBA1C 7.6 (A) 01/26/2020 04:38 PM   HGBA1C 8.2 (A) 10/14/2019 02:59 PM   HGBA1C 7.1 (H) 11/07/2018 03:17 AM   HGBA1C 7.2 (H) 10/21/2018 10:39 AM    Pharmacist Clinical Goal(s): o Over the next 90 days, patient will work with PharmD and providers to achieve A1c goal <7%  Current regimen:  o Metformin 500mg33mce daily with meals  Interventions: o Recommended metformin 1000mg 64me daily with meals  Patient self care activities - Over the next 90 days, patient will: o Check blood sugar once daily, document, and provide at future appointments o Contact provider with any episodes of hypoglycemia  Medication management  Pharmacist Clinical Goal(s): o Over the next 90 days, patient will work with PharmD and providers to maintain optimal medication adherence  Current pharmacy: [facility]  Interventions o Comprehensive medication review performed. o Continue current medication management  strategy  Patient self care activities - Over the next 90 days, patient will: o Focus on medication adherence by taking larger metformin with a full meal o Take medications as prescribed o Report any questions  or concerns to PharmD and/or provider(s)  Initial goal documentation      DIET - INCREASE LEAN PROTEINS     Recommend to continue current diet regimen of eating chicken that is broil or grilled only and avoiding other fatty meats.      Exercise 3x per week (30 min per time)     Recommend to exercise/walk for 3 days a week for at least 30 minutes at a time.       Increase lean proteins     Recommend decreasing fatty meats and increasing lean meats in diet.       Immunization History  Administered Date(s) Administered   Fluad Quad(high Dose 65+) 06/24/2019, 05/03/2020   Influenza, High Dose Seasonal PF 05/13/2014, 05/29/2015, 04/05/2016, 04/19/2017, 04/11/2018   Influenza,inj,Quad PF,6+ Mos 05/23/2013   PFIZER SARS-COV-2 Vaccination 10/17/2019, 11/11/2019   Pneumococcal Conjugate-13 05/13/2014   Pneumococcal Polysaccharide-23 06/07/2012   Td 04/19/2007, 05/05/2011   Tdap 05/05/2011   Zoster 04/27/2008    Health Maintenance  Topic Date Due   Hepatitis C Screening  Never done   FOOT EXAM  03/22/2018   OPHTHALMOLOGY EXAM  01/10/2019   HEMOGLOBIN A1C  07/27/2020   TETANUS/TDAP  05/04/2021   INFLUENZA VACCINE  Completed   DEXA SCAN  Completed   COVID-19 Vaccine  Completed   PNA vac Low Risk Adult  Completed     Discussed health benefits of physical activity, and encouraged her to engage in regular exercise appropriate for her age and condition.   1. Encounter for Medicare annual wellness exam   2. Annual physical exam   3. Need for influenza vaccination  - Flu Vaccine QUAD High Dose(Fluad)  4. Essential (primary) hypertension Follow-up in 2 months for blood pressure and A1c - losartan (COZAAR) 50 MG tablet; Take 1 tablet (50 mg total)  by mouth daily.  Dispense: 90 tablet; Refill: 2  5. Controlled type 2 diabetes mellitus without complication, without long-term current use of insulin (HCC)   6. Carcinoma of upper-outer quadrant of right breast in female, estrogen receptor positive ([place]) Followed by surgery, Dr. Bary Castilla   Return in about 2 months (around 07/03/2020).     I, [name], MD, have reviewed all documentation for this visit. The documentation on 05/03/20 for the exam, diagnosis, procedures, and orders are all accurate and complete.    Richard Cranford Mon, MD  [facility] [place] [phone] (phone) [phone] (fax)  [place]

## 2020-04-30 NOTE — Progress Notes (Signed)
Left 3 messages for patient over 3 different day/times.

## 2020-05-03 ENCOUNTER — Other Ambulatory Visit: Payer: Self-pay

## 2020-05-03 ENCOUNTER — Encounter: Payer: Self-pay | Admitting: Family Medicine

## 2020-05-03 ENCOUNTER — Ambulatory Visit (INDEPENDENT_AMBULATORY_CARE_PROVIDER_SITE_OTHER): Payer: Medicare HMO | Admitting: Family Medicine

## 2020-05-03 VITALS — BP 153/79 | HR 75 | Temp 97.9°F | Resp 18 | Ht 66.0 in | Wt 197.0 lb

## 2020-05-03 DIAGNOSIS — I10 Essential (primary) hypertension: Secondary | ICD-10-CM | POA: Diagnosis not present

## 2020-05-03 DIAGNOSIS — E119 Type 2 diabetes mellitus without complications: Secondary | ICD-10-CM

## 2020-05-03 DIAGNOSIS — C50411 Malignant neoplasm of upper-outer quadrant of right female breast: Secondary | ICD-10-CM

## 2020-05-03 DIAGNOSIS — Z0000 Encounter for general adult medical examination without abnormal findings: Secondary | ICD-10-CM

## 2020-05-03 DIAGNOSIS — Z170 Estrogen receptor positive status [ER+]: Secondary | ICD-10-CM | POA: Diagnosis not present

## 2020-05-03 DIAGNOSIS — Z23 Encounter for immunization: Secondary | ICD-10-CM

## 2020-05-03 MED ORDER — LOSARTAN POTASSIUM 50 MG PO TABS: 50.0000 mg | ORAL_TABLET | Freq: Every day | ORAL | 2 refills | Status: DC

## 2020-05-03 NOTE — Patient Instructions (Signed)
Increase losartan from 25 mg to 50 mg daily.

## 2020-05-12 ENCOUNTER — Telehealth: Payer: Self-pay

## 2020-05-12 NOTE — Progress Notes (Signed)
..   Reviewed chart prior to disease state call. Spoke with patient regarding BP  Recent Office Vitals: BP Readings from Last 3 Encounters:  05/03/20 (!) 153/79  01/21/20 (!) 147/81  10/14/19 121/81   Pulse Readings from Last 3 Encounters:  05/03/20 75  01/21/20 75  10/14/19 74    Wt Readings from Last 3 Encounters:  05/03/20 197 lb (89.4 kg)  01/21/20 178 lb 3.2 oz (80.8 kg)  10/14/19 197 lb 3.2 oz (89.4 kg)     Kidney Function Lab Results  Component Value Date/Time   CREATININE 0.69 01/21/2020 09:08 AM   CREATININE 0.87 11/22/2018 01:00 PM   CREATININE 0.92 09/04/2014 09:16 AM   CREATININE 0.89 02/18/2014 10:48 AM   GFRNONAA 84 01/21/2020 09:08 AM   GFRNONAA >60 09/04/2014 09:16 AM   GFRNONAA >60 02/18/2014 10:48 AM   GFRAA 97 01/21/2020 09:08 AM   GFRAA >60 09/04/2014 09:16 AM   GFRAA >60 02/18/2014 10:48 AM    BMP Latest Ref Rng & Units 01/21/2020 11/22/2018 11/07/2018  Glucose 65 - 99 mg/dL 151(H) 160(H) -  BUN 8 - 27 mg/dL 10 9 -  Creatinine 0.57 - 1.00 mg/dL 0.69 0.87 0.79  BUN/Creat Ratio 12 - 28 14 - -  Sodium 134 - 144 mmol/L 141 137 -  Potassium 3.5 - 5.2 mmol/L 4.3 3.9 -  Chloride 96 - 106 mmol/L 102 103 -  CO2 20 - 29 mmol/L 24 28 -  Calcium 8.7 - 10.3 mg/dL 9.8 9.4 -    . Current antihypertensive regimen:  o Losartan, metoprolol . How often are you checking your Blood Pressure? infrequently . Current home BP readings: none . What recent interventions/DTPs have been made by any provider to improve Blood Pressure control since last CPP Visit: increased Losartan to 50mg  daily (05/03/20 [place]/pcp) . Any recent hospitalizations or ED visits since last visit with CPP? No . What diet changes have been made to improve Blood Pressure Control?  o Decrease salt . What exercise is being done to improve your Blood Pressure Control?  o none  Adherence Review: Is the patient currently on ACE/ARB medication? Yes Does the patient have >5 day gap between last  estimated fill dates? No   From conversation patient mentioned she was originally cutting her Losartan 25 mg and then taking both Losartan 25 mg and 50 mg. Reviewed medication list with patient. Patient voiced understanding.    [place], [place]

## 2020-05-12 NOTE — Telephone Encounter (Signed)
Copied from [place] [phone]. Topic: General - Inquiry >> May 12, 2020 12:58 PM Alease Frame wrote: Reason for CRM: pt is returning call from office . Pt states no message was left . No message in chart. Please advise

## 2020-05-14 ENCOUNTER — Other Ambulatory Visit: Payer: Self-pay | Admitting: Family Medicine

## 2020-05-14 DIAGNOSIS — M545 Low back pain, unspecified: Secondary | ICD-10-CM

## 2020-05-14 NOTE — Telephone Encounter (Signed)
MAil Order

## 2020-05-24 ENCOUNTER — Telehealth: Payer: Self-pay

## 2020-05-24 NOTE — Telephone Encounter (Signed)
Copied from [phone]. Topic: General - Inquiry >> May 24, 2020  1:43 PM Leah Schaefer wrote: Reason for CRM: Pt is requesting a medication for possible anxiety . She states she isnt able relax .please advise

## 2020-05-25 NOTE — Telephone Encounter (Signed)
Pt called from the pharmacy this morning about the request for a medication for her nerves / Pt was advised to wait until contacted and nothing was sent at this time/ please advise

## 2020-05-25 NOTE — Telephone Encounter (Signed)
Per Dr. Rosanna Randy patient needs an appt.

## 2020-05-26 NOTE — Telephone Encounter (Signed)
Left detailed message on pt's vm advising appt. 

## 2020-05-31 ENCOUNTER — Encounter: Payer: Self-pay | Admitting: Family Medicine

## 2020-05-31 ENCOUNTER — Ambulatory Visit (INDEPENDENT_AMBULATORY_CARE_PROVIDER_SITE_OTHER): Payer: Medicare HMO | Admitting: Family Medicine

## 2020-05-31 ENCOUNTER — Other Ambulatory Visit: Payer: Self-pay

## 2020-05-31 VITALS — BP 132/79 | HR 82 | Temp 98.5°F | Resp 18 | Ht 66.0 in | Wt 189.0 lb

## 2020-05-31 DIAGNOSIS — R110 Nausea: Secondary | ICD-10-CM | POA: Diagnosis not present

## 2020-05-31 DIAGNOSIS — R531 Weakness: Secondary | ICD-10-CM | POA: Diagnosis not present

## 2020-05-31 DIAGNOSIS — K219 Gastro-esophageal reflux disease without esophagitis: Secondary | ICD-10-CM

## 2020-05-31 DIAGNOSIS — R5383 Other fatigue: Secondary | ICD-10-CM

## 2020-05-31 DIAGNOSIS — F339 Major depressive disorder, recurrent, unspecified: Secondary | ICD-10-CM

## 2020-05-31 DIAGNOSIS — E119 Type 2 diabetes mellitus without complications: Secondary | ICD-10-CM

## 2020-05-31 DIAGNOSIS — M791 Myalgia, unspecified site: Secondary | ICD-10-CM

## 2020-05-31 MED ORDER — SERTRALINE HCL 100 MG PO TABS: 100.0000 mg | ORAL_TABLET | Freq: Every day | ORAL | 1 refills | Status: DC

## 2020-05-31 NOTE — Progress Notes (Signed)
I,Leah Schaefer,acting as a scribe for Leah Durie, MD.,have documented all relevant documentation on the behalf of Leah Durie, MD,as directed by  Leah Durie, MD while in the presence of Leah Durie, MD.   Established patient visit   Patient: Leah Schaefer   DOB: 1941/07/26   79 y.o. Female  MRN: [id number] Visit Date: 05/31/2020  Today's healthcare provider: [name], MD   Chief Complaint  Patient presents with  . Fatigue   Subjective    HPI  Patient is here because she has not been feeling well lately. Patient states she has been feeling fatigued, off balance, generally unwell and has not had an appetite. Patient also states she is having symptoms of soft stools to diarrhea. Patient has been feeling like this for 2 weeks. Patient's husband died 07/19/2023 she has been in morning since then. She has fatigue weakness no energy and no appetite.     Medications: Outpatient Medications Prior to Visit  Medication Sig  . Accu-Chek Softclix Lancets lancets Use as instructed  . acetaminophen (TYLENOL) 500 MG tablet Take 500 mg by mouth every 6 (six) hours as needed for mild pain or headache.   . Alcohol Swabs (B-D SINGLE USE SWABS REGULAR) PADS CHECK SUGAR ONCE DAILY  . aspirin EC 81 MG tablet Take 81 mg by mouth daily. Swallow whole.  Marland Kitchen atorvastatin (LIPITOR) 40 MG tablet Take 1 tablet (40 mg total) by mouth every evening.  . bimatoprost (LUMIGAN) 0.01 % SOLN Place 1 drop into both eyes at bedtime.   . Blood Glucose Monitoring Suppl (ACCU-CHEK AVIVA PLUS) w/Device KIT USE AS DIRECTED  . Calcium Carb-Cholecalciferol (CALCIUM 600+D3) 600-800 MG-UNIT TABS Take 1 tablet by mouth 2 (two) times daily.  . cholecalciferol (VITAMIN D) 1000 units tablet Take 1,000 Units by mouth daily.  . cyanocobalamin 500 MCG tablet Take 1,000 mcg by mouth daily.   Marland Kitchen gabapentin (NEURONTIN) 300 MG capsule TAKE 2 CAPSULES AT BEDTIME  . glucose blood (ACCU-CHEK  AVIVA PLUS) test strip Check Sugar Once Daily  . latanoprost (XALATAN) 0.005 % ophthalmic solution Place 1 drop into both eyes at bedtime.   Marland Kitchen letrozole (FEMARA) 2.5 MG tablet Take 1 tablet (2.5 mg total) by mouth daily.  Marland Kitchen losartan (COZAAR) 50 MG tablet Take 1 tablet (50 mg total) by mouth daily.  . meclizine (ANTIVERT) 25 MG tablet Take 0.5 tablets (12.5 mg total) by mouth 3 (three) times daily as needed for dizziness.  . meloxicam (MOBIC) 15 MG tablet TAKE 1 TABLET EVERY DAY  . metFORMIN (GLUCOPHAGE) 1000 MG tablet Take 1 tablet (1,000 mg total) by mouth 2 (two) times daily with a meal.  . metoprolol tartrate (LOPRESSOR) 25 MG tablet Take 0.5 tablets (12.5 mg total) by mouth 2 (two) times daily.  . Multiple Vitamin (MULTIVITAMIN WITH MINERALS) TABS tablet Take 1 tablet by mouth daily.  Marland Kitchen omega-3 acid ethyl esters (LOVAZA) 1 g capsule Take 1 g by mouth daily.   . pantoprazole (PROTONIX) 40 MG tablet Take 1 tablet (40 mg total) by mouth 2 (two) times daily.  Marland Kitchen pyridoxine (B-6) 100 MG tablet Take 100 mg by mouth daily.  . vitamin C (ASCORBIC ACID) 500 MG tablet Take 1,000 mg by mouth daily.  . vitamin E 400 UNIT capsule Take 400 Units by mouth daily.  . [DISCONTINUED] sertraline (ZOLOFT) 50 MG tablet TAKE 1 TABLET EVERY DAY   No facility-administered medications prior to visit.    Review of  Systems  Constitutional: Positive for appetite change and fatigue. Negative for chills and fever.  Respiratory: Negative for chest tightness and shortness of breath.   Cardiovascular: Negative for chest pain and palpitations.  Gastrointestinal: Negative for abdominal pain, nausea and vomiting.  Neurological: Negative for dizziness and weakness.    Last hemoglobin A1c Lab Results  Component Value Date   HGBA1C 7.6 (A) 01/26/2020      Objective    BP 132/79 (BP Location: Right Arm, Patient Position: Sitting, Cuff Size: Large)   Pulse 82   Temp 98.5 F (36.9 C) (Oral)   Resp 18   Ht 5' 6"  (1.676 m)   Wt 189 lb (85.7 kg)   SpO2 99%   BMI 30.51 kg/m  BP Readings from Last 3 Encounters:  05/31/20 132/79  05/03/20 (!) 153/79  01/21/20 (!) 147/81   Wt Readings from Last 3 Encounters:  05/31/20 189 lb (85.7 kg)  05/03/20 197 lb (89.4 kg)  01/21/20 178 lb 3.2 oz (80.8 kg)      Physical Exam Vitals reviewed.  Constitutional:      Appearance: She is well-developed. She is obese.  HENT:     Head: Normocephalic and atraumatic.     Right Ear: External ear normal.     Left Ear: External ear normal.     Nose: Nose normal.  Eyes:     General: No scleral icterus. Neck:     Thyroid: No thyromegaly.  Cardiovascular:     Rate and Rhythm: Normal rate and regular rhythm.     Heart sounds: Normal heart sounds.  Pulmonary:     Effort: Pulmonary effort is normal.     Breath sounds: Normal breath sounds.  Abdominal:     Palpations: Abdomen is soft.  Skin:    General: Skin is warm and dry.  Neurological:     General: No focal deficit present.     Mental Status: She is alert and oriented to person, place, and time.  Psychiatric:        Behavior: Behavior normal.        Thought Content: Thought content normal.        Judgment: Judgment normal.     BP 132/79 (BP Location: Right Arm, Patient Position: Sitting, Cuff Size: Large)   Pulse 82   Temp 98.5 F (36.9 C) (Oral)   Resp 18   Ht 5' 6" (1.676 m)   Wt 189 lb (85.7 kg)   SpO2 99%   BMI 30.51 kg/m      No results found for any visits on 05/31/20.  Assessment & Plan     1. Myalgia  - Sed Rate (ESR) - CK (Creatine Kinase) - CBC w/Diff/Platelet - Comprehensive Metabolic Panel (CMET) - TSH - Lipase  2. Weakness  - Sed Rate (ESR) - CK (Creatine Kinase) - CBC w/Diff/Platelet - Comprehensive Metabolic Panel (CMET) - TSH - Lipase  3. Episode of recurrent major depressive disorder, unspecified depression episode severity ([place]) I will obtain lab work but I think the patient's symptoms are consistent with  depression.  Increase sertraline from 50 to 100 mg daily.  Return to clinic 1 month.  PHQ-9 on next visit. - Sed Rate (ESR) - CK (Creatine Kinase) - CBC w/Diff/Platelet - Comprehensive Metabolic Panel (CMET) - TSH - Lipase - sertraline (ZOLOFT) 100 MG tablet; Take 1 tablet (100 mg total) by mouth daily.  Dispense: 90 tablet; Refill: 1  4. Fatigue, unspecified type  - Sed Rate (ESR) - CK (  Creatine Kinase) - CBC w/Diff/Platelet - Comprehensive Metabolic Panel (CMET) - TSH - Lipase  5. Controlled type 2 diabetes mellitus without complication, without long-term current use of insulin (HCC)  - Sed Rate (ESR) - CK (Creatine Kinase) - CBC w/Diff/Platelet - Comprehensive Metabolic Panel (CMET) - TSH - Lipase  6. Gastroesophageal reflux disease without esophagitis  - Sed Rate (ESR) - CK (Creatine Kinase) - CBC w/Diff/Platelet - Comprehensive Metabolic Panel (CMET) - TSH - Lipase  7. Nausea  - Sed Rate (ESR) - CK (Creatine Kinase) - CBC w/Diff/Platelet - Comprehensive Metabolic Panel (CMET) - TSH - Lipase   Return in about 1 month (around 07/01/2020).         Krisinda Giovanni Cranford Mon, MD  [facility] [phone] (phone) [phone] (fax)  [place]

## 2020-06-01 ENCOUNTER — Telehealth: Payer: Self-pay | Admitting: Pharmacist

## 2020-06-01 LAB — CBC WITH DIFFERENTIAL/PLATELET
Basophils Absolute: 0.1 10*3/uL (ref 0.0–0.2)
Basos: 1 %
EOS (ABSOLUTE): 0.2 10*3/uL (ref 0.0–0.4)
Eos: 2 %
Hematocrit: 43 % (ref 34.0–46.6)
Hemoglobin: 14.7 g/dL (ref 11.1–15.9)
Immature Grans (Abs): 0 10*3/uL (ref 0.0–0.1)
Immature Granulocytes: 0 %
Lymphocytes Absolute: 2.1 10*3/uL (ref 0.7–3.1)
Lymphs: 28 %
MCH: 31 pg (ref 26.6–33.0)
MCHC: 34.2 g/dL (ref 31.5–35.7)
MCV: 91 fL (ref 79–97)
Monocytes Absolute: 0.6 10*3/uL (ref 0.1–0.9)
Monocytes: 8 %
Neutrophils Absolute: 4.6 10*3/uL (ref 1.4–7.0)
Neutrophils: 61 %
Platelets: 274 10*3/uL (ref 150–450)
RBC: 4.74 x10E6/uL (ref 3.77–5.28)
RDW: 12.6 % (ref 11.7–15.4)
WBC: 7.5 10*3/uL (ref 3.4–10.8)

## 2020-06-01 LAB — COMPREHENSIVE METABOLIC PANEL
ALT: 13 IU/L (ref 0–32)
AST: 13 IU/L (ref 0–40)
Albumin/Globulin Ratio: 1.7 (ref 1.2–2.2)
Albumin: 4.7 g/dL (ref 3.7–4.7)
Alkaline Phosphatase: 76 IU/L (ref 44–121)
BUN/Creatinine Ratio: 20 (ref 12–28)
BUN: 16 mg/dL (ref 8–27)
Bilirubin Total: <0.2 mg/dL (ref 0.0–1.2)
CO2: 26 mmol/L (ref 20–29)
Calcium: 10.6 mg/dL — ABNORMAL HIGH (ref 8.7–10.3)
Chloride: 102 mmol/L (ref 96–106)
Creatinine, Ser: 0.79 mg/dL (ref 0.57–1.00)
GFR calc Af Amer: 83 mL/min/{1.73_m2} (ref >59)
GFR calc non Af Amer: 72 mL/min/{1.73_m2} (ref >59)
Globulin, Total: 2.7 g/dL (ref 1.5–4.5)
Glucose: 128 mg/dL — ABNORMAL HIGH (ref 65–99)
Potassium: 4.7 mmol/L (ref 3.5–5.2)
Sodium: 142 mmol/L (ref 134–144)
Total Protein: 7.4 g/dL (ref 6.0–8.5)

## 2020-06-01 LAB — TSH: TSH: 1.76 u[IU]/mL (ref 0.450–4.500)

## 2020-06-01 LAB — SEDIMENTATION RATE: Sed Rate: 5 mm/hr (ref 0–40)

## 2020-06-01 LAB — CK: Total CK: 72 U/L (ref 32–182)

## 2020-06-01 LAB — LIPASE: Lipase: 37 U/L (ref 14–85)

## 2020-06-01 NOTE — Progress Notes (Signed)
Chronic Care Management Pharmacy Assistant   Name: Leah Schaefer  MRN: [id number] DOB: 05-31-1941  Reason for Encounter: Medication Review  Patient Questions:  1.  Have you seen any other providers since your last visit? No  2.  Any changes in your medicines or health? No   Leah Schaefer,  79 y.o. , female presents for their Follow-Up CCM visit with the clinical pharmacist via telephone.  PCP : Leah Schaefer., MD  Allergies:  No Known Allergies  Medications: Outpatient Encounter Medications as of 06/01/2020  Medication Sig  . Accu-Chek Softclix Lancets lancets Use as instructed  . acetaminophen (TYLENOL) 500 MG tablet Take 500 mg by mouth every 6 (six) hours as needed for mild pain or headache.   . Alcohol Swabs (B-D SINGLE USE SWABS REGULAR) PADS CHECK SUGAR ONCE DAILY  . aspirin EC 81 MG tablet Take 81 mg by mouth daily. Swallow whole.  Marland Kitchen atorvastatin (LIPITOR) 40 MG tablet Take 1 tablet (40 mg total) by mouth every evening.  . bimatoprost (LUMIGAN) 0.01 % SOLN Place 1 drop into both eyes at bedtime.   . Blood Glucose Monitoring Suppl (ACCU-CHEK AVIVA PLUS) w/Device KIT USE AS DIRECTED  . Calcium Carb-Cholecalciferol (CALCIUM 600+D3) 600-800 MG-UNIT TABS Take 1 tablet by mouth 2 (two) times daily.  . cholecalciferol (VITAMIN D) 1000 units tablet Take 1,000 Units by mouth daily.  . cyanocobalamin 500 MCG tablet Take 1,000 mcg by mouth daily.   Marland Kitchen gabapentin (NEURONTIN) 300 MG capsule TAKE 2 CAPSULES AT BEDTIME  . glucose blood (ACCU-CHEK AVIVA PLUS) test strip Check Sugar Once Daily  . latanoprost (XALATAN) 0.005 % ophthalmic solution Place 1 drop into both eyes at bedtime.   Marland Kitchen letrozole (FEMARA) 2.5 MG tablet Take 1 tablet (2.5 mg total) by mouth daily.  Marland Kitchen losartan (COZAAR) 50 MG tablet Take 1 tablet (50 mg total) by mouth daily.  . meclizine (ANTIVERT) 25 MG tablet Take 0.5 tablets (12.5 mg total) by mouth 3 (three) times daily as needed for dizziness.  .  meloxicam (MOBIC) 15 MG tablet TAKE 1 TABLET EVERY DAY  . metFORMIN (GLUCOPHAGE) 1000 MG tablet Take 1 tablet (1,000 mg total) by mouth 2 (two) times daily with a meal.  . metoprolol tartrate (LOPRESSOR) 25 MG tablet Take 0.5 tablets (12.5 mg total) by mouth 2 (two) times daily.  . Multiple Vitamin (MULTIVITAMIN WITH MINERALS) TABS tablet Take 1 tablet by mouth daily.  Marland Kitchen omega-3 acid ethyl esters (LOVAZA) 1 g capsule Take 1 g by mouth daily.   . pantoprazole (PROTONIX) 40 MG tablet Take 1 tablet (40 mg total) by mouth 2 (two) times daily.  Marland Kitchen pyridoxine (B-6) 100 MG tablet Take 100 mg by mouth daily.  . sertraline (ZOLOFT) 100 MG tablet Take 1 tablet (100 mg total) by mouth daily.  . vitamin C (ASCORBIC ACID) 500 MG tablet Take 1,000 mg by mouth daily.  . vitamin E 400 UNIT capsule Take 400 Units by mouth daily.   No facility-administered encounter medications on file as of 06/01/2020.    Current Diagnosis: Patient Active Problem List   Diagnosis Date Noted  . Left sided numbness 11/06/2018  . 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 Umbilical pain 67/61/9509  . Breast mass, left 04/07/2016  . Controlled type 2 diabetes mellitus without complication ([place]) 32/67/1245  . Allergic rhinitis 03/30/2015  . Anxiety 03/30/2015  . Carotid arterial disease ([place]) 03/30/2015  . Diabetes mellitus, type 2 ([place]) 03/30/2015  . Essential (  primary) hypertension 03/30/2015  . Hypercholesteremia 03/30/2015  . Left leg pain 03/30/2015  . LBP (low back pain) 03/30/2015  . Neuropathic pain 03/30/2015  . Burning or prickling sensation 03/30/2015  . Neuralgia neuritis, sciatic nerve 03/30/2015  . Skin cyst 11/24/2014  . Carcinoma of upper-outer quadrant of right breast in female, estrogen receptor positive ([place]) 08/17/2011  . Allergic reaction 11/29/2009  . Adaptation reaction 04/30/2009  . Acid reflux 04/30/2009  . Stricture and stenosis of cervix 01/28/2009    Goals Addressed   None     Follow-Up:   Coordination of Enhanced Pharmacy Services   Reviewed chart and adherence measures. Per insurance data patient is 100% compliant with cholesterol medication adherence and 40-49% compliant with diabetes medication adherence.

## 2020-06-09 ENCOUNTER — Other Ambulatory Visit: Payer: Self-pay

## 2020-06-09 ENCOUNTER — Ambulatory Visit (INDEPENDENT_AMBULATORY_CARE_PROVIDER_SITE_OTHER): Payer: Medicare HMO | Admitting: Adult Health

## 2020-06-09 ENCOUNTER — Encounter: Payer: Self-pay | Admitting: Adult Health

## 2020-06-09 ENCOUNTER — Ambulatory Visit: Payer: Self-pay | Admitting: *Deleted

## 2020-06-09 VITALS — BP 147/81 | HR 93 | Temp 98.5°F | Resp 16 | Wt 183.6 lb

## 2020-06-09 DIAGNOSIS — R82998 Other abnormal findings in urine: Secondary | ICD-10-CM | POA: Diagnosis not present

## 2020-06-09 DIAGNOSIS — R399 Unspecified symptoms and signs involving the genitourinary system: Secondary | ICD-10-CM | POA: Diagnosis not present

## 2020-06-09 DIAGNOSIS — F4321 Adjustment disorder with depressed mood: Secondary | ICD-10-CM | POA: Insufficient documentation

## 2020-06-09 DIAGNOSIS — N390 Urinary tract infection, site not specified: Secondary | ICD-10-CM | POA: Diagnosis not present

## 2020-06-09 DIAGNOSIS — F339 Major depressive disorder, recurrent, unspecified: Secondary | ICD-10-CM | POA: Diagnosis not present

## 2020-06-09 LAB — POCT URINALYSIS DIPSTICK
Bilirubin, UA: NEGATIVE
Blood, UA: NEGATIVE
Glucose, UA: NEGATIVE
Ketones, UA: NEGATIVE
Nitrite, UA: NEGATIVE
Protein, UA: POSITIVE — AB
Spec Grav, UA: 1.01 (ref 1.010–1.025)
Urobilinogen, UA: 0.2 E.U./dL
pH, UA: 7 (ref 5.0–8.0)

## 2020-06-09 MED ORDER — CEPHALEXIN 500 MG PO CAPS: 500.0000 mg | ORAL_CAPSULE | Freq: Three times a day (TID) | ORAL | 0 refills | Status: DC

## 2020-06-09 MED ORDER — SERTRALINE HCL 50 MG PO TABS: 75.0000 mg | ORAL_TABLET | Freq: Every day | ORAL | 1 refills | Status: DC

## 2020-06-09 NOTE — Telephone Encounter (Signed)
No appetite, back and waist pain, loose stools - come and go, dizziness  Reason for Disposition . MODERATE pain (e.g., interferes with normal activities or awakens from sleep)  Answer Assessment - Initial Assessment Questions 1. ONSET: "When did the pain begin?"      Worse this week 2. LOCATION: "Where does it hurt?" (upper, mid or lower back)     Both side- more on R side 3. SEVERITY: "How bad is the pain?"  (e.g., Scale 1-10; mild, moderate, or severe)   - MILD (1-3): doesn't interfere with normal activities    - MODERATE (4-7): interferes with normal activities or awakens from sleep    - SEVERE (8-10): excruciating pain, unable to do any normal activities      Moderate/severe 4. PATTERN: "Is the pain constant?" (e.g., yes, no; constant, intermittent)      constant 5. RADIATION: "Does the pain shoot into your legs or elsewhere?"     Pain goes down legs and up back 6. CAUSE:  "What do you think is causing the back pain?"      Patient is not sure- no back injury- history of cancer 7. BACK OVERUSE:  "Any recent lifting of heavy objects, strenuous work or exercise?"     no 8. MEDICATIONS: "What have you taken so far for the pain?" (e.g., nothing, acetaminophen, NSAIDS)     tylenol 9. NEUROLOGIC SYMPTOMS: "Do you have any weakness, numbness, or problems with bowel/bladder control?"     Leg weakness- both sides 10. OTHER SYMPTOMS: "Do you have any other symptoms?" (e.g., fever, abdominal pain, burning with urination, blood in urine)       Abdominal pain 11. PREGNANCY: "Is there any chance you are pregnant?" (e.g., yes, no; LMP)       n/a  Answer Assessment - Initial Assessment Questions 1. LOCATION: "Where does it hurt?" (e.g., left, right)     Bilateral back pain- more on R side of back 2. ONSET: "When did the pain start?"     Ongoing- worse this week 3. SEVERITY: "How bad is the pain?" (e.g., Scale 1-10; mild, moderate, or severe)   - MILD (1-3): doesn't interfere with normal  activities    - MODERATE (4-7): interferes with normal activities or awakens from sleep    - SEVERE (8-10): excruciating pain and patient unable to do normal activities (stays in bed)       Moderate - patient states the pain is bring her down 4. PATTERN: "Does the pain come and go, or is it constant?"      constant 5. CAUSE: "What do you think is causing the pain?"     unsure 6. OTHER SYMPTOMS:  "Do you have any other symptoms?" (e.g., fever, abdominal pain, vomiting, leg weakness, burning with urination, blood in urine)     Abdominal pain, leg weakness 7. PREGNANCY:  "Is there any chance you are pregnant?" "When was your last menstrual period?"     n/a  Protocols used: FLANK PAIN-A-AH, BACK PAIN-A-AH

## 2020-06-09 NOTE — Progress Notes (Signed)
Established patient visit   Patient: Leah Schaefer   DOB: [birth year]   79 y.o. Female  MRN: [id number] Visit Date: 06/09/2020  Today's healthcare provider: Marcille Buffy, FNP   Chief Complaint  Patient presents with  . Urinary Tract Infection   Subjective    Back Pain This is a new problem. The current episode started 1 to 4 weeks ago. The problem is unchanged. The pain is present in the lumbar spine. Quality: sharp. Radiates to: lower abdomen. The pain is the same all the time. Associated symptoms include abdominal pain, dysuria and weakness. Pertinent negatives include no bladder incontinence, bowel incontinence, chest pain, fever, headaches, leg pain, numbness, paresis, paresthesias, pelvic pain, perianal numbness, tingling or weight loss.    Denies any history of injury.  Onset one week ago.   Dysuria, burning with urination. Denies any upper back pain or hematuria only low mid back pain.   Denies any loss of bowel or bladder control.  Denies saddle paresthesias.  Denies radiculopathy/ paresthesias.     She says she has decreased appetite for 1 week, she does not want to eat but she is hungry. She reports she has stopped her Sertraline when she picked up her sertraline 100 mg at last visit with Dr. Rosanna Randy 05/31/20. She was increased from 50 mg to 100 mg and reports she read the side effects of Sertraline on the bag and decided she would just  stop it all together. She has not been taking any , she feels more shaky inside the past two days and has had increased anger with telemarketers she reports. She reports she has just not been right since her husband died. Not sleeping as well now.    Labs reviewed, calcium was elevated Jerrol Banana., MD stopped calcium. She is advised of this at office.    Patient  denies any fever, chills, rash, chest pain, shortness of breath, nausea, vomiting, or diarrhea.  Denies dizziness, lightheadedness, pre syncopal or  syncopal episodes.    Patient Active Problem List   Diagnosis Date Noted  . Leukocytes in urine 06/09/2020  . Grief 06/09/2020  . Episode of recurrent major depressive disorder ([place]) 06/09/2020  . Urinary symptom or sign 06/09/2020  . Left sided numbness 11/06/2018  . 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 Umbilical pain 14/48/1856  . Breast mass, left 04/07/2016  . Controlled type 2 diabetes mellitus without complication ([place]) 31/49/7026  . Allergic rhinitis 03/30/2015  . Anxiety 03/30/2015  . Carotid arterial disease ([place]) 03/30/2015  . Diabetes mellitus, type 2 ([place]) 03/30/2015  . Essential (primary) hypertension 03/30/2015  . Hypercholesteremia 03/30/2015  . Left leg pain 03/30/2015  . LBP (low back pain) 03/30/2015  . Neuropathic pain 03/30/2015  . Burning or prickling sensation 03/30/2015  . Neuralgia neuritis, sciatic nerve 03/30/2015  . Skin cyst 11/24/2014  . Carcinoma of upper-outer quadrant of right breast in female, estrogen receptor positive ([place]) 08/17/2011  . Allergic reaction 11/29/2009  . Adaptation reaction 04/30/2009  . Acid reflux 04/30/2009  . Stricture and stenosis of cervix 01/28/2009   Past Medical History:  Diagnosis Date  . Breast cancer ([place]) 2013   LEFT breast with chemo and rad tx  . Breast cancer ([place]) 2017   right breast with rad tx  . Breast cancer of upper-inner quadrant of right female breast ([place]) 09/21/2015   T1bN0 " 68m; ER100%, PR 90%, HER-2/neu not overexpressed.  . Cystitis   . Diabetes mellitus without  complication ([facility])    NO MEDS-DIET CONTROLLED  . GERD (gastroesophageal reflux disease)   . Hernia 2011  . Hyperlipidemia   . Hypertension   . Malignant neoplasm of upper-inner quadrant of female breast ([place]) 08/17/2011   Left, T2 (2.3 cm) N0, triple negative. Chemotherapy/post wide excision whole breast radiation.  . 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 Personal history of chemotherapy 2013    LEFT BREAST  . Personal history of malignant neoplasm of breast   . Personal history of radiation therapy 2017    Rt, 2013 had on left  . Vertigo        Medications: Outpatient Medications Prior to Visit  Medication Sig  . Accu-Chek Softclix Lancets lancets Use as instructed  . acetaminophen (TYLENOL) 500 MG tablet Take 500 mg by mouth every 6 (six) hours as needed for mild pain or headache.   . Alcohol Swabs (B-D SINGLE USE SWABS REGULAR) PADS CHECK SUGAR ONCE DAILY  . aspirin EC 81 MG tablet Take 81 mg by mouth daily. Swallow whole.  Marland Kitchen atorvastatin (LIPITOR) 40 MG tablet Take 1 tablet (40 mg total) by mouth every evening.  . bimatoprost (LUMIGAN) 0.01 % SOLN Place 1 drop into both eyes at bedtime.   . Blood Glucose Monitoring Suppl (ACCU-CHEK AVIVA PLUS) w/Device KIT USE AS DIRECTED  . Calcium Carb-Cholecalciferol (CALCIUM 600+D3) 600-800 MG-UNIT TABS Take 1 tablet by mouth 2 (two) times daily.  . cholecalciferol (VITAMIN D) 1000 units tablet Take 1,000 Units by mouth daily.  . cyanocobalamin 500 MCG tablet Take 1,000 mcg by mouth daily.   Marland Kitchen gabapentin (NEURONTIN) 300 MG capsule TAKE 2 CAPSULES AT BEDTIME  . glucose blood (ACCU-CHEK AVIVA PLUS) test strip Check Sugar Once Daily  . latanoprost (XALATAN) 0.005 % ophthalmic solution Place 1 drop into both eyes at bedtime.   Marland Kitchen letrozole (FEMARA) 2.5 MG tablet Take 1 tablet (2.5 mg total) by mouth daily.  Marland Kitchen losartan (COZAAR) 50 MG tablet Take 1 tablet (50 mg total) by mouth daily.  . meclizine (ANTIVERT) 25 MG tablet Take 0.5 tablets (12.5 mg total) by mouth 3 (three) times daily as needed for dizziness.  . meloxicam (MOBIC) 15 MG tablet TAKE 1 TABLET EVERY DAY  . metFORMIN (GLUCOPHAGE) 1000 MG tablet Take 1 tablet (1,000 mg total) by mouth 2 (two) times daily with a meal.  . metoprolol tartrate (LOPRESSOR) 25 MG tablet Take 0.5 tablets (12.5 mg total) by mouth 2 (two) times daily.  . Multiple Vitamin (MULTIVITAMIN WITH MINERALS) TABS  tablet Take 1 tablet by mouth daily.  Marland Kitchen omega-3 acid ethyl esters (LOVAZA) 1 g capsule Take 1 g by mouth daily.   . pantoprazole (PROTONIX) 40 MG tablet Take 1 tablet (40 mg total) by mouth 2 (two) times daily.  Marland Kitchen pyridoxine (B-6) 100 MG tablet Take 100 mg by mouth daily.  . vitamin C (ASCORBIC ACID) 500 MG tablet Take 1,000 mg by mouth daily.  . vitamin E 400 UNIT capsule Take 400 Units by mouth daily.  . [DISCONTINUED] sertraline (ZOLOFT) 100 MG tablet Take 1 tablet (100 mg total) by mouth daily.   No facility-administered medications prior to visit.    Review of Systems  Constitutional: Negative for fever and weight loss.  Cardiovascular: Negative for chest pain.  Gastrointestinal: Positive for abdominal pain. Negative for bowel incontinence.  Genitourinary: Positive for dysuria. Negative for bladder incontinence and pelvic pain.  Musculoskeletal: Positive for back pain.  Neurological: Positive for  weakness. Negative for tingling, numbness, headaches and paresthesias.    Last CBC Lab Results  Component Value Date   WBC 7.5 05/31/2020   HGB 14.7 05/31/2020   HCT 43.0 05/31/2020   MCV 91 05/31/2020   MCH 31.0 05/31/2020   RDW 12.6 05/31/2020   PLT 274 26/71/2458   Last metabolic panel Lab Results  Component Value Date   GLUCOSE 128 (H) 05/31/2020   NA 142 05/31/2020   K 4.7 05/31/2020   CL 102 05/31/2020   CO2 26 05/31/2020   BUN 16 05/31/2020   CREATININE 0.79 05/31/2020   GFRNONAA 72 05/31/2020   GFRAA 83 05/31/2020   CALCIUM 10.6 (H) 05/31/2020   PROT 7.4 05/31/2020   ALBUMIN 4.7 05/31/2020   LABGLOB 2.7 05/31/2020   AGRATIO 1.7 05/31/2020   BILITOT <0.2 05/31/2020   ALKPHOS 76 05/31/2020   AST 13 05/31/2020   ALT 13 05/31/2020   ANIONGAP 6 11/22/2018   Last lipids Lab Results  Component Value Date   CHOL 111 01/21/2020   HDL 37 (L) 01/21/2020   LDLCALC 59 01/21/2020   TRIG 70 01/21/2020   CHOLHDL 3.0 01/21/2020   Last hemoglobin A1c Lab Results    Component Value Date   HGBA1C 7.6 (A) 01/26/2020   Last thyroid functions Lab Results  Component Value Date   TSH 1.760 05/31/2020   Last vitamin D No results found for: 25OHVITD2, 25OHVITD3, VD25OH Last vitamin B12 and Folate No results found for: VITAMINB12, FOLATE    Objective    BP (!) 147/81   Pulse 93   Temp 98.5 F (36.9 C) (Oral)   Resp 16   Wt 183 lb 9.6 oz (83.3 kg)   SpO2 98%   BMI 29.63 kg/m  BP Readings from Last 3 Encounters:  06/09/20 (!) 147/81  05/31/20 132/79  05/03/20 (!) 153/79      Physical Exam Vitals reviewed.  Constitutional:      General: She is not in acute distress.    Appearance: Normal appearance. She is not ill-appearing, toxic-appearing or diaphoretic.     Comments: Patient is alert and oriented and responsive to questions Engages in eye contact with provider. Speaks in full sentences without any pauses without any shortness of breath or distress.    HENT:     Head: Normocephalic.     Right Ear: External ear normal.     Left Ear: External ear normal.     Nose: Nose normal.     Mouth/Throat:     Mouth: Mucous membranes are moist.     Pharynx: No oropharyngeal exudate or posterior oropharyngeal erythema.  Eyes:     General: No scleral icterus.    Extraocular Movements: Extraocular movements intact.     Conjunctiva/sclera: Conjunctivae normal.     Pupils: Pupils are equal, round, and reactive to light.  Cardiovascular:     Rate and Rhythm: Normal rate and regular rhythm.     Pulses: Normal pulses.     Heart sounds: Normal heart sounds. No murmur heard.  No friction rub. No gallop.   Pulmonary:     Effort: Pulmonary effort is normal. No respiratory distress.     Breath sounds: Normal breath sounds. No stridor. No wheezing, rhonchi or rales.  Chest:     Chest wall: No tenderness.  Abdominal:     General: There is no distension.     Palpations: Abdomen is soft.     Tenderness: There is no abdominal tenderness. There is no  guarding.  Musculoskeletal:        General: No tenderness or signs of injury. Normal range of motion.     Cervical back: Normal range of motion and neck supple.  Lymphadenopathy:     Cervical: No cervical adenopathy.  Skin:    General: Skin is warm.     Findings: No erythema or rash.  Neurological:     General: No focal deficit present.     Mental Status: She is oriented to person, place, and time.     Motor: No weakness.     Coordination: Coordination normal.     Gait: Gait normal.     Deep Tendon Reflexes: Reflexes normal.  Psychiatric:        Mood and Affect: Mood normal.        Thought Content: Thought content normal.        Judgment: Judgment normal.      Results for orders placed or performed in visit on 06/09/20  POCT urinalysis dipstick  Result Value Ref Range   Color, UA dark yellow    Clarity, UA clear    Glucose, UA Negative Negative   Bilirubin, UA negative    Ketones, UA negative    Spec Grav, UA 1.010 1.010 - 1.025   Blood, UA negative    pH, UA 7.0 5.0 - 8.0   Protein, UA Positive (A) Negative   Urobilinogen, UA 0.2 0.2 or 1.0 E.U./dL   Nitrite, UA negative    Leukocytes, UA Moderate (2+) (A) Negative   Appearance     Odor      Assessment & Plan     1. Urinary tract infection without hematuria, site unspecified Start  Keflex, she will be called if urine culture shows need for change in antibiotic therapy. - Urine culture - POCT urinalysis dipstick - cephALEXin (KEFLEX) 500 MG capsule; Take 1 capsule (500 mg total) by mouth 3 (three) times daily.  Dispense: 21 capsule; Refill: 0  2. Leukocytes in urine On Point of care test in office. Send for culture.   3. Episode of recurrent major depressive disorder, unspecified depression episode severity ([place]) She was previously on 50 mg of Zoloft, and when increased to 100 mg she decided not to take any Zoloft. She discontinued it herself after reading package.  She is is agreement to try taking 75 mg of  Zoloft which will be 1.23m tablets. She will not take the 100 mg tablets. Patient verbalized understanding of all instructions given and denies any further questions at this time. Discussed side effects of abruptly stopping medication and to call if she can not tolerate.   - sertraline (ZOLOFT) 50 MG tablet; Take 1.5 tablets (75 mg total) by mouth daily.  Dispense: 140 tablet; Refill: 1  Medications Discontinued During This Encounter  Medication Reason  . sertraline (ZOLOFT) 100 MG tablet Patient Preference   4. Grief Zoloft should help. Call if can not tolerate.  Recommend counseling.Call office if worsening.   Orders Placed This Encounter  Procedures  . Urine culture  . POCT urinalysis dipstick    Red Flags discussed. The patient was given clear instructions to go to ER or return to medical center if any red flags develop, symptoms do not improve, worsen or new problems develop. They verbalized understanding.  Return if urinary symptoms persist, worsen or do not resolve at anytime.   One month follow up on depression/ anxiety/ grief.   Return in about 1 month (around 07/09/2020), or if symptoms worsen  or fail to improve, for at any time for any worsening symptoms.       Addressed acute and or chronic medical problems today requiring 42 minutes reviewing patients medical record,labs, counseling patient regarding patient's conditions, any medications, answering questions regarding health, and coordination of care as needed. After visit summary patient given copy and reviewed.   Marcille Buffy, [place] [phone] (phone) [phone] (fax)  [place]

## 2020-06-09 NOTE — Telephone Encounter (Signed)
Noted and seen patient in office. Thanks

## 2020-06-09 NOTE — Patient Instructions (Signed)
Sertraline tablets What is this medicine? SERTRALINE (SER tra leen) is used to treat depression. It may also be used to treat obsessive compulsive disorder, panic disorder, post-trauma stress, premenstrual dysphoric disorder (PMDD) or social anxiety. This medicine may be used for other purposes; ask your health care provider or pharmacist if you have questions. COMMON BRAND NAME(S): Zoloft What should I tell my health care provider before I take this medicine? They need to know if you have any of these conditions:  bleeding disorders  bipolar disorder or a family history of bipolar disorder  glaucoma  heart disease  high blood pressure  history of irregular heartbeat  history of low levels of calcium, magnesium, or potassium in the blood  if you often drink alcohol  liver disease  receiving electroconvulsive therapy  seizures  suicidal thoughts, plans, or attempt; a previous suicide attempt by you or a family member  take medicines that treat or prevent blood clots  thyroid disease  an unusual or allergic reaction to sertraline, other medicines, foods, dyes, or preservatives  pregnant or trying to get pregnant  breast-feeding How should I use this medicine? Take this medicine by mouth with a glass of water. Follow the directions on the prescription label. You can take it with or without food. Take your medicine at regular intervals. Do not take your medicine more often than directed. Do not stop taking this medicine suddenly except upon the advice of your doctor. Stopping this medicine too quickly may cause serious side effects or your condition may worsen. A special MedGuide will be given to you by the pharmacist with each prescription and refill. Be sure to read this information carefully each time. Talk to your pediatrician regarding the use of this medicine in children. While this drug may be prescribed for children as young as 7 years for selected conditions,  precautions do apply. Overdosage: If you think you have taken too much of this medicine contact a poison control center or emergency room at once. NOTE: This medicine is only for you. Do not share this medicine with others. What if I miss a dose? If you miss a dose, take it as soon as you can. If it is almost time for your next dose, take only that dose. Do not take double or extra doses. What may interact with this medicine? Do not take this medicine with any of the following medications:  cisapride  dronedarone  linezolid  MAOIs like Carbex, Eldepryl, Marplan, Nardil, and Parnate  methylene blue (injected into a vein)  pimozide  thioridazine This medicine may also interact with the following medications:  alcohol  amphetamines  aspirin and aspirin-like medicines  certain medicines for depression, anxiety, or psychotic disturbances  certain medicines for fungal infections like ketoconazole, fluconazole, posaconazole, and itraconazole  certain medicines for irregular heart beat like flecainide, quinidine, propafenone  certain medicines for migraine headaches like almotriptan, eletriptan, frovatriptan, naratriptan, rizatriptan, sumatriptan, zolmitriptan  certain medicines for sleep  certain medicines for seizures like carbamazepine, valproic acid, phenytoin  certain medicines that treat or prevent blood clots like warfarin, enoxaparin, dalteparin  cimetidine  digoxin  diuretics  fentanyl  isoniazid  lithium  NSAIDs, medicines for pain and inflammation, like ibuprofen or naproxen  other medicines that prolong the QT interval (cause an abnormal heart rhythm) like dofetilide  rasagiline  safinamide  supplements like St. John's wort, kava kava, valerian  tolbutamide  tramadol  tryptophan This list may not describe all possible interactions. Give your health care provider  a list of all the medicines, herbs, non-prescription drugs, or dietary supplements  you use. Also tell them if you smoke, drink alcohol, or use illegal drugs. Some items may interact with your medicine. What should I watch for while using this medicine? Tell your doctor if your symptoms do not get better or if they get worse. Visit your doctor or health care professional for regular checks on your progress. Because it may take several weeks to see the full effects of this medicine, it is important to continue your treatment as prescribed by your doctor. Patients and their families should watch out for new or worsening thoughts of suicide or depression. Also watch out for sudden changes in feelings such as feeling anxious, agitated, panicky, irritable, hostile, aggressive, impulsive, severely restless, overly excited and hyperactive, or not being able to sleep. If this happens, especially at the beginning of treatment or after a change in dose, call your health care professional. Dennis Bast may get drowsy or dizzy. Do not drive, use machinery, or do anything that needs mental alertness until you know how this medicine affects you. Do not stand or sit up quickly, especially if you are an older patient. This reduces the risk of dizzy or fainting spells. Alcohol may interfere with the effect of this medicine. Avoid alcoholic drinks. Your mouth may get dry. Chewing sugarless gum or sucking hard candy, and drinking plenty of water may help. Contact your doctor if the problem does not go away or is severe. What side effects may I notice from receiving this medicine? Side effects that you should report to your doctor or health care professional as soon as possible:  allergic reactions like skin rash, itching or hives, swelling of the face, lips, or tongue  anxious  black, tarry stools  changes in vision  confusion  elevated mood, decreased need for sleep, racing thoughts, impulsive behavior  eye pain  fast, irregular heartbeat  feeling faint or lightheaded, falls  feeling agitated,  angry, or irritable  hallucination, loss of contact with reality  loss of balance or coordination  loss of memory  painful or prolonged erections  restlessness, pacing, inability to keep still  seizures  stiff muscles  suicidal thoughts or other mood changes  trouble sleeping  unusual bleeding or bruising  unusually weak or tired  vomiting Side effects that usually do not require medical attention (report to your doctor or health care professional if they continue or are bothersome):  change in appetite or weight  change in sex drive or performance  diarrhea  increased sweating  indigestion, nausea  tremors This list may not describe all possible side effects. Call your doctor for medical advice about side effects. You may report side effects to FDA at 1-800-FDA-1088. Where should I keep my medicine? Keep out of the reach of children. Store at room temperature between 15 and 30 degrees C (59 and 86 degrees F). Throw away any unused medicine after the expiration date. NOTE: This sheet is a summary. It may not cover all possible information. If you have questions about this medicine, talk to your doctor, pharmacist, or health care provider.  2020 Elsevier/Gold Standard (2018-07-16 10:09:27) Cephalexin Tablets or Capsules What is this medicine? CEPHALEXIN (sef a LEX in) is a cephalosporin antibiotic. It treats some infections caused by bacteria. It will not work for colds, the flu, or other viruses. This medicine may be used for other purposes; ask your health care provider or pharmacist if you have questions. COMMON BRAND NAME(S):  Biocef, Daxbia, Keflex, Keftab What should I tell my health care provider before I take this medicine? They need to know if you have any of these conditions:  kidney disease  stomach or intestine problems, especially colitis  an unusual or allergic reaction to cephalexin, other cephalosporins, penicillins, other antibiotics, medicines,  foods, dyes or preservatives  pregnant or trying to get pregnant  breast-feeding How should I use this medicine? Take this drug by mouth. Take it as directed on the prescription label at the same time every day. You can take it with or without food. If it upsets your stomach, take it with food. Take all of this drug unless your health care provider tells you to stop it early. Keep taking it even if you think you are better. Talk to your health care provider about the use of this drug in children. While it may be prescribed for selected conditions, precautions do apply. Overdosage: If you think you have taken too much of this medicine contact a poison control center or emergency room at once. NOTE: This medicine is only for you. Do not share this medicine with others. What if I miss a dose? If you miss a dose, take it as soon as you can. If it is almost time for your next dose, take only that dose. Do not take double or extra doses. What may interact with this medicine?  probenecid  some other antibiotics This list may not describe all possible interactions. Give your health care provider a list of all the medicines, herbs, non-prescription drugs, or dietary supplements you use. Also tell them if you smoke, drink alcohol, or use illegal drugs. Some items may interact with your medicine. What should I watch for while using this medicine? Tell your doctor or health care provider if your symptoms do not begin to improve in a few days. This medicine may cause serious skin reactions. They can happen weeks to months after starting the medicine. Contact your health care provider right away if you notice fevers or flu-like symptoms with a rash. The rash may be red or purple and then turn into blisters or peeling of the skin. Or, you might notice a red rash with swelling of the face, lips or lymph nodes in your neck or under your arms. Do not treat diarrhea with over the counter products. Contact your  doctor if you have diarrhea that lasts more than 2 days or if it is severe and watery. If you have diabetes, you may get a false-positive result for sugar in your urine. Check with your doctor or health care provider. What side effects may I notice from receiving this medicine? Side effects that you should report to your doctor or health care professional as soon as possible:  allergic reactions like skin rash, itching or hives, swelling of the face, lips, or tongue  breathing problems  pain or trouble passing urine  redness, blistering, peeling or loosening of the skin, including inside the mouth  severe or watery diarrhea  unusually weak or tired  yellowing of the eyes, skin Side effects that usually do not require medical attention (report to your doctor or health care professional if they continue or are bothersome):  gas or heartburn  genital or anal irritation  headache  joint or muscle pain  nausea, vomiting This list may not describe all possible side effects. Call your doctor for medical advice about side effects. You may report side effects to FDA at 1-800-FDA-1088. Where should I keep  my medicine? Keep out of the reach of children and pets. Store at room temperature between 20 and 25 degrees C (68 and 77 degrees F). Throw away any unused drug after the expiration date. NOTE: This sheet is a summary. It may not cover all possible information. If you have questions about this medicine, talk to your doctor, pharmacist, or health care provider.  2020 Elsevier/Gold Standard (2019-02-28 11:27:00) Generalized Anxiety Disorder, Adult Generalized anxiety disorder (GAD) is a mental health disorder. People with this condition constantly worry about everyday events. Unlike normal anxiety, worry related to GAD is not triggered by a specific event. These worries also do not fade or get better with time. GAD interferes with life functions, including relationships, work, and  school. GAD can vary from mild to severe. People with severe GAD can have intense waves of anxiety with physical symptoms (panic attacks). What are the causes? The exact cause of GAD is not known. What increases the risk? This condition is more likely to develop in:  Women.  People who have a family history of anxiety disorders.  People who are very shy.  People who experience very stressful life events, such as the death of a loved one.  People who have a very stressful family environment. What are the signs or symptoms? People with GAD often worry excessively about many things in their lives, such as their health and family. They may also be overly concerned about:  Doing well at work.  Being on time.  Natural disasters.  Friendships. Physical symptoms of GAD include:  Fatigue.  Muscle tension or having muscle twitches.  Trembling or feeling shaky.  Being easily startled.  Feeling like your heart is pounding or racing.  Feeling out of breath or like you cannot take a deep breath.  Having trouble falling asleep or staying asleep.  Sweating.  Nausea, diarrhea, or irritable bowel syndrome (IBS).  Headaches.  Trouble concentrating or remembering facts.  Restlessness.  Irritability. How is this diagnosed? Your health care provider can diagnose GAD based on your symptoms and medical history. You will also have a physical exam. The health care provider will ask specific questions about your symptoms, including how severe they are, when they started, and if they come and go. Your health care provider may ask you about your use of alcohol or drugs, including prescription medicines. Your health care provider may refer you to a mental health specialist for further evaluation. Your health care provider will do a thorough examination and may perform additional tests to rule out other possible causes of your symptoms. To be diagnosed with GAD, a person must have anxiety  that:  Is out of his or her control.  Affects several different aspects of his or her life, such as work and relationships.  Causes distress that makes him or her unable to take part in normal activities.  Includes at least three physical symptoms of GAD, such as restlessness, fatigue, trouble concentrating, irritability, muscle tension, or sleep problems. Before your health care provider can confirm a diagnosis of GAD, these symptoms must be present more days than they are not, and they must last for six months or longer. How is this treated? The following therapies are usually used to treat GAD:  Medicine. Antidepressant medicine is usually prescribed for long-term daily control. Antianxiety medicines may be added in severe cases, especially when panic attacks occur.  Talk therapy (psychotherapy). Certain types of talk therapy can be helpful in treating GAD by providing support, education,  and guidance. Options include: ? Cognitive behavioral therapy (CBT). People learn coping skills and techniques to ease their anxiety. They learn to identify unrealistic or negative thoughts and behaviors and to replace them with positive ones. ? Acceptance and commitment therapy (ACT). This treatment teaches people how to be mindful as a way to cope with unwanted thoughts and feelings. ? Biofeedback. This process trains you to manage your body's response (physiological response) through breathing techniques and relaxation methods. You will work with a therapist while machines are used to monitor your physical symptoms.  Stress management techniques. These include yoga, meditation, and exercise. A mental health specialist can help determine which treatment is best for you. Some people see improvement with one type of therapy. However, other people require a combination of therapies. Follow these instructions at home:  Take over-the-counter and prescription medicines only as told by your health care  provider.  Try to maintain a normal routine.  Try to anticipate stressful situations and allow extra time to manage them.  Practice any stress management or self-calming techniques as taught by your health care provider.  Do not punish yourself for setbacks or for not making progress.  Try to recognize your accomplishments, even if they are small.  Keep all follow-up visits as told by your health care provider. This is important. Contact a health care provider if:  Your symptoms do not get better.  Your symptoms get worse.  You have signs of depression, such as: ? A persistently sad, cranky, or irritable mood. ? Loss of enjoyment in activities that used to bring you joy. ? Change in weight or eating. ? Changes in sleeping habits. ? Avoiding friends or family members. ? Loss of energy for normal tasks. ? Feelings of guilt or worthlessness. Get help right away if:  You have serious thoughts about hurting yourself or others. If you ever feel like you may hurt yourself or others, or have thoughts about taking your own life, get help right away. You can go to your nearest emergency department or call:  Your local emergency services (911 in the [place]  A suicide crisis helpline, such as the [facility] at 340-491-0757. This is open 24 hours a day. Summary  Generalized anxiety disorder (GAD) is a mental health disorder that involves worry that is not triggered by a specific event.  People with GAD often worry excessively about many things in their lives, such as their health and family.  GAD may cause physical symptoms such as restlessness, trouble concentrating, sleep problems, frequent sweating, nausea, diarrhea, headaches, and trembling or muscle twitching.  A mental health specialist can help determine which treatment is best for you. Some people see improvement with one type of therapy. However, other people require a combination of therapies. This  information is not intended to replace advice given to you by your health care provider. Make sure you discuss any questions you have with your health care provider. Document Revised: 07/06/2017 Document Reviewed: 06/13/2016 Elsevier Patient Education  [place] Loss, Adult People experience loss in many different ways throughout their lives. Events such as moving, changing jobs, and losing friends can create a sense of loss. The loss may be as serious as a major health change, divorce, death of a pet, or death of a loved one. All of these types of loss are likely to create a physical and emotional reaction known as grief. Grief is the result of a major change or an absence of something  or someone that you count on. Grief is a normal reaction to loss. A variety of factors can affect your grieving experience, including:  The nature of your loss.  Your relationship to what or whom you lost.  Your understanding of grief and how to manage it.  Your support system. How to manage lifestyle changes Keep to your normal routine as much as possible.  If you have trouble focusing or doing normal activities, it is acceptable to take some time away from your normal routine.  Spend time with friends and loved ones.  Eat a healthy diet, get plenty of sleep, and rest when you feel tired. How to recognize changes  The way that you deal with your grief will affect your ability to function as you normally do. When grieving, you may experience these changes:  Numbness, shock, sadness, anxiety, anger, denial, and guilt.  Thoughts about death.  Unexpected crying.  A physical sensation of emptiness in your stomach.  Problems sleeping and eating.  Tiredness (fatigue).  Loss of interest in normal activities.  Dreaming about or imagining seeing the person who died.  A need to remember what or whom you lost.  Difficulty thinking about anything other than your loss for a period of  time.  Relief. If you have been expecting the loss for a while, you may feel a sense of relief when it happens. Follow these instructions at home:  Activity Express your feelings in healthy ways, such as:  Talking with others about your loss. It may be helpful to find others who have had a similar loss, such as a support group.  Writing down your feelings in a journal.  Doing physical activities to release stress and emotional energy.  Doing creative activities like painting, sculpting, or playing or listening to music.  Practicing resilience. This is the ability to recover and adjust after facing challenges. Reading some resources that encourage resilience may help you to learn ways to practice those behaviors. General instructions  Be patient with yourself and others. Allow the grieving process to happen, and remember that grieving takes time. ? It is likely that you may never feel completely done with some grief. You may find a way to move on while still cherishing memories and feelings about your loss. ? Accepting your loss is a process. It can take months or longer to adjust.  Keep all follow-up visits as told by your health care provider. This is important. Where to find support To get support for managing loss:  Ask your health care provider for help and recommendations, such as grief counseling or therapy.  Think about joining a support group for people who are managing a loss. Where to find more information You can find more information about managing loss from:  American Society of Clinical Oncology: www.cancer.net  American Psychological Association: TVStereos.ch Contact a health care provider if:  Your grief is extreme and keeps getting worse.  You have ongoing grief that does not improve.  Your body shows symptoms of grief, such as illness.  You feel depressed, anxious, or lonely. Get help right away if:  You have thoughts about hurting yourself or  others. If you ever feel like you may hurt yourself or others, or have thoughts about taking your own life, get help right away. You can go to your nearest emergency department or call:  Your local emergency services (911 in the [place]  A suicide crisis helpline, such as the [facility] at 512-314-9983. This  is open 24 hours a day. Summary  Grief is the result of a major change or an absence of someone or something that you count on. Grief is a normal reaction to loss.  The depth of grief and the period of recovery depend on the type of loss and your ability to adjust to the change and process your feelings.  Processing grief requires patience and a willingness to accept your feelings and talk about your loss with people who are supportive.  It is important to find resources that work for you and to realize that people experience grief differently. There is not one grieving process that works for everyone in the same way.  Be aware that when grief becomes extreme, it can lead to more severe issues like isolation, depression, anxiety, or suicidal thoughts. Talk with your health care provider if you have any of these issues. This information is not intended to replace advice given to you by your health care provider. Make sure you discuss any questions you have with your health care provider. Document Revised: 09/27/2018 Document Reviewed: 12/07/2016 Elsevier Patient Education  [place].

## 2020-06-14 ENCOUNTER — Telehealth: Payer: Self-pay

## 2020-06-14 LAB — URINE CULTURE

## 2020-06-14 NOTE — Telephone Encounter (Signed)
Copied from [place] [id number]. Topic: General - Inquiry >> Jun 14, 2020  2:07 PM Greggory Keen D wrote: Reason for CRM: Pt called saying she was in last Wednesday and seen [facility].  She has not heard back  From her UC.  CB#  [phone]

## 2020-06-15 NOTE — Progress Notes (Signed)
Let patient know urine culture did not result until 06/14/20 late evening. Urine culture showed below, Keflex should have covered bacteria and if she has any persisting symptoms she should return to office for follow up. Finish all antibiotic prescribed.   Urine Culture, Routine Final reportAbnormal  Organism ID, Bacteria Proteus mirabilisAbnormal   ( also a phone call message in epic it is a duplicate)

## 2020-06-15 NOTE — Telephone Encounter (Signed)
Patient was advised. KW 

## 2020-06-15 NOTE — Telephone Encounter (Signed)
Let patient know urine culture did not result until 06/14/20 late evening. Urine culture showed below, Keflex should have covered bacteria and if she has any persisting symptoms she should return to office for follow up. Finish all antibiotic prescribed.    Urine Culture, Routine Final report Abnormal   Organism ID, Bacteria Proteus mirabilis Abnormal     ( also lab message in epic it is a duplicate to this call - see urine culture resu

## 2020-06-16 ENCOUNTER — Other Ambulatory Visit: Payer: Medicare HMO

## 2020-06-16 ENCOUNTER — Ambulatory Visit: Payer: Medicare HMO | Admitting: Internal Medicine

## 2020-06-22 ENCOUNTER — Other Ambulatory Visit: Payer: Self-pay

## 2020-06-22 DIAGNOSIS — Z170 Estrogen receptor positive status [ER+]: Secondary | ICD-10-CM

## 2020-06-23 ENCOUNTER — Encounter: Payer: Self-pay | Admitting: Internal Medicine

## 2020-06-23 ENCOUNTER — Other Ambulatory Visit: Payer: Self-pay

## 2020-06-23 ENCOUNTER — Inpatient Hospital Stay: Payer: Medicare HMO | Attending: Internal Medicine

## 2020-06-23 ENCOUNTER — Inpatient Hospital Stay (HOSPITAL_BASED_OUTPATIENT_CLINIC_OR_DEPARTMENT_OTHER): Payer: Medicare HMO | Admitting: Internal Medicine

## 2020-06-23 ENCOUNTER — Ambulatory Visit
Admission: RE | Admit: 2020-06-23 | Discharge: 2020-06-23 | Disposition: A | Discharge status: Home / Self Care | Payer: Medicare HMO | Source: Ambulatory Visit | Attending: Radiation Oncology | Admitting: Radiation Oncology

## 2020-06-23 DIAGNOSIS — Z634 Disappearance and death of family member: Secondary | ICD-10-CM | POA: Diagnosis not present

## 2020-06-23 DIAGNOSIS — I10 Essential (primary) hypertension: Secondary | ICD-10-CM | POA: Diagnosis not present

## 2020-06-23 DIAGNOSIS — R42 Dizziness and giddiness: Secondary | ICD-10-CM | POA: Diagnosis not present

## 2020-06-23 DIAGNOSIS — Z808 Family history of malignant neoplasm of other organs or systems: Secondary | ICD-10-CM | POA: Insufficient documentation

## 2020-06-23 DIAGNOSIS — E119 Type 2 diabetes mellitus without complications: Secondary | ICD-10-CM | POA: Insufficient documentation

## 2020-06-23 DIAGNOSIS — R0789 Other chest pain: Secondary | ICD-10-CM | POA: Diagnosis not present

## 2020-06-23 DIAGNOSIS — G8929 Other chronic pain: Secondary | ICD-10-CM | POA: Diagnosis not present

## 2020-06-23 DIAGNOSIS — Z9049 Acquired absence of other specified parts of digestive tract: Secondary | ICD-10-CM | POA: Insufficient documentation

## 2020-06-23 DIAGNOSIS — Z79811 Long term (current) use of aromatase inhibitors: Secondary | ICD-10-CM | POA: Diagnosis not present

## 2020-06-23 DIAGNOSIS — Z800 Family history of malignant neoplasm of digestive organs: Secondary | ICD-10-CM | POA: Insufficient documentation

## 2020-06-23 DIAGNOSIS — C50211 Malignant neoplasm of upper-inner quadrant of right female breast: Secondary | ICD-10-CM | POA: Diagnosis not present

## 2020-06-23 DIAGNOSIS — Z923 Personal history of irradiation: Secondary | ICD-10-CM | POA: Insufficient documentation

## 2020-06-23 DIAGNOSIS — K219 Gastro-esophageal reflux disease without esophagitis: Secondary | ICD-10-CM | POA: Insufficient documentation

## 2020-06-23 DIAGNOSIS — Z79899 Other long term (current) drug therapy: Secondary | ICD-10-CM | POA: Insufficient documentation

## 2020-06-23 DIAGNOSIS — Z8041 Family history of malignant neoplasm of ovary: Secondary | ICD-10-CM | POA: Insufficient documentation

## 2020-06-23 DIAGNOSIS — M549 Dorsalgia, unspecified: Secondary | ICD-10-CM | POA: Insufficient documentation

## 2020-06-23 DIAGNOSIS — Z170 Estrogen receptor positive status [ER+]: Secondary | ICD-10-CM | POA: Diagnosis not present

## 2020-06-23 DIAGNOSIS — C50411 Malignant neoplasm of upper-outer quadrant of right female breast: Secondary | ICD-10-CM | POA: Diagnosis not present

## 2020-06-23 DIAGNOSIS — M2550 Pain in unspecified joint: Secondary | ICD-10-CM | POA: Diagnosis not present

## 2020-06-23 LAB — CBC WITH DIFFERENTIAL/PLATELET
Abs Immature Granulocytes: 0.02 10*3/uL (ref 0.00–0.07)
Basophils Absolute: 0.1 10*3/uL (ref 0.0–0.1)
Basophils Relative: 1 %
Eosinophils Absolute: 0.3 10*3/uL (ref 0.0–0.5)
Eosinophils Relative: 5 %
HCT: 41.8 % (ref 36.0–46.0)
Hemoglobin: 14.2 g/dL (ref 12.0–15.0)
Immature Granulocytes: 0 %
Lymphocytes Relative: 30 %
Lymphs Abs: 1.6 10*3/uL (ref 0.7–4.0)
MCH: 30.8 pg (ref 26.0–34.0)
MCHC: 34 g/dL (ref 30.0–36.0)
MCV: 90.7 fL (ref 80.0–100.0)
Monocytes Absolute: 0.3 10*3/uL (ref 0.1–1.0)
Monocytes Relative: 6 %
Neutro Abs: 3.2 10*3/uL (ref 1.7–7.7)
Neutrophils Relative %: 58 %
Platelets: 248 10*3/uL (ref 150–400)
RBC: 4.61 MIL/uL (ref 3.87–5.11)
RDW: 12.3 % (ref 11.5–15.5)
WBC: 5.4 10*3/uL (ref 4.0–10.5)
nRBC: 0 % (ref 0.0–0.2)

## 2020-06-23 LAB — COMPREHENSIVE METABOLIC PANEL
ALT: 17 U/L (ref 0–44)
AST: 19 U/L (ref 15–41)
Albumin: 4.2 g/dL (ref 3.5–5.0)
Alkaline Phosphatase: 57 U/L (ref 38–126)
Anion gap: 12 (ref 5–15)
BUN: 10 mg/dL (ref 8–23)
CO2: 27 mmol/L (ref 22–32)
Calcium: 9.7 mg/dL (ref 8.9–10.3)
Chloride: 102 mmol/L (ref 98–111)
Creatinine, Ser: 0.69 mg/dL (ref 0.44–1.00)
GFR, Estimated: >60 mL/min (ref >60)
Glucose, Bld: 200 mg/dL — ABNORMAL HIGH (ref 70–99)
Potassium: 4.2 mmol/L (ref 3.5–5.1)
Sodium: 141 mmol/L (ref 135–145)
Total Bilirubin: 0.5 mg/dL (ref 0.3–1.2)
Total Protein: 7.5 g/dL (ref 6.5–8.1)

## 2020-06-23 NOTE — Assessment & Plan Note (Addendum)
#  2017 right breast cancer stage I ER/PR positive HER-2/neu negative status post lumpectomy. No chemotherapy. Status post adjuvant radiation.  STABLE. Mammo- AUg 2021- WNL.   Currently on Femara; continue the same.  Tolerating well without any side effects.  # BMD: May 2019 normal T score=-0.3; continue calcium and vitamin D.  STABLE. Repeat prior to next visit  # Right chest wall pain secondary to T10-T12 impingement- STABLE.    # Discussed the booster vaccination/  S/p flu shot.    # DISPOSITION:  Follow up in 6 months/labs-cbc/cmp/ca-27-29; BMD prior-/MD- dr.B   

## 2020-06-23 NOTE — Progress Notes (Signed)
[place] OFFICE PROGRESS NOTE  Patient Care Team: Jerrol Banana., MD as PCP - General (Family Medicine) Bary Castilla, Forest Gleason, MD (General Surgery) Cammie Sickle, MD as Consulting Physician (Internal Medicine) Lorelee Cover., MD as Consulting Physician (Ophthalmology) Noreene Filbert, MD as Referring Physician (Radiation Oncology) Verdia Kuba, [facility] (Inactive) (Pharmacist)   SUMMARY OF HEMATOLOGIC/ONCOLOGIC HISTORY:  Oncology History Overview Note  # FEB 2017- RIGHT BREAST  STAGE I [T1b N0 M0]. ER/PR ER positive 100% PR positive 95% HER 2 FISH negative;  INVASIVE MAMMARY CARCINOMA WITH MICROPAPILLARY FEATURES. S/p Lumpec [Dr.Byrnett] & RT; No Oncotype; Femara  # 2013- LEFT BREAST- STAGE II; ER/PR/ Her 2 NEG s/p Lumpec [Dr.Byrnett] & RT  # BMD- April  2017- wnl; genetic testing- declined [May 2017]  # Vertigo [uses rolling walker]   Carcinoma of upper-outer quadrant of right breast in female, estrogen receptor positive ([place])     INTERVAL HISTORY:   A very pleasant 79 year old African-American female patient with above history of breast cancer most recently diagnosed in February 2017 is here for follow-up; patient currently on Femara.  Unfortunately patient lost her husband in December 2020.  However she is coping fairly well.  Denies any nausea vomiting headache.  Denies any bone pain.  Denies any chest pain or shortness of breath or cough.  Chronic back pain not any worse.  Review of Systems  Constitutional: Negative for chills, diaphoresis, fever, malaise/fatigue and weight loss.  HENT: Negative for nosebleeds and sore throat.   Eyes: Negative for double vision.  Respiratory: Negative for cough, hemoptysis, sputum production, shortness of breath and wheezing.   Cardiovascular: Negative for chest pain, palpitations, orthopnea and leg swelling.  Gastrointestinal: Negative for abdominal pain, blood in stool, constipation, diarrhea, heartburn,  melena, nausea and vomiting.  Genitourinary: Negative for dysuria, frequency and urgency.  Musculoskeletal: Positive for back pain and joint pain.  Skin: Negative.  Negative for itching and rash.  Neurological: Negative for dizziness, tingling, focal weakness, weakness and headaches.  Endo/Heme/Allergies: Does not bruise/bleed easily.  Psychiatric/Behavioral: Negative for depression. The patient is not nervous/anxious and does not have insomnia.      PAST MEDICAL HISTORY :  Past Medical History:  Diagnosis Date  . Breast cancer ([place]) 2013   LEFT breast with chemo and rad tx  . Breast cancer ([place]) 2017   right breast with rad tx  . Breast cancer of upper-inner quadrant of right female breast ([place]) 09/21/2015   T1bN0 " 75m; ER100%, PR 90%, HER-2/neu not overexpressed.  . Cystitis   . Diabetes mellitus without complication ([facility])    NO MEDS-DIET CONTROLLED  . GERD (gastroesophageal reflux disease)   . Hernia 2011  . Hyperlipidemia   . Hypertension   . Malignant neoplasm of upper-inner quadrant of female breast ([place] 08/17/2011   Left, T2 (2.3 cm) N0, triple negative. Chemotherapy/post wide excision whole breast radiation.  . 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 Personal history of chemotherapy 2013   LEFT BREAST  . Personal history of malignant neoplasm of breast   . Personal history of radiation therapy 2017    Rt, 2013 had on left  . Vertigo     PAST SURGICAL HISTORY :   Past Surgical History:  Procedure Laterality Date  . BREAST BIOPSY Left 07/18/2011   +  . BREAST BIOPSY Left 09/21/2015   neg  . BREAST BIOPSY Left 04/05/2016   neg  . BREAST BIOPSY  Left 03/20/2019   [place] bx 2 areas /fat necrosis at 9:30 ribbon and 5:30 coil  . BREAST EXCISIONAL BIOPSY Right 08/2015   [place]  . BREAST LUMPECTOMY Right 09/30/2015   [facility] WITH MICROPAPILLARY FEATURES AND DCIS/CLEAR MARGINS, NEGATIVE LN  . BREAST LUMPECTOMY Left 08/17/2011  . BREAST  LUMPECTOMY WITH SENTINEL LYMPH NODE BIOPSY Right 09/30/2015   Procedure: BREAST LUMPECTOMY WITH SENTINEL LYMPH NODE BX;  Surgeon: Robert Bellow, MD;  Location: [facility] ORS;  Service: General;  Laterality: Right;  . BREAST SURGERY Left 2012   wide excision  . BREAST SURGERY Left November 2013   Core biopsy of the upper-outer quadrant showed fat necrosis.  . CHOLECYSTECTOMY  2007  . COLONOSCOPY  2011   Dr Candace Cruise  . COLONOSCOPY WITH PROPOFOL N/A 02/25/2015   Procedure: COLONOSCOPY WITH PROPOFOL;  Surgeon: Hulen Luster, MD;  Location: [facility] ENDOSCOPY;  Service: Gastroenterology;  Laterality: N/A;  . DILATION AND CURETTAGE OF UTERUS  2004  . HERNIA REPAIR Right 16/05/9603   Umbilical/ventral hernia repaired with 6.4 cm Proceed ventral patch  . PORT-A-CATH REMOVAL    . PORTACATH PLACEMENT  2013  . 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  Marland Kitchen UPPER GI ENDOSCOPY  2011    FAMILY HISTORY :   Family History  Problem Relation Age of Onset  . Lymphoma Father   . Ovarian cancer Sister   . Colon cancer Brother   . Lymphoma Brother   . Cancer Other        stomach  . Cancer Other        stomach  . Breast cancer Neg Hx     SOCIAL HISTORY:   Social History   Tobacco Use  . Smoking status: Never Smoker  . Smokeless tobacco: Never Used  Vaping Use  . Vaping Use: Never used  Substance Use Topics  . Alcohol use: No  . Drug use: No    ALLERGIES:  has No Known Allergies.  MEDICATIONS:  Current Outpatient Medications  Medication Sig Dispense Refill  . Accu-Chek Softclix Lancets lancets Use as instructed 100 each 12  . acetaminophen (TYLENOL) 500 MG tablet Take 500 mg by mouth every 6 (six) hours as needed for mild pain or headache.     . Alcohol Swabs (B-D SINGLE USE SWABS REGULAR) PADS CHECK SUGAR ONCE DAILY 100 each 3  . aspirin EC 81 MG tablet Take 81 mg by mouth daily. Swallow  whole.    Marland Kitchen atorvastatin (LIPITOR) 40 MG tablet Take 1 tablet (40 mg total) by mouth every evening. 90 tablet 3  . bimatoprost (LUMIGAN) 0.01 % SOLN Place 1 drop into both eyes at bedtime.     . Blood Glucose Monitoring Suppl (ACCU-CHEK AVIVA PLUS) w/Device KIT USE AS DIRECTED 1 kit 0  . Calcium Carb-Cholecalciferol (CALCIUM 600+D3) 600-800 MG-UNIT TABS Take 1 tablet by mouth 2 (two) times daily.    . cholecalciferol (VITAMIN D) 1000 units tablet Take 1,000 Units by mouth daily.    . cyanocobalamin 500 MCG tablet Take 1,000 mcg by mouth daily.     Marland Kitchen gabapentin (NEURONTIN) 300 MG capsule TAKE 2 CAPSULES AT BEDTIME 180 capsule 3  . glucose blood (ACCU-CHEK AVIVA PLUS) test strip Check Sugar Once Daily 100 each 12  . latanoprost (XALATAN) 0.005 % ophthalmic solution Place 1 drop into both eyes at bedtime.     Marland Kitchen letrozole [facility]) 2.5  MG tablet Take 1 tablet (2.5 mg total) by mouth daily. 90 tablet 3  . losartan (COZAAR) 50 MG tablet Take 1 tablet (50 mg total) by mouth daily. 90 tablet 2  . meclizine (ANTIVERT) 25 MG tablet Take 0.5 tablets (12.5 mg total) by mouth 3 (three) times daily as needed for dizziness. 15 tablet 0  . meloxicam (MOBIC) 15 MG tablet TAKE 1 TABLET EVERY DAY 90 tablet 0  . metFORMIN (GLUCOPHAGE) 1000 MG tablet Take 1 tablet (1,000 mg total) by mouth 2 (two) times daily with a meal. 180 tablet 2  . metoprolol tartrate (LOPRESSOR) 25 MG tablet Take 0.5 tablets (12.5 mg total) by mouth 2 (two) times daily. 90 tablet 3  . Multiple Vitamin (MULTIVITAMIN WITH MINERALS) TABS tablet Take 1 tablet by mouth daily.    Marland Kitchen omega-3 acid ethyl esters (LOVAZA) 1 g capsule Take 1 g by mouth daily.     . pantoprazole (PROTONIX) 40 MG tablet Take 1 tablet (40 mg total) by mouth 2 (two) times daily. 180 tablet 3  . pyridoxine (B-6) 100 MG tablet Take 100 mg by mouth daily.    . sertraline (ZOLOFT) 50 MG tablet Take 1.5 tablets (75 mg total) by mouth daily. 140 tablet 1  . vitamin C (ASCORBIC ACID)  500 MG tablet Take 1,000 mg by mouth daily.    . vitamin E 400 UNIT capsule Take 400 Units by mouth daily.     No current facility-administered medications for this visit.    PHYSICAL EXAMINATION: ECOG PERFORMANCE STATUS: 0 - Asymptomatic  BP (!) 130/93 (BP Location: Right Arm, Patient Position: Sitting, Cuff Size: Normal)   Pulse 90   Temp (!) 96.6 F (35.9 C) (Tympanic)   Resp 16   Ht '5\' 6"'  (1.676 m)   Wt 191 lb 12.8 oz (87 kg)   SpO2 99%   BMI 30.96 kg/m   Filed Weights   06/23/20 0824  Weight: 191 lb 12.8 oz (87 kg)   Physical Exam HENT:     Head: Normocephalic and atraumatic.     Mouth/Throat:     Pharynx: No oropharyngeal exudate.  Eyes:     Pupils: Pupils are equal, round, and reactive to light.  Cardiovascular:     Rate and Rhythm: Normal rate and regular rhythm.  Pulmonary:     Effort: No respiratory distress.     Breath sounds: No wheezing.  Abdominal:     General: Bowel sounds are normal. There is no distension.     Palpations: Abdomen is soft. There is no mass.     Tenderness: There is no abdominal tenderness. There is no guarding or rebound.  Musculoskeletal:        General: No tenderness. Normal range of motion.     Cervical back: Normal range of motion and neck supple.  Skin:    General: Skin is warm.  Neurological:     Mental Status: She is alert and oriented to person, place, and time.  Psychiatric:        Mood and Affect: Affect normal.      LABORATORY DATA:  I have reviewed the data as listed    Component Value Date/Time   NA 141 06/23/2020 0741   NA 142 05/31/2020 1430   NA 140 12/23/2012 0949   K 4.2 06/23/2020 0741   K 4.7 12/23/2012 0949   CL 102 06/23/2020 0741   CL 101 12/23/2012 0949   CO2 27 06/23/2020 0741   CO2 30 12/23/2012 0949  GLUCOSE 200 (H) 06/23/2020 0741   GLUCOSE 168 (H) 12/23/2012 0949   BUN 10 06/23/2020 0741   BUN 16 05/31/2020 1430   BUN 9 12/23/2012 0949   CREATININE 0.69 06/23/2020 0741   CREATININE  0.92 09/04/2014 0916   CALCIUM 9.7 06/23/2020 0741   CALCIUM 9.8 12/23/2012 0949   PROT 7.5 06/23/2020 0741   PROT 7.4 05/31/2020 1430   PROT 7.4 09/04/2014 0916   ALBUMIN 4.2 06/23/2020 0741   ALBUMIN 4.7 05/31/2020 1430   ALBUMIN 3.7 09/04/2014 0916   AST 19 06/23/2020 0741   AST 21 09/04/2014 0916   ALT 17 06/23/2020 0741   ALT 34 09/04/2014 0916   ALKPHOS 57 06/23/2020 0741   ALKPHOS 85 09/04/2014 0916   BILITOT 0.5 06/23/2020 0741   BILITOT <0.2 05/31/2020 1430   BILITOT 0.4 09/04/2014 0916   GFRNONAA >60 06/23/2020 0741   GFRNONAA >60 09/04/2014 0916   GFRNONAA >60 02/18/2014 1048   GFRAA 83 05/31/2020 1430   GFRAA >60 09/04/2014 0916   GFRAA >60 02/18/2014 1048    No results found for: SPEP, UPEP  Lab Results  Component Value Date   WBC 5.4 06/23/2020   NEUTROABS 3.2 06/23/2020   HGB 14.2 06/23/2020   HCT 41.8 06/23/2020   MCV 90.7 06/23/2020   PLT 248 06/23/2020      Chemistry      Component Value Date/Time   NA 141 06/23/2020 0741   NA 142 05/31/2020 1430   NA 140 12/23/2012 0949   K 4.2 06/23/2020 0741   K 4.7 12/23/2012 0949   CL 102 06/23/2020 0741   CL 101 12/23/2012 0949   CO2 27 06/23/2020 0741   CO2 30 12/23/2012 0949   BUN 10 06/23/2020 0741   BUN 16 05/31/2020 1430   BUN 9 12/23/2012 0949   CREATININE 0.69 06/23/2020 0741   CREATININE 0.92 09/04/2014 0916      Component Value Date/Time   CALCIUM 9.7 06/23/2020 0741   CALCIUM 9.8 12/23/2012 0949   ALKPHOS 57 06/23/2020 0741   ALKPHOS 85 09/04/2014 0916   AST 19 06/23/2020 0741   AST 21 09/04/2014 0916   ALT 17 06/23/2020 0741   ALT 34 09/04/2014 0916   BILITOT 0.5 06/23/2020 0741   BILITOT <0.2 05/31/2020 1430   BILITOT 0.4 09/04/2014 0916       ASSESSMENT & PLAN:   Carcinoma of upper-outer quadrant of right breast in female, estrogen receptor positive ([place]) # 2017 right breast cancer stage I ER/PR positive HER-2/neu negative status post lumpectomy. No chemotherapy. Status  post adjuvant radiation.  STABLE. Mammo- AUg 2021- WNL.   Currently on Femara; continue the same.  Tolerating well without any side effects.  # BMD: May 2019 normal T score=-0.3; continue calcium and vitamin D.  STABLE. Repeat prior to next visit  # Right chest wall pain secondary to T10-T12 impingement- STABLE.    # Discussed the booster vaccination/  S/p flu shot.    # DISPOSITION:  Follow up in 6 months/labs-cbc/cmp/ca-27-29; BMD prior-/MD- dr.B        Cammie Sickle, MD 06/23/2020 8:46 AM

## 2020-06-23 NOTE — Progress Notes (Signed)
Radiation Oncology Follow up Note  Name: Leah Schaefer   Date:   06/23/2020 MRN:  [id number] DOB: 02-Apr-1941    This 79 y.o. female presents to the clinic today for 4-1/2-year follow-up status post accelerated partial breast radiation to her right breast for stage I invasive mammary carcinoma ER/PR positive.  REFERRING PROVIDER: Jerrol Banana.,*  HPI: Patient is a 79 year old female now out close to 5 years having completed accelerated partial breast irradiation to her right breast for stage I ER/PR positive invasive mammary carcinoma.  Seen today in routine follow-up she is doing well.  She specifically denies breast tenderness cough or bone pain.  She is currently on.  Femara tolerating it well without side effect.  She had a mammogram back in August which I have reviewed was BI-RADS 2 benign.  COMPLICATIONS OF TREATMENT: none  FOLLOW UP COMPLIANCE: keeps appointments   PHYSICAL EXAM:  There were no vitals taken for this visit. Lungs are clear to A&P cardiac examination essentially unremarkable with regular rate and rhythm. No dominant mass or nodularity is noted in either breast in 2 positions examined. Incision is well-healed. No axillary or supraclavicular adenopathy is appreciated. Cosmetic result is excellent.  Well-developed well-nourished patient in NAD. HEENT reveals PERLA, EOMI, discs not visualized.  Oral cavity is clear. No oral mucosal lesions are identified. Neck is clear without evidence of cervical or supraclavicular adenopathy. Lungs are clear to A&P. Cardiac examination is essentially unremarkable with regular rate and rhythm without murmur rub or thrill. Abdomen is benign with no organomegaly or masses noted. Motor sensory and DTR levels are equal and symmetric in the upper and lower extremities. Cranial nerves II through XII are grossly intact. Proprioception is intact. No peripheral adenopathy or edema is identified. No motor or sensory levels are noted. Crude  visual fields are within normal range.  RADIOLOGY RESULTS: Mammograms reviewed compatible with above-stated findings  PLAN: Present time patient is doing well with no evidence of disease close to 5 years out.  And pleased with her overall progress.  I am going to discontinue follow-up care at this point.  I be happy to reevaluate her at any time should further consultation be indicated.  She continues on Femara.Patient is to call with any concerns.  I would like to take this opportunity to thank you for allowing me to participate in the care of your patient.Noreene Filbert, MD

## 2020-06-24 LAB — CANCER ANTIGEN 27.29: CA 27.29: 14.9 U/mL (ref 0.0–38.6)

## 2020-07-06 NOTE — Progress Notes (Signed)
  I,[name],acting as a scribe for   Jr, MD.,have documented all relevant documentation on the behalf of   Jr, MD,as directed by    Jr, MD while in the presence of   Jr, MD.   Established patient visit   Patient: Leah Schaefer   DOB: 06/22/1941   79 y.o. Female  MRN: [id number] Visit Date: 07/07/2020  Today's healthcare provider:   Jr, MD   Chief Complaint  Patient presents with  . Depression  . Diabetes  . Follow-up  . Hypertension   Subjective    HPI  Patient feels well and has no complaints.  Emotionally she is feeling little bit better.  She got through Thanksgiving well despite mourning the loss of her husband earlier this year. She has been released by Dr. Crystal from radiation therapy for her breast cancer. Diabetes Mellitus Type II, follow-up  Lab Results  Component Value Date   HGBA1C 7.6 (A) 01/26/2020   HGBA1C 8.2 (A) 10/14/2019   HGBA1C 7.7 (A) 06/24/2019   Last seen for diabetes 5 months ago.  Management since then includes continuing the same treatment. She reports good compliance with treatment. She is not having side effects. none  Home blood sugar records: 119-170  Episodes of hypoglycemia? No none   Current insulin regiment: n/a Most Recent Eye Exam: due  --------------------------------------------------------------------  Hypertension, follow-up  BP Readings from Last 3 Encounters:  07/07/20 (!) 160/87  06/23/20 (!) 130/93  06/09/20 (!) 147/81   Wt Readings from Last 3 Encounters:  07/07/20 192 lb (87.1 kg)  06/23/20 191 lb 12.8 oz (87 kg)  06/09/20 183 lb 9.6 oz (83.3 kg)     She was last seen for hypertension 5 months ago.  BP at that visit was 153/79. Management since that visit includes; Follow-up in 2 months for blood pressure and A1c. She reports good compliance with treatment. She is not having side effects. none She is not exercising. She is  adherent to low salt diet.   Outside blood pressures are Not checking.  She does not smoke.  Use of agents associated with hypertension: none.   --------------------------------------------------------------------  Depression, Follow-up  She  was last seen for this 1 months ago. Changes made at last visit include; I will obtain lab work but I think the patient's symptoms are consistent with depression.  Increase sertraline from 50 to 100 mg daily.  Return to clinic 1 month.  PHQ-9 on next visit.   She reports good compliance with treatment. She is not having side effects. none  She reports good tolerance of treatment. Current symptoms include: fatigue She feels she is Worse since last visit.  Depression screen PHQ 2/9 07/07/2020 05/03/2020 01/14/2019  Decreased Interest 1 0 1  Down, Depressed, Hopeless 1 0 1  PHQ - 2 Score 2 0 2  Altered sleeping 0 0 0  Tired, decreased energy 1 0 0  Change in appetite 0 0 1  Feeling bad or failure about yourself  0 0 0  Trouble concentrating 0 1 0  Moving slowly or fidgety/restless 0 0 0  Suicidal thoughts 0 0 0  PHQ-9 Score 3 1 3  Difficult doing work/chores Not difficult at all Not difficult at all Somewhat difficult  Some recent data might be hidden    --------------------------------------------------------------------   Past Medical History:  Diagnosis Date  . Breast cancer (HCC) 2013   LEFT breast with chemo and rad tx  . Breast cancer (HCC) 2017   right   breast with rad tx  . Breast cancer of upper-inner quadrant of right female breast ([place]) 09/21/2015   T1bN0 " 91m; ER100%, PR 90%, HER-2/neu not overexpressed.  . Cystitis   . Diabetes mellitus without complication (HCC)    NO MEDS-DIET CONTROLLED  . GERD (gastroesophageal reflux disease)   . Hernia 2011  . Hyperlipidemia   . Hypertension   . [place]   Left, T2 (2.3 cm) N0, triple negative.  Chemotherapy/post wide excision whole breast radiation.  . 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 Personal history of chemotherapy 2013   LEFT BREAST  . Personal history of malignant neoplasm of breast   . Personal history of radiation therapy 2017    Rt, 2013 had on left  . Vertigo        Medications: Outpatient Medications Prior to Visit  Medication Sig  . Accu-Chek Softclix Lancets lancets Use as instructed  . acetaminophen (TYLENOL) 500 MG tablet Take 500 mg by mouth every 6 (six) hours as needed for mild pain or headache.   . Alcohol Swabs (B-D SINGLE USE SWABS REGULAR) PADS CHECK SUGAR ONCE DAILY  . aspirin EC 81 MG tablet Take 81 mg by mouth daily. Swallow whole.  .Marland Kitchenatorvastatin (LIPITOR) 40 MG tablet Take 1 tablet (40 mg total) by mouth every evening.  . bimatoprost (LUMIGAN) 0.01 % SOLN Place 1 drop into both eyes at bedtime.   . Blood Glucose Monitoring Suppl (ACCU-CHEK AVIVA PLUS) w/Device KIT USE AS DIRECTED  . Calcium Carb-Cholecalciferol (CALCIUM 600+D3) 600-800 MG-UNIT TABS Take 1 tablet by mouth 2 (two) times daily.  . cholecalciferol (VITAMIN D) 1000 units tablet Take 1,000 Units by mouth daily.  . cyanocobalamin 500 MCG tablet Take 1,000 mcg by mouth daily.   .Marland Kitchengabapentin (NEURONTIN) 300 MG capsule TAKE 2 CAPSULES AT BEDTIME  . glucose blood (ACCU-CHEK AVIVA PLUS) test strip Check Sugar Once Daily  . latanoprost (XALATAN) 0.005 % ophthalmic solution Place 1 drop into both eyes at bedtime.   .Marland Kitchenletrozole (FEMARA) 2.5 MG tablet Take 1 tablet (2.5 mg total) by mouth daily.  .Marland Kitchenlosartan (COZAAR) 50 MG tablet Take 1 tablet (50 mg total) by mouth daily.  . meclizine (ANTIVERT) 25 MG tablet Take 0.5 tablets (12.5 mg total) by mouth 3 (three) times daily as needed for dizziness.  . meloxicam (MOBIC) 15 MG tablet TAKE 1 TABLET EVERY DAY  . metFORMIN (GLUCOPHAGE) 1000 MG tablet Take 1 tablet (1,000 mg total) by mouth 2 (two) times daily with a  meal.  . metoprolol tartrate (LOPRESSOR) 25 MG tablet Take 0.5 tablets (12.5 mg total) by mouth 2 (two) times daily.  . Multiple Vitamin (MULTIVITAMIN WITH MINERALS) TABS tablet Take 1 tablet by mouth daily.  .Marland Kitchenomega-3 acid ethyl esters (LOVAZA) 1 g capsule Take 1 g by mouth daily.   . pantoprazole (PROTONIX) 40 MG tablet Take 1 tablet (40 mg total) by mouth 2 (two) times daily.  .Marland Kitchenpyridoxine (B-6) 100 MG tablet Take 100 mg by mouth daily.  . sertraline (ZOLOFT) 50 MG tablet Take 1.5 tablets (75 mg total) by mouth daily.  . vitamin C (ASCORBIC ACID) 500 MG tablet Take 1,000 mg by mouth daily.  . vitamin E 400 UNIT capsule Take 400 Units by mouth daily.   No facility-administered medications prior to visit.    Review of Systems  Constitutional: Negative for appetite change, chills, fatigue and fever.  Respiratory: Negative for chest tightness and shortness of breath.   Cardiovascular: Negative for chest pain and palpitations.  Gastrointestinal: Negative for abdominal pain, nausea and vomiting.  Neurological: Negative for dizziness and weakness.    Last hemoglobin A1c Lab Results  Component Value Date   HGBA1C 7.0 (H) 07/07/2020      Objective    BP (!) 160/87 (BP Location: Left Arm, Cuff Size: Large)   Pulse 92   Temp 98.1 F (36.7 C) (Oral)   Resp 18   Ht 5' 6" (1.676 m)   Wt 192 lb (87.1 kg)   SpO2 99%   BMI 30.99 kg/m  BP Readings from Last 3 Encounters:  07/07/20 (!) 160/87  06/23/20 (!) 130/93  06/09/20 (!) 147/81   Wt Readings from Last 3 Encounters:  07/07/20 192 lb (87.1 kg)  06/23/20 191 lb 12.8 oz (87 kg)  06/09/20 183 lb 9.6 oz (83.3 kg)      Physical Exam Vitals reviewed.  Constitutional:      Appearance: She is well-developed. She is obese.  HENT:     Head: Normocephalic and atraumatic.     Right Ear: External ear normal.     Left Ear: External ear normal.     Nose: Nose normal.  Eyes:     General: No scleral icterus. Neck:     Thyroid: No  thyromegaly.  Cardiovascular:     Rate and Rhythm: Normal rate and regular rhythm.     Heart sounds: Normal heart sounds.  Pulmonary:     Effort: Pulmonary effort is normal.     Breath sounds: Normal breath sounds.  Abdominal:     Palpations: Abdomen is soft.  Skin:    General: Skin is warm and dry.  Neurological:     General: No focal deficit present.     Mental Status: She is alert and oriented to person, place, and time.  Psychiatric:        Behavior: Behavior normal.        Thought Content: Thought content normal.        Judgment: Judgment normal.       No results found for any visits on 07/07/20.  Assessment & Plan     1. Controlled type 2 diabetes mellitus without complication, without long-term current use of insulin (HCC) Goal A1c less than 8.50 and 79 year old.  Follow-up 4 months. - Hemoglobin A1c  2. Essential (primary) hypertension Bring in home blood pressure readings on her next visit.  3. Encounter for immunization COVID booster given - Pfizer SARS-COV-2 Vaccine  4. Carcinoma of upper-outer quadrant of right breast in female, estrogen receptor positive ([place]) She has been released from radiation therapy.  She continues on Femara  5. Adjustment disorder with mixed anxiety and depressed mood Much improved.  She is appropriately grieving the loss of her husband.   Return in about 4 months (around 11/05/2020).         Savio Albrecht Cranford Mon, MD  [facility] [phone] (phone) [phone] (fax)  [place]

## 2020-07-07 ENCOUNTER — Other Ambulatory Visit: Payer: Self-pay

## 2020-07-07 ENCOUNTER — Encounter: Payer: Self-pay | Admitting: Family Medicine

## 2020-07-07 ENCOUNTER — Ambulatory Visit (INDEPENDENT_AMBULATORY_CARE_PROVIDER_SITE_OTHER): Payer: Medicare HMO | Admitting: Family Medicine

## 2020-07-07 VITALS — BP 160/87 | HR 92 | Temp 98.1°F | Resp 18 | Ht 66.0 in | Wt 192.0 lb

## 2020-07-07 DIAGNOSIS — E119 Type 2 diabetes mellitus without complications: Secondary | ICD-10-CM

## 2020-07-07 DIAGNOSIS — C50411 Malignant neoplasm of upper-outer quadrant of right female breast: Secondary | ICD-10-CM

## 2020-07-07 DIAGNOSIS — I10 Essential (primary) hypertension: Secondary | ICD-10-CM

## 2020-07-07 DIAGNOSIS — F4323 Adjustment disorder with mixed anxiety and depressed mood: Secondary | ICD-10-CM

## 2020-07-07 DIAGNOSIS — Z23 Encounter for immunization: Secondary | ICD-10-CM

## 2020-07-07 DIAGNOSIS — Z170 Estrogen receptor positive status [ER+]: Secondary | ICD-10-CM

## 2020-07-07 NOTE — Patient Instructions (Signed)
Restart Vitamin D.

## 2020-07-08 ENCOUNTER — Ambulatory Visit: Payer: Self-pay | Admitting: *Deleted

## 2020-07-08 LAB — HEMOGLOBIN A1C
Est. average glucose Bld gHb Est-mCnc: 154 mg/dL
Hgb A1c MFr Bld: 7 % — ABNORMAL HIGH (ref 4.8–5.6)

## 2020-07-08 NOTE — Telephone Encounter (Signed)
  Patient is calling to report her BP is elevated. Patient states her BP is high and she is having headache and does not feel well. Patient did get COVID booster yesterday- which could be cause of her headache/ not feeling well- but can not rule out other problem- it was elevated in office before booster yesterday. Advised ED per protocol. Reason for Disposition . [4] Systolic BP  >= 920 OR Diastolic >= 100 AND [7] cardiac or neurologic symptoms (e.g., chest pain, difficulty breathing, unsteady gait, blurred vision)  Answer Assessment - Initial Assessment Questions 1. BLOOD PRESSURE: "What is the blood pressure?" "Did you take at least two measurements 5 minutes apart?"     188/164, 167/98 P 108, 168/94, P 111 2. ONSET: "When did you take your blood pressure?"     9:30, 9:35 3. HOW: "How did you obtain the blood pressure?" (e.g., visiting nurse, automatic home BP monitor)     automatic BP cuff- arm 4. HISTORY: "Do you have a history of high blood pressure?"     yes 5. MEDICATIONS: "Are you taking any medications for blood pressure?" "Have you missed any doses recently?"     Yes- no missed doses 6. OTHER SYMPTOMS: "Do you have any symptoms?" (e.g., headache, chest pain, blurred vision, difficulty breathing, weakness)     headache 7. PREGNANCY: "Is there any chance you are pregnant?" "When was your last menstrual period?"     n/a  Protocols used: BLOOD PRESSURE - HIGH-A-AH

## 2020-07-08 NOTE — Telephone Encounter (Signed)
Follow home bp,tylenol for headache. ED if she worsens but I think she will be fine.

## 2020-07-09 ENCOUNTER — Ambulatory Visit: Payer: Self-pay | Admitting: *Deleted

## 2020-07-09 NOTE — Telephone Encounter (Signed)
Pt called in concerned that her BP is too high.  She had questions on how to take her BP medications.   Pt requested I speak with her daughter and put her on the phone.  Leah Schaefer needed clarification on how her mother was to take her BP medications and how many she was on. I pulled up her medications.   She is on losartan 50 mg once daily and metoprolol 25 mg but to take 1/2 a tablet 12.5 mg twice a day. Leah Schaefer said her mother is not taking the metoprolol the way she is supposed to.   She is going to help her mother with this and call back if they continue to have problems. I read Leah Schaefer message to them from yesterday afternoon also.  They both thanked me for my help and will call back if she continues to have a problem after taking her BP medication correctly.   I encouraged her to monitor her BP at home which she said she would.     Reason for Disposition . Caller has medicine question, adult has minor symptoms, caller declines triage, AND triager answers question  Answer Assessment - Initial Assessment Questions 1. NAME of MEDICATION: "What medicine are you calling about?"     Metoprolol and Losartan 2. QUESTION: "What is your question?" (e.g., medication refill, side effect)     See progress notes 3. PRESCRIBING HCP: "Who prescribed it?" Reason: if prescribed by specialist, call should be referred to that group.     Leah Schaefer 4. SYMPTOMS: "Do you have any symptoms?"     Headache which Leah Schaefer said to use Tylenol for. 5. SEVERITY: If symptoms are present, ask "Are they mild, moderate or severe?"     Not asked  6. PREGNANCY:  "Is there any chance that you are pregnant?" "When was your last menstrual period?"     N/A due to age  Protocols used: MEDICATION QUESTION CALL-A-AH

## 2020-07-09 NOTE — Telephone Encounter (Signed)
Patient was advised.  

## 2020-07-12 ENCOUNTER — Telehealth: Payer: Self-pay

## 2020-07-12 NOTE — Telephone Encounter (Signed)
Copied from [place] [id number]. Topic: General - Other >> Jul 12, 2020  8:32 AM Leward Quan A wrote: Reason for CRM: Patient called in to inform Dr Rosanna Randy that her BP was still elevated over the weekend and this morning 07/11/20 Rt arm 152/93  hr. 74 // Lt. 155/93 hr 83 Aft noon 147/88 hr 83  lt arm 164/96 hr 80 R 156/113 hr 82  lt arm 147/91 hr 83 07/12/20 Rt 137/91 hr. 86 Lt arm 162/99 hr 84. Please advise patient can be reached after 1 PM at Ph# [phone]

## 2020-07-12 NOTE — Telephone Encounter (Signed)
Pt is currently taking Losartan 50mg  daily and Metoprolol 12.5mg  BID. Please review. Thanks!

## 2020-07-12 NOTE — Chronic Care Management (AMB) (Unsigned)
Chronic Care Management Pharmacy  Name: Leah Schaefer  MRN: [id number] DOB: 06/26/1941  Chief Complaint/ HPI  Leah Schaefer,  79 y.o. , female presents for their Initial CCM visit with the clinical pharmacist via telephone due to COVID-19 Pandemic.  PCP : Leah Schaefer., MD  Their chronic conditions include: HTN, HLD, DM, adjustment disorder, hx breast cancer  Office Visits: 6/16 DM, [place], BP 147/81 P 75 Wt 178 BMI 28.8, A1c 7.5%, double metformin?  Consult Visit: NA  Medications: Outpatient Encounter Medications as of 07/12/2020  Medication Sig  . Accu-Chek Softclix Lancets lancets Use as instructed  . acetaminophen (TYLENOL) 500 MG tablet Take 500 mg by mouth every 6 (six) hours as needed for mild pain or headache.   . Alcohol Swabs (B-D SINGLE USE SWABS REGULAR) PADS CHECK SUGAR ONCE DAILY  . aspirin EC 81 MG tablet Take 81 mg by mouth daily. Swallow whole.  Marland Kitchen atorvastatin (LIPITOR) 40 MG tablet Take 1 tablet (40 mg total) by mouth every evening.  . bimatoprost (LUMIGAN) 0.01 % SOLN Place 1 drop into both eyes at bedtime.   . Blood Glucose Monitoring Suppl (ACCU-CHEK AVIVA PLUS) w/Device KIT USE AS DIRECTED  . Calcium Carb-Cholecalciferol (CALCIUM 600+D3) 600-800 MG-UNIT TABS Take 1 tablet by mouth 2 (two) times daily.  . cholecalciferol (VITAMIN D) 1000 units tablet Take 1,000 Units by mouth daily.  . cyanocobalamin 500 MCG tablet Take 1,000 mcg by mouth daily.   Marland Kitchen gabapentin (NEURONTIN) 300 MG capsule TAKE 2 CAPSULES AT BEDTIME  . glucose blood (ACCU-CHEK AVIVA PLUS) test strip Check Sugar Once Daily  . latanoprost (XALATAN) 0.005 % ophthalmic solution Place 1 drop into both eyes at bedtime.   Marland Kitchen letrozole (FEMARA) 2.5 MG tablet Take 1 tablet (2.5 mg total) by mouth daily.  Marland Kitchen losartan (COZAAR) 50 MG tablet Take 1 tablet (50 mg total) by mouth daily.  . meclizine (ANTIVERT) 25 MG tablet Take 0.5 tablets (12.5 mg total) by mouth 3 (three) times daily as needed  for dizziness.  . meloxicam (MOBIC) 15 MG tablet TAKE 1 TABLET EVERY DAY  . metFORMIN (GLUCOPHAGE) 1000 MG tablet Take 1 tablet (1,000 mg total) by mouth 2 (two) times daily with a meal.  . metoprolol tartrate (LOPRESSOR) 25 MG tablet Take 0.5 tablets (12.5 mg total) by mouth 2 (two) times daily.  . Multiple Vitamin (MULTIVITAMIN WITH MINERALS) TABS tablet Take 1 tablet by mouth daily.  Marland Kitchen omega-3 acid ethyl esters (LOVAZA) 1 g capsule Take 1 g by mouth daily.   . pantoprazole (PROTONIX) 40 MG tablet Take 1 tablet (40 mg total) by mouth 2 (two) times daily.  Marland Kitchen pyridoxine (B-6) 100 MG tablet Take 100 mg by mouth daily.  . sertraline (ZOLOFT) 50 MG tablet Take 1.5 tablets (75 mg total) by mouth daily.  . vitamin C (ASCORBIC ACID) 500 MG tablet Take 1,000 mg by mouth daily.  . vitamin E 400 UNIT capsule Take 400 Units by mouth daily.   No facility-administered encounter medications on file as of 07/12/2020.      Financial Resource Strain: Low Risk   . Difficulty of Paying Living Expenses: Not very hard    Current Diagnosis/Assessment:  Goals Addressed   None     Diabetes   Recent Relevant Labs: Lab Results  Component Value Date/Time   HGBA1C 7.0 (H) 07/07/2020 08:57 AM   HGBA1C 7.6 (A) 01/26/2020 04:38 PM   HGBA1C 8.2 (A) 10/14/2019 02:59 PM   HGBA1C 7.1 (H)  11/07/2018 03:17 AM   MICROALBUR 100 04/23/2017 09:31 AM   MICROALBUR Negative 03/30/2015 02:56 PM     Checking BG: Rarely  Patient is currently uncontrolled on the following medications: metformin  Last diabetic Foot exam:  Lab Results  Component Value Date/Time   HMDIABEYEEXA No Retinopathy 01/09/2018 12:00 AM    Last diabetic Eye exam:  Lab Results  Component Value Date/Time   HMDIABFOOTEX normal 03/22/2017 12:00 AM     We discussed: microalbumin ordered in March 2021 Metformin OK to double Taking full size ASA 367m  Plan  Ask PCP about labs, metformin and switch to 88maspirin Recommend increase  metformin 100026mid  Hyperlipidemia   LDL goal < 70  Lipid Panel     Component Value Date/Time   CHOL 111 01/21/2020 0908   TRIG 70 01/21/2020 0908   HDL 37 (L) 01/21/2020 0908   LDLCALC 59 01/21/2020 0908    Hepatic Function Latest Ref Rng & Units 06/23/2020 05/31/2020 01/21/2020  Total Protein 6.5 - 8.1 g/dL 7.5 7.4 7.4  Albumin 3.5 - 5.0 g/dL 4.2 4.7 4.5  AST 15 - 41 U/L '19 13 18  ' ALT 0 - 44 U/L '17 13 15  ' Alk Phosphatase 38 - 126 U/L 57 76 79  Total Bilirubin 0.3 - 1.2 mg/dL 0.5 <0.2 0.2  Bilirubin, Direct 0.1 - 0.5 mg/dL - - -     The ASCVD Risk score (GoMikey Bussing Jr., et al., 2013) failed to calculate for the following reasons:   The valid total cholesterol range is 130 to 320 mg/dL   Patient has failed these meds in past: NA Patient is currently controlled on the following medications:  . Atorvastatin 20m70mery evening  We discussed: Excellent control, meeting even the most strict guidelines that could be applied. Denies myalgias Making diet improvements  Plan  Continue current medications  Hypertension   BP goal is:  <140/90  Office blood pressures are  BP Readings from Last 3 Encounters:  07/07/20 (!) 160/87  06/23/20 (!) 130/93  06/09/20 (!) 147/81   Patient checks BP at home daily Patient home BP readings are ranging: systolic 140-500-370tient has failed these meds in the past: NA Patient is currently uncontrolled on the following medications:  . MeMarland Kitchenoprolol 12.5mg 23mce daily  We discussed: Not at goal, even at home. May benefit from ACEI introduced for renal protection under DM note  Plan  Continue current medications   Medication Management   [place] Uses pill box? Yes Pt endorses 90% compliance  We discussed: Lexapro 20mg 36my and taking Zoloft prn  Antivert twice montly Mobic only as needed  Tylenol every day 500mg t38m daily  Plan  Recommend d/c Lexapro Recommend increase Zoloft 100mg  d82m Continue current medication management strategy  Follow up: 3 month phone visit  Ted HancMilus Height, BCGP, [phone]

## 2020-07-12 NOTE — Telephone Encounter (Signed)
Patient was advised. Home Bp's running 152/103 HR 74.

## 2020-07-12 NOTE — Telephone Encounter (Signed)
- 

## 2020-07-13 ENCOUNTER — Telehealth: Payer: Self-pay

## 2020-07-13 NOTE — Telephone Encounter (Signed)
-----   Message from Jerrol Banana., MD sent at 07/08/2020 12:39 PM EST ----- Diabetes under good control

## 2020-07-13 NOTE — Telephone Encounter (Signed)
Left message advising pt.  (Per DPR)  Thanks,   -Eyvette Cordon  

## 2020-07-19 ENCOUNTER — Other Ambulatory Visit: Payer: Self-pay | Admitting: Family Medicine

## 2020-07-19 DIAGNOSIS — M545 Low back pain, unspecified: Secondary | ICD-10-CM

## 2020-07-20 ENCOUNTER — Ambulatory Visit: Payer: Self-pay | Admitting: *Deleted

## 2020-07-20 NOTE — Telephone Encounter (Signed)
Please advise 

## 2020-07-20 NOTE — Telephone Encounter (Signed)
Patient calling in with complaints of elevated BP over the past 2 weeks. Patient reports that her SBP usually runs in the 140s but over the past weeks it has been in the 160s.Patient reports that BP was 163/94 and pulse 72 in the left arm this morning and 147/97 with a pulse of 75 in the right arm. BP taken during the time of triage call was noted to be 104/85, P-100 and 1434/83,P-100. Patient denies any SOB, chest pain or blurry vision. Patient reports that she has experienced muscle pain for a while which has been discussed with the provider previously and "a fraction of a headache" that comes and goes. Patient offered a virtual visit due to symptoms of muscle pain and headache but patient requesting face to face visit. Attempted to call FC x 2 to see if provider would be willing to see the patient in office but no answer. Patient can be contacted at [phone] to be scheduled for in person office visit if possible. Patient states that a message can be left on voicemail if she is not available to answer.  Reason for Disposition . Systolic BP  >= 976 OR Diastolic >= 734  Answer Assessment - Initial Assessment Questions 1. BLOOD PRESSURE: "What is the blood pressure?" "Did you take at least two measurements 5 minutes apart?"     Left arm this morning 163/94, P-72 right arm 147/97, pulse-75 was taken this morning. BP taken at the time of triage call was 104/85, p-100 2. ONSET: "When did you take your blood pressure?"     Patient states she has experienced an elevated pressure since 07/08/20 3. HOW: "How did you obtain the blood pressure?" (e.g., visiting nurse, automatic home BP monitor)     Automatic home monitor 4. HISTORY: "Do you have a history of high blood pressure?"     yes 5. MEDICATIONS: "Are you taking any medications for blood pressure?" "Have you missed any doses recently?"    Takes medication twice a day, metoprolol and losartan. No missed doses recntly 6. OTHER SYMPTOMS: "Do you have any  symptoms?" (e.g., headache, chest pain, blurred vision, difficulty breathing, weakness)     Patient states that she had a fraction of a hedache 7. PREGNANCY: "Is there any chance you are pregnant?" "When was your last menstrual period?"     n/a  Protocols used: BLOOD PRESSURE - HIGH-A-AH

## 2020-07-21 MED ORDER — CHLORTHALIDONE 25 MG PO TABS: 25.0000 mg | ORAL_TABLET | Freq: Every day | ORAL | 1 refills | Status: DC

## 2020-07-21 NOTE — Telephone Encounter (Signed)
Start losartan 50 mg daily.

## 2020-07-21 NOTE — Addendum Note (Signed)
Addended by: Shawna Orleans on: 07/21/2020 04:00 PM   Modules accepted: Orders

## 2020-07-21 NOTE — Telephone Encounter (Signed)
Per Dr. Rosanna Randy. Disregard message below. Add chlorthalidone 25 mg qd. LMOVM for pt to return call.

## 2020-07-21 NOTE — Addendum Note (Signed)
Addended by: Julieta Bellini on: 07/21/2020 11:13 AM   Modules accepted: Orders

## 2020-07-21 NOTE — Telephone Encounter (Signed)
Patient notified of medication addition- she would like to know if she needs to take the medication in morning or night- please include in directions and send to [facility]

## 2020-07-22 ENCOUNTER — Other Ambulatory Visit: Payer: Self-pay | Admitting: Family Medicine

## 2020-07-22 MED ORDER — CHLORTHALIDONE 25 MG PO TABS
25.0000 mg | ORAL_TABLET | Freq: Every day | ORAL | 0 refills | Status: DC
Start: 2020-07-22 — End: 2020-08-04

## 2020-07-22 NOTE — Telephone Encounter (Signed)
Requested Prescriptions  Pending Prescriptions Disp Refills  . chlorthalidone (HYGROTON) 25 MG tablet 15 tablet 0    Sig: Take 1 tablet (25 mg total) by mouth daily.     Cardiovascular: Diuretics - Thiazide Failed - 07/22/2020 10:10 AM      Failed - Last BP in normal range    BP Readings from Last 1 Encounters:  07/07/20 (!) 160/87         Passed - Ca in normal range and within 360 days    Calcium  Date Value Ref Range Status  06/23/2020 9.7 8.9 - 10.3 mg/dL Final   Calcium, Total  Date Value Ref Range Status  12/23/2012 9.8 8.5 - 10.1 mg/dL Final         Passed - Cr in normal range and within 360 days    Creatinine  Date Value Ref Range Status  09/04/2014 0.92 0.60 - 1.30 mg/dL Final   Creatinine, Ser  Date Value Ref Range Status  06/23/2020 0.69 0.44 - 1.00 mg/dL Final         Passed - K in normal range and within 360 days    Potassium  Date Value Ref Range Status  06/23/2020 4.2 3.5 - 5.1 mmol/L Final  12/23/2012 4.7 3.5 - 5.1 mmol/L Final         Passed - Na in normal range and within 360 days    Sodium  Date Value Ref Range Status  06/23/2020 141 135 - 145 mmol/L Final  05/31/2020 142 134 - 144 mmol/L Final  12/23/2012 140 136 - 145 mmol/L Final         Passed - Valid encounter within last 6 months    Recent Outpatient Visits          2 weeks ago Controlled type 2 diabetes mellitus without complication, without long-term current use of insulin [facility])   [facility] Jerrol Banana., MD   1 month ago Urinary tract infection without hematuria, site unspecified   [facility] Flinchum, Kelby Aline, FNP   1 month ago [place] Jerrol Banana., MD   2 months ago Encounter for [employer] annual wellness exam   [facility] Jerrol Banana., MD   6 months ago Controlled type 2 diabetes mellitus without complication, without long-term current use of insulin [facility])   [facility] Jerrol Banana., MD      Future Appointments            In 3 months Jerrol Banana., MD [facility] [place]

## 2020-07-22 NOTE — Telephone Encounter (Signed)
Pt is calling and will wait for medication  chlorthalidone to be mail to her from [place]. Pt would like the medication to be sent to [facility] in [address] in the meantime until [place] send her medication

## 2020-07-24 ENCOUNTER — Other Ambulatory Visit: Payer: Self-pay | Admitting: Family Medicine

## 2020-07-24 DIAGNOSIS — I10 Essential (primary) hypertension: Secondary | ICD-10-CM

## 2020-07-24 NOTE — Telephone Encounter (Signed)
Requested Prescriptions  Pending Prescriptions Disp Refills   letrozole ([place]) 2.5 MG tablet [Pharmacy Med Name: LETROZOLE 2.5 MG Tablet] 90 tablet     Sig: TAKE 1 TABLET (2.5 MG TOTAL) BY MOUTH DAILY.     Off-Protocol Failed - 07/24/2020  5:43 PM      Failed - Medication not assigned to a protocol, review manually.      Passed - Valid encounter within last 12 months    Recent Outpatient Visits          2 weeks ago Controlled type 2 diabetes mellitus without complication, without long-term current use of insulin ([place])   [facility] Jerrol Banana., MD   1 month ago Urinary tract infection without hematuria, site unspecified   [facility] Flinchum, Kelby Aline, FNP   1 month ago [place] Jerrol Banana., MD   2 months ago Encounter for [employer] annual wellness exam   [facility] Jerrol Banana., MD   6 months ago Controlled type 2 diabetes mellitus without complication, without long-term current use of insulin [facility])   [facility] Jerrol Banana., MD      Future Appointments            In 3 months Jerrol Banana., MD [facility]            metoprolol tartrate (LOPRESSOR) 25 MG tablet [Pharmacy Med Name: METOPROLOL TARTRATE 25 MG Tablet] 90 tablet 3    Sig: TAKE 1/2 TABLET (12.5 MG TOTAL) TWICE DAILY     Cardiovascular:  Beta Blockers Failed - 07/24/2020  5:43 PM      Failed - Last BP in normal range    BP Readings from Last 1 Encounters:  07/07/20 (!) 160/87         Passed - Last Heart Rate in normal range    Pulse Readings from Last 1 Encounters:  07/07/20 92         Passed - Valid encounter within last 6 months    Recent Outpatient Visits          2 weeks ago Controlled type 2 diabetes mellitus without complication, without long-term current use of insulin ([place])   [facility] Jerrol Banana., MD   1  month ago Urinary tract infection without hematuria, site unspecified   [facility] Flinchum, Kelby Aline, FNP   1 month ago [place] Jerrol Banana., MD   2 months ago Encounter for [employer] annual wellness exam   [facility] Jerrol Banana., MD   6 months ago Controlled type 2 diabetes mellitus without complication, without long-term current use of insulin [facility])   [facility] Jerrol Banana., MD      Future Appointments            In 3 months Jerrol Banana., MD [facility]            atorvastatin (LIPITOR) 40 MG tablet [Pharmacy Med Name: ATORVASTATIN CALCIUM 40 MG Tablet] 90 tablet 3    Sig: TAKE 1 TABLET EVERY EVENING     Cardiovascular:  Antilipid - Statins Failed - 07/24/2020  5:43 PM      Failed - LDL in normal range and within 360 days    LDL Chol Calc (NIH)  Date Value Ref Range Status  01/21/2020 59  0 - 99 mg/dL Final         Failed - HDL in normal range and within 360 days    HDL  Date Value Ref Range Status  01/21/2020 37 (L) >39 mg/dL Final         Passed - Total Cholesterol in normal range and within 360 days    Cholesterol, Total  Date Value Ref Range Status  01/21/2020 111 100 - 199 mg/dL Final         Passed - Triglycerides in normal range and within 360 days    Triglycerides  Date Value Ref Range Status  01/21/2020 70 0 - 149 mg/dL Final         Passed - Patient is not pregnant      Passed - Valid encounter within last 12 months    Recent Outpatient Visits          2 weeks ago Controlled type 2 diabetes mellitus without complication, without long-term current use of insulin [facility])   [facility] Jerrol Banana., MD   1 month ago Urinary tract infection without hematuria, site unspecified   [facility] Flinchum, Kelby Aline, FNP   1 month ago [place] Jerrol Banana., MD   2 months ago Encounter for [employer] annual wellness exam   [facility] Jerrol Banana., MD   6 months ago Controlled type 2 diabetes mellitus without complication, without long-term current use of insulin [facility])   [facility] Jerrol Banana., MD      Future Appointments            In 3 months Jerrol Banana., MD [facility], [facility]

## 2020-07-24 NOTE — Telephone Encounter (Signed)
Requested medication (s) are due for refill today: yes  Requested medication (s) are on the active medication list: yes  Last refill:  07/30/19  Future visit scheduled: yes  Notes to clinic:  med not assigned to a protocol   Requested Prescriptions  Pending Prescriptions Disp Refills   letrozole ([place]) 2.5 MG tablet [Pharmacy Med Name: LETROZOLE 2.5 MG Tablet] 90 tablet     Sig: TAKE 1 TABLET (2.5 MG TOTAL) BY MOUTH DAILY.      Off-Protocol Failed - 07/24/2020  5:43 PM      Failed - Medication not assigned to a protocol, review manually.      Passed - Valid encounter within last 12 months    Recent Outpatient Visits           2 weeks ago Controlled type 2 diabetes mellitus without complication, without long-term current use of insulin ([place])   [facility] Jerrol Banana., MD   1 month ago Urinary tract infection without hematuria, site unspecified   [facility] Flinchum, Kelby Aline, FNP   1 month ago [place] Jerrol Banana., MD   2 months ago Encounter for [employer] annual wellness exam   [facility] Jerrol Banana., MD   6 months ago Controlled type 2 diabetes mellitus without complication, without long-term current use of insulin [facility])   [facility] Jerrol Banana., MD       Future Appointments             In 3 months Jerrol Banana., MD [facility], [facility]              Signed Prescriptions Disp Refills   metoprolol tartrate (LOPRESSOR) 25 MG tablet 90 tablet 3    Sig: TAKE 1/2 TABLET (12.5 MG TOTAL) TWICE DAILY      Cardiovascular:  Beta Blockers Failed - 07/24/2020  5:43 PM      Failed - Last BP in normal range    BP Readings from Last 1 Encounters:  07/07/20 (!) 160/87          Passed - Last Heart Rate in normal range    Pulse Readings from Last 1 Encounters:  07/07/20 92          Passed - Valid encounter within  last 6 months    Recent Outpatient Visits           2 weeks ago Controlled type 2 diabetes mellitus without complication, without long-term current use of insulin ([place])   [facility] Jerrol Banana., MD   1 month ago Urinary tract infection without hematuria, site unspecified   [facility] Flinchum, Kelby Aline, FNP   1 month ago [place] Jerrol Banana., MD   2 months ago Encounter for [employer] annual wellness exam   [facility] Jerrol Banana., MD   6 months ago Controlled type 2 diabetes mellitus without complication, without long-term current use of insulin [facility])   [facility] Jerrol Banana., MD       Future Appointments             In 3 months Jerrol Banana., MD [facility]               atorvastatin (LIPITOR) 40 MG tablet 90 tablet 3    Sig: TAKE 1 TABLET  EVERY EVENING      Cardiovascular:  Antilipid - Statins Failed - 07/24/2020  5:43 PM      Failed - LDL in normal range and within 360 days    LDL Chol Calc (NIH)  Date Value Ref Range Status  01/21/2020 59 0 - 99 mg/dL Final          Failed - HDL in normal range and within 360 days    HDL  Date Value Ref Range Status  01/21/2020 37 (L) >39 mg/dL Final          Passed - Total Cholesterol in normal range and within 360 days    Cholesterol, Total  Date Value Ref Range Status  01/21/2020 111 100 - 199 mg/dL Final          Passed - Triglycerides in normal range and within 360 days    Triglycerides  Date Value Ref Range Status  01/21/2020 70 0 - 149 mg/dL Final          Passed - Patient is not pregnant      Passed - Valid encounter within last 12 months    Recent Outpatient Visits           2 weeks ago Controlled type 2 diabetes mellitus without complication, without long-term current use of insulin ([place])   [facility] Jerrol Banana.,  MD   1 month ago Urinary tract infection without hematuria, site unspecified   [facility] Flinchum, Kelby Aline, FNP   1 month ago [place] Jerrol Banana., MD   2 months ago Encounter for [employer] annual wellness exam   [facility] Jerrol Banana., MD   6 months ago Controlled type 2 diabetes mellitus without complication, without long-term current use of insulin [facility])   [facility] Jerrol Banana., MD       Future Appointments             In 3 months Jerrol Banana., MD [facility], [facility]

## 2020-07-28 ENCOUNTER — Telehealth: Payer: Self-pay

## 2020-07-28 NOTE — Telephone Encounter (Signed)
Copied from [place] [id number]. Topic: General - Other >> Jul 28, 2020  8:27 AM Leah Schaefer D wrote: PT need speak with a nurse about her blood pressure 140/98 / no symptoms / please advise

## 2020-08-03 ENCOUNTER — Telehealth: Payer: Self-pay

## 2020-08-03 DIAGNOSIS — I10 Essential (primary) hypertension: Secondary | ICD-10-CM

## 2020-08-03 NOTE — Telephone Encounter (Signed)
Copied from CRM [id number]. Topic: General - Call Back - No Documentation >> Aug 03, 2020 12:42 PM Randol Kern wrote: [phone] Pt is requesting a call back regarding chlorthalidone (HYGROTON) 25 MG tablet

## 2020-08-04 MED ORDER — CHLORTHALIDONE 25 MG PO TABS: 25.0000 mg | ORAL_TABLET | Freq: Every day | ORAL | 0 refills | Status: DC

## 2020-08-04 NOTE — Telephone Encounter (Signed)
Patient called to request refill for chlorthalidone 25 mg qd. Patient wanted to know if she will be taking chlorthalidone for an extended period? If so she would like a 90 day supply sent to [facility]. Patient only had a couple of pills left and due to Dr. Sullivan Lone being out, sent in 30 day supply to [facility]. Patient wanted Dr. Sullivan Lone to know her Bp numbers since starting chlorthalidone are averaging around 116/74, 123/72. Please advise?

## 2020-08-04 NOTE — Telephone Encounter (Signed)
LMOVM for pt to return call 

## 2020-08-04 NOTE — Telephone Encounter (Signed)
Patient is calling April back. Please advise CB- [phone]

## 2020-08-04 NOTE — Addendum Note (Signed)
Addended by: Marlene Lard on: 08/04/2020 03:06 PM   Modules accepted: Orders

## 2020-08-05 MED ORDER — CHLORTHALIDONE 25 MG PO TABS: 25.0000 mg | ORAL_TABLET | Freq: Every day | ORAL | 4 refills | Status: DC

## 2020-08-05 NOTE — Addendum Note (Signed)
Addended by: Marlene Lard on: 08/05/2020 12:05 PM   Modules accepted: Orders

## 2020-08-05 NOTE — Telephone Encounter (Signed)
Good--rf for 90 with 4 rf.

## 2020-08-05 NOTE — Telephone Encounter (Signed)
Rx sent to pharmacy   

## 2020-08-16 ENCOUNTER — Other Ambulatory Visit: Payer: Self-pay | Admitting: Family Medicine

## 2020-08-16 DIAGNOSIS — K2970 Gastritis, unspecified, without bleeding: Secondary | ICD-10-CM

## 2020-08-17 ENCOUNTER — Telehealth: Payer: Self-pay

## 2020-08-17 NOTE — Progress Notes (Signed)
Chronic Care Management Pharmacy Assistant   Name: Leah Schaefer  MRN: [id number] DOB: [birth year]  Reason for Encounter: Disease State- Hypertension Adherence Call.  Patient Questions:  1.  Have you seen any other providers since your last visit? Yes.  07/07/2020- Leah Durie, MD (Family Medicine)   06/23/2020 Leah Schaefer , MD Oncology  06/09/2020 Leah Schaefer Family Medicine     2.  Any changes in your medicines or health? No     PCP : Leah Banana., MD  Allergies:  No Known Allergies  Medications: Outpatient Encounter Medications as of 08/17/2020  Medication Sig  . Accu-Chek Softclix Lancets lancets Use as instructed  . acetaminophen (TYLENOL) 500 MG tablet Take 500 mg by mouth every 6 (six) hours as needed for mild pain or headache.   . Alcohol Swabs (B-D SINGLE USE SWABS REGULAR) PADS CHECK SUGAR ONCE DAILY  . aspirin EC 81 MG tablet Take 81 mg by mouth daily. Swallow whole.  Marland Kitchen atorvastatin (LIPITOR) 40 MG tablet TAKE 1 TABLET EVERY EVENING  . bimatoprost (LUMIGAN) 0.01 % SOLN Place 1 drop into both eyes at bedtime.   . Blood Glucose Monitoring Suppl (ACCU-CHEK AVIVA PLUS) w/Device KIT USE AS DIRECTED  . Calcium Carb-Cholecalciferol (CALCIUM 600+D3) 600-800 MG-UNIT TABS Take 1 tablet by mouth 2 (two) times daily.  . chlorthalidone (HYGROTON) 25 MG tablet Take 1 tablet (25 mg total) by mouth daily.  . cholecalciferol (VITAMIN D) 1000 units tablet Take 1,000 Units by mouth daily.  . cyanocobalamin 500 MCG tablet Take 1,000 mcg by mouth daily.   Marland Kitchen gabapentin (NEURONTIN) 300 MG capsule TAKE 2 CAPSULES AT BEDTIME  . glucose blood (ACCU-CHEK AVIVA PLUS) test strip Check Sugar Once Daily  . latanoprost (XALATAN) 0.005 % ophthalmic solution Place 1 drop into both eyes at bedtime.   Marland Kitchen letrozole (FEMARA) 2.5 MG tablet TAKE 1 TABLET (2.5 MG TOTAL) BY MOUTH DAILY.  Marland Kitchen losartan (COZAAR) 50 MG tablet Take 1 tablet (50 mg total) by mouth daily.  .  meclizine (ANTIVERT) 25 MG tablet Take 0.5 tablets (12.5 mg total) by mouth 3 (three) times daily as needed for dizziness.  . meloxicam (MOBIC) 15 MG tablet TAKE 1 TABLET EVERY DAY  . metFORMIN (GLUCOPHAGE) 1000 MG tablet Take 1 tablet (1,000 mg total) by mouth 2 (two) times daily with a meal.  . metoprolol tartrate (LOPRESSOR) 25 MG tablet TAKE 1/2 TABLET (12.5 MG TOTAL) TWICE DAILY  . Multiple Vitamin (MULTIVITAMIN WITH MINERALS) TABS tablet Take 1 tablet by mouth daily.  Marland Kitchen omega-3 acid ethyl esters (LOVAZA) 1 g capsule Take 1 g by mouth daily.   . pantoprazole (PROTONIX) 40 MG tablet TAKE 1 TABLET TWICE DAILY  . pyridoxine (B-6) 100 MG tablet Take 100 mg by mouth daily.  . sertraline (ZOLOFT) 50 MG tablet Take 1.5 tablets (75 mg total) by mouth daily.  . vitamin C (ASCORBIC ACID) 500 MG tablet Take 1,000 mg by mouth daily.  . vitamin E 400 UNIT capsule Take 400 Units by mouth daily.   No facility-administered encounter medications on file as of 08/17/2020.    Current Diagnosis: Patient Active Problem List   Diagnosis Date Noted  . Leukocytes in urine 06/09/2020  . Grief 06/09/2020  . Episode of recurrent major depressive disorder ([place]) 06/09/2020  . Urinary symptom or sign 06/09/2020  . Left sided numbness 11/06/2018  . 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 Umbilical pain 16/55/3748  . Breast mass, left 04/07/2016  .  Controlled type 2 diabetes mellitus without complication ([place]) 17/61/6073  . Allergic rhinitis 03/30/2015  . Anxiety 03/30/2015  . Carotid arterial disease ([place]) 03/30/2015  . Diabetes mellitus, type 2 ([place]) 03/30/2015  . Essential (primary) hypertension 03/30/2015  . Hypercholesteremia 03/30/2015  . Left leg pain 03/30/2015  . LBP (low back pain) 03/30/2015  . Neuropathic pain 03/30/2015  . Burning or prickling sensation 03/30/2015  . Neuralgia neuritis, sciatic nerve 03/30/2015  . Skin cyst 11/24/2014  . Carcinoma of upper-outer quadrant of right breast in  female, estrogen receptor positive ([place]) 08/17/2011  . Allergic reaction 11/29/2009  . Adaptation reaction 04/30/2009  . Acid reflux 04/30/2009  . Stricture and stenosis of cervix 01/28/2009   Reviewed chart prior to disease state call. Spoke with patient regarding BP  Recent Office Vitals: BP Readings from Last 3 Encounters:  07/07/20 (!) 160/87  06/23/20 (!) 130/93  06/09/20 (!) 147/81   Pulse Readings from Last 3 Encounters:  07/07/20 92  06/23/20 90  06/09/20 93    Wt Readings from Last 3 Encounters:  07/07/20 192 lb (87.1 kg)  06/23/20 191 lb 12.8 oz (87 kg)  06/09/20 183 lb 9.6 oz (83.3 kg)     Kidney Function Lab Results  Component Value Date/Time   CREATININE 0.69 06/23/2020 07:41 AM   CREATININE 0.79 05/31/2020 02:30 PM   CREATININE 0.92 09/04/2014 09:16 AM   CREATININE 0.89 02/18/2014 10:48 AM   GFRNONAA >60 06/23/2020 07:41 AM   GFRNONAA >60 09/04/2014 09:16 AM   GFRNONAA >60 02/18/2014 10:48 AM   GFRAA 83 05/31/2020 02:30 PM   GFRAA >60 09/04/2014 09:16 AM   GFRAA >60 02/18/2014 10:48 AM    BMP Latest Ref Rng & Units 06/23/2020 05/31/2020 01/21/2020  Glucose 70 - 99 mg/dL 200(H) 128(H) 151(H)  BUN 8 - 23 mg/dL _0 Creatinine 0.44 - 1.00 mg/dL 0.69 0.79 0.69  BUN/Creat Ratio 12 - 28 - 20 14  Sodium 135 - 145 mmol/L 141 142 141  Potassium 3.5 - 5.1 mmol/L 4.2 4.7 4.3  Chloride 98 - 111 mmol/L 102 102 102  CO2 22 - 32 mmol/L _1 Calcium 8.9 - 10.3 mg/dL 9.7 10.6(H) 9.8   . Current antihypertensive regimen:  o Losartan 50 mg one tablet a day o Metoprolol Tartrate 25 mg 1/2 tablet twice daily.  . How often are you checking your Blood Pressure? Yes . Twice a day   . Current home BP readings: 132/81 - 08/14/2020 - left arm - 121/88 right arm- 08/14/2020  . What recent interventions/DTPs have been made by any provider to improve Blood Pressure control since last CPP Visit: Patient states she has been taking her medication as directed.  . Any  recent hospitalizations or ED visits since last visit with CPP? No    . What diet changes have been made to improve Blood Pressure Control?  o Patient states she uses sea salt - watches her salt intake  o  . What exercise is being done to improve your Blood Pressure Control?  o Patient states she is active for 30 minutes a day.  o   Adherence Review: Is the patient currently on ACE/ARB medication? Yes  Does the patient have >5 day gap between last estimated fill dates? No    Follow-Up:  Pharmacist Review - Patient states she has been taking her blood pressure two times a day , she states she has been active no exercise regimen. Patient states she has been  feeling better since her blood pressure has been lower.   Daron Offer, CPP Notified  Judithann Sheen, [facility] Clinical Pharmacist Assistant [phone]

## 2020-08-27 ENCOUNTER — Telehealth: Payer: Self-pay

## 2020-08-27 NOTE — Progress Notes (Signed)
Spoke to patient to confirmed patient telephone appointment on 08/30/2020 for CCM at 2:00 pm with [name] the Clinical pharmacist.   Patient Verbalized understanding.  [place] Pharmacist Assistant [phone]

## 2020-08-30 ENCOUNTER — Telehealth: Payer: Self-pay

## 2020-08-30 NOTE — Chronic Care Management (AMB) (Deleted)
Chronic Care Management Pharmacy  Name: Leah Schaefer  MRN: [id number] DOB: 02-08-1941  Chief Complaint/ HPI  Leah Schaefer,  80 y.o. , female presents for their Initial CCM visit with the clinical pharmacist via telephone due to COVID-19 Pandemic.  PCP : Jerrol Banana., MD  Their chronic conditions include: HTN, HLD, DM, adjustment disorder, hx breast cancer   CAD, GERD, rhinitis, anxiety, depression   Office Visits: 6/16 DM, [place], BP 147/81 P 75 Wt 178 BMI 28.8, A1c 7.5%, double metformin?  Consult Visit: NA  Medications: Outpatient Encounter Medications as of 08/30/2020  Medication Sig  . Accu-Chek Softclix Lancets lancets Use as instructed  . acetaminophen (TYLENOL) 500 MG tablet Take 500 mg by mouth every 6 (six) hours as needed for mild pain or headache.   . Alcohol Swabs (B-D SINGLE USE SWABS REGULAR) PADS CHECK SUGAR ONCE DAILY  . aspirin EC 81 MG tablet Take 81 mg by mouth daily. Swallow whole.  Marland Kitchen atorvastatin (LIPITOR) 40 MG tablet TAKE 1 TABLET EVERY EVENING  . bimatoprost (LUMIGAN) 0.01 % SOLN Place 1 drop into both eyes at bedtime.   . Blood Glucose Monitoring Suppl (ACCU-CHEK AVIVA PLUS) w/Device KIT USE AS DIRECTED  . Calcium Carb-Cholecalciferol (CALCIUM 600+D3) 600-800 MG-UNIT TABS Take 1 tablet by mouth 2 (two) times daily.  . chlorthalidone (HYGROTON) 25 MG tablet Take 1 tablet (25 mg total) by mouth daily.  . cholecalciferol (VITAMIN D) 1000 units tablet Take 1,000 Units by mouth daily.  . cyanocobalamin 500 MCG tablet Take 1,000 mcg by mouth daily.   Marland Kitchen gabapentin (NEURONTIN) 300 MG capsule TAKE 2 CAPSULES AT BEDTIME  . glucose blood (ACCU-CHEK AVIVA PLUS) test strip Check Sugar Once Daily  . latanoprost (XALATAN) 0.005 % ophthalmic solution Place 1 drop into both eyes at bedtime.   Marland Kitchen letrozole (FEMARA) 2.5 MG tablet TAKE 1 TABLET (2.5 MG TOTAL) BY MOUTH DAILY.  Marland Kitchen losartan (COZAAR) 50 MG tablet Take 1 tablet (50 mg total) by mouth daily.  .  meclizine (ANTIVERT) 25 MG tablet Take 0.5 tablets (12.5 mg total) by mouth 3 (three) times daily as needed for dizziness.  . meloxicam (MOBIC) 15 MG tablet TAKE 1 TABLET EVERY DAY  . metFORMIN (GLUCOPHAGE) 1000 MG tablet Take 1 tablet (1,000 mg total) by mouth 2 (two) times daily with a meal.  . metoprolol tartrate (LOPRESSOR) 25 MG tablet TAKE 1/2 TABLET (12.5 MG TOTAL) TWICE DAILY  . Multiple Vitamin (MULTIVITAMIN WITH MINERALS) TABS tablet Take 1 tablet by mouth daily.  Marland Kitchen omega-3 acid ethyl esters (LOVAZA) 1 g capsule Take 1 g by mouth daily.   . pantoprazole (PROTONIX) 40 MG tablet TAKE 1 TABLET TWICE DAILY  . pyridoxine (B-6) 100 MG tablet Take 100 mg by mouth daily.  . sertraline (ZOLOFT) 50 MG tablet Take 1.5 tablets (75 mg total) by mouth daily.  . vitamin C (ASCORBIC ACID) 500 MG tablet Take 1,000 mg by mouth daily.  . vitamin E 400 UNIT capsule Take 400 Units by mouth daily.   No facility-administered encounter medications on file as of 08/30/2020.    Financial Resource Strain: Low Risk   . Difficulty of Paying Living Expenses: Not very hard    Current Diagnosis/Assessment:  Goals Addressed   None     Diabetes   Recent Relevant Labs: Lab Results  Component Value Date/Time   HGBA1C 7.0 (H) 07/07/2020 08:57 AM   HGBA1C 7.6 (A) 01/26/2020 04:38 PM   HGBA1C 8.2 (A) 10/14/2019 02:59  PM   HGBA1C 7.1 (H) 11/07/2018 03:17 AM   MICROALBUR 100 04/23/2017 09:31 AM   MICROALBUR Negative 03/30/2015 02:56 PM     Checking BG: Rarely  Patient is currently uncontrolled on the following medications:  Marland Kitchen Metformin 1000 mg twice daily   Last diabetic Foot exam:  Lab Results  Component Value Date/Time   HMDIABEYEEXA No Retinopathy 01/09/2018 12:00 AM    Last diabetic Eye exam:  Lab Results  Component Value Date/Time   HMDIABFOOTEX normal 03/22/2017 12:00 AM     We discussed:  Plan  Continue Current Medications   Hyperlipidemia   LDL goal < 70  Lipid Panel      Component Value Date/Time   CHOL 111 01/21/2020 0908   TRIG 70 01/21/2020 0908   HDL 37 (L) 01/21/2020 0908   LDLCALC 59 01/21/2020 0908    Hepatic Function Latest Ref Rng & Units 06/23/2020 05/31/2020 01/21/2020  Total Protein 6.5 - 8.1 g/dL 7.5 7.4 7.4  Albumin 3.5 - 5.0 g/dL 4.2 4.7 4.5  AST 15 - 41 U/L '19 13 18  ' ALT 0 - 44 U/L '17 13 15  ' Alk Phosphatase 38 - 126 U/L 57 76 79  Total Bilirubin 0.3 - 1.2 mg/dL 0.5 <0.2 0.2  Bilirubin, Direct 0.1 - 0.5 mg/dL - - -     The ASCVD Risk score ([place]., et al., 2013) failed to calculate for the following reasons:   The valid total cholesterol range is 130 to 320 mg/dL   Patient has failed these meds in past: NA Patient is currently controlled on the following medications:  . Aspirin 81 mg daily  . Atorvastatin 73m every evening . Lovaza 1 g daily   We discussed:   Plan  Continue current medications  Hypertension   BP goal is:  <140/90  Office blood pressures are  BP Readings from Last 3 Encounters:  07/07/20 (!) 160/87  06/23/20 (!) 130/93  06/09/20 (!) 147/81   Patient checks BP at home daily Patient home BP readings are ranging: systolic 1025-427 Patient has failed these meds in the past: NA Patient is currently uncontrolled on the following medications:  . Chlorthalidone 25 mg daily  . Losartan 50 mg daily  . Metoprolol tartrate 25 mg 1/2 tablet twice daily  .   We discussed: Not at goal, even at home. May benefit from ACEI introduced for renal protection under DM note  Plan  Continue current medications   Medication Management   Pt uses HRackerbyfor all medications Uses pill box? Yes Pt endorses 90% compliance  We discussed:  Plan  Continue current medication management strategy  Follow up: 3 month phone visit  [phone]

## 2020-09-05 ENCOUNTER — Other Ambulatory Visit: Payer: Self-pay

## 2020-09-05 ENCOUNTER — Emergency Department: Payer: Medicare HMO

## 2020-09-05 ENCOUNTER — Encounter: Payer: Self-pay | Admitting: Emergency Medicine

## 2020-09-05 DIAGNOSIS — Z7982 Long term (current) use of aspirin: Secondary | ICD-10-CM | POA: Insufficient documentation

## 2020-09-05 DIAGNOSIS — I6782 Cerebral ischemia: Secondary | ICD-10-CM | POA: Diagnosis not present

## 2020-09-05 DIAGNOSIS — Z170 Estrogen receptor positive status [ER+]: Secondary | ICD-10-CM | POA: Insufficient documentation

## 2020-09-05 DIAGNOSIS — I10 Essential (primary) hypertension: Secondary | ICD-10-CM | POA: Diagnosis not present

## 2020-09-05 DIAGNOSIS — F32A Depression, unspecified: Secondary | ICD-10-CM | POA: Insufficient documentation

## 2020-09-05 DIAGNOSIS — I6389 Other cerebral infarction: Secondary | ICD-10-CM | POA: Diagnosis not present

## 2020-09-05 DIAGNOSIS — F419 Anxiety disorder, unspecified: Secondary | ICD-10-CM | POA: Insufficient documentation

## 2020-09-05 DIAGNOSIS — Z79899 Other long term (current) drug therapy: Secondary | ICD-10-CM | POA: Insufficient documentation

## 2020-09-05 DIAGNOSIS — R531 Weakness: Secondary | ICD-10-CM | POA: Diagnosis present

## 2020-09-05 DIAGNOSIS — M6281 Muscle weakness (generalized): Secondary | ICD-10-CM | POA: Insufficient documentation

## 2020-09-05 DIAGNOSIS — G459 Transient cerebral ischemic attack, unspecified: Secondary | ICD-10-CM | POA: Diagnosis not present

## 2020-09-05 DIAGNOSIS — E785 Hyperlipidemia, unspecified: Secondary | ICD-10-CM | POA: Diagnosis not present

## 2020-09-05 DIAGNOSIS — Z7984 Long term (current) use of oral hypoglycemic drugs: Secondary | ICD-10-CM | POA: Diagnosis not present

## 2020-09-05 DIAGNOSIS — C50911 Malignant neoplasm of unspecified site of right female breast: Secondary | ICD-10-CM | POA: Diagnosis not present

## 2020-09-05 DIAGNOSIS — E119 Type 2 diabetes mellitus without complications: Secondary | ICD-10-CM | POA: Insufficient documentation

## 2020-09-05 DIAGNOSIS — Z20822 Contact with and (suspected) exposure to covid-19: Secondary | ICD-10-CM | POA: Insufficient documentation

## 2020-09-05 DIAGNOSIS — K219 Gastro-esophageal reflux disease without esophagitis: Secondary | ICD-10-CM | POA: Insufficient documentation

## 2020-09-05 DIAGNOSIS — I639 Cerebral infarction, unspecified: Principal | ICD-10-CM | POA: Insufficient documentation

## 2020-09-05 DIAGNOSIS — R270 Ataxia, unspecified: Secondary | ICD-10-CM | POA: Diagnosis not present

## 2020-09-05 LAB — CBC
HCT: 40 % (ref 36.0–46.0)
Hemoglobin: 13.8 g/dL (ref 12.0–15.0)
MCH: 30.6 pg (ref 26.0–34.0)
MCHC: 34.5 g/dL (ref 30.0–36.0)
MCV: 88.7 fL (ref 80.0–100.0)
Platelets: 266 10*3/uL (ref 150–400)
RBC: 4.51 MIL/uL (ref 3.87–5.11)
RDW: 12.1 % (ref 11.5–15.5)
WBC: 7.4 10*3/uL (ref 4.0–10.5)
nRBC: 0 % (ref 0.0–0.2)

## 2020-09-05 LAB — BASIC METABOLIC PANEL
Anion gap: 14 (ref 5–15)
BUN: 18 mg/dL (ref 8–23)
CO2: 27 mmol/L (ref 22–32)
Calcium: 10.3 mg/dL (ref 8.9–10.3)
Chloride: 101 mmol/L (ref 98–111)
Creatinine, Ser: 0.78 mg/dL (ref 0.44–1.00)
GFR, Estimated: >60 mL/min (ref >60)
Glucose, Bld: 137 mg/dL — ABNORMAL HIGH (ref 70–99)
Potassium: 3.5 mmol/L (ref 3.5–5.1)
Sodium: 142 mmol/L (ref 135–145)

## 2020-09-05 NOTE — ED Triage Notes (Signed)
Pt c/o left leg ataxia (leg moves back when pt wants it to move forward), increased left arm weakness as well (pt reports hx of same) pt LKW at 1100 today  Pt denies blood thinners, afib, positive for HTN and takes to meds for same, no appreciable weakness in legs or arms att, speech clear, no vision problems

## 2020-09-06 ENCOUNTER — Encounter: Payer: Self-pay | Admitting: Internal Medicine

## 2020-09-06 ENCOUNTER — Emergency Department: Payer: Medicare HMO

## 2020-09-06 ENCOUNTER — Observation Stay: Payer: Medicare HMO

## 2020-09-06 ENCOUNTER — Observation Stay
Admission: EM | Admit: 2020-09-06 | Discharge: 2020-09-07 | Disposition: A | Discharge status: Home / Self Care | Payer: Medicare HMO | Attending: Internal Medicine | Admitting: Internal Medicine

## 2020-09-06 DIAGNOSIS — I6782 Cerebral ischemia: Secondary | ICD-10-CM | POA: Diagnosis not present

## 2020-09-06 DIAGNOSIS — E119 Type 2 diabetes mellitus without complications: Secondary | ICD-10-CM | POA: Diagnosis not present

## 2020-09-06 DIAGNOSIS — F419 Anxiety disorder, unspecified: Secondary | ICD-10-CM | POA: Diagnosis not present

## 2020-09-06 DIAGNOSIS — Z170 Estrogen receptor positive status [ER+]: Secondary | ICD-10-CM | POA: Diagnosis not present

## 2020-09-06 DIAGNOSIS — C50411 Malignant neoplasm of upper-outer quadrant of right female breast: Secondary | ICD-10-CM | POA: Diagnosis not present

## 2020-09-06 DIAGNOSIS — I10 Essential (primary) hypertension: Secondary | ICD-10-CM | POA: Diagnosis not present

## 2020-09-06 DIAGNOSIS — I6389 Other cerebral infarction: Secondary | ICD-10-CM | POA: Diagnosis not present

## 2020-09-06 DIAGNOSIS — I6789 Other cerebrovascular disease: Secondary | ICD-10-CM | POA: Diagnosis not present

## 2020-09-06 DIAGNOSIS — I639 Cerebral infarction, unspecified: Secondary | ICD-10-CM | POA: Diagnosis present

## 2020-09-06 DIAGNOSIS — R29818 Other symptoms and signs involving the nervous system: Secondary | ICD-10-CM | POA: Diagnosis not present

## 2020-09-06 DIAGNOSIS — C50911 Malignant neoplasm of unspecified site of right female breast: Secondary | ICD-10-CM | POA: Diagnosis not present

## 2020-09-06 DIAGNOSIS — G319 Degenerative disease of nervous system, unspecified: Secondary | ICD-10-CM | POA: Diagnosis not present

## 2020-09-06 HISTORY — DX: Cerebral infarction, unspecified: I63.9

## 2020-09-06 LAB — URINALYSIS, COMPLETE (UACMP) WITH MICROSCOPIC
Bilirubin Urine: NEGATIVE
Glucose, UA: NEGATIVE mg/dL
Hgb urine dipstick: NEGATIVE
Ketones, ur: NEGATIVE mg/dL
Nitrite: NEGATIVE
Protein, ur: NEGATIVE mg/dL
Specific Gravity, Urine: 1.015 (ref 1.005–1.030)
WBC, UA: >50 WBC/hpf — ABNORMAL HIGH (ref 0–5)
pH: 7 (ref 5.0–8.0)

## 2020-09-06 MED ORDER — VITAMIN B-6 50 MG PO TABS
100.0000 mg | ORAL_TABLET | Freq: Every day | ORAL | Status: DC
  Administered 2020-09-07: 100 mg via ORAL
  Filled 2020-09-06: qty 2

## 2020-09-06 MED ORDER — LOSARTAN POTASSIUM 50 MG PO TABS
50.0000 mg | ORAL_TABLET | Freq: Every day | ORAL | Status: DC
  Administered 2020-09-07: 50 mg via ORAL
  Filled 2020-09-06: qty 1

## 2020-09-06 MED ORDER — ACETAMINOPHEN 650 MG RE SUPP: 650.0000 mg | RECTAL | Status: DC | PRN

## 2020-09-06 MED ORDER — METFORMIN HCL 500 MG PO TABS: 1000.0000 mg | ORAL_TABLET | Freq: Two times a day (BID) | ORAL | Status: DC

## 2020-09-06 MED ORDER — SERTRALINE HCL 50 MG PO TABS: 100.0000 mg | ORAL_TABLET | Freq: Every day | ORAL | Status: DC

## 2020-09-06 MED ORDER — CALCIUM CARBONATE-VITAMIN D 500-200 MG-UNIT PO TABS
1.0000 | ORAL_TABLET | Freq: Two times a day (BID) | ORAL | Status: DC
  Administered 2020-09-07: 1 via ORAL
  Filled 2020-09-06: qty 1

## 2020-09-06 MED ORDER — LATANOPROST 0.005 % OP SOLN
1.0000 [drp] | Freq: Every day | OPHTHALMIC | Status: DC
  Filled 2020-09-06: qty 2.5

## 2020-09-06 MED ORDER — ADULT MULTIVITAMIN W/MINERALS CH
1.0000 | ORAL_TABLET | Freq: Every day | ORAL | Status: DC
  Administered 2020-09-07: 1 via ORAL
  Filled 2020-09-06: qty 1

## 2020-09-06 MED ORDER — ASPIRIN EC 81 MG PO TBEC
81.0000 mg | DELAYED_RELEASE_TABLET | Freq: Every day | ORAL | Status: DC
  Administered 2020-09-07: 81 mg via ORAL
  Filled 2020-09-06: qty 1

## 2020-09-06 MED ORDER — VITAMIN B-12 1000 MCG PO TABS
1000.0000 ug | ORAL_TABLET | Freq: Every day | ORAL | Status: DC
  Administered 2020-09-07: 1000 ug via ORAL
  Filled 2020-09-06: qty 1

## 2020-09-06 MED ORDER — IOHEXOL 350 MG/ML SOLN
75.0000 mL | Freq: Once | INTRAVENOUS | Status: AC | PRN
  Administered 2020-09-06: 75 mL via INTRAVENOUS
  Filled 2020-09-06: qty 75

## 2020-09-06 MED ORDER — CHLORTHALIDONE 25 MG PO TABS
25.0000 mg | ORAL_TABLET | Freq: Every day | ORAL | Status: DC
  Administered 2020-09-07: 25 mg via ORAL
  Filled 2020-09-06: qty 1

## 2020-09-06 MED ORDER — CLOPIDOGREL BISULFATE 75 MG PO TABS
300.0000 mg | ORAL_TABLET | Freq: Once | ORAL | Status: AC
  Administered 2020-09-06: 300 mg via ORAL
  Filled 2020-09-06: qty 4

## 2020-09-06 MED ORDER — STROKE: EARLY STAGES OF RECOVERY BOOK: Freq: Once | Status: AC

## 2020-09-06 MED ORDER — VITAMIN D 25 MCG (1000 UNIT) PO TABS
1000.0000 [IU] | ORAL_TABLET | Freq: Every day | ORAL | Status: DC
  Administered 2020-09-07: 1000 [IU] via ORAL
  Filled 2020-09-06: qty 1

## 2020-09-06 MED ORDER — PANTOPRAZOLE SODIUM 40 MG PO TBEC
40.0000 mg | DELAYED_RELEASE_TABLET | Freq: Two times a day (BID) | ORAL | Status: DC
Start: 2020-09-06 — End: 2020-09-07
  Administered 2020-09-07: 40 mg via ORAL
  Filled 2020-09-06: qty 1

## 2020-09-06 MED ORDER — SENNOSIDES-DOCUSATE SODIUM 8.6-50 MG PO TABS
1.0000 | ORAL_TABLET | Freq: Two times a day (BID) | ORAL | Status: DC
  Administered 2020-09-07: 1 via ORAL
  Filled 2020-09-06: qty 1

## 2020-09-06 MED ORDER — ACETAMINOPHEN 160 MG/5ML PO SOLN
650.0000 mg | ORAL | Status: DC | PRN
  Filled 2020-09-06: qty 20.3

## 2020-09-06 MED ORDER — METOPROLOL TARTRATE 25 MG PO TABS
12.5000 mg | ORAL_TABLET | Freq: Two times a day (BID) | ORAL | Status: DC
  Administered 2020-09-07: 12.5 mg via ORAL
  Filled 2020-09-06: qty 1

## 2020-09-06 MED ORDER — ACETAMINOPHEN 325 MG PO TABS
650.0000 mg | ORAL_TABLET | ORAL | Status: DC | PRN
  Administered 2020-09-07: 650 mg via ORAL
  Filled 2020-09-06: qty 2

## 2020-09-06 MED ORDER — ASCORBIC ACID 500 MG PO TABS
1000.0000 mg | ORAL_TABLET | Freq: Every day | ORAL | Status: DC
  Administered 2020-09-07: 1000 mg via ORAL
  Filled 2020-09-06: qty 2

## 2020-09-06 MED ORDER — SERTRALINE HCL 50 MG PO TABS: 75.0000 mg | ORAL_TABLET | Freq: Every day | ORAL | Status: DC

## 2020-09-06 MED ORDER — CLOPIDOGREL BISULFATE 75 MG PO TABS: 300.0000 mg | ORAL_TABLET | Freq: Once | ORAL | Status: DC

## 2020-09-06 MED ORDER — ATORVASTATIN CALCIUM 20 MG PO TABS: 40.0000 mg | ORAL_TABLET | Freq: Every evening | ORAL | Status: DC

## 2020-09-06 MED ORDER — VITAMIN E 45 MG (100 UNIT) PO CAPS
400.0000 [IU] | ORAL_CAPSULE | Freq: Every day | ORAL | Status: DC
  Administered 2020-09-07: 400 [IU] via ORAL
  Filled 2020-09-06: qty 4

## 2020-09-06 MED ORDER — CLOPIDOGREL BISULFATE 75 MG PO TABS
75.0000 mg | ORAL_TABLET | Freq: Every day | ORAL | Status: DC
  Administered 2020-09-07: 75 mg via ORAL
  Filled 2020-09-06: qty 1

## 2020-09-06 MED ORDER — SERTRALINE HCL 50 MG PO TABS
175.0000 mg | ORAL_TABLET | Freq: Every day | ORAL | Status: DC
  Administered 2020-09-07: 175 mg via ORAL
  Filled 2020-09-06: qty 4

## 2020-09-06 MED ORDER — ONDANSETRON HCL 4 MG/2ML IJ SOLN: 4.0000 mg | Freq: Four times a day (QID) | INTRAMUSCULAR | Status: DC | PRN

## 2020-09-06 MED ORDER — ASPIRIN EC 81 MG PO TBEC: 81.0000 mg | DELAYED_RELEASE_TABLET | Freq: Every day | ORAL | Status: DC

## 2020-09-06 MED ORDER — LETROZOLE 2.5 MG PO TABS
2.5000 mg | ORAL_TABLET | Freq: Every day | ORAL | Status: DC
Start: 2020-09-07 — End: 2020-09-07
  Administered 2020-09-07: 2.5 mg via ORAL
  Filled 2020-09-06: qty 1

## 2020-09-06 NOTE — ED Notes (Signed)
Family at bedside. 

## 2020-09-06 NOTE — ED Provider Notes (Signed)
[facility] Emergency Department Provider Note   ____________________________________________    I have reviewed the triage vital signs and the nursing notes.   HISTORY  Chief Complaint Weakness     HPI Leah Schaefer is a 80 y.o. female with history of diabetes, hypertension, hyperlipidemia, breast cancer in remission who presents with complaints of left-sided weakness.  This started yesterday at approximately 10 AM.  She notes that she has intermittent left-sided weakness in the past and has not thought much of it, typically it is in her arm primarily however this time it involved her left leg and she reports it was difficult to get around because her leg was "giving out on her ".  Her leg feels better now.  Her arm feels improved as well although still not back to baseline.  No headache.  No nausea or vomiting.  No change in vision.  No facial droop.  Past Medical History:  Diagnosis Date  . Breast cancer ([place]) 2013   LEFT breast with chemo and rad tx  . Breast cancer ([place]) 2017   right breast with rad tx  . 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  . Cystitis   . Diabetes mellitus without complication (HCC)    NO MEDS-DIET CONTROLLED  . GERD (gastroesophageal reflux disease)   . Hernia 2011  . Hyperlipidemia   . Hypertension   . Malignant neoplasm of upper-inner quadrant of female breast ([place] 08/17/2011   Left, T2 (2.3 cm) N0, triple negative. Chemotherapy/post wide excision whole breast radiation.  . 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 Personal history of chemotherapy 2013   LEFT BREAST  . Personal history of malignant neoplasm of breast   . Personal history of radiation therapy 2017    Rt, 2013 had on left  . Vertigo     Patient Active Problem List   Diagnosis Date Noted  . Stroke ([place] 09/06/2020   . Leukocytes in urine 06/09/2020  . Grief 06/09/2020  . Episode of recurrent major depressive disorder ([place] 06/09/2020  . Urinary symptom or sign 06/09/2020  . Left sided numbness 11/06/2018  . 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 Umbilical pain 194/02/6807 . Breast mass, left 04/07/2016  . Controlled type 2 diabetes mellitus without complication ([place] 081/05/3158 . Allergic rhinitis 03/30/2015  . Anxiety 03/30/2015  . Carotid arterial disease ([place] 03/30/2015  . Diabetes mellitus, type 2 ([place] 03/30/2015  . Essential (primary) hypertension 03/30/2015  . Hypercholesteremia 03/30/2015  . Left leg pain 03/30/2015  . LBP (low back pain) 03/30/2015  . Neuropathic pain 03/30/2015  . Burning or prickling sensation 03/30/2015  . Neuralgia neuritis, sciatic nerve 03/30/2015  . Skin cyst 11/24/2014  . Carcinoma of upper-outer quadrant of right breast in female, estrogen receptor positive (HP[place] 08/17/2011  . Allergic reaction 11/29/2009  . Adaptation reaction 04/30/2009  . Acid reflux 04/30/2009  . Stricture and stenosis of cervix 01/28/2009    Past Surgical History:  Procedure Laterality Date  . BREAST BIOPSY Left 07/18/2011   +  . BREAST BIOPSY Left 09/21/2015   neg  . BREAST BIOPSY Left 04/05/2016   neg  . BREAST BIOPSY Left 03/20/2019   uKoreabx 2 areas /fat necrosis at 9:30 ribbon and 5:30 coil  . BREAST EXCISIONAL BIOPSY Right 08/2015   [place] . BREAST LUMPECTOMY Right 09/30/2015  [facility] WITH MICROPAPILLARY FEATURES AND DCIS/CLEAR MARGINS, NEGATIVE LN  . BREAST LUMPECTOMY Left 08/17/2011  . BREAST LUMPECTOMY WITH SENTINEL LYMPH NODE BIOPSY Right 09/30/2015   Procedure: BREAST LUMPECTOMY WITH SENTINEL LYMPH NODE BX;  Surgeon:  Bellow, MD;  Location: [facility] ORS;  Service: General;  Laterality: Right;  . BREAST SURGERY Left 2012   wide excision  . BREAST SURGERY Left November 2013   Core biopsy of the upper-outer quadrant showed fat necrosis.  . CHOLECYSTECTOMY  2007  .  COLONOSCOPY  2011   Dr Candace Cruise  . COLONOSCOPY WITH PROPOFOL N/A 02/25/2015   Procedure: COLONOSCOPY WITH PROPOFOL;  Surgeon: Hulen Luster, MD;  Location[facility] ENDOSCOPY;  Service: Gastroenterology;  Laterality: N/A;  . DILATION AND CURETTAGE OF UTERUS  2004  . HERNIA REPAIR Right 62/26/3335   Umbilical/ventral hernia repaired with 6.4 cm Proceed ventral patch  . PORT-A-CATH REMOVAL    . PORTACATH PLACEMENT  2013  . 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  Marland Kitchen UPPER GI ENDOSCOPY  2011    Prior to Admission medications   Medication Sig Start Date End Date Taking? Authorizing Provider  acetaminophen (TYLENOL) 500 MG tablet Take 500 mg by mouth every 6 (six) hours as needed for mild pain or headache.    Yes [provider]  aspirin EC 81 MG tablet Take 81 mg by mouth daily. Swallow whole.   Yes [provider]  atorvastatin (LIPITOR) 40 MG tablet TAKE 1 TABLET EVERY EVENING 07/24/20  Yes Jerrol Banana., MD  bimatoprost (LUMIGAN) 0.01 % SOLN Place 1 drop into both eyes at bedtime.    Yes [provider]  Calcium Carb-Cholecalciferol 600-800 MG-UNIT TABS Take 1 tablet by mouth 2 (two) times daily.   Yes [provider]  chlorthalidone (HYGROTON) 25 MG tablet Take 1 tablet (25 mg total) by mouth daily. 08/05/20  Yes Jerrol Banana., MD  cholecalciferol (VITAMIN D) 1000 units tablet Take 1,000 Units by mouth daily.   Yes [provider]  cyanocobalamin 500 MCG tablet Take 1,000 mcg by mouth daily.    Yes [provider]  gabapentin (NEURONTIN) 300 MG capsule TAKE 2 CAPSULES AT BEDTIME 03/16/20  Yes Jerrol Banana., MD  latanoprost (XALATAN) 0.005 % ophthalmic solution Place 1 drop into both eyes at bedtime.  10/11/16  Yes [provider]  letrozole (FEMARA) 2.5 MG tablet TAKE 1 TABLET (2.5 MG TOTAL) BY MOUTH  DAILY. 07/26/20  Yes Jerrol Banana., MD  losartan (COZAAR) 50 MG tablet Take 1 tablet (50 mg total) by mouth daily. 05/03/20  Yes Jerrol Banana., MD  meclizine (ANTIVERT) 25 MG tablet Take 0.5 tablets (12.5 mg total) by mouth 3 (three) times daily as needed for dizziness. 10/27/15  Yes Hinda Kehr, MD  meloxicam (MOBIC) 15 MG tablet TAKE 1 TABLET EVERY DAY 07/19/20  Yes Jerrol Banana., MD  metFORMIN (GLUCOPHAGE) 1000 MG tablet Take 1 tablet (1,000 mg total) by mouth 2 (two) times daily with a meal. 04/07/20  Yes Jerrol Banana., MD  metoprolol tartrate (LOPRESSOR) 25 MG tablet TAKE 1/2 TABLET (12.5 MG TOTAL) TWICE DAILY 07/24/20  Yes Jerrol Banana., MD  Multiple Vitamin (MULTIVITAMIN WITH MINERALS) TABS tablet Take 1 tablet by mouth daily.   Yes [provider]  omega-3 acid ethyl esters (LOVAZA) 1 g capsule Take  1 g by mouth daily.    Yes [provider]  pantoprazole (PROTONIX) 40 MG tablet TAKE 1 TABLET TWICE DAILY 08/16/20  Yes Jerrol Banana., MD  pyridoxine (B-6) 100 MG tablet Take 100 mg by mouth daily.   Yes [provider]  sertraline (ZOLOFT) 100 MG tablet Take 100 mg by mouth daily. 08/11/20  Yes [provider]  sertraline (ZOLOFT) 50 MG tablet Take 1.5 tablets (75 mg total) by mouth daily. 06/09/20  Yes Flinchum, Kelby Aline, FNP  vitamin C (ASCORBIC ACID) 500 MG tablet Take 1,000 mg by mouth daily.   Yes [provider]  vitamin E 400 UNIT capsule Take 400 Units by mouth daily.   Yes [provider]     Allergies Patient has no known allergies.  Family History  Problem Relation Age of Onset  . Lymphoma Father   . Ovarian cancer Sister   . Colon cancer Brother   . Lymphoma Brother   . Cancer Other        stomach  . Cancer Other        stomach  . Breast cancer Neg Hx     Social History Social History   Tobacco Use  . Smoking status: Never Smoker  . Smokeless tobacco: Never  Used  Vaping Use  . Vaping Use: Never used  Substance Use Topics  . Alcohol use: No  . Drug use: No    Review of Systems  Constitutional: No fever/chills Eyes: No visual changes.  ENT: No sore throat. Cardiovascular: Denies chest pain. Respiratory: Denies shortness of breath. Gastrointestinal: No abdominal pain.  No nausea, no vomiting.   Genitourinary: Negative for dysuria. Musculoskeletal: Negative for back pain. Skin: Negative for rash. Neurological: As above   ____________________________________________   PHYSICAL EXAM:  VITAL SIGNS: ED Triage Vitals [09/05/20 2238]  Enc Vitals Group     BP (!) 155/75     Pulse Rate 79     Resp 18     Temp 98.2 F (36.8 C)     Temp Source Oral     SpO2 96 %     Weight 86.2 kg (190 lb)     Height 1.676 m ('5\' 6"' )     Head Circumference      Peak Flow      Pain Score 0     Pain Loc      Pain Edu?      Excl. in [place]?     Constitutional: Alert and oriented.  Eyes: Conjunctivae are normal.  PERRLA, EOMI Head: Atraumatic.  Mouth/Throat: Mucous membranes are moist.   Neck:  Painless ROM Cardiovascular: Normal rate, regular rhythm. Grossly normal heart sounds.  Good peripheral circulation. Respiratory: Normal respiratory effort.  No retractions. Lungs CTAB. Gastrointestinal: Soft and nontender. No distention.  No CVA tenderness. Genitourinary: deferred Musculoskeletal:  Warm and well perfused Neurologic:  Normal speech and language.  Strength appears normal objectively, patient describes some weakness in the left arm and hand, very minimal left pronator drift, NIH scale of 1 Skin:  Skin is warm, dry and intact. No rash noted. Psychiatric: Mood and affect are normal. Speech and behavior are normal.  ____________________________________________   LABS (all labs ordered are listed, but only abnormal results are displayed)  Labs Reviewed  BASIC METABOLIC PANEL - Abnormal; Notable for the following components:      Result  Value   Glucose, Bld 137 (*)    All other components within normal limits  URINALYSIS,  COMPLETE (UACMP) WITH MICROSCOPIC - Abnormal; Notable for the following components:   Color, Urine YELLOW (*)    APPearance HAZY (*)    Leukocytes,Ua LARGE (*)    WBC, UA >50 (*)    Bacteria, UA MANY (*)    All other components within normal limits  SARS CORONAVIRUS 2 (TAT 6-24 HRS)  CBC  CBG MONITORING, ED   ____________________________________________  EKG  ED ECG REPORT I, Lavonia Drafts, the attending physician, personally viewed and interpreted this ECG.  Date: 09/06/2020  Rhythm: normal sinus rhythm QRS Axis: normal Intervals: normal ST/T Wave abnormalities: Nonspecific changes Narrative Interpretation: no evidence of acute ischemia  ____________________________________________  RADIOLOGY  CT head reviewed by me, no acute abnormality, radiology notes small vessel ischemic changes ____________________________________________   PROCEDURES  Procedure(s) performed: No  Procedures   Critical Care performed: No ____________________________________________   INITIAL IMPRESSION / ASSESSMENT AND PLAN / ED COURSE  Pertinent labs & imaging results that were available during my care of the patient were reviewed by me and considered in my medical decision making (see chart for details).  Patient presents with left-sided weakness as above, symptoms have improved significantly although still complains of some unusual feelings in the left arm.  Patient is able to ambulate.  Strength is normal in the left leg.  Concerning for CVA versus TIA.  She is outside of the window for TPA  CT head is reassuring, will send for MRI  MRI is consistent with acute infarct, have discussed with Dr. Tobie Poet of the hospitalist service for admission    ____________________________________________   FINAL CLINICAL IMPRESSION(S) / ED DIAGNOSES  Final diagnoses:  Cerebrovascular accident (CVA),  unspecified mechanism ([place])        Note:  This document was prepared using Dragon voice recognition software and may include unintentional dictation errors.   Lavonia Drafts, MD 09/06/20 [phone]

## 2020-09-06 NOTE — Consult Note (Signed)
Neurology Consultation Reason for Consult: Stroke Referring Physician: Cox, a  CC: Left-sided numbness  History is obtained from: Patient  HPI: Leah Schaefer is a 80 y.o. female with history of hypertension, hyperlipidemia, diabetes who presents with left-sided numbness that began yesterday around 10 AM.  Due to the symptoms being persistent, she sought care in the emergency department today where an MRI was performed and demonstrates a small subcortical stroke on the right.  The patient states that she may be slightly better than yesterday, but she is still not back to normal.  She denies weakness.   LKW: 10 AM 1/30 tpa given?: no, outside of window    ROS: A 14 point ROS was performed and is negative except as noted in the HPI.  Past Medical History:  Diagnosis Date  . Breast cancer ([place]) 2013   LEFT breast with chemo and rad tx  . Breast cancer ([place]) 2017   right breast with rad tx  . 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  . Cystitis   . Diabetes mellitus without complication (HCC)    NO MEDS-DIET CONTROLLED  . GERD (gastroesophageal reflux disease)   . Hernia 2011  . Hyperlipidemia   . Hypertension   . Malignant neoplasm of upper-inner quadrant of female breast ([place] 08/17/2011   Left, T2 (2.3 cm) N0, triple negative. Chemotherapy/post wide excision whole breast radiation.  . 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 Personal history of chemotherapy 2013   LEFT BREAST  . Personal history of malignant neoplasm of breast   . Personal history of radiation therapy 2017    Rt, 2013 had on left  . Vertigo      Family History  Problem Relation Age of Onset  . Lymphoma Father   . Ovarian cancer Sister   . Colon cancer Brother   . Lymphoma Brother   . Cancer Other        stomach  . Cancer Other        stomach  . Breast cancer Neg Hx       Social History:  reports that she has never smoked. She has never used smokeless tobacco. She reports that she does not drink alcohol and does not use drugs.   Exam: Current vital signs: BP 125/75   Pulse 89   Temp 98.2 F (36.8 C) (Oral)   Resp 16   Ht '5\' 6"'  (1.676 m)   Wt 86.2 kg   SpO2 92%   BMI 30.67 kg/m  Vital signs in last 24 hours: Temp:  [97.9 F (36.6 C)-99 F (37.2 C)] 98.2 F (36.8 C) (01/31 1315) Pulse Rate:  [79-103] 89 (01/31 1745) Resp:  [16-20] 16 (01/31 1745) BP: (116-155)/(67-89) 125/75 (01/31 1745) SpO2:  [92 %-99 %] 92 % (01/31 1745) Weight:  [86.2 kg] 86.2 kg (01/30 2238)   Physical Exam  Constitutional: Appears well-developed and well-nourished.  Psych: Affect appropriate to situation Eyes: No scleral injection HENT: No OP obstruction MSK: no joint deformities.  Cardiovascular: Normal rate and regular rhythm.  Respiratory: Effort normal, non-labored breathing GI: Soft.  No distension. There is no tenderness.  Skin: WDI  Neuro: Mental Status: Patient is awake, alert, interactive and appropriate* Cranial Nerves: II: Visual Fields are full. Pupils are equal, round, and reactive to light.   III,IV, VI: EOMI without ptosis or diploplia.  V: Facial sensation  is symmetric to temperature VII: Facial movement is symmetric.  VIII: hearing is intact to voice X: Uvula elevates symmetrically XI: Shoulder shrug is symmetric. XII: tongue is midline without atrophy or fasciculations.  Motor: Tone is normal. Bulk is normal. 5/5 strength was present in bilateral legs, she has 4+/5 strength of the left upper extremity Sensory: Sensation is diminished throughout the left side Cerebellar: FNF intact on the right, consistent with weakness on the left      I have reviewed labs in epic and the results pertinent to this consultation are: Creatinine 0.78  I have reviewed the images obtained: MRI brain-small infarction in the subcortical white matter  on the right  Impression: 80 year old female with acute infarct, I suspect this is likely small vessel disease due to her multiple risk factors including hypertension, hyperlipidemia, diabetes.  She will need admission for secondary risk factor modification and physical therapy.  Recommendations: - HgbA1c, fasting lipid panel - cta  of the head and neck without contrast - Frequent neuro checks - Echocardiogram - Prophylactic therapy-Antiplatelet med: Aspirin - dose 78m and plavix 729mdaily after 300108moad - Risk factor modification - Telemetry monitoring - PT consult, OT consult, Speech consult    [place]NR[place] RackD [facility] [phone] 7pm- 7am, please page neurology on call as listed in [place]

## 2020-09-06 NOTE — H&P (Addendum)
History and Physical   Leah Schaefer CZY:[id number] DOB: 1941-07-08 DOA: 09/06/2020  PCP: Jerrol Banana., MD  Outpatient Specialists: Dr. Rogue Bussing, Medical Oncologist Patient coming from: home  I have personally briefly reviewed patient's old medical records in [place].  Chief Concern: Weakness and numbness of the left side of her body.  HPI: Leah Schaefer is a 80 y.o. 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   She felt weakness and mild numbness of her left upper and lower extremity. She endorses difficulty walking. She denies changes to speech.  She denies falling.  She states this started morning of 09/05/2020 at approximately 10 AM and remained persistent.  She reports she has never felt this way before.  She states that this lasted for several hours.  She presented to the emergency department.  At bedside on my evaluation she says that the symptoms are improving however is still present.  Social history: she lives by herself as she is a widow. Her daughter lives next door to her. Formerly smoked socially and quit 40 years ago. She denies etoh use and recreational drug use.   ROS: Constitutional: no weight change, no fever ENT/Mouth: no sore throat, no rhinorrhea Eyes: no eye pain, no vision changes Cardiovascular: no chest pain, no dyspnea,  no edema, no palpitations Respiratory: no cough, no sputum, no wheezing Gastrointestinal: no nausea, no vomiting, no diarrhea, no constipation Genitourinary: no urinary incontinence, no dysuria, no hematuria Musculoskeletal: no arthralgias, no myalgias Skin: no skin lesions, no pruritus, Neuro: + weakness, no loss of consciousness, no syncope Psych: no anxiety, no depression, no  decrease appetite Heme/Lymph: no bruising, no bleeding  ED Course: Discussed with ED provider, patient requiring hospitalization due to acute stroke.  Assessment/Plan  Active Problems:   Stroke ([place])   Right parietal deep and subcortical white matter 1 cm acute infarction - Patient takes daily aspirin 81 mg - CT head without contrast read as: No acute intracranial abnormality.  Unchanged chronic small vessel ischemia. - MRI of the brain without contrast read as 1 cm acute infarction - Neurology has been consulted and we appreciate further recommendations - Complete echo ordered - Per neurology, he recommends CTA of the head and neck - Fasting lipid and A1c ordered -Patient is outside the window for permissive hypertension - Frequent neuro vascular checks - heart healthy/carb modified as patient can swallow - PT, OT - Fall precaution  - Neurologist recommends aspirin 81 mg and Plavix 75 mg prophylactic dosing with Plavix 300 mg loading  Hyperlipidemia-resumed home atorvastatin 40 mg nightly  Hypertension-resumed home chlorthalidone 25 mg daily, losartan 50 mg daily, metoprolol tartrate 12.5 mg twice daily  Anxiety/depression-resumed home sertraline 175 mg daily  Non-insulin-dependent diabetes mellitus resumed home Metformin 1000 mg p.o. twice daily  Right breast cancer-ER positive, resumed home letrozole 2.5 mg p.o. daily  GERD-resumed home PPI  As needed medications: Acetaminophen, ondansetron  Chart reviewed.   DVT prophylaxis: SCDs Code Status: DNR, when asked, if her heart were to stop breathing woud she want chest compressions. She said, 'no, let me know'. If you can't breath on her own, do you want to be intubated with a machine to help breath for you? 'nope, let me go' Diet: Heart healthy/carb modified Family Communication: Leah Schaefer, [phone] updated Disposition Plan:  Pending clinical course Consults called: Neurology Admission status: Observation with  telemetry  Past Medical History:  Diagnosis Date  . Breast cancer ([place]) 2013   LEFT breast with chemo and rad tx  . Breast cancer ([place]) 2017   right breast with rad tx  . Breast cancer of upper-inner quadrant of right female breast ([place]) 09/21/2015   T1bN0 " 10m; ER100%, PR 90%, HER-2/neu not overexpressed.  . Cystitis   . Diabetes mellitus without complication ([facility])    NO MEDS-DIET CONTROLLED  . GERD (gastroesophageal reflux disease)   . Hernia 2011  . Hyperlipidemia   . Hypertension   . Malignant neoplasm of upper-inner quadrant of female breast ([facility] 08/17/2011   Left, T2 (2.3 cm) N0, triple negative. Chemotherapy/post wide excision whole breast radiation.  . 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 Personal history of chemotherapy 2013   LEFT BREAST  . Personal history of malignant neoplasm of breast   . Personal history of radiation therapy 2017    Rt, 2013 had on left  . Vertigo    Past Surgical History:  Procedure Laterality Date  . BREAST BIOPSY Left 07/18/2011   +  . BREAST BIOPSY Left 09/21/2015   neg  . BREAST BIOPSY Left 04/05/2016   neg  . BREAST BIOPSY Left 03/20/2019   uKoreabx 2 areas /fat necrosis at 9:30 ribbon and 5:30 coil  . BREAST EXCISIONAL BIOPSY Right 08/2015   [facility] . [facility]OM[facility] Right 09/30/2015   [facility]CLE[facility]ARG[facility], NEGATIVE LN  . BREAST LUMPECTOMY Left 08/17/2011  . BREAST LUMPECTOMY WITH SENTINEL LYMPH NODE BIOPSY Right 09/30/2015   Procedure: BREAST LUMPECTOMY WITH SENTINEL LYMPH NODE BX;  Surgeon: JRobert Bellow MD;  Location: [facility] ORS;  Service: General;  Laterality: Right;  . BREAST SURGERY Left 2012   wide excision  . BREAST SURGERY Left November 2013   Core biopsy of the upper-outer quadrant showed fat necrosis.  . CHOLECYSTECTOMY  2007  . COLONOSCOPY  2011   Dr OCandace Cruise . COLONOSCOPY WITH PROPOFOL N/A 02/25/2015   Procedure: COLONOSCOPY WITH PROPOFOL;  Surgeon:  PHulen Luster MD;  [facility];  Service: Gastroenterology;  Laterality: N/A;  . DILATION AND CURETTAGE OF UTERUS  2004  . HERNIA REPAIR Right 016/05/9603  Umbilical/ventral hernia repaired with 6.4 cm Proceed ventral patch  . PORT-A-CATH REMOVAL    . PORTACATH PLACEMENT  2013  . 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  .[facility] KitchenUPPER GI ENDOSCOPY  2011   Social History:  reports that she has never smoked. She has never used smokeless tobacco. She reports that she does not drink alcohol and does not use drugs.  No Known Allergies Family History  Problem Relation Age of Onset  . Lymphoma Father   . Ovarian cancer Sister   . Colon cancer Brother   . Lymphoma Brother   . Cancer Other        stomach  . Cancer Other        stomach  . Breast cancer Neg Hx    Family history: Family history reviewed and not pertinent  Prior to Admission medications   Medication Sig Start Date End Date Taking? Authorizing Provider  acetaminophen (TYLENOL) 500 MG tablet Take 500 mg by mouth every 6 (six) hours as needed for mild pain or  headache.    Yes [provider]  aspirin EC 81 MG tablet Take 81 mg by mouth daily. Swallow whole.   Yes [provider]  atorvastatin (LIPITOR) 40 MG tablet TAKE 1 TABLET EVERY EVENING 07/24/20  Yes Jerrol Banana., MD  bimatoprost (LUMIGAN) 0.01 % SOLN Place 1 drop into both eyes at bedtime.    Yes [provider]  Calcium Carb-Cholecalciferol 600-800 MG-UNIT TABS Take 1 tablet by mouth 2 (two) times daily.   Yes [provider]  chlorthalidone (HYGROTON) 25 MG tablet Take 1 tablet (25 mg total) by mouth daily. 08/05/20  Yes Jerrol Banana., MD  cholecalciferol (VITAMIN D) 1000 units tablet Take 1,000 Units by mouth daily.   Yes [provider]  cyanocobalamin 500 MCG tablet Take 1,000 mcg by  mouth daily.    Yes [provider]  gabapentin (NEURONTIN) 300 MG capsule TAKE 2 CAPSULES AT BEDTIME 03/16/20  Yes Jerrol Banana., MD  latanoprost (XALATAN) 0.005 % ophthalmic solution Place 1 drop into both eyes at bedtime.  10/11/16  Yes [provider]  letrozole (FEMARA) 2.5 MG tablet TAKE 1 TABLET (2.5 MG TOTAL) BY MOUTH DAILY. 07/26/20  Yes Jerrol Banana., MD  losartan (COZAAR) 50 MG tablet Take 1 tablet (50 mg total) by mouth daily. 05/03/20  Yes Jerrol Banana., MD  meclizine (ANTIVERT) 25 MG tablet Take 0.5 tablets (12.5 mg total) by mouth 3 (three) times daily as needed for dizziness. 10/27/15  Yes Hinda Kehr, MD  meloxicam (MOBIC) 15 MG tablet TAKE 1 TABLET EVERY DAY 07/19/20  Yes Jerrol Banana., MD  metFORMIN (GLUCOPHAGE) 1000 MG tablet Take 1 tablet (1,000 mg total) by mouth 2 (two) times daily with a meal. 04/07/20  Yes Jerrol Banana., MD  metoprolol tartrate (LOPRESSOR) 25 MG tablet TAKE 1/2 TABLET (12.5 MG TOTAL) TWICE DAILY 07/24/20  Yes Jerrol Banana., MD  Multiple Vitamin (MULTIVITAMIN WITH MINERALS) TABS tablet Take 1 tablet by mouth daily.   Yes [provider]  omega-3 acid ethyl esters (LOVAZA) 1 g capsule Take 1 g by mouth daily.    Yes [provider]  pantoprazole (PROTONIX) 40 MG tablet TAKE 1 TABLET TWICE DAILY 08/16/20  Yes Jerrol Banana., MD  pyridoxine (B-6) 100 MG tablet Take 100 mg by mouth daily.   Yes [provider]  sertraline (ZOLOFT) 100 MG tablet Take 100 mg by mouth daily. 08/11/20  Yes [provider]  sertraline (ZOLOFT) 50 MG tablet Take 1.5 tablets (75 mg total) by mouth daily. 06/09/20  Yes Flinchum, Kelby Aline, FNP  vitamin C (ASCORBIC ACID) 500 MG tablet Take 1,000 mg by mouth daily.   Yes [provider]  vitamin E 400 UNIT capsule Take 400 Units by mouth daily.   Yes [provider]   Physical Exam: Vitals:   09/06/20 3557  09/06/20 0742 09/06/20 1030 09/06/20 1315  BP: 135/76 127/71 116/70 137/89  Pulse: (!) 103 96 96 97  Resp: '18 18  16  ' Temp: 97.9 F (36.6 C) 98.2 F (36.8 C) 99 F (37.2 C) 98.2 F (36.8 C)  TempSrc: Oral Oral Oral Oral  SpO2: 98% 98% 99% 93%  Weight:      Height:       Constitutional: appears age appropriate, NAD, calm, comfortable Eyes: PERRL, lids and conjunctivae normal ENMT: Mucous membranes are moist. Posterior pharynx clear of any exudate or lesions. Age-appropriate dentition.  Hearing appropriate Neck: normal, supple, no masses, no thyromegaly Respiratory: clear to auscultation bilaterally, no wheezing, no crackles. Normal respiratory effort. No accessory muscle use.  Cardiovascular: Regular rate and rhythm, no murmurs / rubs / gallops. No extremity edema. 2+ pedal pulses. No carotid bruits.  Abdomen: no tenderness, no masses palpated, no hepatosplenomegaly. Bowel sounds positive.  Musculoskeletal: no clubbing / cyanosis. No joint deformity upper and lower extremities. Good ROM, no contractures, no atrophy. Normal muscle tone.  Skin: no rashes, lesions, ulcers. No induration Neurologic: Sensation intact. Strength 5/5 in all 4.  Psychiatric: Normal judgment and insight. Alert and oriented x 3. Normal mood.   EKG: independently reviewed, showing normal sinus rhythm with rate of 76, QTc 443  Chest x-ray on Admission: I personally reviewed and I agree with radiologist reading as below.  CT HEAD WO CONTRAST  Result Date: 09/05/2020 CLINICAL DATA:  Transient ischemic attack (TIA) Patient reports left leg ataxia and increased left arm weakness. EXAM: CT HEAD WITHOUT CONTRAST TECHNIQUE: Contiguous axial images were obtained from the base of the skull through the vertex without intravenous contrast. COMPARISON:  Head CT and brain MRI April 2020 FINDINGS: Brain: No acute hemorrhage. No evidence of acute ischemia. Symmetric bilateral basal gangliar mineralization is unchanged from prior  exam. Normal for age atrophy. There is moderate periventricular and deep white matter hypodensity consistent with chronic small vessel ischemia. No hydrocephalus, mass effect or midline shift. No subdural or extra-axial collection. Vascular: Atherosclerosis of skullbase vasculature without hyperdense vessel or abnormal calcification. Skull: No fracture or focal lesion. Sinuses/Orbits: Paranasal sinuses and mastoid air cells are clear. The visualized orbits are unremarkable. Other: None. IMPRESSION: 1. No acute intracranial abnormality. 2. Unchanged chronic small vessel ischemia. Electronically Signed   By: Keith Rake M.D.   On: 09/05/2020 23:15   MR BRAIN WO CONTRAST  Result Date: 09/06/2020 CLINICAL DATA:  Left leg ataxia.  Left arm weakness. EXAM: MRI HEAD WITHOUT CONTRAST TECHNIQUE: Multiplanar, multiecho pulse sequences of the brain and surrounding structures were obtained without intravenous contrast. COMPARISON:  Head CT yesterday. FINDINGS: Brain: Diffusion imaging shows a 1 cm acute infarction in the right parietal deep and subcortical white matter. Minimal overlying cortical involvement. No other acute infarction. Chronic small-vessel ischemic changes affect the pons. No focal cerebellar finding. Cerebral hemispheres show moderate chronic small-vessel ischemic changes of the white matter, thalami and basal ganglia. No large vessel territory infarction. No mass, hemorrhage, hydrocephalus or extra-axial collection. Vascular: Major vessels at the base of the brain show flow. Skull and upper cervical spine: Negative Sinuses/Orbits: Clear/normal Other: None IMPRESSION: 1. 1 cm acute infarction in the right parietal deep and subcortical white matter. Minimal overlying cortical involvement. No hemorrhage or mass effect. 2. Moderate chronic small-vessel ischemic changes elsewhere throughout the brain. Electronically Signed   By: Nelson Chimes M.D.   On: 09/06/2020 12:49   Labs on Admission: I have  personally reviewed following labs  CBC: Recent Labs  Lab 09/05/20 2246  WBC 7.4  HGB 13.8  HCT 40.0  MCV 88.7  PLT 423   Basic Metabolic Panel: Recent Labs  Lab 09/05/20 2246  NA 142  K 3.5  CL 101  CO2 27  GLUCOSE 137*  BUN 18  CREATININE 0.78  CALCIUM 10.3   Urine analysis:    Component Value Date/Time   COLORURINE YELLOW (A) 09/06/2020 0223   APPEARANCEUR HAZY (A) 09/06/2020 0223   APPEARANCEUR Clear 03/08/2012 2240   LABSPEC 1.015 09/06/2020 0223   LABSPEC 1.012  03/08/2012 2240   PHURINE 7.0 09/06/2020 0223   GLUCOSEU NEGATIVE 09/06/2020 0223   GLUCOSEU Negative 03/08/2012 2240   HGBUR NEGATIVE 09/06/2020 0223   BILIRUBINUR NEGATIVE 09/06/2020 0223   BILIRUBINUR negative 06/09/2020 1352   BILIRUBINUR Negative 03/08/2012 2240   KETONESUR NEGATIVE 09/06/2020 0223   PROTEINUR NEGATIVE 09/06/2020 0223   UROBILINOGEN 0.2 06/09/2020 1352   NITRITE NEGATIVE 09/06/2020 0223   LEUKOCYTESUR LARGE (A) 09/06/2020 0223   LEUKOCYTESUR 1+ 03/08/2012 2240   Inioluwa Boulay N Makenzee Choudhry D.O. [facility]  If 7PM-7AM, please contact overnight-coverage provider If 7AM-7PM, please contact day coverage provider [email or web address]  09/06/2020, 2:21 PM

## 2020-09-06 NOTE — ED Notes (Signed)
Pt given cup of coffee, crackers, applesauce per request. Attempted to call MRI for ETA, unable to reach  Per pt request, pharm tech called for med rec so pt can get her home AM meds. EDP agreeable to giving AM home meds as normal

## 2020-09-06 NOTE — ED Notes (Signed)
Pt given phone to call family and water per request

## 2020-09-06 NOTE — ED Notes (Addendum)
Pt provided with sandwich tray and drink.  

## 2020-09-07 DIAGNOSIS — I639 Cerebral infarction, unspecified: Secondary | ICD-10-CM | POA: Diagnosis not present

## 2020-09-07 DIAGNOSIS — E7849 Other hyperlipidemia: Secondary | ICD-10-CM

## 2020-09-07 DIAGNOSIS — I10 Essential (primary) hypertension: Secondary | ICD-10-CM | POA: Diagnosis not present

## 2020-09-07 LAB — LIPID PANEL
Cholesterol: 105 mg/dL (ref 0–200)
HDL: 30 mg/dL — ABNORMAL LOW (ref >40)
LDL Cholesterol: 57 mg/dL (ref 0–99)
Total CHOL/HDL Ratio: 3.5 RATIO
Triglycerides: 89 mg/dL (ref <150)
VLDL: 18 mg/dL (ref 0–40)

## 2020-09-07 LAB — HEMOGLOBIN A1C
Hgb A1c MFr Bld: 7.6 % — ABNORMAL HIGH (ref 4.8–5.6)
Mean Plasma Glucose: 171.42 mg/dL

## 2020-09-07 LAB — SARS CORONAVIRUS 2 (TAT 6-24 HRS): SARS Coronavirus 2: NEGATIVE

## 2020-09-07 MED ORDER — SERTRALINE HCL 50 MG PO TABS: 50.0000 mg | ORAL_TABLET | Freq: Every day | ORAL | Status: DC

## 2020-09-07 MED ORDER — INSULIN ASPART 100 UNIT/ML ~~LOC~~ SOLN: 0.0000 [IU] | Freq: Three times a day (TID) | SUBCUTANEOUS | Status: DC

## 2020-09-07 MED ORDER — ASPIRIN EC 81 MG PO TBEC: 81.0000 mg | DELAYED_RELEASE_TABLET | Freq: Every day | ORAL | 11 refills | Status: DC

## 2020-09-07 MED ORDER — CLOPIDOGREL BISULFATE 75 MG PO TABS: 75.0000 mg | ORAL_TABLET | Freq: Every day | ORAL | 0 refills | Status: DC

## 2020-09-07 NOTE — Discharge Summary (Signed)
Physician Discharge Summary  Leah Schaefer [id number] DOB: 1941-01-29 DOA: 09/06/2020  PCP: Jerrol Banana., MD  Admit date: 09/06/2020 Discharge date: 09/07/2020  Admitted From: home Disposition:  home  Recommendations for Outpatient Follow-up:  1. Follow up with PCP in 1 week 2. F/u w/ neuro in 1 week   Home Health: no  Equipment/Devices:  Discharge Condition: stable CODE STATUS: full  Diet recommendation: Heart Healthy / Carb Modified  Brief/Interim Summary: HPI was taken from Dr. Tobie Poet: Leah Schaefer is a 80 y.o. 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   She felt weakness and mild numbness of her left upper and lower extremity. She endorses difficulty walking. She denies changes to speech.  She denies falling.  She states this started morning of 09/05/2020 at approximately 10 AM and remained persistent.  She reports she has never felt this way before.  She states that this lasted for several hours.  She presented to the emergency department.  At bedside on my evaluation she says that the symptoms are improving however is still present.  Social history: she lives by herself as she is a widow. Her daughter lives next door to her. Formerly smoked socially and quit 40 years ago. She denies etoh use and recreational drug use.   Hospital course from Dr. Jimmye Norman 09/07/20: Pt was found to have a CVA of unknown etiology. OT evaluated the pt and recommends no f/u. PT recs outpatient PT. Pt was started on plavix and pt was continued on her home dose of aspirin. The pt will continue as aspirin & plavix x 3 weeks and then will just continue plavix after that as per neuro. Echo was ordered but was not done prior to d/c but pt can do it as an  outpatient & will need f/u outpatient w/ neuro as well within 1 week. There is a low suspicion for cardioembolic CVA. For more information, please see previous progress/consult notes.   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 Echo ordered. Continue w/ neuro checks. Continue on aspirin, plavix. Neuro consulted  HLD: continue on statin   HTN: continue on home dose of chlorthalidone, losartan & metoprolol   Depression: severity unknown. Continue on home dose of sertraline   DM2: likely poorly controlled, HbA1c is pending. Continue to hold home dose of metformin   Right breast cancer: ER positive. Continue on home dose of letrozole    GERD: continue on PPI   Discharge Instructions  Discharge Instructions    Diet - low sodium heart healthy   Complete by: As directed    Diet Carb Modified   Complete by: As directed    Discharge instructions   Complete by: As directed    F/u w/ neuro in 1 week. F/u w/ PCP in 1 week. ASA 81 mg and Plavix 75 mg daily for 3 weeks followed by Plavix monotherapy. Do not take NSAIDs with plavix that includes meloxicam as there is an increase risk of bleeding   Increase activity slowly   Complete by: As directed      Allergies as of 09/07/2020   No Known Allergies     Medication List    STOP taking these medications   meloxicam 15 MG tablet  Commonly known as: MOBIC     TAKE these medications   acetaminophen 500 MG tablet Commonly known as: TYLENOL Take 500 mg by mouth every 6 (six) hours as needed for mild pain or headache.   aspirin EC 81 MG tablet Take 1 tablet (81 mg total) by mouth daily. Swallow whole. Take for [redacted] weeks along with plavix and then stop aspirin but continue the plavix. What changed: additional instructions   atorvastatin 40 MG tablet Commonly known as: LIPITOR TAKE 1 TABLET EVERY EVENING   bimatoprost 0.01 % Soln Commonly known as:  LUMIGAN Place 1 drop into both eyes at bedtime.   Calcium Carb-Cholecalciferol 600-800 MG-UNIT Tabs Take 1 tablet by mouth 2 (two) times daily.   chlorthalidone 25 MG tablet Commonly known as: HYGROTON Take 1 tablet (25 mg total) by mouth daily.   cholecalciferol 1000 units tablet Commonly known as: VITAMIN D Take 1,000 Units by mouth daily.   clopidogrel 75 MG tablet Commonly known as: PLAVIX Take 1 tablet (75 mg total) by mouth daily. Start taking on: September 08, 2020   gabapentin 300 MG capsule Commonly known as: NEURONTIN TAKE 2 CAPSULES AT BEDTIME   latanoprost 0.005 % ophthalmic solution Commonly known as: XALATAN Place 1 drop into both eyes at bedtime.   letrozole 2.5 MG tablet Commonly known as: FEMARA TAKE 1 TABLET (2.5 MG TOTAL) BY MOUTH DAILY.   losartan 50 MG tablet Commonly known as: COZAAR Take 1 tablet (50 mg total) by mouth daily.   meclizine 25 MG tablet Commonly known as: ANTIVERT Take 0.5 tablets (12.5 mg total) by mouth 3 (three) times daily as needed for dizziness.   metFORMIN 1000 MG tablet Commonly known as: GLUCOPHAGE Take 1 tablet (1,000 mg total) by mouth 2 (two) times daily with a meal.   metoprolol tartrate 25 MG tablet Commonly known as: LOPRESSOR TAKE 1/2 TABLET (12.5 MG TOTAL) TWICE DAILY   multivitamin with minerals Tabs tablet Take 1 tablet by mouth daily.   omega-3 acid ethyl esters 1 g capsule Commonly known as: LOVAZA Take 1 g by mouth daily.   pantoprazole 40 MG tablet Commonly known as: PROTONIX TAKE 1 TABLET TWICE DAILY   pyridoxine 100 MG tablet Commonly known as: B-6 Take 100 mg by mouth daily.   sertraline 50 MG tablet Commonly known as: ZOLOFT Take 1.5 tablets (75 mg total) by mouth daily.   sertraline 100 MG tablet Commonly known as: ZOLOFT Take 50 mg by mouth daily. Doctor increased dose to 100mg  05/2020  but patient reports that she never increased the dose. She continued taking 50mg  daily.   vitamin  B-12 500 MCG tablet Commonly known as: CYANOCOBALAMIN Take 1,000 mcg by mouth daily.   vitamin C 500 MG tablet Commonly known as: ASCORBIC ACID Take 1,000 mg by mouth daily.   vitamin E 180 MG (400 UNITS) capsule Take 400 Units by mouth daily.       No Known Allergies  Consultations:  Neuro, Dr. Leonel Ramsay    Procedures/Studies: CT ANGIO HEAD W OR WO CONTRAST  Result Date: 09/06/2020 CLINICAL DATA:  Follow-up examination for acute stroke. EXAM: CT ANGIOGRAPHY HEAD AND NECK TECHNIQUE: Multidetector CT imaging of the head and neck was performed using the standard protocol during bolus administration of intravenous contrast. Multiplanar CT image reconstructions and MIPs were obtained to evaluate the vascular anatomy. Carotid stenosis measurements (when applicable) are obtained utilizing NASCET criteria, using the distal internal carotid diameter as the denominator. CONTRAST:  36mL OMNIPAQUE IOHEXOL 350  MG/ML SOLN COMPARISON:  Prior MRI from earlier the same day. FINDINGS: CT HEAD FINDINGS Brain: Atrophy with moderate chronic microvascular ischemic disease. Previously identified right parietal infarct grossly stable in size and morphology. No evidence for hemorrhagic transformation or mass effect. No other acute large vessel territory infarct. No mass lesion, midline shift or mass effect. No hydrocephalus or extra-axial fluid collection. Vascular: No hyperdense vessel. Skull: Scalp soft tissues and calvarium within normal limits. Sinuses: Paranasal sinuses and mastoid air cells are clear. Orbits: Globes and orbital soft tissues demonstrate no acute finding. Review of the MIP images confirms the above findings CTA NECK FINDINGS Aortic arch: Visualized aortic arch of normal caliber with normal branch pattern. Mild atheromatous change about the arch and origin of the great vessels without hemodynamically significant stenosis. Right carotid system: Right CCA tortuous proximally but is widely patent  to the bifurcation without stenosis. Eccentric calcified plaque at the origin of the right ICA without hemodynamically significant stenosis. Right ICA widely patent distally without stenosis, dissection or occlusion. Left carotid system: Left CCA tortuous proximally but is widely patent without stenosis. Mild eccentric calcified plaque at the origin of the left ICA without significant stenosis. Left ICA patent distally without stenosis, dissection or occlusion. Vertebral arteries: Both vertebral arteries arise from subclavian arteries. No proximal subclavian artery stenosis. Vertebral arteries widely patent within the neck without stenosis, dissection or occlusion. Skeleton: No visible acute osseous abnormality. Moderate multilevel cervical spondylosis. No discrete or worrisome osseous lesions. Other neck: No other acute soft tissue abnormality within the neck. No mass or adenopathy. Upper chest: Visualized upper chest demonstrates no acute finding. Review of the MIP images confirms the above findings CTA HEAD FINDINGS Anterior circulation: Petrous segments widely patent. Scattered atheromatous change throughout the carotid siphons with no more than mild diffuse narrowing. A1 segments patent bilaterally. Normal anterior communicating artery complex. Azygos ACA widely patent to its distal aspect. M1 segments widely patent. Normal MCA bifurcations. Distal MCA branches well perfused and symmetric. Posterior circulation: Both V4 segments widely patent to the vertebrobasilar junction. Right PICA origin patent and normal. Left PICA not visualized. Basilar patent to its distal aspect without stenosis. Superior cerebellar arteries patent bilaterally. Left PCA supplied via the basilar. Right PCA supplied via the basilar as well as a prominent right posterior communicating artery. Both PCAs well perfused to their distal aspects. Venous sinuses: Patent. Anatomic variants: Azygos ACA.  No aneurysm. Review of the MIP images  confirms the above findings IMPRESSION: CT HEAD IMPRESSION: 1. Stable size and appearance of all vein acute ischemic right parietal lobe infarct. No associated hemorrhage or mass effect. 2. No other new acute intracranial abnormality. 3. Underlying atrophy with moderate chronic microvascular ischemic disease. CTA HEAD AND NECK IMPRESSION: 1. Negative CTA for large vessel occlusion. 2. Mild atheromatous change about the carotid bifurcations and carotid siphons without hemodynamically significant stenosis. 3. Azygos ACA. Electronically Signed   By: Jeannine Boga M.D.   On: 09/06/2020 20:43   CT HEAD WO CONTRAST  Result Date: 09/05/2020 CLINICAL DATA:  Transient ischemic attack (TIA) Patient reports left leg ataxia and increased left arm weakness. EXAM: CT HEAD WITHOUT CONTRAST TECHNIQUE: Contiguous axial images were obtained from the base of the skull through the vertex without intravenous contrast. COMPARISON:  Head CT and brain MRI April 2020 FINDINGS: Brain: No acute hemorrhage. No evidence of acute ischemia. Symmetric bilateral basal gangliar mineralization is unchanged from prior exam. Normal for age atrophy. There is moderate periventricular and deep white matter hypodensity  consistent with chronic small vessel ischemia. No hydrocephalus, mass effect or midline shift. No subdural or extra-axial collection. Vascular: Atherosclerosis of skullbase vasculature without hyperdense vessel or abnormal calcification. Skull: No fracture or focal lesion. Sinuses/Orbits: Paranasal sinuses and mastoid air cells are clear. The visualized orbits are unremarkable. Other: None. IMPRESSION: 1. No acute intracranial abnormality. 2. Unchanged chronic small vessel ischemia. Electronically Signed   By: Keith Rake M.D.   On: 09/05/2020 23:15   CT ANGIO NECK W OR WO CONTRAST  Result Date: 09/06/2020 CLINICAL DATA:  Follow-up examination for acute stroke. EXAM: CT ANGIOGRAPHY HEAD AND NECK TECHNIQUE: Multidetector  CT imaging of the head and neck was performed using the standard protocol during bolus administration of intravenous contrast. Multiplanar CT image reconstructions and MIPs were obtained to evaluate the vascular anatomy. Carotid stenosis measurements (when applicable) are obtained utilizing NASCET criteria, using the distal internal carotid diameter as the denominator. CONTRAST:  35mL OMNIPAQUE IOHEXOL 350 MG/ML SOLN COMPARISON:  Prior MRI from earlier the same day. FINDINGS: CT HEAD FINDINGS Brain: Atrophy with moderate chronic microvascular ischemic disease. Previously identified right parietal infarct grossly stable in size and morphology. No evidence for hemorrhagic transformation or mass effect. No other acute large vessel territory infarct. No mass lesion, midline shift or mass effect. No hydrocephalus or extra-axial fluid collection. Vascular: No hyperdense vessel. Skull: Scalp soft tissues and calvarium within normal limits. Sinuses: Paranasal sinuses and mastoid air cells are clear. Orbits: Globes and orbital soft tissues demonstrate no acute finding. Review of the MIP images confirms the above findings CTA NECK FINDINGS Aortic arch: Visualized aortic arch of normal caliber with normal branch pattern. Mild atheromatous change about the arch and origin of the great vessels without hemodynamically significant stenosis. Right carotid system: Right CCA tortuous proximally but is widely patent to the bifurcation without stenosis. Eccentric calcified plaque at the origin of the right ICA without hemodynamically significant stenosis. Right ICA widely patent distally without stenosis, dissection or occlusion. Left carotid system: Left CCA tortuous proximally but is widely patent without stenosis. Mild eccentric calcified plaque at the origin of the left ICA without significant stenosis. Left ICA patent distally without stenosis, dissection or occlusion. Vertebral arteries: Both vertebral arteries arise from  subclavian arteries. No proximal subclavian artery stenosis. Vertebral arteries widely patent within the neck without stenosis, dissection or occlusion. Skeleton: No visible acute osseous abnormality. Moderate multilevel cervical spondylosis. No discrete or worrisome osseous lesions. Other neck: No other acute soft tissue abnormality within the neck. No mass or adenopathy. Upper chest: Visualized upper chest demonstrates no acute finding. Review of the MIP images confirms the above findings CTA HEAD FINDINGS Anterior circulation: Petrous segments widely patent. Scattered atheromatous change throughout the carotid siphons with no more than mild diffuse narrowing. A1 segments patent bilaterally. Normal anterior communicating artery complex. Azygos ACA widely patent to its distal aspect. M1 segments widely patent. Normal MCA bifurcations. Distal MCA branches well perfused and symmetric. Posterior circulation: Both V4 segments widely patent to the vertebrobasilar junction. Right PICA origin patent and normal. Left PICA not visualized. Basilar patent to its distal aspect without stenosis. Superior cerebellar arteries patent bilaterally. Left PCA supplied via the basilar. Right PCA supplied via the basilar as well as a prominent right posterior communicating artery. Both PCAs well perfused to their distal aspects. Venous sinuses: Patent. Anatomic variants: Azygos ACA.  No aneurysm. Review of the MIP images confirms the above findings IMPRESSION: CT HEAD IMPRESSION: 1. Stable size and appearance of all vein acute  ischemic right parietal lobe infarct. No associated hemorrhage or mass effect. 2. No other new acute intracranial abnormality. 3. Underlying atrophy with moderate chronic microvascular ischemic disease. CTA HEAD AND NECK IMPRESSION: 1. Negative CTA for large vessel occlusion. 2. Mild atheromatous change about the carotid bifurcations and carotid siphons without hemodynamically significant stenosis. 3. Azygos ACA.  Electronically Signed   By: Jeannine Boga M.D.   On: 09/06/2020 20:43   MR BRAIN WO CONTRAST  Result Date: 09/06/2020 CLINICAL DATA:  Left leg ataxia.  Left arm weakness. EXAM: MRI HEAD WITHOUT CONTRAST TECHNIQUE: Multiplanar, multiecho pulse sequences of the brain and surrounding structures were obtained without intravenous contrast. COMPARISON:  Head CT yesterday. FINDINGS: Brain: Diffusion imaging shows a 1 cm acute infarction in the right parietal deep and subcortical white matter. Minimal overlying cortical involvement. No other acute infarction. Chronic small-vessel ischemic changes affect the pons. No focal cerebellar finding. Cerebral hemispheres show moderate chronic small-vessel ischemic changes of the white matter, thalami and basal ganglia. No large vessel territory infarction. No mass, hemorrhage, hydrocephalus or extra-axial collection. Vascular: Major vessels at the base of the brain show flow. Skull and upper cervical spine: Negative Sinuses/Orbits: Clear/normal Other: None IMPRESSION: 1. 1 cm acute infarction in the right parietal deep and subcortical white matter. Minimal overlying cortical involvement. No hemorrhage or mass effect. 2. Moderate chronic small-vessel ischemic changes elsewhere throughout the brain. Electronically Signed   By: Nelson Chimes M.D.   On: 09/06/2020 12:49      Subjective: Pt c/o fatigue    Discharge Exam: Vitals:   09/07/20 1155 09/07/20 1521  BP: 124/74 131/83  Pulse: 80 86  Resp: 16 18  Temp: 98.1 F (36.7 C) 98.4 F (36.9 C)  SpO2: 99% 96%   Vitals:   09/07/20 0449 09/07/20 0801 09/07/20 1155 09/07/20 1521  BP: (!) 134/91 132/77 124/74 131/83  Pulse: 89 88 80 86  Resp: 16 18 16 18   Temp: 98.4 F (36.9 C) 98.5 F (36.9 C) 98.1 F (36.7 C) 98.4 F (36.9 C)  TempSrc: Oral Oral Oral Oral  SpO2: 97% 99% 99% 96%  Weight:      Height:        General: Pt is alert, awake, not in acute distress Cardiovascular: S1/S2 +, no rubs, no  gallops Respiratory: CTA bilaterally, no wheezing, no rhonchi Abdominal: Soft, NT, obese, bowel sounds + Extremities:  no cyanosis    The results of significant diagnostics from this hospitalization (including imaging, microbiology, ancillary and laboratory) are listed below for reference.     Microbiology: Recent Results (from the past 240 hour(s))  SARS CORONAVIRUS 2 (TAT 6-24 HRS) Nasopharyngeal Nasopharyngeal Swab     Status: None   Collection Time: 09/06/20  4:11 PM   Specimen: Nasopharyngeal Swab  Result Value Ref Range Status   SARS Coronavirus 2 NEGATIVE NEGATIVE Final    Comment: (NOTE) SARS-CoV-2 target nucleic acids are NOT DETECTED.  The SARS-CoV-2 RNA is generally detectable in upper and lower respiratory specimens during the acute phase of infection. Negative results do not preclude SARS-CoV-2 infection, do not rule out co-infections with other pathogens, and should not be used as the sole basis for treatment or other patient management decisions. Negative results must be combined with clinical observations, patient history, and epidemiological information. The expected result is Negative.  Fact Sheet for Patients: SugarRoll.be  Fact Sheet for Healthcare Providers: https://www.woods-mathews.com/  This test is not yet approved or cleared by the [place] FDA and  has been  authorized for detection and/or diagnosis of SARS-CoV-2 by FDA under an Emergency Use Authorization (EUA). This EUA will remain  in effect (meaning this test can be used) for the duration of the COVID-19 declaration under Se ction 564(b)(1) of the Act, 21 U.S.C. section 360bbb-3(b)(1), unless the authorization is terminated or revoked sooner.  Performed at [facility], [place] [address], [place], [place] [address]      Labs: BNP (last 3 results) No results for input(s): BNP in the last 8760 hours. Basic Metabolic Panel: Recent Labs  Lab  09/05/20 2246  NA 142  K 3.5  CL 101  CO2 27  GLUCOSE 137*  BUN 18  CREATININE 0.78  CALCIUM 10.3   Liver Function Tests: No results for input(s): AST, ALT, ALKPHOS, BILITOT, PROT, ALBUMIN in the last 168 hours. No results for input(s): LIPASE, AMYLASE in the last 168 hours. No results for input(s): AMMONIA in the last 168 hours. CBC: Recent Labs  Lab 09/05/20 2246  WBC 7.4  HGB 13.8  HCT 40.0  MCV 88.7  PLT 266   Cardiac Enzymes: No results for input(s): CKTOTAL, CKMB, CKMBINDEX, TROPONINI in the last 168 hours. BNP: Invalid input(s): POCBNP CBG: No results for input(s): GLUCAP in the last 168 hours. D-Dimer No results for input(s): DDIMER in the last 72 hours. Hgb A1c Recent Labs    09/07/20 0641  HGBA1C 7.6*   Lipid Profile Recent Labs    09/07/20 0641  CHOL 105  HDL 30*  LDLCALC 57  TRIG 89  CHOLHDL 3.5   Thyroid function studies No results for input(s): TSH, T4TOTAL, T3FREE, THYROIDAB in the last 72 hours.  Invalid input(s): FREET3 Anemia work up No results for input(s): VITAMINB12, FOLATE, FERRITIN, TIBC, IRON, RETICCTPCT in the last 72 hours. Urinalysis    Component Value Date/Time   COLORURINE YELLOW (A) 09/06/2020 0223   APPEARANCEUR HAZY (A) 09/06/2020 0223   APPEARANCEUR Clear 03/08/2012 2240   LABSPEC 1.015 09/06/2020 0223   LABSPEC 1.012 03/08/2012 2240   PHURINE 7.0 09/06/2020 0223   GLUCOSEU NEGATIVE 09/06/2020 0223   GLUCOSEU Negative 03/08/2012 2240   HGBUR NEGATIVE 09/06/2020 0223   BILIRUBINUR NEGATIVE 09/06/2020 0223   BILIRUBINUR negative 06/09/2020 1352   BILIRUBINUR Negative 03/08/2012 2240   KETONESUR NEGATIVE 09/06/2020 0223   PROTEINUR NEGATIVE 09/06/2020 0223   UROBILINOGEN 0.2 06/09/2020 1352   NITRITE NEGATIVE 09/06/2020 0223   LEUKOCYTESUR LARGE (A) 09/06/2020 0223   LEUKOCYTESUR 1+ 03/08/2012 2240   Sepsis Labs Invalid input(s): PROCALCITONIN,  WBC,  LACTICIDVEN Microbiology Recent Results (from the past  240 hour(s))  SARS CORONAVIRUS 2 (TAT 6-24 HRS) Nasopharyngeal Nasopharyngeal Swab     Status: None   Collection Time: 09/06/20  4:11 PM   Specimen: Nasopharyngeal Swab  Result Value Ref Range Status   SARS Coronavirus 2 NEGATIVE NEGATIVE Final    Comment: (NOTE) SARS-CoV-2 target nucleic acids are NOT DETECTED.  The SARS-CoV-2 RNA is generally detectable in upper and lower respiratory specimens during the acute phase of infection. Negative results do not preclude SARS-CoV-2 infection, do not rule out co-infections with other pathogens, and should not be used as the sole basis for treatment or other patient management decisions. Negative results must be combined with clinical observations, patient history, and epidemiological information. The expected result is Negative.  Fact Sheet for Patients: SugarRoll.be  Fact Sheet for Healthcare Providers: https://www.woods-mathews.com/  This test is not yet approved or cleared by the [place] FDA and  has been authorized for  detection and/or diagnosis of SARS-CoV-2 by FDA under an Emergency Use Authorization (EUA). This EUA will remain  in effect (meaning this test can be used) for the duration of the COVID-19 declaration under Se ction 564(b)(1) of the Act, 21 U.S.C. section 360bbb-3(b)(1), unless the authorization is terminated or revoked sooner.  Performed at [facility], [place] [address], [place], [place] [address]      Time coordinating discharge: Over 30 minutes  SIGNED:   Wyvonnia Dusky, MD  [facility] 09/07/2020, 3:26 PM Pager   If 7PM-7AM, please contact night-coverage

## 2020-09-07 NOTE — Evaluation (Signed)
Physical Therapy Evaluation Patient Details Name: Leah Schaefer MRN: [id number] DOB: Dec 19, 1940 Today's Date: 09/07/2020   History of Present Illness  80 y.o. female with history of hypertension, hyperlipidemia, diabetes who presents with left-sided numbness that began yesterday around 10 AM.  Due to the symptoms being persistent, she sought care in the emergency department today where an MRI was performed and demonstrates a small subcortical stroke on the right  Clinical Impression  The pt presents this session with some L sided weakness with mild coordination deficits. She demonstrates wide BOS and arms spread when ambulating without an AD. At this time she demonstrates safest gait when ambulating with RW. She is able to complete some high level balance activities, however requires close supervision d/t unsteadiness. The pt is educated on the benefits of follow up PT in order to address functional deficits. She refuses HHPT and is agreeable to take a script home for OPPT. The pt is safe to d/c home with intermittent family assistance and OPPT.     Follow Up Recommendations Outpatient PT    Equipment Recommendations       Recommendations for Other Services       Precautions / Restrictions Precautions Precautions: Fall Restrictions Weight Bearing Restrictions: No      Mobility  Bed Mobility Overal bed mobility:  (Pt greeted upright in bedside chair. No bed mobility completed this session.) Bed Mobility: Supine to Sit;Sit to Supine     Supine to sit: Supervision Sit to supine: Supervision   General bed mobility comments: min cuing for technique and hand placement    Transfers Overall transfer level: Needs assistance Equipment used: Rolling walker (2 wheeled);None Transfers: Sit to/from Stand Sit to Stand: Supervision Stand pivot transfers: Min guard       General transfer comment: pt with increased unsteadiness and balance deficits when completing transfers without AD  d/t immediate standing balance deficits.  Ambulation/Gait Ambulation/Gait assistance: Supervision Gait Distance (Feet): 100 Feet Assistive device: Rolling walker (2 wheeled);None       General Gait Details: 30' with RW with no overt LOB noted. 66' without AD, pt demonstrates wider BOS and arms spread for balance.  Stairs            Wheelchair Mobility    Modified Rankin (Stroke Patients Only)       Balance Overall balance assessment: Needs assistance Sitting-balance support: Feet supported Sitting balance-Leahy Scale: Good Sitting balance - Comments: no LOB   Standing balance support: During functional activity Standing balance-Leahy Scale: Fair Standing balance comment: UE support needed             High level balance activites: Side stepping;Backward walking High Level Balance Comments: High level balance activites completed with RW. Pt requiring close supervision for these activities d/t poor balance.             Pertinent Vitals/Pain Pain Assessment: No/denies pain    Home Living Family/patient expects to be discharged to:: Private residence Living Arrangements: Alone Available Help at Discharge: Family;Available PRN/intermittently;Other (Comment) (Reports need to be independent during the day as her daughter works from home and would be limited in her ability to assist during business hours.) Type of Home: House Home Access: Stairs to enter Entrance Stairs-Rails: Left Entrance Stairs-Number of Steps: back steps 4 Home Layout: One level Home Equipment: Shower seat;Cane - single point;Grab bars - tub/shower;Walker - 4 wheels Additional Comments: Pt lives at home alone but daughter lives next door and spends the night. Daughter also works from  home.    Prior Function Level of Independence: Independent (Patient reports having L sided weakness prior to CVA)         Comments: Pt reports no use of AD for mobility/self care tasks. She has equipment  from husband who passed away 1 yr ago.     Hand Dominance   Dominant Hand: Left    Extremity/Trunk Assessment   Upper Extremity Assessment Upper Extremity Assessment: Overall WFL for tasks assessed    Lower Extremity Assessment Lower Extremity Assessment: Overall WFL for tasks assessed    Cervical / Trunk Assessment Cervical / Trunk Assessment: Normal  Communication   Communication: No difficulties  Cognition Arousal/Alertness: Awake/alert Behavior During Therapy: WFL for tasks assessed/performed Overall Cognitive Status: Within Functional Limits for tasks assessed                                        General Comments  The pt is educated on proper technique to ascend/descend steps (lead with dominant LE, step to pattern to ascend/lead with non-dominant LE, step to pattern to descend) with use of railings.     Exercises     Assessment/Plan    PT Assessment Patient needs continued PT services  PT Problem List Decreased balance;Decreased strength;Decreased mobility       PT Treatment Interventions Therapeutic activities;Gait training;Neuromuscular re-education;Functional mobility training    PT Goals (Current goals can be found in the Care Plan section)  Acute Rehab PT Goals Patient Stated Goal: to go home PT Goal Formulation: With patient Time For Goal Achievement: 09/21/20 Potential to Achieve Goals: Good    Frequency Min 2X/week   Barriers to discharge        Co-evaluation               AM-PAC PT "6 Clicks" Mobility  Outcome Measure Help needed turning from your back to your side while in a flat bed without using bedrails?: None Help needed moving from lying on your back to sitting on the side of a flat bed without using bedrails?: None Help needed moving to and from a bed to a chair (including a wheelchair)?: A Little Help needed standing up from a chair using your arms (e.g., wheelchair or bedside chair)?: A Little Help needed  to walk in hospital room?: A Little Help needed climbing 3-5 steps with a railing? : A Little 6 Click Score: 20    End of Session Equipment Utilized During Treatment: Gait belt Activity Tolerance: Patient tolerated treatment well Patient left: in chair;with call bell/phone within reach Nurse Communication: Mobility status PT Visit Diagnosis: Unsteadiness on feet (R26.81);Hemiplegia and hemiparesis Hemiplegia - Right/Left: Left Hemiplegia - dominant/non-dominant: Non-dominant Hemiplegia - caused by: Cerebral infarction    Time: 1062-6948 PT Time Calculation (min) (ACUTE ONLY): 24 min   Charges:     PT Treatments $Gait Training: 23-37 mins        2:03 PM, 09/07/20 Lynisha Osuch A. Saverio Danker PT, DPT Physical Therapist - [facility]  Janisa Labus A Naveed Humphres 09/07/2020, 1:58 PM

## 2020-09-07 NOTE — Plan of Care (Signed)
  Problem: Education: Goal: Knowledge of General Education information will improve Description Including pain rating scale, medication(s)/side effects and non-pharmacologic comfort measures Outcome: Progressing   

## 2020-09-07 NOTE — TOC Initial Note (Signed)
Transition of Care [facility]) - Initial/Assessment Note    Patient Details  Name: Leah Schaefer MRN: [id number] Date of Birth: 06-18-1941  Transition of Care [facility]) CM/SW Contact:    Shelbie Ammons, RN Phone Number: 09/07/2020, 3:17 PM  Clinical Narrative:   RNCM reached out to patient regarding recommendations for OPPT. Patient reports to feeling well today and feels like she is ready to go home. Discussed with patient recommendations for OPPT and patient reports that she would prefer to wait and talk this over with her primary doctor to determine if this is something she needs when she is ready.                 Expected Discharge Plan: Home/Self Care Barriers to Discharge: No Barriers Identified   Patient Goals and CMS Choice        Expected Discharge Plan and Services Expected Discharge Plan: Home/Self Care       Living arrangements for the past 2 months: Single Family Home Expected Discharge Date: 09/07/20                                    Prior Living Arrangements/Services Living arrangements for the past 2 months: Single Family Home Lives with:: Self Patient language and need for interpreter reviewed:: Yes Do you feel safe going back to the place where you live?: Yes      Need for Family Participation in Patient Care: Yes (Comment) Care giver support system in place?: Yes (comment)   Criminal Activity/Legal Involvement Pertinent to Current Situation/Hospitalization: No - Comment as needed  Activities of Daily Living Home Assistive Devices/Equipment: None ADL Screening (condition at time of admission) Patient's cognitive ability adequate to safely complete daily activities?: Yes Is the patient deaf or have difficulty hearing?: No Does the patient have difficulty seeing, even when wearing glasses/contacts?: No Does the patient have difficulty concentrating, remembering, or making decisions?: No Patient able to express need for assistance with ADLs?: Yes Does  the patient have difficulty dressing or bathing?: No Independently performs ADLs?: Yes (appropriate for developmental age) Does the patient have difficulty walking or climbing stairs?: No Weakness of Legs: None Weakness of Arms/Hands: None  Permission Sought/Granted                  Emotional Assessment Appearance:: Appears stated age Attitude/Demeanor/Rapport: Engaged Affect (typically observed): Appropriate Orientation: : Oriented to Self,Oriented to Place,Oriented to  Time,Oriented to Situation Alcohol / Substance Use: Not Applicable Psych Involvement: No (comment)  Admission diagnosis:  Stroke [facility]) [I63.9] Cerebrovascular accident (CVA), unspecified mechanism ([place]) [I63.9] Patient Active Problem List   Diagnosis Date Noted  . Stroke ([place]) 09/06/2020  . Leukocytes in urine 06/09/2020  . Grief 06/09/2020  . Episode of recurrent major depressive disorder ([place]) 06/09/2020  . Urinary symptom or sign 06/09/2020  . Left sided numbness 11/06/2018  . 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 Umbilical pain 99/37/1696  . Breast mass, left 04/07/2016  . Controlled type 2 diabetes mellitus without complication ([place]) 78/93/8101  . Allergic rhinitis 03/30/2015  . Anxiety 03/30/2015  . Carotid arterial disease ([place]) 03/30/2015  . Diabetes mellitus, type 2 ([place]) 03/30/2015  . Essential (primary) hypertension 03/30/2015  . Hypercholesteremia 03/30/2015  . Left leg pain 03/30/2015  . LBP (low back pain) 03/30/2015  . Neuropathic pain 03/30/2015  . Burning or prickling sensation 03/30/2015  . Neuralgia neuritis, sciatic nerve 03/30/2015  .  Skin cyst 11/24/2014  . Carcinoma of upper-outer quadrant of right breast in female, estrogen receptor positive ([place]) 08/17/2011  . Allergic reaction 11/29/2009  . Adaptation reaction 04/30/2009  . Acid reflux 04/30/2009  . Stricture and stenosis of cervix 01/28/2009   PCP:  Jerrol Banana., MD Pharmacy:   [facility] -  [place], [place] [address] Phone: [phone] Fax: [phone]  [facility] [address] [place] - [address] [place]) [place] [address] Phone: [phone] Fax: [phone]     Social Determinants of Health (SDOH) Interventions    Readmission Risk Interventions No flowsheet data found.

## 2020-09-07 NOTE — Progress Notes (Signed)
Subjective: She reports continued to improve  Exam: Vitals:   09/07/20 0801 09/07/20 1155  BP: 132/77 124/74  Pulse: 88 80  Resp: 18 16  Temp: 98.5 F (36.9 C) 98.1 F (36.7 C)  SpO2: 99% 99%   Gen: In bed, NAD Resp: non-labored breathing, no acute distress Abd: soft, nt  Neuro: MS: Awake alert oriented and appropriate CN: Face symmetric Motor: She still has mild weakness of left arm extension Sensory: Mildly decreased to light touch in the left arm  Pertinent Labs: LDL 57  CTA head neck with no hemodynamically significant large vessel atherosclerosis  Impression: 80 year old female with likely small vessel ischemic stroke.  Her LDL is at goal, and therefore does not need increase of her statin therapy.  I would continue dual antiplatelet therapy for 3 weeks followed by Plavix monotherapy.   Recommendations: 1) ASA 81 mg and Plavix 25 mg daily for 3 weeks followed by Plavix monotherapy 2) continue home Lipitor 40 mg daily 3) continue DM and hypertension control 4) if no PT/OT needs identified, and echo without embolic source, okay to DC from neurology standpoint  Roland Rack, MD [facility] [phone]  If 7pm- 7am, please page neurology on call as listed in [place].

## 2020-09-07 NOTE — Evaluation (Signed)
Occupational Therapy Evaluation Patient Details Name: Leah Schaefer MRN: [id number] DOB: Sep 11, 1940 Today's Date: 09/07/2020    History of Present Illness 80 y.o. female with history of hypertension, hyperlipidemia, diabetes who presents with left-sided numbness that began yesterday around 10 AM.  Due to the symptoms being persistent, she sought care in the emergency department today where an MRI was performed and demonstrates a small subcortical stroke on the right   Clinical Impression   Patient presenting with decreased I in self care, balance, functional mobility/transfer, endurance, and safety awareness. Pt reporting L UE weakness and sensation has resolved and is able to utilize in B UE coordination tasks without issue.  Patient reports living at home alone PTA. Her daughter will be able to stay and provide 24/7 assist/supervision at discharge. Patient currently functioning at supervision - min guard with use of RW. Pt needing more assistance without use of RW. She reports having one at home. Pt needing min guard for standing balance with LB self care tasks.  Patient will benefit from acute OT to increase overall independence in the areas of ADLs, functional mobility, and safety awareness in order to safely discharge home with family.    Follow Up Recommendations  No OT follow up;Supervision/Assistance - 24 hour    Equipment Recommendations  None recommended by OT . Pt has all needed equipment      Precautions / Restrictions Precautions Precautions: Fall      Mobility Bed Mobility Overal bed mobility: Needs Assistance Bed Mobility: Supine to Sit;Sit to Supine     Supine to sit: Supervision Sit to supine: Supervision   General bed mobility comments: min cuing for technique and hand placement    Transfers Overall transfer level: Needs assistance Equipment used: Rolling walker (2 wheeled) Transfers: Sit to/from [facility] Sit to Stand: Min guard Stand  pivot transfers: Min guard            Balance Overall balance assessment: Needs assistance Sitting-balance support: Feet supported Sitting balance-Leahy Scale: Good Sitting balance - Comments: no LOB   Standing balance support: During functional activity Standing balance-Leahy Scale: Fair Standing balance comment: UE support needed                           ADL either performed or assessed with clinical judgement   ADL Overall ADL's : Needs assistance/impaired Eating/Feeding: Modified independent;Sitting Eating/Feeding Details (indicate cue type and reason): pt able to open containers without assistance Grooming: Wash/dry hands;Wash/dry face;Oral care;Standing;Supervision/safety Grooming Details (indicate cue type and reason): supervision for safety while standing at sink             Lower Body Dressing: Supervision/safety;Sitting/lateral leans Lower Body Dressing Details (indicate cue type and reason): pulling B socks up while seated on EOB Toilet Transfer: Nature conservation officer;Ambulation;RW   Toileting- Water quality scientist and Hygiene: Min guard;Sit to/from stand         General ADL Comments: min guard for functional mobility with use of RW. Close supervision for standing tasks. Pt does fatigue quickly. She is using L UE in dominant way and OT encouraging pt to continue to use.     Vision Baseline Vision/History: Wears glasses Wears Glasses: Reading only Patient Visual Report: No change from baseline              Pertinent Vitals/Pain Pain Assessment: No/denies pain     Hand Dominance Left   Extremity/Trunk Assessment Upper Extremity Assessment Upper Extremity Assessment: Overall [facility]  for tasks assessed   Lower Extremity Assessment Lower Extremity Assessment: Defer to PT evaluation       Communication Communication Communication: No difficulties   Cognition Arousal/Alertness: Awake/alert Behavior During Therapy: WFL for tasks  assessed/performed Overall Cognitive Status: Within Functional Limits for tasks assessed                                                Home Living Family/patient expects to be discharged to:: Private residence Living Arrangements: Alone Available Help at Discharge: Family;Available 24 hours/day Type of Home: House Home Access: Stairs to enter [employer] of Steps: back steps 4 Entrance Stairs-Rails: Left Home Layout: One level     Bathroom Shower/Tub: Tub/shower unit;Curtain   Bathroom Toilet: Handicapped height     Home Equipment: Shower seat;Cane - single point;Walker - 2 wheels;Grab bars - tub/shower   Additional Comments: Pt lives at home alone but daughter lives next door and spends the night. Daughter also works from home and is able to provide pt with 24/7 at discharge per pt report.      Prior Functioning/Environment Level of Independence: Independent        Comments: Pt reports no use of AD for mobility/self care tasks. She has equipment from husband who passed away 1 yr ago.        OT Problem List: Decreased strength;Decreased activity tolerance;Impaired balance (sitting and/or standing);Decreased safety awareness;Decreased knowledge of precautions;Decreased coordination;Decreased knowledge of use of DME or AE      OT Treatment/Interventions: Self-care/ADL training;Therapeutic exercise;Neuromuscular education;Energy conservation;DME and/or AE instruction;Cognitive remediation/compensation;Therapeutic activities;Balance training;Patient/family education;Manual therapy    OT Goals(Current goals can be found in the care plan section) Acute Rehab OT Goals Patient Stated Goal: to go home OT Goal Formulation: With patient Time For Goal Achievement: 09/21/20 Potential to Achieve Goals: Good ADL Goals Pt Will Perform Grooming: with modified independence;standing Pt Will Transfer to Toilet: with modified independence;ambulating Pt Will  Perform Toileting - Clothing Manipulation and hygiene: with modified independence;sit to/from stand Pt Will Perform Tub/Shower Transfer: Shower transfer;with supervision  OT Frequency: Min 2X/week   Barriers to D/C:    none known at this time          AM-PAC OT "6 Clicks" Daily Activity     Outcome Measure Help from another person eating meals?: None Help from another person taking care of personal grooming?: A Little Help from another person toileting, which includes using toliet, bedpan, or urinal?: A Little Help from another person bathing (including washing, rinsing, drying)?: A Little Help from another person to put on and taking off regular upper body clothing?: None Help from another person to put on and taking off regular lower body clothing?: A Little 6 Click Score: 20   End of Session Equipment Utilized During Treatment: Rolling walker Nurse Communication: Mobility status  Activity Tolerance: Patient tolerated treatment well Patient left: in bed;with call bell/phone within reach;with bed alarm set  OT Visit Diagnosis: Unsteadiness on feet (R26.81);Muscle weakness (generalized) (M62.81);Hemiplegia and hemiparesis Hemiplegia - Right/Left: Left Hemiplegia - dominant/non-dominant: Dominant Hemiplegia - caused by: Cerebral infarction                Time: [id number] OT Time Calculation (min): 35 min Charges:  OT General Charges $OT Visit: 1 Visit OT Evaluation $OT Eval Low Complexity: 1 Low OT Treatments $Self Care/Home Management : 23-37 mins  Darleen Crocker, MS, OTR/L , CBIS ascom [phone]  09/07/20, 12:37 PM

## 2020-09-08 ENCOUNTER — Telehealth: Payer: Self-pay

## 2020-09-08 NOTE — Telephone Encounter (Signed)
Copied from [place] [id number]. Topic: General - Other >> Sep 08, 2020  8:37 AM Tessa Lerner A wrote: Reason for CRM: Patient's daughter has made contact requesting further clarification about scheduling an echocardiogram for patient Patient was discharged from [facility] on 09/07/20 and has HFU visit scheduled for 09/13/20 with Dr. Rosanna Randy Patient's daughter would like to know if that procedure will be done at visit or if an additional visit will need to be scheduled

## 2020-09-09 ENCOUNTER — Telehealth: Payer: Self-pay

## 2020-09-09 NOTE — Telephone Encounter (Signed)
Advised as below.  

## 2020-09-09 NOTE — Telephone Encounter (Signed)
Pt's daughter Ashok Cordia faxed FMLA form to be completed. Forms placed in Dr. Alben Spittle box and blank copy on file with medical records. Please advise. Thanks TNP

## 2020-09-09 NOTE — Telephone Encounter (Signed)
We can discuss at the visit whether she needs cardiology referral or just a test ordered.

## 2020-09-09 NOTE — Telephone Encounter (Signed)
Form was received and placed in providers box

## 2020-09-09 NOTE — Telephone Encounter (Signed)
Please review. Do we put in a cardiology referral when she comes in for HFU on 2/7? Please advise. Thanks!

## 2020-09-13 ENCOUNTER — Ambulatory Visit (INDEPENDENT_AMBULATORY_CARE_PROVIDER_SITE_OTHER): Payer: Medicare HMO | Admitting: Family Medicine

## 2020-09-13 ENCOUNTER — Encounter: Payer: Self-pay | Admitting: Family Medicine

## 2020-09-13 ENCOUNTER — Other Ambulatory Visit: Payer: Self-pay

## 2020-09-13 VITALS — BP 125/83 | HR 97 | Temp 98.2°F | Resp 18 | Ht 66.0 in | Wt 189.0 lb

## 2020-09-13 DIAGNOSIS — F330 Major depressive disorder, recurrent, mild: Secondary | ICD-10-CM

## 2020-09-13 DIAGNOSIS — I639 Cerebral infarction, unspecified: Secondary | ICD-10-CM

## 2020-09-13 DIAGNOSIS — I779 Disorder of arteries and arterioles, unspecified: Secondary | ICD-10-CM | POA: Diagnosis not present

## 2020-09-13 MED ORDER — SERTRALINE HCL 100 MG PO TABS: 50.0000 mg | ORAL_TABLET | Freq: Every day | ORAL | 2 refills | Status: DC

## 2020-09-13 MED ORDER — CLOPIDOGREL BISULFATE 75 MG PO TABS: 75.0000 mg | ORAL_TABLET | Freq: Every day | ORAL | 3 refills | Status: AC

## 2020-09-13 NOTE — Patient Instructions (Signed)
Stop aspirin in 2 weeks.

## 2020-09-13 NOTE — Progress Notes (Signed)
I,Leah Schaefer,acting as a scribe for [name], MD.,have documented all relevant documentation on the behalf of [name], MD,as directed by  [name], MD while in the presence of [name], MD.   Established patient visit   Patient: Leah Schaefer   DOB: 1941-01-14   80 y.o. Female  MRN: [id number] Visit Date: 09/13/2020  Today's healthcare provider: [name], MD   Chief Complaint  Patient presents with  . Hospitalization Follow-up   Subjective    HPI  Follow up Hospitalization With her CVA patient had left-sided weakness but she states she has had left arm and hand problems for years.  He was recommended to take dual antiplatelet therapy with aspirin and Plavix for 3 weeks and then stop the aspirin.  Daughter comes in with her today. Admits to being a little more anxious and depressed and is still on Zoloft 50 mg daily. Patient was admitted to [facility] on 09/06/2020 and discharged on 09/07/2020. She was treated for [facility] CVA,  Weakness. Treatment for this included; see notes in chart. Telephone follow up was done on none She reports good compliance with treatment. She reports this condition is improved.  --------------------------------------------------------------------  Patient states she feels better since hospital discharge.       Medications: Outpatient Medications Prior to Visit  Medication Sig  . acetaminophen (TYLENOL) 500 MG tablet Take 500 mg by mouth every 6 (six) hours as needed for mild pain or headache.   Marland Kitchen aspirin EC 81 MG tablet Take 1 tablet (81 mg total) by mouth daily. Swallow whole. Take for [redacted] weeks along with plavix and then stop aspirin but continue the plavix.  Marland Kitchen atorvastatin (LIPITOR) 40 MG tablet TAKE 1 TABLET EVERY EVENING  . bimatoprost (LUMIGAN) 0.01 % SOLN Place 1 drop into both eyes at bedtime.   . Calcium Carb-Cholecalciferol 600-800 MG-UNIT TABS Take 1 tablet by mouth 2 (two) times  daily.  . chlorthalidone (HYGROTON) 25 MG tablet Take 1 tablet (25 mg total) by mouth daily.  . cholecalciferol (VITAMIN D) 1000 units tablet Take 1,000 Units by mouth daily.  . cyanocobalamin 500 MCG tablet Take 1,000 mcg by mouth daily.   Marland Kitchen gabapentin (NEURONTIN) 300 MG capsule TAKE 2 CAPSULES AT BEDTIME  . latanoprost (XALATAN) 0.005 % ophthalmic solution Place 1 drop into both eyes at bedtime.   Marland Kitchen letrozole (FEMARA) 2.5 MG tablet TAKE 1 TABLET (2.5 MG TOTAL) BY MOUTH DAILY.  Marland Kitchen losartan (COZAAR) 50 MG tablet Take 1 tablet (50 mg total) by mouth daily.  . meclizine (ANTIVERT) 25 MG tablet Take 0.5 tablets (12.5 mg total) by mouth 3 (three) times daily as needed for dizziness.  . metFORMIN (GLUCOPHAGE) 1000 MG tablet Take 1 tablet (1,000 mg total) by mouth 2 (two) times daily with a meal.  . metoprolol tartrate (LOPRESSOR) 25 MG tablet TAKE 1/2 TABLET (12.5 MG TOTAL) TWICE DAILY  . Multiple Vitamin (MULTIVITAMIN WITH MINERALS) TABS tablet Take 1 tablet by mouth daily.  Marland Kitchen omega-3 acid ethyl esters (LOVAZA) 1 g capsule Take 1 g by mouth daily.   . pantoprazole (PROTONIX) 40 MG tablet TAKE 1 TABLET TWICE DAILY  . pyridoxine (B-6) 100 MG tablet Take 100 mg by mouth daily.  . vitamin C (ASCORBIC ACID) 500 MG tablet Take 1,000 mg by mouth daily.  . vitamin E 400 UNIT capsule Take 400 Units by mouth daily.  . [DISCONTINUED] clopidogrel (PLAVIX) 75 MG tablet Take 1 tablet (75 mg  total) by mouth daily.  . [DISCONTINUED] sertraline (ZOLOFT) 100 MG tablet Take 50 mg by mouth daily. Doctor increased dose to 100mg  05/2020  but patient reports that she never increased the dose. She continued taking 50mg  daily.  . sertraline (ZOLOFT) 50 MG tablet Take 1.5 tablets (75 mg total) by mouth daily. (Patient not taking: No sig reported)   No facility-administered medications prior to visit.    Review of Systems  Constitutional: Negative for appetite change, chills, fatigue and fever.  Respiratory: Negative for  chest tightness and shortness of breath.   Cardiovascular: Negative for chest pain and palpitations.  Gastrointestinal: Negative for abdominal pain, nausea and vomiting.  Neurological: Negative for dizziness and weakness.        Objective    BP 125/83 (BP Location: Right Arm, Patient Position: Sitting, Cuff Size: Large)   Pulse 97   Temp 98.2 F (36.8 C) (Oral)   Resp 18   Ht 5\' 6"  (1.676 m)   Wt 189 lb (85.7 kg)   SpO2 98%   BMI 30.51 kg/m  BP Readings from Last 3 Encounters:  09/13/20 125/83  09/07/20 131/83  07/07/20 (!) 160/87   Wt Readings from Last 3 Encounters:  09/13/20 189 lb (85.7 kg)  09/05/20 190 lb (86.2 kg)  07/07/20 192 lb (87.1 kg)       Physical Exam Vitals reviewed.  Constitutional:      Appearance: She is well-developed. She is obese.  HENT:     Head: Normocephalic and atraumatic.     Right Ear: External ear normal.     Left Ear: External ear normal.     Nose: Nose normal.  Eyes:     General: No scleral icterus. Neck:     Thyroid: No thyromegaly.  Cardiovascular:     Rate and Rhythm: Normal rate and regular rhythm.     Heart sounds: Normal heart sounds.  Pulmonary:     Effort: Pulmonary effort is normal.     Breath sounds: Normal breath sounds.  Abdominal:     Palpations: Abdomen is soft.  Skin:    General: Skin is warm and dry.  Neurological:     General: No focal deficit present.     Mental Status: She is alert and oriented to person, place, and time.  Psychiatric:        Mood and Affect: Mood normal.        Behavior: Behavior normal.        Thought Content: Thought content normal.        Judgment: Judgment normal.       No results found for any visits on 09/13/20.  Assessment & Plan     1. Cerebrovascular accident (CVA), unspecified mechanism ([place]) All risk factors treated.  Cardiology referral pending. - EKG 12-Lead - Ambulatory referral to Cardiology  2. Carotid artery disease, unspecified laterality, unspecified type  ([place]) Echocardiogram needed. - Ambulatory referral to Cardiology 3.  Depression Increase sertraline 100 mg daily.  Follow-up in a couple of months.  Return in about 3 months (around 12/11/2020).      I, [name], MD, have reviewed all documentation for this visit. The documentation on 09/18/20 for the exam, diagnosis, procedures, and orders are all accurate and complete.    Leah Suh Cranford Mon, MD  [facility] [phone] (phone) [phone] (fax)  [place]

## 2020-09-17 DIAGNOSIS — I6389 Other cerebral infarction: Secondary | ICD-10-CM | POA: Diagnosis not present

## 2020-09-17 DIAGNOSIS — G3184 Mild cognitive impairment, so stated: Secondary | ICD-10-CM | POA: Diagnosis not present

## 2020-09-17 DIAGNOSIS — G939 Disorder of brain, unspecified: Secondary | ICD-10-CM | POA: Diagnosis not present

## 2020-09-30 ENCOUNTER — Telehealth: Payer: Self-pay

## 2020-09-30 NOTE — Progress Notes (Signed)
Chronic Care Management Pharmacy Assistant   Name: Leah Schaefer  MRN: [id number] DOB: [birth year]  Reason for Encounter:Diabetes Disease State Call.  Patient Questions:  1.  Have you seen any other providers since your last visit? Yes, 09/06/2020 ED Cerebrovascular accident ,09/13/2020 PCP Richard Rosanna Randy  2.  Any changes in your medicines or health? Yes, 09/13/2020 PCP Richard Rosanna Randy Increase sertaline 100 mg daily     PCP : Jerrol Banana., MD  Allergies:  No Known Allergies  Medications: Outpatient Encounter Medications as of 09/30/2020  Medication Sig  . acetaminophen (TYLENOL) 500 MG tablet Take 500 mg by mouth every 6 (six) hours as needed for mild pain or headache.   Marland Kitchen aspirin EC 81 MG tablet Take 1 tablet (81 mg total) by mouth daily. Swallow whole. Take for [redacted] weeks along with plavix and then stop aspirin but continue the plavix.  Marland Kitchen atorvastatin (LIPITOR) 40 MG tablet TAKE 1 TABLET EVERY EVENING  . bimatoprost (LUMIGAN) 0.01 % SOLN Place 1 drop into both eyes at bedtime.   . Calcium Carb-Cholecalciferol 600-800 MG-UNIT TABS Take 1 tablet by mouth 2 (two) times daily.  . chlorthalidone (HYGROTON) 25 MG tablet Take 1 tablet (25 mg total) by mouth daily.  . cholecalciferol (VITAMIN D) 1000 units tablet Take 1,000 Units by mouth daily.  . clopidogrel (PLAVIX) 75 MG tablet Take 1 tablet (75 mg total) by mouth daily.  . cyanocobalamin 500 MCG tablet Take 1,000 mcg by mouth daily.   Marland Kitchen gabapentin (NEURONTIN) 300 MG capsule TAKE 2 CAPSULES AT BEDTIME  . latanoprost (XALATAN) 0.005 % ophthalmic solution Place 1 drop into both eyes at bedtime.   Marland Kitchen letrozole (FEMARA) 2.5 MG tablet TAKE 1 TABLET (2.5 MG TOTAL) BY MOUTH DAILY.  Marland Kitchen losartan (COZAAR) 50 MG tablet Take 1 tablet (50 mg total) by mouth daily.  . meclizine (ANTIVERT) 25 MG tablet Take 0.5 tablets (12.5 mg total) by mouth 3 (three) times daily as needed for dizziness.  . metFORMIN (GLUCOPHAGE) 1000 MG tablet Take 1  tablet (1,000 mg total) by mouth 2 (two) times daily with a meal.  . metoprolol tartrate (LOPRESSOR) 25 MG tablet TAKE 1/2 TABLET (12.5 MG TOTAL) TWICE DAILY  . Multiple Vitamin (MULTIVITAMIN WITH MINERALS) TABS tablet Take 1 tablet by mouth daily.  Marland Kitchen omega-3 acid ethyl esters (LOVAZA) 1 g capsule Take 1 g by mouth daily.   . pantoprazole (PROTONIX) 40 MG tablet TAKE 1 TABLET TWICE DAILY  . pyridoxine (B-6) 100 MG tablet Take 100 mg by mouth daily.  . sertraline (ZOLOFT) 100 MG tablet Take 0.5 tablets (50 mg total) by mouth daily. Doctor increased dose to 100mg  05/2020  but patient reports that she never increased the dose. She continued taking 50mg  daily.  . sertraline (ZOLOFT) 50 MG tablet Take 1.5 tablets (75 mg total) by mouth daily. (Patient not taking: No sig reported)  . vitamin C (ASCORBIC ACID) 500 MG tablet Take 1,000 mg by mouth daily.  . vitamin E 400 UNIT capsule Take 400 Units by mouth daily.   No facility-administered encounter medications on file as of 09/30/2020.    Current Diagnosis: Patient Active Problem List   Diagnosis Date Noted  . Stroke ([place]) 09/06/2020  . Leukocytes in urine 06/09/2020  . Grief 06/09/2020  . Episode of recurrent major depressive disorder ([place]) 06/09/2020  . Urinary symptom or sign 06/09/2020  . Left sided numbness 11/06/2018  . 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 Umbilical pain 17/79/3903  .  Breast mass, left 04/07/2016  . Controlled type 2 diabetes mellitus without complication ([place]) 74/03/1447  . Allergic rhinitis 03/30/2015  . Anxiety 03/30/2015  . Carotid arterial disease ([place]) 03/30/2015  . Diabetes mellitus, type 2 ([place]) 03/30/2015  . Essential (primary) hypertension 03/30/2015  . Hypercholesteremia 03/30/2015  . Left leg pain 03/30/2015  . LBP (low back pain) 03/30/2015  . Neuropathic pain 03/30/2015  . Burning or prickling sensation 03/30/2015  . Neuralgia neuritis, sciatic nerve 03/30/2015  . Skin cyst 11/24/2014  . Carcinoma  of upper-outer quadrant of right breast in female, estrogen receptor positive ([place]) 08/17/2011  . Allergic reaction 11/29/2009  . Adaptation reaction 04/30/2009  . Acid reflux 04/30/2009  . Stricture and stenosis of cervix 01/28/2009    Goals Addressed   None    Recent Relevant Labs: Lab Results  Component Value Date/Time   HGBA1C 7.6 (H) 09/07/2020 06:41 AM   HGBA1C 7.0 (H) 07/07/2020 08:57 AM   MICROALBUR 100 04/23/2017 09:31 AM   MICROALBUR Negative 03/30/2015 02:56 PM    Kidney Function Lab Results  Component Value Date/Time   CREATININE 0.78 09/05/2020 10:46 PM   CREATININE 0.69 06/23/2020 07:41 AM   CREATININE 0.92 09/04/2014 09:16 AM   CREATININE 0.89 02/18/2014 10:48 AM   GFRNONAA >60 09/05/2020 10:46 PM   GFRNONAA >60 09/04/2014 09:16 AM   GFRNONAA >60 02/18/2014 10:48 AM   GFRAA 83 05/31/2020 02:30 PM   GFRAA >60 09/04/2014 09:16 AM   GFRAA >60 02/18/2014 10:48 AM    . Current antihyperglycemic regimen:  ? Metformin 500mg  twice daily with meals . What recent interventions/DTPs have been made to improve glycemic control:  o None ID . Have there been any recent hospitalizations or ED visits since last visit with CPP? Yes  Yes, 09/06/2020 ED Cerebrovascular accident  . Patient reports hypoglycemic symptoms, including Pale, Sweaty, Shaky, Hungry, Nervous/irritable and Vision changes  o Patient states she has always have these symptoms but"it does not bother me". . Patient denies hyperglycemic symptoms, including blurry vision, excessive thirst, fatigue, polyuria and weakness . How often are you checking your blood sugar? once daily . What are your blood sugars ranging?  o Fasting: N/A o Before meals:  - Patient is unsure of the dates: o [phone] o After meals: None ID o Bedtime: N/A . During the week, how often does your blood glucose drop below 70? Once a week  o Patient states her blood sugar drop to either 60 or 64 unsure which one. . Are you  checking your feet daily/regularly?   Patient reports numbness, pain and tingling in both feet.   Adherence Review: Is the patient currently on a STATIN medication? Yes Is the patient currently on ACE/ARB medication? Yes Does the patient have >5 day gap between last estimated fill dates? Yes   Maryjean Ka  Follow-Up:  Pharmacist Review  [facility] [place] Clinical Pharmacist Assistant [phone]

## 2020-10-06 ENCOUNTER — Other Ambulatory Visit: Payer: Self-pay | Admitting: Family Medicine

## 2020-10-06 NOTE — Telephone Encounter (Signed)
Requested medication (s) are due for refill today: yes  Requested medication (s) are on the active medication list: yes  Last refill: 08/05/2020  Future visit scheduled: yes  Notes to clinic:  Medication not assigned to a protocol, review manually   Requested Prescriptions  Pending Prescriptions Disp Refills   letrozole ([place]) 2.5 MG tablet [Pharmacy Med Name: LETROZOLE 2.5 MG Tablet] 90 tablet 0    Sig: TAKE 1 TABLET (2.5 MG TOTAL) BY MOUTH DAILY.      Off-Protocol Failed - 10/06/2020  1:57 PM      Failed - Medication not assigned to a protocol, review manually.      Passed - Valid encounter within last 12 months    Recent Outpatient Visits           3 weeks ago Cerebrovascular accident (CVA), unspecified mechanism [facility])   [facility] Jerrol Banana., MD   3 months ago Controlled type 2 diabetes mellitus without complication, without long-term current use of insulin [facility])   [facility] Jerrol Banana., MD   3 months ago Urinary tract infection without hematuria, site unspecified   [facility] Flinchum, Kelby Aline, FNP   4 months ago [place] Jerrol Banana., MD   5 months ago Encounter for [employer] annual wellness exam   [facility] Jerrol Banana., MD       Future Appointments             In 2 months Jerrol Banana., MD [facility], [place]

## 2020-10-12 DIAGNOSIS — E119 Type 2 diabetes mellitus without complications: Secondary | ICD-10-CM | POA: Diagnosis not present

## 2020-10-12 DIAGNOSIS — I639 Cerebral infarction, unspecified: Secondary | ICD-10-CM | POA: Diagnosis not present

## 2020-10-12 DIAGNOSIS — E782 Mixed hyperlipidemia: Secondary | ICD-10-CM | POA: Diagnosis not present

## 2020-10-12 DIAGNOSIS — I10 Essential (primary) hypertension: Secondary | ICD-10-CM | POA: Diagnosis not present

## 2020-10-13 ENCOUNTER — Telehealth: Payer: Self-pay

## 2020-10-13 NOTE — Telephone Encounter (Signed)
Copied from [place] [id number]. Topic: General - Other >> Oct 12, 2020  3:20 PM Pawlus, Brayton Layman A wrote: Reason for CRM: Pt is currently at the cardiologist and needs Dr Caryn Section to [facility] over her latest EKG.

## 2020-10-14 NOTE — Telephone Encounter (Signed)
Faxed

## 2020-10-27 DIAGNOSIS — I639 Cerebral infarction, unspecified: Secondary | ICD-10-CM | POA: Diagnosis not present

## 2020-11-05 ENCOUNTER — Emergency Department: Payer: Medicare HMO

## 2020-11-05 ENCOUNTER — Inpatient Hospital Stay: Payer: Medicare HMO | Admitting: Certified Registered"

## 2020-11-05 ENCOUNTER — Encounter
Admission: EM | Disposition: A | Discharge status: Home / Self Care | Payer: Self-pay | Source: Home / Self Care | Attending: Internal Medicine

## 2020-11-05 ENCOUNTER — Inpatient Hospital Stay
Admission: EM | Admit: 2020-11-05 | Discharge: 2020-11-08 | DRG: 854 | Disposition: A | Discharge status: Home / Self Care | Payer: Medicare HMO | Attending: Internal Medicine | Admitting: Internal Medicine

## 2020-11-05 ENCOUNTER — Encounter: Payer: Self-pay | Admitting: Intensive Care

## 2020-11-05 ENCOUNTER — Other Ambulatory Visit: Payer: Self-pay

## 2020-11-05 DIAGNOSIS — N201 Calculus of ureter: Secondary | ICD-10-CM | POA: Diagnosis not present

## 2020-11-05 DIAGNOSIS — Z7982 Long term (current) use of aspirin: Secondary | ICD-10-CM

## 2020-11-05 DIAGNOSIS — Z9221 Personal history of antineoplastic chemotherapy: Secondary | ICD-10-CM

## 2020-11-05 DIAGNOSIS — Z7984 Long term (current) use of oral hypoglycemic drugs: Secondary | ICD-10-CM | POA: Diagnosis not present

## 2020-11-05 DIAGNOSIS — Z20822 Contact with and (suspected) exposure to covid-19: Secondary | ICD-10-CM | POA: Diagnosis not present

## 2020-11-05 DIAGNOSIS — R6520 Severe sepsis without septic shock: Secondary | ICD-10-CM | POA: Diagnosis not present

## 2020-11-05 DIAGNOSIS — B964 Proteus (mirabilis) (morganii) as the cause of diseases classified elsewhere: Secondary | ICD-10-CM | POA: Diagnosis present

## 2020-11-05 DIAGNOSIS — K219 Gastro-esophageal reflux disease without esophagitis: Secondary | ICD-10-CM | POA: Diagnosis present

## 2020-11-05 DIAGNOSIS — I959 Hypotension, unspecified: Secondary | ICD-10-CM | POA: Diagnosis not present

## 2020-11-05 DIAGNOSIS — E7800 Pure hypercholesterolemia, unspecified: Secondary | ICD-10-CM | POA: Diagnosis not present

## 2020-11-05 DIAGNOSIS — Z8673 Personal history of transient ischemic attack (TIA), and cerebral infarction without residual deficits: Secondary | ICD-10-CM | POA: Insufficient documentation

## 2020-11-05 DIAGNOSIS — A419 Sepsis, unspecified organism: Secondary | ICD-10-CM | POA: Diagnosis not present

## 2020-11-05 DIAGNOSIS — E785 Hyperlipidemia, unspecified: Secondary | ICD-10-CM | POA: Diagnosis present

## 2020-11-05 DIAGNOSIS — Z66 Do not resuscitate: Secondary | ICD-10-CM | POA: Diagnosis present

## 2020-11-05 DIAGNOSIS — N136 Pyonephrosis: Secondary | ICD-10-CM | POA: Diagnosis not present

## 2020-11-05 DIAGNOSIS — Z923 Personal history of irradiation: Secondary | ICD-10-CM | POA: Diagnosis not present

## 2020-11-05 DIAGNOSIS — K760 Fatty (change of) liver, not elsewhere classified: Secondary | ICD-10-CM | POA: Diagnosis not present

## 2020-11-05 DIAGNOSIS — R55 Syncope and collapse: Secondary | ICD-10-CM | POA: Diagnosis present

## 2020-11-05 DIAGNOSIS — Z853 Personal history of malignant neoplasm of breast: Secondary | ICD-10-CM | POA: Diagnosis not present

## 2020-11-05 DIAGNOSIS — Z960 Presence of urogenital implants: Secondary | ICD-10-CM | POA: Diagnosis not present

## 2020-11-05 DIAGNOSIS — E119 Type 2 diabetes mellitus without complications: Secondary | ICD-10-CM

## 2020-11-05 DIAGNOSIS — Z9049 Acquired absence of other specified parts of digestive tract: Secondary | ICD-10-CM | POA: Diagnosis not present

## 2020-11-05 DIAGNOSIS — I69354 Hemiplegia and hemiparesis following cerebral infarction affecting left non-dominant side: Secondary | ICD-10-CM | POA: Diagnosis not present

## 2020-11-05 DIAGNOSIS — R42 Dizziness and giddiness: Secondary | ICD-10-CM | POA: Diagnosis present

## 2020-11-05 DIAGNOSIS — K449 Diaphragmatic hernia without obstruction or gangrene: Secondary | ICD-10-CM | POA: Diagnosis not present

## 2020-11-05 DIAGNOSIS — R569 Unspecified convulsions: Secondary | ICD-10-CM | POA: Diagnosis not present

## 2020-11-05 DIAGNOSIS — E118 Type 2 diabetes mellitus with unspecified complications: Secondary | ICD-10-CM

## 2020-11-05 DIAGNOSIS — N132 Hydronephrosis with renal and ureteral calculous obstruction: Secondary | ICD-10-CM

## 2020-11-05 DIAGNOSIS — Z1629 Resistance to other single specified antibiotic: Secondary | ICD-10-CM | POA: Diagnosis not present

## 2020-11-05 DIAGNOSIS — I10 Essential (primary) hypertension: Secondary | ICD-10-CM | POA: Diagnosis not present

## 2020-11-05 DIAGNOSIS — N390 Urinary tract infection, site not specified: Secondary | ICD-10-CM | POA: Diagnosis not present

## 2020-11-05 DIAGNOSIS — Z79899 Other long term (current) drug therapy: Secondary | ICD-10-CM

## 2020-11-05 DIAGNOSIS — R197 Diarrhea, unspecified: Secondary | ICD-10-CM | POA: Diagnosis present

## 2020-11-05 DIAGNOSIS — Z466 Encounter for fitting and adjustment of urinary device: Secondary | ICD-10-CM | POA: Diagnosis not present

## 2020-11-05 DIAGNOSIS — N179 Acute kidney failure, unspecified: Secondary | ICD-10-CM

## 2020-11-05 DIAGNOSIS — Z87442 Personal history of urinary calculi: Secondary | ICD-10-CM

## 2020-11-05 HISTORY — PX: CYSTOSCOPY WITH STENT PLACEMENT: SHX5790

## 2020-11-05 LAB — URINALYSIS, COMPLETE (UACMP) WITH MICROSCOPIC
Bacteria, UA: NONE SEEN
Bilirubin Urine: NEGATIVE
Glucose, UA: NEGATIVE mg/dL
Ketones, ur: 5 mg/dL — AB
Nitrite: POSITIVE — AB
Protein, ur: NEGATIVE mg/dL
RBC / HPF: >50 RBC/hpf — ABNORMAL HIGH (ref 0–5)
Specific Gravity, Urine: 1.039 — ABNORMAL HIGH (ref 1.005–1.030)
WBC, UA: >50 WBC/hpf — ABNORMAL HIGH (ref 0–5)
pH: 8 (ref 5.0–8.0)

## 2020-11-05 LAB — COMPREHENSIVE METABOLIC PANEL
ALT: 15 U/L (ref 0–44)
AST: 18 U/L (ref 15–41)
Albumin: 4.4 g/dL (ref 3.5–5.0)
Alkaline Phosphatase: 52 U/L (ref 38–126)
Anion gap: 13 (ref 5–15)
BUN: 15 mg/dL (ref 8–23)
CO2: 26 mmol/L (ref 22–32)
Calcium: 9.5 mg/dL (ref 8.9–10.3)
Chloride: 95 mmol/L — ABNORMAL LOW (ref 98–111)
Creatinine, Ser: 1.07 mg/dL — ABNORMAL HIGH (ref 0.44–1.00)
GFR, Estimated: 53 mL/min — ABNORMAL LOW (ref >60)
Glucose, Bld: 170 mg/dL — ABNORMAL HIGH (ref 70–99)
Potassium: 3.5 mmol/L (ref 3.5–5.1)
Sodium: 134 mmol/L — ABNORMAL LOW (ref 135–145)
Total Bilirubin: 0.9 mg/dL (ref 0.3–1.2)
Total Protein: 7.9 g/dL (ref 6.5–8.1)

## 2020-11-05 LAB — CBC
HCT: 38.9 % (ref 36.0–46.0)
Hemoglobin: 13.2 g/dL (ref 12.0–15.0)
MCH: 30.3 pg (ref 26.0–34.0)
MCHC: 33.9 g/dL (ref 30.0–36.0)
MCV: 89.4 fL (ref 80.0–100.0)
Platelets: 237 10*3/uL (ref 150–400)
RBC: 4.35 MIL/uL (ref 3.87–5.11)
RDW: 12.1 % (ref 11.5–15.5)
WBC: 11.9 10*3/uL — ABNORMAL HIGH (ref 4.0–10.5)
nRBC: 0 % (ref 0.0–0.2)

## 2020-11-05 LAB — APTT: aPTT: 28 seconds (ref 24–36)

## 2020-11-05 LAB — LACTIC ACID, PLASMA
Lactic Acid, Venous: 2.3 mmol/L (ref 0.5–1.9)
Lactic Acid, Venous: 2.9 mmol/L (ref 0.5–1.9)

## 2020-11-05 LAB — TROPONIN I (HIGH SENSITIVITY)
Troponin I (High Sensitivity): 7 ng/L (ref <18)
Troponin I (High Sensitivity): 8 ng/L (ref <18)

## 2020-11-05 LAB — CBG MONITORING, ED: Glucose-Capillary: 167 mg/dL — ABNORMAL HIGH (ref 70–99)

## 2020-11-05 LAB — LIPASE, BLOOD: Lipase: 39 U/L (ref 11–51)

## 2020-11-05 LAB — PROTIME-INR
INR: 1 (ref 0.8–1.2)
Prothrombin Time: 13.2 seconds (ref 11.4–15.2)

## 2020-11-05 LAB — RESP PANEL BY RT-PCR (FLU A&B, COVID) ARPGX2
Influenza A by PCR: NEGATIVE
Influenza B by PCR: NEGATIVE
SARS Coronavirus 2 by RT PCR: NEGATIVE

## 2020-11-05 SURGERY — CYSTOSCOPY, WITH STENT INSERTION
Anesthesia: General | Site: Ureter | Laterality: Left

## 2020-11-05 MED ORDER — SUCCINYLCHOLINE CHLORIDE 200 MG/10ML IV SOSY
PREFILLED_SYRINGE | INTRAVENOUS | Status: AC
  Filled 2020-11-05: qty 10

## 2020-11-05 MED ORDER — LIDOCAINE HCL (PF) 2 % IJ SOLN
INTRAMUSCULAR | Status: AC
  Filled 2020-11-05: qty 5

## 2020-11-05 MED ORDER — SODIUM CHLORIDE 0.9 % IV SOLN
1.0000 g | INTRAVENOUS | Status: DC
  Administered 2020-11-06 – 2020-11-07 (×2): 1 g via INTRAVENOUS
  Filled 2020-11-05: qty 10
  Filled 2020-11-05: qty 1
  Filled 2020-11-05: qty 10

## 2020-11-05 MED ORDER — ACETAMINOPHEN 650 MG RE SUPP: 650.0000 mg | Freq: Four times a day (QID) | RECTAL | Status: DC | PRN

## 2020-11-05 MED ORDER — ONDANSETRON HCL 4 MG/2ML IJ SOLN
INTRAMUSCULAR | Status: AC
  Filled 2020-11-05: qty 2

## 2020-11-05 MED ORDER — HYDROCODONE-ACETAMINOPHEN 5-325 MG PO TABS: 1.0000 | ORAL_TABLET | ORAL | Status: DC | PRN

## 2020-11-05 MED ORDER — SODIUM CHLORIDE 0.9 % IV SOLN
1.0000 g | Freq: Once | INTRAVENOUS | Status: AC
  Administered 2020-11-05: 1 g via INTRAVENOUS
  Filled 2020-11-05: qty 10

## 2020-11-05 MED ORDER — DEXAMETHASONE SODIUM PHOSPHATE 10 MG/ML IJ SOLN
INTRAMUSCULAR | Status: DC | PRN
  Administered 2020-11-05: 10 mg via INTRAVENOUS

## 2020-11-05 MED ORDER — DEXAMETHASONE SODIUM PHOSPHATE 10 MG/ML IJ SOLN
INTRAMUSCULAR | Status: AC
  Filled 2020-11-05: qty 1

## 2020-11-05 MED ORDER — IOHEXOL 300 MG/ML  SOLN
100.0000 mL | Freq: Once | INTRAMUSCULAR | Status: AC | PRN
  Administered 2020-11-05: 100 mL via INTRAVENOUS

## 2020-11-05 MED ORDER — FENTANYL CITRATE (PF) 100 MCG/2ML IJ SOLN
INTRAMUSCULAR | Status: AC
  Filled 2020-11-05: qty 2

## 2020-11-05 MED ORDER — ONDANSETRON HCL 4 MG/2ML IJ SOLN: 4.0000 mg | Freq: Four times a day (QID) | INTRAMUSCULAR | Status: DC | PRN

## 2020-11-05 MED ORDER — MORPHINE SULFATE (PF) 2 MG/ML IV SOLN: 2.0000 mg | INTRAVENOUS | Status: DC | PRN

## 2020-11-05 MED ORDER — SUCCINYLCHOLINE CHLORIDE 20 MG/ML IJ SOLN
INTRAMUSCULAR | Status: DC | PRN
  Administered 2020-11-05: 100 mg via INTRAVENOUS

## 2020-11-05 MED ORDER — LACTATED RINGERS IV BOLUS
1000.0000 mL | Freq: Once | INTRAVENOUS | Status: AC
  Administered 2020-11-05: 1000 mL via INTRAVENOUS

## 2020-11-05 MED ORDER — FENTANYL CITRATE (PF) 100 MCG/2ML IJ SOLN
INTRAMUSCULAR | Status: DC | PRN
  Administered 2020-11-05 (×2): 50 ug via INTRAVENOUS

## 2020-11-05 MED ORDER — PROPOFOL 10 MG/ML IV BOLUS
INTRAVENOUS | Status: DC | PRN
  Administered 2020-11-05: 90 mg via INTRAVENOUS

## 2020-11-05 MED ORDER — INSULIN ASPART 100 UNIT/ML ~~LOC~~ SOLN: 0.0000 [IU] | Freq: Every day | SUBCUTANEOUS | Status: DC

## 2020-11-05 MED ORDER — INSULIN ASPART 100 UNIT/ML ~~LOC~~ SOLN
0.0000 [IU] | Freq: Three times a day (TID) | SUBCUTANEOUS | Status: DC
  Administered 2020-11-06 (×2): 5 [IU] via SUBCUTANEOUS
  Administered 2020-11-06: 2 [IU] via SUBCUTANEOUS
  Administered 2020-11-07 (×2): 3 [IU] via SUBCUTANEOUS
  Administered 2020-11-08: 5 [IU] via SUBCUTANEOUS
  Filled 2020-11-05 (×6): qty 1

## 2020-11-05 MED ORDER — PHENYLEPHRINE HCL (PRESSORS) 10 MG/ML IV SOLN
INTRAVENOUS | Status: DC | PRN
  Administered 2020-11-05 (×3): 100 ug via INTRAVENOUS

## 2020-11-05 MED ORDER — ACETAMINOPHEN 325 MG PO TABS
650.0000 mg | ORAL_TABLET | Freq: Four times a day (QID) | ORAL | Status: DC | PRN
  Administered 2020-11-06 – 2020-11-07 (×2): 650 mg via ORAL
  Filled 2020-11-05 (×2): qty 2

## 2020-11-05 MED ORDER — ONDANSETRON HCL 4 MG PO TABS: 4.0000 mg | ORAL_TABLET | Freq: Four times a day (QID) | ORAL | Status: DC | PRN

## 2020-11-05 MED ORDER — LIDOCAINE HCL (CARDIAC) PF 100 MG/5ML IV SOSY
PREFILLED_SYRINGE | INTRAVENOUS | Status: DC | PRN
  Administered 2020-11-05: 50 mg via INTRAVENOUS

## 2020-11-05 MED ORDER — PROPOFOL 10 MG/ML IV BOLUS
INTRAVENOUS | Status: AC
  Filled 2020-11-05: qty 20

## 2020-11-05 MED ORDER — ONDANSETRON HCL 4 MG/2ML IJ SOLN
INTRAMUSCULAR | Status: DC | PRN
  Administered 2020-11-05: 4 mg via INTRAVENOUS

## 2020-11-05 MED ORDER — ENOXAPARIN SODIUM 60 MG/0.6ML ~~LOC~~ SOLN
0.5000 mg/kg | SUBCUTANEOUS | Status: DC
  Administered 2020-11-06 – 2020-11-08 (×3): 45 mg via SUBCUTANEOUS
  Filled 2020-11-05 (×3): qty 0.6

## 2020-11-05 MED ORDER — SODIUM CHLORIDE 0.9 % IV SOLN: INTRAVENOUS | Status: DC

## 2020-11-05 SURGICAL SUPPLY — 18 items
BAG DRAIN CYSTO-URO LG1000N (MISCELLANEOUS) ×2 IMPLANT
BAG URINE DRAIN 2000ML AR STRL (UROLOGICAL SUPPLIES) ×2 IMPLANT
BRUSH SCRUB EZ 1% IODOPHOR (MISCELLANEOUS) ×2 IMPLANT
CATH SILICONE 16FRX5CC (CATHETERS) ×2 IMPLANT
CATH URETL 5X70 OPEN END (CATHETERS) ×2 IMPLANT
GLOVE SURG UNDER POLY LF SZ7.5 (GLOVE) ×4 IMPLANT
GOWN STRL REUS W/ TWL XL LVL3 (GOWN DISPOSABLE) ×2 IMPLANT
GOWN STRL REUS W/TWL XL LVL3 (GOWN DISPOSABLE) ×2
GUIDEWIRE STR DUAL SENSOR (WIRE) ×2 IMPLANT
IV NS IRRIG 3000ML ARTHROMATIC (IV SOLUTION) ×2 IMPLANT
KIT TURNOVER CYSTO (KITS) ×2 IMPLANT
MANIFOLD NEPTUNE II (INSTRUMENTS) ×2 IMPLANT
PACK CYSTO AR (MISCELLANEOUS) ×2 IMPLANT
SET CYSTO W/LG BORE CLAMP LF (SET/KITS/TRAYS/PACK) ×2 IMPLANT
STENT URET 6FRX24 CONTOUR (STENTS) ×2 IMPLANT
STENT URET 6FRX26 CONTOUR (STENTS) IMPLANT
SURGILUBE 2OZ TUBE FLIPTOP (MISCELLANEOUS) ×2 IMPLANT
WATER STERILE IRR 1000ML POUR (IV SOLUTION) ×2 IMPLANT

## 2020-11-05 NOTE — ED Notes (Signed)
Pt taken to OR at this time.

## 2020-11-05 NOTE — ED Notes (Signed)
Pt in hospital gown, hospital socks applied.

## 2020-11-05 NOTE — ED Notes (Signed)
Report given to OR- OR states they will arrange transport

## 2020-11-05 NOTE — Anesthesia Preprocedure Evaluation (Signed)
Anesthesia Evaluation  Patient identified by MRN, date of birth, ID band Patient awake    Reviewed: Allergy & Precautions, NPO status , Patient's Chart, lab work & pertinent test results  History of Anesthesia Complications Negative for: history of anesthetic complications  Airway Mallampati: II       Dental  (+) Upper Dentures, Lower Dentures   Pulmonary neg pulmonary ROS,    Pulmonary exam normal        Cardiovascular Exercise Tolerance: Good hypertension, Pt. on home beta blockers + Peripheral Vascular Disease   Rhythm:Regular Rate:Normal     Neuro/Psych PSYCHIATRIC DISORDERS Anxiety Depression  Neuromuscular disease CVA    GI/Hepatic Neg liver ROS, GERD  ,  Endo/Other  diabetes, Type 2, Oral Hypoglycemic AgentsMorbid obesity  Renal/GU Renal disease     Musculoskeletal negative musculoskeletal ROS (+)   Abdominal Normal abdominal exam  (+) + obese,   Peds negative pediatric ROS (+)  Hematology negative hematology ROS (+)   Anesthesia Other Findings Past Medical History: 2013: Breast cancer ([place])     Comment:  LEFT breast with chemo and rad tx 2017: Breast cancer ([place])     Comment:  right breast with rad tx 09/21/2015: Breast cancer of upper-inner quadrant of right female  breast ([place])     Comment:  T1bN0 " 69m; ER100%, PR 90%, HER-2/neu not               overexpressed. No date: Cystitis No date: Diabetes mellitus without complication ([facility])     Comment:  NO MEDS-DIET CONTROLLED No date: GERD (gastroesophageal reflux disease) 2011: Hernia No date: Hyperlipidemia No date: Hypertension 08/17/2011: Malignant neoplasm of upper-inner quadrant of female  breast ([place]     Comment:  Left, T2 (2.3 cm) N0, triple negative. Chemotherapy/post              wide excision whole breast radiation. 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 2013: Personal history of chemotherapy      Comment:  LEFT BREAST No date: Personal history of malignant neoplasm of breast 2017: Personal history of radiation therapy     Comment:   Rt, 2013 had on left No date: Vertigo  Reproductive/Obstetrics                             Anesthesia Physical  Anesthesia Plan  ASA: III  Anesthesia Plan: General   Post-op Pain Management:    Induction: Intravenous  PONV Risk Score and Plan:   Airway Management Planned: Oral ETT  Additional Equipment:   Intra-op Plan:   Post-operative Plan: Extubation in OR  Informed Consent: I have reviewed the patients History and Physical, chart, labs and discussed the procedure including the risks, benefits and alternatives for the proposed anesthesia with the patient or authorized representative who has indicated his/her understanding and acceptance.       Plan Discussed with: CRNA and Surgeon  Anesthesia Plan Comments:         Anesthesia Quick Evaluation

## 2020-11-05 NOTE — ED Notes (Signed)
Called lab for add-on troponin

## 2020-11-05 NOTE — Progress Notes (Signed)
Anticoagulation monitoring(Lovenox):  80 yo  female ordered Lovenox 40 mg Q24h  Filed Weights   11/05/20 1746  Weight: 90.7 kg (200 lb)   BMI 32.28    Lab Results  Component Value Date   CREATININE 1.07 (H) 11/05/2020   CREATININE 0.78 09/05/2020   CREATININE 0.69 06/23/2020   Estimated Creatinine Clearance: 49.2 mL/min (A) (by C-G formula based on SCr of 1.07 mg/dL (H)). Hemoglobin & Hematocrit     Component Value Date/Time   HGB 13.2 11/05/2020 1749   HGB 14.7 05/31/2020 1430   HCT 38.9 11/05/2020 1749   HCT 43.0 05/31/2020 1430     Per Protocol for Patient with estCrcl > 30 ml/min and BMI > 30, will transition to Lovenox 45 mg Q24h.

## 2020-11-05 NOTE — Consult Note (Signed)
Urology Consult  Requesting physician: Merlyn Lot, MD  Chief Complaint: Abdominal pain  History of Present Illness: Leah Schaefer is a 80 y.o. female who presented to the ED with complaints of nausea and generalized abdominal pain which subsequently radiated to the left the lower quadrant.  Her symptoms started on 3/29.  She had intermittent episodes of vomiting at home but denied fever or chills.  She had a syncopal episode in the waiting room and was unresponsive for a short period.  Urinalysis was nitrite positive with >50 WBC/RBC; lactate elevated 2.3 and leukocytosis 11.9  CT abdomen pelvis with contrast showed a delayed left nephrogram with a 10 mm left UPJ stone and moderate hydronephrosis there were additional small, nonobstructing left renal calculi.  No prior history stone disease  Temp in the ED was 100.1.  She has been started on IV antibiotics and will be admitted to the hospital service   Past Medical History:  Diagnosis Date  . Breast cancer ([facility]) 2013   LEFT breast with chemo and rad tx  . Breast cancer ([place]) 2017   right breast with rad tx  . 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  . Cystitis   . Diabetes mellitus without complication ([facility])    NO MEDS-DIET CONTROLLED  . GERD (gastroesophageal reflux disease)   . Hernia 2011  . Hyperlipidemia   . Hypertension   . Malignant neoplasm of upper-inner quadrant of female breast ([facility] 08/17/2011   Left, T2 (2.3 cm) N0, triple negative. Chemotherapy/post wide excision whole breast radiation.  . 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 Personal history of chemotherapy 2013   LEFT BREAST  . Personal history of malignant neoplasm of breast   . Personal history of radiation therapy 2017    Rt, 2013 had on left  . Vertigo     Past Surgical History:  Procedure Laterality Date   . BREAST BIOPSY Left 07/18/2011   +  . BREAST BIOPSY Left 09/21/2015   neg  . BREAST BIOPSY Left 04/05/2016   neg  . BREAST BIOPSY Left 03/20/2019   uKoreabx 2 areas /fat necrosis at 9:30 ribbon and 5:30 coil  . BREAST EXCISIONAL BIOPSY Right 08/2015   [facility] 09/30[facility]2015   [facility] DC[facility]/CLEAR MARGINS, NEGATIVE LN  . BREAST LUMPECTOMY Left 08/17/2011  . BREAST LUMPECTOMY WITH SENTINEL LYMPH NODE BIOPSY Right 09/30/2015   Procedure: BREAST LUMPECTOMY WITH SENTINEL LYMPH NODE BX;  Surgeon: JRobert Bellow MD;  Location: [facility] ORS;  Service: General;  Laterality: Right;  . BREAST SURGERY Left 2012   wide excision  . BREAST SURGERY Left November 2013   Core biopsy of the upper-outer quadrant showed fat necrosis.  . CHOLECYSTECTOMY  2007  . COLONOSCOPY  2011   Dr OCandace Cruise . COLONOSCOPY WITH PROPOFOL N/A 02/25/2015   Procedure: COLONOSCOPY WITH PROPOFOL;  Surgeon: PHulen Luster MD;  [facility];  Service: Gastroenterology;  Laterality: N/A;  . DILATION AND CURETTAGE OF UTERUS  2004  . HERNIA REPAIR Right 001/[id number]/[id number]  Umbilical/ventral hernia repaired with 6.4 cm Proceed ventral patch  . PORT-A-CATH REMOVAL    . PORTACATH PLACEMENT  2013  . RECURRENT HERNIA N/A 01/07/2008   Recurrent ventral hernia at the umbilicus, laparoscopy, open placement of a large Ultra Pro mesh with trans-fascial  sutures.  Marland Kitchen UPPER GI ENDOSCOPY  2011    Home Medications:  No outpatient medications have been marked as taking for the 11/05/20 encounter [facility] Encounter).    Allergies: No Known Allergies  Family History  Problem Relation Age of Onset  . Lymphoma Father   . Ovarian cancer Sister   . Colon cancer Brother   . Lymphoma Brother   . Cancer Other        stomach  . Cancer Other        stomach  . Breast cancer Neg Hx     Social History:  reports that she has never smoked. She has never used smokeless tobacco. She reports that she does not  drink alcohol and does not use drugs.  ROS: A complete review of systems was performed.  All systems are negative except for pertinent findings as noted.  Physical Exam:  Vital signs in last 24 hours: Temp:  [100.1 F (37.8 C)] 100.1 F (37.8 C) (04/01 1742) Pulse Rate:  [95-115] 105 (04/01 2230) Resp:  [15-29] 26 (04/01 2230) BP: (86-141)/(55-118) 120/73 (04/01 2230) SpO2:  [94 %-100 %] 96 % (04/01 2230) Weight:  [90.7 kg] 90.7 kg (04/01 1746) Constitutional:  Alert, in moderate distress secondary to abdominal pain HEENT: [place] AT, moist mucus membranes.  Trachea midline, no masses Cardiovascular: Regular rate and rhythm, no clubbing, cyanosis, or edema. Respiratory: Normal respiratory effort, lungs clear bilaterally GI: Abdomen is soft, mild left lower quadrant tenderness, nondistended, no abdominal masses GU: No CVA tenderness Neurologic: Grossly intact, no focal deficits, moving all 4 extremities Psychiatric: Normal mood and affect   Laboratory Data:  Recent Labs    11/05/20 1749  WBC 11.9*  HGB 13.2  HCT 38.9   Recent Labs    11/05/20 1749  NA 134*  K 3.5  CL 95*  CO2 26  GLUCOSE 170*  BUN 15  CREATININE 1.07*  CALCIUM 9.5   Recent Labs    11/05/20 1832  INR 1.0   No results for input(s): LABURIN in the last 72 hours. Results for orders placed or performed during the hospital encounter of 11/05/20  Resp Panel by RT-PCR (Flu A&B, Covid) Nasopharyngeal Swab     Status: None   Collection Time: 11/05/20  6:32 PM   Specimen: Nasopharyngeal Swab; Nasopharyngeal(NP) swabs in vial transport medium  Result Value Ref Range Status   SARS Coronavirus 2 by RT PCR NEGATIVE NEGATIVE Final    Comment: (NOTE) SARS-CoV-2 target nucleic acids are NOT DETECTED.  The SARS-CoV-2 RNA is generally detectable in upper respiratory specimens during the acute phase of infection. The lowest concentration of SARS-CoV-2 viral copies this assay can detect is 138 copies/mL. A negative  result does not preclude SARS-Cov-2 infection and should not be used as the sole basis for treatment or other patient management decisions. A negative result may occur with  improper specimen collection/handling, submission of specimen other than nasopharyngeal swab, presence of viral mutation(s) within the areas targeted by this assay, and inadequate number of viral copies(<138 copies/mL). A negative result must be combined with clinical observations, patient history, and epidemiological information. The expected result is Negative.  Fact Sheet for Patients:  EntrepreneurPulse.com.au  Fact Sheet for Healthcare Providers:  IncredibleEmployment.be  This test is no t yet approved or cleared by the [place] FDA and  has been authorized for detection and/or diagnosis of SARS-CoV-2 by FDA under an Emergency Use Authorization (EUA). This EUA will remain  in effect (meaning this test can  be used) for the duration of the COVID-19 declaration under Section 564(b)(1) of the Act, 21 U.S.C.section 360bbb-3(b)(1), unless the authorization is terminated  or revoked sooner.       Influenza A by PCR NEGATIVE NEGATIVE Final   Influenza B by PCR NEGATIVE NEGATIVE Final    Comment: (NOTE) The Xpert Xpress SARS-CoV-2/FLU/RSV plus assay is intended as an aid in the diagnosis of influenza from Nasopharyngeal swab specimens and should not be used as a sole basis for treatment. Nasal washings and aspirates are unacceptable for Xpert Xpress SARS-CoV-2/FLU/RSV testing.  Fact Sheet for Patients: EntrepreneurPulse.com.au  Fact Sheet for Healthcare Providers: IncredibleEmployment.be  This test is not yet approved or cleared by the [place] FDA and has been authorized for detection and/or diagnosis of SARS-CoV-2 by FDA under an Emergency Use Authorization (EUA). This EUA will remain in effect (meaning this test can be used)  for the duration of the COVID-19 declaration under Section 564(b)(1) of the Act, 21 U.S.C. section 360bbb-3(b)(1), unless the authorization is terminated or revoked.  Performed at [facility], [address] [place], [place] [address]      Radiologic Imaging: CT abdomen and pelvis with contrast was personally reviewed and interpreted.  On my review the stone measures 5 x 10 mm and is in the left proximal ureter.  CT Head Wo Contrast  Result Date: 11/05/2020 CLINICAL DATA:  Seizure emesis EXAM: CT HEAD WITHOUT CONTRAST TECHNIQUE: Contiguous axial images were obtained from the base of the skull through the vertex without intravenous contrast. COMPARISON:  MRI 09/06/2020, CT brain 09/05/2020 FINDINGS: Brain: No acute territorial infarction, hemorrhage or intracranial mass. 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 Chronic small vessel ischemic changes of the white matter. Stable ventricle size Vascular: No hyperdense vessels.  Carotid vascular calcification Skull: Normal. Negative for fracture or focal lesion. Sinuses/Orbits: No acute finding. Other: None IMPRESSION: 1. No CT evidence for acute intracranial abnormality. 2. Interval chronic infarct in the right frontal parietal subcortical white matter. 3. Atrophy and chronic small vessel ischemic changes of the white matter. Electronically Signed   By: Donavan Foil M.D.   On: 11/05/2020 19:45   CT ABDOMEN PELVIS W CONTRAST  Result Date: 11/05/2020 CLINICAL DATA:  Diarrhea. Left lower quadrant pain. Emesis. Abdominal abscess/infection suspected. EXAM: CT ABDOMEN AND PELVIS WITH CONTRAST TECHNIQUE: Multidetector CT imaging of the abdomen and pelvis was performed using the standard protocol following bolus administration of intravenous contrast. CONTRAST:  168m OMNIPAQUE IOHEXOL 300 MG/ML  SOLN COMPARISON:  CT abdomen and pelvis  07/27/2017. FINDINGS: Lower chest: Scarring at the right lung base is stable. Lungs are otherwise clear. Heart size is normal. Vascular calcifications noted coronary arteries. Hepatobiliary: Diffuse fatty infiltration liver noted. No focal lesions present. Surgical clips are present the gallbladder fossa. The common bile duct is within. Pancreas: Unremarkable. No pancreatic ductal dilatation or surrounding inflammatory changes. Spleen: Normal in size without focal abnormality. Adrenals/Urinary Tract: Adrenal glands are normal bilaterally. Right kidney in ureter is within limits. Obstructing 10 mm stone is present at the left UPJ. Moderate left-sided hydronephrosis is present. Delayed nephrogram evident. A punctate nonobstructing stone is present at the lower pole of the left kidney. 2 small stones are present at the lower pole of the left kidney. The larger measures 4 mm. The distal left ureter is within normal limits. Urinary bladder is normal. Stomach/Bowel: The stomach and duodenum are within normal limits.  Small bowel is unremarkable. Terminal ileum is normal. Appendix is surgically absent. The ascending and transverse colon mostly collapsed. Descending colon is unremarkable. Minimal diverticular changes are present in the sigmoid colon without inflammation. Vascular/Lymphatic: Atherosclerotic calcifications are present in the aorta and branch vessels. No aneurysm is present. No significant retroperitoneal adenopathy is present. Reproductive: Uterus and bilateral adnexa are unremarkable. Other: No abdominal wall hernia or abnormality. No abdominopelvic ascites. Musculoskeletal: Multilevel degenerative changes are again noted within the lumbar spine. Focal rightward curvature is present at L3-4. Degenerative changes are present at the SI joints. No focal lytic or blastic lesions are present. No acute healing fractures are present. IMPRESSION: 1. Obstructing 10 mm stone at the left UPJ with moderate left-sided  hydronephrosis and delayed nephrogram. 2. Two additional nonobstructing stones at the lower pole of the left kidney. 3. Hepatic steatosis. 4. Cholecystectomy and appendectomy. 5. Multilevel degenerative changes in the lumbar spine. 6. Aortic Atherosclerosis (ICD10-I70.0). Electronically Signed   By: San Morelle M.D.   On: 11/05/2020 19:45   DG Chest Port 1 View  Result Date: 11/05/2020 CLINICAL DATA:  Not responsive EXAM: PORTABLE CHEST 1 VIEW COMPARISON:  09/04/2011 FINDINGS: No focal consolidation or pleural effusion. Curvilinear opacity at the cardiac base could be secondary to faint mitral annular calcification or potentially hiatal hernia. Normal heart size. No pneumothorax. IMPRESSION: No active disease. Electronically Signed   By: Donavan Foil M.D.   On: 11/05/2020 19:14    Impression/Assessment:   80 y.o. female with a 10 mm obstructing left proximal ureteral calculus with pyuria, leukocytosis and low-grade fever  Plan:   Recommend urgent cystoscopy with placement of a left ureteral stent  The procedure was discussed in detail including potential risks of bleeding, worsening infection/sepsis and ureteral injury.  It was stressed no attempts at stone removal will be made due to infection and she will need follow-up treatment after her infection has been treated/resolved.  The possibility of stent irritative symptoms were discussed.  The possible need for percutaneous nephrostomy tube was also discussed in the event the stone is impacted and the stent cannot be placed.  She indicated all questions were answered and desires to proceed.    11/05/2020, 10:43 PM  John Giovanni,  MD

## 2020-11-05 NOTE — ED Triage Notes (Signed)
Patient reports diarrhea and LLQ abdominal pain since Tuesday. Reports 1 episode of emesis today

## 2020-11-05 NOTE — H&P (Signed)
History and Physical    Leah Schaefer VHQ:[id number] DOB: [birth year] DOA: 11/05/2020  PCP: Jerrol Banana., MD   Patient coming from: Home   I have personally briefly reviewed patient's old medical records in [place]  Chief Complaint: Abdominal pain and vomiting  HPI: Leah Schaefer is a 80 y.o. 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  It is of severe intensity, unrelieved with OTC pain meds.  It is now associated with nausea and vomiting and had a fever of 101.  Denies dysuria or change in bowel habits.  Has had no cough or chest pain.  While awaiting triage in the emergency room patient had a passing out episode, without a fall or hurting herself and was awake and alert by the time they brought her back to the examination room ED course: On arrival, temp 100.1, BP 118/84, pulse 116 with O2 sat 100% on room air.  At the time of presyncopal event BP 86/55, pulse 96 respirations 28 with O2 sat 96% on room air.  Blood work revealed WBC 12,000 with lactic acid 2.3>2.9.  Urinalysis with pyuria, large leukocytes and positive nitrites.  Creatinine 1.07 up from baseline of 0.69.  Lipase 39.  Troponin normal at 7/8 EKG personally reviewed and interpreted: NSR at 92 with nonspecific ST-T wave changes Imaging: CT abdomen and pelvis: Obstructing 10 mm stone at the left UPJ with moderate left-sided hydronephrosis with other nonacute findings CT head no acute intracranial abnormality Chest x-ray no active disease  Patient was given an IV fluid bolus with improvement in blood pressure to 120/73 by admission though she remained tachycardic and tachypneic.  Started on IV Rocephin for sepsis secondary to UTI.  The ED provider spoke with urologist Dr. Bernardo Heater who will take  patient emergently to the OR for stent placement.  Hospitalist consulted for admission.   Review of Systems: As per HPI otherwise all other systems on review of systems negative.    Past Medical History:  Diagnosis Date  . Breast cancer ([place]) 2013   LEFT breast with chemo and rad tx  . Breast cancer ([place]) 2017   right breast with rad tx  . Breast cancer of upper-inner quadrant of right female breast ([place]) 09/21/2015   T1bN0 " 57m; ER100%, PR 90%, HER-2/neu not overexpressed.  . Cystitis   . Diabetes mellitus without complication (HCC)    NO MEDS-DIET CONTROLLED  . GERD (gastroesophageal reflux disease)   . Hernia 2011  . Hyperlipidemia   . Hypertension   . Malignant neoplasm of upper-inner quadrant of female breast ([place] 08/17/2011   Left, T2 (2.3 cm) N0, triple negative. Chemotherapy/post wide excision whole breast radiation.  . 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 Personal history of chemotherapy 2013   LEFT BREAST  . Personal history of malignant neoplasm of breast   . Personal history of radiation therapy 2017    Rt, 2013 had on left  . Vertigo     Past Surgical History:  Procedure Laterality Date  . BREAST BIOPSY Left 07/18/2011   +  . BREAST BIOPSY Left 09/21/2015   neg  . BREAST BIOPSY Left 04/05/2016   neg  . BREAST BIOPSY Left 03/20/2019   uKoreabx 2 areas /  fat necrosis at 9:30 ribbon and 5:30 coil  . BREAST EXCISIONAL BIOPSY Right 08/2015   [place]  . BREAST LUMPECTOMY Right 09/30/2015   [facility] WITH MICROPAPILLARY FEATURES AND DCIS/CLEAR MARGINS, NEGATIVE LN  . BREAST LUMPECTOMY Left 08/17/2011  . BREAST LUMPECTOMY WITH SENTINEL LYMPH NODE BIOPSY Right 09/30/2015   Procedure: BREAST LUMPECTOMY WITH SENTINEL LYMPH NODE BX;  Surgeon: Robert Bellow, MD;  Location: [facility] ORS;  Service: General;  Laterality: Right;  . BREAST SURGERY Left 2012   wide excision  . BREAST SURGERY Left November 2013   Core biopsy of the upper-outer  quadrant showed fat necrosis.  . CHOLECYSTECTOMY  2007  . COLONOSCOPY  2011   Dr Candace Cruise  . COLONOSCOPY WITH PROPOFOL N/A 02/25/2015   Procedure: COLONOSCOPY WITH PROPOFOL;  Surgeon: Hulen Luster, MD;  Location: [facility] ENDOSCOPY;  Service: Gastroenterology;  Laterality: N/A;  . DILATION AND CURETTAGE OF UTERUS  2004  . HERNIA REPAIR Right 26/71/2458   Umbilical/ventral hernia repaired with 6.4 cm Proceed ventral patch  . PORT-A-CATH REMOVAL    . PORTACATH PLACEMENT  2013  . 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  [facility] Kitchen UPPER GI ENDOSCOPY  2011     reports that she has never smoked. She has never used smokeless tobacco. She reports that she does not drink alcohol and does not use drugs.  No Known Allergies  Family History  Problem Relation Age of Onset  . Lymphoma Father   . Ovarian cancer Sister   . Colon cancer Brother   . Lymphoma Brother   . Cancer Other        stomach  . Cancer Other        stomach  . Breast cancer Neg Hx       Prior to Admission medications   Medication Sig Start Date End Date Taking? Authorizing Provider  acetaminophen (TYLENOL) 500 MG tablet Take 500 mg by mouth every 6 (six) hours as needed for mild pain or headache.     [provider]  aspirin EC 81 MG tablet Take 1 tablet (81 mg total) by mouth daily. Swallow whole. Take for [redacted] weeks along with plavix and then stop aspirin but continue the plavix. 09/07/20   Wyvonnia Dusky, MD  atorvastatin (LIPITOR) 40 MG tablet TAKE 1 TABLET EVERY EVENING 07/24/20   Jerrol Banana., MD  bimatoprost (LUMIGAN) 0.01 % SOLN Place 1 drop into both eyes at bedtime.     [provider]  Calcium Carb-Cholecalciferol 600-800 MG-UNIT TABS Take 1 tablet by mouth 2 (two) times daily.    [provider]  chlorthalidone (HYGROTON) 25 MG tablet Take 1 tablet (25 mg total) by mouth daily.  08/05/20   Jerrol Banana., MD  cholecalciferol (VITAMIN D) 1000 units tablet Take 1,000 Units by mouth daily.    [provider]  cyanocobalamin 500 MCG tablet Take 1,000 mcg by mouth daily.     [provider]  gabapentin (NEURONTIN) 300 MG capsule TAKE 2 CAPSULES AT BEDTIME 03/16/20   Jerrol Banana., MD  latanoprost (XALATAN) 0.005 % ophthalmic solution Place 1 drop into both eyes at bedtime.  10/11/16   [provider]  letrozole (FEMARA) 2.5 MG tablet TAKE 1 TABLET (2.5 MG TOTAL) BY MOUTH DAILY. 10/06/20   Jerrol Banana., MD  losartan (COZAAR) 50 MG tablet Take 1 tablet (50  mg total) by mouth daily. 05/03/20   Jerrol Banana., MD  meclizine (ANTIVERT) 25 MG tablet Take 0.5 tablets (12.5 mg total) by mouth 3 (three) times daily as needed for dizziness. 10/27/15   Hinda Kehr, MD  metFORMIN (GLUCOPHAGE) 1000 MG tablet Take 1 tablet (1,000 mg total) by mouth 2 (two) times daily with a meal. 04/07/20   Jerrol Banana., MD  metoprolol tartrate (LOPRESSOR) 25 MG tablet TAKE 1/2 TABLET (12.5 MG TOTAL) TWICE DAILY 07/24/20   Jerrol Banana., MD  Multiple Vitamin (MULTIVITAMIN WITH MINERALS) TABS tablet Take 1 tablet by mouth daily.    [provider]  omega-3 acid ethyl esters (LOVAZA) 1 g capsule Take 1 g by mouth daily.     [provider]  pantoprazole (PROTONIX) 40 MG tablet TAKE 1 TABLET TWICE DAILY 08/16/20   Jerrol Banana., MD  pyridoxine (B-6) 100 MG tablet Take 100 mg by mouth daily.    [provider]  sertraline (ZOLOFT) 100 MG tablet Take 0.5 tablets (50 mg total) by mouth daily. Doctor increased dose to 142m 05/2020  but patient reports that she never increased the dose. She continued taking 569mdaily. 09/13/20   GiJerrol Banana MD  sertraline (ZOLOFT) 50 MG tablet Take 1.5 tablets (75 mg total) by mouth daily. Patient not taking: No sig reported 06/09/20   Flinchum, MiKelby AlineFNP   vitamin C (ASCORBIC ACID) 500 MG tablet Take 1,000 mg by mouth daily.    [provider]  vitamin E 400 UNIT capsule Take 400 Units by mouth daily.    [provider]    Physical Exam: Vitals:   11/05/20 2115 11/05/20 2150 11/05/20 2215 11/05/20 2230  BP:  (!) 101/59  120/73  Pulse: (!) 104 (!) 106 (!) 106 (!) 105  Resp: 19 (!) 27 (!) 27 (!) 26  Temp:      TempSrc:      SpO2: 100% 95% 94% 96%  Weight:      Height:         Vitals:   11/05/20 2115 11/05/20 2150 11/05/20 2215 11/05/20 2230  BP:  (!) 101/59  120/73  Pulse: (!) 104 (!) 106 (!) 106 (!) 105  Resp: 19 (!) 27 (!) 27 (!) 26  Temp:      TempSrc:      SpO2: 100% 95% 94% 96%  Weight:      Height:          Constitutional:  Elderly, ill-appearing female, oriented x 3 . Not in any apparent distress HEENT:      Head: Normocephalic and atraumatic.         Eyes: PERLA, EOMI, Conjunctivae are normal. Sclera is non-icteric.       Mouth/Throat: Mucous membranes are moist.       Neck: Supple with no signs of meningismus. Cardiovascular: Regular rate and rhythm. No murmurs, gallops, or rubs. 2+ symmetrical distal pulses are present . No JVD. No LE edema Respiratory: Respiratory effort normal .Lungs sounds clear bilaterally. No wheezes, crackles, or rhonchi.  Gastrointestinal: Soft, tender in LLQ, non distended with positive bowel sounds.  Genitourinary: No CVA tenderness. Musculoskeletal: Nontender with normal range of motion in all extremities. No cyanosis, or erythema of extremities. Neurologic:  Face is symmetric. Moving all extremities. No gross focal neurologic deficits . Skin: Skin is warm, dry.  No rash or ulcers Psychiatric: Mood and affect are normal    Labs on Admission:  I have personally reviewed following labs and imaging studies  CBC: Recent Labs  Lab 11/05/20 1749  WBC 11.9*  HGB 13.2  HCT 38.9  MCV 89.4  PLT 798   Basic Metabolic Panel: Recent Labs  Lab 11/05/20 1749  NA 134*   K 3.5  CL 95*  CO2 26  GLUCOSE 170*  BUN 15  CREATININE 1.07*  CALCIUM 9.5   GFR: Estimated Creatinine Clearance: 49.2 mL/min (A) (by C-G formula based on SCr of 1.07 mg/dL (H)). Liver Function Tests: Recent Labs  Lab 11/05/20 1749  AST 18  ALT 15  ALKPHOS 52  BILITOT 0.9  PROT 7.9  ALBUMIN 4.4   Recent Labs  Lab 11/05/20 1749  LIPASE 39   No results for input(s): AMMONIA in the last 168 hours. Coagulation Profile: Recent Labs  Lab 11/05/20 1832  INR 1.0   Cardiac Enzymes: No results for input(s): CKTOTAL, CKMB, CKMBINDEX, TROPONINI in the last 168 hours. BNP (last 3 results) No results for input(s): PROBNP in the last 8760 hours. HbA1C: No results for input(s): HGBA1C in the last 72 hours. CBG: Recent Labs  Lab 11/05/20 1804  GLUCAP 167*   Lipid Profile: No results for input(s): CHOL, HDL, LDLCALC, TRIG, CHOLHDL, LDLDIRECT in the last 72 hours. Thyroid Function Tests: No results for input(s): TSH, T4TOTAL, FREET4, T3FREE, THYROIDAB in the last 72 hours. Anemia Panel: No results for input(s): VITAMINB12, FOLATE, FERRITIN, TIBC, IRON, RETICCTPCT in the last 72 hours. Urine analysis:    Component Value Date/Time   COLORURINE YELLOW (A) 11/05/2020 1832   APPEARANCEUR HAZY (A) 11/05/2020 1832   APPEARANCEUR Clear 03/08/2012 2240   LABSPEC 1.039 (H) 11/05/2020 1832   LABSPEC 1.012 03/08/2012 2240   PHURINE 8.0 11/05/2020 1832   GLUCOSEU NEGATIVE 11/05/2020 1832   GLUCOSEU Negative 03/08/2012 2240   HGBUR LARGE (A) 11/05/2020 1832   BILIRUBINUR NEGATIVE 11/05/2020 1832   BILIRUBINUR negative 06/09/2020 1352   BILIRUBINUR Negative 03/08/2012 2240   KETONESUR 5 (A) 11/05/2020 1832   PROTEINUR NEGATIVE 11/05/2020 1832   UROBILINOGEN 0.2 06/09/2020 1352   NITRITE POSITIVE (A) 11/05/2020 1832   LEUKOCYTESUR LARGE (A) 11/05/2020 1832   LEUKOCYTESUR 1+ 03/08/2012 2240    Radiological Exams on Admission: CT Head Wo Contrast  Result Date:  11/05/2020 CLINICAL DATA:  Seizure emesis EXAM: CT HEAD WITHOUT CONTRAST TECHNIQUE: Contiguous axial images were obtained from the base of the skull through the vertex without intravenous contrast. COMPARISON:  MRI 09/06/2020, CT brain 09/05/2020 FINDINGS: Brain: No acute territorial infarction, hemorrhage or intracranial mass. 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 Chronic small vessel ischemic changes of the white matter. Stable ventricle size Vascular: No hyperdense vessels.  Carotid vascular calcification Skull: Normal. Negative for fracture or focal lesion. Sinuses/Orbits: No acute finding. Other: None IMPRESSION: 1. No CT evidence for acute intracranial abnormality. 2. Interval chronic infarct in the right frontal parietal subcortical white matter. 3. Atrophy and chronic small vessel ischemic changes of the white matter. Electronically Signed   By: Donavan Foil M.D.   On: 11/05/2020 19:45   CT ABDOMEN PELVIS W CONTRAST  Result Date: 11/05/2020 CLINICAL DATA:  Diarrhea. Left lower quadrant pain. Emesis. Abdominal abscess/infection suspected. EXAM: CT ABDOMEN AND PELVIS WITH CONTRAST TECHNIQUE: Multidetector CT imaging of the abdomen and pelvis was performed using the standard protocol following bolus administration of intravenous contrast. CONTRAST:  171m OMNIPAQUE IOHEXOL 300 MG/ML  SOLN COMPARISON:  CT abdomen and pelvis 07/27/2017. FINDINGS: Lower chest: Scarring at the right lung base is stable. Lungs are otherwise clear. Heart size is normal. Vascular calcifications noted coronary arteries. Hepatobiliary: Diffuse fatty infiltration liver noted. No focal lesions present. Surgical clips are present the gallbladder fossa. The common bile duct is within. Pancreas: Unremarkable. No pancreatic ductal dilatation or surrounding inflammatory changes. Spleen: Normal in size without focal abnormality.  Adrenals/Urinary Tract: Adrenal glands are normal bilaterally. Right kidney in ureter is within limits. Obstructing 10 mm stone is present at the left UPJ. Moderate left-sided hydronephrosis is present. Delayed nephrogram evident. A punctate nonobstructing stone is present at the lower pole of the left kidney. 2 small stones are present at the lower pole of the left kidney. The larger measures 4 mm. The distal left ureter is within normal limits. Urinary bladder is normal. Stomach/Bowel: The stomach and duodenum are within normal limits. Small bowel is unremarkable. Terminal ileum is normal. Appendix is surgically absent. The ascending and transverse colon mostly collapsed. Descending colon is unremarkable. Minimal diverticular changes are present in the sigmoid colon without inflammation. Vascular/Lymphatic: Atherosclerotic calcifications are present in the aorta and branch vessels. No aneurysm is present. No significant retroperitoneal adenopathy is present. Reproductive: Uterus and bilateral adnexa are unremarkable. Other: No abdominal wall hernia or abnormality. No abdominopelvic ascites. Musculoskeletal: Multilevel degenerative changes are again noted within the lumbar spine. Focal rightward curvature is present at L3-4. Degenerative changes are present at the SI joints. No focal lytic or blastic lesions are present. No acute healing fractures are present. IMPRESSION: 1. Obstructing 10 mm stone at the left UPJ with moderate left-sided hydronephrosis and delayed nephrogram. 2. Two additional nonobstructing stones at the lower pole of the left kidney. 3. Hepatic steatosis. 4. Cholecystectomy and appendectomy. 5. Multilevel degenerative changes in the lumbar spine. 6. Aortic Atherosclerosis (ICD10-I70.0). Electronically Signed   By: San Morelle M.D.   On: 11/05/2020 19:45   DG Chest Port 1 View  Result Date: 11/05/2020 CLINICAL DATA:  Not responsive EXAM: PORTABLE CHEST 1 VIEW COMPARISON:  09/04/2011  FINDINGS: No focal consolidation or pleural effusion. Curvilinear opacity at the cardiac base could be secondary to faint mitral annular calcification or potentially hiatal hernia. Normal heart size. No pneumothorax. IMPRESSION: No active disease. Electronically Signed   By: Donavan Foil M.D.   On: 11/05/2020 19:14     Assessment/Plan 80 year old female with history of DM, HTN, history of breast cancer on letrozole, hemiparesis secondary to CVA in 2/ 2022, presenting with left-sided abdominal pain, vomiting and fever.      Severe sepsis (HCC)   Complicated UTI (urinary tract infection)   Hydronephrosis with renal and ureteral calculus obstruction -Severe sepsis criteria includes fever, tachycardia and tachypnea, fluid responsive hypotension, leukocytosis, elevated lactic acid and AKI, and abnormal UA with obstructing stone on CT -CT abdomen and pelvis: Obstructing 10 mm stone at the left UPJ with moderate left-sided hydronephrosis with other nonacute findings -Dr. Bernardo Heater taken patient emergently to the OR -Continue IV fluid resuscitation -Continue Rocephin    AKI (acute kidney injury) ([place]) -Creatinine 1.07, up from baseline of 0.69. -Hydronephrosis due to obstructing 10 mm calculus left UPJ -Likely both pre and post renal as outlined above -For emergent stent placement -Continue IV hydration -Monitor renal function and avoid nephrotoxins    Postural dizziness with presyncope -Patient had an episode of near syncope while awaiting evaluation in the waiting room with BP 86/55, improving with IV fluid bolus -Suspect related to hypotension related to  sepsis -Neurologic checks -CT head with no acute intracranial findings, -Low suspicion for acute CVA as no additional focal deficits beyond her baseline and episode was a brief    Diabetes mellitus, type 2 (HCC) -Sliding scale insulin coverage    History of breast cancer -Continue letrozole pending med rec    Hemiparesis affecting left  side as late effect of stroke [facility]) -Had a stroke in February 2022 -Ambulant with walker -Continue aspirin and statins pending    DVT prophylaxis: Lovenox  Code Status: DNR, discussed with patient with daughter at bedside Family Communication: Daughter at bedside Disposition Plan: Back to previous home environment Consults called: Urology, Dr.Stoioff Status:At the time of admission, it appears that the appropriate admission status for this patient is INPATIENT. This is judged to be reasonable and necessary in order to provide the required intensity of service to ensure the patient's safety given the presenting symptoms, physical exam findings, and initial radiographic and laboratory data in the context of their  Comorbid conditions.   Patient requires inpatient status due to high intensity of service, high risk for further deterioration and high frequency of surveillance required.   I certify that at the point of admission it is my clinical judgment that the patient will require inpatient hospital care spanning beyond [place] MD [facility]     11/05/2020, 11:19 PM

## 2020-11-05 NOTE — ED Provider Notes (Signed)
[facility] Emergency Department Provider Note    Event Date/Time   First MD Initiated Contact with Patient 11/05/20 1815     (approximate)  I have reviewed the triage vital signs and the nursing notes.   HISTORY  Chief Complaint Abdominal Pain and Diarrhea    HPI Leah Schaefer is a 80 y.o. female the below listed past medical history presents to the ER due to nausea and was admitted initially generalized abdominal pain and now radiating to left lower quadrant.  Has had some vomiting at home.  Denies any dysuria.  While in the waiting room patient had syncopal episode with generalized shaking spell.  Was unresponsive for short period of time this occurred just that she was getting up to go give a urine sample.  She was brought immediately back to room 6 was responsive at this time following commands.  No history of seizure.  Denies any chest pain or pressure.    Past Medical History:  Diagnosis Date  . Breast cancer ([place]) 2013   LEFT breast with chemo and rad tx  . Breast cancer ([place]) 2017   right breast with rad tx  . 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  . Cystitis   . Diabetes mellitus without complication (HCC)    NO MEDS-DIET CONTROLLED  . GERD (gastroesophageal reflux disease)   . Hernia 2011  . Hyperlipidemia   . Hypertension   . Malignant neoplasm of upper-inner quadrant of [place] breast [place] 08[place]17[place]   Left, T2 (2.3 cm) N0, triple negative. Chemotherapy/post wide excision whole breast radiation.  . 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 Personal history of chemotherapy 2013   LEFT BREAST  . Personal history of malignant neoplasm of breast   . Personal history of radiation therapy 2017    Rt, 2013 had on left  . Vertigo    Family History  Problem Relation Age of Onset  . Lymphoma Father   .  Ovarian cancer Sister   . Colon cancer Brother   . Lymphoma Brother   . Cancer Other        stomach  . Cancer Other        stomach  . Breast cancer Neg Hx    Past Surgical History:  Procedure Laterality Date  . BREAST BIOPSY Left 07/18/2011   +  . BREAST BIOPSY Left 09/21/2015   neg  . BREAST BIOPSY Left 04/05/2016   neg  . BREAST BIOPSY Left 03/20/2019   uKoreabx 2 areas /fat necrosis at 9:30 ribbon and 5:30 coil  . BREAST EXCISIONAL BIOPSY [place]/2015   [place] [facility] 09/30/2015   [facility], NEGATIVE LN  . BREAST LUMPECTOMY Left 08/17/2011  . BREAST LUMPECTOMY WITH SENTINEL LYMPH NODE BIOPSY Right 09/30/2015   Procedure: BREAST LUMPECTOMY WITH SENTINEL LYMPH NODE BX;  Surgeon: JRobert Bellow MD;  Location: [facility] ORS;  Service: General;  Laterality: Right;  . BREAST SURGERY Left 2012   wide excision  . BREAST SURGERY Left November 2013   Core biopsy of the upper-outer quadrant showed fat necrosis.  . CHOLECYSTECTOMY  2007  . COLONOSCOPY  2011   Dr OCandace Cruise . COLONOSCOPY WITH PROPOFOL N/A 02/25/2015   Procedure: COLONOSCOPY WITH PROPOFOL;  Surgeon: PHulen Luster MD;  [facility];  Service: Gastroenterology;  Laterality: N/A;  . DILATION AND CURETTAGE OF UTERUS  2004  . HERNIA REPAIR Right 24/58/0998   Umbilical/ventral hernia repaired with 6.4 cm Proceed ventral patch  . PORT-A-CATH REMOVAL    . PORTACATH PLACEMENT  2013  . 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  Marland Kitchen UPPER GI ENDOSCOPY  2011   Patient Active Problem List   Diagnosis Date Noted  . Severe sepsis ([place]) 11/05/2020  . Complicated UTI (urinary tract infection) 11/05/2020  . Hydronephrosis with renal and ureteral calculus obstruction 11/05/2020  . History of breast cancer 11/05/2020  . History of CVA (cerebrovascular  accident) 11/05/2020  . AKI (acute kidney injury) ([place]) 11/05/2020  . Hemiparesis affecting left side as late effect of stroke ([place]) 11/05/2020  . Postural dizziness with presyncope 11/05/2020  . Stroke ([place]) 09/06/2020  . Leukocytes in urine 06/09/2020  . Grief 06/09/2020  . Episode of recurrent major depressive disorder ([place]) 06/09/2020  . Urinary symptom or sign 06/09/2020  . Left sided numbness 11/06/2018  . 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 Umbilical pain 33/82/5053  . Breast mass, left 04/07/2016  . Controlled type 2 diabetes mellitus without complication ([place]) 97/67/3419  . Allergic rhinitis 03/30/2015  . Anxiety 03/30/2015  . Carotid arterial disease ([place]) 03/30/2015  . Diabetes mellitus, type 2 ([place]) 03/30/2015  . Essential (primary) hypertension 03/30/2015  . Hypercholesteremia 03/30/2015  . Left leg pain 03/30/2015  . LBP (low back pain) 03/30/2015  . Neuropathic pain 03/30/2015  . Burning or prickling sensation 03/30/2015  . Neuralgia neuritis, sciatic nerve 03/30/2015  . Skin cyst 11/24/2014  . Carcinoma of upper-outer quadrant of right breast in female, estrogen receptor positive ([place]) 08/17/2011  . Allergic reaction 11/29/2009  . Adaptation reaction 04/30/2009  . Acid reflux 04/30/2009  . Stricture and stenosis of cervix 01/28/2009      Prior to Admission medications   Medication Sig Start Date End Date Taking? Authorizing Provider  acetaminophen (TYLENOL) 500 MG tablet Take 500 mg by mouth every 6 (six) hours as needed for mild pain or headache.     [provider]  aspirin EC 81 MG tablet Take 1 tablet (81 mg total) by mouth daily. Swallow whole. Take for [redacted] weeks along with plavix and then stop aspirin but continue the plavix. 09/07/20   Wyvonnia Dusky, MD  atorvastatin (LIPITOR) 40 MG tablet TAKE 1 TABLET EVERY EVENING 07/24/20   Jerrol Banana., MD  bimatoprost (LUMIGAN) 0.01 % SOLN Place 1 drop into both eyes at bedtime.     [provider]  Calcium Carb-Cholecalciferol 600-800 MG-UNIT TABS Take 1 tablet by mouth 2 (two) times daily.    [provider]  chlorthalidone (HYGROTON) 25 MG tablet Take 1 tablet (25 mg total) by mouth daily. 08/05/20   Jerrol Banana., MD  cholecalciferol (VITAMIN D) 1000 units tablet Take 1,000 Units by mouth daily.    [provider]  cyanocobalamin 500 MCG tablet Take 1,000 mcg by mouth daily.     [provider]  gabapentin (NEURONTIN) 300 MG capsule TAKE 2 CAPSULES AT BEDTIME 03/16/20   Jerrol Banana., MD  latanoprost (XALATAN) 0.005 % ophthalmic solution Place 1 drop into both eyes at bedtime.  10/11/16   [provider]  letrozole (FEMARA) 2.5 MG tablet TAKE 1 TABLET (2.5 MG TOTAL) BY MOUTH DAILY. 10/06/20  Jerrol Banana., MD  losartan (COZAAR) 50 MG tablet Take 1 tablet (50 mg total) by mouth daily. 05/03/20   Jerrol Banana., MD  meclizine (ANTIVERT) 25 MG tablet Take 0.5 tablets (12.5 mg total) by mouth 3 (three) times daily as needed for dizziness. 10/27/15   Hinda Kehr, MD  metFORMIN (GLUCOPHAGE) 1000 MG tablet Take 1 tablet (1,000 mg total) by mouth 2 (two) times daily with a meal. 04/07/20   Jerrol Banana., MD  metoprolol tartrate (LOPRESSOR) 25 MG tablet TAKE 1/2 TABLET (12.5 MG TOTAL) TWICE DAILY 07/24/20   Jerrol Banana., MD  Multiple Vitamin (MULTIVITAMIN WITH MINERALS) TABS tablet Take 1 tablet by mouth daily.    [provider]  omega-3 acid ethyl esters (LOVAZA) 1 g capsule Take 1 g by mouth daily.     [provider]  pantoprazole (PROTONIX) 40 MG tablet TAKE 1 TABLET TWICE DAILY 08/16/20   Jerrol Banana., MD  pyridoxine (B-6) 100 MG tablet Take 100 mg by mouth daily.    [provider]  sertraline (ZOLOFT) 100 MG tablet Take 0.5 tablets (50 mg total) by mouth daily. Doctor increased dose to 117m 05/2020  but patient reports that she never increased the dose. She  continued taking 518mdaily. 09/13/20   GiJerrol Banana MD  sertraline (ZOLOFT) 50 MG tablet Take 1.5 tablets (75 mg total) by mouth daily. Patient not taking: No sig reported 06/09/20   Flinchum, MiKelby AlineFNP  vitamin C (ASCORBIC ACID) 500 MG tablet Take 1,000 mg by mouth daily.    [provider]  vitamin E 400 UNIT capsule Take 400 Units by mouth daily.    [provider]    Allergies Patient has no known allergies.    Social History Social History   Tobacco Use  . Smoking status: Never Smoker  . Smokeless tobacco: Never Used  Vaping Use  . Vaping Use: Never used  Substance Use Topics  . Alcohol use: No  . Drug use: No    Review of Systems Patient denies headaches, rhinorrhea, blurry vision, numbness, shortness of breath, chest pain, edema, cough, abdominal pain, nausea, vomiting, diarrhea, dysuria, fevers, rashes or hallucinations unless otherwise stated above in HPI. ____________________________________________   PHYSICAL EXAM:  VITAL SIGNS: Vitals:   11/05/20 2215 11/05/20 2230  BP:  120/73  Pulse: (!) 106 (!) 105  Resp: (!) 27 (!) 26  Temp:    SpO2: 94% 96%    Constitutional: Alert and oriented.  Eyes: Conjunctivae are normal.  Head: Atraumatic. Nose: No congestion/rhinnorhea. Mouth/Throat: Mucous membranes are moist.   Neck: No stridor. Painless ROM.  Cardiovascular: Normal rate, regular rhythm. Grossly normal heart sounds.  Good peripheral circulation. Respiratory: Normal respiratory effort.  No retractions. Lungs CTAB. Gastrointestinal: Soft and nontender. No distention. No abdominal bruits.  Genitourinary: deferred Musculoskeletal: No lower extremity tenderness nor edema.  No joint effusions. Neurologic:  Normal speech and language. No gross focal neurologic deficits are appreciated. No facial droop Skin:  Skin is warm, dry and intact. No rash noted. Psychiatric: Mood and affect are normal. Speech and behavior are  normal.  ____________________________________________   LABS (all labs ordered are listed, but only abnormal results are displayed)  Results for orders placed or performed during the hospital encounter of 11/05/20 (from the past 24 hour(s))  Lipase, blood     Status: None   Collection Time: 11/05/20  5:49 PM  Result Value Ref Range  Lipase 39 11 - 51 U/L  Comprehensive metabolic panel     Status: Abnormal   Collection Time: 11/05/20  5:49 PM  Result Value Ref Range   Sodium 134 (L) 135 - 145 mmol/L   Potassium 3.5 3.5 - 5.1 mmol/L   Chloride 95 (L) 98 - 111 mmol/L   CO2 26 22 - 32 mmol/L   Glucose, Bld 170 (H) 70 - 99 mg/dL   BUN 15 8 - 23 mg/dL   Creatinine, Ser 1.07 (H) 0.44 - 1.00 mg/dL   Calcium 9.5 8.9 - 10.3 mg/dL   Total Protein 7.9 6.5 - 8.1 g/dL   Albumin 4.4 3.5 - 5.0 g/dL   AST 18 15 - 41 U/L   ALT 15 0 - 44 U/L   Alkaline Phosphatase 52 38 - 126 U/L   Total Bilirubin 0.9 0.3 - 1.2 mg/dL   GFR, Estimated 53 (L) >60 mL/min   Anion gap 13 5 - 15  CBC     Status: Abnormal   Collection Time: 11/05/20  5:49 PM  Result Value Ref Range   WBC 11.9 (H) 4.0 - 10.5 K/uL   RBC 4.35 3.87 - 5.11 MIL/uL   Hemoglobin 13.2 12.0 - 15.0 g/dL   HCT 38.9 36.0 - 46.0 %   MCV 89.4 80.0 - 100.0 fL   MCH 30.3 26.0 - 34.0 pg   MCHC 33.9 30.0 - 36.0 g/dL   RDW 12.1 11.5 - 15.5 %   Platelets 237 150 - 400 K/uL   nRBC 0.0 0.0 - 0.2 %  Troponin I (High Sensitivity)     Status: None   Collection Time: 11/05/20  5:49 PM  Result Value Ref Range   Troponin I (High Sensitivity) 7 <18 ng/L  CBG monitoring, ED     Status: Abnormal   Collection Time: 11/05/20  6:04 PM  Result Value Ref Range   Glucose-Capillary 167 (H) 70 - 99 mg/dL  Urinalysis, Complete w Microscopic Urine, Catheterized     Status: Abnormal   Collection Time: 11/05/20  6:32 PM  Result Value Ref Range   Color, Urine YELLOW (A) YELLOW   APPearance HAZY (A) CLEAR   Specific Gravity, Urine 1.039 (H) 1.005 - 1.030   pH  8.0 5.0 - 8.0   Glucose, UA NEGATIVE NEGATIVE mg/dL   Hgb urine dipstick LARGE (A) NEGATIVE   Bilirubin Urine NEGATIVE NEGATIVE   Ketones, ur 5 (A) NEGATIVE mg/dL   Protein, ur NEGATIVE NEGATIVE mg/dL   Nitrite POSITIVE (A) NEGATIVE   Leukocytes,Ua LARGE (A) NEGATIVE   RBC / HPF >50 (H) 0 - 5 RBC/hpf   WBC, UA >50 (H) 0 - 5 WBC/hpf   Bacteria, UA NONE SEEN NONE SEEN   Squamous Epithelial / LPF 0-5 0 - 5   Mucus PRESENT   Lactic acid, plasma     Status: Abnormal   Collection Time: 11/05/20  6:32 PM  Result Value Ref Range   Lactic Acid, Venous 2.3 (HH) 0.5 - 1.9 mmol/L  Protime-INR     Status: None   Collection Time: 11/05/20  6:32 PM  Result Value Ref Range   Prothrombin Time 13.2 11.4 - 15.2 seconds   INR 1.0 0.8 - 1.2  APTT     Status: None   Collection Time: 11/05/20  6:32 PM  Result Value Ref Range   aPTT 28 24 - 36 seconds  Resp Panel by RT-PCR (Flu A&B, Covid) Nasopharyngeal Swab     Status: None  Collection Time: 11/05/20  6:32 PM   Specimen: Nasopharyngeal Swab; Nasopharyngeal(NP) swabs in vial transport medium  Result Value Ref Range   SARS Coronavirus 2 by RT PCR NEGATIVE NEGATIVE   Influenza A by PCR NEGATIVE NEGATIVE   Influenza B by PCR NEGATIVE NEGATIVE  Lactic acid, plasma     Status: Abnormal   Collection Time: 11/05/20  8:28 PM  Result Value Ref Range   Lactic Acid, Venous 2.9 (HH) 0.5 - 1.9 mmol/L  Troponin I (High Sensitivity)     Status: None   Collection Time: 11/05/20  9:26 PM  Result Value Ref Range   Troponin I (High Sensitivity) 8 <18 ng/L   ____________________________________________  EKG My review and personal interpretation at Time: 18:31   Indication: ams  Rate: 90  Rhythm: sinus Axis: normal Other: nonspecific st and t wave abn, no stemi criteria ____________________________________________  RADIOLOGY  I personally reviewed all radiographic images ordered to evaluate for the above acute complaints and reviewed radiology reports and  findings.  These findings were personally discussed with the patient.  Please see medical record for radiology report.  ____________________________________________   PROCEDURES  Procedure(s) performed:  Procedures    Critical Care performed: no ____________________________________________   INITIAL IMPRESSION / ASSESSMENT AND PLAN / ED COURSE  Pertinent labs & imaging results that were available during my care of the patient were reviewed by me and considered in my medical decision making (see chart for details).   DDX: Diverticulitis, appendectomy, UTI, seizure, electrolyte abnormality, sepsis, orthostasis, stone, pyelonephritis  Leah Schaefer is a 80 y.o. who presents to the ED with presentation as describing above.  Patient with low-grade temperature tachycardic had syncopal episode it sounds like more than seizure episode.  She is mentating appropriately.  Will order CT imaging as well as blood work for above differential.  She is denying any chest pain or pressure.  Have a low suspicion for dysrhythmia.  Clinical Course as of 11/05/20 2339  Fri Nov 05, 2020  2034 Lactate elevation noted.  Reviewed imaging.  Noted that the patient had had some documented low blood pressures but was not made aware of this.  Have ordered IV antibiotics in anticipation of urinalysis that is still pending.  Concerning for obstructing stone possibly infected. [PR]  2126 Patient was initially refusing to give urine sample but finally consented to in and out cath [PR]    Clinical Course User Index [PR] Merlyn Lot, MD   Urinalysis did reveal bacteria.  I spoke with Dr. Bernardo Heater in consultation with urology.  Agrees with plan for taken to OR for stent placement.  Discussed case with hospitalist for admission. Have discussed with the patient and available family all diagnostics and treatments performed thus far and all questions were answered to the best of my ability. The patient demonstrates  understanding and agreement with plan.   The patient was evaluated in Emergency Department today for the symptoms described in the history of present illness. He/she was evaluated in the context of the global COVID-19 pandemic, which necessitated consideration that the patient might be at risk for infection with the SARS-CoV-2 virus that causes COVID-19. Institutional protocols and algorithms that pertain to the evaluation of patients at risk for COVID-19 are in a state of rapid change based on information released by regulatory bodies including the CDC and federal and state organizations. These policies and algorithms were followed during the patient's care in the ED.  As part of my medical decision making, I  reviewed the following data within the [facility] notes reviewed and incorporated, Labs reviewed, notes from prior ED visits and [place] Controlled Substance Database   ____________________________________________   FINAL CLINICAL IMPRESSION(S) / ED DIAGNOSES  Final diagnoses:  Sepsis, due to unspecified organism, unspecified whether acute organ dysfunction present ([place])  Ureterolithiasis      NEW MEDICATIONS STARTED DURING THIS VISIT:  Current Discharge Medication List       Note:  This document was prepared using Dragon voice recognition software and may include unintentional dictation errors.    Merlyn Lot, MD 11/05/20 [phone]

## 2020-11-05 NOTE — ED Notes (Addendum)
Called to lobby. PT daughter yelling that her mother is not responsive. Upon assessment, pt was not responsive, with head slumped. Pulled pt's mask down and pt was drooling. PT breathing. Took pt back to triage room to further examine and obtain vitals and suction. Charge nurse called for room. Almost immediately atfer getting in triage room pt became responsive. Able to tell me her name. Stopped drooling. CBG checked. PT taken to room 6 and changed out of soiled pants, placed on tele and seizure pad in place. Quentin Cornwall, MD made aware of pt.

## 2020-11-05 NOTE — Anesthesia Procedure Notes (Signed)
Procedure Name: Intubation Performed by: Rolla Plate, CRNA Pre-anesthesia Checklist: Patient identified, Patient being monitored, Timeout performed, Emergency Drugs available and Suction available Patient Re-evaluated:Patient Re-evaluated prior to induction Oxygen Delivery Method: Circle system utilized Preoxygenation: Pre-oxygenation with 100% oxygen Induction Type: IV induction and Rapid sequence Laryngoscope Size: 3 and McGraph Grade View: Grade I Tube type: Oral Tube size: 7.0 mm Number of attempts: 1 Airway Equipment and Method: Stylet and Video-laryngoscopy Placement Confirmation: ETT inserted through vocal cords under direct vision,  positive ETCO2 and breath sounds checked- equal and bilateral Secured at: 21 cm Tube secured with: Tape Dental Injury: Teeth and Oropharynx as per pre-operative assessment

## 2020-11-06 DIAGNOSIS — N390 Urinary tract infection, site not specified: Secondary | ICD-10-CM | POA: Diagnosis not present

## 2020-11-06 DIAGNOSIS — N179 Acute kidney failure, unspecified: Secondary | ICD-10-CM | POA: Diagnosis not present

## 2020-11-06 DIAGNOSIS — I69354 Hemiplegia and hemiparesis following cerebral infarction affecting left non-dominant side: Secondary | ICD-10-CM

## 2020-11-06 DIAGNOSIS — N201 Calculus of ureter: Secondary | ICD-10-CM

## 2020-11-06 DIAGNOSIS — N132 Hydronephrosis with renal and ureteral calculous obstruction: Secondary | ICD-10-CM

## 2020-11-06 DIAGNOSIS — A419 Sepsis, unspecified organism: Secondary | ICD-10-CM

## 2020-11-06 DIAGNOSIS — R6520 Severe sepsis without septic shock: Secondary | ICD-10-CM

## 2020-11-06 LAB — GLUCOSE, CAPILLARY
Glucose-Capillary: 228 mg/dL — ABNORMAL HIGH (ref 70–99)
Glucose-Capillary: 203 mg/dL — ABNORMAL HIGH (ref 70–99)
Glucose-Capillary: 132 mg/dL — ABNORMAL HIGH (ref 70–99)
Glucose-Capillary: 162 mg/dL — ABNORMAL HIGH (ref 70–99)

## 2020-11-06 LAB — CBG MONITORING, ED: Glucose-Capillary: 152 mg/dL — ABNORMAL HIGH (ref 70–99)

## 2020-11-06 LAB — CBC
HCT: 35.6 % — ABNORMAL LOW (ref 36.0–46.0)
Hemoglobin: 11.9 g/dL — ABNORMAL LOW (ref 12.0–15.0)
MCH: 30.1 pg (ref 26.0–34.0)
MCHC: 33.4 g/dL (ref 30.0–36.0)
MCV: 90.1 fL (ref 80.0–100.0)
Platelets: 213 10*3/uL (ref 150–400)
RBC: 3.95 MIL/uL (ref 3.87–5.11)
RDW: 12.2 % (ref 11.5–15.5)
WBC: 16.4 10*3/uL — ABNORMAL HIGH (ref 4.0–10.5)
nRBC: 0 % (ref 0.0–0.2)

## 2020-11-06 LAB — BASIC METABOLIC PANEL
Anion gap: 11 (ref 5–15)
BUN: 16 mg/dL (ref 8–23)
CO2: 25 mmol/L (ref 22–32)
Calcium: 8.9 mg/dL (ref 8.9–10.3)
Chloride: 99 mmol/L (ref 98–111)
Creatinine, Ser: 1.22 mg/dL — ABNORMAL HIGH (ref 0.44–1.00)
GFR, Estimated: 45 mL/min — ABNORMAL LOW (ref >60)
Glucose, Bld: 196 mg/dL — ABNORMAL HIGH (ref 70–99)
Potassium: 3.8 mmol/L (ref 3.5–5.1)
Sodium: 135 mmol/L (ref 135–145)

## 2020-11-06 LAB — PROTIME-INR
INR: 1.1 (ref 0.8–1.2)
Prothrombin Time: 13.9 seconds (ref 11.4–15.2)

## 2020-11-06 LAB — PROCALCITONIN: Procalcitonin: 1.08 ng/mL

## 2020-11-06 LAB — CORTISOL-AM, BLOOD: Cortisol - AM: 25.6 ug/dL — ABNORMAL HIGH (ref 6.7–22.6)

## 2020-11-06 MED ORDER — SERTRALINE HCL 50 MG PO TABS
50.0000 mg | ORAL_TABLET | Freq: Every day | ORAL | Status: DC
  Administered 2020-11-06 – 2020-11-08 (×3): 50 mg via ORAL
  Filled 2020-11-06 (×3): qty 1

## 2020-11-06 MED ORDER — ONDANSETRON HCL 4 MG/2ML IJ SOLN: 4.0000 mg | Freq: Once | INTRAMUSCULAR | Status: DC | PRN

## 2020-11-06 MED ORDER — FENTANYL CITRATE (PF) 100 MCG/2ML IJ SOLN
25.0000 ug | INTRAMUSCULAR | Status: DC | PRN
Start: 2020-11-06 — End: 2020-11-06

## 2020-11-06 MED ORDER — ATORVASTATIN CALCIUM 20 MG PO TABS
40.0000 mg | ORAL_TABLET | Freq: Every day | ORAL | Status: DC
  Administered 2020-11-06 – 2020-11-07 (×2): 40 mg via ORAL
  Filled 2020-11-06 (×2): qty 2

## 2020-11-06 MED ORDER — METOPROLOL TARTRATE 25 MG PO TABS
12.5000 mg | ORAL_TABLET | Freq: Two times a day (BID) | ORAL | Status: DC
  Administered 2020-11-06 – 2020-11-08 (×4): 12.5 mg via ORAL
  Filled 2020-11-06 (×4): qty 1

## 2020-11-06 NOTE — Progress Notes (Signed)
PROGRESS NOTE  EMMERSEN GARRAWAY WLN:[id number] DOB: 17-Nov-1940 DOA: 11/05/2020 PCP: Jerrol Banana., MD   LOS: 1 day   Brief Narrative / Interim history: 80 year old female with history of type 2 diabetes mellitus, essential hypertension, prior history of breast counseled on letrozole, left-sided hemiparesis secondary to her stroke couple of months ago but fairly active and ambulatory leaving somewhat independently with family close by presents to the hospital with complaints of left lower quadrant and left flank pain.  This is progressive, severe, associated with nausea and vomiting and also she was febrile up to 101 at home.  She was brought to the hospital and initial evaluation revealed obstructive 10 mm left UPJ stone with moderate left-sided hydronephrosis.  Urology was consulted and she was taken to the OR emergently overnight and is now status post left ureteral stent placement.  Subjective / 24h Interval events: She feels much better this morning, she is able to eat breakfast, no longer has nausea or vomiting.  Her pains have resolved.  Assessment & Plan: Principal Problem Severe sepsis, complicated UTI, hydronephrosis with renal ureter calculus obstruction-patient was admitted to the hospital with acute obstructive stone on the left side.  Urology consulted.  To the OR emergently on 4/2 and she is now status post left ureteral stent placement.  She has been placed on ceftriaxone.   -Sepsis physiology has improved this morning, continue ceftriaxone while awaiting cultures -Her intermittent hypotension seems to have resolved this morning, she is eating and drinking well, decrease the rate of fluids -She is afebrile -Remove Foley per urology within 24-48 hours.  Active Problems Acute kidney injury -creatinine raised up to 1.22 from a baseline of 0.69.  This is likely in the setting of #1.  Continue IV fluids, recheck renal function in the morning  Postural dizziness with  presyncope -Patient had an episode of near syncope while awaiting evaluation in the waiting room with BP 86/55, improving with IV fluid bolus, suspect related to hypotension in the setting of sepsis.  Resolved.  Diabetes mellitus, type 2 (HCC) -Sliding scale insulin coverage  CBG (last 3)  Recent Labs    11/05/20 1804 11/06/20 0007 11/06/20 0738  GLUCAP 167* 152* 228*   History of breast cancer -Continue letrozole pending medication reconciliation  Hemiparesis affecting left side as late effect of stroke ([place]) -Had a stroke in February 2022, she is ambulating with a walker, able to live independently but daughter is next-door and helping throughout the day -Continue home medications  Scheduled Meds: . enoxaparin (LOVENOX) injection  0.5 mg/kg Subcutaneous Q24H  . insulin aspart  0-15 Units Subcutaneous TID WC  . insulin aspart  0-5 Units Subcutaneous QHS   Continuous Infusions: . sodium chloride 150 mL/hr at 11/06/20 0842  . cefTRIAXone (ROCEPHIN)  IV     PRN Meds:.acetaminophen **OR** acetaminophen, HYDROcodone-acetaminophen, morphine injection, ondansetron **OR** ondansetron (ZOFRAN) IV  Diet Orders (From admission, onward)    Start     Ordered   11/05/20 2316  Diet heart healthy/carb modified Room service appropriate? Yes; Fluid consistency: Thin  Diet effective now       Question Answer Comment  Diet-HS Snack? Nothing   Room service appropriate? Yes   Fluid consistency: Thin      11/05/20 2317          DVT prophylaxis:      Code Status: DNR  Family Communication: No family present at bedside  Status is: Inpatient  Remains inpatient appropriate because:Inpatient level of care appropriate  due to severity of illness   Dispo: The patient is from: Home              Anticipated d/c is to: Home              Patient currently is not medically stable to d/c.   Difficult to place patient No  Level of care: Progressive Cardiac  Consultants:  Urology    Procedures:  left ureteral stent placement 4/2  Microbiology  Pending   Antimicrobials: Ceftriaxone 4/1 >>    Objective: Vitals:   11/06/20 0106 11/06/20 0325 11/06/20 0737 11/06/20 1042  BP: (!) 97/59 115/64 (!) 101/59   Pulse: 97 93 84   Resp: 19 18 16    Temp: 99.2 F (37.3 C) 98.6 F (37 C) 97.9 F (36.6 C)   TempSrc:      SpO2: 97% 96% 96%   Weight: 86.4 kg   86 kg  Height:        Intake/Output Summary (Last 24 hours) at 11/06/2020 1115 Last data filed at 11/06/2020 0829 Gross per 24 hour  Intake 540 ml  Output 1425 ml  Net -885 ml   Filed Weights   11/05/20 1746 11/06/20 0106 11/06/20 1042  Weight: 90.7 kg 86.4 kg 86 kg    Examination:  Constitutional: NAD Eyes: no scleral icterus ENMT: Mucous membranes are moist.  Neck: normal, supple Respiratory: clear to auscultation bilaterally, no wheezing, no crackles. Normal respiratory effort.  Cardiovascular: Regular rate and rhythm, no murmurs / rubs / gallops. No LE edema. Good peripheral pulses Abdomen: non distended, no tenderness. Bowel sounds positive.  Musculoskeletal: no clubbing / cyanosis.  Skin: no rashes Neurologic: Nonfocal, 5 -/5 on the left, 5/5 Psychiatric: Normal judgment and insight. Alert and oriented x 3. Normal mood.    Data Reviewed: I have independently reviewed following labs and imaging studies   CBC: Recent Labs  Lab 11/05/20 1749 11/06/20 0125  WBC 11.9* 16.4*  HGB 13.2 11.9*  HCT 38.9 35.6*  MCV 89.4 90.1  PLT 237 161   Basic Metabolic Panel: Recent Labs  Lab 11/05/20 1749 11/06/20 0125  NA 134* 135  K 3.5 3.8  CL 95* 99  CO2 26 25  GLUCOSE 170* 196*  BUN 15 16  CREATININE 1.07* 1.22*  CALCIUM 9.5 8.9   Liver Function Tests: Recent Labs  Lab 11/05/20 1749  AST 18  ALT 15  ALKPHOS 52  BILITOT 0.9  PROT 7.9  ALBUMIN 4.4   Coagulation Profile: Recent Labs  Lab 11/05/20 1832 11/06/20 0125  INR 1.0 1.1   HbA1C: No results for input(s): HGBA1C in the  last 72 hours. CBG: Recent Labs  Lab 11/05/20 1804 11/06/20 0007 11/06/20 0738  GLUCAP 167* 152* 228*    Recent Results (from the past 240 hour(s))  Blood Culture (routine x 2)     Status: None (Preliminary result)   Collection Time: 11/05/20  6:32 PM   Specimen: BLOOD  Result Value Ref Range Status   Specimen Description BLOOD RIGHT ANTECUBITAL  Final   Special Requests   Final    BOTTLES DRAWN AEROBIC AND ANAEROBIC Blood Culture adequate volume   Culture   Final    NO GROWTH < 12 HOURS Performed at [facility], [address], [place],  [address]    Report Status PENDING  Incomplete  Blood Culture (routine x 2)     Status: None (Preliminary result)   Collection Time: 11/05/20  6:32 PM   Specimen:  BLOOD  Result Value Ref Range Status   Specimen Description BLOOD BLOOD RIGHT WRIST  Final   Special Requests   Final    BOTTLES DRAWN AEROBIC AND ANAEROBIC Blood Culture results may not be optimal due to an inadequate volume of blood received in culture bottles   Culture   Final    NO GROWTH < 12 HOURS Performed at [facility], [address], [place], [place] [address]    Report Status PENDING  Incomplete  Resp Panel by RT-PCR (Flu A&B, Covid) Nasopharyngeal Swab     Status: None   Collection Time: 11/05/20  6:32 PM   Specimen: Nasopharyngeal Swab; Nasopharyngeal(NP) swabs in vial transport medium  Result Value Ref Range Status   SARS Coronavirus 2 by RT PCR NEGATIVE NEGATIVE Final    Comment: (NOTE) SARS-CoV-2 target nucleic acids are NOT DETECTED.  The SARS-CoV-2 RNA is generally detectable in upper respiratory specimens during the acute phase of infection. The lowest concentration of SARS-CoV-2 viral copies this assay can detect is 138 copies/mL. A negative result does not preclude SARS-Cov-2 infection and should not be used as the sole basis for treatment or other patient management decisions. A negative result may occur with  improper  specimen collection/handling, submission of specimen other than nasopharyngeal swab, presence of viral mutation(s) within the areas targeted by this assay, and inadequate number of viral copies(<138 copies/mL). A negative result must be combined with clinical observations, patient history, and epidemiological information. The expected result is Negative.  Fact Sheet for Patients:  EntrepreneurPulse.com.au  Fact Sheet for Healthcare Providers:  IncredibleEmployment.be  This test is no t yet approved or cleared by the [place] FDA and  has been authorized for detection and/or diagnosis of SARS-CoV-2 by FDA under an Emergency Use Authorization (EUA). This EUA will remain  in effect (meaning this test can be used) for the duration of the COVID-19 declaration under Section 564(b)(1) of the Act, 21 U.S.C.section 360bbb-3(b)(1), unless the authorization is terminated  or revoked sooner.       Influenza A by PCR NEGATIVE NEGATIVE Final   Influenza B by PCR NEGATIVE NEGATIVE Final    Comment: (NOTE) The Xpert Xpress SARS-CoV-2/FLU/RSV plus assay is intended as an aid in the diagnosis of influenza from Nasopharyngeal swab specimens and should not be used as a sole basis for treatment. Nasal washings and aspirates are unacceptable for Xpert Xpress SARS-CoV-2/FLU/RSV testing.  Fact Sheet for Patients: EntrepreneurPulse.com.au  Fact Sheet for Healthcare Providers: IncredibleEmployment.be  This test is not yet approved or cleared by the [place] FDA and has been authorized for detection and/or diagnosis of SARS-CoV-2 by FDA under an Emergency Use Authorization (EUA). This EUA will remain in effect (meaning this test can be used) for the duration of the COVID-19 declaration under Section 564(b)(1) of the Act, 21 U.S.C. section 360bbb-3(b)(1), unless the authorization is terminated or revoked.  Performed at  [facility], [address] [place], [place] [address]   Blood culture (single)     Status: None (Preliminary result)   Collection Time: 11/06/20  1:25 AM   Specimen: BLOOD  Result Value Ref Range Status   Specimen Description BLOOD LEFT FOREARM  Final   Special Requests   Final    BOTTLES DRAWN AEROBIC AND ANAEROBIC Blood Culture adequate volume   Culture   Final    NO GROWTH < 12 HOURS Performed at [facility], [address], [place], [place] [address]    Report Status PENDING  Incomplete     Radiology Studies: CT Head Wo Contrast  Result Date: 11/05/2020 CLINICAL DATA:  Seizure emesis EXAM: CT HEAD WITHOUT CONTRAST TECHNIQUE: Contiguous axial images were obtained from the base of the skull through the vertex without intravenous contrast. COMPARISON:  MRI 09/06/2020, CT brain 09/05/2020 FINDINGS: Brain: No acute territorial infarction, hemorrhage or intracranial mass. 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 Chronic small vessel ischemic changes of the white matter. Stable ventricle size Vascular: No hyperdense vessels.  Carotid vascular calcification Skull: Normal. Negative for fracture or focal lesion. Sinuses/Orbits: No acute finding. Other: None IMPRESSION: 1. No CT evidence for acute intracranial abnormality. 2. Interval chronic infarct in the right frontal parietal subcortical white matter. 3. Atrophy and chronic small vessel ischemic changes of the white matter. Electronically Signed   By: Donavan Foil M.D.   On: 11/05/2020 19:45   CT ABDOMEN PELVIS W CONTRAST  Result Date: 11/05/2020 CLINICAL DATA:  Diarrhea. Left lower quadrant pain. Emesis. Abdominal abscess/infection suspected. EXAM: CT ABDOMEN AND PELVIS WITH CONTRAST TECHNIQUE: Multidetector CT imaging of the abdomen and pelvis was performed using the standard protocol following bolus  administration of intravenous contrast. CONTRAST:  111mL OMNIPAQUE IOHEXOL 300 MG/ML  SOLN COMPARISON:  CT abdomen and pelvis 07/27/2017. FINDINGS: Lower chest: Scarring at the right lung base is stable. Lungs are otherwise clear. Heart size is normal. Vascular calcifications noted coronary arteries. Hepatobiliary: Diffuse fatty infiltration liver noted. No focal lesions present. Surgical clips are present the gallbladder fossa. The common bile duct is within. Pancreas: Unremarkable. No pancreatic ductal dilatation or surrounding inflammatory changes. Spleen: Normal in size without focal abnormality. Adrenals/Urinary Tract: Adrenal glands are normal bilaterally. Right kidney in ureter is within limits. Obstructing 10 mm stone is present at the left UPJ. Moderate left-sided hydronephrosis is present. Delayed nephrogram evident. A punctate nonobstructing stone is present at the lower pole of the left kidney. 2 small stones are present at the lower pole of the left kidney. The larger measures 4 mm. The distal left ureter is within normal limits. Urinary bladder is normal. Stomach/Bowel: The stomach and duodenum are within normal limits. Small bowel is unremarkable. Terminal ileum is normal. Appendix is surgically absent. The ascending and transverse colon mostly collapsed. Descending colon is unremarkable. Minimal diverticular changes are present in the sigmoid colon without inflammation. Vascular/Lymphatic: Atherosclerotic calcifications are present in the aorta and branch vessels. No aneurysm is present. No significant retroperitoneal adenopathy is present. Reproductive: Uterus and bilateral adnexa are unremarkable. Other: No abdominal wall hernia or abnormality. No abdominopelvic ascites. Musculoskeletal: Multilevel degenerative changes are again noted within the lumbar spine. Focal rightward curvature is present at L3-4. Degenerative changes are present at the SI joints. No focal lytic or blastic lesions are  present. No acute healing fractures are present. IMPRESSION: 1. Obstructing 10 mm stone at the left UPJ with moderate left-sided hydronephrosis and delayed nephrogram. 2. Two additional nonobstructing stones at the lower pole of the left kidney. 3. Hepatic steatosis. 4. Cholecystectomy and appendectomy. 5. Multilevel degenerative changes in the lumbar spine. 6. Aortic Atherosclerosis (ICD10-I70.0). Electronically Signed   By: San Morelle M.D.   On: 11/05/2020 19:45   DG Chest Port 1 View  Result Date: 11/05/2020 CLINICAL DATA:  Not responsive EXAM: PORTABLE CHEST 1 VIEW COMPARISON:  09/04/2011 FINDINGS: No focal consolidation or pleural effusion. Curvilinear opacity at the cardiac base could be secondary to faint mitral annular calcification  or potentially hiatal hernia. Normal heart size. No pneumothorax. IMPRESSION: No active disease. Electronically Signed   By: Donavan Foil M.D.   On: 11/05/2020 19:14   Marzetta Board, MD, PhD [facility]  Between 7 am - 7 pm I am available, please contact me via Amion (for emergencies) or Securechat (non urgent messages)  Between 7 pm - 7 am I am not available, please contact night coverage MD/APP via Amion

## 2020-11-06 NOTE — Op Note (Signed)
Preoperative diagnosis:  1. Obstructing left proximal ureteral calculus 2. Sepsis from a urinary source  Postoperative diagnosis:  1. Same  Procedure:  1. Cystoscopy 2. Left ureteral stent placement (6FR/24 cm)  Surgeon: Nicki Reaper C. Shayn Madole, M.D.  Anesthesia: General  Complications: None  Intraoperative findings:  1. Cystoscopy-bladder mucosa with hypervascularity and patchy erythema endoscopically consistent with cystitis.  Ureteral orifices orthotopic bilaterally.  No efflux seen from left 2. Fluoroscopy-retained contrast from prior CT with moderate hydronephrosis and proximal hydroureter with columning to the left proximal ureteral calculus   EBL: Minimal  Specimens: Urine left renal pelvis for culture  Indication: Leah Schaefer is a 80 y.o. who presented to the ED with abdominal pain found to have an obstructing 10 mm left proximal ureteral calculus with sepsis. After reviewing the management options for treatment, she elected to proceed with the above surgical procedure(s). We have discussed the potential benefits and risks of the procedure, side effects of the proposed treatment, the likelihood of the patient achieving the goals of the procedure, and any potential problems that might occur during the procedure or recuperation. Informed consent has been obtained.  Description of procedure:  The patient was taken to the operating room and general anesthesia was induced.  The patient was placed in the dorsal lithotomy position, prepped and draped in the usual sterile fashion.  IV antibiotics were previously administered in the ED.  A preoperative time-out was performed.   A 21 French cystoscope sheath with obturator was lubricated and passed per urethra.  The obturator was removed and replaced with a 30 degree lens and panendoscopy was performed with findings as described above.  A 0.38 Sensor guidewire was then advanced up the left ureter past the proximal ureteral calculus  into the renal pelvis under fluoroscopic guidance.  A 5 French open-ended ureteral catheter was then advanced over the guidewire to the renal pelvis.  The guidewire was removed and 10 mL of blood-tinged cloudy urine was aspirated and sent for culture.  The Sensor wire was replaced and the ureteral catheter was removed.   A 6FR/24 cm Contour ureteral stent was then advanced over the guidewire without difficulty.  The guidewire was removed with a good curl noted in the renal pelvis under fluoroscopy and in the bladder under direct vision.  The cystoscope was removed and a 58 French Foley catheter was placed to gravity drainage.   The patient appeared to tolerate the procedure well and without complications.  After anesthetic reversal she was transported to the PACU in stable condition.  Recommendation: 1. Leave Foley catheter indwelling 24-48 hours   John Giovanni, MD

## 2020-11-06 NOTE — Plan of Care (Signed)

## 2020-11-06 NOTE — Progress Notes (Signed)
Urology Consult Follow Up  Subjective: Feeling much better this morning with resolution of pain and nausea/vomiting  Max temp: 100.1  Anti-infectives: Anti-infectives (From admission, onward)   Start     Dose/Rate Route Frequency Ordered Stop   11/06/20 2100  cefTRIAXone (ROCEPHIN) 1 g in sodium chloride 0.9 % 100 mL IVPB        1 g 200 mL/hr over 30 Minutes Intravenous Every 24 hours 11/05/20 2317     11/05/20 2045  cefTRIAXone (ROCEPHIN) 1 g in sodium chloride 0.9 % 100 mL IVPB        1 g 200 mL/hr over 30 Minutes Intravenous  Once 11/05/20 2032 11/05/20 2120      Current Facility-Administered Medications  Medication Dose Route Frequency Provider Last Rate Last Admin  . 0.9 %  sodium chloride infusion   Intravenous Continuous Caren Griffins, MD 150 mL/hr at 11/06/20 0842 New Bag at 11/06/20 0842  . acetaminophen (TYLENOL) tablet 650 mg  650 mg Oral Q6H PRN Arieana Somoza C, MD       Or  . acetaminophen (TYLENOL) suppository 650 mg  650 mg Rectal Q6H PRN Khadar Monger C, MD      . cefTRIAXone (ROCEPHIN) 1 g in sodium chloride 0.9 % 100 mL IVPB  1 g Intravenous Q24H Marquell Saenz C, MD      . enoxaparin (LOVENOX) injection 45 mg  0.5 mg/kg Subcutaneous Q24H Yaquelin Langelier C, MD   45 mg at 11/06/20 0919  . HYDROcodone-acetaminophen (NORCO/VICODIN) 5-325 MG per tablet 1-2 tablet  1-2 tablet Oral Q4H PRN Linnae Rasool C, MD      . insulin aspart (novoLOG) injection 0-15 Units  0-15 Units Subcutaneous TID WC Alfretta Pinch, Ronda Fairly, MD   5 Units at 11/06/20 0916  . insulin aspart (novoLOG) injection 0-5 Units  0-5 Units Subcutaneous QHS Kevona Lupinacci C, MD      . morphine 2 MG/ML injection 2 mg  2 mg Intravenous Q2H PRN Montrel Donahoe C, MD      . ondansetron (ZOFRAN) tablet 4 mg  4 mg Oral Q6H PRN Katherin Ramey C, MD       Or  . ondansetron (ZOFRAN) injection 4 mg  4 mg Intravenous Q6H PRN Stran Raper C, MD         Objective: Vital signs in last 24 hours: Temp:  [97.9 F (36.6  C)-100.1 F (37.8 C)] 97.9 F (36.6 C) (04/02 1123) Pulse Rate:  [84-115] 85 (04/02 1123) Resp:  [15-29] 16 (04/02 1123) BP: (86-141)/(52-118) 105/68 (04/02 1123) SpO2:  [94 %-100 %] 98 % (04/02 1123) Weight:  [86 kg-90.7 kg] 86 kg (04/02 1042)  Intake/Output from previous day: 04/01 0701 - 04/02 0700 In: 540 [P.O.:240; I.V.:300] Out: 550 [Urine:550] Intake/Output this shift: Total I/O In: -  Out: 009 [Urine:875]   Physical Exam  Alert, no acute distress GU: Foley catheter draining clear urine  Lab Results:  Recent Labs    11/05/20 1749 11/06/20 0125  WBC 11.9* 16.4*  HGB 13.2 11.9*  HCT 38.9 35.6*  PLT 237 213   BMET Recent Labs    11/05/20 1749 11/06/20 0125  NA 134* 135  K 3.5 3.8  CL 95* 99  CO2 26 25  GLUCOSE 170* 196*  BUN 15 16  CREATININE 1.07* 1.22*  CALCIUM 9.5 8.9   PT/INR Recent Labs    11/05/20 1832 11/06/20 0125  LABPROT 13.2 13.9  INR 1.0 1.1     Assessment:  Clinically and symptomatically  improved status post left ureteral stent placement for obstructing proximal ureteral calculus with sepsis   Recommendation:  Continue IV antibiotic pending cultures  DC Foley-order written    LOS: 1 day    Leah Schaefer 11/06/2020

## 2020-11-06 NOTE — Transfer of Care (Signed)
Immediate Anesthesia Transfer of Care Note  Patient: Leah Schaefer  Procedure(s) Performed: CYSTOSCOPY WITH STENT PLACEMENT (Left Ureter)  Patient Location: PACU  Anesthesia Type:General  Level of Consciousness: awake  Airway & Oxygen Therapy: Patient Spontanous Breathing and Patient connected to face mask oxygen  Post-op Assessment: Report given to RN and Post -op Vital signs reviewed and stable  Post vital signs: Reviewed  Last Vitals:  Vitals Value Taken Time  BP    Temp    Pulse    Resp    SpO2      Last Pain:  Vitals:   11/05/20 1745  TempSrc:   PainSc: 4          Complications: No complications documented.

## 2020-11-07 ENCOUNTER — Inpatient Hospital Stay: Payer: Medicare HMO

## 2020-11-07 DIAGNOSIS — N390 Urinary tract infection, site not specified: Secondary | ICD-10-CM | POA: Diagnosis not present

## 2020-11-07 DIAGNOSIS — N179 Acute kidney failure, unspecified: Secondary | ICD-10-CM | POA: Diagnosis not present

## 2020-11-07 DIAGNOSIS — I69354 Hemiplegia and hemiparesis following cerebral infarction affecting left non-dominant side: Secondary | ICD-10-CM | POA: Diagnosis not present

## 2020-11-07 LAB — CBC
HCT: 35.5 % — ABNORMAL LOW (ref 36.0–46.0)
Hemoglobin: 11.7 g/dL — ABNORMAL LOW (ref 12.0–15.0)
MCH: 30.2 pg (ref 26.0–34.0)
MCHC: 33 g/dL (ref 30.0–36.0)
MCV: 91.5 fL (ref 80.0–100.0)
Platelets: 218 10*3/uL (ref 150–400)
RBC: 3.88 MIL/uL (ref 3.87–5.11)
RDW: 12.5 % (ref 11.5–15.5)
WBC: 14.2 10*3/uL — ABNORMAL HIGH (ref 4.0–10.5)
nRBC: 0 % (ref 0.0–0.2)

## 2020-11-07 LAB — GLUCOSE, CAPILLARY
Glucose-Capillary: 192 mg/dL — ABNORMAL HIGH (ref 70–99)
Glucose-Capillary: 177 mg/dL — ABNORMAL HIGH (ref 70–99)
Glucose-Capillary: 114 mg/dL — ABNORMAL HIGH (ref 70–99)
Glucose-Capillary: 162 mg/dL — ABNORMAL HIGH (ref 70–99)

## 2020-11-07 LAB — BASIC METABOLIC PANEL
Anion gap: 10 (ref 5–15)
BUN: 16 mg/dL (ref 8–23)
CO2: 26 mmol/L (ref 22–32)
Calcium: 8.7 mg/dL — ABNORMAL LOW (ref 8.9–10.3)
Chloride: 100 mmol/L (ref 98–111)
Creatinine, Ser: 1.01 mg/dL — ABNORMAL HIGH (ref 0.44–1.00)
GFR, Estimated: 57 mL/min — ABNORMAL LOW (ref >60)
Glucose, Bld: 175 mg/dL — ABNORMAL HIGH (ref 70–99)
Potassium: 3.1 mmol/L — ABNORMAL LOW (ref 3.5–5.1)
Sodium: 136 mmol/L (ref 135–145)

## 2020-11-07 MED ORDER — POTASSIUM CHLORIDE CRYS ER 20 MEQ PO TBCR
30.0000 meq | EXTENDED_RELEASE_TABLET | ORAL | Status: AC
  Administered 2020-11-07: 30 meq via ORAL
  Filled 2020-11-07: qty 1

## 2020-11-07 MED ORDER — ZOLPIDEM TARTRATE 5 MG PO TABS
5.0000 mg | ORAL_TABLET | Freq: Every evening | ORAL | Status: DC | PRN
  Administered 2020-11-07: 5 mg via ORAL
  Filled 2020-11-07: qty 1

## 2020-11-07 MED ORDER — ALUM & MAG HYDROXIDE-SIMETH 200-200-20 MG/5ML PO SUSP
30.0000 mL | Freq: Four times a day (QID) | ORAL | Status: DC | PRN
  Administered 2020-11-07: 30 mL via ORAL
  Filled 2020-11-07: qty 30

## 2020-11-07 MED ORDER — POTASSIUM CHLORIDE CRYS ER 20 MEQ PO TBCR
30.0000 meq | EXTENDED_RELEASE_TABLET | Freq: Once | ORAL | Status: AC
  Administered 2020-11-07: 30 meq via ORAL
  Filled 2020-11-07: qty 1

## 2020-11-07 NOTE — Progress Notes (Signed)
PROGRESS NOTE  Leah DEVOS HEN:[id number] DOB: April 15, 1941 DOA: 11/05/2020 PCP: Jerrol Banana., MD   LOS: 2 days   Brief Narrative / Interim history: 80 year old female with history of type 2 diabetes mellitus, essential hypertension, prior history of breast counseled on letrozole, left-sided hemiparesis secondary to her stroke couple of months ago but fairly active and ambulatory leaving somewhat independently with family close by presents to the hospital with complaints of left lower quadrant and left flank pain.  This is progressive, severe, associated with nausea and vomiting and also she was febrile up to 101 at home.  She was brought to the hospital and initial evaluation revealed obstructive 10 mm left UPJ stone with moderate left-sided hydronephrosis.  Urology was consulted and she was taken to the OR emergently overnight and is now status post left ureteral stent placement.  Subjective / 24h Interval events: She is feeling weak this morning but overall improved.  Temp 100.1 last night, patient reported 104 bilateral see that documented.  No nausea or vomiting.  Assessment & Plan: Principal Problem Severe sepsis, complicated UTI, hydronephrosis with renal ureter calculus obstruction-patient was admitted to the hospital with acute obstructive stone on the left side.  Urology consulted.  To the OR emergently on 4/2 and she is now status post left ureteral stent placement.  She has been placed on ceftriaxone.   -Sepsis physiology overall improving however still intermittent fevers.  Continue ceftriaxone, monitor cultures -Her intermittent hypotension seems to have resolved this morning, she is eating and drinking well, decrease the rate of fluids -Foley removed 4/2  Active Problems Acute kidney injury -creatinine raised up to 1.22 from a baseline of 0.69.  This is likely in the setting of #1.  Improving, keep on fluids for another day   Postural dizziness with  presyncope -Patient had an episode of near syncope while awaiting evaluation in the waiting room with BP 86/55, improving with IV fluid bolus, suspect related to hypotension in the setting of sepsis.  Resolved  Diabetes mellitus, type 2 (HCC) -Sliding scale insulin coverage.  CBGs with fairly good control  CBG (last 3)  Recent Labs    11/06/20 1636 11/06/20 2030 11/07/20 0817  GLUCAP 132* 162* 192*   History of breast cancer -Continue letrozole pending medication reconciliation  Hemiparesis affecting left side as late effect of stroke ([place]) -Had a stroke in February 2022, she is ambulating with a walker, able to live independently but daughter is next-door and helping throughout the day -Continue home medications  Scheduled Meds: . atorvastatin  40 mg Oral QHS  . enoxaparin (LOVENOX) injection  0.5 mg/kg Subcutaneous Q24H  . insulin aspart  0-15 Units Subcutaneous TID WC  . insulin aspart  0-5 Units Subcutaneous QHS  . metoprolol tartrate  12.5 mg Oral BID  . potassium chloride  30 mEq Oral Q2H  . sertraline  50 mg Oral Daily   Continuous Infusions: . sodium chloride 75 mL/hr at 11/06/20 2252  . cefTRIAXone (ROCEPHIN)  IV 1 g (11/06/20 2152)   PRN Meds:.acetaminophen **OR** acetaminophen, HYDROcodone-acetaminophen, morphine injection, ondansetron **OR** ondansetron (ZOFRAN) IV, zolpidem  Diet Orders (From admission, onward)    Start     Ordered   11/05/20 2316  Diet heart healthy/carb modified Room service appropriate? Yes; Fluid consistency: Thin  Diet effective now       Question Answer Comment  Diet-HS Snack? Nothing   Room service appropriate? Yes   Fluid consistency: Thin      11/05/20 2317  DVT prophylaxis:      Code Status: DNR  Family Communication: No family present at bedside  Status is: Inpatient  Remains inpatient appropriate because:Inpatient level of care appropriate due to severity of illness   Dispo: The patient is from: Home               Anticipated d/c is to: Home              Patient currently is not medically stable to d/c.   Difficult to place patient No  Level of care: Progressive Cardiac  Consultants:  Urology   Procedures:  left ureteral stent placement 4/2  Microbiology  Pending   Antimicrobials: Ceftriaxone 4/1 >>    Objective: Vitals:   11/06/20 1609 11/06/20 1925 11/07/20 0303 11/07/20 0816  BP: 104/60 125/67 (!) 109/55 122/73  Pulse: 96 (!) 102 100 97  Resp: 16 16 14 16   Temp: 99.6 F (37.6 C) 100.1 F (37.8 C) 100.1 F (37.8 C) 97.9 F (36.6 C)  TempSrc: Oral Oral Oral   SpO2: 98% 96% 91% 94%  Weight:      Height:        Intake/Output Summary (Last 24 hours) at 11/07/2020 1013 Last data filed at 11/07/2020 0900 Gross per 24 hour  Intake 1200 ml  Output 1500 ml  Net -300 ml   Filed Weights   11/05/20 1746 11/06/20 0106 11/06/20 1042  Weight: 90.7 kg 86.4 kg 86 kg    Examination:  Constitutional: She is in no distress Eyes: No scleral icterus ENMT: Moist mucous membranes Neck: normal, supple Respiratory: Lungs are clear bilaterally, no wheezing or crackles heard Cardiovascular: Regular rate and rhythm, no murmurs, no peripheral edema Abdomen: Soft, NT, ND, bowel sounds positive Musculoskeletal: no clubbing / cyanosis.  Skin: No rashes appreciated Neurologic: No new focal deficits  Data Reviewed: I have independently reviewed following labs and imaging studies   CBC: Recent Labs  Lab 11/05/20 1749 11/06/20 0125 11/07/20 0413  WBC 11.9* 16.4* 14.2*  HGB 13.2 11.9* 11.7*  HCT 38.9 35.6* 35.5*  MCV 89.4 90.1 91.5  PLT 237 213 160   Basic Metabolic Panel: Recent Labs  Lab 11/05/20 1749 11/06/20 0125 11/07/20 0413  NA 134* 135 136  K 3.5 3.8 3.1*  CL 95* 99 100  CO2 26 25 26   GLUCOSE 170* 196* 175*  BUN 15 16 16   CREATININE 1.07* 1.22* 1.01*  CALCIUM 9.5 8.9 8.7*   Liver Function Tests: Recent Labs  Lab 11/05/20 1749  AST 18  ALT 15  ALKPHOS 52   BILITOT 0.9  PROT 7.9  ALBUMIN 4.4   Coagulation Profile: Recent Labs  Lab 11/05/20 1832 11/06/20 0125  INR 1.0 1.1   HbA1C: No results for input(s): HGBA1C in the last 72 hours. CBG: Recent Labs  Lab 11/06/20 0738 11/06/20 1217 11/06/20 1636 11/06/20 2030 11/07/20 0817  GLUCAP 228* 203* 132* 162* 192*    Recent Results (from the past 240 hour(s))  Urine culture     Status: Abnormal (Preliminary result)   Collection Time: 11/05/20  6:32 PM   Specimen: Urine, Random  Result Value Ref Range Status   Specimen Description   Final    URINE, RANDOM Performed at [facility], [address] [place], [place] [address]    Special Requests   Final    NONE Performed at [facility], [address], [place], [place] [address]    Culture (A)  Final    30,000 COLONIES/mL  GRAM NEGATIVE RODS SUSCEPTIBILITIES TO FOLLOW Performed at [facility], [place] [address], [place], [place] [address]    Report Status PENDING  Incomplete  Blood Culture (routine x 2)     Status: None (Preliminary result)   Collection Time: 11/05/20  6:32 PM   Specimen: BLOOD  Result Value Ref Range Status   Specimen Description BLOOD RIGHT ANTECUBITAL  Final   Special Requests   Final    BOTTLES DRAWN AEROBIC AND ANAEROBIC Blood Culture adequate volume   Culture   Final    NO GROWTH 2 DAYS Performed at [facility], [address], [place], [place] [address]    Report Status PENDING  Incomplete  Blood Culture (routine x 2)     Status: None (Preliminary result)   Collection Time: 11/05/20  6:32 PM   Specimen: BLOOD  Result Value Ref Range Status   Specimen Description BLOOD BLOOD RIGHT WRIST  Final   Special Requests   Final    BOTTLES DRAWN AEROBIC AND ANAEROBIC Blood Culture results may not be optimal due to an inadequate volume of blood received in culture bottles   Culture   Final    NO GROWTH 2 DAYS Performed at [facility], [address],  [place], [place] [address]    Report Status PENDING  Incomplete  Resp Panel by RT-PCR (Flu A&B, Covid) Nasopharyngeal Swab     Status: None   Collection Time: 11/05/20  6:32 PM   Specimen: Nasopharyngeal Swab; Nasopharyngeal(NP) swabs in vial transport medium  Result Value Ref Range Status   SARS Coronavirus 2 by RT PCR NEGATIVE NEGATIVE Final    Comment: (NOTE) SARS-CoV-2 target nucleic acids are NOT DETECTED.  The SARS-CoV-2 RNA is generally detectable in upper respiratory specimens during the acute phase of infection. The lowest concentration of SARS-CoV-2 viral copies this assay can detect is 138 copies/mL. A negative result does not preclude SARS-Cov-2 infection and should not be used as the sole basis for treatment or other patient management decisions. A negative result may occur with  improper specimen collection/handling, submission of specimen other than nasopharyngeal swab, presence of viral mutation(s) within the areas targeted by this assay, and inadequate number of viral copies(<138 copies/mL). A negative result must be combined with clinical observations, patient history, and epidemiological information. The expected result is Negative.  Fact Sheet for Patients:  EntrepreneurPulse.com.au  Fact Sheet for Healthcare Providers:  IncredibleEmployment.be  This test is no t yet approved or cleared by the [place] FDA and  has been authorized for detection and/or diagnosis of SARS-CoV-2 by FDA under an Emergency Use Authorization (EUA). This EUA will remain  in effect (meaning this test can be used) for the duration of the COVID-19 declaration under Section 564(b)(1) of the Act, 21 U.S.C.section 360bbb-3(b)(1), unless the authorization is terminated  or revoked sooner.       Influenza A by PCR NEGATIVE NEGATIVE Final   Influenza B by PCR NEGATIVE NEGATIVE Final    Comment: (NOTE) The Xpert Xpress SARS-CoV-2/FLU/RSV plus assay is  intended as an aid in the diagnosis of influenza from Nasopharyngeal swab specimens and should not be used as a sole basis for treatment. Nasal washings and aspirates are unacceptable for Xpert Xpress SARS-CoV-2/FLU/RSV testing.  Fact Sheet for Patients: EntrepreneurPulse.com.au  Fact Sheet for Healthcare Providers: IncredibleEmployment.be  This test is not yet approved or cleared by the [place] FDA and has been authorized for detection and/or diagnosis of SARS-CoV-2 by FDA under an  Emergency Use Authorization (EUA). This EUA will remain in effect (meaning this test can be used) for the duration of the COVID-19 declaration under Section 564(b)(1) of the Act, 21 U.S.C. section 360bbb-3(b)(1), unless the authorization is terminated or revoked.  Performed at [facility], [address] [place], [place] [address]   Urine Culture     Status: Abnormal (Preliminary result)   Collection Time: 11/05/20 11:46 PM   Specimen: PATH Other; Urine  Result Value Ref Range Status   Specimen Description   Final    URINE, RANDOM LEFT RENAL PELVIS CULTURE Performed at [facility], [address] [place], [place] [address]    Special Requests   Final    NONE Performed at [facility], [address] [place], [place] [address]    Culture (A)  Final    30,000 COLONIES/mL GRAM NEGATIVE RODS SUSCEPTIBILITIES TO FOLLOW Performed at [facility], [place] [address], [place], [place] [address]    Report Status PENDING  Incomplete  Blood culture (single)     Status: None (Preliminary result)   Collection Time: 11/06/20  1:25 AM   Specimen: BLOOD  Result Value Ref Range Status   Specimen Description BLOOD LEFT FOREARM  Final   Special Requests   Final    BOTTLES DRAWN AEROBIC AND ANAEROBIC Blood Culture adequate volume   Culture   Final    NO GROWTH 1 DAY Performed at [facility], [address],  [place], [place] [address]    Report Status PENDING  Incomplete     Radiology Studies: No results found. Marzetta Board, MD, PhD [facility]  Between 7 am - 7 pm I am available, please contact me via Amion (for emergencies) or Securechat (non urgent messages)  Between 7 pm - 7 am I am not available, please contact night coverage MD/APP via Amion

## 2020-11-07 NOTE — Anesthesia Postprocedure Evaluation (Signed)
Anesthesia Post Note  Patient: Leah Schaefer  Procedure(s) Performed: CYSTOSCOPY WITH STENT PLACEMENT (Left Ureter)  Patient location during evaluation: PACU Anesthesia Type: General Level of consciousness: awake and alert and oriented Pain management: pain level controlled Respiratory status: spontaneous breathing Cardiovascular status: blood pressure returned to baseline Anesthetic complications: no   No complications documented.   Last Vitals:  Vitals:   11/07/20 0303 11/07/20 0816  BP: (!) 109/55 122/73  Pulse: 100 97  Resp: 14 16  Temp: 37.8 C 36.6 C  SpO2: 91% 94%    Last Pain:  Vitals:   11/07/20 0303  TempSrc: Oral  PainSc:                  [name]

## 2020-11-08 ENCOUNTER — Encounter: Payer: Self-pay | Admitting: Urology

## 2020-11-08 DIAGNOSIS — A419 Sepsis, unspecified organism: Secondary | ICD-10-CM | POA: Diagnosis not present

## 2020-11-08 DIAGNOSIS — N390 Urinary tract infection, site not specified: Secondary | ICD-10-CM | POA: Diagnosis not present

## 2020-11-08 DIAGNOSIS — N179 Acute kidney failure, unspecified: Secondary | ICD-10-CM | POA: Diagnosis not present

## 2020-11-08 DIAGNOSIS — N201 Calculus of ureter: Secondary | ICD-10-CM

## 2020-11-08 LAB — CBC
HCT: 34.6 % — ABNORMAL LOW (ref 36.0–46.0)
Hemoglobin: 11.6 g/dL — ABNORMAL LOW (ref 12.0–15.0)
MCH: 30.4 pg (ref 26.0–34.0)
MCHC: 33.5 g/dL (ref 30.0–36.0)
MCV: 90.6 fL (ref 80.0–100.0)
Platelets: 217 10*3/uL (ref 150–400)
RBC: 3.82 MIL/uL — ABNORMAL LOW (ref 3.87–5.11)
RDW: 12.3 % (ref 11.5–15.5)
WBC: 9.9 10*3/uL (ref 4.0–10.5)
nRBC: 0 % (ref 0.0–0.2)

## 2020-11-08 LAB — URINE CULTURE
Culture: 30000 — AB
Culture: 30000 — AB

## 2020-11-08 LAB — BASIC METABOLIC PANEL
Anion gap: 7 (ref 5–15)
BUN: 14 mg/dL (ref 8–23)
CO2: 26 mmol/L (ref 22–32)
Calcium: 8.9 mg/dL (ref 8.9–10.3)
Chloride: 104 mmol/L (ref 98–111)
Creatinine, Ser: 0.88 mg/dL (ref 0.44–1.00)
GFR, Estimated: >60 mL/min (ref >60)
Glucose, Bld: 162 mg/dL — ABNORMAL HIGH (ref 70–99)
Potassium: 3.7 mmol/L (ref 3.5–5.1)
Sodium: 137 mmol/L (ref 135–145)

## 2020-11-08 LAB — GLUCOSE, CAPILLARY
Glucose-Capillary: 222 mg/dL — ABNORMAL HIGH (ref 70–99)
Glucose-Capillary: 187 mg/dL — ABNORMAL HIGH (ref 70–99)

## 2020-11-08 MED ORDER — AMOXICILLIN-POT CLAVULANATE 875-125 MG PO TABS: 1.0000 | ORAL_TABLET | Freq: Two times a day (BID) | ORAL | 0 refills | Status: DC

## 2020-11-08 NOTE — Care Management Important Message (Signed)
Important Message  Patient Details  Name: SEATTLE DALPORTO MRN: [id number] Date of Birth: 1940-12-10   Medicare Important Message Given:  Yes     Dannette Barbara 11/08/2020, 12:19 PM

## 2020-11-08 NOTE — Discharge Summary (Addendum)
Physician Discharge Summary  Leah Schaefer BJS:[id number] DOB: 1940/12/14 DOA: 11/05/2020  PCP: Jerrol Banana., MD  Admit date: 11/05/2020 Discharge date: 11/08/2020  Admitted From: home Disposition:  home  Recommendations for Outpatient Follow-up:  1. Follow up with PCP in 1-2 weeks 2. Follow up with Dr Bernardo Heater in 2 weeks  Home Health: none Equipment/Devices: none  Discharge Condition: stable CODE STATUS: Full code Diet recommendation: heart healthy  HPI: Per admitting MD, Leah Schaefer is a 80 y.o. 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  It is of severe intensity, unrelieved with OTC pain meds.  It is now associated with nausea and vomiting and had a fever of 101.  Denies dysuria or change in bowel habits.  Has had no cough or chest pain.  While awaiting triage in the emergency room patient had a passing out episode, without a fall or hurting herself and was awake and alert by the time they brought her back to the examination room.   Initial evaluation revealed obstructive 10 mm left UPJ stone with moderate left-sided hydronephrosis.  Urology was consulted and she was taken to the OR emergently overnight and is now status post left ureteral stent placement.  Hospital Course / Discharge diagnoses: Principal Problem Severe sepsis, complicated UTI, hydronephrosis with renal ureter calculus obstruction-patient was admitted to the hospital with acute obstructive stone on the left side.  Urology consulted and was taken to OR emergently on 4/2 and she is now status post left ureteral stent placement.  She has been placed on ceftriaxone while hospitalized, cultures resulted in Proteis resistant to Nitrofurantoin but otherwise sensitive. She was narrowed to  Augmentin for 8 more days for a total of 10 day course. She is afebrile, WBC normalized, she is able to tolerate a regular diet, she is feeling back to baseline and will be discharged home in stable condition. Dr Bernardo Heater will follow up in 2 weeks for definitive stone removal   Active Problems Acute kidney injury -creatinine raised up to 1.22 from a baseline of 0.69.  This is likely in the setting of #1. Cr normalized on dc, she is tolerating an oral diet  Postural dizziness with presyncope -Patient had an episode of near syncope while awaiting evaluation in the waiting room with BP 86/55, improving with IV fluid bolus, suspect related to hypotension in the setting of sepsis.  Resolved Diabetes mellitus, type 2 ([place]) -resume home medications on dc History of breast cancer -Continue letrozole Hemiparesis affecting left side as late effect of stroke [facility]) -Had a stroke in February 2022, she is ambulating with a walker, able to live independently but daughter is next-door and helping throughout the day. Continue home medications with Plavix. Her Aspirin has been stopped as an outpatient PTA  Discharge Instructions  Allergies as of 11/08/2020   No Known Allergies     Medication List    STOP taking these medications   aspirin EC 81 MG tablet     TAKE these medications   acetaminophen 500 MG tablet Commonly known as: TYLENOL Take 500 mg by mouth every 6 (six) hours as needed for mild pain or headache.   amoxicillin-clavulanate 875-125 MG tablet Commonly known as: Augmentin Take 1 tablet by mouth every 12 (twelve) hours for 8 days.  atorvastatin 40 MG tablet Commonly known as: LIPITOR TAKE 1 TABLET EVERY EVENING   bimatoprost 0.01 % Soln Commonly known as: LUMIGAN Place 1 drop into both eyes at bedtime.   Calcium Carb-Cholecalciferol 600-800 MG-UNIT Tabs Take 1 tablet by mouth 2 (two) times daily.   chlorthalidone 25 MG tablet Commonly known as: HYGROTON Take 1 tablet (25 mg total)  by mouth daily.   cholecalciferol 1000 units tablet Commonly known as: VITAMIN D Take 1,000 Units by mouth daily.   clopidogrel 75 MG tablet Commonly known as: PLAVIX Take 75 mg by mouth daily.   gabapentin 300 MG capsule Commonly known as: NEURONTIN TAKE 2 CAPSULES AT BEDTIME   latanoprost 0.005 % ophthalmic solution Commonly known as: XALATAN Place 1 drop into both eyes at bedtime.   letrozole 2.5 MG tablet Commonly known as: FEMARA TAKE 1 TABLET (2.5 MG TOTAL) BY MOUTH DAILY.   losartan 50 MG tablet Commonly known as: COZAAR Take 1 tablet (50 mg total) by mouth daily.   meclizine 25 MG tablet Commonly known as: ANTIVERT Take 0.5 tablets (12.5 mg total) by mouth 3 (three) times daily as needed for dizziness.   metFORMIN 1000 MG tablet Commonly known as: GLUCOPHAGE Take 1 tablet (1,000 mg total) by mouth 2 (two) times daily with a meal.   metoprolol tartrate 25 MG tablet Commonly known as: LOPRESSOR TAKE 1/2 TABLET (12.5 MG TOTAL) TWICE DAILY   multivitamin with minerals Tabs tablet Take 1 tablet by mouth daily.   omega-3 acid ethyl esters 1 g capsule Commonly known as: LOVAZA Take 1 g by mouth daily.   pantoprazole 40 MG tablet Commonly known as: PROTONIX TAKE 1 TABLET TWICE DAILY   pyridoxine 100 MG tablet Commonly known as: B-6 Take 100 mg by mouth daily.   sertraline 100 MG tablet Commonly known as: ZOLOFT Take 0.5 tablets (50 mg total) by mouth daily. Doctor increased dose to 100mg  05/2020  but patient reports that she never increased the dose. She continued taking 50mg  daily. What changed: Another medication with the same name was removed. Continue taking this medication, and follow the directions you see here.   vitamin B-12 500 MCG tablet Commonly known as: CYANOCOBALAMIN Take 1,000 mcg by mouth daily.   vitamin C 500 MG tablet Commonly known as: ASCORBIC ACID Take 1,000 mg by mouth daily.   vitamin E 180 MG (400 UNITS) capsule Take 400  Units by mouth daily.       Consultations:  Urology   Procedures/Studies: left ureteral stent placement 4/2  DG Abd 1 View  Result Date: 11/07/2020 CLINICAL DATA:  Status post left ureteral stent placement. EXAM: ABDOMEN - 1 VIEW COMPARISON:  November 01, 2010 FINDINGS: The bowel gas pattern is normal. A moderate amount of stool is seen throughout the large bowel. A left-sided endo ureteral stent is in place. No radio-opaque calculi or other significant radiographic abnormality are seen. Radiopaque surgical clips are seen overlying the right upper quadrant. IMPRESSION: 1. Left-sided endo ureteral stent. 2. Moderate stool burden without bowel obstruction. Electronically Signed   By: Virgina Norfolk M.D.   On: 11/07/2020 18:28   CT Head Wo Contrast  Result Date: 11/05/2020 CLINICAL DATA:  Seizure emesis EXAM: CT HEAD WITHOUT CONTRAST TECHNIQUE: Contiguous axial images were obtained from the base of the skull through the vertex without intravenous contrast. COMPARISON:  MRI 09/06/2020, CT brain 09/05/2020 FINDINGS: Brain: No acute territorial infarction, hemorrhage or intracranial mass. Chronic infarct within the right frontal parietal subcortical white matter,  new since CT from 09/05/2020 but corresponding to MRI demonstrated infarct. Chronic small vessel ischemic changes of the white matter. Stable ventricle size Vascular: No hyperdense vessels.  Carotid vascular calcification Skull: Normal. Negative for fracture or focal lesion. Sinuses/Orbits: No acute finding. Other: None IMPRESSION: 1. No CT evidence for acute intracranial abnormality. 2. Interval chronic infarct in the right frontal parietal subcortical white matter. 3. Atrophy and chronic small vessel ischemic changes of the white matter. Electronically Signed   By: Donavan Foil M.D.   On: 11/05/2020 19:45   CT ABDOMEN PELVIS W CONTRAST  Result Date: 11/05/2020 CLINICAL DATA:  Diarrhea. Left lower quadrant pain. Emesis. Abdominal  abscess/infection suspected. EXAM: CT ABDOMEN AND PELVIS WITH CONTRAST TECHNIQUE: Multidetector CT imaging of the abdomen and pelvis was performed using the standard protocol following bolus administration of intravenous contrast. CONTRAST:  171mL OMNIPAQUE IOHEXOL 300 MG/ML  SOLN COMPARISON:  CT abdomen and pelvis 07/27/2017. FINDINGS: Lower chest: Scarring at the right lung base is stable. Lungs are otherwise clear. Heart size is normal. Vascular calcifications noted coronary arteries. Hepatobiliary: Diffuse fatty infiltration liver noted. No focal lesions present. Surgical clips are present the gallbladder fossa. The common bile duct is within. Pancreas: Unremarkable. No pancreatic ductal dilatation or surrounding inflammatory changes. Spleen: Normal in size without focal abnormality. Adrenals/Urinary Tract: Adrenal glands are normal bilaterally. Right kidney in ureter is within limits. Obstructing 10 mm stone is present at the left UPJ. Moderate left-sided hydronephrosis is present. Delayed nephrogram evident. A punctate nonobstructing stone is present at the lower pole of the left kidney. 2 small stones are present at the lower pole of the left kidney. The larger measures 4 mm. The distal left ureter is within normal limits. Urinary bladder is normal. Stomach/Bowel: The stomach and duodenum are within normal limits. Small bowel is unremarkable. Terminal ileum is normal. Appendix is surgically absent. The ascending and transverse colon mostly collapsed. Descending colon is unremarkable. Minimal diverticular changes are present in the sigmoid colon without inflammation. Vascular/Lymphatic: Atherosclerotic calcifications are present in the aorta and branch vessels. No aneurysm is present. No significant retroperitoneal adenopathy is present. Reproductive: Uterus and bilateral adnexa are unremarkable. Other: No abdominal wall hernia or abnormality. No abdominopelvic ascites. Musculoskeletal: Multilevel degenerative  changes are again noted within the lumbar spine. Focal rightward curvature is present at L3-4. Degenerative changes are present at the SI joints. No focal lytic or blastic lesions are present. No acute healing fractures are present. IMPRESSION: 1. Obstructing 10 mm stone at the left UPJ with moderate left-sided hydronephrosis and delayed nephrogram. 2. Two additional nonobstructing stones at the lower pole of the left kidney. 3. Hepatic steatosis. 4. Cholecystectomy and appendectomy. 5. Multilevel degenerative changes in the lumbar spine. 6. Aortic Atherosclerosis (ICD10-I70.0). Electronically Signed   By: San Morelle M.D.   On: 11/05/2020 19:45   DG Chest Port 1 View  Result Date: 11/05/2020 CLINICAL DATA:  Not responsive EXAM: PORTABLE CHEST 1 VIEW COMPARISON:  09/04/2011 FINDINGS: No focal consolidation or pleural effusion. Curvilinear opacity at the cardiac base could be secondary to faint mitral annular calcification or potentially hiatal hernia. Normal heart size. No pneumothorax. IMPRESSION: No active disease. Electronically Signed   By: Donavan Foil M.D.   On: 11/05/2020 19:14     Subjective: - no chest pain, shortness of breath, no abdominal pain, nausea or vomiting.   Discharge Exam: BP 140/80 (BP Location: Left Arm)   Pulse 85   Temp 99.8 F (37.7 C) (Oral)   Resp  18   Ht 5\' 6"  (1.676 m)   Wt 86 kg   SpO2 99%   BMI 30.62 kg/m   General: Pt is alert, awake, not in acute distress Cardiovascular: RRR, S1/S2 +, no rubs, no gallops Respiratory: CTA bilaterally, no wheezing, no rhonchi Abdominal: Soft, NT, ND, bowel sounds + Extremities: no edema, no cyanosis   The results of significant diagnostics from this hospitalization (including imaging, microbiology, ancillary and laboratory) are listed below for reference.     Microbiology: Recent Results (from the past 240 hour(s))  Urine culture     Status: Abnormal   Collection Time: 11/05/20  6:32 PM   Specimen: Urine,  Random  Result Value Ref Range Status   Specimen Description   Final    URINE, RANDOM Performed at [facility], [address] [place], [place] [address]    Special Requests   Final    NONE Performed at [facility], [place], [place] [address]    Culture 30,000 COLONIES/mL PROTEUS MIRABILIS (A)  Final   Report Status 11/08/2020 FINAL  Final   Organism ID, Bacteria PROTEUS MIRABILIS (A)  Final      Susceptibility   Proteus mirabilis - MIC*    AMPICILLIN <=2 SENSITIVE Sensitive     CEFAZOLIN <=4 SENSITIVE Sensitive     CEFEPIME <=0.12 SENSITIVE Sensitive     CEFTRIAXONE <=0.25 SENSITIVE Sensitive     CIPROFLOXACIN <=0.25 SENSITIVE Sensitive     GENTAMICIN <=1 SENSITIVE Sensitive     IMIPENEM 2 SENSITIVE Sensitive     NITROFURANTOIN 128 RESISTANT Resistant     TRIMETH/SULFA <=20 SENSITIVE Sensitive     AMPICILLIN/SULBACTAM <=2 SENSITIVE Sensitive     PIP/TAZO <=4 SENSITIVE Sensitive     * 30,000 COLONIES/mL PROTEUS MIRABILIS  Blood Culture (routine x 2)     Status: None (Preliminary result)   Collection Time: 11/05/20  6:32 PM   Specimen: BLOOD  Result Value Ref Range Status   Specimen Description BLOOD RIGHT ANTECUBITAL  Final   Special Requests   Final    BOTTLES DRAWN AEROBIC AND ANAEROBIC Blood Culture adequate volume   Culture   Final    NO GROWTH 3 DAYS Performed at [facility], [address], [place], [place] [address]    Report Status PENDING  Incomplete  Blood Culture (routine x 2)     Status: None (Preliminary result)   Collection Time: 11/05/20  6:32 PM   Specimen: BLOOD  Result Value Ref Range Status   Specimen Description BLOOD BLOOD RIGHT WRIST  Final   Special Requests   Final    BOTTLES DRAWN AEROBIC AND ANAEROBIC Blood Culture results may not be optimal due to an inadequate volume of blood received in culture bottles   Culture   Final    NO GROWTH 3 DAYS Performed at [facility], [address] [place], [place] [address]    Report Status PENDING  Incomplete  Resp Panel by RT-PCR (Flu A&B, Covid) Nasopharyngeal Swab     Status: None   Collection Time: 11/05/20  6:32 PM   Specimen: Nasopharyngeal Swab; Nasopharyngeal(NP) swabs in vial transport medium  Result Value Ref Range Status   SARS Coronavirus 2 by RT PCR NEGATIVE NEGATIVE Final    Comment: (NOTE) SARS-CoV-2 target nucleic acids are NOT DETECTED.  The SARS-CoV-2 RNA is generally detectable in upper respiratory specimens during the acute phase of infection. The lowest concentration of SARS-CoV-2 viral copies this assay can  detect is 138 copies/mL. A negative result does not preclude SARS-Cov-2 infection and should not be used as the sole basis for treatment or other patient management decisions. A negative result may occur with  improper specimen collection/handling, submission of specimen other than nasopharyngeal swab, presence of viral mutation(s) within the areas targeted by this assay, and inadequate number of viral copies(<138 copies/mL). A negative result must be combined with clinical observations, patient history, and epidemiological information. The expected result is Negative.  Fact Sheet for Patients:  EntrepreneurPulse.com.au  Fact Sheet for Healthcare Providers:  IncredibleEmployment.be  This test is no t yet approved or cleared by the [place] FDA and  has been authorized for detection and/or diagnosis of SARS-CoV-2 by FDA under an Emergency Use Authorization (EUA). This EUA will remain  in effect (meaning this test can be used) for the duration of the COVID-19 declaration under Section 564(b)(1) of the Act, 21 U.S.C.section 360bbb-3(b)(1), unless the authorization is terminated  or revoked sooner.       Influenza A by PCR NEGATIVE NEGATIVE Final   Influenza B by PCR NEGATIVE NEGATIVE Final    Comment: (NOTE) The Xpert Xpress SARS-CoV-2/FLU/RSV plus  assay is intended as an aid in the diagnosis of influenza from Nasopharyngeal swab specimens and should not be used as a sole basis for treatment. Nasal washings and aspirates are unacceptable for Xpert Xpress SARS-CoV-2/FLU/RSV testing.  Fact Sheet for Patients: EntrepreneurPulse.com.au  Fact Sheet for Healthcare Providers: IncredibleEmployment.be  This test is not yet approved or cleared by the [place] FDA and has been authorized for detection and/or diagnosis of SARS-CoV-2 by FDA under an Emergency Use Authorization (EUA). This EUA will remain in effect (meaning this test can be used) for the duration of the COVID-19 declaration under Section 564(b)(1) of the Act, 21 U.S.C. section 360bbb-3(b)(1), unless the authorization is terminated or revoked.  Performed at [facility], [address] [place], [place] [address]   Urine Culture     Status: Abnormal   Collection Time: 11/05/20 11:46 PM   Specimen: PATH Other; Urine  Result Value Ref Range Status   Specimen Description   Final    URINE, RANDOM LEFT RENAL PELVIS CULTURE Performed at [facility], [address] [place], [place] [address]    Special Requests   Final    NONE Performed at [facility], [place], [place] [address]    Culture 30,000 COLONIES/mL PROTEUS MIRABILIS (A)  Final   Report Status 11/08/2020 FINAL  Final   Organism ID, Bacteria PROTEUS MIRABILIS (A)  Final      Susceptibility   Proteus mirabilis - MIC*    AMPICILLIN <=2 SENSITIVE Sensitive     CEFAZOLIN <=4 SENSITIVE Sensitive     CEFEPIME <=0.12 SENSITIVE Sensitive     CEFTRIAXONE <=0.25 SENSITIVE Sensitive     CIPROFLOXACIN <=0.25 SENSITIVE Sensitive     GENTAMICIN <=1 SENSITIVE Sensitive     IMIPENEM 2 SENSITIVE Sensitive     NITROFURANTOIN 128 RESISTANT Resistant     TRIMETH/SULFA <=20 SENSITIVE Sensitive     AMPICILLIN/SULBACTAM <=2 SENSITIVE Sensitive      PIP/TAZO <=4 SENSITIVE Sensitive     * 30,000 COLONIES/mL PROTEUS MIRABILIS  Blood culture (single)     Status: None (Preliminary result)   Collection Time: 11/06/20  1:25 AM   Specimen: BLOOD  Result Value Ref Range Status   Specimen Description BLOOD LEFT FOREARM  Final   Special Requests   Final  BOTTLES DRAWN AEROBIC AND ANAEROBIC Blood Culture adequate volume   Culture   Final    NO GROWTH 2 DAYS Performed at [facility], [place], [place] [address]    Report Status PENDING  Incomplete     Labs: Basic Metabolic Panel: Recent Labs  Lab 11/05/20 1749 11/06/20 0125 11/07/20 0413 11/08/20 0400  NA 134* 135 136 137  K 3.5 3.8 3.1* 3.7  CL 95* 99 100 104  CO2 26 25 26 26   GLUCOSE 170* 196* 175* 162*  BUN 15 16 16 14   CREATININE 1.07* 1.22* 1.01* 0.88  CALCIUM 9.5 8.9 8.7* 8.9   Liver Function Tests: Recent Labs  Lab 11/05/20 1749  AST 18  ALT 15  ALKPHOS 52  BILITOT 0.9  PROT 7.9  ALBUMIN 4.4   CBC: Recent Labs  Lab 11/05/20 1749 11/06/20 0125 11/07/20 0413 11/08/20 0400  WBC 11.9* 16.4* 14.2* 9.9  HGB 13.2 11.9* 11.7* 11.6*  HCT 38.9 35.6* 35.5* 34.6*  MCV 89.4 90.1 91.5 90.6  PLT 237 213 218 217   CBG: Recent Labs  Lab 11/07/20 0817 11/07/20 1125 11/07/20 1650 11/07/20 2202 11/08/20 0842  GLUCAP 192* 177* 114* 162* 222*   Hgb A1c No results for input(s): HGBA1C in the last 72 hours. Lipid Profile No results for input(s): CHOL, HDL, LDLCALC, TRIG, CHOLHDL, LDLDIRECT in the last 72 hours. Thyroid function studies No results for input(s): TSH, T4TOTAL, T3FREE, THYROIDAB in the last 72 hours.  Invalid input(s): FREET3 Urinalysis    Component Value Date/Time   COLORURINE YELLOW (A) 11/05/2020 1832   APPEARANCEUR HAZY (A) 11/05/2020 1832   APPEARANCEUR Clear 03/08/2012 2240   LABSPEC 1.039 (H) 11/05/2020 1832   LABSPEC 1.012 03/08/2012 2240   PHURINE 8.0 11/05/2020 1832   GLUCOSEU NEGATIVE 11/05/2020 1832    GLUCOSEU Negative 03/08/2012 2240   HGBUR LARGE (A) 11/05/2020 1832   BILIRUBINUR NEGATIVE 11/05/2020 1832   BILIRUBINUR negative 06/09/2020 1352   BILIRUBINUR Negative 03/08/2012 2240   KETONESUR 5 (A) 11/05/2020 1832   PROTEINUR NEGATIVE 11/05/2020 1832   UROBILINOGEN 0.2 06/09/2020 1352   NITRITE POSITIVE (A) 11/05/2020 1832   LEUKOCYTESUR LARGE (A) 11/05/2020 1832   LEUKOCYTESUR 1+ 03/08/2012 2240    FURTHER DISCHARGE INSTRUCTIONS:   Get Medicines reviewed and adjusted: Please take all your medications with you for your next visit with your Primary MD   Laboratory/radiological data: Please request your Primary MD to go over all hospital tests and procedure/radiological results at the follow up, please ask your Primary MD to get all Hospital records sent to his/her office.   In some cases, they will be blood work, cultures and biopsy results pending at the time of your discharge. Please request that your primary care M.D. goes through all the records of your hospital data and follows up on these results.   Also Note the following: If you experience worsening of your admission symptoms, develop shortness of breath, life threatening emergency, suicidal or homicidal thoughts you must seek medical attention immediately by calling 911 or calling your MD immediately  if symptoms less severe.   You must read complete instructions/literature along with all the possible adverse reactions/side effects for all the Medicines you take and that have been prescribed to you. Take any new Medicines after you have completely understood and accpet all the possible adverse reactions/side effects.    Do not drive when taking Pain medications or sleeping medications (Benzodaizepines)   Do not take more than prescribed  Pain, Sleep and Anxiety Medications. It is not advisable to combine anxiety,sleep and pain medications without talking with your primary care practitioner   Special Instructions: If you  have smoked or chewed Tobacco  in the last 2 yrs please stop smoking, stop any regular Alcohol  and or any Recreational drug use.   Wear Seat belts while driving.   Please note: You were cared for by a hospitalist during your hospital stay. Once you are discharged, your primary care physician will handle any further medical issues. Please note that NO REFILLS for any discharge medications will be authorized once you are discharged, as it is imperative that you return to your primary care physician (or establish a relationship with a primary care physician if you do not have one) for your post hospital discharge needs so that they can reassess your need for medications and monitor your lab values.  Time coordinating discharge: 40 minutes  SIGNED:  Marzetta Board, MD, PhD 11/08/2020, 10:51 AM

## 2020-11-08 NOTE — Discharge Instructions (Signed)
Follow with Jerrol Banana., MD in 5-7 days  Please get a complete blood count and chemistry panel checked by your Primary MD at your next visit, and again as instructed by your Primary MD. Please get your medications reviewed and adjusted by your Primary MD.  Please request your Primary MD to go over all Hospital Tests and Procedure/Radiological results at the follow up, please get all Hospital records sent to your Prim MD by signing hospital release before you go home.  In some cases, there will be blood work, cultures and biopsy results pending at the time of your discharge. Please request that your primary care M.D. goes through all the records of your hospital data and follows up on these results.  If you had Pneumonia of Lung problems at the Hospital: Please get a 2 view Chest X ray done in 6-8 weeks after hospital discharge or sooner if instructed by your Primary MD.  If you have Congestive Heart Failure: Please call your Cardiologist or Primary MD anytime you have any of the following symptoms:  1) 3 pound weight gain in 24 hours or 5 pounds in 1 week  2) shortness of breath, with or without a dry hacking cough  3) swelling in the hands, feet or stomach  4) if you have to sleep on extra pillows at night in order to breathe  Follow cardiac low salt diet and 1.5 lit/day fluid restriction.  If you have diabetes Accuchecks 4 times/day, Once in AM empty stomach and then before each meal. Log in all results and show them to your primary doctor at your next visit. If any glucose reading is under 80 or above 300 call your primary MD immediately.  If you have Seizure/Convulsions/Epilepsy: Please do not drive, operate heavy machinery, participate in activities at heights or participate in high speed sports until you have seen by Primary MD or a Neurologist and advised to do so again. Per [facility] statutes, patients with seizures are not allowed to drive until they have been  seizure-free for six months.  Use caution when using heavy equipment or power tools. Avoid working on ladders or at heights. Take showers instead of baths. Ensure the water temperature is not too high on the home water heater. Do not go swimming alone. Do not lock yourself in a room alone (i.e. bathroom). When caring for infants or small children, sit down when holding, feeding, or changing them to minimize risk of injury to the child in the event you have a seizure. Maintain good sleep hygiene. Avoid alcohol.   If you had Gastrointestinal Bleeding: Please ask your Primary MD to check a complete blood count within one week of discharge or at your next visit. Your endoscopic/colonoscopic biopsies that are pending at the time of discharge, will also need to followed by your Primary MD.  Get Medicines reviewed and adjusted. Please take all your medications with you for your next visit with your Primary MD  Please request your Primary MD to go over all hospital tests and procedure/radiological results at the follow up, please ask your Primary MD to get all Hospital records sent to his/her office.  If you experience worsening of your admission symptoms, develop shortness of breath, life threatening emergency, suicidal or homicidal thoughts you must seek medical attention immediately by calling 911 or calling your MD immediately  if symptoms less severe.  You must read complete instructions/literature along with all the possible adverse reactions/side effects for all the Medicines  you take and that have been prescribed to you. Take any new Medicines after you have completely understood and accpet all the possible adverse reactions/side effects.   Do not drive or operate heavy machinery when taking Pain medications.   Do not take more than prescribed Pain, Sleep and Anxiety Medications  Special Instructions: If you have smoked or chewed Tobacco  in the last 2 yrs please stop smoking, stop any regular  Alcohol  and or any Recreational drug use.  Wear Seat belts while driving.  Please note You were cared for by a hospitalist during your hospital stay. If you have any questions about your discharge medications or the care you received while you were in the hospital after you are discharged, you can call the unit and asked to speak with the hospitalist on call if the hospitalist that took care of you is not available. Once you are discharged, your primary care physician will handle any further medical issues. Please note that NO REFILLS for any discharge medications will be authorized once you are discharged, as it is imperative that you return to your primary care physician (or establish a relationship with a primary care physician if you do not have one) for your aftercare needs so that they can reassess your need for medications and monitor your lab values.  You can reach the hospitalist office at phone [phone] or fax [phone]   If you do not have a primary care physician, you can call [phone] for a physician referral.  Activity: As tolerated with Full fall precautions use walker/cane & assistance as needed    Diet: regular  Disposition Home

## 2020-11-08 NOTE — Progress Notes (Signed)
Patient discharged to discharge lounge. IV removed, telemetry d/c. Discharge instructions reviewed with patient. All discharge documentation complete.

## 2020-11-08 NOTE — Progress Notes (Signed)
Urology Inpatient Progress Note  Subjective: No acute events overnight. Creatinine down today, 0.88.  WBC count down today, 9.9.  Urine culture growing Proteus.  Blood cultures with no growth at 3 days.  On antibiotics as below. Patient denies flank pain, dysuria, urgency, frequency, or gross hematuria at this time.  She is voiding spontaneously and has no concerns today.  Anti-infectives: Anti-infectives (From admission, onward)   Start     Dose/Rate Route Frequency Ordered Stop   11/08/20 0000  amoxicillin-clavulanate (AUGMENTIN) 875-125 MG tablet        1 tablet Oral Every 12 hours 11/08/20 1047 11/16/20 2359   11/06/20 2100  cefTRIAXone (ROCEPHIN) 1 g in sodium chloride 0.9 % 100 mL IVPB        1 g 200 mL/hr over 30 Minutes Intravenous Every 24 hours 11/05/20 2317     11/05/20 2045  cefTRIAXone (ROCEPHIN) 1 g in sodium chloride 0.9 % 100 mL IVPB        1 g 200 mL/hr over 30 Minutes Intravenous  Once 11/05/20 2032 11/05/20 2120      Current Facility-Administered Medications  Medication Dose Route Frequency Provider Last Rate Last Admin  . 0.9 %  sodium chloride infusion   Intravenous Continuous Caren Griffins, MD 75 mL/hr at 11/08/20 1038 Restarted at 11/08/20 1038  . acetaminophen (TYLENOL) tablet 650 mg  650 mg Oral Q6H PRN Stoioff, Scott C, MD   650 mg at 11/07/20 2048   Or  . acetaminophen (TYLENOL) suppository 650 mg  650 mg Rectal Q6H PRN Stoioff, Scott C, MD      . alum & mag hydroxide-simeth (MAALOX/MYLANTA) 200-200-20 MG/5ML suspension 30 mL  30 mL Oral Q6H PRN Caren Griffins, MD   30 mL at 11/07/20 2049  . atorvastatin (LIPITOR) tablet 40 mg  40 mg Oral QHS Caren Griffins, MD   40 mg at 11/07/20 2041  . cefTRIAXone (ROCEPHIN) 1 g in sodium chloride 0.9 % 100 mL IVPB  1 g Intravenous Q24H Stoioff, Scott C, MD 200 mL/hr at 11/07/20 2047 1 g at 11/07/20 2047  . enoxaparin (LOVENOX) injection 45 mg  0.5 mg/kg Subcutaneous Q24H Stoioff, Scott C, MD   45 mg at 11/08/20  3818  . HYDROcodone-acetaminophen (NORCO/VICODIN) 5-325 MG per tablet 1-2 tablet  1-2 tablet Oral Q4H PRN Stoioff, Scott C, MD      . insulin aspart (novoLOG) injection 0-15 Units  0-15 Units Subcutaneous TID WC Stoioff, Ronda Fairly, MD   5 Units at 11/08/20 0921  . insulin aspart (novoLOG) injection 0-5 Units  0-5 Units Subcutaneous QHS Stoioff, Scott C, MD      . metoprolol tartrate (LOPRESSOR) tablet 12.5 mg  12.5 mg Oral BID Caren Griffins, MD   12.5 mg at 11/08/20 0919  . morphine 2 MG/ML injection 2 mg  2 mg Intravenous Q2H PRN Stoioff, Scott C, MD      . ondansetron (ZOFRAN) tablet 4 mg  4 mg Oral Q6H PRN Stoioff, Scott C, MD       Or  . ondansetron (ZOFRAN) injection 4 mg  4 mg Intravenous Q6H PRN Stoioff, Scott C, MD      . sertraline (ZOLOFT) tablet 50 mg  50 mg Oral Daily Caren Griffins, MD   50 mg at 11/08/20 0919  . zolpidem (AMBIEN) tablet 5 mg  5 mg Oral QHS PRN Athena Masse, MD   5 mg at 11/07/20 0117   Objective: Vital signs in last 24  hours: Temp:  [97.9 F (36.6 C)-99.8 F (37.7 C)] 99.8 F (37.7 C) (04/04 0817) Pulse Rate:  [78-90] 85 (04/04 0817) Resp:  [16-18] 18 (04/04 0817) BP: (121-140)/(72-80) 140/80 (04/04 0817) SpO2:  [95 %-99 %] 99 % (04/04 0817)  Intake/Output from previous day: 04/03 0701 - 04/04 0700 In: 1500 [P.O.:1500] Out: 2200 [Urine:2200] Intake/Output this shift: Total I/O In: 240 [P.O.:240] Out: -   Physical Exam Vitals and nursing note reviewed.  Constitutional:      General: She is not in acute distress.    Appearance: She is not ill-appearing, toxic-appearing or diaphoretic.  HENT:     Head: Normocephalic and atraumatic.  Pulmonary:     Effort: Pulmonary effort is normal. No respiratory distress.  Skin:    General: Skin is warm and dry.  Neurological:     Mental Status: She is alert and oriented to person, place, and time.  Psychiatric:        Mood and Affect: Mood normal.        Behavior: Behavior normal.    Lab  Results:  Recent Labs    11/07/20 0413 11/08/20 0400  WBC 14.2* 9.9  HGB 11.7* 11.6*  HCT 35.5* 34.6*  PLT 218 217   BMET Recent Labs    11/07/20 0413 11/08/20 0400  NA 136 137  K 3.1* 3.7  CL 100 104  CO2 26 26  GLUCOSE 175* 162*  BUN 16 14  CREATININE 1.01* 0.88  CALCIUM 8.7* 8.9   PT/INR Recent Labs    11/05/20 1832 11/06/20 0125  LABPROT 13.2 13.9  INR 1.0 1.1   Assessment & Plan: 80 year old female s/p left ureteral stent placement with Dr. Bernardo Heater for management of a 10 mm proximal left ureteral stone associated with sepsis.  Patient is clinically improving on culture appropriate antibiotics and tolerating her stent well without any discomfort.  We discussed that she will require outpatient ureteroscopy with laser lithotripsy and stent exchange in 2 to 3 weeks for definitive management of her stone.  I explained that she will be discharged on oral antibiotics.  She expressed understanding, no acute concerns today.  Okay for discharge from the urologic perspective.  She will need to be transitioned to oral antibiotics for total of 14 days of culture appropriate therapy.  Debroah Loop, PA-C 11/08/2020

## 2020-11-08 NOTE — Plan of Care (Signed)

## 2020-11-09 ENCOUNTER — Other Ambulatory Visit: Payer: Self-pay | Admitting: Family Medicine

## 2020-11-09 ENCOUNTER — Telehealth: Payer: Self-pay

## 2020-11-09 DIAGNOSIS — E119 Type 2 diabetes mellitus without complications: Secondary | ICD-10-CM

## 2020-11-09 NOTE — Telephone Encounter (Signed)
Transition Care Management Follow-up Telephone Call  Date of discharge and from where: [facility] on 11/09/20  How have you been since you were released from the hospital? Per daugher since pt was d/c yesterday, she has been feeling tired and weak. Appetite has decreased. Pt c/o of ongoing burping yesterday. Daughter picked pt up some ginger ale and had her sit up instead of laying down and it helped. Daughter states pt slept ok last night. Daughter is unsure if pt has had a BM since getting home. Pt has started on Miralax (as needed). Declines pain, urinary s/s, fever, SOB or n/v/d. Pt is taking Augmentin twice daily and is tolerating well.  Any questions or concerns? No   Items Reviewed:  Did the pt receive and understand the discharge instructions provided? Yes   Medications obtained and verified? No declined reviewing at this time. Will verify at [facility]. Did verify new med prescribed (Augmentin BID).  Any new allergies since your discharge? No   Dietary orders reviewed? N/A  Do you have support at home? Yes   Other (ie: DME, Home Health, etc): N/A  Functional Questionnaire: (I = Independent and D = Dependent)  Bathing/Dressing- I   Meal Prep- I  Eating- I  Maintaining continence- I  Transferring/Ambulation- D, currently using a walker until she gets stable.  Managing Meds- I, daughter checks behind pt.   Follow up appointments reviewed:    PCP Hospital f/u appt confirmed? Yes  scheduled to see Laverna Peace on 11/12/20 @ 1:20 PM.  [facility] f/u appt confirmed? Yes   Are transportation arrangements needed? No   If their condition worsens, is the pt aware to call  their PCP or go to the ED? Yes  Was the patient provided with contact information for the PCP's office or ED? Yes  Was the pt encouraged to call back with questions or concerns? Yes

## 2020-11-09 NOTE — Telephone Encounter (Signed)
HFU scheduled 11/12/20 @ 1:20 PM with Sharyn Lull Flinchum.

## 2020-11-10 ENCOUNTER — Ambulatory Visit: Payer: Self-pay | Admitting: Family Medicine

## 2020-11-10 LAB — CULTURE, BLOOD (ROUTINE X 2)
Culture: NO GROWTH
Culture: NO GROWTH
Special Requests: ADEQUATE

## 2020-11-11 LAB — CULTURE, BLOOD (SINGLE)
Culture: NO GROWTH
Special Requests: ADEQUATE

## 2020-11-12 ENCOUNTER — Ambulatory Visit (INDEPENDENT_AMBULATORY_CARE_PROVIDER_SITE_OTHER): Payer: Medicare HMO | Admitting: Adult Health

## 2020-11-12 ENCOUNTER — Encounter: Payer: Self-pay | Admitting: Adult Health

## 2020-11-12 ENCOUNTER — Other Ambulatory Visit: Payer: Self-pay

## 2020-11-12 VITALS — BP 125/63 | HR 80 | Temp 98.1°F | Resp 16 | Wt 190.0 lb

## 2020-11-12 DIAGNOSIS — N390 Urinary tract infection, site not specified: Secondary | ICD-10-CM

## 2020-11-12 LAB — POCT URINALYSIS DIPSTICK
Bilirubin, UA: NEGATIVE
Glucose, UA: NEGATIVE
Ketones, UA: NEGATIVE
Nitrite, UA: NEGATIVE
Protein, UA: NEGATIVE
Spec Grav, UA: <=1.005 — AB (ref 1.010–1.025)
Urobilinogen, UA: 0.2 E.U./dL
pH, UA: 6 (ref 5.0–8.0)

## 2020-11-12 MED ORDER — CEPHALEXIN 500 MG PO CAPS: 500.0000 mg | ORAL_CAPSULE | Freq: Three times a day (TID) | ORAL | 0 refills | Status: DC

## 2020-11-12 NOTE — Progress Notes (Signed)
Established patient visit   Patient: Leah Schaefer   DOB: 08/03/1941   80 y.o. Female  MRN: [id number] Visit Date: 11/12/2020  Today's healthcare provider: Marcille Buffy, FNP   Chief Complaint  Patient presents with  . Hospitalization Follow-up   Subjective    HPI  Follow up Hospitalization  Patient was admitted to [facility] on 11/05/20 and discharged on 11/08/20. She was treated for Severe sepsis, complicated UTI, hydronephrosis with renal ureter calculus obstruction . Treatment for this included She has been placed on ceftriaxone while hospitalized, cultures resulted in Proteis resistant to Nitrofurantoin but otherwise sensitive. She was narrowed to Augmentin for 8 more days for a total of 10 day course. She is afebrile, WBC normalized, she is able to tolerate a regular diet, she is feeling back to baseline and will be discharged home in stable condition. Dr Bernardo Heater will follow up in 2 weeks for definitive stone removal  . Telephone follow up was done on 11/09/2020 She reports excellent compliance with treatment. She reports this condition is improved. Patient reports that she feels well today.  She is accompanied by her daughter.  Proteus Mirabella's was cultured in urine.  Today in the office she still has leukocytes and urine and we will send that for culture   Patient  denies any fever, body aches,chills, rash, chest pain, shortness of breath, nausea, vomiting, or diarrhea.  Denies dizziness, lightheadedness, pre syncopal or syncopal episodes.  ----------------------- -  Patient Active Problem List   Diagnosis Date Noted  . Severe sepsis ([place]) 11/05/2020  . Complicated UTI (urinary tract infection) 11/05/2020  . Hydronephrosis with renal and ureteral calculus obstruction 11/05/2020  . History of breast cancer 11/05/2020  . History of CVA (cerebrovascular accident) 11/05/2020  . AKI (acute kidney injury) ([place]) 11/05/2020  . Hemiparesis affecting left side as late  effect of stroke ([place]) 11/05/2020  . Postural dizziness with presyncope 11/05/2020  . Stroke ([place]) 09/06/2020  . Leukocytes in urine 06/09/2020  . Grief 06/09/2020  . Episode of recurrent major depressive disorder ([place]) 06/09/2020  . Urinary symptom or sign 06/09/2020  . Left sided numbness 11/06/2018  . 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 Umbilical pain 63/81/7711  . Breast mass, left 04/07/2016  . Controlled type 2 diabetes mellitus without complication ([place]) 65/79/0383  . Allergic rhinitis 03/30/2015  . Anxiety 03/30/2015  . Carotid arterial disease ([place]) 03/30/2015  . Diabetes mellitus, type 2 ([place]) 03/30/2015  . Essential (primary) hypertension 03/30/2015  . Hypercholesteremia 03/30/2015  . Left leg pain 03/30/2015  . LBP (low back pain) 03/30/2015  . Neuropathic pain 03/30/2015  . Burning or prickling sensation 03/30/2015  . Neuralgia neuritis, sciatic nerve 03/30/2015  . Skin cyst 11/24/2014  . Carcinoma of upper-outer quadrant of right breast in female, estrogen receptor positive ([place]) 08/17/2011  . Allergic reaction 11/29/2009  . Adaptation reaction 04/30/2009  . Acid reflux 04/30/2009  . Stricture and stenosis of cervix 01/28/2009   Past Medical History:  Diagnosis Date  . Breast cancer ([place]) 2013   LEFT breast with chemo and rad tx  . Breast cancer ([place]) 2017   right breast with rad tx  . Breast cancer of upper-inner quadrant of right female breast ([place]) 09/21/2015   T1bN0 " 62m; ER100%, PR 90%, HER-2/neu not overexpressed.  . Cystitis   . Diabetes mellitus without complication ([facility])    NO MEDS-DIET CONTROLLED  . GERD (gastroesophageal reflux disease)   . Hernia 2011  . Hyperlipidemia   .  Hypertension   . Malignant neoplasm of upper-inner quadrant of female breast ([place]) 08/17/2011   Left, T2 (2.3 cm) N0, triple negative. Chemotherapy/post wide excision whole breast radiation.  . 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  Personal history of chemotherapy 2013   LEFT BREAST  . Personal history of malignant neoplasm of breast   . Personal history of radiation therapy 2017    Rt, 2013 had on left  . Vertigo    No Known Allergies     Medications: Outpatient Medications Prior to Visit  Medication Sig  . acetaminophen (TYLENOL) 500 MG tablet Take 500 mg by mouth every 6 (six) hours as needed for mild pain or headache.   Marland Kitchen atorvastatin (LIPITOR) 40 MG tablet TAKE 1 TABLET EVERY EVENING  . bimatoprost (LUMIGAN) 0.01 % SOLN Place 1 drop into both eyes at bedtime.   . Calcium Carb-Cholecalciferol 600-800 MG-UNIT TABS Take 1 tablet by mouth 2 (two) times daily.  . chlorthalidone (HYGROTON) 25 MG tablet Take 1 tablet (25 mg total) by mouth daily.  . cholecalciferol (VITAMIN D) 1000 units tablet Take 1,000 Units by mouth daily.  . clopidogrel (PLAVIX) 75 MG tablet Take 75 mg by mouth daily.  . cyanocobalamin 500 MCG tablet Take 1,000 mcg by mouth daily.   Marland Kitchen gabapentin (NEURONTIN) 300 MG capsule TAKE 2 CAPSULES AT BEDTIME  . latanoprost (XALATAN) 0.005 % ophthalmic solution Place 1 drop into both eyes at bedtime.   Marland Kitchen letrozole (FEMARA) 2.5 MG tablet TAKE 1 TABLET (2.5 MG TOTAL) BY MOUTH DAILY.  Marland Kitchen losartan (COZAAR) 50 MG tablet Take 1 tablet (50 mg total) by mouth daily.  . meclizine (ANTIVERT) 25 MG tablet Take 0.5 tablets (12.5 mg total) by mouth 3 (three) times daily as needed for dizziness.  . metFORMIN (GLUCOPHAGE) 1000 MG tablet TAKE 1 TABLET TWICE DAILY  WITH  A  MEAL  . metoprolol tartrate (LOPRESSOR) 25 MG tablet TAKE 1/2 TABLET (12.5 MG TOTAL) TWICE DAILY  . Multiple Vitamin (MULTIVITAMIN WITH MINERALS) TABS tablet Take 1 tablet by mouth daily.  Marland Kitchen omega-3 acid ethyl esters (LOVAZA) 1 g capsule Take 1 g by mouth daily.   . pantoprazole (PROTONIX) 40 MG tablet TAKE 1 TABLET TWICE DAILY  . pyridoxine (B-6) 100 MG tablet Take 100 mg by mouth daily.  . sertraline (ZOLOFT) 100 MG tablet Take 0.5 tablets (50 mg  total) by mouth daily. Doctor increased dose to 115m 05/2020  but patient reports that she never increased the dose. She continued taking 541mdaily.  . vitamin C (ASCORBIC ACID) 500 MG tablet Take 1,000 mg by mouth daily.  . vitamin E 400 UNIT capsule Take 400 Units by mouth daily.  . [DISCONTINUED] amoxicillin-clavulanate (AUGMENTIN) 875-125 MG tablet Take 1 tablet by mouth every 12 (twelve) hours for 8 days.   No facility-administered medications prior to visit.    Review of Systems  Constitutional: Negative.   HENT: Negative.   Respiratory: Negative.   Cardiovascular: Negative.   Gastrointestinal: Negative.   Genitourinary: Negative.   Musculoskeletal: Negative.   Neurological: Negative.   Hematological: Negative.   Psychiatric/Behavioral: Negative.     Last CBC Lab Results  Component Value Date   WBC 9.9 11/08/2020   HGB 11.6 (L) 11/08/2020   HCT 34.6 (L) 11/08/2020   MCV 90.6 11/08/2020   MCH 30.4 11/08/2020   RDW 12.3 11/08/2020   PLT 217 0486/57/8469 Last metabolic panel Lab Results  Component Value  Date   GLUCOSE 162 (H) 11/08/2020   NA 137 11/08/2020   K 3.7 11/08/2020   CL 104 11/08/2020   CO2 26 11/08/2020   BUN 14 11/08/2020   CREATININE 0.88 11/08/2020   GFRNONAA >60 11/08/2020   GFRAA 83 05/31/2020   CALCIUM 8.9 11/08/2020   PROT 7.9 11/05/2020   ALBUMIN 4.4 11/05/2020   LABGLOB 2.7 05/31/2020   AGRATIO 1.7 05/31/2020   BILITOT 0.9 11/05/2020   ALKPHOS 52 11/05/2020   AST 18 11/05/2020   ALT 15 11/05/2020   ANIONGAP 7 11/08/2020   Last lipids Lab Results  Component Value Date   CHOL 105 09/07/2020   HDL 30 (L) 09/07/2020   LDLCALC 57 09/07/2020   TRIG 89 09/07/2020   CHOLHDL 3.5 09/07/2020   Last hemoglobin A1c Lab Results  Component Value Date   HGBA1C 7.6 (H) 09/07/2020   Last thyroid functions Lab Results  Component Value Date   TSH 1.760 05/31/2020   Last vitamin D No results found for: 25OHVITD2, 25OHVITD3, VD25OH Last  vitamin B12 and Folate No results found for: VITAMINB12, FOLATE     Objective    BP 125/63   Pulse 80   Temp 98.1 F (36.7 C) (Oral)   Resp 16   Wt 190 lb (86.2 kg)   SpO2 100%   BMI 30.67 kg/m  BP Readings from Last 3 Encounters:  11/12/20 125/63  11/08/20 133/85  09/13/20 125/83   Wt Readings from Last 3 Encounters:  11/12/20 190 lb (86.2 kg)  11/06/20 189 lb 11.2 oz (86 kg)  09/13/20 189 lb (85.7 kg)       Physical Exam Vitals reviewed.  Constitutional:      General: She is not in acute distress.    Appearance: Normal appearance. She is obese. She is not ill-appearing, toxic-appearing or diaphoretic.  HENT:     Head: Normocephalic and atraumatic.     Right Ear: There is no impacted cerumen.     Left Ear: There is no impacted cerumen.     Nose: Nose normal.     Mouth/Throat:     Pharynx: Oropharynx is clear. No oropharyngeal exudate or posterior oropharyngeal erythema.  Eyes:     General: No scleral icterus.    Extraocular Movements: Extraocular movements intact.     Conjunctiva/sclera: Conjunctivae normal.     Pupils: Pupils are equal, round, and reactive to light.  Neck:     Vascular: No carotid bruit.  Cardiovascular:     Rate and Rhythm: Normal rate and regular rhythm.     Pulses: Normal pulses.     Heart sounds: Normal heart sounds. No murmur heard. No friction rub. No gallop.   Pulmonary:     Effort: Pulmonary effort is normal. No respiratory distress.     Breath sounds: Normal breath sounds. No stridor. No wheezing, rhonchi or rales.  Chest:     Chest wall: No tenderness.  Abdominal:     General: There is no distension.     Palpations: Abdomen is soft. There is no mass.     Tenderness: There is no abdominal tenderness. There is no right CVA tenderness, left CVA tenderness, guarding or rebound.     Hernia: No hernia is present.  Musculoskeletal:        General: Normal range of motion.     Cervical back: Normal range of motion and neck supple. No  rigidity or tenderness.     Right lower leg: No edema.  Lymphadenopathy:  Cervical: No cervical adenopathy.  Skin:    General: Skin is warm.     Findings: No erythema or rash.  Neurological:     General: No focal deficit present.     Mental Status: She is alert and oriented to person, place, and time.  Psychiatric:        Mood and Affect: Mood normal.        Behavior: Behavior normal.        Thought Content: Thought content normal.        Judgment: Judgment normal.       Results for orders placed or performed in visit on 11/12/20  POCT urinalysis dipstick  Result Value Ref Range   Color, UA Yellow    Clarity, UA clear    Glucose, UA Negative Negative   Bilirubin, UA negative    Ketones, UA negative    Spec Grav, UA <=1.005 (A) 1.010 - 1.025   Blood, UA non hemolyzed trace    pH, UA 6.0 5.0 - 8.0   Protein, UA Negative Negative   Urobilinogen, UA 0.2 0.2 or 1.0 E.U./dL   Nitrite, UA negative    Leukocytes, UA Small (1+) (A) Negative   Appearance     Odor      Assessment & Plan     Urinary tract infection without hematuria, site unspecified - Plan: POCT urinalysis dipstick, Urine Culture, CBC with Differential/Platelet, Comprehensive Metabolic Panel (CMET), cephALEXin (KEFLEX) 500 MG capsule  Meds ordered this encounter  Medications  . cephALEXin (KEFLEX) 500 MG capsule    Sig: Take 1 capsule (500 mg total) by mouth 3 (three) times daily.    Dispense:  21 capsule    Refill:  0  She already has a follow-up with Dr. Bernardo Heater in and around 11/22/2020.  She will keep that appointment and if all is good she will cancel this appointment if needed she can also keep this appointment for follow-up if she needs it.  She is advised of red flag symptoms as below as well as daughter and will call if any other symptoms occur and will also seek treatment immediately if she cannot reach the office at an outside medical facility.  Leukocytes was found in urine today on dipstick and  will send that for culture, and starting her on Keflex and stopping Augmentin. Return in about 3 weeks (around 12/03/2020), or if symptoms worsen or fail to improve, for at any time for any worsening symptoms, Go to Emergency room/ urgent care if worse.     The entirety of the information documented in the History of Present Illness, Review of Systems and Physical Exam were personally obtained by me. Portions of this information were initially documented by the CMA and reviewed by me for thoroughness and accuracy.   Red Flags discussed. The patient was given clear instructions to go to ER or return to medical center if any red flags develop, symptoms do not improve, worsen or new problems develop. They verbalized understanding.   Marcille Buffy, [place] [phone] (phone) [phone] (fax)  [place]

## 2020-11-12 NOTE — Patient Instructions (Signed)
Urinary Tract Infection, Adult A urinary tract infection (UTI) is an infection of any part of the urinary tract. The urinary tract includes:  The kidneys.  The ureters.  The bladder.  The urethra. These organs make, store, and get rid of pee (urine) in the body. What are the causes? This infection is caused by germs (bacteria) in your genital area. These germs grow and cause swelling (inflammation) of your urinary tract. What increases the risk? The following factors may make you more likely to develop this condition:  Using a small, thin tube (catheter) to drain pee.  Not being able to control when you pee or poop (incontinence).  Being female. If you are female, these things can increase the risk: ? Using these methods to prevent pregnancy:  A medicine that kills sperm (spermicide).  A device that blocks sperm (diaphragm). ? Having low levels of a female hormone (estrogen). ? Being pregnant. You are more likely to develop this condition if:  You have genes that add to your risk.  You are sexually active.  You take antibiotic medicines.  You have trouble peeing because of: ? A prostate that is bigger than normal, if you are female. ? A blockage in the part of your body that drains pee from the bladder. ? A kidney stone. ? A nerve condition that affects your bladder. ? Not getting enough to drink. ? Not peeing often enough.  You have other conditions, such as: ? Diabetes. ? A weak disease-fighting system (immune system). ? Sickle cell disease. ? Gout. ? Injury of the spine. What are the signs or symptoms? Symptoms of this condition include:  Needing to pee right away.  Peeing small amounts often.  Pain or burning when peeing.  Blood in the pee.  Pee that smells bad or not like normal.  Trouble peeing.  Pee that is cloudy.  Fluid coming from the vagina, if you are female.  Pain in the belly or lower back. Other symptoms include:  Vomiting.  Not  feeling hungry.  Feeling mixed up (confused). This may be the first symptom in older adults.  Being tired and grouchy (irritable).  A fever.  Watery poop (diarrhea). How is this treated?  Taking antibiotic medicine.  Taking other medicines.  Drinking enough water. In some cases, you may need to see a specialist. Follow these instructions at home: Medicines  Take over-the-counter and prescription medicines only as told by your doctor.  If you were prescribed an antibiotic medicine, take it as told by your doctor. Do not stop taking it even if you start to feel better. General instructions  Make sure you: ? Pee until your bladder is empty. ? Do not hold pee for a long time. ? Empty your bladder after sex. ? Wipe from front to back after peeing or pooping if you are a female. Use each tissue one time when you wipe.  Drink enough fluid to keep your pee pale yellow.  Keep all follow-up visits.   Contact a doctor if:  You do not get better after 1-2 days.  Your symptoms go away and then come back. Get help right away if:  You have very bad back pain.  You have very bad pain in your lower belly.  You have a fever.  You have chills.  You feeling like you will vomit or you vomit. Summary  A urinary tract infection (UTI) is an infection of any part of the urinary tract.  This condition is caused by   germs in your genital area.  There are many risk factors for a UTI.  Treatment includes antibiotic medicines.  Drink enough fluid to keep your pee pale yellow. This information is not intended to replace advice given to you by your health care provider. Make sure you discuss any questions you have with your health care provider. Document Revised: 03/05/2020 Document Reviewed: 03/05/2020 Elsevier Patient Education  2021 Elsevier Inc. Cephalexin Tablets or Capsules What is this medicine? CEPHALEXIN (sef a LEX in) is a cephalosporin antibiotic. It treats some  infections caused by bacteria. It will not work for colds, the flu, or other viruses. This medicine may be used for other purposes; ask your health care provider or pharmacist if you have questions. COMMON BRAND NAME(S): Biocef, Daxbia, Keflex, Keftab What should I tell my health care provider before I take this medicine? They need to know if you have any of these conditions:  bleeding disorder  kidney disease  liver disease  seizures  stomach or intestine problems like colitis  an unusual or allergic reaction to cephalexin, other penicillin or cephalosporin antibiotics, other medicines, foods, dyes, or preservatives  pregnant or trying to get pregnant  breast-feeding How should I use this medicine? Take this drug by mouth. Take it as directed on the prescription label at the same time every day. You can take it with or without food. If it upsets your stomach, take it with food. Take all of this drug unless your health care provider tells you to stop it early. Keep taking it even if you think you are better. Talk to your health care provider about the use of this drug in children. While it may be prescribed for selected conditions, precautions do apply. Overdosage: If you think you have taken too much of this medicine contact a poison control center or emergency room at once. NOTE: This medicine is only for you. Do not share this medicine with others. What if I miss a dose? If you miss a dose, take it as soon as you can. If it is almost time for your next dose, take only that dose. Do not take double or extra doses. What may interact with this medicine?  probenecid  some other antibiotics This list may not describe all possible interactions. Give your health care provider a list of all the medicines, herbs, non-prescription drugs, or dietary supplements you use. Also tell them if you smoke, drink alcohol, or use illegal drugs. Some items may interact with your medicine. What should  I watch for while using this medicine? Tell your health care provider if your symptoms do not start to get better or if they get worse. Do not treat diarrhea with over the counter products. Contact your health care provider if you have diarrhea that lasts more than 2 days or if it is severe and watery. This medicine may cause serious skin reactions. They can happen weeks to months after starting the medicine. Contact your health care provider right away if you notice fevers or flu-like symptoms with a rash. The rash may be red or purple and then turn into blisters or peeling of the skin. Or, you might notice a red rash with swelling of the face, lips or lymph nodes in your neck or under your arms. If you have diabetes, you may get a false-positive result for sugar in your urine. Check with your health care provider. What side effects may I notice from receiving this medicine? Side effects that you should report to   your doctor or health care provider as soon as possible:  allergic reactions (skin rash, itching or hives; swelling of the face, lips, or tongue)  bloody or watery diarrhea  fever  kidney injury (trouble passing urine or change in the amount of urine)  low red blood cell counts (trouble breathing; feeling faint; lightheaded, falls; unusually weak or tired)  redness, blistering, peeling, or loosening of the skin, including inside the mouth  unusual bruising or bleeding Side effects that usually do not require medical attention (report to your doctor or health care provider if they continue or are bothersome):  headache  dizziness  nausea, vomiting  unusual vaginal discharge, itching, or odor  upset stomach This list may not describe all possible side effects. Call your doctor for medical advice about side effects. You may report side effects to FDA at 1-800-FDA-1088. Where should I keep my medicine? Keep out of the reach of children and pets. Store at room temperature  between 20 and 25 degrees C (68 and 77 degrees F). Throw away any unused drug after the expiration date. NOTE: This sheet is a summary. It may not cover all possible information. If you have questions about this medicine, talk to your doctor, pharmacist, or health care provider.  2021 Elsevier/Gold Standard (2019-05-29 15:26:31)  

## 2020-11-13 LAB — CBC WITH DIFFERENTIAL/PLATELET
Basophils Absolute: 0.1 10*3/uL (ref 0.0–0.2)
Basos: 1 %
EOS (ABSOLUTE): 0.2 10*3/uL (ref 0.0–0.4)
Eos: 2 %
Hematocrit: 37.3 % (ref 34.0–46.6)
Hemoglobin: 12.8 g/dL (ref 11.1–15.9)
Immature Grans (Abs): 0.1 10*3/uL (ref 0.0–0.1)
Immature Granulocytes: 1 %
Lymphocytes Absolute: 2.5 10*3/uL (ref 0.7–3.1)
Lymphs: 23 %
MCH: 29.9 pg (ref 26.6–33.0)
MCHC: 34.3 g/dL (ref 31.5–35.7)
MCV: 87 fL (ref 79–97)
Monocytes Absolute: 0.9 10*3/uL (ref 0.1–0.9)
Monocytes: 8 %
Neutrophils Absolute: 7.2 10*3/uL — ABNORMAL HIGH (ref 1.4–7.0)
Neutrophils: 65 %
Platelets: 356 10*3/uL (ref 150–450)
RBC: 4.28 x10E6/uL (ref 3.77–5.28)
RDW: 12.3 % (ref 11.7–15.4)
WBC: 11.1 10*3/uL — ABNORMAL HIGH (ref 3.4–10.8)

## 2020-11-13 LAB — COMPREHENSIVE METABOLIC PANEL
ALT: 40 IU/L — ABNORMAL HIGH (ref 0–32)
AST: 24 IU/L (ref 0–40)
Albumin/Globulin Ratio: 1.1 — ABNORMAL LOW (ref 1.2–2.2)
Albumin: 4 g/dL (ref 3.7–4.7)
Alkaline Phosphatase: 85 IU/L (ref 44–121)
BUN/Creatinine Ratio: 11 — ABNORMAL LOW (ref 12–28)
BUN: 11 mg/dL (ref 8–27)
Bilirubin Total: 0.3 mg/dL (ref 0.0–1.2)
CO2: 22 mmol/L (ref 20–29)
Calcium: 10 mg/dL (ref 8.7–10.3)
Chloride: 98 mmol/L (ref 96–106)
Creatinine, Ser: 1 mg/dL (ref 0.57–1.00)
Globulin, Total: 3.6 g/dL (ref 1.5–4.5)
Glucose: 102 mg/dL — ABNORMAL HIGH (ref 65–99)
Potassium: 4 mmol/L (ref 3.5–5.2)
Sodium: 140 mmol/L (ref 134–144)
Total Protein: 7.6 g/dL (ref 6.0–8.5)
eGFR: 58 mL/min/{1.73_m2} — ABNORMAL LOW (ref >59)

## 2020-11-14 LAB — URINE CULTURE: Organism ID, Bacteria: NO GROWTH

## 2020-11-15 ENCOUNTER — Other Ambulatory Visit: Payer: Self-pay | Admitting: Family Medicine

## 2020-11-15 ENCOUNTER — Telehealth: Payer: Self-pay

## 2020-11-15 DIAGNOSIS — D72829 Elevated white blood cell count, unspecified: Secondary | ICD-10-CM

## 2020-11-15 NOTE — Progress Notes (Signed)
She did have mild white blood cell elevation urinary symptoms was placed on keflex, see if she is feeling better. However culture of urine shows no bacteria.  Repeat CBC in two weeks -please add order.  CMP non fasting - alt mild elevation keep follow up with PCP.   Schedule follow up if not feeling better office visit is advised at anytime.

## 2020-11-15 NOTE — Telephone Encounter (Signed)
Requested medication (s) are due for refill today: ?  Requested medication (s) are on the active medication list: Yes  Last refill:  11/08/20  Future visit scheduled: Yes  Notes to clinic:  Last refill - not clear. Historical provider.    Requested Prescriptions  Pending Prescriptions Disp Refills   clopidogrel (PLAVIX) 75 MG tablet [Pharmacy Med Name: CLOPIDOGREL 75 MG Tablet] 90 tablet     Sig: TAKE 1 TABLET EVERY DAY      Hematology: Antiplatelets - clopidogrel Failed - 11/15/2020  1:48 PM      Failed - Evaluate AST, ALT within 2 months of therapy initiation.      Failed - ALT in normal range and within 360 days    ALT  Date Value Ref Range Status  11/12/2020 40 (H) 0 - 32 IU/L Final   SGPT (ALT)  Date Value Ref Range Status  09/04/2014 34 14 - 63 U/L Final          Passed - AST in normal range and within 360 days    AST  Date Value Ref Range Status  11/12/2020 24 0 - 40 IU/L Final   SGOT(AST)  Date Value Ref Range Status  09/04/2014 21 15 - 37 Unit/L Final          Passed - HCT in normal range and within 180 days    Hematocrit  Date Value Ref Range Status  11/12/2020 37.3 34.0 - 46.6 % Final          Passed - HGB in normal range and within 180 days    Hemoglobin  Date Value Ref Range Status  11/12/2020 12.8 11.1 - 15.9 g/dL Final          Passed - PLT in normal range and within 180 days    Platelets  Date Value Ref Range Status  11/12/2020 356 150 - 450 x10E3/uL Final          Passed - Valid encounter within last 6 months    Recent Outpatient Visits           3 days ago Urinary tract infection without hematuria, site unspecified   [place], Kelby Aline, FNP   2 months ago Cerebrovascular accident (CVA), unspecified mechanism ([place])   [facility] Jerrol Banana., MD   4 months ago Controlled type 2 diabetes mellitus without complication, without long-term current use of insulin [facility])   [facility] Jerrol Banana., MD   5 months ago Urinary tract infection without hematuria, site unspecified   [place], Kelby Aline, FNP   5 months ago [place] Jerrol Banana., MD       Future Appointments             In 1 week Stoioff, Ronda Fairly, MD [place]   In 4 weeks Jerrol Banana., MD [facility], [facility]

## 2020-11-15 NOTE — Telephone Encounter (Signed)
-----   Message from Doreen Beam, [place] sent at 11/15/2020  9:43 AM EDT ----- She did have mild white blood cell elevation urinary symptoms was placed on keflex, see if she is feeling better. However culture of urine shows no bacteria.  Repeat CBC in two weeks -please add order.  CMP non fasting - alt mild elevation keep follow up with PCP.   Schedule follow up if not feeling better office visit is advised at anytime.

## 2020-11-15 NOTE — Telephone Encounter (Signed)
Pt advised.  She reports feeling better today.  CBC ordered.

## 2020-11-21 NOTE — H&P (View-Only) (Signed)
11/22/2020  1:38 PM   Leah Schaefer August 05, 1941 [id number]  Referring provider: Jerrol Banana., MD [address] [place],  [place] [address] Chief Complaint  Patient presents with  . Other     HPI: Leah Schaefer is a 80 y.o. female who presents for follow-up of recent hospital admission.  Today the patient was accompanied by her daughter.   Left ureteral stent placed 11/06/2020 for an obstructing 10 mm left proximal ureteral calculus with sepsis from urinary source  Presents today to discuss definitive stone treatment  No significant stent symptoms  Denies fever, chills  PMH: Past Medical History:  Diagnosis Date  . Breast cancer ([place]) 2013   LEFT breast with chemo and rad tx  . Breast cancer ([place]) 2017   right breast with rad tx  . 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  . Cystitis   . Diabetes mellitus without complication (HCC)    NO MEDS-DIET CONTROLLED  . GERD (gastroesophageal reflux disease)   . Hernia 2011  . Hyperlipidemia   . Hypertension   . [place]ignant [place]   Left, T2 (2.3 cm) N0, triple negative. Chemotherapy/post wide excision whole breast radiation.  . 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 Personal history of chemotherapy 2013   LEFT BREAST  . Personal history of malignant neoplasm of breast   . Personal history of radiation therapy 2017    Rt, 2013 had on left  . Vertigo     Surgical History: Past Surgical History:  Procedure Laterality Date  . BREAST BIOPSY Left 07/18/2011   +  . BREAST BIOPSY Left 09/21/2015   neg  . BREAST BIOPSY Left 04/05/2016   neg  . BREAST BIOPSY Left 03/20/2019   uKoreabx 2 areas /fat necrosis at 9:30 ribbon and 5:30 coil  . BREAST EXCISIONAL BIOPSY Right 08/2015   [place] . BREAST LUMPECTOMY Right  09/30/2015   [facility]CLEAR MARGINS, NEGATIVE LN  . BREAST LUMPECTOMY Left 08/17/2011  . BREAST LUMPECTOMY WITH SENTINEL LYMPH NODE BIOPSY Right 09/30/2015   Procedure: BREAST LUMPECTOMY WITH SENTINEL LYMPH NODE BX;  Surgeon: JRobert Bellow MD;  Location: [facility] ORS;  Service: General;  Laterality: Right;  . BREAST SURGERY Left 2012   wide excision  . BREAST SURGERY Left November 2013   Core biopsy of the upper-outer quadrant showed fat necrosis.  . CHOLECYSTECTOMY  2007  . COLONOSCOPY  2011   Dr OCandace Cruise . COLONOSCOPY WITH PROPOFOL N/A 02/25/2015   Procedure: COLONOSCOPY WITH PROPOFOL;  Surgeon: PHulen Luster MD;  [facility];  Service: Gastroenterology;  Laterality: N/A;  . CYSTOSCOPY WITH STENT PLACEMENT Left 11/05/2020   Procedure: CYSTOSCOPY WITH STENT PLACEMENT;  Surgeon: SAbbie Sons MD;  Location: [facility] ORS;  Service: Urology;  Laterality: Left;  . DILATION AND CURETTAGE OF UTERUS  2004  . HERNIA REPAIR Right 093/79/0240  Umbilical/ventral hernia repaired with 6.4 cm Proceed ventral patch  . PORT-A-CATH REMOVAL    . PORTACATH PLACEMENT  2013  . 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  .Marland KitchenUPPER GI ENDOSCOPY  2011    Home Medications:  Allergies as of 11/22/2020   No  Known Allergies     Medication List       Accurate as of November 22, 2020  1:38 PM. If you have any questions, ask your nurse or doctor.        STOP taking these medications   cephALEXin 500 MG capsule Commonly known as: KEFLEX Stopped by: Abbie Sons, MD     TAKE these medications   acetaminophen 500 MG tablet Commonly known as: TYLENOL Take 500 mg by mouth every 6 (six) hours as needed for mild pain or headache.   atorvastatin 40 MG tablet Commonly known as: LIPITOR TAKE 1 TABLET EVERY EVENING   bimatoprost 0.01 % Soln Commonly known  as: LUMIGAN Place 1 drop into both eyes at bedtime.   Calcium Carb-Cholecalciferol 600-800 MG-UNIT Tabs Take 1 tablet by mouth 2 (two) times daily.   chlorthalidone 25 MG tablet Commonly known as: HYGROTON Take 1 tablet (25 mg total) by mouth daily.   cholecalciferol 1000 units tablet Commonly known as: VITAMIN D Take 1,000 Units by mouth daily.   clopidogrel 75 MG tablet Commonly known as: PLAVIX TAKE 1 TABLET EVERY DAY   gabapentin 300 MG capsule Commonly known as: NEURONTIN TAKE 2 CAPSULES AT BEDTIME   latanoprost 0.005 % ophthalmic solution Commonly known as: XALATAN Place 1 drop into both eyes at bedtime.   letrozole 2.5 MG tablet Commonly known as: FEMARA TAKE 1 TABLET (2.5 MG TOTAL) BY MOUTH DAILY.   losartan 50 MG tablet Commonly known as: COZAAR Take 1 tablet (50 mg total) by mouth daily.   meclizine 25 MG tablet Commonly known as: ANTIVERT Take 0.5 tablets (12.5 mg total) by mouth 3 (three) times daily as needed for dizziness.   metFORMIN 1000 MG tablet Commonly known as: GLUCOPHAGE TAKE 1 TABLET TWICE DAILY  WITH  A  MEAL   metoprolol tartrate 25 MG tablet Commonly known as: LOPRESSOR TAKE 1/2 TABLET (12.5 MG TOTAL) TWICE DAILY   multivitamin with minerals Tabs tablet Take 1 tablet by mouth daily.   omega-3 acid ethyl esters 1 g capsule Commonly known as: LOVAZA Take 1 g by mouth daily.   pantoprazole 40 MG tablet Commonly known as: PROTONIX TAKE 1 TABLET TWICE DAILY   pyridoxine 100 MG tablet Commonly known as: B-6 Take 100 mg by mouth daily.   sertraline 100 MG tablet Commonly known as: ZOLOFT Take 0.5 tablets (50 mg total) by mouth daily. Doctor increased dose to 176m 05/2020  but patient reports that she never increased the dose. She continued taking 559mdaily.   vitamin B-12 500 MCG tablet Commonly known as: CYANOCOBALAMIN Take 1,000 mcg by mouth daily.   vitamin C 500 MG tablet Commonly known as: ASCORBIC ACID Take 1,000 mg by  mouth daily.   vitamin E 180 MG (400 UNITS) capsule Take 400 Units by mouth daily.       Allergies: No Known Allergies  Family History: Family History  Problem Relation Age of Onset  . Lymphoma Father   . Ovarian cancer Sister   . Colon cancer Brother   . Lymphoma Brother   . Cancer Other        stomach  . Cancer Other        stomach  . Breast cancer Neg Hx     Social History:   reports that she has never smoked. She has never used smokeless tobacco. She reports that she does not drink alcohol and does not use drugs.  ROS: Pertinent ROS in HPI.  Physical  Exam: BP 120/75   Pulse 94   Ht 5' 6" (1.676 m)   Wt 197 lb (89.4 kg)   BMI 31.80 kg/m   Constitutional:  Alert, No acute distress. HEENT: [place] AT, moist mucus membranes.  Trachea midline, no masses. Cardiovascular: No clubbing, cyanosis, or edema. Respiratory: Normal respiratory effort, no increased work of breathing. Skin: No rashes, bruises or suspicious lesions. Neurologic: Grossly intact, no focal deficits, moving all 4 extremities. Psychiatric: Normal mood and affect.  Laboratory Data:  Results for orders placed or performed in visit on 11/12/20  Urine Culture   Specimen: Urine   Urine  Result Value Ref Range   Urine Culture, Routine Final report    Organism ID, Bacteria No growth   POCT urinalysis dipstick  Result Value Ref Range   Color, UA Yellow    Clarity, UA clear    Glucose, UA Negative Negative   Bilirubin, UA negative    Ketones, UA negative    Spec Grav, UA <=1.005 (A) 1.010 - 1.025   Blood, UA non hemolyzed trace    pH, UA 6.0 5.0 - 8.0   Protein, UA Negative Negative   Urobilinogen, UA 0.2 0.2 or 1.0 E.U./dL   Nitrite, UA negative    Leukocytes, UA Small (1+) (A) Negative   Appearance     Odor       Assessment & Plan:   1. Left proximal ureteral calculus  - prior stent placement early April for an obstructing calculus with infection   - discussed stone treatment options of  ureteroscopy and shock wave lithotripsy  - pros and cons of each were discussed and she would like to schedule ureteroscopy  - The indications and nature of the planned procedure were discussed as well as the potential  benefits and expected outcome.  - Alternatives have been discussed in detail. The most common complications and side effects were discussed including but not limited to infection/sepsis; blood loss; damage to urethra, bladder, ureter, kidney; need for multiple surgeries; need for prolonged stent placement as well as general anesthesia risks.  All of her questions were answered and she desires to proceed.    - Urine culture was ordered  I, Ardyth Gal, am acting as a scribe for Dr. John Giovanni.   I have reviewed the above documentation for accuracy and completeness, and I agree with the above.    Abbie Sons, [place] [address], [place], [place] [address] [phone]

## 2020-11-21 NOTE — Progress Notes (Addendum)
11/22/2020  1:38 PM   Leah Schaefer 10/30/1940 [id number]  Referring provider: Jerrol Banana., MD [address] [place],  [place] [address] Chief Complaint  Patient presents with  . Other     HPI: Leah Schaefer is a 80 y.o. female who presents for follow-up of recent hospital admission.  Today the patient was accompanied by her daughter.   Left ureteral stent placed 11/06/2020 for an obstructing 10 mm left proximal ureteral calculus with sepsis from urinary source  Presents today to discuss definitive stone treatment  No significant stent symptoms  Denies fever, chills  PMH: Past Medical History:  Diagnosis Date  . Breast cancer ([place]) 2013   LEFT breast with chemo and rad tx  . Breast cancer ([place]) 2017   right breast with rad tx  . 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  . Cystitis   . Diabetes mellitus without complication (HCC)    NO MEDS-DIET CONTROLLED  . GERD (gastroesophageal reflux disease)   . Hernia 2011  . Hyperlipidemia   . Hypertension   . Malignant [place] of [place] ([place]/17[place]   Left, T2 (2.3 cm) N0, triple negative. Chemotherapy/post wide excision whole breast radiation.  . 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 Personal history of chemotherapy 2013   LEFT BREAST  . Personal history of malignant neoplasm of breast   . Personal history of radiation therapy 2017    Rt, 2013 had on left  . Vertigo     Surgical History: Past Surgical History:  Procedure Laterality Date  . BREAST BIOPSY Left 07/18/2011   +  . BREAST BIOPSY Left 09/21/2015   neg  . BREAST BIOPSY Left 04/05/2016   neg  . BREAST BIOPSY Left 03/20/2019   uKoreabx 2 areas /fat necrosis at 9:30 ribbon and 5:30 coil  . BREAST EXCISIONAL BIOPSY Right 08/2015   [place] BREAST LUMPECTOMY Right  09/30/2015   [facility]/CLEAR MARGINS, NEGATIVE LN  . BREAST LUMPECTOMY Left 08/17/2011  . BREAST LUMPECTOMY WITH SENTINEL LYMPH NODE BIOPSY Right 09/30/2015   Procedure: BREAST LUMPECTOMY WITH SENTINEL LYMPH NODE BX;  Surgeon: JRobert Bellow MD;  Location: [facility] ORS;  Service: General;  Laterality: Right;  . BREAST SURGERY Left 2012   wide excision  . BREAST SURGERY Left November 2013   Core biopsy of the upper-outer quadrant showed fat necrosis.  . CHOLECYSTECTOMY  2007  . COLONOSCOPY  2011   Dr OCandace Cruise . COLONOSCOPY WITH PROPOFOL N/A 02/25/2015   Procedure: COLONOSCOPY WITH PROPOFOL;  Surgeon: PHulen Luster MD;  [facility];  Service: Gastroenterology;  Laterality: N/A;  . CYSTOSCOPY WITH STENT PLACEMENT Left 11/05/2020   Procedure: CYSTOSCOPY WITH STENT PLACEMENT;  Surgeon: SAbbie Sons MD;  Location: [facility] OR[facility];  Service: Urology;  Laterality: Left;  . DILATION AND CURETTAGE OF UTERUS  2004  . HERNIA REPAIR Right 093/79/0240  Umbilical/ventral hernia repaired with 6.4 cm Proceed ventral patch  . PORT-A-CATH REMOVAL    . PORTACATH PLACEMENT  2013  . 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  .Marland KitchenUPPER GI ENDOSCOPY  2011    Home Medications:  Allergies as of 11/22/2020   No  Known Allergies     Medication List       Accurate as of November 22, 2020  1:38 PM. If you have any questions, ask your nurse or doctor.        STOP taking these medications   cephALEXin 500 MG capsule Commonly known as: KEFLEX Stopped by: Bakary Bramer C Eryka Dolinger, MD     TAKE these medications   acetaminophen 500 MG tablet Commonly known as: TYLENOL Take 500 mg by mouth every 6 (six) hours as needed for mild pain or headache.   atorvastatin 40 MG tablet Commonly known as: LIPITOR TAKE 1 TABLET EVERY EVENING   bimatoprost 0.01 % Soln Commonly known  as: LUMIGAN Place 1 drop into both eyes at bedtime.   Calcium Carb-Cholecalciferol 600-800 MG-UNIT Tabs Take 1 tablet by mouth 2 (two) times daily.   chlorthalidone 25 MG tablet Commonly known as: HYGROTON Take 1 tablet (25 mg total) by mouth daily.   cholecalciferol 1000 units tablet Commonly known as: VITAMIN D Take 1,000 Units by mouth daily.   clopidogrel 75 MG tablet Commonly known as: PLAVIX TAKE 1 TABLET EVERY DAY   gabapentin 300 MG capsule Commonly known as: NEURONTIN TAKE 2 CAPSULES AT BEDTIME   latanoprost 0.005 % ophthalmic solution Commonly known as: XALATAN Place 1 drop into both eyes at bedtime.   letrozole 2.5 MG tablet Commonly known as: FEMARA TAKE 1 TABLET (2.5 MG TOTAL) BY MOUTH DAILY.   losartan 50 MG tablet Commonly known as: COZAAR Take 1 tablet (50 mg total) by mouth daily.   meclizine 25 MG tablet Commonly known as: ANTIVERT Take 0.5 tablets (12.5 mg total) by mouth 3 (three) times daily as needed for dizziness.   metFORMIN 1000 MG tablet Commonly known as: GLUCOPHAGE TAKE 1 TABLET TWICE DAILY  WITH  A  MEAL   metoprolol tartrate 25 MG tablet Commonly known as: LOPRESSOR TAKE 1/2 TABLET (12.5 MG TOTAL) TWICE DAILY   multivitamin with minerals Tabs tablet Take 1 tablet by mouth daily.   omega-3 acid ethyl esters 1 g capsule Commonly known as: LOVAZA Take 1 g by mouth daily.   pantoprazole 40 MG tablet Commonly known as: PROTONIX TAKE 1 TABLET TWICE DAILY   pyridoxine 100 MG tablet Commonly known as: B-6 Take 100 mg by mouth daily.   sertraline 100 MG tablet Commonly known as: ZOLOFT Take 0.5 tablets (50 mg total) by mouth daily. Doctor increased dose to 100mg 05/2020  but patient reports that she never increased the dose. She continued taking 50mg daily.   vitamin B-12 500 MCG tablet Commonly known as: CYANOCOBALAMIN Take 1,000 mcg by mouth daily.   vitamin C 500 MG tablet Commonly known as: ASCORBIC ACID Take 1,000 mg by  mouth daily.   vitamin E 180 MG (400 UNITS) capsule Take 400 Units by mouth daily.       Allergies: No Known Allergies  Family History: Family History  Problem Relation Age of Onset  . Lymphoma Father   . Ovarian cancer Sister   . Colon cancer Brother   . Lymphoma Brother   . Cancer Other        stomach  . Cancer Other        stomach  . Breast cancer Neg Hx     Social History:   reports that she has never smoked. She has never used smokeless tobacco. She reports that she does not drink alcohol and does not use drugs.  ROS: Pertinent ROS in HPI.  Physical   Exam: BP 120/75   Pulse 94   Ht 5' 6" (1.676 m)   Wt 197 lb (89.4 kg)   BMI 31.80 kg/m   Constitutional:  Alert, No acute distress. HEENT: [place] AT, moist mucus membranes.  Trachea midline, no masses. Cardiovascular: No clubbing, cyanosis, or edema. Respiratory: Normal respiratory effort, no increased work of breathing. Skin: No rashes, bruises or suspicious lesions. Neurologic: Grossly intact, no focal deficits, moving all 4 extremities. Psychiatric: Normal mood and affect.  Laboratory Data:  Results for orders placed or performed in visit on 11/12/20  Urine Culture   Specimen: Urine   Urine  Result Value Ref Range   Urine Culture, Routine Final report    Organism ID, Bacteria No growth   POCT urinalysis dipstick  Result Value Ref Range   Color, UA Yellow    Clarity, UA clear    Glucose, UA Negative Negative   Bilirubin, UA negative    Ketones, UA negative    Spec Grav, UA <=1.005 (A) 1.010 - 1.025   Blood, UA non hemolyzed trace    pH, UA 6.0 5.0 - 8.0   Protein, UA Negative Negative   Urobilinogen, UA 0.2 0.2 or 1.0 E.U./dL   Nitrite, UA negative    Leukocytes, UA Small (1+) (A) Negative   Appearance     Odor       Assessment & Plan:   1. Left proximal ureteral calculus  - prior stent placement early April for an obstructing calculus with infection   - discussed stone treatment options of  ureteroscopy and shock wave lithotripsy  - pros and cons of each were discussed and she would like to schedule ureteroscopy  - The indications and nature of the planned procedure were discussed as well as the potential  benefits and expected outcome.  - Alternatives have been discussed in detail. The most common complications and side effects were discussed including but not limited to infection/sepsis; blood loss; damage to urethra, bladder, ureter, kidney; need for multiple surgeries; need for prolonged stent placement as well as general anesthesia risks.  All of her questions were answered and she desires to proceed.    - Urine culture was ordered  I, Lydia Carbuccia, am acting as a scribe for Dr. Laraya Pestka.   I have reviewed the above documentation for accuracy and completeness, and I agree with the above.    Orlanda Frankum C Sovereign Ramiro, MD   [facility] [address], [address] [place], [place] [address] [phone]  

## 2020-11-22 ENCOUNTER — Other Ambulatory Visit: Payer: Self-pay

## 2020-11-22 ENCOUNTER — Ambulatory Visit: Payer: Medicare HMO | Admitting: Urology

## 2020-11-22 ENCOUNTER — Encounter: Payer: Self-pay | Admitting: Urology

## 2020-11-22 VITALS — BP 120/75 | HR 94 | Ht 66.0 in | Wt 197.0 lb

## 2020-11-22 DIAGNOSIS — N201 Calculus of ureter: Secondary | ICD-10-CM | POA: Diagnosis not present

## 2020-11-22 DIAGNOSIS — N200 Calculus of kidney: Secondary | ICD-10-CM | POA: Diagnosis not present

## 2020-11-22 LAB — URINALYSIS, COMPLETE
Bilirubin, UA: NEGATIVE
Glucose, UA: NEGATIVE
Ketones, UA: NEGATIVE
Nitrite, UA: NEGATIVE
Specific Gravity, UA: 1.01 (ref 1.005–1.030)
Urobilinogen, Ur: 0.2 mg/dL (ref 0.2–1.0)
pH, UA: 5.5 (ref 5.0–7.5)

## 2020-11-22 LAB — MICROSCOPIC EXAMINATION
RBC, Urine: >30 /hpf — AB (ref 0–2)
WBC, UA: >30 /hpf — AB (ref 0–5)

## 2020-11-23 ENCOUNTER — Telehealth: Payer: Self-pay | Admitting: Urology

## 2020-11-23 NOTE — Telephone Encounter (Signed)
LMOM for pt. To return my call. Important that I speak with this pt. When she calls back because provider wants her added on for next week.

## 2020-11-24 ENCOUNTER — Other Ambulatory Visit: Payer: Self-pay | Admitting: Urology

## 2020-11-24 DIAGNOSIS — N201 Calculus of ureter: Secondary | ICD-10-CM

## 2020-11-26 LAB — CULTURE, URINE COMPREHENSIVE

## 2020-11-28 MED ORDER — SULFAMETHOXAZOLE-TRIMETHOPRIM 800-160 MG PO TABS: 1.0000 | ORAL_TABLET | Freq: Two times a day (BID) | ORAL | 0 refills | Status: DC

## 2020-11-28 NOTE — Telephone Encounter (Signed)
Urine culture was positive for Proteus.  This most likely represents colonization.  Rx Septra DS sent to pharmacy ([facility]).  Have her start Thursday, 12/02/2020

## 2020-11-29 ENCOUNTER — Other Ambulatory Visit
Admission: RE | Admit: 2020-11-29 | Discharge: 2020-11-29 | Disposition: A | Discharge status: Home / Self Care | Payer: Medicare HMO | Source: Ambulatory Visit | Attending: Urology | Admitting: Urology

## 2020-11-29 ENCOUNTER — Other Ambulatory Visit: Payer: Self-pay

## 2020-11-29 HISTORY — DX: Cerebral infarction, unspecified: I63.9

## 2020-11-29 HISTORY — DX: Personal history of other diseases of the digestive system: Z87.19

## 2020-11-29 HISTORY — DX: Unspecified osteoarthritis, unspecified site: M19.90

## 2020-11-29 HISTORY — DX: Personal history of urinary calculi: Z87.442

## 2020-11-29 NOTE — Telephone Encounter (Signed)
Notified patient Daughter as instructed,

## 2020-11-29 NOTE — Patient Instructions (Addendum)
Your procedure is scheduled on: 12/07/20- Tuesday Report to the Registration Desk on the 1st floor of the [facility]. To find out your arrival time, please call [phone] between 1PM - 3PM on: 12/06/20 - Monday  REMEMBER: Instructions that are not followed completely may result in serious medical risk, up to and including death; or upon the discretion of your surgeon and anesthesiologist your surgery may need to be rescheduled.  Do not eat food or drink any fluids after midnight the night before surgery.  No gum chewing, lozengers or hard candies.  TAKE THESE MEDICATIONS THE MORNING OF SURGERY WITH A SIP OF WATER: - pantoprazole (PROTONIX) 40 MG tablet, take one the night before and one on the morning of surgery - helps to prevent nausea after surgery. - sertraline (ZOLOFT) 100 MG tablet - metoprolol tartrate (LOPRESSOR) 25 MG tablet - letrozole (FEMARA) 2.5 MG tablet.  Stop Metformin 2 days prior to surgery. Do not take on 05/01, 05/02, and do not take the morning of surgery.  Follow recommendations from Cardiologist, Pulmonologist or PCP regarding stopping Aspirin, Coumadin, Plavix, Eliquis, Pradaxa, or Pletal. Stop Plavix beginning 11/29/20.Marland Kitchen   One week prior to surgery: Stop Anti-inflammatories (NSAIDS) such as Advil, Aleve, Ibuprofen, Motrin, Naproxen, Naprosyn and Aspirin based products such as Excedrin, Goodys Powder, BC Powder.  - May Take acetaminophen (TYLENOL) 500 MG tablet as directed if need.  Stop ANY OVER THE COUNTER supplements until after surgery. Vitamin C, Vitamin E , omega-3 acid , Multiple Vitamin (MULTIVITAMIN WITH MINERALS) TABS tablet, pyridoxine (B-6) 100 MG tablet, cyanocobalamin 500 , VITAMIN D, Calcium Carb-Cholecalciferol   No Alcohol for 24 hours before or after surgery.  No Smoking including e-cigarettes for 24 hours prior to surgery.  No chewable tobacco products for at least 6 hours prior to surgery.  No nicotine patches on the day of  surgery.  Do not use any "recreational" drugs for at least a week prior to your surgery.  Please be advised that the combination of cocaine and anesthesia may have negative outcomes, up to and including death. If you test positive for cocaine, your surgery will be cancelled.  On the morning of surgery brush your teeth with toothpaste and water, you may rinse your mouth with mouthwash if you wish. Do not swallow any toothpaste or mouthwash.  Do not wear jewelry, make-up, hairpins, clips or nail polish.  Do not wear lotions, powders, or perfumes.   Do not shave body from the neck down 48 hours prior to surgery just in case you cut yourself which could leave a site for infection.  Also, freshly shaved skin may become irritated if using the CHG soap.  Contact lenses, hearing aids and dentures may not be worn into surgery.  Do not bring valuables to the hospital. [facility] is not responsible for any missing/lost belongings or valuables.   Notify your doctor if there is any change in your medical condition (cold, fever, infection).  Wear comfortable clothing (specific to your surgery type) to the hospital.  Plan for stool softeners for home use; pain medications have a tendency to cause constipation. You can also help prevent constipation by eating foods high in fiber such as fruits and vegetables and drinking plenty of fluids as your diet allows.  After surgery, you can help prevent lung complications by doing breathing exercises.  Take deep breaths and cough every 1-2 hours. Your doctor may order a device called an Incentive Spirometer to help you take deep breaths. When coughing  or sneezing, hold a pillow firmly against your incision with both hands. This is called "splinting." Doing this helps protect your incision. It also decreases belly discomfort.  If you are being admitted to the hospital overnight, leave your suitcase in the car. After surgery it may be brought to your  room.  If you are being discharged the day of surgery, you will not be allowed to drive home. You will need a responsible adult (18 years or older) to drive you home and stay with you that night.   If you are taking public transportation, you will need to have a responsible adult (18 years or older) with you. Please confirm with your physician that it is acceptable to use public transportation.   Please call the [facility] at [phone] if you have any questions about these instructions.  Surgery Visitation Policy:  Patients undergoing a surgery or procedure may have one family member or support person with them as long as that person is not COVID-19 positive or experiencing its symptoms.  That person may remain in the waiting area during the procedure.  Inpatient Visitation:    Visiting hours are 7 a.m. to 8 p.m. Inpatients will be allowed two visitors daily. The visitors may change each day during the patient's stay. No visitors under the age of 6. Any visitor under the age of 16 must be accompanied by an adult. The visitor must pass COVID-19 screenings, use hand sanitizer when entering and exiting the patient's room and wear a mask at all times, including in the patient's room. Patients must also wear a mask when staff or their visitor are in the room. Masking is required regardless of vaccination status.

## 2020-11-30 ENCOUNTER — Other Ambulatory Visit: Payer: Self-pay | Admitting: Family Medicine

## 2020-11-30 ENCOUNTER — Encounter: Payer: Self-pay | Admitting: Urology

## 2020-11-30 DIAGNOSIS — I10 Essential (primary) hypertension: Secondary | ICD-10-CM

## 2020-11-30 NOTE — Progress Notes (Signed)
Perioperative Services  Pre-Admission/Anesthesia Testing Clinical Review  Date: 11/30/20  Patient Demographics:  Name: Leah Schaefer DOB:   Feb 24, 1941 MRN:   [id number]  Planned Surgical Procedure(s):    Case: [id number] Date/Time: 12/07/20 0715   Procedure: CYSTOSCOPY/URETEROSCOPY/HOLMIUM LASER/STENT PLACEMENT (Left )   Anesthesia type: Choice   Pre-op diagnosis: Left ureteral calculus   Location: [facility] OR ROOM 10 / [facility] FOR ANESTHESIA GROUP   Surgeons: Abbie Sons, MD    NOTE: Available PAT nursing documentation and vital signs have been reviewed. Clinical nursing staff has updated patient's PMH/PSHx, current medication list, and drug allergies/intolerances to ensure comprehensive history available to assist in medical decision making as it pertains to the aforementioned surgical procedure and anticipated anesthetic course.   Clinical Discussion:  Leah Schaefer is a 80 y.o. female who is submitted for pre-surgical anesthesia review and clearance prior to her undergoing the above procedure. Patient has never been a smoker. Pertinent PMH includes: CVA, aortic atherosclerosis, HTN, HLD, T2DM, GERD (on daily PPI), OA, recurrent breast cancer (s/p chemoradiation; current AI therapy), OA, LEFT sided hemiparesis, anxiety.  Patient is followed by cardiology Nehemiah Massed, MD). She was last seen in the cardiology clinic on 10/12/2020; notes reviewed.  At the time of her clinic visit, patient was being seen to establish care with cardiology provider following a CVA that she suffered in 08/2020.  Patient doing well overall from a cardiovascular perspective she denied chest pain, PND, orthopnea, palpitations, peripheral edema, vertiginous symptoms, and presyncope/syncope.  She did however, complain of shortness of breath.  Patient with mild residual left-sided hemiparesis following CVA.  MRI imaging performed on 09/06/2020 reviewed; revealed a 1 cm acute infarct in the right parietal deep and  subcortical white matter.  Patient was started on long-term anticoagulation therapy (clopidogrel); compliant with therapy with no evidence of GI bleeding.  Last TTE performed on 11/07/2018 revealed normal left ventricular systolic function with an EF of 60 to 65%.  Given patient's complaints of shortness of breath and recent CVA, the decision was made to pursue further noninvasive cardiovascular testing including a TTE, myocardial perfusion imaging study, and long-term cardiac event monitor.  Cardiac event monitor study revealed a predominantly underlying sinus rhythm with a maximum heart rate of 138 bpm (average 86 bpm; with rare PVCs (see full dictation cardiovascular testing below).  TTE and myocardial fusion imaging study pending. Patient on GDMT for her HTN diagnosis.  Blood pressure well controlled at 110/80 on currently prescribed diuretic, ARB, and beta-blocker therapies.  Patient currently not on a statin; controlling HLD with omega-3 fatty acid and dietary modifications.. T2DM moderately controlled on currently prescribed regimen; Hgb A1c 7.6% when last checked on 09/07/2020. Functional capacity, as defined by DASI, is documented as being >/= 4 METS.  No changes were made to patient's medication regimen.  Patient to follow-up with outpatient cardiology once noninvasive studies have been completed.  Patient is scheduled to undergo a repeat urological procedure on 12/07/2020 with Dr. John Giovanni.  Given patient's past medical history significant for cardiovascular diagnoses, presurgical cardiac clearance was sought by the performing surgeon's office and PAT team. Per cardiology, "this patient is optimized for surgery and may proceed with the planned procedural course with a LOW risk stratification".  Of note, noninvasive cardiovascular testing is pending.  Per cardiology, patient may proceed with surgery prior to having the aforementioned testing performed.  Again, this patient is on daily antiplatelet  therapy. She has been instructed on recommendations for holding her clopidogrel for  7 days prior to her procedure with plans to restart as soon as postoperative bleeding risk felt to be minimized by her attending surgeon. The patient has been instructed that her last dose of her anticoagulant will be on 11/29/2020.  Patient denies previous perioperative complications with anesthesia in the past. In review of the available records, it is noted that patient underwent a general anesthetic course here (ASA III) in 11/2020 without documented complications.   Vitals with BMI 11/22/2020 11/12/2020 11/08/2020  Height _0  - -  Weight 197 lbs 190 lbs -  BMI 97.02 63.78 -  Systolic 588 502 774  Diastolic 75 63 85  Pulse 94 80 70    Providers/Specialists:   NOTE: Primary physician provider listed below. Patient may have been seen by APP or partner within same practice.   PROVIDER ROLE / SPECIALTY LAST Claud Kelp, MD  Urology (Surgeon)  11/22/2020  Jerrol Banana., MD  Primary Care Provider  11/12/2020  Serafina Royals, MD  Cardiology  10/12/2020   Allergies:  Patient has no known allergies.  Current Home Medications:   No current facility-administered medications for this encounter.   Marland Kitchen acetaminophen (TYLENOL) 500 MG tablet  . atorvastatin (LIPITOR) 40 MG tablet  . Calcium Carb-Cholecalciferol 600-800 MG-UNIT TABS  . chlorthalidone (HYGROTON) 25 MG tablet  . cholecalciferol (VITAMIN D) 1000 units tablet  . clopidogrel (PLAVIX) 75 MG tablet  . cyanocobalamin 500 MCG tablet  . gabapentin (NEURONTIN) 300 MG capsule  . latanoprost (XALATAN) 0.005 % ophthalmic solution  . letrozole (FEMARA) 2.5 MG tablet  . losartan (COZAAR) 50 MG tablet  . meclizine (ANTIVERT) 25 MG tablet  . metFORMIN (GLUCOPHAGE) 1000 MG tablet  . metoprolol tartrate (LOPRESSOR) 25 MG tablet  . Multiple Vitamin (MULTIVITAMIN WITH MINERALS) TABS tablet  . omega-3 acid ethyl esters (LOVAZA) 1 g capsule  .  pantoprazole (PROTONIX) 40 MG tablet  . pyridoxine (B-6) 100 MG tablet  . sertraline (ZOLOFT) 100 MG tablet  . vitamin C (ASCORBIC ACID) 500 MG tablet  . vitamin E 400 UNIT capsule  . sulfamethoxazole-trimethoprim (BACTRIM DS) 800-160 MG tablet   History:   Past Medical History:  Diagnosis Date  . Aortic atherosclerosis ([place])   . Arthritis   . Breast cancer ([place]) 2013   LEFT breast with chemo and rad tx  . Breast cancer ([place]) 2017   right breast with rad tx  . Breast cancer of upper-inner quadrant of right female breast ([place]) 09/21/2015   T1bN0 " 56m; ER100%, PR 90%, HER-2/neu not overexpressed.  . Cystitis   . Diabetes mellitus without complication ([facility])    NO MEDS-DIET CONTROLLED  . GERD (gastroesophageal reflux disease)   . Hernia 2011  . History of hiatal hernia   . History of kidney stones   . Hyperlipidemia   . Hypertension   . Malignant neoplasm of upper-inner quadrant of female breast ([place] 08/17/2011   Left, T2 (2.3 cm) N0, triple negative. Chemotherapy/post wide excision whole breast radiation.  . 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 Personal history of chemotherapy 2013   LEFT BREAST  . Personal history of malignant neoplasm of breast   . Personal history of radiation therapy 2017    Rt, 2013 had on left  . Stroke [place] 09/06/2020   1 cm acute infarction in the right parietal deep and subcortical white matter  . Vertigo    Past Surgical History:  Procedure Laterality Date  .  BREAST BIOPSY Left 07/18/2011   +  . BREAST BIOPSY Left 09/21/2015   neg  . BREAST BIOPSY Left 04/05/2016   neg  . BREAST BIOPSY Left 03/20/2019   [place] bx 2 areas /fat necrosis at 9:30 ribbon and 5:30 coil  . BREAST EXCISIONAL BIOPSY Right 08/2015   [place]  . BREAST LUMPECTOMY Right 09/30/2015   [facility] WITH MICROPAPILLARY FEATURES AND DCIS/CLEAR MARGINS, NEGATIVE LN  . BREAST LUMPECTOMY Left 08/17/2011  . BREAST LUMPECTOMY WITH SENTINEL LYMPH NODE  BIOPSY Right 09/30/2015   Procedure: BREAST LUMPECTOMY WITH SENTINEL LYMPH NODE BX;  Surgeon: Robert Bellow, MD;  Location: [facility] ORS;  Service: General;  Laterality: Right;  . BREAST SURGERY Left 2012   wide excision  . BREAST SURGERY Left November 2013   Core biopsy of the upper-outer quadrant showed fat necrosis.  . CHOLECYSTECTOMY  2007  . COLONOSCOPY  2011   Dr Candace Cruise  . COLONOSCOPY WITH PROPOFOL N/A 02/25/2015   Procedure: COLONOSCOPY WITH PROPOFOL;  Surgeon: Hulen Luster, MD;  Location: [facility] ENDOSCOPY;  Service: Gastroenterology;  Laterality: N/A;  . CYSTOSCOPY WITH STENT PLACEMENT Left 11/05/2020   Procedure: CYSTOSCOPY WITH STENT PLACEMENT;  Surgeon: Abbie Sons, MD;  Location: [facility] ORS;  Service: Urology;  Laterality: Left;  . DILATION AND CURETTAGE OF UTERUS  2004  . HERNIA REPAIR Right 40/05/2724   Umbilical/ventral hernia repaired with 6.4 cm Proceed ventral patch  . PORT-A-CATH REMOVAL    . PORTACATH PLACEMENT  2013  . 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  Marland Kitchen UPPER GI ENDOSCOPY  2011   Family History  Problem Relation Age of Onset  . Lymphoma Father   . Ovarian cancer Sister   . Colon cancer Brother   . Lymphoma Brother   . Cancer Other        stomach  . Cancer Other        stomach  . Breast cancer Neg Hx    Social History   Tobacco Use  . Smoking status: Never Smoker  . Smokeless tobacco: Never Used  Vaping Use  . Vaping Use: Never used  Substance Use Topics  . Alcohol use: No  . Drug use: No    Pertinent Clinical Results:  LABS: Labs reviewed: Acceptable for surgery.  Lab Results  Component Value Date   WBC 11.1 (H) 11/12/2020   HGB 12.8 11/12/2020   HCT 37.3 11/12/2020   MCV 87 11/12/2020   PLT 356 11/12/2020   Lab Results  Component Value Date   NA 140 11/12/2020   K 4.0 11/12/2020   CO2 22 11/12/2020   BUN 11 11/12/2020    CREATININE 1.00 11/12/2020   CALCIUM 10.0 11/12/2020   GLUCOSE 102 (H) 11/12/2020   Lab Results  Component Value Date   HGBA1C 7.6 (H) 09/07/2020    ECG: Date: 11/05/2020 Time ECG obtained: 1831 PM Rate: 92 bpm Rhythm: Sinus rhythm Axis (leads I and aVF): Normal Intervals: PR 172 ms. QRS 82 ms. QTc 489 ms. ST segment and T wave changes: Nonspecific ST and T wave abnormalities; no STEMI criteria  Comparison: Similar to previous tracing obtained on 09/13/2020   IMAGING / PROCEDURES: CT ABDOMEN PELVIS WITH CONTRAST performed on 11/05/2020 1. Obstructing 10 mm stone at the left UPJ with moderate left-sided hydronephrosis and delayed nephrogram 2. 2 additional nonobstructing stones at the lower pole of the left kidney 3.  Hepatic steatosis 4. Cholecystectomy and appendectomy 5. Multilevel degenerative changes in the lumbar spine 6. Aortic atherosclerosis  CT HEAD WITHOUT CONTRAST performed on 11/05/2020 1. No CT evidence for acute intracranial abnormality 2. Interval chronic infarct in the right frontal parietal subcortical white matter 3. Atrophy and chronic small vessel ischemic changes of the white matter  LONG TERM CARDIAC EVENT MONITOR STUDY performed on 10/27/2020 1. Predominantly underlying normal sinus rhythm with a maximum heart rate of 138 bpm 2. Minimum heart rate 71 bpm 3. Average heart rate 86 bpm 4. Rare PVCs 5. Heart rate was variable depending on activity levels with SVT occurring rarely at 8 beats and rare ventricular triplet. 6. There was no evidence of atrial fibrillation or other significant rhythm disturbances.  CTA HEAD AND NECK WITH OR WITHOUT CONTRAST performed on 09/06/2020 1. Stable size and appearance of all vein acute ischemic right parietal lobe infarct. No associated hemorrhage or mass effect. 2. No other new acute intracranial abnormality. 3. Underlying atrophy with moderate chronic microvascular ischemic disease. 4. Negative CTA for large  vessel occlusion. 5. Mild atheromatous change about the carotid bifurcations and carotid siphons without hemodynamically significant stenosis. 6. Azygos ACA  MRI BRAIN WITHOUT CONTRAST performed on 09/06/2020 1. 1 cm acute infarction in the right parietal deep and subcortical white matter.  Minimal overlapping cortical involvement.  No hemorrhage or mass-effect. 2. Moderate chronic small vessel ischemic changes elsewhere throughout the brain.  ECHOCARDIOGRAM performed on 11/07/2018 1. Left ventricle has normal systolic function with an ejection fraction of 60 to 65% 2. The left ventricular cavity size was normal 3. There is mild concentric left ventricular hypertrophy 4. Left ventricular diastolic Doppler parameters consistent with impaired relaxation 5. Right ventricle has normal systolic function 6. The right ventricular cavity size was normal 7. There is no increase in right ventricular wall thickness 8. Left atrial size is mildly dilated 9. The aortic valve was not well visualized  BILATERAL CAROTID DOPPLER performed on 11/07/2018 1. Duplex indicates minimal heterogeneous and calcified plaque with no hemodynamically significant stenosis by duplex criteria in the extracranial cerebrovascular circulation 2. Vertebrals demonstrate antegrade flow with low resistance waveform  Impression and Plan:  Leah Schaefer has been referred for pre-anesthesia review and clearance prior to her undergoing the planned anesthetic and procedural courses. Available labs, pertinent testing, and imaging results were personally reviewed by me. This patient has been appropriately cleared by cardiology with an overall LOW risk of significant perioperative cardiovascular complications.  Based on clinical review performed today (11/30/20), barring any significant acute changes in the patient's overall condition, it is anticipated that she will be able to proceed with the planned surgical intervention. Any acute  changes in clinical condition may necessitate her procedure being postponed and/or cancelled. Patient will meet with anesthesia team (MD and/or CRNA) on the day of her procedure for preoperative evaluation/assessment. Questions regarding anesthetic course will be fielded at that time.   Pre-surgical instructions were reviewed with the patient during her PAT appointment and questions were fielded by PAT clinical staff. Patient was advised that if any questions or concerns arise prior to her procedure then she should return a call to PAT and/or her surgeon's office to discuss.  Honor Loh, MSN, APRN, FNP-C, CEN [facility]  Peri-operative Services Nurse Practitioner Phone: [phone] 11/30/20 9:07 AM  NOTE: This note has been prepared using Dragon dictation software. Despite my best ability to proofread, there is always the potential that unintentional transcriptional errors may still  occur from this process.

## 2020-11-30 NOTE — Telephone Encounter (Signed)
Requested Prescriptions  Pending Prescriptions Disp Refills  . losartan (COZAAR) 50 MG tablet [Pharmacy Med Name: LOSARTAN POTASSIUM 50 MG Tablet] 90 tablet 0    Sig: TAKE 1 TABLET (50 MG TOTAL) BY MOUTH DAILY.     Cardiovascular:  Angiotensin Receptor Blockers Passed - 11/30/2020  6:53 PM      Passed - Cr in normal range and within 180 days    Creatinine  Date Value Ref Range Status  09/04/2014 0.92 0.60 - 1.30 mg/dL Final   Creatinine, Ser  Date Value Ref Range Status  11/12/2020 1.00 0.57 - 1.00 mg/dL Final         Passed - K in normal range and within 180 days    Potassium  Date Value Ref Range Status  11/12/2020 4.0 3.5 - 5.2 mmol/L Final  12/23/2012 4.7 3.5 - 5.1 mmol/L Final         Passed - Patient is not pregnant      Passed - Last BP in normal range    BP Readings from Last 1 Encounters:  11/22/20 120/75         Passed - Valid encounter within last 6 months    Recent Outpatient Visits          2 weeks ago Urinary tract infection without hematuria, site unspecified   [place], Kelby Aline, FNP   2 months ago Cerebrovascular accident (CVA), unspecified mechanism ([place])   [facility] Jerrol Banana., MD   4 months ago Controlled type 2 diabetes mellitus without complication, without long-term current use of insulin [facility])   [facility] Jerrol Banana., MD   5 months ago Urinary tract infection without hematuria, site unspecified   [facility] [place], Kelby Aline, FNP   6 months ago [place] Jerrol Banana., MD      Future Appointments            In 1 week Jerrol Banana., MD [facility]

## 2020-12-07 ENCOUNTER — Encounter: Payer: Self-pay | Admitting: Urology

## 2020-12-07 ENCOUNTER — Other Ambulatory Visit: Payer: Self-pay

## 2020-12-07 ENCOUNTER — Ambulatory Visit: Payer: Medicare HMO | Admitting: Urgent Care

## 2020-12-07 ENCOUNTER — Ambulatory Visit
Admission: RE | Admit: 2020-12-07 | Discharge: 2020-12-07 | Disposition: A | Discharge status: Home / Self Care | Payer: Medicare HMO | Attending: Urology | Admitting: Urology

## 2020-12-07 ENCOUNTER — Encounter
Admission: RE | Disposition: A | Discharge status: Home / Self Care | Payer: Self-pay | Source: Home / Self Care | Attending: Urology

## 2020-12-07 ENCOUNTER — Ambulatory Visit: Payer: Medicare HMO

## 2020-12-07 DIAGNOSIS — Z7984 Long term (current) use of oral hypoglycemic drugs: Secondary | ICD-10-CM | POA: Insufficient documentation

## 2020-12-07 DIAGNOSIS — Z79899 Other long term (current) drug therapy: Secondary | ICD-10-CM | POA: Insufficient documentation

## 2020-12-07 DIAGNOSIS — M1990 Unspecified osteoarthritis, unspecified site: Secondary | ICD-10-CM | POA: Diagnosis not present

## 2020-12-07 DIAGNOSIS — I69354 Hemiplegia and hemiparesis following cerebral infarction affecting left non-dominant side: Secondary | ICD-10-CM | POA: Insufficient documentation

## 2020-12-07 DIAGNOSIS — Z79811 Long term (current) use of aromatase inhibitors: Secondary | ICD-10-CM | POA: Diagnosis not present

## 2020-12-07 DIAGNOSIS — Z853 Personal history of malignant neoplasm of breast: Secondary | ICD-10-CM | POA: Insufficient documentation

## 2020-12-07 DIAGNOSIS — K219 Gastro-esophageal reflux disease without esophagitis: Secondary | ICD-10-CM | POA: Diagnosis not present

## 2020-12-07 DIAGNOSIS — Z9221 Personal history of antineoplastic chemotherapy: Secondary | ICD-10-CM | POA: Insufficient documentation

## 2020-12-07 DIAGNOSIS — Z9049 Acquired absence of other specified parts of digestive tract: Secondary | ICD-10-CM | POA: Insufficient documentation

## 2020-12-07 DIAGNOSIS — I119 Hypertensive heart disease without heart failure: Secondary | ICD-10-CM | POA: Diagnosis not present

## 2020-12-07 DIAGNOSIS — C50211 Malignant neoplasm of upper-inner quadrant of right female breast: Secondary | ICD-10-CM | POA: Insufficient documentation

## 2020-12-07 DIAGNOSIS — I700 Atherosclerosis of aorta: Secondary | ICD-10-CM | POA: Diagnosis not present

## 2020-12-07 DIAGNOSIS — N201 Calculus of ureter: Secondary | ICD-10-CM

## 2020-12-07 DIAGNOSIS — Z923 Personal history of irradiation: Secondary | ICD-10-CM | POA: Insufficient documentation

## 2020-12-07 DIAGNOSIS — Z170 Estrogen receptor positive status [ER+]: Secondary | ICD-10-CM | POA: Diagnosis not present

## 2020-12-07 DIAGNOSIS — E785 Hyperlipidemia, unspecified: Secondary | ICD-10-CM | POA: Insufficient documentation

## 2020-12-07 DIAGNOSIS — E119 Type 2 diabetes mellitus without complications: Secondary | ICD-10-CM | POA: Diagnosis not present

## 2020-12-07 DIAGNOSIS — N200 Calculus of kidney: Secondary | ICD-10-CM | POA: Diagnosis not present

## 2020-12-07 HISTORY — DX: Atherosclerosis of aorta: I70.0

## 2020-12-07 HISTORY — DX: Hemiplegia and hemiparesis following cerebral infarction affecting left non-dominant side: I69.354

## 2020-12-07 HISTORY — PX: CYSTOSCOPY/URETEROSCOPY/HOLMIUM LASER/STENT PLACEMENT: SHX6546

## 2020-12-07 HISTORY — DX: Long term (current) use of anticoagulants: Z79.01

## 2020-12-07 HISTORY — DX: Anxiety disorder, unspecified: F41.9

## 2020-12-07 LAB — GLUCOSE, CAPILLARY
Glucose-Capillary: 214 mg/dL — ABNORMAL HIGH (ref 70–99)
Glucose-Capillary: 203 mg/dL — ABNORMAL HIGH (ref 70–99)

## 2020-12-07 SURGERY — CYSTOSCOPY/URETEROSCOPY/HOLMIUM LASER/STENT PLACEMENT
Anesthesia: General | Laterality: Left

## 2020-12-07 MED ORDER — IOHEXOL 180 MG/ML  SOLN
INTRAMUSCULAR | Status: DC | PRN
  Administered 2020-12-07: 20 mL

## 2020-12-07 MED ORDER — CEFAZOLIN SODIUM-DEXTROSE 2-4 GM/100ML-% IV SOLN
INTRAVENOUS | Status: AC
  Filled 2020-12-07: qty 100

## 2020-12-07 MED ORDER — FENTANYL CITRATE (PF) 100 MCG/2ML IJ SOLN
INTRAMUSCULAR | Status: AC
  Filled 2020-12-07: qty 2

## 2020-12-07 MED ORDER — PHENYLEPHRINE HCL (PRESSORS) 10 MG/ML IV SOLN
INTRAVENOUS | Status: AC
  Filled 2020-12-07: qty 1

## 2020-12-07 MED ORDER — FENTANYL CITRATE (PF) 100 MCG/2ML IJ SOLN: 25.0000 ug | INTRAMUSCULAR | Status: DC | PRN

## 2020-12-07 MED ORDER — CEFAZOLIN SODIUM-DEXTROSE 2-4 GM/100ML-% IV SOLN
2.0000 g | INTRAVENOUS | Status: AC
  Administered 2020-12-07: 2 g via INTRAVENOUS

## 2020-12-07 MED ORDER — CHLORHEXIDINE GLUCONATE 0.12 % MT SOLN: 15.0000 mL | Freq: Once | OROMUCOSAL | Status: AC

## 2020-12-07 MED ORDER — ORAL CARE MOUTH RINSE: 15.0000 mL | Freq: Once | OROMUCOSAL | Status: AC

## 2020-12-07 MED ORDER — LIDOCAINE HCL (PF) 2 % IJ SOLN
INTRAMUSCULAR | Status: AC
  Filled 2020-12-07: qty 5

## 2020-12-07 MED ORDER — ONDANSETRON HCL 4 MG/2ML IJ SOLN
INTRAMUSCULAR | Status: AC
  Filled 2020-12-07: qty 2

## 2020-12-07 MED ORDER — LIDOCAINE HCL (CARDIAC) PF 100 MG/5ML IV SOSY
PREFILLED_SYRINGE | INTRAVENOUS | Status: DC | PRN
  Administered 2020-12-07: 100 mg via INTRAVENOUS

## 2020-12-07 MED ORDER — PROPOFOL 10 MG/ML IV BOLUS
INTRAVENOUS | Status: DC | PRN
  Administered 2020-12-07: 160 mg via INTRAVENOUS

## 2020-12-07 MED ORDER — PHENYLEPHRINE HCL (PRESSORS) 10 MG/ML IV SOLN
INTRAVENOUS | Status: DC | PRN
  Administered 2020-12-07 (×5): 100 ug via INTRAVENOUS

## 2020-12-07 MED ORDER — ONDANSETRON HCL 4 MG/2ML IJ SOLN
INTRAMUSCULAR | Status: DC | PRN
  Administered 2020-12-07: 4 mg via INTRAVENOUS

## 2020-12-07 MED ORDER — PROPOFOL 10 MG/ML IV BOLUS
INTRAVENOUS | Status: AC
  Filled 2020-12-07: qty 20

## 2020-12-07 MED ORDER — ONDANSETRON HCL 4 MG/2ML IJ SOLN: 4.0000 mg | Freq: Once | INTRAMUSCULAR | Status: DC | PRN

## 2020-12-07 MED ORDER — SODIUM CHLORIDE 0.9 % IV SOLN: INTRAVENOUS | Status: DC

## 2020-12-07 MED ORDER — CHLORHEXIDINE GLUCONATE 0.12 % MT SOLN
OROMUCOSAL | Status: AC
  Administered 2020-12-07: 15 mL via OROMUCOSAL
  Filled 2020-12-07: qty 15

## 2020-12-07 MED ORDER — LACTATED RINGERS IV SOLN: INTRAVENOUS | Status: DC

## 2020-12-07 SURGICAL SUPPLY — 29 items
BAG DRAIN CYSTO-URO LG1000N (MISCELLANEOUS) ×2 IMPLANT
BASKET ZERO TIP 1.9FR (BASKET) ×2 IMPLANT
BRUSH SCRUB EZ 1% IODOPHOR (MISCELLANEOUS) ×2 IMPLANT
BSKT STON RTRVL ZERO TP 1.9FR (BASKET) ×1
CATH URET FLEX-TIP 2 LUMEN 10F (CATHETERS) IMPLANT
CATH URETL 5X70 OPEN END (CATHETERS) IMPLANT
CNTNR SPEC 2.5X3XGRAD LEK (MISCELLANEOUS) ×1
CONT SPEC 4OZ STER OR WHT (MISCELLANEOUS) ×1
CONT SPEC 4OZ STRL OR WHT (MISCELLANEOUS) ×1
CONTAINER SPEC 2.5X3XGRAD LEK (MISCELLANEOUS) ×1 IMPLANT
DRAPE UTILITY 15X26 TOWEL STRL (DRAPES) ×2 IMPLANT
GLOVE SURG UNDER POLY LF SZ7.5 (GLOVE) ×2 IMPLANT
GOWN STRL REUS W/ TWL LRG LVL3 (GOWN DISPOSABLE) ×1 IMPLANT
GOWN STRL REUS W/ TWL XL LVL3 (GOWN DISPOSABLE) ×1 IMPLANT
GOWN STRL REUS W/TWL LRG LVL3 (GOWN DISPOSABLE) ×2
GOWN STRL REUS W/TWL XL LVL3 (GOWN DISPOSABLE) ×2
GUIDEWIRE STR DUAL SENSOR (WIRE) ×2 IMPLANT
INFUSOR MANOMETER BAG 3000ML (MISCELLANEOUS) ×2 IMPLANT
IV NS IRRIG 3000ML ARTHROMATIC (IV SOLUTION) ×2 IMPLANT
KIT TURNOVER CYSTO (KITS) ×2 IMPLANT
PACK CYSTO AR (MISCELLANEOUS) ×2 IMPLANT
SET CYSTO W/LG BORE CLAMP LF (SET/KITS/TRAYS/PACK) ×2 IMPLANT
SHEATH URETERAL 12FRX35CM (MISCELLANEOUS) IMPLANT
STENT URET 6FRX24 CONTOUR (STENTS) ×2 IMPLANT
STENT URET 6FRX26 CONTOUR (STENTS) IMPLANT
SURGILUBE 2OZ TUBE FLIPTOP (MISCELLANEOUS) ×2 IMPLANT
TRACTIP FLEXIVA PULSE ID 200 (Laser) ×2 IMPLANT
VALVE UROSEAL ADJ ENDO (VALVE) ×2 IMPLANT
WATER STERILE IRR 1000ML POUR (IV SOLUTION) ×2 IMPLANT

## 2020-12-07 NOTE — Op Note (Signed)
Preoperative diagnosis:  1.  Left proximal ureteral calculus  Postoperative diagnosis:  1.  Left nephrolithiasis  Procedure:  1. Cystoscopy 2. Left ureteroscopy and stone removal 3. Ureteroscopic laser lithotripsy 4. Left ureteral stent exchange (6FR/24 cm 5. Left retrograde pyelography with interpretation  Surgeon: Nicki Reaper C. Rhylen Shaheen, M.D.  Anesthesia: General  Complications: None  Intraoperative findings:  1. Cystoscopy-bladder mucosa without erythema, solid or papillary lesions with the exception of inflammatory changes left hemitrigone secondary to her indwelling stent 2. Ureteroscopy-normal appearing distal, mid, proximal ureter without calculi.  The 10 mm calculus was within a lower pole calyx; 5 calyceal calculi measuring less than 1 mm 3. Retrograde pyelography post procedure showed no filling defect or contrast extravasation   EBL: Minimal  Specimens: 1. Calculus fragments for analysis   Indication: Leah Schaefer is a 80 y.o. as/P left ureteral stent placement 11/08/2020 for a 10 mm left proximal ureteral calculus with obstruction and sepsis.  She presents today for definitive stone treatment.  After reviewing the management options for treatment, the patient elected to proceed with the above surgical procedure(s). We have discussed the potential benefits and risks of the procedure, side effects of the proposed treatment, the likelihood of the patient achieving the goals of the procedure, and any potential problems that might occur during the procedure or recuperation. Informed consent has been obtained.  Description of procedure:  The patient was taken to the operating room and general anesthesia was induced.  The patient was placed in the dorsal lithotomy position, prepped and draped in the usual sterile fashion, and preoperative antibiotics were administered. A preoperative time-out was performed.   A 21 French cystoscope was lubricated and passed per urethra.   Panendoscopy was performed with findings as described above.  The indwelling stent was grasped with endoscopic forceps and brought out through the urethral meatus.  A 0.038 Sensor wire was then placed through the stent and advanced to the renal pelvis.  The stent was removed.  A 4.5 French semirigid ureteroscope was passed per urethra.  The left ureteral orifice was easily engaged and the semirigid scope was passed to the UPJ and no calculus was identified.  The semirigid ureteroscope was removed and a single channel digital ureteroscope was passed per urethra.  The ureteroscope was easily advanced into the left ureter alongside the guidewire and advanced to the renal pelvis.  Ureteropyeloscopy was performed with findings as described above.  The 10 mm calculus was then dusted with a 242 m holmium laser fiber at settings of 0.2 J / 40 Hz.  The stone was quite soft and fragmented more than dusted.  2 fragments were placed in a 1.9 French nitinol basket and removed and sent for analysis.  The ureteroscope was replaced and the remaining fragments were treated with noncontact laser lithotripsy at a setting of 0.5 J / 40 Hz.  The smaller calculi were also treated in a similar fashion.  Retrograde pyelogram was then performed with findings as described above.  All calyces were examined under direct vision and no significantly sized fragments were identified.  The ureteroscope was removed under direct vision and no fragments were identified in the ureter.  A 6FR/24 cm Contour ureteral stent was then placed over the guidewire under fluoroscopic guidance.  The proximal stent tip was hooked in the lower pole calyx and the distal and the stent was well positioned in the bladder.  After anesthetic reversal patient was transported to the PACU in stable condition.  Plan:  Her  stent was left attached to a tether and she was instructed to remove on Thursday, 12/09/2020  Postop follow-up approximately 1  month    John Giovanni, MD

## 2020-12-07 NOTE — Interval H&P Note (Signed)
History and Physical Interval Note:  12/07/2020 7:24 AM  Leah Schaefer  has presented today for surgery, with the diagnosis of Left ureteral calculus.  The various methods of treatment have been discussed with the patient and family. After consideration of risks, benefits and other options for treatment, the patient has consented to  Procedure(s): CYSTOSCOPY/URETEROSCOPY/HOLMIUM LASER/STENT PLACEMENT (Left) as a surgical intervention.  The patient's history has been reviewed, patient examined, no change in status, stable for surgery.  I have reviewed the patient's chart and labs.  Questions were answered to the patient's satisfaction.     [place]

## 2020-12-07 NOTE — Discharge Instructions (Addendum)
AMBULATORY SURGERY  DISCHARGE INSTRUCTIONS   1) The drugs that you were given will stay in your system until tomorrow so for the next 24 hours you should not:  A) Drive an automobile B) Make any legal decisions C) Drink any alcoholic beverage   2) You may resume regular meals tomorrow.  Today it is better to start with liquids and gradually work up to solid foods.  You may eat anything you prefer, but it is better to start with liquids, then soup and crackers, and gradually work up to solid foods.   3) Please notify your doctor immediately if you have any unusual bleeding, trouble breathing, redness and pain at the surgery site, drainage, fever, or pain not relieved by medication.    4) Additional Instructions:   Please contact your physician with any problems or Same Day Surgery at [phone], Monday through Friday 6 am to 4 pm, or [place] at [facility] number at [phone].  DISCHARGE INSTRUCTIONS FOR KIDNEY STONE/URETERAL STENT   MEDICATIONS:  1. Resume all your other meds from home.  2.  AZO (over-the-counter) can help with the burning/stinging when you urinate.   ACTIVITY:  1. May resume regular activities in 24 hours. 2. No driving while on narcotic pain medications  3. Drink plenty of water  4. Continue to walk at home - you can still get blood clots when you are at home, so keep active, but don't over do it.  5. May return to work/school tomorrow or when you feel ready   BATHING:  1. You can shower. 2. You have a string coming from your urethra: The stent string is attached to your ureteral stent. Do not pull on this.   SIGNS/SYMPTOMS TO CALL:  Please call us if you have a fever greater than 101.5, uncontrolled nausea/vomiting, uncontrolled pain, dizziness, unable to urinate, excessively bloody urine, chest pain, shortness of breath, leg swelling, leg pain, or any other concerns or questions.   Common postoperative symptoms include urinary frequency,  urgency, burning and bladder spasm.  Blood in the urine is normal.  You can reach [place] at [phone].   FOLLOW-UP:  1. You we will have a follow-up appointment in approximately 1 month.  If you do not have an appointment the time of discharge our office will contact you with a date 2. You have a string attached to your stent, you may remove it on Thursday, 12/09/2020 to do this, pull the string until the stent is completely removed. You may feel an odd sensation in your back.  If you would prefer to have one of our office staff remove the stent then please contact her office and a time will be arranged.

## 2020-12-07 NOTE — Anesthesia Preprocedure Evaluation (Signed)
Anesthesia Evaluation  Patient identified by MRN, date of birth, ID band Patient awake    Reviewed: Allergy & Precautions, NPO status , Patient's Chart, lab work & pertinent test results  History of Anesthesia Complications Negative for: history of anesthetic complications  Airway Mallampati: II       Dental   Pulmonary neg sleep apnea, neg COPD, Not current smoker,           Cardiovascular hypertension, Pt. on medications (-) Past MI and (-) CHF (-) dysrhythmias (-) Valvular Problems/Murmurs     Neuro/Psych neg Seizures Anxiety Depression CVA (L Leg weakness), Residual Symptoms    GI/Hepatic Neg liver ROS, hiatal hernia, GERD  Medicated and Controlled,  Endo/Other  diabetes, Type 2, Oral Hypoglycemic Agents  Renal/GU Renal InsufficiencyRenal disease (stones)     Musculoskeletal   Abdominal   Peds  Hematology   Anesthesia Other Findings   Reproductive/Obstetrics                             Anesthesia Physical Anesthesia Plan  ASA: III  Anesthesia Plan: General   Post-op Pain Management:    Induction: Intravenous  PONV Risk Score and Plan: 3 and Ondansetron, Dexamethasone and Treatment may vary due to age or medical condition  Airway Management Planned: LMA  Additional Equipment:   Intra-op Plan:   Post-operative Plan:   Informed Consent: I have reviewed the patients History and Physical, chart, labs and discussed the procedure including the risks, benefits and alternatives for the proposed anesthesia with the patient or authorized representative who has indicated his/her understanding and acceptance.       Plan Discussed with:   Anesthesia Plan Comments:         Anesthesia Quick Evaluation

## 2020-12-07 NOTE — Anesthesia Procedure Notes (Signed)
Procedure Name: LMA Insertion Date/Time: 12/07/2020 9:01 AM Performed by: Rona Ravens, CRNA Pre-anesthesia Checklist: Patient identified, Emergency Drugs available, Suction available, Patient being monitored and Timeout performed Patient Re-evaluated:Patient Re-evaluated prior to induction Oxygen Delivery Method: Circle system utilized Preoxygenation: Pre-oxygenation with 100% oxygen Induction Type: IV induction Ventilation: Mask ventilation without difficulty LMA: LMA inserted LMA Size: 4.0 Tube type: Oral Number of attempts: 1 Placement Confirmation: positive ETCO2 and breath sounds checked- equal and bilateral Tube secured with: Tape Dental Injury: Teeth and Oropharynx as per pre-operative assessment

## 2020-12-07 NOTE — Anesthesia Postprocedure Evaluation (Signed)
Anesthesia Post Note  Patient: Leah Schaefer  Procedure(s) Performed: CYSTOSCOPY/URETEROSCOPY/HOLMIUM LASER/STENT PLACEMENT (Left )  Patient location during evaluation: PACU Anesthesia Type: General Level of consciousness: awake and alert Pain management: pain level controlled Vital Signs Assessment: post-procedure vital signs reviewed and stable Respiratory status: spontaneous breathing and respiratory function stable Cardiovascular status: stable Anesthetic complications: no   No complications documented.   Last Vitals:  Vitals:   12/07/20 1029 12/07/20 1030  BP:  (!) 122/56  Pulse: 73 73  Resp: 16 12  Temp:  (!) 36.1 C  SpO2: 100% 97%    Last Pain:  Vitals:   12/07/20 1029  TempSrc:   PainSc: 0-No pain                 [place] Stra[place] K

## 2020-12-07 NOTE — Transfer of Care (Signed)
Immediate Anesthesia Transfer of Care Note  Patient: Leah Schaefer  Procedure(s) Performed: CYSTOSCOPY/URETEROSCOPY/HOLMIUM LASER/STENT PLACEMENT (Left )  Patient Location: PACU  Anesthesia Type:General  Level of Consciousness: awake  Airway & Oxygen Therapy: Patient Spontanous Breathing and Patient connected to face mask oxygen  Post-op Assessment: Report given to RN and Post -op Vital signs reviewed and stable  Post vital signs: Reviewed and stable  Last Vitals:  Vitals Value Taken Time  BP 134/80 12/07/20 1000  Temp    Pulse 69 12/07/20 1002  Resp 16 12/07/20 1002  SpO2 98 % 12/07/20 1002  Vitals shown include unvalidated device data.  Last Pain:  Vitals:   12/07/20 0713  TempSrc: Temporal  PainSc: 0-No pain      Patients Stated Pain Goal: 0 (05/39/76 7341)  Complications: No complications documented.

## 2020-12-08 ENCOUNTER — Other Ambulatory Visit: Payer: Medicare HMO

## 2020-12-08 ENCOUNTER — Telehealth: Payer: Self-pay | Admitting: Urology

## 2020-12-08 NOTE — Telephone Encounter (Signed)
Called to make her a 1 month post op app but the phone just rang. No voicemail Made app and mailed it to the patient.  Sharyn Lull

## 2020-12-10 ENCOUNTER — Ambulatory Visit (INDEPENDENT_AMBULATORY_CARE_PROVIDER_SITE_OTHER): Payer: Medicare HMO

## 2020-12-10 ENCOUNTER — Other Ambulatory Visit: Payer: Self-pay

## 2020-12-10 DIAGNOSIS — Z466 Encounter for fitting and adjustment of urinary device: Secondary | ICD-10-CM

## 2020-12-10 NOTE — Progress Notes (Signed)
Patient presents in clinic for removal of urethral stent as she was not able to remove on her own with tether. Patient placed in stirrups, stent was removed without difficulty. Patient instructed to push fluids and follow post op instructions previously given. Patient gave verbal understanding. Patient to RTC as scheduled.

## 2020-12-13 ENCOUNTER — Ambulatory Visit (INDEPENDENT_AMBULATORY_CARE_PROVIDER_SITE_OTHER): Payer: Medicare HMO | Admitting: Family Medicine

## 2020-12-13 ENCOUNTER — Encounter: Payer: Self-pay | Admitting: Family Medicine

## 2020-12-13 ENCOUNTER — Other Ambulatory Visit: Payer: Self-pay

## 2020-12-13 VITALS — BP 111/74 | HR 69 | Resp 16 | Ht 66.0 in | Wt 188.0 lb

## 2020-12-13 DIAGNOSIS — E7800 Pure hypercholesterolemia, unspecified: Secondary | ICD-10-CM | POA: Diagnosis not present

## 2020-12-13 DIAGNOSIS — I639 Cerebral infarction, unspecified: Secondary | ICD-10-CM | POA: Diagnosis not present

## 2020-12-13 DIAGNOSIS — E118 Type 2 diabetes mellitus with unspecified complications: Secondary | ICD-10-CM | POA: Diagnosis not present

## 2020-12-13 DIAGNOSIS — C50411 Malignant neoplasm of upper-outer quadrant of right female breast: Secondary | ICD-10-CM | POA: Diagnosis not present

## 2020-12-13 DIAGNOSIS — F330 Major depressive disorder, recurrent, mild: Secondary | ICD-10-CM | POA: Diagnosis not present

## 2020-12-13 DIAGNOSIS — E119 Type 2 diabetes mellitus without complications: Secondary | ICD-10-CM | POA: Diagnosis not present

## 2020-12-13 DIAGNOSIS — Z170 Estrogen receptor positive status [ER+]: Secondary | ICD-10-CM

## 2020-12-13 DIAGNOSIS — I10 Essential (primary) hypertension: Secondary | ICD-10-CM

## 2020-12-13 LAB — POCT GLYCOSYLATED HEMOGLOBIN (HGB A1C): Hemoglobin A1C: 7.5 % — AB (ref 4.0–5.6)

## 2020-12-13 LAB — CALCULI, WITH PHOTOGRAPH (CLINICAL LAB)
Carbonate Apatite: 100 %
Weight Calculi: 7 mg

## 2020-12-13 MED ORDER — BUPROPION HCL ER (XL) 150 MG PO TB24: 150.0000 mg | ORAL_TABLET | Freq: Every day | ORAL | 3 refills | Status: DC

## 2020-12-13 NOTE — Progress Notes (Signed)
Established patient visit   Patient: Leah Schaefer   DOB: [birth year]   80 y.o. Female  MRN: [id number] Visit Date: 12/13/2020  Today's healthcare provider: [name], MD   Chief Complaint  Patient presents with  . Depression  . ER follow up    Subjective    HPI  Patient comes in today for follow-up.  She has had some anhedonia since being diagnosed with a stroke 2 months ago.  Physically she feels fine but has been bothered by this. She is also had urinary tract infections and kidney stones and had urologic procedures. Depression, Follow-up  She  was last seen for this 3 months ago. Changes made at last visit include increase sertraline to 100mg  daily.   She reports good compliance with treatment. She is not having side effects.   She reports good tolerance of treatment. She feels she is Unchanged since last visit.  Depression screen [facility] 2/9 12/13/2020 07/07/2020 05/03/2020  Decreased Interest 1 1 0  Down, Depressed, Hopeless 1 1 0  PHQ - 2 Score 2 2 0  Altered sleeping 0 0 0  Tired, decreased energy 1 1 0  Change in appetite 0 0 0  Feeling bad or failure about yourself  0 0 0  Trouble concentrating 0 0 1  Moving slowly or fidgety/restless 0 0 0  Suicidal thoughts 0 0 0  PHQ-9 Score 3 3 1   Difficult doing work/chores Not difficult at all Not difficult at all Not difficult at all  Some recent data might be hidden    Follow up ER visit  Patient was seen in ER for kidney stones on 12/07/2020. She was treated for kidney stones. Treatment for this included cystoscopy by Dr. Bernardo Heater.  She reports good compliance with treatment. She reports this condition is Improved.        Medications: Outpatient Medications Prior to Visit  Medication Sig  . acetaminophen (TYLENOL) 500 MG tablet Take 500 mg by mouth every 6 (six) hours as needed for mild pain or headache.   Marland Kitchen atorvastatin (LIPITOR) 40 MG tablet TAKE 1 TABLET EVERY EVENING (Patient taking differently:  Take 40 mg by mouth every evening.)  . Calcium Carb-Cholecalciferol 600-800 MG-UNIT TABS Take 1 tablet by mouth daily.  . chlorthalidone (HYGROTON) 25 MG tablet Take 1 tablet (25 mg total) by mouth daily.  . cholecalciferol (VITAMIN D) 1000 units tablet Take 1,000 Units by mouth daily.  . clopidogrel (PLAVIX) 75 MG tablet TAKE 1 TABLET EVERY DAY (Patient taking differently: Take 75 mg by mouth daily.)  . cyanocobalamin 500 MCG tablet Take 500 mcg by mouth daily.  Marland Kitchen gabapentin (NEURONTIN) 300 MG capsule TAKE 2 CAPSULES AT BEDTIME (Patient taking differently: Take 600 mg by mouth at bedtime.)  . latanoprost (XALATAN) 0.005 % ophthalmic solution Place 1 drop into both eyes at bedtime.   Marland Kitchen letrozole (FEMARA) 2.5 MG tablet TAKE 1 TABLET (2.5 MG TOTAL) BY MOUTH DAILY.  Marland Kitchen losartan (COZAAR) 50 MG tablet TAKE 1 TABLET (50 MG TOTAL) BY MOUTH DAILY.  . meclizine (ANTIVERT) 25 MG tablet Take 0.5 tablets (12.5 mg total) by mouth 3 (three) times daily as needed for dizziness.  . metFORMIN (GLUCOPHAGE) 1000 MG tablet TAKE 1 TABLET TWICE DAILY  WITH  A  MEAL (Patient taking differently: Take 1,000 mg by mouth 2 (two) times daily with a meal.)  . metoprolol tartrate (LOPRESSOR) 25 MG tablet TAKE 1/2 TABLET (12.5 MG TOTAL) TWICE DAILY (Patient taking differently:  Take 12.5 mg by mouth 2 (two) times daily.)  . Multiple Vitamin (MULTIVITAMIN WITH MINERALS) TABS tablet Take 1 tablet by mouth daily.  Marland Kitchen omega-3 acid ethyl esters (LOVAZA) 1 g capsule Take 1 g by mouth daily.   . pantoprazole (PROTONIX) 40 MG tablet TAKE 1 TABLET TWICE DAILY (Patient taking differently: Take 40 mg by mouth daily.)  . pyridoxine (B-6) 100 MG tablet Take 100 mg by mouth daily.  . sertraline (ZOLOFT) 100 MG tablet Take 0.5 tablets (50 mg total) by mouth daily. Doctor increased dose to 100mg  05/2020  but patient reports that she never increased the dose. She continued taking 50mg  daily. (Patient taking differently: Take 100 mg by mouth daily.)   . sulfamethoxazole-trimethoprim (BACTRIM DS) 800-160 MG tablet Take 1 tablet by mouth every 12 (twelve) hours.  . vitamin C (ASCORBIC ACID) 500 MG tablet Take 500 mg by mouth daily.  . vitamin E 400 UNIT capsule Take 400 Units by mouth daily.   No facility-administered medications prior to visit.    Review of Systems  Constitutional: Negative for activity change and fatigue.  Respiratory: Negative for cough and shortness of breath.   Cardiovascular: Negative for chest pain, palpitations and leg swelling.  Endocrine: Negative for cold intolerance, heat intolerance, polydipsia, polyphagia and polyuria.  Neurological: Negative for dizziness and headaches.  Psychiatric/Behavioral: Negative for agitation, self-injury, sleep disturbance and suicidal ideas. The patient is not nervous/anxious.         Objective    BP 111/74   Pulse 69   Resp 16   Ht 5\' 6"  (1.676 m)   Wt 188 lb (85.3 kg)   BMI 30.34 kg/m  BP Readings from Last 3 Encounters:  12/13/20 111/74  12/07/20 135/71  11/22/20 120/75   Wt Readings from Last 3 Encounters:  12/13/20 188 lb (85.3 kg)  12/07/20 197 lb 1.5 oz (89.4 kg)  11/22/20 197 lb (89.4 kg)       Physical Exam Vitals reviewed.  Constitutional:      Appearance: She is well-developed. She is obese.  HENT:     Head: Normocephalic and atraumatic.     Right Ear: External ear normal.     Left Ear: External ear normal.     Nose: Nose normal.  Eyes:     General: No scleral icterus. Neck:     Thyroid: No thyromegaly.  Cardiovascular:     Rate and Rhythm: Normal rate and regular rhythm.     Heart sounds: Normal heart sounds.  Pulmonary:     Effort: Pulmonary effort is normal.     Breath sounds: Normal breath sounds.  Abdominal:     Palpations: Abdomen is soft.  Skin:    General: Skin is warm and dry.  Neurological:     General: No focal deficit present.     Mental Status: She is alert and oriented to person, place, and time.  Psychiatric:         Mood and Affect: Mood normal.        Behavior: Behavior normal.        Thought Content: Thought content normal.        Judgment: Judgment normal.       No results found for any visits on 12/13/20.  Assessment & Plan     1. Type 2 diabetes mellitus with complication, without long-term current use of insulin (HCC) Good control with A1c of 7.5. On metformin 2. Controlled type 2 diabetes mellitus without complication, without long-term current use  of insulin (HCC)  - POCT glycosylated hemoglobin (Hb A1C)  3. Mild episode of recurrent major depressive disorder (HCC) Add Wellbutrin 150   4. Cerebrovascular accident (CVA), unspecified mechanism ([place])   5. Carcinoma of upper-outer quadrant of right breast in female, estrogen receptor positive ([place]) Femara  6. Benign essential HTN Trolled on losartan Thalitone and metoprolol  7. Hypercholesteremia On atorvastatin   No follow-ups on file.      I, [name], MD, have reviewed all documentation for this visit. The documentation on 12/17/20 for the exam, diagnosis, procedures, and orders are all accurate and complete.    Corry Ihnen Cranford Mon, MD  [facility] [phone] (phone) [phone] (fax)  [place]

## 2020-12-17 DIAGNOSIS — E118 Type 2 diabetes mellitus with unspecified complications: Secondary | ICD-10-CM | POA: Insufficient documentation

## 2020-12-20 ENCOUNTER — Other Ambulatory Visit: Payer: Self-pay | Admitting: Family Medicine

## 2020-12-20 DIAGNOSIS — M545 Low back pain, unspecified: Secondary | ICD-10-CM

## 2020-12-20 NOTE — Telephone Encounter (Signed)
   Notes to clinic:  medication requested is not on current medication list  Review for continued use and refill    Requested Prescriptions  Pending Prescriptions Disp Refills   meloxicam (MOBIC) 15 MG tablet [Pharmacy Med Name: MELOXICAM 15 MG Tablet] 90 tablet 1    Sig: TAKE 1 TABLET EVERY DAY      Analgesics:  COX2 Inhibitors Passed - 12/20/2020  1:43 PM      Passed - HGB in normal range and within 360 days    Hemoglobin  Date Value Ref Range Status  11/12/2020 12.8 11.1 - 15.9 g/dL Final          Passed - Cr in normal range and within 360 days    Creatinine  Date Value Ref Range Status  09/04/2014 0.92 0.60 - 1.30 mg/dL Final   Creatinine, Ser  Date Value Ref Range Status  11/12/2020 1.00 0.57 - 1.00 mg/dL Final          Passed - Patient is not pregnant      Passed - Valid encounter within last 12 months    Recent Outpatient Visits           1 week ago Type 2 diabetes mellitus with complication, without long-term current use of insulin ([place])   [facility] Jerrol Banana., MD   1 month ago Urinary tract infection without hematuria, site unspecified   [facility] Flinchum, Kelby Aline, FNP   3 months ago Cerebrovascular accident (CVA), unspecified mechanism [facility])   [facility] Jerrol Banana., MD   5 months ago Controlled type 2 diabetes mellitus without complication, without long-term current use of insulin [facility])   [facility] Jerrol Banana., MD   6 months ago Urinary tract infection without hematuria, site unspecified   [facility] Flinchum, Kelby Aline, FNP       Future Appointments             In 3 months Jerrol Banana., MD [facility] [place]

## 2020-12-21 ENCOUNTER — Other Ambulatory Visit (HOSPITAL_COMMUNITY): Payer: Self-pay

## 2020-12-21 ENCOUNTER — Other Ambulatory Visit: Payer: Self-pay

## 2020-12-22 ENCOUNTER — Ambulatory Visit: Payer: Medicare HMO | Admitting: Internal Medicine

## 2020-12-22 ENCOUNTER — Other Ambulatory Visit: Payer: Medicare HMO

## 2020-12-28 ENCOUNTER — Telehealth: Payer: Self-pay

## 2020-12-28 NOTE — Progress Notes (Signed)
Chronic Care Management Pharmacy Assistant   Name: Leah Schaefer  MRN: [id number] DOB: [birth year]  Reason for Encounter: Medication Review/General Adherence Call.   Recent office visits:  12/13/2020 Dr Rosanna Randy MD (PCP) start bupropion 150 mg daily  11/12/2020 Laverna Peace FNP (PCP Office) Start cephALEXin [facility]) 500 MG capsule   Recent consult visits:  11/23/2020 Dr. Bernardo Heater MD (Urology) -  Start Septra8 00-160 mg  11/22/2020 Dr. Bernardo Heater MD (Urology)  10/27/2020 Dr. Nehemiah Massed MD (Cardiology)  10/12/2020 Dr. Nehemiah Massed MD (Cardiology)   Hospital visits:  Medication Reconciliation was completed by comparing discharge summary, patient's EMR and Pharmacy list, and upon discussion with patient.  Admitted to the hospital on 12/07/2020 due to Nephrolithiasis. Discharge date was 12/07/2020. Discharged from [place]?Medications Started at [facility] Discharge:?? -started none  Medication Changes at Hospital Discharge: -Changed none  Medications Discontinued at Hospital Discharge: -Stopped   Medications that remain the same after Hospital Discharge:??  -All other medications will remain the same.    Admitted to the hospital on 11/05/2020 due to Sepsis. Discharge date was 11/08/2020. Discharged from [place]?Medications Started at [facility] Discharge:?? -started None   Medication Changes at Hospital Discharge: -Changed Sertraline -Take 0.5 tablets (50 mg total) by mouth daily. Doctor increased dose to 100mg  05/2020  but patient reports that she never increased the dose. She continued taking 50mg  daily  Medications Discontinued at Hospital Discharge: -Stopped Aspirin 81 mg  Medications that remain the same after Hospital Discharge:??  -All other medications will remain the same.    Medications: Outpatient Encounter Medications as of 12/28/2020  Medication Sig  . acetaminophen (TYLENOL) 500 MG tablet Take 500 mg by mouth  every 6 (six) hours as needed for mild pain or headache.   Marland Kitchen atorvastatin (LIPITOR) 40 MG tablet TAKE 1 TABLET EVERY EVENING (Patient taking differently: Take 40 mg by mouth every evening.)  . buPROPion (WELLBUTRIN XL) 150 MG 24 hr tablet Take 1 tablet (150 mg total) by mouth daily.  . Calcium Carb-Cholecalciferol 600-800 MG-UNIT TABS Take 1 tablet by mouth daily.  . chlorthalidone (HYGROTON) 25 MG tablet Take 1 tablet (25 mg total) by mouth daily.  . cholecalciferol (VITAMIN D) 1000 units tablet Take 1,000 Units by mouth daily.  . clopidogrel (PLAVIX) 75 MG tablet TAKE 1 TABLET EVERY DAY (Patient taking differently: Take 75 mg by mouth daily.)  . cyanocobalamin 500 MCG tablet Take 500 mcg by mouth daily.  Marland Kitchen gabapentin (NEURONTIN) 300 MG capsule TAKE 2 CAPSULES AT BEDTIME (Patient taking differently: Take 600 mg by mouth at bedtime.)  . latanoprost (XALATAN) 0.005 % ophthalmic solution Place 1 drop into both eyes at bedtime.   Marland Kitchen letrozole (FEMARA) 2.5 MG tablet TAKE 1 TABLET (2.5 MG TOTAL) BY MOUTH DAILY.  Marland Kitchen losartan (COZAAR) 50 MG tablet TAKE 1 TABLET (50 MG TOTAL) BY MOUTH DAILY.  . meclizine (ANTIVERT) 25 MG tablet Take 0.5 tablets (12.5 mg total) by mouth 3 (three) times daily as needed for dizziness.  . meloxicam (MOBIC) 15 MG tablet TAKE 1 TABLET EVERY DAY  . metFORMIN (GLUCOPHAGE) 1000 MG tablet TAKE 1 TABLET TWICE DAILY  WITH  A  MEAL (Patient taking differently: Take 1,000 mg by mouth 2 (two) times daily with a meal.)  . metoprolol tartrate (LOPRESSOR) 25 MG tablet TAKE 1/2 TABLET (12.5 MG TOTAL) TWICE DAILY (Patient taking differently: Take 12.5 mg by mouth 2 (two) times daily.)  . Multiple Vitamin (MULTIVITAMIN WITH  MINERALS) TABS tablet Take 1 tablet by mouth daily.  Marland Kitchen omega-3 acid ethyl esters (LOVAZA) 1 g capsule Take 1 g by mouth daily.   . pantoprazole (PROTONIX) 40 MG tablet TAKE 1 TABLET TWICE DAILY (Patient taking differently: Take 40 mg by mouth daily.)  . pyridoxine (B-6) 100  MG tablet Take 100 mg by mouth daily.  . sertraline (ZOLOFT) 100 MG tablet Take 0.5 tablets (50 mg total) by mouth daily. Doctor increased dose to 100mg  05/2020  but patient reports that she never increased the dose. She continued taking 50mg  daily. (Patient taking differently: Take 100 mg by mouth daily.)  . sulfamethoxazole-trimethoprim (BACTRIM DS) 800-160 MG tablet Take 1 tablet by mouth every 12 (twelve) hours.  . vitamin C (ASCORBIC ACID) 500 MG tablet Take 500 mg by mouth daily.  . vitamin E 400 UNIT capsule Take 400 Units by mouth daily.   No facility-administered encounter medications on file as of 12/28/2020.   Star Rating Drugs: Atorvastatin 40 mg last filled on 10/19/2020 for 90 day supply at [facility]. Losartan 50 mg last filled on 12/05/2020 for 90 day supply at [facility]. Metformin 1000 mg last filled on 11/12/2020 for 90 day supply at [facility].   Called patient and discussed medication adherence  with patient, no issues at this time with current medication.   Patient denies ED visit since her last CPP follow up.  Patient denies any side effects with her medication. Patient denies any problems with her current pharmacy  Schedule follow up telephone visit with Clinical pharmacist on 02/21/2021 at 3:30 pm. Sent Message to scheduler.  [place] Pharmacist Assistant [phone]

## 2020-12-30 ENCOUNTER — Ambulatory Visit
Admission: RE | Admit: 2020-12-30 | Discharge: 2020-12-30 | Disposition: A | Discharge status: Home / Self Care | Payer: Medicare HMO | Source: Ambulatory Visit | Attending: Internal Medicine | Admitting: Internal Medicine

## 2020-12-30 ENCOUNTER — Other Ambulatory Visit: Payer: Self-pay

## 2020-12-30 DIAGNOSIS — Z923 Personal history of irradiation: Secondary | ICD-10-CM | POA: Diagnosis not present

## 2020-12-30 DIAGNOSIS — Z853 Personal history of malignant neoplasm of breast: Secondary | ICD-10-CM | POA: Diagnosis not present

## 2020-12-30 DIAGNOSIS — C50411 Malignant neoplasm of upper-outer quadrant of right female breast: Secondary | ICD-10-CM

## 2020-12-30 DIAGNOSIS — Z780 Asymptomatic menopausal state: Secondary | ICD-10-CM | POA: Insufficient documentation

## 2020-12-30 DIAGNOSIS — Z9221 Personal history of antineoplastic chemotherapy: Secondary | ICD-10-CM | POA: Insufficient documentation

## 2020-12-30 DIAGNOSIS — Z13820 Encounter for screening for osteoporosis: Secondary | ICD-10-CM | POA: Insufficient documentation

## 2020-12-30 DIAGNOSIS — Z170 Estrogen receptor positive status [ER+]: Secondary | ICD-10-CM | POA: Insufficient documentation

## 2021-01-10 DIAGNOSIS — I6389 Other cerebral infarction: Secondary | ICD-10-CM | POA: Diagnosis not present

## 2021-01-12 ENCOUNTER — Other Ambulatory Visit: Payer: Self-pay

## 2021-01-12 ENCOUNTER — Encounter: Payer: Self-pay | Admitting: Urology

## 2021-01-12 ENCOUNTER — Ambulatory Visit (INDEPENDENT_AMBULATORY_CARE_PROVIDER_SITE_OTHER): Payer: Medicare HMO | Admitting: Urology

## 2021-01-12 ENCOUNTER — Other Ambulatory Visit: Payer: Self-pay | Admitting: Family Medicine

## 2021-01-12 VITALS — BP 135/78 | HR 94 | Ht 66.0 in | Wt 190.0 lb

## 2021-01-12 DIAGNOSIS — Z87442 Personal history of urinary calculi: Secondary | ICD-10-CM

## 2021-01-12 DIAGNOSIS — E119 Type 2 diabetes mellitus without complications: Secondary | ICD-10-CM

## 2021-01-12 DIAGNOSIS — N201 Calculus of ureter: Secondary | ICD-10-CM

## 2021-01-12 NOTE — Patient Instructions (Signed)

## 2021-01-12 NOTE — Progress Notes (Signed)
01/12/2021 3:06 PM   Leah Schaefer 06-29-1941 [id number]  Referring provider: Jerrol Banana., MD [address] [place],  [place] [address]  Chief Complaint  Patient presents with   Nephrolithiasis    HPI: 80 y.o. female presents for postop follow-up.  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   Current use of long term anticoagulation    Clopidogrel   Cystitis    Diabetes mellitus without complication (HCC)    NO MEDS-DIET CONTROLLED   GERD (gastroesophageal reflux disease)    Hemiparesis affecting left side as late effect of stroke ([place]    Hernia 2011   History of hiatal hernia    History of kidney stones    Hyperlipidemia    Hypertension    Malignant neoplasm of upper-inner quadrant of female breast ([place] 08/17/2011   Left, T2 (2.3 cm) N0, triple negative. Chemotherapy/post wide excision whole breast radiation.   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    Personal history of chemotherapy 2013   LEFT BREAST   Personal history of malignant neoplasm of breast    Personal history of radiation therapy 2017    Rt, 2013 had on left   Stroke ([facility] 09/06/2020   1 cm acute infarction in the right parietal deep and subcortical white matter   Vertigo     Surgical History: Past Surgical  History:  Procedure Laterality Date   BREAST BIOPSY Left 07/18/2011   +   BREAST BIOPSY Left 09/21/2015   neg   BREAST BIOPSY Left 04/05/2016   neg   BREAST BIOPSY Left 03/20/2019   uKoreabx 2 areas /fat necrosis at 9:30 ribbon and 5:30 coil   [facility]  [facility]2015   [facility]CLEAR [facility], NEGATIVE LN   BREAST LUMPECTOMY Left 08/17/2011   BREAST LUMPECTOMY WITH SENTINEL LYMPH NODE BIOPSY Right 09/30/2015   Procedure: BREAST LUMPECTOMY WITH SENTINEL LYMPH NODE BX;  Surgeon: JRobert Bellow MD;  Location: [facility] ORS;  Service: General;  Laterality: Right;   BREAST SURGERY Left 2012   wide excision   BREAST SURGERY Left November 2013   Core biopsy of the upper-outer quadrant showed fat necrosis.   CHOLECYSTECTOMY  2007   COLONOSCOPY  2011   Dr OCandace Cruise  COLONOSCOPY WITH PROPOFOL N/A 02/25/2015   Procedure: COLONOSCOPY WITH PROPOFOL;  Surgeon: PHulen Luster MD;  [facility];  Service: Gastroenterology;  Laterality: N/A;   CYSTOSCOPY WITH STENT PLACEMENT Left 11/05/2020   Procedure: CYSTOSCOPY WITH STENT PLACEMENT;  Surgeon: SAbbie Sons MD;  Location: [facility] ORS;  Service: Urology;  Laterality: Left;   CYSTOSCOPY/URETEROSCOPY/HOLMIUM LASER/STENT PLACEMENT Left 12/07/2020   Procedure: CYSTOSCOPY/URETEROSCOPY/HOLMIUM LASER/STENT PLACEMENT;  Surgeon: SAbbie Sons MD;  Location:  [facility] ORS;  Service: Urology;  Laterality: Left;   DILATION AND CURETTAGE OF UTERUS  2004   HERNIA REPAIR Right 05/31/8526   Umbilical/ventral hernia repaired with 6.4 cm Proceed ventral patch   PORT-A-CATH REMOVAL     PORTACATH PLACEMENT  2013   RECURRENT HERNIA N/A 01/07/2008   Recurrent ventral hernia at the umbilicus, laparoscopy, open placement of a large Ultra Pro mesh with trans-fascial sutures.   UPPER GI ENDOSCOPY  2011    Home Medications:  Allergies as of 01/12/2021   No Known Allergies       Medication List        Accurate as of January 12, 2021  3:06 PM. If you have any questions, ask your nurse or doctor.          STOP taking these medications    sulfamethoxazole-trimethoprim 800-160 MG tablet Commonly known as: BACTRIM DS Stopped by: Abbie Sons, MD       TAKE these medications    acetaminophen 500 MG tablet Commonly known as: TYLENOL Take 500 mg by mouth every 6 (six) hours as needed for mild pain or headache.   atorvastatin 40 MG tablet Commonly known as: LIPITOR TAKE 1 TABLET EVERY EVENING   buPROPion 150 MG 24 hr tablet Commonly known as: Wellbutrin XL Take 1 tablet (150 mg total) by mouth daily.   Calcium Carb-Cholecalciferol 600-800 MG-UNIT Tabs Take 1 tablet by mouth daily.   chlorthalidone 25 MG tablet Commonly known as: HYGROTON Take 1 tablet (25 mg total) by mouth daily.   cholecalciferol 1000 units tablet Commonly known as: VITAMIN D Take 1,000 Units by mouth daily.   clopidogrel 75 MG tablet Commonly known as: PLAVIX TAKE 1 TABLET EVERY DAY   gabapentin 300 MG capsule Commonly known as: NEURONTIN TAKE 2 CAPSULES AT BEDTIME   latanoprost 0.005 % ophthalmic solution Commonly known as: XALATAN Place 1 drop into both eyes at bedtime.   letrozole 2.5 MG tablet Commonly known as: FEMARA TAKE 1 TABLET (2.5 MG TOTAL) BY MOUTH DAILY.   losartan 50 MG tablet Commonly known as: COZAAR TAKE 1 TABLET (50 MG TOTAL) BY MOUTH DAILY.   meclizine 25 MG tablet Commonly known as: ANTIVERT Take 0.5 tablets (12.5 mg total) by mouth 3 (three) times daily as needed for dizziness.   meloxicam 15 MG tablet Commonly known as: MOBIC TAKE 1 TABLET EVERY DAY   metFORMIN 1000 MG tablet Commonly known as: GLUCOPHAGE TAKE 1 TABLET TWICE DAILY  WITH  A  MEAL What changed: See the new instructions.   metoprolol tartrate 25 MG tablet Commonly known as: LOPRESSOR TAKE 1/2 TABLET (12.5 MG TOTAL) TWICE DAILY What changed: See the new  instructions.   multivitamin with minerals Tabs tablet Take 1 tablet by mouth daily.   omega-3 acid ethyl esters 1 g capsule Commonly known as: LOVAZA Take 1 g by mouth daily.   pantoprazole 40 MG tablet Commonly known as: PROTONIX TAKE 1 TABLET TWICE DAILY What changed: when to take this   pyridoxine 100 MG tablet Commonly known as: B-6 Take 100 mg by mouth daily.   sertraline 100 MG tablet Commonly known as: ZOLOFT Take 0.5 tablets (50 mg total) by mouth daily. Doctor increased dose to 198m 05/2020  but patient reports that she never increased the dose. She continued taking 536mdaily. What changed:  how much to take additional instructions   vitamin B-12 500 MCG tablet Commonly known as: CYANOCOBALAMIN Take 500 mcg by mouth daily.   vitamin  C 500 MG tablet Commonly known as: ASCORBIC ACID Take 500 mg by mouth daily.   vitamin E 180 MG (400 UNITS) capsule Take 400 Units by mouth daily.        Allergies: No Known Allergies  Family History: Family History  Problem Relation Age of Onset   Lymphoma Father    Ovarian cancer Sister    Colon cancer Brother    Lymphoma Brother    Cancer Other        stomach   Cancer Other        stomach   Breast cancer Neg Hx     Social History:  reports that she has never smoked. She has never used smokeless tobacco. She reports that she does not drink alcohol and does not use drugs.   Physical Exam: BP 135/78   Pulse 94   Ht 5' 6" (1.676 m)   Wt 190 lb (86.2 kg)   BMI 30.67 kg/m   Constitutional:  Alert and oriented, No acute distress. HEENT: [place] AT, moist mucus membranes.  Trachea midline, no masses. Cardiovascular: No clubbing, cyanosis, or edema. Respiratory: Normal respiratory effort, no increased work of breathing.   Assessment & Plan:   Doing well status post ureteroscopic stone removal She had multiple small left renal calculi in addition to the 10 mm stone and have recommended a metabolic evaluation to  include 24-hour urine study and blood work She will be notified with results and a 6 month follow-up with KUB was scheduled  Abbie Sons, MD  [facility] [address] [place], [place] [address] [phone]

## 2021-01-17 ENCOUNTER — Telehealth: Payer: Self-pay

## 2021-01-17 DIAGNOSIS — E782 Mixed hyperlipidemia: Secondary | ICD-10-CM | POA: Diagnosis not present

## 2021-01-17 DIAGNOSIS — I639 Cerebral infarction, unspecified: Secondary | ICD-10-CM | POA: Diagnosis not present

## 2021-01-17 DIAGNOSIS — E119 Type 2 diabetes mellitus without complications: Secondary | ICD-10-CM | POA: Diagnosis not present

## 2021-01-17 DIAGNOSIS — I10 Essential (primary) hypertension: Secondary | ICD-10-CM | POA: Diagnosis not present

## 2021-01-17 NOTE — Progress Notes (Signed)
Chronic Care Management Pharmacy Assistant   Name: Leah Schaefer  MRN: [id number] DOB: Oct 13, 1940  Reason for Encounter:Diabetes Disease State Call.   Recent office visits:  No recent Office Visit  Recent consult visits:  01/19/2021 Dr. Rogue Bussing MD (Oncology)  01/12/2021 Dr.Stoioff MD (Urology)   Hospital visits:  None in previous 6 months  Medications: Outpatient Encounter Medications as of 01/17/2021  Medication Sig   acetaminophen (TYLENOL) 500 MG tablet Take 500 mg by mouth every 6 (six) hours as needed for mild pain or headache.    atorvastatin (LIPITOR) 40 MG tablet TAKE 1 TABLET EVERY EVENING (Patient taking differently: Take 40 mg by mouth every evening.)   buPROPion (WELLBUTRIN XL) 150 MG 24 hr tablet Take 1 tablet (150 mg total) by mouth daily.   Calcium Carb-Cholecalciferol 600-800 MG-UNIT TABS Take 1 tablet by mouth daily.   chlorthalidone (HYGROTON) 25 MG tablet Take 1 tablet (25 mg total) by mouth daily.   cholecalciferol (VITAMIN D) 1000 units tablet Take 1,000 Units by mouth daily.   clopidogrel (PLAVIX) 75 MG tablet TAKE 1 TABLET EVERY DAY (Patient taking differently: Take 75 mg by mouth daily.)   cyanocobalamin 500 MCG tablet Take 500 mcg by mouth daily.   gabapentin (NEURONTIN) 300 MG capsule TAKE 2 CAPSULES AT BEDTIME (Patient taking differently: Take 600 mg by mouth at bedtime.)   latanoprost (XALATAN) 0.005 % ophthalmic solution Place 1 drop into both eyes at bedtime.    letrozole (FEMARA) 2.5 MG tablet TAKE 1 TABLET (2.5 MG TOTAL) BY MOUTH DAILY.   losartan (COZAAR) 50 MG tablet TAKE 1 TABLET (50 MG TOTAL) BY MOUTH DAILY.   meclizine (ANTIVERT) 25 MG tablet Take 0.5 tablets (12.5 mg total) by mouth 3 (three) times daily as needed for dizziness.   meloxicam (MOBIC) 15 MG tablet TAKE 1 TABLET EVERY DAY   metFORMIN (GLUCOPHAGE) 1000 MG tablet TAKE 1 TABLET TWICE DAILY  WITH  A  MEAL   metoprolol tartrate (LOPRESSOR) 25 MG tablet TAKE 1/2 TABLET (12.5 MG  TOTAL) TWICE DAILY (Patient taking differently: Take 12.5 mg by mouth 2 (two) times daily.)   Multiple Vitamin (MULTIVITAMIN WITH MINERALS) TABS tablet Take 1 tablet by mouth daily.   omega-3 acid ethyl esters (LOVAZA) 1 g capsule Take 1 g by mouth daily.    pantoprazole (PROTONIX) 40 MG tablet TAKE 1 TABLET TWICE DAILY (Patient taking differently: Take 40 mg by mouth daily.)   pyridoxine (B-6) 100 MG tablet Take 100 mg by mouth daily.   sertraline (ZOLOFT) 100 MG tablet Take 0.5 tablets (50 mg total) by mouth daily. Doctor increased dose to 100mg  05/2020  but patient reports that she never increased the dose. She continued taking 50mg  daily. (Patient taking differently: Take 100 mg by mouth daily.)   vitamin C (ASCORBIC ACID) 500 MG tablet Take 500 mg by mouth daily.   vitamin E 400 UNIT capsule Take 400 Units by mouth daily.   No facility-administered encounter medications on file as of 01/17/2021.    Care Gaps: Patient Annual Wellness visit   Star Rating Drugs: Atorvastatin 40 mg last filled on 10/19/2020 for 90 day supply at [facility]. Losartan 50 mg last filled on 12/05/2020 for 90 day supply at [facility]. Metformin 1000 mg last filled on 11/12/2020 for 90 day supply at [facility].  Recent Relevant Labs: Lab Results  Component Value Date/Time   HGBA1C 7.5 (A) 12/13/2020 03:42 PM   HGBA1C 7.6 (H) 09/07/2020 06:41 AM  HGBA1C 7.0 (H) 07/07/2020 08:57 AM   MICROALBUR 100 04/23/2017 09:31 AM   MICROALBUR Negative 03/30/2015 02:56 PM    Kidney Function Lab Results  Component Value Date/Time   CREATININE 1.00 11/12/2020 01:40 PM   CREATININE 0.88 11/08/2020 04:00 AM   CREATININE 0.92 09/04/2014 09:16 AM   CREATININE 0.89 02/18/2014 10:48 AM   GFRNONAA >60 11/08/2020 04:00 AM   GFRNONAA >60 09/04/2014 09:16 AM   GFRNONAA >60 02/18/2014 10:48 AM   GFRAA 83 05/31/2020 02:30 PM   GFRAA >60 09/04/2014 09:16 AM   GFRAA >60 02/18/2014 10:48 AM    Current  antihyperglycemic regimen:  Metformin 1000 mg 1 tablet twice daily  Patient denies any side effects of issue with metformin.  What recent interventions/DTPs have been made to improve glycemic control:  None ID Have there been any recent hospitalizations or ED visits since last visit with CPP? No Patient denies hypoglycemic symptoms, including Pale, Sweaty, Shaky, Hungry, Nervous/irritable, and Vision changes Patient denies hyperglycemic symptoms, including blurry vision, excessive thirst, fatigue, polyuria, and weakness How often are you checking your blood sugar? once daily What are your blood sugars ranging?  Fasting:  On 01/20/2021 it was 147. On 01/19/2021 it was 156. Before meals: N/A After meals: N/A Bedtime: N/A During the week, how often does your blood glucose drop below 70? Never Are you checking your feet daily/regularly?   Patient reports tingling in both feet.  Adherence Review: Is the patient currently on a STATIN medication? Yes Is the patient currently on ACE/ARB medication? Yes Does the patient have >5 day gap between last estimated fill dates? No  Patient states she will call PCP to schedule her Annual wellness visit. Patient has a telephone follow up appointment schedule with the clinical pharmacist on 02/21/2021 at 3:30 pm.  [facility] Pharmacist Assistant [phone]

## 2021-01-19 ENCOUNTER — Other Ambulatory Visit: Payer: Self-pay | Admitting: Family Medicine

## 2021-01-19 ENCOUNTER — Inpatient Hospital Stay (HOSPITAL_BASED_OUTPATIENT_CLINIC_OR_DEPARTMENT_OTHER): Payer: Medicare HMO | Admitting: Internal Medicine

## 2021-01-19 ENCOUNTER — Inpatient Hospital Stay: Payer: Medicare HMO | Attending: Internal Medicine

## 2021-01-19 ENCOUNTER — Other Ambulatory Visit: Payer: Self-pay

## 2021-01-19 DIAGNOSIS — Z79899 Other long term (current) drug therapy: Secondary | ICD-10-CM | POA: Insufficient documentation

## 2021-01-19 DIAGNOSIS — Z808 Family history of malignant neoplasm of other organs or systems: Secondary | ICD-10-CM | POA: Insufficient documentation

## 2021-01-19 DIAGNOSIS — Z8673 Personal history of transient ischemic attack (TIA), and cerebral infarction without residual deficits: Secondary | ICD-10-CM | POA: Diagnosis not present

## 2021-01-19 DIAGNOSIS — Z87442 Personal history of urinary calculi: Secondary | ICD-10-CM | POA: Diagnosis not present

## 2021-01-19 DIAGNOSIS — R42 Dizziness and giddiness: Secondary | ICD-10-CM | POA: Insufficient documentation

## 2021-01-19 DIAGNOSIS — Z170 Estrogen receptor positive status [ER+]: Secondary | ICD-10-CM

## 2021-01-19 DIAGNOSIS — Z800 Family history of malignant neoplasm of digestive organs: Secondary | ICD-10-CM | POA: Insufficient documentation

## 2021-01-19 DIAGNOSIS — Z923 Personal history of irradiation: Secondary | ICD-10-CM | POA: Diagnosis not present

## 2021-01-19 DIAGNOSIS — Z7901 Long term (current) use of anticoagulants: Secondary | ICD-10-CM | POA: Diagnosis not present

## 2021-01-19 DIAGNOSIS — C50411 Malignant neoplasm of upper-outer quadrant of right female breast: Secondary | ICD-10-CM | POA: Insufficient documentation

## 2021-01-19 DIAGNOSIS — G8929 Other chronic pain: Secondary | ICD-10-CM | POA: Insufficient documentation

## 2021-01-19 DIAGNOSIS — K2970 Gastritis, unspecified, without bleeding: Secondary | ICD-10-CM

## 2021-01-19 DIAGNOSIS — Z9049 Acquired absence of other specified parts of digestive tract: Secondary | ICD-10-CM | POA: Diagnosis not present

## 2021-01-19 DIAGNOSIS — Z7902 Long term (current) use of antithrombotics/antiplatelets: Secondary | ICD-10-CM | POA: Diagnosis not present

## 2021-01-19 DIAGNOSIS — M549 Dorsalgia, unspecified: Secondary | ICD-10-CM | POA: Insufficient documentation

## 2021-01-19 DIAGNOSIS — M2550 Pain in unspecified joint: Secondary | ICD-10-CM | POA: Diagnosis not present

## 2021-01-19 DIAGNOSIS — R0789 Other chest pain: Secondary | ICD-10-CM | POA: Diagnosis not present

## 2021-01-19 DIAGNOSIS — Z8041 Family history of malignant neoplasm of ovary: Secondary | ICD-10-CM | POA: Insufficient documentation

## 2021-01-19 DIAGNOSIS — I10 Essential (primary) hypertension: Secondary | ICD-10-CM | POA: Diagnosis not present

## 2021-01-19 DIAGNOSIS — E119 Type 2 diabetes mellitus without complications: Secondary | ICD-10-CM | POA: Diagnosis not present

## 2021-01-19 LAB — COMPREHENSIVE METABOLIC PANEL
ALT: 16 U/L (ref 0–44)
AST: 16 U/L (ref 15–41)
Albumin: 4.4 g/dL (ref 3.5–5.0)
Alkaline Phosphatase: 64 U/L (ref 38–126)
Anion gap: 10 (ref 5–15)
BUN: 19 mg/dL (ref 8–23)
CO2: 27 mmol/L (ref 22–32)
Calcium: 10 mg/dL (ref 8.9–10.3)
Chloride: 100 mmol/L (ref 98–111)
Creatinine, Ser: 0.84 mg/dL (ref 0.44–1.00)
GFR, Estimated: >60 mL/min (ref >60)
Glucose, Bld: 141 mg/dL — ABNORMAL HIGH (ref 70–99)
Potassium: 3.5 mmol/L (ref 3.5–5.1)
Sodium: 137 mmol/L (ref 135–145)
Total Bilirubin: 0.5 mg/dL (ref 0.3–1.2)
Total Protein: 8.1 g/dL (ref 6.5–8.1)

## 2021-01-19 LAB — CBC WITH DIFFERENTIAL/PLATELET
Abs Immature Granulocytes: 0.02 10*3/uL (ref 0.00–0.07)
Basophils Absolute: 0.1 10*3/uL (ref 0.0–0.1)
Basophils Relative: 1 %
Eosinophils Absolute: 0.3 10*3/uL (ref 0.0–0.5)
Eosinophils Relative: 4 %
HCT: 38.8 % (ref 36.0–46.0)
Hemoglobin: 13.2 g/dL (ref 12.0–15.0)
Immature Granulocytes: 0 %
Lymphocytes Relative: 29 %
Lymphs Abs: 1.9 10*3/uL (ref 0.7–4.0)
MCH: 30.2 pg (ref 26.0–34.0)
MCHC: 34 g/dL (ref 30.0–36.0)
MCV: 88.8 fL (ref 80.0–100.0)
Monocytes Absolute: 0.6 10*3/uL (ref 0.1–1.0)
Monocytes Relative: 8 %
Neutro Abs: 3.8 10*3/uL (ref 1.7–7.7)
Neutrophils Relative %: 58 %
Platelets: 273 10*3/uL (ref 150–400)
RBC: 4.37 MIL/uL (ref 3.87–5.11)
RDW: 12.4 % (ref 11.5–15.5)
WBC: 6.6 10*3/uL (ref 4.0–10.5)
nRBC: 0 % (ref 0.0–0.2)

## 2021-01-19 NOTE — Progress Notes (Signed)
[place] OFFICE PROGRESS NOTE  Patient Care Team: Jerrol Banana., MD as PCP - General (Family Medicine) Bary Castilla, Forest Gleason, MD (General Surgery) Cammie Sickle, MD as Consulting Physician (Internal Medicine) Lorelee Cover., MD as Consulting Physician (Ophthalmology) Noreene Filbert, MD as Referring Physician (Radiation Oncology) Germaine Pomfret, [facility] as Pharmacist (Pharmacist)   SUMMARY OF HEMATOLOGIC/ONCOLOGIC HISTORY:  Oncology History Overview Note  # FEB 2017- RIGHT BREAST  STAGE I [T1b N0 M0]. ER/PR ER positive 100% PR positive 95% HER 2 FISH negative;  INVASIVE MAMMARY CARCINOMA WITH MICROPAPILLARY FEATURES. S/p Lumpec [Dr.Byrnett] & RT; No Oncotype; Femara  # 2013- LEFT BREAST- STAGE II; ER/PR/ Her 2 NEG s/p Lumpec [Dr.Byrnett] & RT  # BMD- April  2017- wnl; genetic testing- declined [May 2017]  # Vertigo [uses rolling walker]   Carcinoma of upper-outer quadrant of right breast in female, estrogen receptor positive ([place])     INTERVAL HISTORY:   A very pleasant 80 year old African-American female patient with above history of breast cancer most recently diagnosed in February 2017 is here for follow-up; patient currently on Femara.  In the interim patient diagnosed with stroke.  Currently improved-mild deficits.    Denies any unusual lumps or bumps.  Appetite is fair.  Chronic back pain not any worse.  Review of Systems  Constitutional:  Negative for chills, diaphoresis, fever, malaise/fatigue and weight loss.  HENT:  Negative for nosebleeds and sore throat.   Eyes:  Negative for double vision.  Respiratory:  Negative for cough, hemoptysis, sputum production, shortness of breath and wheezing.   Cardiovascular:  Negative for chest pain, palpitations, orthopnea and leg swelling.  Gastrointestinal:  Negative for abdominal pain, blood in stool, constipation, diarrhea, heartburn, melena, nausea and vomiting.  Genitourinary:  Negative for  dysuria, frequency and urgency.  Musculoskeletal:  Positive for back pain and joint pain.  Skin: Negative.  Negative for itching and rash.  Neurological:  Negative for dizziness, tingling, focal weakness, weakness and headaches.  Endo/Heme/Allergies:  Does not bruise/bleed easily.  Psychiatric/Behavioral:  Negative for depression. The patient is not nervous/anxious and does not have insomnia.     PAST MEDICAL HISTORY :  Past Medical History:  Diagnosis Date  . Anxiety   . Aortic atherosclerosis ([place])   . Arthritis   . Breast cancer ([place]) 2013   LEFT breast with chemo and rad tx  . Breast cancer ([place]) 2017   right breast with rad tx  . Breast cancer of upper-inner quadrant of right female breast ([place]) 09/21/2015   T1bN0 " 30m; ER100%, PR 90%, HER-2/neu not overexpressed.  . Current use of long term anticoagulation    Clopidogrel  . Cystitis   . Diabetes mellitus without complication (HCC)    NO MEDS-DIET CONTROLLED  . GERD (gastroesophageal reflux disease)   . Hemiparesis affecting left side as late effect of stroke ([place]   . Hernia 2011  . History of hiatal hernia   . History of kidney stones   . Hyperlipidemia   . Hypertension   . Malignant neoplasm of upper-inner quadrant of female breast ([place] 08/17/2011   Left, T2 (2.3 cm) N0, triple negative. Chemotherapy/post wide excision whole breast radiation.  . 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 Personal history of chemotherapy 2013   LEFT BREAST  . Personal history of malignant neoplasm of breast   . Personal history of radiation therapy 2017    Rt, 2013 had on left  .  Stroke ([place]) 09/06/2020   1 cm acute infarction in the right parietal deep and subcortical white matter  . Vertigo     PAST SURGICAL HISTORY :   Past Surgical History:  Procedure Laterality Date  . BREAST BIOPSY Left 07/18/2011   +  . BREAST BIOPSY Left 09/21/2015   neg  . BREAST BIOPSY Left 04/05/2016   neg  .  BREAST BIOPSY Left 03/20/2019   [place] bx 2 areas /fat necrosis at 9:30 ribbon and 5:30 coil  . BREAST EXCISIONAL BIOPSY Right 08/2015   [place]  . BREAST LUMPECTOMY Right 09/30/2015   [facility] WITH MICROPAPILLARY FEATURES AND DCIS/CLEAR MARGINS, NEGATIVE LN  . BREAST LUMPECTOMY Left 08/17/2011  . BREAST LUMPECTOMY WITH SENTINEL LYMPH NODE BIOPSY Right 09/30/2015   Procedure: BREAST LUMPECTOMY WITH SENTINEL LYMPH NODE BX;  Surgeon: Robert Bellow, MD;  Location: [facility] ORS;  Service: General;  Laterality: Right;  . BREAST SURGERY Left 2012   wide excision  . BREAST SURGERY Left November 2013   Core biopsy of the upper-outer quadrant showed fat necrosis.  . CHOLECYSTECTOMY  2007  . COLONOSCOPY  2011   Dr Candace Cruise  . COLONOSCOPY WITH PROPOFOL N/A 02/25/2015   Procedure: COLONOSCOPY WITH PROPOFOL;  Surgeon: Hulen Luster, MD;  Location: [facility] ENDOSCOPY;  Service: Gastroenterology;  Laterality: N/A;  . CYSTOSCOPY WITH STENT PLACEMENT Left 11/05/2020   Procedure: CYSTOSCOPY WITH STENT PLACEMENT;  Surgeon: Abbie Sons, MD;  Location: [facility] ORS;  Service: Urology;  Laterality: Left;  . CYSTOSCOPY/URETEROSCOPY/HOLMIUM LASER/STENT PLACEMENT Left 12/07/2020   Procedure: CYSTOSCOPY/URETEROSCOPY/HOLMIUM LASER/STENT PLACEMENT;  Surgeon: Abbie Sons, MD;  Location: [facility] ORS;  Service: Urology;  Laterality: Left;  . DILATION AND CURETTAGE OF UTERUS  2004  . HERNIA REPAIR Right 33/29/5188   Umbilical/ventral hernia repaired with 6.4 cm Proceed ventral patch  . PORT-A-CATH REMOVAL    . PORTACATH PLACEMENT  2013  . 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  Marland Kitchen UPPER GI ENDOSCOPY  2011    FAMILY HISTORY :   Family History  Problem Relation Age of Onset  . Lymphoma Father   . Ovarian cancer Sister   . Colon cancer Brother   . Lymphoma Brother   . Cancer Other        stomach  . Cancer Other         stomach  . Breast cancer Neg Hx     SOCIAL HISTORY:   Social History   Tobacco Use  . Smoking status: Never  . Smokeless tobacco: Never  Vaping Use  . Vaping Use: Never used  Substance Use Topics  . Alcohol use: No  . Drug use: No    ALLERGIES:  has No Known Allergies.  MEDICATIONS:  Current Outpatient Medications  Medication Sig Dispense Refill  . acetaminophen (TYLENOL) 500 MG tablet Take 500 mg by mouth every 6 (six) hours as needed for mild pain or headache.     Marland Kitchen atorvastatin (LIPITOR) 40 MG tablet TAKE 1 TABLET EVERY EVENING (Patient taking differently: Take 40 mg by mouth every evening.) 90 tablet 3  . buPROPion (WELLBUTRIN XL) 150 MG 24 hr tablet Take 1 tablet (150 mg total) by mouth daily. 90 tablet 3  . Calcium Carb-Cholecalciferol 600-800 MG-UNIT TABS Take 1 tablet by mouth daily.    . chlorthalidone (HYGROTON) 25 MG tablet Take 1 tablet (25 mg total) by mouth daily. 90 tablet  4  . cholecalciferol (VITAMIN D) 1000 units tablet Take 1,000 Units by mouth daily.    . clopidogrel (PLAVIX) 75 MG tablet TAKE 1 TABLET EVERY DAY (Patient taking differently: Take 75 mg by mouth daily.) 90 tablet 1  . cyanocobalamin 500 MCG tablet Take 500 mcg by mouth daily.    Marland Kitchen gabapentin (NEURONTIN) 300 MG capsule TAKE 2 CAPSULES AT BEDTIME (Patient taking differently: Take 600 mg by mouth at bedtime.) 180 capsule 3  . latanoprost (XALATAN) 0.005 % ophthalmic solution Place 1 drop into both eyes at bedtime.     Marland Kitchen letrozole (FEMARA) 2.5 MG tablet TAKE 1 TABLET (2.5 MG TOTAL) BY MOUTH DAILY. 90 tablet 3  . losartan (COZAAR) 50 MG tablet TAKE 1 TABLET (50 MG TOTAL) BY MOUTH DAILY. 90 tablet 0  . meclizine (ANTIVERT) 25 MG tablet Take 0.5 tablets (12.5 mg total) by mouth 3 (three) times daily as needed for dizziness. 15 tablet 0  . meloxicam (MOBIC) 15 MG tablet TAKE 1 TABLET EVERY DAY 90 tablet 1  . metFORMIN (GLUCOPHAGE) 1000 MG tablet TAKE 1 TABLET TWICE DAILY  WITH  A  MEAL 180 tablet 0  .  metoprolol tartrate (LOPRESSOR) 25 MG tablet TAKE 1/2 TABLET (12.5 MG TOTAL) TWICE DAILY (Patient taking differently: Take 12.5 mg by mouth 2 (two) times daily.) 90 tablet 3  . Multiple Vitamin (MULTIVITAMIN WITH MINERALS) TABS tablet Take 1 tablet by mouth daily.    Marland Kitchen omega-3 acid ethyl esters (LOVAZA) 1 g capsule Take 1 g by mouth daily.     Marland Kitchen pyridoxine (B-6) 100 MG tablet Take 100 mg by mouth daily.    . sertraline (ZOLOFT) 100 MG tablet Take 0.5 tablets (50 mg total) by mouth daily. Doctor increased dose to 162m 05/2020  but patient reports that she never increased the dose. She continued taking 58mdaily. (Patient taking differently: Take 100 mg by mouth daily.) 30 tablet 2  . vitamin C (ASCORBIC ACID) 500 MG tablet Take 500 mg by mouth daily.    . vitamin E 400 UNIT capsule Take 400 Units by mouth daily.    . pantoprazole (PROTONIX) 40 MG tablet TAKE 1 TABLET TWICE DAILY 180 tablet 1   No current facility-administered medications for this visit.    PHYSICAL EXAMINATION: ECOG PERFORMANCE STATUS: 0 - Asymptomatic  BP 128/85   Pulse 73   Temp (!) 97.3 F (36.3 C)   Wt 185 lb 14.4 oz (84.3 kg)   SpO2 98%   BMI 30.01 kg/m   Filed Weights   01/19/21 0935  Weight: 185 lb 14.4 oz (84.3 kg)   Physical Exam HENT:     Head: Normocephalic and atraumatic.     Mouth/Throat:     Pharynx: No oropharyngeal exudate.  Eyes:     Pupils: Pupils are equal, round, and reactive to light.  Cardiovascular:     Rate and Rhythm: Normal rate and regular rhythm.  Pulmonary:     Effort: No respiratory distress.     Breath sounds: No wheezing.  Abdominal:     General: Bowel sounds are normal. There is no distension.     Palpations: Abdomen is soft. There is no mass.     Tenderness: no abdominal tenderness There is no guarding or rebound.  Musculoskeletal:        General: No tenderness. Normal range of motion.     Cervical back: Normal range of motion and neck supple.  Skin:    General: Skin  is warm.  Neurological:     Mental Status: She is alert and oriented to person, place, and time.  Psychiatric:        Mood and Affect: Affect normal.     LABORATORY DATA:  I have reviewed the data as listed    Component Value Date/Time   NA 137 01/19/2021 0909   NA 140 11/12/2020 1340   NA 140 12/23/2012 0949   K 3.5 01/19/2021 0909   K 4.7 12/23/2012 0949   CL 100 01/19/2021 0909   CL 101 12/23/2012 0949   CO2 27 01/19/2021 0909   CO2 30 12/23/2012 0949   GLUCOSE 141 (H) 01/19/2021 0909   GLUCOSE 168 (H) 12/23/2012 0949   BUN 19 01/19/2021 0909   BUN 11 11/12/2020 1340   BUN 9 12/23/2012 0949   CREATININE 0.84 01/19/2021 0909   CREATININE 0.92 09/04/2014 0916   CALCIUM 10.0 01/19/2021 0909   CALCIUM 9.8 12/23/2012 0949   PROT 8.1 01/19/2021 0909   PROT 7.6 11/12/2020 1340   PROT 7.4 09/04/2014 0916   ALBUMIN 4.4 01/19/2021 0909   ALBUMIN 4.0 11/12/2020 1340   ALBUMIN 3.7 09/04/2014 0916   AST 16 01/19/2021 0909   AST 21 09/04/2014 0916   ALT 16 01/19/2021 0909   ALT 34 09/04/2014 0916   ALKPHOS 64 01/19/2021 0909   ALKPHOS 85 09/04/2014 0916   BILITOT 0.5 01/19/2021 0909   BILITOT 0.3 11/12/2020 1340   BILITOT 0.4 09/04/2014 0916   GFRNONAA >60 01/19/2021 0909   GFRNONAA >60 09/04/2014 0916   GFRNONAA >60 02/18/2014 1048   GFRAA 83 05/31/2020 1430   GFRAA >60 09/04/2014 0916   GFRAA >60 02/18/2014 1048    No results found for: SPEP, UPEP  Lab Results  Component Value Date   WBC 6.6 01/19/2021   NEUTROABS 3.8 01/19/2021   HGB 13.2 01/19/2021   HCT 38.8 01/19/2021   MCV 88.8 01/19/2021   PLT 273 01/19/2021      Chemistry      Component Value Date/Time   NA 137 01/19/2021 0909   NA 140 11/12/2020 1340   NA 140 12/23/2012 0949   K 3.5 01/19/2021 0909   K 4.7 12/23/2012 0949   CL 100 01/19/2021 0909   CL 101 12/23/2012 0949   CO2 27 01/19/2021 0909   CO2 30 12/23/2012 0949   BUN 19 01/19/2021 0909   BUN 11 11/12/2020 1340   BUN 9 12/23/2012  0949   CREATININE 0.84 01/19/2021 0909   CREATININE 0.92 09/04/2014 0916      Component Value Date/Time   CALCIUM 10.0 01/19/2021 0909   CALCIUM 9.8 12/23/2012 0949   ALKPHOS 64 01/19/2021 0909   ALKPHOS 85 09/04/2014 0916   AST 16 01/19/2021 0909   AST 21 09/04/2014 0916   ALT 16 01/19/2021 0909   ALT 34 09/04/2014 0916   BILITOT 0.5 01/19/2021 0909   BILITOT 0.3 11/12/2020 1340   BILITOT 0.4 09/04/2014 0916       ASSESSMENT & PLAN:   Carcinoma of upper-outer quadrant of right breast in female, estrogen receptor positive ([place]) # 2017 right breast cancer stage I ER/PR positive HER-2/neu negative status post lumpectomy. No chemotherapy. Status post adjuvant radiation.  STABLE. Mammo- AUg 2021- WNL [Aug 6811].   # Currently on Femara; continue the same. Tolerating well without any side effects.  # BMD: May 2022- T-score of -0.4. continue calcium and vitamin D.  STABLE.  #Stroke-[2022]- with mild residual deficits.  No evidence of any malignancy  on imaging.  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

## 2021-01-19 NOTE — Telephone Encounter (Signed)
Requested Prescriptions  Pending Prescriptions Disp Refills  . pantoprazole (PROTONIX) 40 MG tablet [Pharmacy Med Name: PANTOPRAZOLE SODIUM 40 MG Tablet Delayed Release] 180 tablet 1    Sig: TAKE 1 TABLET TWICE DAILY     Gastroenterology: Proton Pump Inhibitors Passed - 01/19/2021  7:00 PM      Passed - Valid encounter within last 12 months    Recent Outpatient Visits          1 month ago Type 2 diabetes mellitus with complication, without long-term current use of insulin [facility])   [facility] Jerrol Banana., MD   2 months ago Urinary tract infection without hematuria, site unspecified   [place], FNP   4 months ago Cerebrovascular accident (CVA), unspecified mechanism [facility])   [facility] Jerrol Banana., MD   6 months ago Controlled type 2 diabetes mellitus without complication, without long-term current use of insulin [facility])   [facility] Jerrol Banana., MD   7 months ago Urinary tract infection without hematuria, site unspecified   [facility] Flinch[place], Kelby Aline, FNP      Future Appointments            In 2 months Jerrol Banana., MD [facility], [place]   In 5 months [place], Ronda Fairly, MD [place]

## 2021-01-19 NOTE — Assessment & Plan Note (Addendum)
#  2017 right breast cancer stage I ER/PR positive HER-2/neu negative status post lumpectomy. No chemotherapy. Status post adjuvant radiation.  STABLE. Mammo- AUg 2021- WNL [Aug 5369].   # Currently on Femara; continue the same. Tolerating well without any side effects.  # BMD: May 2022- T-score of -0.4. continue calcium and vitamin D.  STABLE.  #Stroke-[2022]- with mild residual deficits.  No evidence of any malignancy on imaging.  # Right chest wall pain secondary to T10-T12 impingement-stable  # DISPOSITION:  Follow up in 6 months/labs-cbc/cmp/ca-27-29; -/MD- dr.B

## 2021-01-20 LAB — CANCER ANTIGEN 27.29: CA 27.29: 16.5 U/mL (ref 0.0–38.6)

## 2021-01-24 ENCOUNTER — Other Ambulatory Visit: Payer: Medicare HMO

## 2021-01-24 ENCOUNTER — Other Ambulatory Visit: Payer: Self-pay

## 2021-01-24 DIAGNOSIS — G3184 Mild cognitive impairment, so stated: Secondary | ICD-10-CM | POA: Diagnosis not present

## 2021-01-24 DIAGNOSIS — F4321 Adjustment disorder with depressed mood: Secondary | ICD-10-CM | POA: Diagnosis not present

## 2021-01-24 DIAGNOSIS — I6930 Unspecified sequelae of cerebral infarction: Secondary | ICD-10-CM | POA: Diagnosis not present

## 2021-01-24 DIAGNOSIS — G939 Disorder of brain, unspecified: Secondary | ICD-10-CM | POA: Diagnosis not present

## 2021-01-24 DIAGNOSIS — N200 Calculus of kidney: Secondary | ICD-10-CM | POA: Diagnosis not present

## 2021-01-25 DIAGNOSIS — N200 Calculus of kidney: Secondary | ICD-10-CM | POA: Diagnosis not present

## 2021-02-15 ENCOUNTER — Other Ambulatory Visit: Payer: Self-pay | Admitting: General Surgery

## 2021-02-15 DIAGNOSIS — Z853 Personal history of malignant neoplasm of breast: Secondary | ICD-10-CM

## 2021-02-18 ENCOUNTER — Telehealth: Payer: Self-pay

## 2021-02-18 NOTE — Progress Notes (Signed)
    Chronic Care Management Pharmacy Assistant   Name: Leah Schaefer  MRN: [id number] DOB: 02-07-1941  Patient called to remind of appointment with [name], CPP on 02/21/2021 @ 1530.  No answer, left message of appointment date, time and type of appointment (either telephone or in person). Left message to have all medications, supplements, blood pressure and/or blood sugar logs available during appointment and to return call if need to reschedule.  OR: Patient aware of appointment date, time, and type of appointment (either telephone or in person). Patient aware to have/bring all medications, supplements, blood pressure and/or blood sugar logs to visit.  Questions: Have you had any recent office visit or specialist visit outside of [place]?  Are there any concerns you would like to discuss during your office visit?  Are you having any problems obtaining your medications? (Whether it pharmacy issues or cost)  If patient has any PAP medications ask if they are having any problems getting their PAP medication or refill?  Star Rating Drug: Metformin 1000 mg last filled on 11/12/2020 for a 90-Day supply with [place] Losartan 50 mg last filled on 12/05/2020 for a 90-Day supply with [place] Atorvastatin 40 mg last filled on 12/29/2020 for a 90-Day supply no Pharmacy indicated   Any gaps in medications fill history? Yes not in any of her star rating drugs but did find it with her Gabapentin 300 mg last filled 06/16/2020 90 day supply with [place] Gaps: Zoster Vaccines- Shingrix, FOOT EXAM, OPHTHALMOLOGY EXAM, COVID-19 Vaccine   [name], CPA/CMA Clinical Pharmacist Assistant Phone: [phone]

## 2021-02-21 ENCOUNTER — Ambulatory Visit (INDEPENDENT_AMBULATORY_CARE_PROVIDER_SITE_OTHER): Payer: Medicare HMO

## 2021-02-21 DIAGNOSIS — E1159 Type 2 diabetes mellitus with other circulatory complications: Secondary | ICD-10-CM | POA: Diagnosis not present

## 2021-02-21 DIAGNOSIS — I152 Hypertension secondary to endocrine disorders: Secondary | ICD-10-CM | POA: Diagnosis not present

## 2021-02-21 DIAGNOSIS — E118 Type 2 diabetes mellitus with unspecified complications: Secondary | ICD-10-CM | POA: Diagnosis not present

## 2021-02-21 DIAGNOSIS — E785 Hyperlipidemia, unspecified: Secondary | ICD-10-CM | POA: Diagnosis not present

## 2021-02-21 DIAGNOSIS — E1169 Type 2 diabetes mellitus with other specified complication: Secondary | ICD-10-CM | POA: Diagnosis not present

## 2021-02-21 NOTE — Progress Notes (Signed)
Chronic Care Management Pharmacy Note  03/14/2021 Name:  Leah Schaefer MRN:  [id number] DOB:  02/05/1941  Summary: Patient presents for CCM follow-up. She reports doing well although she has had instances of elevated blood pressure recently.  Recommendations/Changes made from today's visit: Continue current medications  Plan: CPP follow-up in 3 months.   Subjective: MALORI MYERS is an 80 y.o. year old female who is a primary patient of Jerrol Banana., MD.  The CCM team was consulted for assistance with disease management and care coordination needs.    Engaged with patient by telephone for follow up visit in response to provider referral for pharmacy case management and/or care coordination services.   Consent to Services:  The patient was given information about Chronic Care Management services, agreed to services, and gave verbal consent prior to initiation of services.  Please see initial visit note for detailed documentation.   Patient Care Team: Jerrol Banana., MD as PCP - General (Family Medicine) Bary Castilla, Forest Gleason, MD (General Surgery) Cammie Sickle, MD as Consulting Physician (Internal Medicine) Lorelee Cover., MD as Consulting Physician (Ophthalmology) Noreene Filbert, MD as Referring Physician (Radiation Oncology) Germaine Pomfret, [facility] as Pharmacist (Pharmacist)  Recent office visits: 12/13/20: Patient presented to Dr. Rosanna Randy for follow-up.  Recent consult visits: None in previous 6 months  Hospital visits: None in previous 6 months   Objective:  Lab Results  Component Value Date   CREATININE 0.84 01/19/2021   BUN 19 01/19/2021   GFRNONAA >60 01/19/2021   GFRAA 83 05/31/2020   NA 137 01/19/2021   K 3.5 01/19/2021   CALCIUM 10.0 01/19/2021   CO2 27 01/19/2021   GLUCOSE 141 (H) 01/19/2021    Lab Results  Component Value Date/Time   HGBA1C 7.5 (A) 12/13/2020 03:42 PM   HGBA1C 7.6 (H) 09/07/2020 06:41 AM   HGBA1C  7.0 (H) 07/07/2020 08:57 AM   MICROALBUR 100 04/23/2017 09:31 AM   MICROALBUR Negative 03/30/2015 02:56 PM    Last diabetic Eye exam:  Lab Results  Component Value Date/Time   HMDIABEYEEXA No Retinopathy 01/09/2018 12:00 AM    Last diabetic Foot exam:  Lab Results  Component Value Date/Time   HMDIABFOOTEX normal 03/22/2017 12:00 AM     Lab Results  Component Value Date   CHOL 105 09/07/2020   HDL 30 (L) 09/07/2020   LDLCALC 57 09/07/2020   TRIG 89 09/07/2020   CHOLHDL 3.5 09/07/2020    Hepatic Function Latest Ref Rng & Units 01/19/2021 11/12/2020 11/05/2020  Total Protein 6.5 - 8.1 g/dL 8.1 7.6 7.9  Albumin 3.5 - 5.0 g/dL 4.4 4.0 4.4  AST 15 - 41 U/L _0 ALT 0 - 44 U/L 16 40(H) 15  Alk Phosphatase 38 - 126 U/L 64 85 52  Total Bilirubin 0.3 - 1.2 mg/dL 0.5 0.3 0.9  Bilirubin, Direct 0.1 - 0.5 mg/dL - - -    Lab Results  Component Value Date/Time   TSH 1.760 05/31/2020 02:30 PM   TSH 1.920 01/21/2020 09:08 AM    CBC Latest Ref Rng & Units 01/19/2021 11/12/2020 11/08/2020  WBC 4.0 - 10.5 K/uL 6.6 11.1(H) 9.9  Hemoglobin 12.0 - 15.0 g/dL 13.2 12.8 11.6(L)  Hematocrit 36.0 - 46.0 % 38.8 37.3 34.6(L)  Platelets 150 - 400 K/uL 273 356 217    No results found for: VD25OH  Clinical ASCVD: Yes  The ASCVD Risk score Mikey Bussing DC Jr., et al., 2013) failed to calculate  for the following reasons:   The patient has a prior MI or stroke diagnosis    Depression screen [facility] 2/9 12/13/2020 07/07/2020 05/03/2020  Decreased Interest 1 1 0  Down, Depressed, Hopeless 1 1 0  PHQ - 2 Score 2 2 0  Altered sleeping 0 0 0  Tired, decreased energy 1 1 0  Change in appetite 0 0 0  Feeling bad or failure about yourself  0 0 0  Trouble concentrating 0 0 1  Moving slowly or fidgety/restless 0 0 0  Suicidal thoughts 0 0 0  PHQ-9 Score _0 Difficult doing work/chores Not difficult at all Not difficult at all Not difficult at all  Some recent data might be hidden    Social History   Tobacco  Use  Smoking Status Never  Smokeless Tobacco Never   BP Readings from Last 3 Encounters:  01/19/21 128/85  01/12/21 135/78  12/13/20 111/74   Pulse Readings from Last 3 Encounters:  01/19/21 73  01/12/21 94  12/13/20 69   Wt Readings from Last 3 Encounters:  01/19/21 185 lb 14.4 oz (84.3 kg)  01/12/21 190 lb (86.2 kg)  12/13/20 188 lb (85.3 kg)   BMI Readings from Last 3 Encounters:  01/19/21 30.01 kg/m  01/12/21 30.67 kg/m  12/13/20 30.34 kg/m    Assessment/Interventions: Review of patient past medical history, allergies, medications, health status, including review of consultants reports, laboratory and other test data, was performed as part of comprehensive evaluation and provision of chronic care management services.   SDOH:  (Social Determinants of Health) assessments and interventions performed: Yes SDOH Interventions    Flowsheet Row Most Recent Value  SDOH Interventions   Financial Strain Interventions Intervention Not Indicated      SDOH Screenings   Alcohol Screen: Low Risk    Last Alcohol Screening Score (AUDIT): 2  Depression (PHQ2-9): Low Risk    PHQ-2 Score: 3  Financial Resource Strain: Low Risk    Difficulty of Paying Living Expenses: Not hard at all  Food Insecurity: Not on file  Housing: Not on file  Physical Activity: Not on file  Social Connections: Not on file  Stress: Not on file  Tobacco Use: Low Risk    Smoking Tobacco Use: Never   Smokeless Tobacco Use: Never  Transportation Needs: Not on file    [place]  No Known Allergies  Medications Reviewed Today     Reviewed by Abbie Sons, MD (Physician) on 01/12/21 at 1523  Med List Status: <None>   Medication Order Taking? Sig Documenting Provider Last Dose Status Informant  acetaminophen (TYLENOL) 500 MG tablet [id number] Yes Take 500 mg by mouth every 6 (six) hours as needed for mild pain or headache.  [provider] Taking Active Self  atorvastatin (LIPITOR) 40  MG tablet [id number] Yes TAKE 1 TABLET EVERY EVENING  Patient taking differently: Take 40 mg by mouth every evening.   Jerrol Banana., MD Taking Active   buPROPion (WELLBUTRIN XL) 150 MG 24 hr tablet [id number] Yes Take 1 tablet (150 mg total) by mouth daily. Jerrol Banana., MD Taking Active   Calcium Carb-Cholecalciferol 600-800 MG-UNIT TABS [id number] Yes Take 1 tablet by mouth daily. [provider] Taking Active Self  chlorthalidone (HYGROTON) 25 MG tablet [id number] Yes Take 1 tablet (25 mg total) by mouth daily. Jerrol Banana., MD Taking Active Self  cholecalciferol (VITAMIN D) 1000 units tablet [id number] Yes Take 1,000 Units by mouth  daily. [provider] Taking Active Self  clopidogrel (PLAVIX) 75 MG tablet [id number] Yes TAKE 1 TABLET EVERY DAY  Patient taking differently: Take 75 mg by mouth daily.   Jerrol Banana., MD Taking Active Self  cyanocobalamin 500 MCG tablet [id number] Yes Take 500 mcg by mouth daily. [provider] Taking Active Self  gabapentin (NEURONTIN) 300 MG capsule [id number] Yes TAKE 2 CAPSULES AT BEDTIME  Patient taking differently: Take 600 mg by mouth at bedtime.   Jerrol Banana., MD Taking Active   latanoprost (XALATAN) 0.005 % ophthalmic solution [id number] Yes Place 1 drop into both eyes at bedtime.  [provider] Taking Active Self  letrozole [facility]) 2.5 MG tablet [id number] Yes TAKE 1 TABLET (2.5 MG TOTAL) BY MOUTH DAILY. Jerrol Banana., MD Taking Active Self  losartan (COZAAR) 50 MG tablet [id number] Yes TAKE 1 TABLET (50 MG TOTAL) BY MOUTH DAILY. Jerrol Banana., MD Taking Active   meclizine (ANTIVERT) 25 MG tablet [id number] Yes Take 0.5 tablets (12.5 mg total) by mouth 3 (three) times daily as needed for dizziness. Hinda Kehr, MD Taking Active Self  meloxicam [facility]) 15 MG tablet [id number] Yes TAKE 1 TABLET EVERY DAY Jerrol Banana., MD Taking Active   metFORMIN  (GLUCOPHAGE) 1000 MG tablet [id number] Yes TAKE 1 TABLET TWICE DAILY  WITH  A  MEAL Jerrol Banana., MD Taking Active   metoprolol tartrate (LOPRESSOR) 25 MG tablet [id number] Yes TAKE 1/2 TABLET (12.5 MG TOTAL) TWICE DAILY  Patient taking differently: Take 12.5 mg by mouth 2 (two) times daily.   Jerrol Banana., MD Taking Active   Multiple Vitamin (MULTIVITAMIN WITH MINERALS) TABS tablet [id number] Yes Take 1 tablet by mouth daily. [provider] Taking Active Self  omega-3 acid ethyl esters (LOVAZA) 1 g capsule [id number] Yes Take 1 g by mouth daily.  [provider] Taking Active Self  pantoprazole (PROTONIX) 40 MG tablet [id number] Yes TAKE 1 TABLET TWICE DAILY  Patient taking differently: Take 40 mg by mouth daily.   Jerrol Banana., MD Taking Active   pyridoxine (B-6) 100 MG tablet [id number] Yes Take 100 mg by mouth daily. [provider] Taking Active Self  sertraline (ZOLOFT) 100 MG tablet [id number] Yes Take 0.5 tablets (50 mg total) by mouth daily. Doctor increased dose to 132m 05/2020  but patient reports that she never increased the dose. She continued taking 542mdaily.  Patient taking differently: Take 100 mg by mouth daily.   GiJerrol Banana MD Taking Active Self  vitamin C (ASCORBIC ACID) 500 MG tablet [id number] Take 500 mg by mouth daily. [provider] Taking Active Self  vitamin E 400 UNIT capsule [id number] Take 400 Units by mouth daily. [provider] Taking Active Self            Patient Active Problem List   Diagnosis Date Noted   Type 2 diabetes mellitus with complication, without long-term current use of insulin (HCM[place]05/13/2022   Severe sepsis (HCLiber[place]0445/62/5638 Complicated UTI (urinary tract infection) 11/05/2020   Hydronephrosis with renal and ureteral calculus obstruction 11/05/2020   History of breast cancer 11/05/2020   History of CVA (cerebrovascular accident) 11/05/2020   AKI  (acute kidney injury) (HCC[place]04/08/2020   Hemiparesis affecting left side as late effect of stroke (HCC[place]04/08/2020   Postural dizziness with presyncope 11/05/2020   Stroke (HCR[place]01/31/2022   Leukocytes in urine 06/09/2020  Grief 06/09/2020   Episode of recurrent major depressive disorder ([place]) 06/09/2020   Urinary symptom or sign 06/09/2020   Left sided numbness 11/06/2018   Fat necrosis of breast 62/95/2841   Umbilical pain 32/44/0102   Breast mass, left 04/07/2016   Controlled type 2 diabetes mellitus without complication ([place]) 72/53/6644   Allergic rhinitis 03/30/2015   Anxiety 03/30/2015   Carotid arterial disease ([place]) 03/30/2015   Diabetes mellitus, type 2 ([place]) 03/30/2015   Essential (primary) hypertension 03/30/2015   Hypercholesteremia 03/30/2015   Left leg pain 03/30/2015   LBP (low back pain) 03/30/2015   Neuropathic pain 03/30/2015   Burning or prickling sensation 03/30/2015   Neuralgia neuritis, sciatic nerve 03/30/2015   Skin cyst 11/24/2014   Carcinoma of upper-outer quadrant of right breast in female, estrogen receptor positive ([place]) 08/17/2011   Allergic reaction 11/29/2009   Adaptation reaction 04/30/2009   Acid reflux 04/30/2009   Stricture and stenosis of cervix 01/28/2009    Immunization History  Administered Date(s) Administered   Fluad Quad(high Dose 65+) 06/24/2019, 05/03/2020   Influenza, High Dose Seasonal PF 05/13/2014, 05/29/2015, 04/05/2016, 04/19/2017, 04/11/2018   Influenza,inj,Quad PF,6+ Mos 05/23/2013   PFIZER(Purple Top)SARS-COV-2 Vaccination 10/17/2019, 11/11/2019, 07/07/2020   Pneumococcal Conjugate-13 05/13/2014   Pneumococcal Polysaccharide-23 06/07/2012   Td 04/19/2007, 05/05/2011   Tdap 05/05/2011   Zoster, Live 04/27/2008    Conditions to be addressed/monitored:  Hypertension, Hyperlipidemia, Diabetes, and Coronary Artery Disease  Care Plan : General Pharmacy (Adult)  Updates made by Germaine Pomfret, RPH since 03/14/2021  12:00 AM     Problem: Hypertension, Hyperlipidemia, Diabetes, and Coronary Artery Disease   Priority: High     Long-Range Goal: Patient-Specific Goal   Start Date: 02/21/2021  Expected End Date: 03/14/2022  This Visit's Progress: On track  Priority: High  Note:   Current Barriers:  No barriers noted  Pharmacist Clinical Goal(s):  Patient will maintain control of blood pressure as evidenced by BP less than 140/90  through collaboration with PharmD and provider.   Interventions: 1:1 collaboration with Jerrol Banana., MD regarding development and update of comprehensive plan of care as evidenced by provider attestation and co-signature Inter-disciplinary care team collaboration (see longitudinal plan of care) Comprehensive medication review performed; medication list updated in electronic medical record  Hypertension (BP goal <140/90) -Controlled -Current treatment: Chlorthalidone 25 mg daily  Losartan 50 mg daily  Metoprolol tartrate 25 mg 1/2 tablet twice daily  -Medications previously tried: NA  -Current home readings:   Left arm Right arm  13-Jul 120/76, Pulse 77 177/66, Pulse 74  14-Jul 142/94, Pulse 76   15-Jul 116/87, Pulse 88    16-Jul    17-Jul    18-Jul 140/84, Pulse 75    -Current dietary habits: NA -Current exercise habits: Walking, arm / legs exercises twice daily.  -Denies hypotensive/hypertensive symptoms -Educated on Daily salt intake goal < 2300 mg; -Counseled to monitor BP at home weekly, document, and provide log at future appointments -Recommended to continue current medication  Hyperlipidemia: (LDL goal < 70) -Controlled -Current treatment: Atorvastatin 40 mg daily -Medications previously tried: NA -Recommended to continue current medication  Diabetes (A1c goal <8%) -Controlled -Current medications: Metformin 1000 mg twice daily  -Medications previously tried: NA  -Current home glucose readings fasting glucose: 154, 134, 140, 142,   -Denies hypoglycemic/hyperglycemic symptoms -Recommended to continue current medication  Patient Goals/Self-Care Activities Patient will:  - check glucose daily before breakfast, document, and provide at future appointments check blood pressure weekly,  document, and provide at future appointments  Follow Up Plan: Telephone follow up appointment with care management team member scheduled for:  05/20/2021 at 1:00 PM      Medication Assistance: None required.  Patient affirms current coverage meets needs.  Compliance/Adherence/Medication fill history: Care Gaps: Hepatitis C  Shingrix Foot Exam Ophthalmology Exam  Covid Booster Influenza  Star-Rating Drugs: Atorvastatin 40 mg daily: LF 12/29/20 for 90-DS Losartan 50 mg daily: LF 12/05/20 for 90-DS Metformin 1000 mg: LF 01/19/21 for 90-DS   Patient's preferred pharmacy is:  [facility] - [place], [place] [address] Phone: [phone] Fax: [phone]  [facility] [address]), [place] - [place]) [place] [address] Phone: [phone] Fax: [phone]  Uses pill box? Yes Pt endorses 100% compliance  We discussed: Current pharmacy is preferred with insurance plan and patient is satisfied with pharmacy services Patient decided to: Continue current medication management strategy  Care Plan and Follow Up Patient Decision:  Patient agrees to Care Plan and Follow-up.  Plan: Telephone follow up appointment with care management team member scheduled for:  05/20/2021 at 1:00 PM  [name], PharmD, [place], [place] [phone]

## 2021-03-02 ENCOUNTER — Other Ambulatory Visit: Payer: Self-pay | Admitting: Family Medicine

## 2021-03-14 NOTE — Patient Instructions (Signed)
Visit Information It was great speaking with you today!  Please let me know if you have any questions about our visit.   Goals Addressed             This Visit's Progress    Monitor and Manage My Blood Sugar-Diabetes Type 2       Timeframe:  Long-Range Goal Priority:  High Start Date:  02/21/2021                            Expected End Date:  02/21/2022                     Follow Up Date 05/20/2021    - check blood sugar at prescribed times - check blood sugar if I feel it is too high or too low - enter blood sugar readings and medication or insulin into daily log    Why is this important?   Checking your blood sugar at home helps to keep it from getting very high or very low.  Writing the results in a diary or log helps the doctor know how to care for you.  Your blood sugar log should have the time, date and the results.  Also, write down the amount of insulin or other medicine that you take.  Other information, like what you ate, exercise done and how you were feeling, will also be helpful.     Notes:         Patient Care Plan: General Pharmacy (Adult)     Problem Identified: Hypertension, Hyperlipidemia, Diabetes, and Coronary Artery Disease   Priority: High     Long-Range Goal: Patient-Specific Goal   Start Date: 02/21/2021  Expected End Date: 03/14/2022  This Visit's Progress: On track  Priority: High  Note:   Current Barriers:  No barriers noted  Pharmacist Clinical Goal(s):  Patient will maintain control of blood pressure as evidenced by BP less than 140/90  through collaboration with PharmD and provider.   Interventions: 1:1 collaboration with Jerrol Banana., MD regarding development and update of comprehensive plan of care as evidenced by provider attestation and co-signature Inter-disciplinary care team collaboration (see longitudinal plan of care) Comprehensive medication review performed; medication list updated in electronic medical  record  Hypertension (BP goal <140/90) -Controlled -Current treatment: Chlorthalidone 25 mg daily  Losartan 50 mg daily  Metoprolol tartrate 25 mg 1/2 tablet twice daily  -Medications previously tried: NA  -Current home readings:   Left arm Right arm  13-Jul 120/76, Pulse 77 177/66, Pulse 74  14-Jul 142/94, Pulse 76   15-Jul 116/87, Pulse 88    16-Jul    17-Jul    18-Jul 140/84, Pulse 75    -Current dietary habits: NA -Current exercise habits: Walking, arm / legs exercises twice daily.  -Denies hypotensive/hypertensive symptoms -Educated on Daily salt intake goal < 2300 mg; -Counseled to monitor BP at home weekly, document, and provide log at future appointments -Recommended to continue current medication  Hyperlipidemia: (LDL goal < 70) -Controlled -Current treatment: Atorvastatin 40 mg daily -Medications previously tried: NA -Recommended to continue current medication  Diabetes (A1c goal <8%) -Controlled -Current medications: Metformin 1000 mg twice daily  -Medications previously tried: NA  -Current home glucose readings fasting glucose: 154, 134, 140, 142,  -Denies hypoglycemic/hyperglycemic symptoms -Recommended to continue current medication  Patient Goals/Self-Care Activities Patient will:  - check glucose daily before breakfast, document, and provide at future appointments  check blood pressure weekly, document, and provide at future appointments  Follow Up Plan: Telephone follow up appointment with care management team member scheduled for:  05/20/2021 at 1:00 PM      Patient agreed to services and verbal consent obtained.   Patient verbalizes understanding of instructions provided today and agrees to view in [place].   [name], PharmD, [facility] [place], [place] [phone]

## 2021-03-21 ENCOUNTER — Ambulatory Visit
Admission: RE | Admit: 2021-03-21 | Discharge: 2021-03-21 | Disposition: A | Discharge status: Home / Self Care | Payer: Medicare HMO | Source: Ambulatory Visit | Attending: General Surgery | Admitting: General Surgery

## 2021-03-21 ENCOUNTER — Other Ambulatory Visit: Payer: Self-pay

## 2021-03-21 DIAGNOSIS — Z853 Personal history of malignant neoplasm of breast: Secondary | ICD-10-CM | POA: Diagnosis not present

## 2021-03-21 DIAGNOSIS — Z1231 Encounter for screening mammogram for malignant neoplasm of breast: Secondary | ICD-10-CM | POA: Diagnosis not present

## 2021-03-31 DIAGNOSIS — Z853 Personal history of malignant neoplasm of breast: Secondary | ICD-10-CM | POA: Diagnosis not present

## 2021-04-05 ENCOUNTER — Other Ambulatory Visit: Payer: Self-pay | Admitting: Family Medicine

## 2021-04-12 ENCOUNTER — Other Ambulatory Visit: Payer: Self-pay

## 2021-04-12 ENCOUNTER — Ambulatory Visit (INDEPENDENT_AMBULATORY_CARE_PROVIDER_SITE_OTHER): Payer: Medicare HMO | Admitting: Family Medicine

## 2021-04-12 ENCOUNTER — Encounter: Payer: Self-pay | Admitting: Family Medicine

## 2021-04-12 VITALS — BP 133/90 | HR 96 | Temp 98.5°F | Resp 16 | Ht 66.0 in | Wt 192.0 lb

## 2021-04-12 DIAGNOSIS — I10 Essential (primary) hypertension: Secondary | ICD-10-CM

## 2021-04-12 DIAGNOSIS — F4321 Adjustment disorder with depressed mood: Secondary | ICD-10-CM

## 2021-04-12 DIAGNOSIS — I639 Cerebral infarction, unspecified: Secondary | ICD-10-CM

## 2021-04-12 DIAGNOSIS — E118 Type 2 diabetes mellitus with unspecified complications: Secondary | ICD-10-CM | POA: Diagnosis not present

## 2021-04-12 DIAGNOSIS — E785 Hyperlipidemia, unspecified: Secondary | ICD-10-CM

## 2021-04-12 DIAGNOSIS — E1169 Type 2 diabetes mellitus with other specified complication: Secondary | ICD-10-CM | POA: Diagnosis not present

## 2021-04-12 DIAGNOSIS — I779 Disorder of arteries and arterioles, unspecified: Secondary | ICD-10-CM

## 2021-04-12 DIAGNOSIS — F4323 Adjustment disorder with mixed anxiety and depressed mood: Secondary | ICD-10-CM | POA: Diagnosis not present

## 2021-04-12 DIAGNOSIS — C50411 Malignant neoplasm of upper-outer quadrant of right female breast: Secondary | ICD-10-CM | POA: Diagnosis not present

## 2021-04-12 DIAGNOSIS — Z170 Estrogen receptor positive status [ER+]: Secondary | ICD-10-CM

## 2021-04-12 LAB — POCT GLYCOSYLATED HEMOGLOBIN (HGB A1C): Hemoglobin A1C: 7.2 % — AB (ref 4.0–5.6)

## 2021-04-12 NOTE — Progress Notes (Signed)
Established patient visit   Patient: Leah Schaefer   DOB: 03-Jul-1941   80 y.o. Female  MRN: [id number] Visit Date: 04/12/2021  Today's healthcare provider: [name], MD   Chief Complaint  Patient presents with   Diabetes   Hypertension   Hyperlipidemia   Depression   Subjective  -------------------------------------------------------------------------------------------------------------------- HPI  Patient comes in today for follow-up.  Her diabetes has been well controlled with a hemoglobin A1c of 7.2 today. Daughter is with her as the patient remains upset that she has not been given permission to drive since her stroke.  She has appoint with neurology next week.  From a neurologic standpoint she appears to be stable at this time.  All risk factors are treated.  PHQ-9 today is 17.  He is mainly upset about not driving.  He is also upset about the death of her husband last year. Diabetes Mellitus Type II, follow-up  Lab Results  Component Value Date   HGBA1C 7.2 (A) 04/12/2021   HGBA1C 7.5 (A) 12/13/2020   HGBA1C 7.6 (H) 09/07/2020   Last seen for diabetes 4 months ago.  Management since then includes continuing the same treatment. She reports good compliance with treatment. She is not having side effects.   Home blood sugar records: fasting range: 140s  Episodes of hypoglycemia? No    Current insulin regiment: none Most Recent Eye Exam: due  --------------------------------------------------------------------------------------------------- Hypertension, follow-up  BP Readings from Last 3 Encounters:  04/12/21 133/90  01/19/21 128/85  01/12/21 135/78   Wt Readings from Last 3 Encounters:  04/12/21 192 lb (87.1 kg)  01/19/21 185 lb 14.4 oz (84.3 kg)  01/12/21 190 lb (86.2 kg)     She was last seen for hypertension 4 months ago.  BP at that visit was 128/85. Management since that visit includes no medication. She reports good compliance with  treatment. She is not having side effects.  She is exercising. She is adherent to low salt diet.   Outside blood pressures are checked daily. Reports 120s/80s.   She does not smoke.  Use of agents associated with hypertension: none.   --------------------------------------------------------------------------------------------------- Lipid/Cholesterol, follow-up  Last Lipid Panel: Lab Results  Component Value Date   CHOL 105 09/07/2020   LDLCALC 57 09/07/2020   HDL 30 (L) 09/07/2020   TRIG 89 09/07/2020    She was last seen for this 4 months ago.  Management since that visit includes no changes.  She reports good compliance with treatment. She is not having side effects.   Last metabolic panel Lab Results  Component Value Date   GLUCOSE 141 (H) 01/19/2021   NA 137 01/19/2021   K 3.5 01/19/2021   BUN 19 01/19/2021   CREATININE 0.84 01/19/2021   GFRNONAA >60 01/19/2021   GFRAA 83 05/31/2020   CALCIUM 10.0 01/19/2021   AST 16 01/19/2021   ALT 16 01/19/2021   The ASCVD Risk score (Goff DC Jr., et al., 2013) failed to calculate for the following reasons:   The patient has a prior MI or stroke diagnosis  Depression, Follow-up  She  was last seen for this 4 months ago. Changes made at last visit include adding Wellbutrin '150mg'$  daily.    She reports excellent compliance with treatment. She is not having side effects.   She reports good tolerance of treatment. She feels she is Unchanged since last visit.  Depression screen [facility] 2/9 04/12/2021 12/13/2020 07/07/2020  Decreased Interest '2 1 1  '$ Down, Depressed, Hopeless  $'2 1 1  'j$ PHQ - 2 Score '4 2 2  '$ Altered sleeping 3 0 0  Tired, decreased energy '2 1 1  '$ Change in appetite 2 0 0  Feeling bad or failure about yourself  2 0 0  Trouble concentrating 3 0 0  Moving slowly or fidgety/restless 1 0 0  Suicidal thoughts 0 0 0  PHQ-9 Score '17 3 3  '$ Difficult doing work/chores Somewhat difficult Not difficult at all Not difficult at  all  Some recent data might be hidden       Medications: Outpatient Medications Prior to Visit  Medication Sig   ACCU-CHEK AVIVA PLUS test strip CHECK  FASTING  BLOOD  GLUCOSE EVERY DAY   Accu-Chek Softclix Lancets lancets TEST BLOOD SUGAR EVERY DAY   acetaminophen (TYLENOL) 500 MG tablet Take 500 mg by mouth every 6 (six) hours as needed for mild pain or headache.    atorvastatin (LIPITOR) 40 MG tablet TAKE 1 TABLET EVERY EVENING (Patient taking differently: Take 40 mg by mouth every evening.)   buPROPion (WELLBUTRIN XL) 150 MG 24 hr tablet Take 1 tablet (150 mg total) by mouth daily.   Calcium Carb-Cholecalciferol 600-800 MG-UNIT TABS Take 1 tablet by mouth daily.   chlorthalidone (HYGROTON) 25 MG tablet Take 1 tablet (25 mg total) by mouth daily.   cholecalciferol (VITAMIN D) 1000 units tablet Take 1,000 Units by mouth daily.   clopidogrel (PLAVIX) 75 MG tablet TAKE 1 TABLET EVERY DAY   cyanocobalamin 500 MCG tablet Take 500 mcg by mouth daily.   gabapentin (NEURONTIN) 300 MG capsule TAKE 2 CAPSULES AT BEDTIME (Patient taking differently: Take 600 mg by mouth at bedtime.)   latanoprost (XALATAN) 0.005 % ophthalmic solution Place 1 drop into both eyes at bedtime.    letrozole (FEMARA) 2.5 MG tablet TAKE 1 TABLET (2.5 MG TOTAL) BY MOUTH DAILY.   losartan (COZAAR) 50 MG tablet TAKE 1 TABLET (50 MG TOTAL) BY MOUTH DAILY.   meclizine (ANTIVERT) 25 MG tablet Take 0.5 tablets (12.5 mg total) by mouth 3 (three) times daily as needed for dizziness.   meloxicam (MOBIC) 15 MG tablet TAKE 1 TABLET EVERY DAY   metFORMIN (GLUCOPHAGE) 1000 MG tablet TAKE 1 TABLET TWICE DAILY  WITH  A  MEAL   metoprolol tartrate (LOPRESSOR) 25 MG tablet TAKE 1/2 TABLET (12.5 MG TOTAL) TWICE DAILY (Patient taking differently: Take 12.5 mg by mouth 2 (two) times daily.)   Multiple Vitamin (MULTIVITAMIN WITH MINERALS) TABS tablet Take 1 tablet by mouth daily.   omega-3 acid ethyl esters (LOVAZA) 1 g capsule Take 1 g by  mouth daily.    pantoprazole (PROTONIX) 40 MG tablet TAKE 1 TABLET TWICE DAILY   pyridoxine (B-6) 100 MG tablet Take 100 mg by mouth daily.   sertraline (ZOLOFT) 100 MG tablet TAKE 1/2 TABLET EVERY DAY   vitamin C (ASCORBIC ACID) 500 MG tablet Take 500 mg by mouth daily.   vitamin E 400 UNIT capsule Take 400 Units by mouth daily.   No facility-administered medications prior to visit.    Review of Systems  Constitutional:  Negative for activity change and fatigue.  Respiratory:  Negative for cough and shortness of breath.   Cardiovascular:  Negative for chest pain, palpitations and leg swelling.  Endocrine: Negative for cold intolerance, heat intolerance, polydipsia, polyphagia and polyuria.  Neurological:  Negative for dizziness and facial asymmetry.  Psychiatric/Behavioral:  Negative for agitation. The patient is not nervous/anxious.        Objective  --------------------------------------------------------------------------------------------------------------------  BP 133/90   Pulse 96   Temp 98.5 F (36.9 C)   Resp 16   Ht '5\' 6"'$  (1.676 m)   Wt 192 lb (87.1 kg)   BMI 30.99 kg/m  BP Readings from Last 3 Encounters:  04/12/21 133/90  01/19/21 128/85  01/12/21 135/78   Wt Readings from Last 3 Encounters:  04/12/21 192 lb (87.1 kg)  01/19/21 185 lb 14.4 oz (84.3 kg)  01/12/21 190 lb (86.2 kg)      Physical Exam Vitals reviewed.  Constitutional:      Appearance: She is well-developed. She is obese.  HENT:     Head: Normocephalic and atraumatic.     Right Ear: External ear normal.     Left Ear: External ear normal.     Nose: Nose normal.  Eyes:     General: No scleral icterus. Neck:     Thyroid: No thyromegaly.  Cardiovascular:     Rate and Rhythm: Normal rate and regular rhythm.     Heart sounds: Normal heart sounds.  Pulmonary:     Effort: Pulmonary effort is normal.     Breath sounds: Normal breath sounds.  Abdominal:     Palpations: Abdomen is soft.   Skin:    General: Skin is warm and dry.  Neurological:     Mental Status: She is alert and oriented to person, place, and time. Mental status is at baseline.  Psychiatric:        Mood and Affect: Mood normal.        Behavior: Behavior normal.        Thought Content: Thought content normal.        Judgment: Judgment normal.      Results for orders placed or performed in visit on 04/12/21  POCT glycosylated hemoglobin (Hb A1C)  Result Value Ref Range   Hemoglobin A1C 7.2 (A) 4.0 - 5.6 %   HbA1c POC (<> result, manual entry)     HbA1c, POC (prediabetic range)     HbA1c, POC (controlled diabetic range)      Assessment & Plan  ---------------------------------------------------------------------------------------------------------------------- 1. Type 2 diabetes mellitus with complication, without long-term current use of insulin (HCC) A1c under good control at 7.7% on metformin - POCT glycosylated hemoglobin (Hb A1C)  2. Cerebrovascular accident (CVA), unspecified mechanism ([place]) All risk factors treated.  On Plavix She has appoint with neurology next week.  3. Hyperlipidemia associated with type 2 diabetes mellitus ([place]) On atorvastatin 40  4. Carotid artery disease, unspecified laterality, unspecified type ([place]) Treated  5. Essential (primary) hypertension On losartan and metoprolol and chlorthalidone  6. Carcinoma of upper-outer quadrant of right breast in female, estrogen receptor positive ([place]) Breast cancer treated with Femara  7. Grief Adjusting to the death of her husband.  8. Adjustment disorder with mixed anxiety and depressed mood PHQ-9 on next visit.  Continue Wellbutrin and sertraline   No follow-ups on file.      I, [name], MD, have reviewed all documentation for this visit. The documentation on 04/15/21 for the exam, diagnosis, procedures, and orders are all accurate and complete.    Ashtin Rosner Cranford Mon, MD  [facility] [phone] (phone) [phone] (fax)  [place]

## 2021-04-15 DIAGNOSIS — G3184 Mild cognitive impairment, so stated: Secondary | ICD-10-CM | POA: Diagnosis not present

## 2021-04-15 DIAGNOSIS — F0390 Unspecified dementia without behavioral disturbance: Secondary | ICD-10-CM | POA: Diagnosis not present

## 2021-04-15 DIAGNOSIS — F4321 Adjustment disorder with depressed mood: Secondary | ICD-10-CM | POA: Diagnosis not present

## 2021-04-15 DIAGNOSIS — I6389 Other cerebral infarction: Secondary | ICD-10-CM | POA: Diagnosis not present

## 2021-04-15 DIAGNOSIS — I6930 Unspecified sequelae of cerebral infarction: Secondary | ICD-10-CM | POA: Diagnosis not present

## 2021-04-15 DIAGNOSIS — G939 Disorder of brain, unspecified: Secondary | ICD-10-CM | POA: Diagnosis not present

## 2021-04-21 ENCOUNTER — Telehealth: Payer: Self-pay

## 2021-04-21 NOTE — Progress Notes (Signed)
Chronic Care Management Pharmacy Assistant   Name: Leah Schaefer  MRN: [id number] DOB: [birth year]  Reason for Encounter: Hypertension Disease State Call   Recent office visits:  04/12/2021 Leah Aschoff, MD (PCP Office Visit) for DM & HTN- No medication changes noted; lab ordered, no follow-up noted  Recent consult visits:  04/15/2021 Leah Leeanne Deed, MD (Neurology) for Memory loss- No medication changes noted  03/31/2021 Leah Siren, MD (General Surgery) for Follow-up- No medication changes noted; patient instructed to follow-up in one year  Hospital visits:  None in previous 6 months  Medications: Outpatient Encounter Medications as of 04/21/2021  Medication Sig   ACCU-CHEK AVIVA PLUS test strip CHECK  FASTING  BLOOD  GLUCOSE EVERY DAY   Accu-Chek Softclix Lancets lancets TEST BLOOD SUGAR EVERY DAY   acetaminophen (TYLENOL) 500 MG tablet Take 500 mg by mouth every 6 (six) hours as needed for mild pain or headache.    atorvastatin (LIPITOR) 40 MG tablet TAKE 1 TABLET EVERY EVENING (Patient taking differently: Take 40 mg by mouth every evening.)   buPROPion (WELLBUTRIN XL) 150 MG 24 hr tablet Take 1 tablet (150 mg total) by mouth daily.   Calcium Carb-Cholecalciferol 600-800 MG-UNIT TABS Take 1 tablet by mouth daily.   chlorthalidone (HYGROTON) 25 MG tablet Take 1 tablet (25 mg total) by mouth daily.   cholecalciferol (VITAMIN D) 1000 units tablet Take 1,000 Units by mouth daily.   clopidogrel (PLAVIX) 75 MG tablet TAKE 1 TABLET EVERY DAY   cyanocobalamin 500 MCG tablet Take 500 mcg by mouth daily.   gabapentin (NEURONTIN) 300 MG capsule TAKE 2 CAPSULES AT BEDTIME (Patient taking differently: Take 600 mg by mouth at bedtime.)   latanoprost (XALATAN) 0.005 % ophthalmic solution Place 1 drop into both eyes at bedtime.    letrozole (FEMARA) 2.5 MG tablet TAKE 1 TABLET (2.5 MG TOTAL) BY MOUTH DAILY.   losartan (COZAAR) 50 MG tablet TAKE 1 TABLET (50 MG TOTAL)  BY MOUTH DAILY.   meclizine (ANTIVERT) 25 MG tablet Take 0.5 tablets (12.5 mg total) by mouth 3 (three) times daily as needed for dizziness.   meloxicam (MOBIC) 15 MG tablet TAKE 1 TABLET EVERY DAY   metFORMIN (GLUCOPHAGE) 1000 MG tablet TAKE 1 TABLET TWICE DAILY  WITH  A  MEAL   metoprolol tartrate (LOPRESSOR) 25 MG tablet TAKE 1/2 TABLET (12.5 MG TOTAL) TWICE DAILY (Patient taking differently: Take 12.5 mg by mouth 2 (two) times daily.)   Multiple Vitamin (MULTIVITAMIN WITH MINERALS) TABS tablet Take 1 tablet by mouth daily.   omega-3 acid ethyl esters (LOVAZA) 1 g capsule Take 1 g by mouth daily.    pantoprazole (PROTONIX) 40 MG tablet TAKE 1 TABLET TWICE DAILY   pyridoxine (B-6) 100 MG tablet Take 100 mg by mouth daily.   sertraline (ZOLOFT) 100 MG tablet TAKE 1/2 TABLET EVERY DAY   vitamin C (ASCORBIC ACID) 500 MG tablet Take 500 mg by mouth daily.   vitamin E 400 UNIT capsule Take 400 Units by mouth daily.   No facility-administered encounter medications on file as of 04/21/2021.   Care Gaps: Hepatitis C screening Zoster Vaccines Diabetic Foot Exam Diabetic Eye Exam COVID-19 Vaccine Booster 4 Influenza Vaccine (last completed 05/03/2020)  Star Rating Drugs: Metformin 1000 mg last filled on 01/19/2021 for a 90-Day supply with no Pharmacy noted Losartan 50 mg last filled on 02/22/2021 for a 90-Day supply with No Pharmacy noted Atorvastatin 40 mg last filled on 03/11/2021 for a 90-Day supply  with No pharmacy noted  Reviewed chart prior to disease state call. Spoke with patient regarding BP  Recent Office Vitals: BP Readings from Last 3 Encounters:  04/12/21 133/90  01/19/21 128/85  01/12/21 135/78   Pulse Readings from Last 3 Encounters:  04/12/21 96  01/19/21 73  01/12/21 94    Wt Readings from Last 3 Encounters:  04/12/21 192 lb (87.1 kg)  01/19/21 185 lb 14.4 oz (84.3 kg)  01/12/21 190 lb (86.2 kg)     Kidney Function Lab Results  Component Value Date/Time    CREATININE 0.84 01/19/2021 09:09 AM   CREATININE 1.00 11/12/2020 01:40 PM   CREATININE 0.92 09/04/2014 09:16 AM   CREATININE 0.89 02/18/2014 10:48 AM   GFRNONAA >60 01/19/2021 09:09 AM   GFRNONAA >60 09/04/2014 09:16 AM   GFRNONAA >60 02/18/2014 10:48 AM   GFRAA 83 05/31/2020 02:30 PM   GFRAA >60 09/04/2014 09:16 AM   GFRAA >60 02/18/2014 10:48 AM    BMP Latest Ref Rng & Units 01/19/2021 11/12/2020 11/08/2020  Glucose 70 - 99 mg/dL 141(H) 102(H) 162(H)  BUN 8 - 23 mg/dL '19 11 14  '$ Creatinine 0.44 - 1.00 mg/dL 0.84 1.00 0.88  BUN/Creat Ratio 12 - 28 - 11(L) -  Sodium 135 - 145 mmol/L 137 140 137  Potassium 3.5 - 5.1 mmol/L 3.5 4.0 3.7  Chloride 98 - 111 mmol/L 100 98 104  CO2 22 - 32 mmol/L '27 22 26  '$ Calcium 8.9 - 10.3 mg/dL 10.0 10.0 8.9    Current antihypertensive regimen:  Chlorthalidone 25 mg daily Metoprolol Tartrate 25 mg take 12.5 mg twice daily Losartan 50 mg 1 tablet daily  How often are you checking your Blood Pressure? daily  Current home BP readings: 09/19 125/77 pulse 82  What recent interventions/DTPs have been made by any provider to improve Blood Pressure control since last CPP Visit: None ID  Any recent hospitalizations or ED visits since last visit with CPP? No  What diet changes have been made to improve Blood Pressure Control?  Patient reports that she is not on any particular diet but she does not eat as much as she used to her portions are smaller.   What exercise is being done to improve your Blood Pressure Control?  Patient reports that she does not exercise regularly but she does move around and do a little walking when she can. She can ambulate on her on but she has a cane and walker for assistance if needed. She reports that she does stumble sometimes but denies any recent falls.   Adherence Review: Is the patient currently on ACE/ARB medication? Yes Does the patient have >5 day gap between last estimated fill dates? No  Patient reports that she is  doing well over all, and has no complaints. She states that she is able to afford her medications with no barriers, and she denies needing any refills today.   Patient has a future appointment with Leah Schaefer, CPP on 05/20/2021 '@1300'$     [name], CPA/CMA Clinical Pharmacist Assistant Phone: [phone]

## 2021-04-25 ENCOUNTER — Other Ambulatory Visit: Payer: Self-pay | Admitting: Family Medicine

## 2021-04-25 DIAGNOSIS — I10 Essential (primary) hypertension: Secondary | ICD-10-CM

## 2021-05-04 ENCOUNTER — Telehealth: Payer: Self-pay

## 2021-05-04 NOTE — Progress Notes (Signed)
Per clinical Pharmacist, Please reschedule patient office appointment on 05/20/2021.Patient reschedule/change her appointment to 06/20/2021 at 1:30 pm for a telephone CCM follow up.  Patient denies any issue/side effects since speaking with [name], CPA/CMA earlier this month.  [place] Pharmacist Assistant [phone]

## 2021-05-11 ENCOUNTER — Other Ambulatory Visit: Payer: Self-pay

## 2021-05-11 ENCOUNTER — Ambulatory Visit (INDEPENDENT_AMBULATORY_CARE_PROVIDER_SITE_OTHER): Payer: Medicare HMO

## 2021-05-11 ENCOUNTER — Other Ambulatory Visit: Payer: Self-pay | Admitting: Family Medicine

## 2021-05-11 DIAGNOSIS — Z23 Encounter for immunization: Secondary | ICD-10-CM | POA: Diagnosis not present

## 2021-05-11 DIAGNOSIS — I10 Essential (primary) hypertension: Secondary | ICD-10-CM

## 2021-05-11 NOTE — Telephone Encounter (Signed)
Future OV 08/15/21  Approved per protocol. Requested Prescriptions  Pending Prescriptions Disp Refills  . atorvastatin (LIPITOR) 40 MG tablet [Pharmacy Med Name: ATORVASTATIN CALCIUM 40 MG Tablet] 90 tablet 0    Sig: TAKE 1 TABLET EVERY EVENING     Cardiovascular:  Antilipid - Statins Failed - 05/11/2021 11:13 AM      Failed - HDL in normal range and within 360 days    HDL  Date Value Ref Range Status  09/07/2020 30 (L) >40 mg/dL Final  01/21/2020 37 (L) >39 mg/dL Final         Passed - Total Cholesterol in normal range and within 360 days    Cholesterol, Total  Date Value Ref Range Status  01/21/2020 111 100 - 199 mg/dL Final   Cholesterol  Date Value Ref Range Status  09/07/2020 105 0 - 200 mg/dL Final         Passed - LDL in normal range and within 360 days    LDL Chol Calc (NIH)  Date Value Ref Range Status  01/21/2020 59 0 - 99 mg/dL Final   LDL Cholesterol  Date Value Ref Range Status  09/07/2020 57 0 - 99 mg/dL Final    Comment:           Total Cholesterol/HDL:CHD Risk Coronary Heart Disease Risk Table                     Men   Women  1/2 Average Risk   3.4   3.3  Average Risk       5.0   4.4  2 X Average Risk   9.6   7.1  3 X Average Risk  23.4   11.0        Use the calculated Patient Ratio above and the CHD Risk Table to determine the patient's CHD Risk.        ATP III CLASSIFICATION (LDL):  <100     mg/dL   Optimal  100-129  mg/dL   Near or Above                    Optimal  130-159  mg/dL   Borderline  160-189  mg/dL   High  >190     mg/dL   Very High Performed at [facility], [address] [place], [place] [address]          Passed - Triglycerides in normal range and within 360 days    Triglycerides  Date Value Ref Range Status  09/07/2020 89 <150 mg/dL Final         Passed - Patient is not pregnant      Passed - Valid encounter within last 12 months    Recent Outpatient Visits          4 weeks ago Type 2 diabetes mellitus with  complication, without long-term current use of insulin [facility])   [facility] Jerrol Banana., MD   4 months ago Type 2 diabetes mellitus with complication, without long-term current use of insulin [facility])   [facility] Jerrol Banana., MD   6 months ago Urinary tract infection without hematuria, site unspecified   [facility] Flinchum, Kelby Aline, FNP   8 months ago Cerebrovascular accident (CVA), unspecified mechanism [facility])   [facility] Jerrol Banana., MD   10 months ago Controlled type 2 diabetes mellitus without complication, without long-term current use of insulin ([place])  [facility] Rosanna Randy, Retia Passe., MD      Future Appointments            In 2 months Stoioff, Ronda [place]ly, MD [place]   In 3 months Jerrol Banana., MD [facility]           . metoprolol tartrate (LOPRESSOR) 25 MG tablet [Pharmacy Med Name: METOPROLOL TARTRATE 25 MG Tablet] 90 tablet 0    Sig: TAKE 1/2 TABLET TWICE DAILY     Cardiovascular:  Beta Blockers Failed - 05/11/2021 11:13 AM      Failed - Last BP in normal range    BP Readings from Last 1 Encounters:  04/12/21 133/90         Passed - Last Heart Rate in normal range    Pulse Readings from Last 1 Encounters:  04/12/21 96         Passed - Valid encounter within last 6 months    Recent Outpatient Visits          4 weeks ago Type 2 diabetes mellitus with complication, without long-term current use of insulin ([place])   [facility] Jerrol Banana., MD   4 months ago Type 2 diabetes mellitus with complication, without long-term current use of insulin [facility])   [facility] Jerrol Banana., MD   6 months ago Urinary tract infection without hematuria, site unspecified   [facility] Flinchum, Kelby Aline, FNP   8 months ago Cerebrovascular accident (CVA),  unspecified mechanism [facility])   [facility] Jerrol Banana., MD   10 months ago Controlled type 2 diabetes mellitus without complication, without long-term current use of insulin [facility])   [facility] Jerrol Banana., MD      Future Appointments            In 2 months Stoioff, Ronda Fairly, MD [place]   In 3 months Jerrol Banana., MD [facility] [place]

## 2021-05-20 ENCOUNTER — Ambulatory Visit: Payer: Self-pay

## 2021-06-09 ENCOUNTER — Other Ambulatory Visit: Payer: Self-pay | Admitting: Family Medicine

## 2021-06-09 DIAGNOSIS — E119 Type 2 diabetes mellitus without complications: Secondary | ICD-10-CM

## 2021-06-09 NOTE — Telephone Encounter (Signed)
Requested Prescriptions  Pending Prescriptions Disp Refills  . metFORMIN (GLUCOPHAGE) 1000 MG tablet [Pharmacy Med Name: METFORMIN HYDROCHLORIDE 1000 MG Tablet] 180 tablet 0    Sig: TAKE 1 TABLET TWICE DAILY  WITH  A  MEAL     Endocrinology:  Diabetes - Biguanides Failed - 06/09/2021  1:20 PM      Failed - AA eGFR in normal range and within 360 days    EGFR (African American)  Date Value Ref Range Status  09/04/2014 >60 >79m/min Final  02/18/2014 >60  Final   GFR calc Af Amer  Date Value Ref Range Status  05/31/2020 83 >59 mL/min/1.73 Final    Comment:    **In accordance with recommendations from the NKF-ASN Task force,**   [facility] is in the process of updating its eGFR calculation to the   2021 CKD-EPI creatinine equation that estimates kidney function   without a race variable.    EGFR (Non-African Amer.)  Date Value Ref Range Status  09/04/2014 >60 >666mmin Final    Comment:    eGFR values <6042min/1.73 m2 may be an indication of chronic kidney disease (CKD). Calculated eGFR, using the MRDR Study equation, is useful in  patients with stable renal function. The eGFR calculation will not be reliable in acutely ill patients when serum creatinine is changing rapidly. It is not useful in patients on dialysis. The eGFR calculation may not be applicable to patients at the low and high extremes of body sizes, pregnant women, and vegetarians.   02/18/2014 >60  Final    Comment:    eGFR values <14m19mn/1.73 m2 may be an indication of chronic kidney disease (CKD). Calculated eGFR is useful in patients with stable renal function. The eGFR calculation will not be reliable in acutely ill patients when serum creatinine is changing rapidly. It is not useful in  patients on dialysis. The eGFR calculation may not be applicable to patients at the low and high extremes of body sizes, pregnant women, and vegetarians.    GFR, Estimated  Date Value Ref Range Status  01/19/2021 >60  >60 mL/min Final    Comment:    (NOTE) Calculated using the CKD-EPI Creatinine Equation (2021)    eGFR  Date Value Ref Range Status  11/12/2020 58 (L) >59 mL/min/1.73 Final         Passed - Cr in normal range and within 360 days    Creatinine  Date Value Ref Range Status  09/04/2014 0.92 0.60 - 1.30 mg/dL Final   Creatinine, Ser  Date Value Ref Range Status  01/19/2021 0.84 0.44 - 1.00 mg/dL Final         Passed - HBA1C is between 0 and 7.9 and within 180 days    Hemoglobin A1C  Date Value Ref Range Status  04/12/2021 7.2 (A) 4.0 - 5.6 % Final   Hgb A1c MFr Bld  Date Value Ref Range Status  09/07/2020 7.6 (H) 4.8 - 5.6 % Final    Comment:    (NOTE) Pre diabetes:          5.7%-6.4%  Diabetes:              >6.4%  Glycemic control for   <7.0% adults with diabetes          Passed - Valid encounter within last 6 months    Recent Outpatient Visits          1 month ago Type 2 diabetes mellitus with complication, without long-term current use of insulin (HCC)[place]  [facility] Jerrol Banana., MD   5 months ago Type 2 diabetes mellitus with complication, without long-term current use of insulin [facility])   [place] Jerrol Banana., MD   6 months ago Urinary tract infection without hematuria, site unspecified   [place], FNP   8 months ago Cerebrovascular accident (CVA), unspecified mechanism [facility])   [facility] Jerrol Banana., MD   11 months ago Controlled type 2 diabetes mellitus without complication, without long-term current use of insulin [facility])   [facility] Jerrol Banana., MD      Future Appointments            In 1 month Stoioff, Ronda Fairly, MD [place]   In 2 months Jerrol Banana., MD [facility] [place]

## 2021-06-17 ENCOUNTER — Telehealth: Payer: Self-pay

## 2021-06-17 NOTE — Progress Notes (Signed)
    Chronic Care Management Pharmacy Assistant   Name: Leah Schaefer  MRN: [id number] DOB: 12-05-1940  Patient called to be reminded of her telephone appointment with [name], CPP on 06/20/2021 @ 1500  Patient aware of appointment date, time, and type of appointment (either telephone or in person). Patient aware to have/bring all medications, supplements, blood pressure and/or blood sugar logs to visit.  Questions: Are there any concerns you would like to discuss during your office visit? Nothing that she can think og  Are you having any problems obtaining your medications? No  Star Rating Drug: Atorvastatin 40 mg last filled on 05/18/2021 for a 90-Day supply with [place] Losartan 50 mg last filled on 05/01/2021 for a 90-Day supply with [place] Metformin 1000 mg last filled on 04/07/2021 for a 90-Day supply with [place]  Any gaps in medications fill history? No  Care Gaps: Hepatitis C Screening Zoster Vaccines  Diabetic Foot Exam Diabetic Eye Exam COVID-19 Vaccine Booster 4 Tetanus/TDAP  [name], CPA/CMA Clinical Pharmacist Assistant Phone: [phone]

## 2021-06-20 ENCOUNTER — Telehealth: Payer: Medicare HMO

## 2021-06-20 ENCOUNTER — Ambulatory Visit (INDEPENDENT_AMBULATORY_CARE_PROVIDER_SITE_OTHER): Payer: Medicare HMO

## 2021-06-20 DIAGNOSIS — E119 Type 2 diabetes mellitus without complications: Secondary | ICD-10-CM

## 2021-06-20 DIAGNOSIS — I10 Essential (primary) hypertension: Secondary | ICD-10-CM

## 2021-06-20 NOTE — Progress Notes (Signed)
Chronic Care Management Pharmacy Note  06/28/2021 Name:  Leah Schaefer MRN:  [id number] DOB:  15-Oct-1940  Summary: Patient presents for CCM follow-up. She reports doing well although she has had instances of elevated blood pressure recently.  Recommendations/Changes made from today's visit: Continue current medications  Plan: CPP follow-up in 6 months.   Subjective: Leah Schaefer is an 80 y.o. year old female who is a primary patient of Jerrol Banana., MD.  The CCM team was consulted for assistance with disease management and care coordination needs.    Engaged with patient by telephone for follow up visit in response to provider referral for pharmacy case management and/or care coordination services.   Consent to Services:  The patient was given information about Chronic Care Management services, agreed to services, and gave verbal consent prior to initiation of services.  Please see initial visit note for detailed documentation.   Patient Care Team: Jerrol Banana., MD as PCP - General (Family Medicine) Bary Castilla, Forest Gleason, MD (General Surgery) Cammie Sickle, MD as Consulting Physician (Internal Medicine) Lorelee Cover., MD as Consulting Physician (Ophthalmology) Noreene Filbert, MD as Referring Physician (Radiation Oncology) Germaine Pomfret, [facility] as Pharmacist (Pharmacist)  Recent office visits: 04/12/21: patient presented to Dr. Rosanna Randy for follow-up. A1c 7.2%. 12/13/20: Patient presented to Dr. Rosanna Randy for follow-up.  Recent consult visits: 04/15/21: patient presented to Dr. Manuella Ghazi for MCI.   Hospital visits: None in previous 6 months   Objective:  Lab Results  Component Value Date   CREATININE 0.84 01/19/2021   BUN 19 01/19/2021   GFRNONAA >60 01/19/2021   GFRAA 83 05/31/2020   NA 137 01/19/2021   K 3.5 01/19/2021   CALCIUM 10.0 01/19/2021   CO2 27 01/19/2021   GLUCOSE 141 (H) 01/19/2021    Lab Results  Component Value  Date/Time   HGBA1C 7.2 (A) 04/12/2021 03:09 PM   HGBA1C 7.5 (A) 12/13/2020 03:42 PM   HGBA1C 7.6 (H) 09/07/2020 06:41 AM   HGBA1C 7.0 (H) 07/07/2020 08:57 AM   MICROALBUR 100 04/23/2017 09:31 AM   MICROALBUR Negative 03/30/2015 02:56 PM    Last diabetic Eye exam:  Lab Results  Component Value Date/Time   HMDIABEYEEXA No Retinopathy 01/09/2018 12:00 AM    Last diabetic Foot exam:  Lab Results  Component Value Date/Time   HMDIABFOOTEX normal 03/22/2017 12:00 AM     Lab Results  Component Value Date   CHOL 105 09/07/2020   HDL 30 (L) 09/07/2020   LDLCALC 57 09/07/2020   TRIG 89 09/07/2020   CHOLHDL 3.5 09/07/2020    Hepatic Function Latest Ref Rng & Units 01/19/2021 11/12/2020 11/05/2020  Total Protein 6.5 - 8.1 g/dL 8.1 7.6 7.9  Albumin 3.5 - 5.0 g/dL 4.4 4.0 4.4  AST 15 - 41 U/L '16 24 18  ' ALT 0 - 44 U/L 16 40(H) 15  Alk Phosphatase 38 - 126 U/L 64 85 52  Total Bilirubin 0.3 - 1.2 mg/dL 0.5 0.3 0.9  Bilirubin, Direct 0.1 - 0.5 mg/dL - - -    Lab Results  Component Value Date/Time   TSH 1.760 05/31/2020 02:30 PM   TSH 1.920 01/21/2020 09:08 AM    CBC Latest Ref Rng & Units 01/19/2021 11/12/2020 11/08/2020  WBC 4.0 - 10.5 K/uL 6.6 11.1(H) 9.9  Hemoglobin 12.0 - 15.0 g/dL 13.2 12.8 11.6(L)  Hematocrit 36.0 - 46.0 % 38.8 37.3 34.6(L)  Platelets 150 - 400 K/uL 273 356 217    No  results found for: VD25OH  Clinical ASCVD: Yes  The ASCVD Risk score (Arnett DK, et al., 2019) failed to calculate for the following reasons:   The patient has a prior MI or stroke diagnosis    Depression screen [facility] 2/9 04/12/2021 12/13/2020 07/07/2020  Decreased Interest '2 1 1  ' Down, Depressed, Hopeless '2 1 1  ' PHQ - 2 Score '4 2 2  ' Altered sleeping 3 0 0  Tired, decreased energy '2 1 1  ' Change in appetite 2 0 0  Feeling bad or failure about yourself  2 0 0  Trouble concentrating 3 0 0  Moving slowly or fidgety/restless 1 0 0  Suicidal thoughts 0 0 0  PHQ-9 Score '17 3 3  ' Difficult doing work/chores  Somewhat difficult Not difficult at all Not difficult at all  Some recent data might be hidden    Social History   Tobacco Use  Smoking Status Never  Smokeless Tobacco Never   BP Readings from Last 3 Encounters:  04/12/21 133/90  01/19/21 128/85  01/12/21 135/78   Pulse Readings from Last 3 Encounters:  04/12/21 96  01/19/21 73  01/12/21 94   Wt Readings from Last 3 Encounters:  04/12/21 192 lb (87.1 kg)  01/19/21 185 lb 14.4 oz (84.3 kg)  01/12/21 190 lb (86.2 kg)   BMI Readings from Last 3 Encounters:  04/12/21 30.99 kg/m  01/19/21 30.01 kg/m  01/12/21 30.67 kg/m    Assessment/Interventions: Review of patient past medical history, allergies, medications, health status, including review of consultants reports, laboratory and other test data, was performed as part of comprehensive evaluation and provision of chronic care management services.   SDOH:  (Social Determinants of Health) assessments and interventions performed: Yes SDOH Interventions    Flowsheet Row Most Recent Value  SDOH Interventions   Financial Strain Interventions Intervention Not Indicated       SDOH Screenings   Alcohol Screen: Low Risk    Last Alcohol Screening Score (AUDIT): 2  Depression (PHQ2-9): Medium Risk   PHQ-2 Score: 17  Financial Resource Strain: Low Risk    Difficulty of Paying Living Expenses: Not hard at all  Food Insecurity: Not on file  Housing: Not on file  Physical Activity: Not on file  Social Connections: Not on file  Stress: Not on file  Tobacco Use: Low Risk    Smoking Tobacco Use: Never   Smokeless Tobacco Use: Never   Passive Exposure: Not on file  Transportation Needs: Not on file    [place]  No Known Allergies  Medications Reviewed Today     Reviewed by Abbie Sons, MD (Physician) on 01/12/21 at 1523  Med List Status: <None>   Medication Order Taking? Sig Documenting Provider Last Dose Status Informant  acetaminophen (TYLENOL) 500 MG  tablet [id number] Yes Take 500 mg by mouth every 6 (six) hours as needed for mild pain or headache.  [provider] Taking Active Self  atorvastatin (LIPITOR) 40 MG tablet [id number] Yes TAKE 1 TABLET EVERY EVENING  Patient taking differently: Take 40 mg by mouth every evening.   Jerrol Banana., MD Taking Active   buPROPion (WELLBUTRIN XL) 150 MG 24 hr tablet [id number] Yes Take 1 tablet (150 mg total) by mouth daily. Jerrol Banana., MD Taking Active   Calcium Carb-Cholecalciferol 600-800 MG-UNIT TABS [id number] Yes Take 1 tablet by mouth daily. [provider] Taking Active Self  chlorthalidone (HYGROTON) 25 MG tablet [id number] Yes Take 1 tablet (25 mg  total) by mouth daily. Jerrol Banana., MD Taking Active Self  cholecalciferol (VITAMIN D) 1000 units tablet [id number] Yes Take 1,000 Units by mouth daily. [provider] Taking Active Self  clopidogrel (PLAVIX) 75 MG tablet [id number] Yes TAKE 1 TABLET EVERY DAY  Patient taking differently: Take 75 mg by mouth daily.   Jerrol Banana., MD Taking Active Self  cyanocobalamin 500 MCG tablet [id number] Yes Take 500 mcg by mouth daily. [provider] Taking Active Self  gabapentin (NEURONTIN) 300 MG capsule [id number] Yes TAKE 2 CAPSULES AT BEDTIME  Patient taking differently: Take 600 mg by mouth at bedtime.   Jerrol Banana., MD Taking Active   latanoprost (XALATAN) 0.005 % ophthalmic solution [id number] Yes Place 1 drop into both eyes at bedtime.  [provider] Taking Active Self  letrozole [facility]) 2.5 MG tablet [id number] Yes TAKE 1 TABLET (2.5 MG TOTAL) BY MOUTH DAILY. Jerrol Banana., MD Taking Active Self  losartan (COZAAR) 50 MG tablet [id number] Yes TAKE 1 TABLET (50 MG TOTAL) BY MOUTH DAILY. Jerrol Banana., MD Taking Active   meclizine (ANTIVERT) 25 MG tablet [id number] Yes Take 0.5 tablets (12.5 mg total) by mouth 3 (three) times daily as needed for  dizziness. Hinda Kehr, MD Taking Active Self  meloxicam [facility]) 15 MG tablet [id number] Yes TAKE 1 TABLET EVERY DAY Jerrol Banana., MD Taking Active   metFORMIN (GLUCOPHAGE) 1000 MG tablet [id number] Yes TAKE 1 TABLET TWICE DAILY  WITH  A  MEAL Jerrol Banana., MD Taking Active   metoprolol tartrate (LOPRESSOR) 25 MG tablet [id number] Yes TAKE 1/2 TABLET (12.5 MG TOTAL) TWICE DAILY  Patient taking differently: Take 12.5 mg by mouth 2 (two) times daily.   Jerrol Banana., MD Taking Active   Multiple Vitamin (MULTIVITAMIN WITH MINERALS) TABS tablet [id number] Yes Take 1 tablet by mouth daily. [provider] Taking Active Self  omega-3 acid ethyl esters (LOVAZA) 1 g capsule [id number] Yes Take 1 g by mouth daily.  [provider] Taking Active Self  pantoprazole (PROTONIX) 40 MG tablet [id number] Yes TAKE 1 TABLET TWICE DAILY  Patient taking differently: Take 40 mg by mouth daily.   Jerrol Banana., MD Taking Active   pyridoxine (B-6) 100 MG tablet [id number] Yes Take 100 mg by mouth daily. [provider] Taking Active Self  sertraline (ZOLOFT) 100 MG tablet [id number] Yes Take 0.5 tablets (50 mg total) by mouth daily. Doctor increased dose to 135m 05/2020  but patient reports that she never increased the dose. She continued taking 573mdaily.  Patient taking differently: Take 100 mg by mouth daily.   GiJerrol Banana MD Taking Active Self  vitamin C (ASCORBIC ACID) 500 MG tablet [id number] Take 500 mg by mouth daily. [provider] Taking Active Self  vitamin E 400 UNIT capsule [id number] Take 400 Units by mouth daily. [provider] Taking Active Self            Patient Active Problem List   Diagnosis Date Noted   Type 2 diabetes mellitus with complication, without long-term current use of insulin (HC[facility]iff[facility]05/13/2022   Severe sepsis ([place]CL[place]0470/34/0352 Complicated UTI (urinary tract infection) 11/05/2020    Hydronephrosis with renal and ureteral calculus obstruction 11/05/2020   History of breast cancer 11/05/2020   History of CVA (cerebrovascular accident) 11/05/2020   AKI (acute kidney injury) (HC[facility]04/08/2020   Hemiparesis affecting left  side as late effect of stroke ([place]) 11/05/2020   Postural dizziness with presyncope 11/05/2020   Stroke ([place]) 09/06/2020   Leukocytes in urine 06/09/2020   Grief 06/09/2020   Episode of recurrent major depressive disorder ([place]) 06/09/2020   Urinary symptom or sign 06/09/2020   Left sided numbness 11/06/2018   Fat necrosis of breast 67/34/1937   Umbilical pain 90/24/0973   Breast mass, left 04/07/2016   Controlled type 2 diabetes mellitus without complication ([place]) 53/29/9242   Allergic rhinitis 03/30/2015   Anxiety 03/30/2015   Carotid arterial disease ([place]) 03/30/2015   Diabetes mellitus, type 2 ([place]) 03/30/2015   Essential (primary) hypertension 03/30/2015   Hypercholesteremia 03/30/2015   Left leg pain 03/30/2015   LBP (low back pain) 03/30/2015   Neuropathic pain 03/30/2015   Burning or prickling sensation 03/30/2015   Neuralgia neuritis, sciatic nerve 03/30/2015   Skin cyst 11/24/2014   Carcinoma of upper-outer quadrant of right breast in female, estrogen receptor positive ([place]) 08/17/2011   Allergic reaction 11/29/2009   Adaptation reaction 04/30/2009   Acid reflux 04/30/2009   Stricture and stenosis of cervix 01/28/2009    Immunization History  Administered Date(s) Administered   Fluad Quad(high Dose 65+) 06/24/2019, 05/03/2020, 05/11/2021   Influenza, High Dose Seasonal PF 05/13/2014, 05/29/2015, 04/05/2016, 04/19/2017, 04/11/2018   Influenza,inj,Quad PF,6+ Mos 05/23/2013   PFIZER(Purple Top)SARS-COV-2 Vaccination 10/17/2019, 11/11/2019, 07/07/2020   Pneumococcal Conjugate-13 05/13/2014   Pneumococcal Polysaccharide-23 06/07/2012   Td 04/19/2007, 05/05/2011   Tdap 05/05/2011   Zoster, Live 04/27/2008    Conditions to be  addressed/monitored:  Hypertension, Hyperlipidemia, Diabetes, and Coronary Artery Disease  Care Plan : General Pharmacy (Adult)  Updates made by Germaine Pomfret, RPH since 06/28/2021 12:00 AM     Problem: Hypertension, Hyperlipidemia, Diabetes, and Coronary Artery Disease   Priority: High     Long-Range Goal: Patient-Specific Goal   Start Date: 02/21/2021  Expected End Date: 03/14/2022  This Visit's Progress: On track  Recent Progress: On track  Priority: High  Note:   Current Barriers:  No barriers noted  Pharmacist Clinical Goal(s):  Patient will maintain control of blood pressure as evidenced by BP less than 140/90  through collaboration with PharmD and provider.   Interventions: 1:1 collaboration with Jerrol Banana., MD regarding development and update of comprehensive plan of care as evidenced by provider attestation and co-signature Inter-disciplinary care team collaboration (see longitudinal plan of care) Comprehensive medication review performed; medication list updated in electronic medical record  Hypertension (BP goal <140/90) -Controlled -Current treatment: Chlorthalidone 25 mg daily  Losartan 50 mg daily  Metoprolol tartrate 25 mg 1/2 tablet twice daily  -Medications previously tried: NA  -Current home readings:  13-Nov 119/91  12-Nov 120/103  8-Nov 99/65  7-Nov 122/72  2-Nov 111/77   -Current dietary habits: eating more desserts, apple pie, raspberry cake,  -Current exercise habits: Walking, arm / legs exercises twice daily.  -Denies hypotensive/hypertensive symptoms -Educated on Daily salt intake goal < 2300 mg; -Counseled to monitor BP at home weekly, document, and provide log at future appointments -Recommended to continue current medication  Hyperlipidemia: (LDL goal < 70) -Controlled -Current treatment: Atorvastatin 40 mg daily -Medications previously tried: NA -Recommended to continue current medication  Diabetes (A1c goal  <8%) -Controlled -Current medications: Metformin 1000 mg twice daily  -Medications previously tried: NA  -Current home glucose readings fasting glucose: 154, 164, 140, 142,  -Denies hypoglycemic/hyperglycemic symptoms -Recommended to continue current medication  Patient Goals/Self-Care Activities Patient will:  -  check glucose daily before breakfast, document, and provide at future appointments check blood pressure weekly, document, and provide at future appointments  Follow Up Plan: Telephone follow up appointment with care management team member scheduled for:  12/19/2020 at 3:00 PM       Medication Assistance: None required.  Patient affirms current coverage meets needs.  Compliance/Adherence/Medication fill history: Care Gaps: Hepatitis C  Shingrix Foot Exam Ophthalmology Exam  Covid Booster Influenza  Star-Rating Drugs: Atorvastatin 40 mg daily: LF 12/29/20 for 90-DS Losartan 50 mg daily: LF 12/05/20 for 90-DS Metformin 1000 mg: LF 01/19/21 for 90-DS   Patient's preferred pharmacy is:  [facility] [place], [place] [address] Phone: [phone] Fax: [phone]  [place] [address] [place]) [place] [address] Phone: [phone] Fax: [phone]  Uses pill box? Yes Pt endorses 100% compliance  We discussed: Current pharmacy is preferred with insurance plan and patient is satisfied with pharmacy services Patient decided to: Continue current medication management strategy  Care Plan and Follow Up Patient Decision:  Patient agrees to Care Plan and Follow-up.  Plan: Telephone follow up appointment with care management team member scheduled for:  12/19/2020 at 3:00 PM  [name], PharmD, [place], [place] [phone]

## 2021-06-28 NOTE — Patient Instructions (Signed)
Visit Information It was great speaking with you today!  Please let me know if you have any questions about our visit.   Goals Addressed             This Visit's Progress    Monitor and Manage My Blood Sugar-Diabetes Type 2   On track    Timeframe:  Long-Range Goal Priority:  High Start Date:  02/21/2021                            Expected End Date:  02/21/2022                     Follow Up within 90 days   - check blood sugar at prescribed times - check blood sugar if I feel it is too high or too low - enter blood sugar readings and medication or insulin into daily log    Why is this important?   Checking your blood sugar at home helps to keep it from getting very high or very low.  Writing the results in a diary or log helps the doctor know how to care for you.  Your blood sugar log should have the time, date and the results.  Also, write down the amount of insulin or other medicine that you take.  Other information, like what you ate, exercise done and how you were feeling, will also be helpful.     Notes:         Patient Care Plan: General Pharmacy (Adult)     Problem Identified: Hypertension, Hyperlipidemia, Diabetes, and Coronary Artery Disease   Priority: High     Long-Range Goal: Patient-Specific Goal   Start Date: 02/21/2021  Expected End Date: 03/14/2022  This Visit's Progress: On track  Recent Progress: On track  Priority: High  Note:   Current Barriers:  No barriers noted  Pharmacist Clinical Goal(s):  Patient will maintain control of blood pressure as evidenced by BP less than 140/90  through collaboration with PharmD and provider.   Interventions: 1:1 collaboration with Jerrol Banana., MD regarding development and update of comprehensive plan of care as evidenced by provider attestation and co-signature Inter-disciplinary care team collaboration (see longitudinal plan of care) Comprehensive medication review performed; medication list updated  in electronic medical record  Hypertension (BP goal <140/90) -Controlled -Current treatment: Chlorthalidone 25 mg daily  Losartan 50 mg daily  Metoprolol tartrate 25 mg 1/2 tablet twice daily  -Medications previously tried: NA  -Current home readings:  13-Nov 119/91  12-Nov 120/103  8-Nov 99/65  7-Nov 122/72  2-Nov 111/77   -Current dietary habits: eating more desserts, apple pie, raspberry cake,  -Current exercise habits: Walking, arm / legs exercises twice daily.  -Denies hypotensive/hypertensive symptoms -Educated on Daily salt intake goal < 2300 mg; -Counseled to monitor BP at home weekly, document, and provide log at future appointments -Recommended to continue current medication  Hyperlipidemia: (LDL goal < 70) -Controlled -Current treatment: Atorvastatin 40 mg daily -Medications previously tried: NA -Recommended to continue current medication  Diabetes (A1c goal <8%) -Controlled -Current medications: Metformin 1000 mg twice daily  -Medications previously tried: NA  -Current home glucose readings fasting glucose: 154, 164, 140, 142,  -Denies hypoglycemic/hyperglycemic symptoms -Recommended to continue current medication  Patient Goals/Self-Care Activities Patient will:  - check glucose daily before breakfast, document, and provide at future appointments check blood pressure weekly, document, and provide at future appointments  Follow Up  Plan: Telephone follow up appointment with care management team member scheduled for:  12/19/2020 at 3:00 PM      Patient agreed to services and verbal consent obtained.   Patient verbalizes understanding of instructions provided today and agrees to view in [place].   [name], PharmD, [place], CPP  Clinical Pharmacist Practitioner  [facility] [phone]

## 2021-07-06 DIAGNOSIS — Z7984 Long term (current) use of oral hypoglycemic drugs: Secondary | ICD-10-CM

## 2021-07-06 DIAGNOSIS — E119 Type 2 diabetes mellitus without complications: Secondary | ICD-10-CM

## 2021-07-06 DIAGNOSIS — E785 Hyperlipidemia, unspecified: Secondary | ICD-10-CM

## 2021-07-06 DIAGNOSIS — I10 Essential (primary) hypertension: Secondary | ICD-10-CM

## 2021-07-15 ENCOUNTER — Ambulatory Visit
Admission: RE | Admit: 2021-07-15 | Discharge: 2021-07-15 | Disposition: A | Discharge status: Home / Self Care | Payer: Medicare HMO | Source: Ambulatory Visit | Attending: Urology | Admitting: Urology

## 2021-07-15 ENCOUNTER — Encounter: Payer: Self-pay | Admitting: Urology

## 2021-07-15 ENCOUNTER — Other Ambulatory Visit: Payer: Self-pay

## 2021-07-15 ENCOUNTER — Ambulatory Visit: Payer: Medicare HMO | Admitting: Urology

## 2021-07-15 VITALS — BP 140/82 | HR 76 | Ht 66.0 in | Wt 190.0 lb

## 2021-07-15 DIAGNOSIS — Z87442 Personal history of urinary calculi: Secondary | ICD-10-CM

## 2021-07-15 NOTE — Progress Notes (Signed)
07/15/2021 11:41 AM   Leah Schaefer 17-Nov-1940 [id number]  Referring provider: Jerrol Banana., MD [address] [place],  [place] [address]  Chief Complaint  Patient presents with   Nephrolithiasis    Urologic history 1.  History urinary calculi Urgent stent placement 11/06/2020 for 10 mm UPJ calculus with infection CT with additional lower pole calculi Ureteroscopy/laser lithotripsy of left renal calculi 12/07/2020 Stone analysis 100% carbonate apatite   HPI: 80 y.o. female presents for 6 month follow-up.  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   Current use of long term anticoagulation    Clopidogrel   Cystitis    Diabetes mellitus without complication (HCC)    NO MEDS-DIET CONTROLLED   GERD (gastroesophageal reflux disease)    Hemiparesis affecting left side as late effect of stroke ([facility]    Hernia 2011   History of hiatal hernia    History of kidney stones    Hyperlipidemia    Hypertension    Malignant neoplasm of upper-inner quadrant of female breast ([facility] 08/17/2011   Left, T2 (2.3 cm) N0, triple negative. Chemotherapy/post wide excision whole breast radiation.   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    Personal history of chemotherapy 2013   LEFT BREAST   Personal history of malignant neoplasm of breast    Personal history of radiation therapy 2017    Rt, 2013 had on left    Stroke ([facility] 09/06/2020   1 cm acute infarction in the right parietal deep and subcortical white matter   Vertigo     Surgical History: Past Surgical History:  Procedure Laterality Date   BREAST BIOPSY Left 07/18/2011   +   BREAST BIOPSY Left 09/21/2015   neg   BREAST BIOPSY Left 04/05/2016   neg   BREAST BIOPSY Left 03/20/2019   uKoreabx 2 areas /fat necrosis at 9:30 ribbon and 5:30 coil   [facility]  [facility]/CLEAR MARGINS, NEGATIVE LN   BREAST LUMPECTOMY Left 08/17/2011   BREAST LUMPECTOMY WITH SENTINEL LYMPH NODE BIOPSY Right 09/30/2015   Procedure: BREAST LUMPECTOMY WITH SENTINEL LYMPH NODE BX;  Surgeon: JRobert Bellow MD;  Location: [facility] ORS;  Service: General;  Laterality: Right;   BREAST SURGERY Left 2012   wide excision   BREAST SURGERY Left November 2013   Core biopsy of the upper-outer quadrant showed fat necrosis.   CHOLECYSTECTOMY  2007   COLONOSCOPY  2011   Dr OCandace Cruise  COLONOSCOPY WITH PROPOFOL N/A 02/25/2015   Procedure: COLONOSCOPY WITH PROPOFOL;  Surgeon: PHulen Luster MD;  [facility];  Service: Gastroenterology;  Laterality: N/A;   CYSTOSCOPY WITH STENT PLACEMENT Left 11/05/2020   Procedure: CYSTOSCOPY WITH STENT PLACEMENT;  Surgeon: SAbbie Sons MD;  Location:  [facility] ORS;  Service: Urology;  Laterality: Left;   CYSTOSCOPY/URETEROSCOPY/HOLMIUM LASER/STENT PLACEMENT Left 12/07/2020   Procedure: CYSTOSCOPY/URETEROSCOPY/HOLMIUM LASER/STENT PLACEMENT;  Surgeon: Abbie Sons, MD;  Location: [facility] ORS;  Service: Urology;  Laterality: Left;   DILATION AND CURETTAGE OF UTERUS  2004   HERNIA REPAIR Right 93/57/0177   Umbilical/ventral hernia repaired with 6.4 cm Proceed ventral patch   PORT-A-CATH REMOVAL     PORTACATH PLACEMENT  2013   RECURRENT HERNIA N/A 01/07/2008   Recurrent ventral hernia at the umbilicus, laparoscopy, open placement of a large  Ultra Pro mesh with trans-fascial sutures.   UPPER GI ENDOSCOPY  2011    Home Medications:  Allergies as of 07/15/2021   No Known Allergies      Medication List        Accurate as of July 15, 2021 11:41 AM. If you have any questions, ask your nurse or doctor.          Accu-Chek Aviva Plus test strip Generic drug: glucose blood CHECK  FASTING  BLOOD  GLUCOSE EVERY DAY   Accu-Chek Softclix Lancets lancets TEST BLOOD SUGAR EVERY DAY   acetaminophen 500 MG tablet Commonly known as: TYLENOL Take 500 mg by mouth every 6 (six) hours as needed for mild pain or headache.   atorvastatin 40 MG tablet Commonly known as: LIPITOR TAKE 1 TABLET EVERY EVENING   buPROPion 150 MG 24 hr tablet Commonly known as: Wellbutrin XL Take 1 tablet (150 mg total) by mouth daily.   Calcium Carb-Cholecalciferol 600-800 MG-UNIT Tabs Take 1 tablet by mouth daily.   chlorthalidone 25 MG tablet Commonly known as: HYGROTON Take 1 tablet (25 mg total) by mouth daily.   cholecalciferol 1000 units tablet Commonly known as: VITAMIN D Take 1,000 Units by mouth daily.   clopidogrel 75 MG tablet Commonly known as: PLAVIX TAKE 1 TABLET EVERY DAY   gabapentin 300 MG capsule Commonly known as: NEURONTIN TAKE 2 CAPSULES AT BEDTIME   latanoprost 0.005 % ophthalmic solution Commonly known as: XALATAN Place 1 drop into both eyes at bedtime.   letrozole 2.5 MG tablet Commonly known as: FEMARA TAKE 1 TABLET (2.5 MG TOTAL) BY MOUTH DAILY.   losartan 50 MG tablet Commonly known as: COZAAR TAKE 1 TABLET (50 MG TOTAL) BY MOUTH DAILY.   meclizine 25 MG tablet Commonly known as: ANTIVERT Take 0.5 tablets (12.5 mg total) by mouth 3 (three) times daily as needed for dizziness.   meloxicam 15 MG tablet Commonly known as: MOBIC TAKE 1 TABLET EVERY DAY   metFORMIN 1000 MG tablet Commonly known as: GLUCOPHAGE TAKE 1 TABLET TWICE DAILY  WITH  A  MEAL   metoprolol tartrate 25 MG tablet Commonly  known as: LOPRESSOR TAKE 1/2 TABLET TWICE DAILY   multivitamin with minerals Tabs tablet Take 1 tablet by mouth daily.   omega-3 acid ethyl esters 1 g capsule Commonly known as: LOVAZA Take 1 g by mouth daily.   pantoprazole 40 MG tablet Commonly known as: PROTONIX TAKE 1 TABLET TWICE DAILY   pyridoxine 100 MG tablet Commonly known as: B-6 Take 100 mg by mouth daily.   sertraline 100 MG tablet Commonly known as: ZOLOFT TAKE 1/2 TABLET EVERY DAY   vitamin B-12 500 MCG tablet Commonly known as: CYANOCOBALAMIN Take 500 mcg by mouth daily.   vitamin C 500 MG tablet Commonly known as: ASCORBIC ACID Take 500 mg by mouth daily.   vitamin E 180 MG (400 UNITS) capsule Take 400 Units by mouth daily.  Allergies: No Known Allergies  Family History: Family History  Problem Relation Age of Onset   Lymphoma Father    Ovarian cancer Sister    Colon cancer Brother    Lymphoma Brother    Cancer Other        stomach   Cancer Other        stomach   Breast cancer Neg Hx     Social History:  reports that she has never smoked. She has never used smokeless tobacco. She reports that she does not drink alcohol and does not use drugs.   Physical Exam: BP 140/82   Pulse 76   Ht '5\' 6"'  (1.676 m)   Wt 190 lb (86.2 kg)   BMI 30.67 kg/m   Constitutional:  Alert and oriented, No acute distress. HEENT: [place] AT, moist mucus membranes.  Trachea midline, no masses. Cardiovascular: No clubbing, cyanosis, or edema. Respiratory: Normal respiratory effort, no increased work of breathing. Psychiatric: Normal mood and affect.  Pertinent Imaging: Images of KUB performed today were personally reviewed and interpreted.  No calcifications identified suspicious for urinary tract stones   Assessment & Plan:    1.  History urinary calculi KUB today negative Contact [facility] to obtain metabolic evaluation results and will notify patient   Abbie Sons, [place] [address] [place], [place] [address] [phone]

## 2021-07-19 ENCOUNTER — Other Ambulatory Visit: Payer: Self-pay | Admitting: Urology

## 2021-07-20 ENCOUNTER — Ambulatory Visit: Payer: Medicare HMO | Admitting: Internal Medicine

## 2021-07-20 ENCOUNTER — Other Ambulatory Visit: Payer: Medicare HMO

## 2021-07-20 ENCOUNTER — Telehealth: Payer: Self-pay

## 2021-07-20 NOTE — Progress Notes (Signed)
Chronic Care Management Pharmacy Assistant   Name: Leah Schaefer  MRN: [id number] DOB: [birth year]  Reason for Encounter: Hypertension Disease State Call   Recent office visits:  None ID  Recent consult visits:  07/15/2021 John Giovanni, MD (Urology) for Follow-up- No medication changes noted, no orders placed, no follow-up noted  Hospital visits:  None in previous 6 months  Medications: Outpatient Encounter Medications as of 07/20/2021  Medication Sig   ACCU-CHEK AVIVA PLUS test strip CHECK  FASTING  BLOOD  GLUCOSE EVERY DAY   Accu-Chek Softclix Lancets lancets TEST BLOOD SUGAR EVERY DAY   acetaminophen (TYLENOL) 500 MG tablet Take 500 mg by mouth every 6 (six) hours as needed for mild pain or headache.    atorvastatin (LIPITOR) 40 MG tablet TAKE 1 TABLET EVERY EVENING   buPROPion (WELLBUTRIN XL) 150 MG 24 hr tablet Take 1 tablet (150 mg total) by mouth daily.   Calcium Carb-Cholecalciferol 600-800 MG-UNIT TABS Take 1 tablet by mouth daily.   chlorthalidone (HYGROTON) 25 MG tablet Take 1 tablet (25 mg total) by mouth daily.   cholecalciferol (VITAMIN D) 1000 units tablet Take 1,000 Units by mouth daily.   clopidogrel (PLAVIX) 75 MG tablet TAKE 1 TABLET EVERY DAY   cyanocobalamin 500 MCG tablet Take 500 mcg by mouth daily.   gabapentin (NEURONTIN) 300 MG capsule TAKE 2 CAPSULES AT BEDTIME (Patient taking differently: Take 600 mg by mouth at bedtime.)   latanoprost (XALATAN) 0.005 % ophthalmic solution Place 1 drop into both eyes at bedtime.    letrozole (FEMARA) 2.5 MG tablet TAKE 1 TABLET (2.5 MG TOTAL) BY MOUTH DAILY.   losartan (COZAAR) 50 MG tablet TAKE 1 TABLET (50 MG TOTAL) BY MOUTH DAILY.   meclizine (ANTIVERT) 25 MG tablet Take 0.5 tablets (12.5 mg total) by mouth 3 (three) times daily as needed for dizziness.   meloxicam (MOBIC) 15 MG tablet TAKE 1 TABLET EVERY DAY   metFORMIN (GLUCOPHAGE) 1000 MG tablet TAKE 1 TABLET TWICE DAILY  WITH  A  MEAL   metoprolol tartrate  (LOPRESSOR) 25 MG tablet TAKE 1/2 TABLET TWICE DAILY   Multiple Vitamin (MULTIVITAMIN WITH MINERALS) TABS tablet Take 1 tablet by mouth daily.   omega-3 acid ethyl esters (LOVAZA) 1 g capsule Take 1 g by mouth daily.    pantoprazole (PROTONIX) 40 MG tablet TAKE 1 TABLET TWICE DAILY   pyridoxine (B-6) 100 MG tablet Take 100 mg by mouth daily.   sertraline (ZOLOFT) 100 MG tablet TAKE 1/2 TABLET EVERY DAY   vitamin C (ASCORBIC ACID) 500 MG tablet Take 500 mg by mouth daily.   vitamin E 400 UNIT capsule Take 400 Units by mouth daily.   No facility-administered encounter medications on file as of 07/20/2021.   Care Gaps: Hepatitis C Screening [place] Vaccines Diabetic Foot Exam Diabetic Eye Exam COVID-19 Vaccine Tetanus/TDAP  Star Rating Drugs: Atorvastatin 40 mg last filled on 05/18/2021 for a 90-Day supply with [facility] Losartan 50 mg last filled on 05/01/2021 for a 90-Day supply with [facility] Metformin 1000 mg last filled on 06/14/2021 for a 90-Day supply with [facility]  Reviewed chart prior to disease state call. Spoke with patient regarding BP  Recent Office Vitals: BP Readings from Last 3 Encounters:  07/15/21 140/82  04/12/21 133/90  01/19/21 128/85   Pulse Readings from Last 3 Encounters:  07/15/21 76  04/12/21 96  01/19/21 73    Wt Readings from Last 3 Encounters:  07/15/21 190 lb (86.2 kg)  04/12/21 192 lb (87.1 kg)  01/19/21 185 lb 14.4 oz (84.3 kg)     Kidney Function Lab Results  Component Value Date/Time   CREATININE 0.84 01/19/2021 09:09 AM   CREATININE 1.00 11/12/2020 01:40 PM   CREATININE 0.92 09/04/2014 09:16 AM   CREATININE 0.89 02/18/2014 10:48 AM   GFRNONAA >60 01/19/2021 09:09 AM   GFRNONAA >60 09/04/2014 09:16 AM   GFRNONAA >60 02/18/2014 10:48 AM   GFRAA 83 05/31/2020 02:30 PM   GFRAA >60 09/04/2014 09:16 AM   GFRAA >60 02/18/2014 10:48 AM    BMP Latest Ref Rng & Units 01/19/2021 11/12/2020 11/08/2020  Glucose 70  - 99 mg/dL 141(H) 102(H) 162(H)  BUN 8 - 23 mg/dL 19 11 14   Creatinine 0.44 - 1.00 mg/dL 0.84 1.00 0.88  BUN/Creat Ratio 12 - 28 - 11(L) -  Sodium 135 - 145 mmol/L 137 140 137  Potassium 3.5 - 5.1 mmol/L 3.5 4.0 3.7  Chloride 98 - 111 mmol/L 100 98 104  CO2 22 - 32 mmol/L 27 22 26   Calcium 8.9 - 10.3 mg/dL 10.0 10.0 8.9    Current antihypertensive regimen:  Metoprolol Tartrate 25 mg take 1/2 tablet twice daily Losartan 50 mg 1 tablet daily Chlorthalidone 25 mg 1 tablet daily  How often are you checking your Blood Pressure? 3-5x per week  Current home BP readings: 12/15 117/74 Pulse 81  What recent interventions/DTPs have been made by any provider to improve Blood Pressure control since last CPP Visit: None ID  Any recent hospitalizations or ED visits since last visit with CPP? No  What diet changes have been made to improve Blood Pressure Control?  Patient is a diabetic so she does watch her sugar and carb intake as well as her salt and sodium intake.   What exercise is being done to improve your Blood Pressure Control?  Patient does not exercise. She is able to move around on her own to do things that she needs to do.   Adherence Review: Is the patient currently on ACE/ARB medication? Yes Does the patient have >5 day gap between last estimated fill dates? No  Patient reports that she is doing well today. Patient denies having any ill symptoms. Patient denies needing any refills. Patient has no concerns or issues at this time. Patient encouraged to give [place] a call if she needs anything.   Patient has a scheduled telephone appointment with CPP on 12/19/2021 @ [place], CPA/CMA Catering manager Phone: [phone]

## 2021-08-05 ENCOUNTER — Telehealth: Payer: Self-pay

## 2021-08-05 ENCOUNTER — Other Ambulatory Visit: Payer: Self-pay

## 2021-08-05 ENCOUNTER — Encounter: Payer: Self-pay | Admitting: Internal Medicine

## 2021-08-05 ENCOUNTER — Inpatient Hospital Stay: Payer: Medicare HMO | Admitting: Internal Medicine

## 2021-08-05 ENCOUNTER — Inpatient Hospital Stay: Payer: Medicare HMO | Attending: Internal Medicine | Admitting: Internal Medicine

## 2021-08-05 DIAGNOSIS — C50411 Malignant neoplasm of upper-outer quadrant of right female breast: Secondary | ICD-10-CM | POA: Insufficient documentation

## 2021-08-05 DIAGNOSIS — Z808 Family history of malignant neoplasm of other organs or systems: Secondary | ICD-10-CM | POA: Insufficient documentation

## 2021-08-05 DIAGNOSIS — M2550 Pain in unspecified joint: Secondary | ICD-10-CM | POA: Diagnosis not present

## 2021-08-05 DIAGNOSIS — M549 Dorsalgia, unspecified: Secondary | ICD-10-CM | POA: Insufficient documentation

## 2021-08-05 DIAGNOSIS — Z7901 Long term (current) use of anticoagulants: Secondary | ICD-10-CM | POA: Insufficient documentation

## 2021-08-05 DIAGNOSIS — Z170 Estrogen receptor positive status [ER+]: Secondary | ICD-10-CM | POA: Diagnosis not present

## 2021-08-05 DIAGNOSIS — Z79899 Other long term (current) drug therapy: Secondary | ICD-10-CM | POA: Diagnosis not present

## 2021-08-05 DIAGNOSIS — E119 Type 2 diabetes mellitus without complications: Secondary | ICD-10-CM | POA: Diagnosis not present

## 2021-08-05 DIAGNOSIS — I10 Essential (primary) hypertension: Secondary | ICD-10-CM | POA: Insufficient documentation

## 2021-08-05 DIAGNOSIS — Z9049 Acquired absence of other specified parts of digestive tract: Secondary | ICD-10-CM | POA: Diagnosis not present

## 2021-08-05 DIAGNOSIS — Z800 Family history of malignant neoplasm of digestive organs: Secondary | ICD-10-CM | POA: Diagnosis not present

## 2021-08-05 DIAGNOSIS — Z8041 Family history of malignant neoplasm of ovary: Secondary | ICD-10-CM | POA: Diagnosis not present

## 2021-08-05 DIAGNOSIS — Z87442 Personal history of urinary calculi: Secondary | ICD-10-CM | POA: Diagnosis not present

## 2021-08-05 DIAGNOSIS — G8929 Other chronic pain: Secondary | ICD-10-CM | POA: Insufficient documentation

## 2021-08-05 DIAGNOSIS — Z8673 Personal history of transient ischemic attack (TIA), and cerebral infarction without residual deficits: Secondary | ICD-10-CM | POA: Insufficient documentation

## 2021-08-05 DIAGNOSIS — Z923 Personal history of irradiation: Secondary | ICD-10-CM | POA: Diagnosis not present

## 2021-08-05 DIAGNOSIS — Z79811 Long term (current) use of aromatase inhibitors: Secondary | ICD-10-CM | POA: Insufficient documentation

## 2021-08-05 LAB — CBC WITH DIFFERENTIAL/PLATELET
Abs Immature Granulocytes: 0.01 10*3/uL (ref 0.00–0.07)
Basophils Absolute: 0.1 10*3/uL (ref 0.0–0.1)
Basophils Relative: 1 %
Eosinophils Absolute: 0.1 10*3/uL (ref 0.0–0.5)
Eosinophils Relative: 2 %
HCT: 39.8 % (ref 36.0–46.0)
Hemoglobin: 13.5 g/dL (ref 12.0–15.0)
Immature Granulocytes: 0 %
Lymphocytes Relative: 35 %
Lymphs Abs: 2.5 10*3/uL (ref 0.7–4.0)
MCH: 29.9 pg (ref 26.0–34.0)
MCHC: 33.9 g/dL (ref 30.0–36.0)
MCV: 88.2 fL (ref 80.0–100.0)
Monocytes Absolute: 0.5 10*3/uL (ref 0.1–1.0)
Monocytes Relative: 8 %
Neutro Abs: 3.8 10*3/uL (ref 1.7–7.7)
Neutrophils Relative %: 54 %
Platelets: 249 10*3/uL (ref 150–400)
RBC: 4.51 MIL/uL (ref 3.87–5.11)
RDW: 12.6 % (ref 11.5–15.5)
WBC: 7 10*3/uL (ref 4.0–10.5)
nRBC: 0 % (ref 0.0–0.2)

## 2021-08-05 LAB — COMPREHENSIVE METABOLIC PANEL
ALT: 16 U/L (ref 0–44)
AST: 19 U/L (ref 15–41)
Albumin: 4.3 g/dL (ref 3.5–5.0)
Alkaline Phosphatase: 67 U/L (ref 38–126)
Anion gap: 12 (ref 5–15)
BUN: 21 mg/dL (ref 8–23)
CO2: 26 mmol/L (ref 22–32)
Calcium: 9.3 mg/dL (ref 8.9–10.3)
Chloride: 98 mmol/L (ref 98–111)
Creatinine, Ser: 1.11 mg/dL — ABNORMAL HIGH (ref 0.44–1.00)
GFR, Estimated: 51 mL/min — ABNORMAL LOW (ref >60)
Glucose, Bld: 109 mg/dL — ABNORMAL HIGH (ref 70–99)
Potassium: 3.8 mmol/L (ref 3.5–5.1)
Sodium: 136 mmol/L (ref 135–145)
Total Bilirubin: 0.2 mg/dL — ABNORMAL LOW (ref 0.3–1.2)
Total Protein: 7.7 g/dL (ref 6.5–8.1)

## 2021-08-05 NOTE — Progress Notes (Signed)
[facility] OFFICE PROGRESS NOTE  Patient Care Team: Jerrol Banana., MD as PCP - General (Family Medicine) Bary Castilla, Forest Gleason, MD (General Surgery) Cammie Sickle, MD as Consulting Physician (Internal Medicine) Lorelee Cover., MD as Consulting Physician (Ophthalmology) Noreene Filbert, MD as Referring Physician (Radiation Oncology) Germaine Pomfret, [facility] as Pharmacist (Pharmacist)   SUMMARY OF HEMATOLOGIC/ONCOLOGIC HISTORY:  Oncology History Overview Note  # FEB 2017- RIGHT BREAST  STAGE I [T1b N0 M0]. ER/PR ER positive 100% PR positive 95% HER 2 FISH negative;  INVASIVE MAMMARY CARCINOMA WITH MICROPAPILLARY FEATURES. S/p Lumpec [Dr.Byrnett] & RT; No Oncotype; Femara  # 2013- LEFT BREAST- STAGE II; ER/PR/ Her 2 NEG s/p Lumpec [Dr.Byrnett] & RT  # BMD- April  2017- wnl; genetic testing- declined [May 2017]  # Vertigo [uses rolling walker]   Carcinoma of upper-outer quadrant of right breast in female, estrogen receptor positive ([place])     INTERVAL HISTORY: Alone.  Ambulating independently.   A very pleasant 80 year old African-American female patient with above history of breast cancer most recently diagnosed in February 2017 is here for follow-up; patient currently on Femara.  Patient has had no further strokes.  No further deficits.  Ambulating independently at this time. Denies any unusual lumps or bumps.  Appetite is fair.  Chronic back pain not any worse.  Review of Systems  Constitutional:  Negative for chills, diaphoresis, fever, malaise/fatigue and weight loss.  HENT:  Negative for nosebleeds and sore throat.   Eyes:  Negative for double vision.  Respiratory:  Negative for cough, hemoptysis, sputum production, shortness of breath and wheezing.   Cardiovascular:  Negative for chest pain, palpitations, orthopnea and leg swelling.  Gastrointestinal:  Negative for abdominal pain, blood in stool, constipation, diarrhea, heartburn, melena, nausea  and vomiting.  Genitourinary:  Negative for dysuria, frequency and urgency.  Musculoskeletal:  Positive for back pain and joint pain.  Skin: Negative.  Negative for itching and rash.  Neurological:  Negative for dizziness, tingling, focal weakness, weakness and headaches.  Endo/Heme/Allergies:  Does not bruise/bleed easily.  Psychiatric/Behavioral:  Negative for depression. The patient is not nervous/anxious and does not have insomnia.     PAST MEDICAL HISTORY :  Past Medical History:  Diagnosis Date   Anxiety    Aortic atherosclerosis ([place])    Arthritis    Breast cancer ([place]) 2013   LEFT breast with chemo and rad tx   Breast cancer ([place]) 2017   right breast with rad tx   Breast cancer of upper-inner quadrant of right female breast ([place]) 09/21/2015   T1bN0 " 69m; ER100%, PR 90%, HER-2/neu not overexpressed.   Current use of long term anticoagulation    Clopidogrel   Cystitis    Diabetes mellitus without complication (HCC)    NO MEDS-DIET CONTROLLED   GERD (gastroesophageal reflux disease)    Hemiparesis affecting left side as late effect of stroke ([place]    Hernia 2011   History of hiatal hernia    History of kidney stones    Hyperlipidemia    Hypertension    Malignant ne[place] of upper-inner quadrant of [place] breast [place] 08/17/2011   Left, T2 (2.3 cm) N0, triple negative. Chemotherapy/post wide excision whole breast radiation.   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    Personal history of chemotherapy 2013   LEFT BREAST   Personal history of malignant neoplasm of breast    Personal history of radiation therapy 2017  Rt, 2013 had on left   Stroke ([place]) 09/06/2020   1 cm acute infarction in the right parietal deep and subcortical white matter   Vertigo     PAST SURGICAL HISTORY :   Past Surgical History:  Procedure Laterality Date   BREAST BIOPSY Left 07/18/2011   +   BREAST BIOPSY Left 09/21/2015   neg   BREAST BIOPSY Left  04/05/2016   neg   BREAST BIOPSY Left 03/20/2019   [place] bx 2 areas /fat necrosis at 9:30 ribbon and 5:30 coil   BREAST EXCISIONAL BIOPSY Right 08/2015   [facility]   BREAST LUMPECTOMY Right 09/30/2015   [facility] WITH MICROPAPILLARY FEATURES AND DCIS/CLEAR MARGINS, NEGATIVE LN   BREAST LUMPECTOMY Left 08/17/2011   BREAST LUMPECTOMY WITH SENTINEL LYMPH NODE BIOPSY Right 09/30/2015   Procedure: BREAST LUMPECTOMY WITH SENTINEL LYMPH NODE BX;  Surgeon: Robert Bellow, MD;  Location: [facility] ORS;  Service: General;  Laterality: Right;   BREAST SURGERY Left 2012   wide excision   BREAST SURGERY Left November 2013   Core biopsy of the upper-outer quadrant showed fat necrosis.   CHOLECYSTECTOMY  2007   COLONOSCOPY  2011   Dr Candace Cruise   COLONOSCOPY WITH PROPOFOL N/A 02/25/2015   Procedure: COLONOSCOPY WITH PROPOFOL;  Surgeon: Hulen Luster, MD;  Location[facility] ENDOSCOPY;  Service: Gastroenterology;  Laterality: N/A;   CYSTOSCOPY WITH STENT PLACEMENT Left 11/05/2020   Procedure: CYSTOSCOPY WITH STENT PLACEMENT;  Surgeon: Abbie Sons, MD;  Location: [facility] ORS;  Service: Urology;  Laterality: Left;   CYSTOSCOPY/URETEROSCOPY/HOLMIUM LASER/STENT PLACEMENT Left 12/07/2020   Procedure: CYSTOSCOPY/URETEROSCOPY/HOLMIUM LASER/STENT PLACEMENT;  Surgeon: Abbie Sons, MD;  Location: [facility] ORS;  Service: Urology;  Laterality: Left;   DILATION AND CURETTAGE OF UTERUS  2004   HERNIA REPAIR Right 80/99/8338   Umbilical/ventral hernia repaired with 6.4 cm Proceed ventral patch   PORT-A-CATH REMOVAL     PORTACATH PLACEMENT  2013   RECURRENT HERNIA N/A 01/07/2008   Recurrent ventral hernia at the umbilicus, laparoscopy, open placement of a large Ultra Pro mesh with trans-fascial sutures.   UPPER GI ENDOSCOPY  2011    FAMILY HISTORY :   Family History  Problem Relation Age of Onset   Lymphoma Father    Ovarian cancer Sister    Colon cancer Brother    Lymphoma Brother    Cancer Other        stomach   Cancer Other         stomach   Breast cancer Neg Hx     SOCIAL HISTORY:   Social History   Tobacco Use   Smoking status: Never   Smokeless tobacco: Never  Vaping Use   Vaping Use: Never used  Substance Use Topics   Alcohol use: No   Drug use: No    ALLERGIES:  has No Known Allergies.  MEDICATIONS:  Current Outpatient Medications  Medication Sig Dispense Refill   ACCU-CHEK AVIVA PLUS test strip CHECK  FASTING  BLOOD  GLUCOSE EVERY DAY 100 strip 11   Accu-Chek Softclix Lancets lancets TEST BLOOD SUGAR EVERY DAY 100 each 11   acetaminophen (TYLENOL) 500 MG tablet Take 500 mg by mouth every 6 (six) hours as needed for mild pain or headache.      atorvastatin (LIPITOR) 40 MG tablet TAKE 1 TABLET EVERY EVENING 90 tablet 0   buPROPion (WELLBUTRIN XL) 150 MG 24 hr tablet Take 1 tablet (150 mg total) by mouth daily. 90 tablet 3   Calcium  Carb-Cholecalciferol 600-800 MG-UNIT TABS Take 1 tablet by mouth daily.     chlorthalidone (HYGROTON) 25 MG tablet Take 1 tablet (25 mg total) by mouth daily. 90 tablet 4   cholecalciferol (VITAMIN D) 1000 units tablet Take 1,000 Units by mouth daily.     clopidogrel (PLAVIX) 75 MG tablet TAKE 1 TABLET EVERY DAY 90 tablet 0   cyanocobalamin 500 MCG tablet Take 500 mcg by mouth daily.     gabapentin (NEURONTIN) 300 MG capsule TAKE 2 CAPSULES AT BEDTIME (Patient taking differently: Take 600 mg by mouth at bedtime.) 180 capsule 3   latanoprost (XALATAN) 0.005 % ophthalmic solution Place 1 drop into both eyes at bedtime.      letrozole (FEMARA) 2.5 MG tablet TAKE 1 TABLET (2.5 MG TOTAL) BY MOUTH DAILY. 90 tablet 3   losartan (COZAAR) 50 MG tablet TAKE 1 TABLET (50 MG TOTAL) BY MOUTH DAILY. 90 tablet 0   meclizine (ANTIVERT) 25 MG tablet Take 0.5 tablets (12.5 mg total) by mouth 3 (three) times daily as needed for dizziness. 15 tablet 0   meloxicam (MOBIC) 15 MG tablet TAKE 1 TABLET EVERY DAY 90 tablet 1   metFORMIN (GLUCOPHAGE) 1000 MG tablet TAKE 1 TABLET TWICE DAILY  WITH  A   MEAL 180 tablet 0   metoprolol tartrate (LOPRESSOR) 25 MG tablet TAKE 1/2 TABLET TWICE DAILY 90 tablet 0   Multiple Vitamin (MULTIVITAMIN WITH MINERALS) TABS tablet Take 1 tablet by mouth daily.     omega-3 acid ethyl esters (LOVAZA) 1 g capsule Take 1 g by mouth daily.      pantoprazole (PROTONIX) 40 MG tablet TAKE 1 TABLET TWICE DAILY 180 tablet 1   pyridoxine (B-6) 100 MG tablet Take 100 mg by mouth daily.     sertraline (ZOLOFT) 100 MG tablet TAKE 1/2 TABLET EVERY DAY 45 tablet 4   vitamin C (ASCORBIC ACID) 500 MG tablet Take 500 mg by mouth daily.     vitamin E 400 UNIT capsule Take 400 Units by mouth daily.     No current facility-administered medications for this visit.    PHYSICAL EXAMINATION: ECOG PERFORMANCE STATUS: 0 - Asymptomatic  BP 121/65 (BP Location: Right Arm, Patient Position: Sitting, Cuff Size: Normal)    Pulse 87    Temp (!) 97 F (36.1 C) (Tympanic)    Wt 180 lb 12.8 oz (82 kg)    SpO2 100%    BMI 29.18 kg/m   Filed Weights   08/05/21 1442  Weight: 180 lb 12.8 oz (82 kg)   Physical Exam HENT:     Head: Normocephalic and atraumatic.     Mouth/Throat:     Pharynx: No oropharyngeal exudate.  Eyes:     Pupils: Pupils are equal, round, and reactive to light.  Cardiovascular:     Rate and Rhythm: Normal rate and regular rhythm.  Pulmonary:     Effort: No respiratory distress.     Breath sounds: No wheezing.  Abdominal:     General: Bowel sounds are normal. There is no distension.     Palpations: Abdomen is soft. There is no mass.     Tenderness: There is no abdominal tenderness. There is no guarding or rebound.  Musculoskeletal:        General: No tenderness. Normal range of motion.     Cervical back: Normal range of motion and neck supple.  Skin:    General: Skin is warm.  Neurological:     Mental Status: She is alert  and oriented to person, place, and time.  Psychiatric:        Mood and Affect: Affect normal.     LABORATORY DATA:  I have  reviewed the data as listed    Component Value Date/Time   NA 136 08/05/2021 1343   NA 140 11/12/2020 1340   NA 140 12/23/2012 0949   K 3.8 08/05/2021 1343   K 4.7 12/23/2012 0949   CL 98 08/05/2021 1343   CL 101 12/23/2012 0949   CO2 26 08/05/2021 1343   CO2 30 12/23/2012 0949   GLUCOSE 109 (H) 08/05/2021 1343   GLUCOSE 168 (H) 12/23/2012 0949   BUN 21 08/05/2021 1343   BUN 11 11/12/2020 1340   BUN 9 12/23/2012 0949   CREATININE 1.11 (H) 08/05/2021 1343   CREATININE 0.92 09/04/2014 0916   CALCIUM 9.3 08/05/2021 1343   CALCIUM 9.8 12/23/2012 0949   PROT 7.7 08/05/2021 1343   PROT 7.6 11/12/2020 1340   PROT 7.4 09/04/2014 0916   ALBUMIN 4.3 08/05/2021 1343   ALBUMIN 4.0 11/12/2020 1340   ALBUMIN 3.7 09/04/2014 0916   AST 19 08/05/2021 1343   AST 21 09/04/2014 0916   ALT 16 08/05/2021 1343   ALT 34 09/04/2014 0916   ALKPHOS 67 08/05/2021 1343   ALKPHOS 85 09/04/2014 0916   BILITOT 0.2 (L) 08/05/2021 1343   BILITOT 0.3 11/12/2020 1340   BILITOT 0.4 09/04/2014 0916   GFRNONAA 51 (L) 08/05/2021 1343   GFRNONAA >60 09/04/2014 0916   GFRNONAA >60 02/18/2014 1048   GFRAA 83 05/31/2020 1430   GFRAA >60 09/04/2014 0916   GFRAA >60 02/18/2014 1048    No results found for: SPEP, UPEP  Lab Results  Component Value Date   WBC 7.0 08/05/2021   NEUTROABS 3.8 08/05/2021   HGB 13.5 08/05/2021   HCT 39.8 08/05/2021   MCV 88.2 08/05/2021   PLT 249 08/05/2021      Chemistry      Component Value Date/Time   NA 136 08/05/2021 1343   NA 140 11/12/2020 1340   NA 140 12/23/2012 0949   K 3.8 08/05/2021 1343   K 4.7 12/23/2012 0949   CL 98 08/05/2021 1343   CL 101 12/23/2012 0949   CO2 26 08/05/2021 1343   CO2 30 12/23/2012 0949   BUN 21 08/05/2021 1343   BUN 11 11/12/2020 1340   BUN 9 12/23/2012 0949   CREATININE 1.11 (H) 08/05/2021 1343   CREATININE 0.92 09/04/2014 0916      Component Value Date/Time   CALCIUM 9.3 08/05/2021 1343   CALCIUM 9.8 12/23/2012 0949    ALKPHOS 67 08/05/2021 1343   ALKPHOS 85 09/04/2014 0916   AST 19 08/05/2021 1343   AST 21 09/04/2014 0916   ALT 16 08/05/2021 1343   ALT 34 09/04/2014 0916   BILITOT 0.2 (L) 08/05/2021 1343   BILITOT 0.3 11/12/2020 1340   BILITOT 0.4 09/04/2014 0916       ASSESSMENT & PLAN:   Carcinoma of upper-outer quadrant of right breast in female, estrogen receptor positive ([place]) # 2017 right breast cancer stage I ER/PR positive HER-2/neu negative status post lumpectomy. No chemotherapy/no onotype.G-1 Status post adjuvant radiation.  STABLE. Mammo- AUg 2022- WNL [Dr.Byrnett].   # Currently on Femara; discussed re: Stopping Femara [5 years- dec Feb 2022]- pt anxious wants to continue Femara for now; continue the same. Tolerating well without any side effects. Consider discontinuing at next visit [risk of stroke < 2 %- see below]. Check  breast cancer index to help make decision for the pt.   # BMD: May 2022- T-score of -0.4. continue calcium and vitamin D.  STABLE.  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  No evidence of any malignancy on imaging.  # DISPOSITION:  Follow up in 6 months/labs-cbc/cmp/ca-27-29; -/MD- dr.B       Cammie Sickle, MD 08/05/2021 3:10 PM

## 2021-08-05 NOTE — Assessment & Plan Note (Addendum)
#  2017 right breast cancer stage I ER/PR positive HER-2/neu negative status post lumpectomy. No chemotherapy/no onotype.G-1 Status post adjuvant radiation.  STABLE. Mammo- AUg 2022- WNL [Dr.Byrnett].   # Currently on Femara; discussed re: Stopping Femara [5 years- dec Feb 2022]- pt anxious wants to continue Femara for now; continue the same. Tolerating well without any side effects. Consider discontinuing at next visit [risk of stroke < 2 %- see below]. Check breast cancer index to help make decision for the pt.   # BMD: May 2022- T-score of -0.4. continue calcium and vitamin D.  STABLE.  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  No evidence of any malignancy on imaging.  # DISPOSITION:  Follow up in 6 months/labs-cbc/cmp/ca-27-29; -/MD- dr.B

## 2021-08-05 NOTE — Telephone Encounter (Signed)
Order for breast cancer index on the pathology from 2017/breast surgery completed and faxed.

## 2021-08-06 LAB — CANCER ANTIGEN 27.29: CA 27.29: 14.1 U/mL (ref 0.0–38.6)

## 2021-08-09 ENCOUNTER — Other Ambulatory Visit: Payer: Self-pay

## 2021-08-15 ENCOUNTER — Ambulatory Visit: Payer: Medicare HMO | Admitting: Family Medicine

## 2021-08-18 NOTE — Telephone Encounter (Signed)
Called [facility] [phone]) for status of breast cancer index.  The test started on 1/9 and should result by 1/25.

## 2021-08-22 ENCOUNTER — Ambulatory Visit: Payer: Medicare HMO | Admitting: Family Medicine

## 2021-08-25 DIAGNOSIS — C50411 Malignant neoplasm of upper-outer quadrant of right female breast: Secondary | ICD-10-CM | POA: Diagnosis not present

## 2021-08-26 ENCOUNTER — Encounter: Payer: Self-pay | Admitting: Internal Medicine

## 2021-08-29 ENCOUNTER — Telehealth: Payer: Self-pay

## 2021-08-29 NOTE — Telephone Encounter (Signed)
Copied from [place] [phone]. Topic: General - Other >> Aug 29, 2021  3:52 PM Alanda Slim E wrote: Reason for CRM: Pts daughters FMLA needs to be recertified and she will be faxing this in / and she will pick this up when ready / please advise

## 2021-09-06 ENCOUNTER — Telehealth: Payer: Self-pay | Admitting: Internal Medicine

## 2021-09-06 NOTE — Telephone Encounter (Signed)
On 1/31-spoke with patient and daughter that breast cancer index is low risk of recurrence.  Would not recommend extended AI.  Discontinued AI at this time-especially in the context of strokes.  Follow-up as planned in June, 2023.   FYI

## 2021-09-07 ENCOUNTER — Other Ambulatory Visit: Payer: Self-pay | Admitting: Family Medicine

## 2021-09-07 DIAGNOSIS — K2970 Gastritis, unspecified, without bleeding: Secondary | ICD-10-CM

## 2021-09-07 NOTE — Telephone Encounter (Signed)
Requested Prescriptions  Pending Prescriptions Disp Refills   pantoprazole (PROTONIX) 40 MG tablet [Pharmacy Med Name: PANTOPRAZOLE SODIUM 40 MG Tablet Delayed Release] 180 tablet 1    Sig: TAKE 1 TABLET TWICE DAILY     Gastroenterology: Proton Pump Inhibitors Passed - 09/07/2021 10:29 AM      Passed - Valid encounter within last 12 months    Recent Outpatient Visits          4 months ago Type 2 diabetes mellitus with complication, without long-term current use of insulin [facility])   [facility] Jerrol Banana., MD   8 months ago Type 2 diabetes mellitus with complication, without long-term current use of insulin [facility])   [facility] Jerrol Banana., MD   9 months ago Urinary tract infection without hematuria, site unspecified   [facility] Flinchum, Kelby Aline, FNP   11 months ago Cerebrovascular accident (CVA), unspecified mechanism [facility])   [facility] Jerrol Banana., MD   1 year ago Controlled type 2 diabetes mellitus without complication, without long-term current use of insulin [facility])   [facility] Jerrol Banana., MD      Future Appointments            In 1 month Jerrol Banana., MD [facility] [place]   In 10 months Stoioff, Ronda Fairly, MD [facility]

## 2021-09-15 ENCOUNTER — Telehealth: Payer: Self-pay | Admitting: Urology

## 2021-09-15 NOTE — Telephone Encounter (Signed)
Metabolic stone evaluation results were obtained from the lab and reviewed.  She had low urine output at 1.4 L.  The most significant thing she can do to prevent recurrent stone formation would be to increase her water intake to keep the urine output >2.5 L/day.  Drinking 3 quarts of water per day is usually enough to accomplish this urine volume.  Urinary citrate levels were also low and would recommend starting potassium citrate or LithoLyte.  If she desires LithoLyte can pick up samples and an order form.  We will send in Rx potassiums citrate if desired.  Follow-up as scheduled

## 2021-09-28 ENCOUNTER — Telehealth: Payer: Self-pay

## 2021-09-28 NOTE — Progress Notes (Signed)
Chronic Care Management Pharmacy Assistant   Name: Leah Schaefer  MRN: [id number] DOB: 05/21/1941  Reason for Encounter: Diabetes Disease State Call   Recent office visits:  None ID  Recent consult visits:  08/05/2021 [name], MD (Oncology) for Carcinoma- No medication changes noted, lab orders placed, patient to follow-up in 6 months  Hospital visits:  None in previous 6 months  Medications: Outpatient Encounter Medications as of 09/28/2021  Medication Sig   ACCU-CHEK AVIVA PLUS test strip CHECK  FASTING  BLOOD  GLUCOSE EVERY DAY   Accu-Chek Softclix Lancets lancets TEST BLOOD SUGAR EVERY DAY   acetaminophen (TYLENOL) 500 MG tablet Take 500 mg by mouth every 6 (six) hours as needed for mild pain or headache.    atorvastatin (LIPITOR) 40 MG tablet TAKE 1 TABLET EVERY EVENING   buPROPion (WELLBUTRIN XL) 150 MG 24 hr tablet Take 1 tablet (150 mg total) by mouth daily.   Calcium Carb-Cholecalciferol 600-800 MG-UNIT TABS Take 1 tablet by mouth daily.   chlorthalidone (HYGROTON) 25 MG tablet Take 1 tablet (25 mg total) by mouth daily.   cholecalciferol (VITAMIN D) 1000 units tablet Take 1,000 Units by mouth daily.   clopidogrel (PLAVIX) 75 MG tablet TAKE 1 TABLET EVERY DAY   cyanocobalamin 500 MCG tablet Take 500 mcg by mouth daily.   gabapentin (NEURONTIN) 300 MG capsule TAKE 2 CAPSULES AT BEDTIME (Patient taking differently: Take 600 mg by mouth at bedtime.)   latanoprost (XALATAN) 0.005 % ophthalmic solution Place 1 drop into both eyes at bedtime.    letrozole (FEMARA) 2.5 MG tablet TAKE 1 TABLET (2.5 MG TOTAL) BY MOUTH DAILY.   losartan (COZAAR) 50 MG tablet TAKE 1 TABLET (50 MG TOTAL) BY MOUTH DAILY.   meclizine (ANTIVERT) 25 MG tablet Take 0.5 tablets (12.5 mg total) by mouth 3 (three) times daily as needed for dizziness.   meloxicam (MOBIC) 15 MG tablet TAKE 1 TABLET EVERY DAY   metFORMIN (GLUCOPHAGE) 1000 MG tablet TAKE 1 TABLET TWICE DAILY  WITH  A  MEAL    metoprolol tartrate (LOPRESSOR) 25 MG tablet TAKE 1/2 TABLET TWICE DAILY   Multiple Vitamin (MULTIVITAMIN WITH MINERALS) TABS tablet Take 1 tablet by mouth daily.   omega-3 acid ethyl esters (LOVAZA) 1 g capsule Take 1 g by mouth daily.    pantoprazole (PROTONIX) 40 MG tablet TAKE 1 TABLET TWICE DAILY   pyridoxine (B-6) 100 MG tablet Take 100 mg by mouth daily.   sertraline (ZOLOFT) 100 MG tablet TAKE 1/2 TABLET EVERY DAY   vitamin C (ASCORBIC ACID) 500 MG tablet Take 500 mg by mouth daily.   vitamin E 400 UNIT capsule Take 400 Units by mouth daily.   No facility-administered encounter medications on file as of 09/28/2021.   Care Gaps: Hepatitis C Screening [place] Vaccine Diabetic Foot Exam Diabetic Eye Exam COVID-19 Vaccine Booster 4 Tetanus/TDAP  Star Rating Drugs: Atorvastatin 40 mg last filled on 08/04/2021 for a 90-Day supply with [place] Losartan 50 mg last filled on 07/27/2021 for a 90-Day supply with [facility] Metformin 1000 mg last filled on 08/30/2021 for a 90-Day supply with [place]: Lab Results  Component Value Date/Time   HGBA1C 7.2 (A) 04/12/2021 03:09 PM   HGBA1C 7.5 (A) 12/13/2020 03:42 PM   HGBA1C 7.6 (H) 09/07/2020 06:41 AM   HGBA1C 7.0 (H) 07/07/2020 08:57 AM   MICROALBUR 100 04/23/2017 09:31 AM   MICROALBUR Negative 03/30/2015 02:56 PM  Kidney Function Lab Results  Component Value Date/Time   CREATININE 1.11 (H) 08/05/2021 01:43 PM   CREATININE 0.84 01/19/2021 09:09 AM   CREATININE 0.92 09/04/2014 09:16 AM   CREATININE 0.89 02/18/2014 10:48 AM   GFRNONAA 51 (L) 08/05/2021 01:43 PM   GFRNONAA >60 09/04/2014 09:16 AM   GFRNONAA >60 02/18/2014 10:48 AM   GFRAA 83 05/31/2020 02:30 PM   GFRAA >60 09/04/2014 09:16 AM   GFRAA >60 02/18/2014 10:48 AM    Current antihyperglycemic regimen:  Metformin 1000 mg twice daily  What recent interventions/DTPs have been made to improve glycemic control:  None  ID  Have there been any recent hospitalizations or ED visits since last visit with CPP? No  Patient denies hypoglycemic symptoms, including Pale, Sweaty, Shaky, Hungry, Nervous/irritable, and Vision changes  Patient denies hyperglycemic symptoms, including blurry vision, excessive thirst, fatigue, polyuria, and weakness  How often are you checking your blood sugar? once daily  What are your blood sugars ranging?  Fasting: this morning it was 154  During the week, how often does your blood glucose drop below 70? Never Are you checking your feet daily/regularly? Yes  Adherence Review: Is the patient currently on a STATIN medication? Yes Is the patient currently on ACE/ARB medication? Yes Does the patient have >5 day gap between last estimated fill dates? No  I spoke with the patient, and she reports that she is doing well. Patient denies any ill symptoms today. Patient reports taking all medications as directed with no issues and denies needing any refills today. Patient stated she has a follow-up with PCP next month. Patient has no concerns or issues today.   Patient has a telephone appointment with [name], CPP on 12/19/2021 @1500   [name], [place] Clinical Pharmacist Assistant Phone: [phone]

## 2021-10-10 ENCOUNTER — Other Ambulatory Visit: Payer: Self-pay | Admitting: Family Medicine

## 2021-10-10 DIAGNOSIS — I10 Essential (primary) hypertension: Secondary | ICD-10-CM

## 2021-10-13 ENCOUNTER — Encounter: Payer: Self-pay | Admitting: Family Medicine

## 2021-10-13 ENCOUNTER — Other Ambulatory Visit: Payer: Self-pay

## 2021-10-13 ENCOUNTER — Ambulatory Visit (INDEPENDENT_AMBULATORY_CARE_PROVIDER_SITE_OTHER): Payer: Medicare HMO | Admitting: Family Medicine

## 2021-10-13 VITALS — BP 142/84 | HR 87 | Temp 97.9°F | Resp 18 | Ht 66.0 in | Wt 180.0 lb

## 2021-10-13 DIAGNOSIS — I779 Disorder of arteries and arterioles, unspecified: Secondary | ICD-10-CM

## 2021-10-13 DIAGNOSIS — F339 Major depressive disorder, recurrent, unspecified: Secondary | ICD-10-CM | POA: Diagnosis not present

## 2021-10-13 DIAGNOSIS — I639 Cerebral infarction, unspecified: Secondary | ICD-10-CM

## 2021-10-13 DIAGNOSIS — I10 Essential (primary) hypertension: Secondary | ICD-10-CM | POA: Diagnosis not present

## 2021-10-13 DIAGNOSIS — E1169 Type 2 diabetes mellitus with other specified complication: Secondary | ICD-10-CM | POA: Diagnosis not present

## 2021-10-13 DIAGNOSIS — E785 Hyperlipidemia, unspecified: Secondary | ICD-10-CM

## 2021-10-13 DIAGNOSIS — E119 Type 2 diabetes mellitus without complications: Secondary | ICD-10-CM | POA: Diagnosis not present

## 2021-10-13 MED ORDER — BUPROPION HCL ER (XL) 300 MG PO TB24: 300.0000 mg | ORAL_TABLET | Freq: Every day | ORAL | 3 refills | Status: DC

## 2021-10-13 NOTE — Progress Notes (Signed)
Established patient visit  I,[name],acting as a scribe for [name], MD.,have documented all relevant documentation on the behalf of [name], MD,as directed by  [name], MD while in the presence of [name], MD.   Patient: Leah Schaefer   DOB: [birth year]   80 y.o. Female  MRN: [id number] Visit Date: 10/13/2021  Today's healthcare provider: [name], MD   Chief Complaint  Patient presents with   Follow-up   Hypertension   Diabetes   Hyperlipidemia   Subjective    HPI  Patient is brought in by her daughter for a follow-up.  He is little stronger but still wants to sleep a lot of the day away.  She continues to have anhedonia but her mood is better.  She does not really look forward to anything. She is very inactive at home and her daughter has trouble motivating her to get up away from the TV.  She continues to have cognitive challenges. Diabetes Mellitus Type II, follow-up  Lab Results  Component Value Date   HGBA1C 7.2 (A) 04/12/2021   HGBA1C 7.5 (A) 12/13/2020   HGBA1C 7.6 (H) 09/07/2020   Last seen for diabetes 6 months ago.  Management since then includes continuing the same treatment. She reports good compliance with treatment. She is not having side effects. none  Home blood sugar records: fasting range: 120-171  Episodes of hypoglycemia? No none   Current insulin regiment: n/a Most Recent Eye Exam: past due  --------------------------------------------------------------------------------------------------- Hypertension, follow-up  BP Readings from Last 3 Encounters:  10/13/21 (!) 142/84  08/05/21 121/65  07/15/21 140/82   Wt Readings from Last 3 Encounters:  10/13/21 180 lb (81.6 kg)  08/05/21 180 lb 12.8 oz (82 kg)  07/15/21 190 lb (86.2 kg)     She was last seen for hypertension 6 months ago.  BP at that visit was 133/90. Management since that visit includes; On losartan and metoprolol  and chlorthalidone. She reports good compliance with treatment. She is not having side effects. none She is not exercising. She is adherent to low salt diet.   Outside blood pressures are 118/88.  She does not smoke.  Use of agents associated with hypertension: none.   --------------------------------------------------------------------------------------------------- Lipid/Cholesterol, follow-up  Last Lipid Panel: Lab Results  Component Value Date   CHOL 105 09/07/2020   LDLCALC 57 09/07/2020   HDL 30 (L) 09/07/2020   TRIG 89 09/07/2020    She was last seen for this 13 months ago.  Management since that visit includes; on atorvastatin.  She reports good compliance with treatment. She is not having side effects. none  She is following a Regular diet. Current exercise: none  Last metabolic panel Lab Results  Component Value Date   GLUCOSE 109 (H) 08/05/2021   NA 136 08/05/2021   K 3.8 08/05/2021   BUN 21 08/05/2021   CREATININE 1.11 (H) 08/05/2021   EGFR 58 (L) 11/12/2020   GFRNONAA 51 (L) 08/05/2021   CALCIUM 9.3 08/05/2021   AST 19 08/05/2021   ALT 16 08/05/2021   The ASCVD Risk score (Arnett DK, et al., 2019) failed to calculate for the following reasons:   The patient has a prior MI or stroke diagnosis  ---------------------------------------------------------------------------------------------------   Medications: Outpatient Medications Prior to Visit  Medication Sig   ACCU-CHEK AVIVA PLUS test strip CHECK  FASTING  BLOOD  GLUCOSE EVERY DAY   Accu-Chek Softclix Lancets lancets TEST BLOOD SUGAR EVERY  DAY   acetaminophen (TYLENOL) 500 MG tablet Take 500 mg by mouth every 6 (six) hours as needed for mild pain or headache.    atorvastatin (LIPITOR) 40 MG tablet TAKE 1 TABLET EVERY EVENING   buPROPion (WELLBUTRIN XL) 150 MG 24 hr tablet TAKE 1 TABLET (150 MG TOTAL) BY MOUTH DAILY.   Calcium Carb-Cholecalciferol 600-800 MG-UNIT TABS Take 1 tablet by mouth  daily.   chlorthalidone (HYGROTON) 25 MG tablet Take 1 tablet (25 mg total) by mouth daily.   cholecalciferol (VITAMIN D) 1000 units tablet Take 1,000 Units by mouth daily.   clopidogrel (PLAVIX) 75 MG tablet TAKE 1 TABLET EVERY DAY   cyanocobalamin 500 MCG tablet Take 500 mcg by mouth daily.   gabapentin (NEURONTIN) 300 MG capsule TAKE 2 CAPSULES AT BEDTIME (Patient taking differently: Take 600 mg by mouth at bedtime.)   latanoprost (XALATAN) 0.005 % ophthalmic solution Place 1 drop into both eyes at bedtime.    letrozole (FEMARA) 2.5 MG tablet TAKE 1 TABLET (2.5 MG TOTAL) BY MOUTH DAILY.   losartan (COZAAR) 50 MG tablet TAKE 1 TABLET (50 MG TOTAL) DAILY.   meclizine (ANTIVERT) 25 MG tablet Take 0.5 tablets (12.5 mg total) by mouth 3 (three) times daily as needed for dizziness.   meloxicam (MOBIC) 15 MG tablet TAKE 1 TABLET EVERY DAY   metFORMIN (GLUCOPHAGE) 1000 MG tablet TAKE 1 TABLET TWICE DAILY  WITH  A  MEAL   metoprolol tartrate (LOPRESSOR) 25 MG tablet TAKE 1/2 TABLET TWICE DAILY   Multiple Vitamin (MULTIVITAMIN WITH MINERALS) TABS tablet Take 1 tablet by mouth daily.   omega-3 acid ethyl esters (LOVAZA) 1 g capsule Take 1 g by mouth daily.    pantoprazole (PROTONIX) 40 MG tablet TAKE 1 TABLET TWICE DAILY   pyridoxine (B-6) 100 MG tablet Take 100 mg by mouth daily.   sertraline (ZOLOFT) 100 MG tablet TAKE 1/2 TABLET EVERY DAY   vitamin C (ASCORBIC ACID) 500 MG tablet Take 500 mg by mouth daily.   vitamin E 400 UNIT capsule Take 400 Units by mouth daily.   No facility-administered medications prior to visit.    Review of Systems  Constitutional:  Negative for appetite change, chills, fatigue and fever.  Respiratory:  Negative for chest tightness and shortness of breath.   Cardiovascular:  Negative for chest pain and palpitations.  Gastrointestinal:  Negative for abdominal pain, nausea and vomiting.  Neurological:  Negative for dizziness and weakness.   Last hemoglobin A1c Lab  Results  Component Value Date   HGBA1C 7.8 (H) 10/14/2021       Objective    BP (!) 142/84 (BP Location: Right Arm, Patient Position: Sitting, Cuff Size: Large)    Pulse 87    Temp 97.9 F (36.6 C) (Temporal)    Resp 18    Ht '5\' 6"'  (1.676 m)    Wt 180 lb (81.6 kg)    SpO2 94%    BMI 29.05 kg/m  BP Readings from Last 3 Encounters:  10/13/21 (!) 142/84  08/05/21 121/65  07/15/21 140/82   Wt Readings from Last 3 Encounters:  10/13/21 180 lb (81.6 kg)  08/05/21 180 lb 12.8 oz (82 kg)  07/15/21 190 lb (86.2 kg)      Physical Exam Vitals reviewed.  Constitutional:      Appearance: She is well-developed. She is obese.  HENT:     Head: Normocephalic and atraumatic.     Right Ear: External ear normal.     Left Ear:  External ear normal.     Nose: Nose normal.  Eyes:     General: No scleral icterus. Neck:     Thyroid: No thyromegaly.  Cardiovascular:     Rate and Rhythm: Normal rate and regular rhythm.     Heart sounds: Normal heart sounds.  Pulmonary:     Effort: Pulmonary effort is normal.     Breath sounds: Normal breath sounds.  Abdominal:     Palpations: Abdomen is soft.  Skin:    General: Skin is warm and dry.  Neurological:     Mental Status: She is alert. Mental status is at baseline.  Psychiatric:        Mood and Affect: Mood normal.        Behavior: Behavior normal.        Thought Content: Thought content normal.        Judgment: Judgment normal.      No results found for any visits on 10/13/21.  Assessment & Plan     1. Essential (primary) hypertension Good blood pressure control - Lipid panel - TSH - CBC w/Diff/Platelet - Comprehensive Metabolic Panel (CMET)  2. Type 2 diabetes mellitus without complication, without long-term current use of insulin (HCC) Goal A1c less than 8 but really want to avoid hypoglycemia - Lipid panel - TSH - CBC w/Diff/Platelet - Comprehensive Metabolic Panel (CMET) - Hemoglobin A1c  3. Hyperlipidemia associated with  type 2 diabetes mellitus (HCC) On atorvastatin 80 - Lipid panel - TSH - CBC w/Diff/Platelet - Comprehensive Metabolic Panel (CMET)  4. Carotid artery disease, unspecified laterality, unspecified type ([place]) All risk factors treated. - Lipid panel - TSH - CBC w/Diff/Platelet - Comprehensive Metabolic Panel (CMET)  5. Cerebrovascular accident (CVA), unspecified mechanism ([place]) Clinically patient is stabilized.  Risk factors treated.  Followed by neurology. She has some cognitive decline on top of everything else. - Lipid panel - TSH - CBC w/Diff/Platelet - Comprehensive Metabolic Panel (CMET)  6. Episode of recurrent major depressive disorder, unspecified depression episode severity (HCC) Increase bupropion XL to 150 to 300 mg daily follow-up in 1 to 3 months - buPROPion (WELLBUTRIN XL) 300 MG 24 hr tablet; Take 1 tablet (300 mg total) by mouth daily.  Dispense: 90 tablet; Refill: 3   No follow-ups on file.      I, [name], MD, have reviewed all documentation for this visit. The documentation on 10/17/21 for the exam, diagnosis, procedures, and orders are all accurate and complete.    Bereket Gernert Cranford Mon, MD  [facility] [phone] (phone) [phone] (fax)  [place]

## 2021-10-14 ENCOUNTER — Telehealth: Payer: Self-pay

## 2021-10-14 DIAGNOSIS — I10 Essential (primary) hypertension: Secondary | ICD-10-CM | POA: Diagnosis not present

## 2021-10-14 DIAGNOSIS — E1169 Type 2 diabetes mellitus with other specified complication: Secondary | ICD-10-CM | POA: Diagnosis not present

## 2021-10-14 DIAGNOSIS — E785 Hyperlipidemia, unspecified: Secondary | ICD-10-CM | POA: Diagnosis not present

## 2021-10-14 DIAGNOSIS — E119 Type 2 diabetes mellitus without complications: Secondary | ICD-10-CM | POA: Diagnosis not present

## 2021-10-14 DIAGNOSIS — I779 Disorder of arteries and arterioles, unspecified: Secondary | ICD-10-CM | POA: Diagnosis not present

## 2021-10-14 DIAGNOSIS — I639 Cerebral infarction, unspecified: Secondary | ICD-10-CM | POA: Diagnosis not present

## 2021-10-14 NOTE — Telephone Encounter (Signed)
Please advise 

## 2021-10-14 NOTE — Telephone Encounter (Signed)
Patient got a letter today to serve on jury duty.  She said she is not able to do this.  Can you write her note excusing her from it?  Please let her know ASAP.

## 2021-10-15 LAB — TSH: TSH: 2.11 u[IU]/mL (ref 0.450–4.500)

## 2021-10-15 LAB — COMPREHENSIVE METABOLIC PANEL
ALT: 15 IU/L (ref 0–32)
AST: 19 IU/L (ref 0–40)
Albumin/Globulin Ratio: 1.7 (ref 1.2–2.2)
Albumin: 4.5 g/dL (ref 3.7–4.7)
Alkaline Phosphatase: 68 IU/L (ref 44–121)
BUN/Creatinine Ratio: 13 (ref 12–28)
BUN: 16 mg/dL (ref 8–27)
Bilirubin Total: <0.2 mg/dL (ref 0.0–1.2)
CO2: 24 mmol/L (ref 20–29)
Calcium: 10 mg/dL (ref 8.7–10.3)
Chloride: 99 mmol/L (ref 96–106)
Creatinine, Ser: 1.2 mg/dL — ABNORMAL HIGH (ref 0.57–1.00)
Globulin, Total: 2.7 g/dL (ref 1.5–4.5)
Glucose: 110 mg/dL — ABNORMAL HIGH (ref 70–99)
Potassium: 4.5 mmol/L (ref 3.5–5.2)
Sodium: 139 mmol/L (ref 134–144)
Total Protein: 7.2 g/dL (ref 6.0–8.5)
eGFR: 46 mL/min/{1.73_m2} — ABNORMAL LOW (ref >59)

## 2021-10-15 LAB — CBC WITH DIFFERENTIAL/PLATELET
Basophils Absolute: 0.1 10*3/uL (ref 0.0–0.2)
Basos: 1 %
EOS (ABSOLUTE): 0.2 10*3/uL (ref 0.0–0.4)
Eos: 2 %
Hematocrit: 39.1 % (ref 34.0–46.6)
Hemoglobin: 13.3 g/dL (ref 11.1–15.9)
Immature Grans (Abs): 0 10*3/uL (ref 0.0–0.1)
Immature Granulocytes: 0 %
Lymphocytes Absolute: 2.6 10*3/uL (ref 0.7–3.1)
Lymphs: 33 %
MCH: 29.9 pg (ref 26.6–33.0)
MCHC: 34 g/dL (ref 31.5–35.7)
MCV: 88 fL (ref 79–97)
Monocytes Absolute: 0.6 10*3/uL (ref 0.1–0.9)
Monocytes: 7 %
Neutrophils Absolute: 4.3 10*3/uL (ref 1.4–7.0)
Neutrophils: 57 %
Platelets: 284 10*3/uL (ref 150–450)
RBC: 4.45 x10E6/uL (ref 3.77–5.28)
RDW: 12.8 % (ref 11.7–15.4)
WBC: 7.7 10*3/uL (ref 3.4–10.8)

## 2021-10-15 LAB — LIPID PANEL
Chol/HDL Ratio: 3.3 ratio (ref 0.0–4.4)
Cholesterol, Total: 110 mg/dL (ref 100–199)
HDL: 33 mg/dL — ABNORMAL LOW (ref >39)
LDL Chol Calc (NIH): 51 mg/dL (ref 0–99)
Triglycerides: 151 mg/dL — ABNORMAL HIGH (ref 0–149)
VLDL Cholesterol Cal: 26 mg/dL (ref 5–40)

## 2021-10-15 LAB — HEMOGLOBIN A1C
Est. average glucose Bld gHb Est-mCnc: 177 mg/dL
Hgb A1c MFr Bld: 7.8 % — ABNORMAL HIGH (ref 4.8–5.6)

## 2021-10-19 ENCOUNTER — Encounter: Payer: Self-pay | Admitting: *Deleted

## 2021-10-19 ENCOUNTER — Other Ambulatory Visit: Payer: Self-pay | Admitting: Family Medicine

## 2021-10-19 DIAGNOSIS — I10 Essential (primary) hypertension: Secondary | ICD-10-CM

## 2021-10-19 NOTE — Telephone Encounter (Signed)
Requested Prescriptions  ?Pending Prescriptions Disp Refills  ?? metoprolol tartrate (LOPRESSOR) 25 MG tablet [Pharmacy Med Name: METOPROLOL TARTRATE 25 MG Tablet] 90 tablet 0  ?  Sig: TAKE 1/2 TABLET TWICE DAILY  ?  ? Cardiovascular:  Beta Blockers Failed - 10/19/2021 12:03 PM  ?  ?  Failed - Last BP in normal range  ?  BP Readings from Last 1 Encounters:  ?10/13/21 (!) 142/84  ?   ?  ?  Passed - Last Heart Rate in normal range  ?  Pulse Readings from Last 1 Encounters:  ?10/13/21 87  ?   ?  ?  Passed - Valid encounter within last 6 months  ?  Recent Outpatient Visits   ?      ? 6 days ago Essential (primary) hypertension  ? [facility] Jerrol Banana., MD  ? 6 months ago Type 2 diabetes mellitus with complication, without long-term current use of insulin ([place])  ? [facility] Jerrol Banana., MD  ? 10 months ago Type 2 diabetes mellitus with complication, without long-term current use of insulin ([place])  ? [facility] Jerrol Banana., MD  ? 11 months ago Urinary tract infection without hematuria, site unspecified  ? [place], FNP  ? 1 year ago Cerebrovascular accident (CVA), unspecified mechanism ([place])  ? [facility] Jerrol Banana., MD  ?  ?  ?Future Appointments   ?        ? In 3 months Jerrol Banana., MD [facility], [facility]  ? In 9 months Stoioff, Ronda Fairly, MD [place]  ?  ? ?  ?  ?  ?? atorvastatin (LIPITOR) 40 MG tablet [Pharmacy Med Name: ATORVASTATIN CALCIUM 40 MG Tablet] 90 tablet 0  ?  Sig: TAKE 1 TABLET EVERY EVENING  ?  ? Cardiovascular:  Antilipid - Statins Failed - 10/19/2021 12:03 PM  ?  ?  Failed - Lipid Panel in normal range within the last 12 months  ?  Cholesterol, Total  ?Date Value Ref Range Status  ?10/14/2021 110 100 - 199 mg/dL Final  ? ?LDL Chol Calc (NIH)  ?Date Value Ref Range Status  ?10/14/2021 51 0 - 99 mg/dL Final  ? ?HDL   ?Date Value Ref Range Status  ?10/14/2021 33 (L) >39 mg/dL Final  ? ?Triglycerides  ?Date Value Ref Range Status  ?10/14/2021 151 (H) 0 - 149 mg/dL Final  ? ?  ?  ?  Passed - Patient is not pregnant  ?  ?  Passed - Valid encounter within last 12 months  ?  Recent Outpatient Visits   ?      ? 6 days ago Essential (primary) hypertension  ? [facility] Jerrol Banana., MD  ? 6 months ago Type 2 diabetes mellitus with complication, without long-term current use of insulin ([place])  ? [facility] Jerrol Banana., MD  ? 10 months ago Type 2 diabetes mellitus with complication, without long-term current use of insulin ([place])  ? [facility] Jerrol Banana., MD  ? 11 months ago Urinary tract infection without hematuria, site unspecified  ? [place], FNP  ? 1 year ago Cerebrovascular accident (CVA), unspecified mechanism ([place])  ? [facility] Jerrol Banana., MD  ?  ?  ?Future Appointments   ?        ?  In 3 months Jerrol Banana., MD [facility], [facility]  ? In 9 months Stoioff, Ronda Fairly, MD [place]  ?  ? ?  ?  ?  ? ? ?

## 2021-10-19 NOTE — Telephone Encounter (Signed)
Called pt for date and time of jury request. No answer and no vm. Okay for pec to get info from pt. Thanks. ?

## 2021-10-20 NOTE — Telephone Encounter (Signed)
Noted  

## 2021-10-20 NOTE — Telephone Encounter (Signed)
Daughter Ashok Cordia called and states the information you need is w/ the pt at her home.  She is going to bring the jury duty letter by the office tomorrow, Friday, March 17. ?

## 2021-10-24 NOTE — Telephone Encounter (Signed)
Letter is ready. Patient was advised. ?

## 2021-10-25 ENCOUNTER — Ambulatory Visit (INDEPENDENT_AMBULATORY_CARE_PROVIDER_SITE_OTHER): Payer: Medicare HMO

## 2021-10-25 VITALS — Ht 66.0 in | Wt 180.0 lb

## 2021-10-25 DIAGNOSIS — Z0000 Encounter for general adult medical examination without abnormal findings: Secondary | ICD-10-CM

## 2021-10-25 NOTE — Patient Instructions (Addendum)
Leah Schaefer , ?Thank you for taking time to come for your Medicare Wellness Visit. I appreciate your ongoing commitment to your health goals. Please review the following plan we discussed and let me know if I can assist you in the future.  ? ?Screening recommendations/referrals: ?Colonoscopy: aged out ?Mammogram: aged out ?Bone Density: 12/30/20 ?Recommended yearly ophthalmology/optometry visit for glaucoma screening and checkup ?Recommended yearly dental visit for hygiene and checkup ? ?Vaccinations: ?Influenza vaccine: 05/11/21 ?Pneumococcal vaccine: 05/13/14 ?Tdap vaccine: 05/05/11, due ?Shingles vaccine: Zostavax 04/27/08   ?Covid-19:10/17/19, 11/11/19, 07/07/20 ? ?Advanced directives: no ? ?Conditions/risks identified: none ? ?Next appointment: Follow up in one year for your annual wellness visit - 10/30/22 @ 3:25pm by phone ? ? ?Preventive Care 32 Years and Older, Female ?Preventive care refers to lifestyle choices and visits with your health care provider that can promote health and wellness. ?What does preventive care include? ?A yearly physical exam. This is also called an annual well check. ?Dental exams once or twice a year. ?Routine eye exams. Ask your health care provider how often you should have your eyes checked. ?Personal lifestyle choices, including: ?Daily care of your teeth and gums. ?Regular physical activity. ?Eating a healthy diet. ?Avoiding tobacco and drug use. ?Limiting alcohol use. ?Practicing safe sex. ?Taking low-dose aspirin every day. ?Taking vitamin and mineral supplements as recommended by your health care provider. ?What happens during an annual well check? ?The services and screenings done by your health care provider during your annual well check will depend on your age, overall health, lifestyle risk factors, and family history of disease. ?Counseling  ?Your health care provider may ask you questions about your: ?Alcohol use. ?Tobacco use. ?Drug use. ?Emotional well-being. ?Home and  relationship well-being. ?Sexual activity. ?Eating habits. ?History of falls. ?Memory and ability to understand (cognition). ?Work and work Statistician. ?Reproductive health. ?Screening  ?You may have the following tests or measurements: ?Height, weight, and BMI. ?Blood pressure. ?Lipid and cholesterol levels. These may be checked every 5 years, or more frequently if you are over 53 years old. ?Skin check. ?Lung cancer screening. You may have this screening every year starting at age 68 if you have a 30-pack-year history of smoking and currently smoke or have quit within the past 15 years. ?Fecal occult blood test (FOBT) of the stool. You may have this test every year starting at age 49. ?Flexible sigmoidoscopy or colonoscopy. You may have a sigmoidoscopy every 5 years or a colonoscopy every 10 years starting at age 42. ?Hepatitis C blood test. ?Hepatitis B blood test. ?Sexually transmitted disease (STD) testing. ?Diabetes screening. This is done by checking your blood sugar (glucose) after you have not eaten for a while (fasting). You may have this done every 1-3 years. ?Bone density scan. This is done to screen for osteoporosis. You may have this done starting at age 20. ?Mammogram. This may be done every 1-2 years. Talk to your health care provider about how often you should have regular mammograms. ?Talk with your health care provider about your test results, treatment options, and if necessary, the need for more tests. ?Vaccines  ?Your health care provider may recommend certain vaccines, such as: ?Influenza vaccine. This is recommended every year. ?Tetanus, diphtheria, and acellular pertussis (Tdap, Td) vaccine. You may need a Td booster every 10 years. ?Zoster vaccine. You may need this after age 67. ?Pneumococcal 13-valent conjugate (PCV13) vaccine. One dose is recommended after age 23. ?Pneumococcal polysaccharide (PPSV23) vaccine. One dose is recommended after age 41. ?Talk  to your health care provider  about which screenings and vaccines you need and how often you need them. ?This information is not intended to replace advice given to you by your health care provider. Make sure you discuss any questions you have with your health care provider. ?Document Released: 08/20/2015 Document Revised: 04/12/2016 Document Reviewed: 05/25/2015 ?Elsevier Interactive Patient Education ? 2017 [place]. ? ?Fall Prevention in the Home ?Falls can cause injuries. They can happen to people of all ages. There are many things you can do to make your home safe and to help prevent falls. ?What can I do on the outside of my home? ?Regularly fix the edges of walkways and driveways and fix any cracks. ?Remove anything that might make you trip as you walk through a door, such as a raised step or threshold. ?Trim any bushes or trees on the path to your home. ?Use bright outdoor lighting. ?Clear any walking paths of anything that might make someone trip, such as rocks or tools. ?Regularly check to see if handrails are loose or broken. Make sure that both sides of any steps have handrails. ?Any raised decks and porches should have guardrails on the edges. ?Have any leaves, snow, or ice cleared regularly. ?Use sand or salt on walking paths during winter. ?Clean up any spills in your garage right away. This includes oil or grease spills. ?What can I do in the bathroom? ?Use night lights. ?Install grab bars by the toilet and in the tub and shower. Do not use towel bars as grab bars. ?Use non-skid mats or decals in the tub or shower. ?If you need to sit down in the shower, use a plastic, non-slip stool. ?Keep the floor dry. Clean up any water that spills on the floor as soon as it happens. ?Remove soap buildup in the tub or shower regularly. ?Attach bath mats securely with double-sided non-slip rug tape. ?Do not have throw rugs and other things on the floor that can make you trip. ?What can I do in the bedroom? ?Use night lights. ?Make sure  that you have a light by your bed that is easy to reach. ?Do not use any sheets or blankets that are too big for your bed. They should not hang down onto the floor. ?Have a firm chair that has side arms. You can use this for support while you get dressed. ?Do not have throw rugs and other things on the floor that can make you trip. ?What can I do in the kitchen? ?Clean up any spills right away. ?Avoid walking on wet floors. ?Keep items that you use a lot in easy-to-reach places. ?If you need to reach something above you, use a strong step stool that has a grab bar. ?Keep electrical cords out of the way. ?Do not use floor polish or wax that makes floors slippery. If you must use wax, use non-skid floor wax. ?Do not have throw rugs and other things on the floor that can make you trip. ?What can I do with my stairs? ?Do not leave any items on the stairs. ?Make sure that there are handrails on both sides of the stairs and use them. Fix handrails that are broken or loose. Make sure that handrails are as long as the stairways. ?Check any carpeting to make sure that it is firmly attached to the stairs. Fix any carpet that is loose or worn. ?Avoid having throw rugs at the top or bottom of the stairs. If you do have throw rugs,  attach them to the floor with carpet tape. ?Make sure that you have a light switch at the top of the stairs and the bottom of the stairs. If you do not have them, ask someone to add them for you. ?What else can I do to help prevent falls? ?Wear shoes that: ?Do not have high heels. ?Have rubber bottoms. ?Are comfortable and fit you well. ?Are closed at the toe. Do not wear sandals. ?If you use a stepladder: ?Make sure that it is fully opened. Do not climb a closed stepladder. ?Make sure that both sides of the stepladder are locked into place. ?Ask someone to hold it for you, if possible. ?Clearly mark and make sure that you can see: ?Any grab bars or handrails. ?First and last steps. ?Where the edge of  each step is. ?Use tools that help you move around (mobility aids) if they are needed. These include: ?Canes. ?Walkers. ?Scooters. ?Crutches. ?Turn on the lights when you go into a dark area. Replace any light bul

## 2021-10-25 NOTE — Progress Notes (Signed)
Virtual Visit via Telephone Note  I connected with  Leah Schaefer on 10/25/21 at  2:20 PM EDT by telephone and verified that I am speaking with the correct person using two identifiers.  Location: Patient: home Provider: [facility] Persons participating in the virtual visit: [place]   I discussed the limitations, risks, security and privacy concerns of performing an evaluation and management service by telephone and the availability of in person appointments. The patient expressed understanding and agreed to proceed.  Interactive audio and video telecommunications were attempted between this nurse and patient, however failed, due to patient having technical difficulties OR patient did not have access to video capability.  We continued and completed visit with audio only.  Some vital signs may be absent or patient reported.   Dionisio David, LPN  Subjective:   Leah Schaefer is a 81 y.o. female who presents for Medicare Annual (Subsequent) preventive examination.  Review of Systems           Objective:    Today's Vitals   10/25/21 1431  Weight: 180 lb (81.6 kg)  Height: '5\' 6"'  (1.676 m)   Body mass index is 29.05 kg/m.  Advanced Directives 08/05/2021 01/19/2021 12/07/2020 11/29/2020 11/05/2020 09/06/2020 09/05/2020  Does Patient Have a Medical Advance Directive? No - Yes Yes No No No  Type of Advance Directive - - [place];Living will [place] - - -  Does patient want to make changes to medical advance directive? - - No - Patient declined - - - -  Copy of [place] in Chart? - - No - copy requested - - - -  Would patient like information on creating a medical advance directive? No - Patient declined No - Patient declined - - No - Patient declined No - Patient declined No - Patient declined    Current Medications (verified) Outpatient Encounter Medications as of 10/25/2021  Medication Sig   ACCU-CHEK  AVIVA PLUS test strip CHECK  FASTING  BLOOD  GLUCOSE EVERY DAY   Accu-Chek Softclix Lancets lancets TEST BLOOD SUGAR EVERY DAY   acetaminophen (TYLENOL) 500 MG tablet Take 500 mg by mouth every 6 (six) hours as needed for mild pain or headache.    atorvastatin (LIPITOR) 40 MG tablet TAKE 1 TABLET EVERY EVENING   buPROPion (WELLBUTRIN XL) 300 MG 24 hr tablet Take 1 tablet (300 mg total) by mouth daily.   Calcium Carb-Cholecalciferol 600-800 MG-UNIT TABS Take 1 tablet by mouth daily.   chlorthalidone (HYGROTON) 25 MG tablet Take 1 tablet (25 mg total) by mouth daily.   cholecalciferol (VITAMIN D) 1000 units tablet Take 1,000 Units by mouth daily.   clopidogrel (PLAVIX) 75 MG tablet TAKE 1 TABLET EVERY DAY   cyanocobalamin 500 MCG tablet Take 500 mcg by mouth daily.   gabapentin (NEURONTIN) 300 MG capsule TAKE 2 CAPSULES AT BEDTIME (Patient taking differently: Take 600 mg by mouth at bedtime.)   latanoprost (XALATAN) 0.005 % ophthalmic solution Place 1 drop into both eyes at bedtime.    letrozole (FEMARA) 2.5 MG tablet TAKE 1 TABLET (2.5 MG TOTAL) BY MOUTH DAILY.   losartan (COZAAR) 50 MG tablet TAKE 1 TABLET (50 MG TOTAL) DAILY.   meclizine (ANTIVERT) 25 MG tablet Take 0.5 tablets (12.5 mg total) by mouth 3 (three) times daily as needed for dizziness.   meloxicam (MOBIC) 15 MG tablet TAKE 1 TABLET EVERY DAY   metFORMIN (GLUCOPHAGE) 1000 MG tablet TAKE 1 TABLET  TWICE DAILY  WITH  A  MEAL   metoprolol tartrate (LOPRESSOR) 25 MG tablet TAKE 1/2 TABLET TWICE DAILY   Multiple Vitamin (MULTIVITAMIN WITH MINERALS) TABS tablet Take 1 tablet by mouth daily.   omega-3 acid ethyl esters (LOVAZA) 1 g capsule Take 1 g by mouth daily.    pantoprazole (PROTONIX) 40 MG tablet TAKE 1 TABLET TWICE DAILY   pyridoxine (B-6) 100 MG tablet Take 100 mg by mouth daily.   sertraline (ZOLOFT) 100 MG tablet TAKE 1/2 TABLET EVERY DAY   vitamin C (ASCORBIC ACID) 500 MG tablet Take 500 mg by mouth daily.   vitamin E 400  UNIT capsule Take 400 Units by mouth daily.   No facility-administered encounter medications on file as of 10/25/2021.    Allergies (verified) Patient has no known allergies.   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 Chemotherapy/post wide excision whole breast radiation.   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    Personal history of chemotherapy 2013   LEFT BREAST   Personal history of malignant neoplasm of breast    Personal history of radiation therapy 2017    Rt, 2013 had on left   Stroke ([facility] 09/06/2020   1 cm acute infarction in the right parietal deep and subcortical white matter   Vertigo    Past Surgical History:  Procedure Laterality Date   BREAST BIOPSY Left 07/18/2011   +   BREAST BIOPSY Left 09/21/2015   neg   BREAST BIOPSY Left 04/05/2016   neg   BREAST BIOPSY Left 03/20/2019   uKoreabx 2 areas /fat necrosis at 9:30 ribbon and 5:30 coil   [facility]  [facility]2015   [facility]AT[facility] DC[facility]/CLEAR MARGINS, NEGATIVE LN   BREAST LUMPECTOMY Left 08/17/2011   BREAST  LUMPECTOMY WITH SENTINEL LYMPH NODE BIOPSY Right 09/30/2015   Procedure: BREAST LUMPECTOMY WITH SENTINEL LYMPH NODE BX;  Surgeon: JRobert Bellow MD;  Location: [facility] ORS;  Service: General;  Laterality: Right;   BREAST SURGERY Left 2012   wide excision   BREAST SURGERY Left November 2013   Core biopsy of the upper-outer quadrant showed fat necrosis.   CHOLECYSTECTOMY  2007   COLONOSCOPY  2011   Dr OCandace Cruise  COLONOSCOPY WITH PROPOFOL N/A 02/25/2015   Procedure: COLONOSCOPY WITH PROPOFOL;  Surgeon: PHulen Luster MD;  [facility];  Service: Gastroenterology;  Laterality: N/A;   CYSTOSCOPY WITH STENT PLACEMENT Left 11/05/2020  Procedure: CYSTOSCOPY WITH STENT PLACEMENT;  Surgeon: Abbie Sons, MD;  Location: [facility] ORS;  Service: Urology;  Laterality: Left;   CYSTOSCOPY/URETEROSCOPY/HOLMIUM LASER/STENT PLACEMENT Left 12/07/2020   Procedure: CYSTOSCOPY/URETEROSCOPY/HOLMIUM LASER/STENT PLACEMENT;  Surgeon: Abbie Sons, MD;  Location: [facility] ORS;  Service: Urology;  Laterality: Left;   DILATION AND CURETTAGE OF UTERUS  2004   HERNIA REPAIR Right 47/65/4650   Umbilical/ventral hernia repaired with 6.4 cm Proceed ventral patch   PORT-A-CATH REMOVAL     PORTACATH PLACEMENT  2013   RECURRENT HERNIA N/A 01/07/2008   Recurrent ventral hernia at the umbilicus, laparoscopy, open placement of a large Ultra Pro mesh with trans-fascial sutures.   UPPER GI ENDOSCOPY  2011   Family History  Problem Relation Age of Onset   Lymphoma Father    Ovarian cancer Sister    Colon cancer Brother    Lymphoma Brother    Cancer Other        stomach   Cancer Other        stomach   Breast cancer Neg Hx    Social History   Socioeconomic History   Marital status: Widowed    Spouse name: Not on file   Number of children: 2   Years of education: H/S   Highest education level: 12th grade  Occupational History   Occupation: Retired  Tobacco Use   Smoking status: Never   Smokeless tobacco: Never   Vaping Use   Vaping Use: Never used  Substance and Sexual Activity   Alcohol use: No   Drug use: No   Sexual activity: Not on file  Other Topics Concern   Not on file  Social History Narrative   Lives alone   Social Determinants of Health   Financial Resource Strain: Low Risk    Difficulty of Paying Living Expenses: Not hard at all  Food Insecurity: Not on file  Transportation Needs: Not on file  Physical Activity: Not on file  Stress: Not on file  Social Connections: Not on file    Tobacco Counseling Counseling given: Not Answered   Clinical Intake:  Pre-visit preparation completed: Yes  Pain : No/denies pain     Nutritional Risks: None Diabetes: Yes CBG done?: No Did pt. bring in CBG monitor from home?: No  How often do you need to have someone help you when you read instructions, pamphlets, or other written materials from your doctor or pharmacy?: 1 - Never  Diabetic?yes Nutrition Risk Assessment:  Has the patient had any N/V/D within the last 2 months?  Yes  Does the patient have any non-healing wounds?  No  Has the patient had any unintentional weight loss or weight gain?  No   Diabetes:  Is the patient diabetic?  Yes  If diabetic, was a CBG obtained today?  No  Did the patient bring in their glucometer from home?  No  How often do you monitor your CBG's? Every day.   Financial Strains and Diabetes Management:  Are you having any financial strains with the device, your supplies or your medication? No .  Does the patient want to be seen by Chronic Care Management for management of their diabetes?  No  Would the patient like to be referred to a Nutritionist or for Diabetic Management?  No   Diabetic Exams:  Diabetic Eye Exam: Completed 01/09/18. Overdue for diabetic eye exam. Pt has been advised about the importance in completing this exam.   Diabetic Foot Exam: Completed 03/25/17. Pt has been  advised about the importance in completing this exam.    Interpreter Needed?: No  Information entered by :: Kirke Shaggy, LPN   Activities of Daily Living In your present state of health, do you have any difficulty performing the following activities: 11/29/2020 11/06/2020  Hearing? N -  Vision? N -  Difficulty concentrating or making decisions? Y -  Walking or climbing stairs? Y -  Dressing or bathing? - -  Doing errands, shopping? N N  Some recent data might be hidden    Patient Care Team: Jerrol Banana., MD as PCP - General (Family Medicine) Bary Castilla, Forest Gleason, MD (General Surgery) Cammie Sickle, MD as Consulting Physician (Internal Medicine) Lorelee Cover., MD as Consulting Physician (Ophthalmology) Noreene Filbert, MD as Referring Physician (Radiation Oncology) Germaine Pomfret, [facility] as Pharmacist (Pharmacist)  Indicate any recent Medical Services you may have received from other than [facility] providers in the past year (date may be approximate).     Assessment:   This is a routine wellness examination for Leah Schaefer.  Hearing/Vision screen No results found.  Dietary issues and exercise activities discussed:     Goals Addressed   None    Depression Screen PHQ 2/9 Scores 04/12/2021 12/13/2020 07/07/2020 05/03/2020 01/14/2019 01/09/2018 02/20/2017  PHQ - 2 Score '4 2 2 ' 0 2 0 2  PHQ- 9 Score '17 3 3 1 3 ' - 6    Fall Risk Fall Risk  07/07/2020 05/03/2020 04/09/2019 01/14/2019 01/09/2018  Falls in the past year? 0 1 0 0 Yes  Number falls in past yr: 0 1 - - 1  Injury with Fall? 0 0 - - No  Follow up Falls evaluation completed Falls evaluation completed - - Falls prevention discussed    FALL RISK PREVENTION PERTAINING TO THE HOME:  Any stairs in or around the home? No  If so, are there any without handrails? No  Home free of loose throw rugs in walkways, pet beds, electrical cords, etc? Yes  Adequate lighting in your home to reduce risk of falls? Yes   ASSISTIVE DEVICES UTILIZED TO PREVENT FALLS:  Life alert? Yes  Use  of a cane, walker or w/c? Yes  Grab bars in the bathroom? Yes  Shower chair or bench in shower? Yes  Elevated toilet seat or a handicapped toilet? No   Cognitive Function:    6CIT Screen 12/06/2016  What Year? 0 points  What month? 0 points  What time? 0 points  Count back from 20 0 points  Months in reverse 4 points  Repeat phrase 4 points  Total Score 8    Immunizations Immunization History  Administered Date(s) Administered   Fluad Quad(high Dose 65+) 06/24/2019, 05/03/2020, 05/11/2021   Influenza, High Dose Seasonal PF 05/13/2014, 05/29/2015, 04/05/2016, 04/19/2017, 04/11/2018   Influenza,inj,Quad PF,6+ Mos 05/23/2013   PFIZER(Purple Top)SARS-COV-2 Vaccination 10/17/2019, 11/11/2019, 07/07/2020   Pneumococcal Conjugate-13 05/13/2014   Pneumococcal Polysaccharide-23 06/07/2012   Td 04/19/2007, 05/05/2011   Tdap 05/05/2011   Zoster, Live 04/27/2008    TDAP status: Due, Education has been provided regarding the importance of this vaccine. Advised may receive this vaccine at local pharmacy or Health Dept. Aware to provide a copy of the vaccination record if obtained from local pharmacy or Health Dept. Verbalized acceptance and understanding.  Flu Vaccine status: Up to date  Pneumococcal vaccine status: Up to date  Covid-19 vaccine status: Completed vaccines  Qualifies for Shingles Vaccine? Yes   Zostavax completed Yes   Shingrix Completed?: No.  Education has been provided regarding the importance of this vaccine. Patient has been advised to call insurance company to determine out of pocket expense if they have not yet received this vaccine. Advised may also receive vaccine at local pharmacy or Health Dept. Verbalized acceptance and understanding.  Screening Tests Health Maintenance  Topic Date Due   Hepatitis C Screening  Never done   Zoster Vaccines- Shingrix (1 of 2) Never done   FOOT EXAM  03/22/2018   OPHTHALMOLOGY EXAM  01/10/2019   COVID-19 Vaccine (4 -  Booster for Pfizer series) 09/01/2020   TETANUS/TDAP  05/04/2021   HEMOGLOBIN A1C  04/16/2022   Pneumonia Vaccine 52+ Years old  Completed   INFLUENZA VACCINE  Completed   DEXA SCAN  Completed   HPV VACCINES  Aged Out    Health Maintenance  Health Maintenance Due  Topic Date Due   Hepatitis C Screening  Never done   Zoster Vaccines- Shingrix (1 of 2) Never done   FOOT EXAM  03/22/2018   OPHTHALMOLOGY EXAM  01/10/2019   COVID-19 Vaccine (4 - Booster for [place] series) 09/01/2020   TETANUS/TDAP  05/04/2021    Colorectal cancer screening: No longer required.   Mammogram status: No longer required due to age.  Bone Density status: Completed 12/30/20. Results reflect: Bone density results: NORMAL. Repeat every 5 years.- declined referral  Lung Cancer Screening: (Low Dose CT Chest recommended if Age 35-80 years, 30 pack-year currently smoking OR have quit w/in 15years.) does not qualify.    Additional Screening:  Hepatitis C Screening: does not qualify; Completed no  Vision Screening: Recommended annual ophthalmology exams for early detection of glaucoma and other disorders of the eye. Is the patient up to date with their annual eye exam?  Yes  Who is the provider or what is the name of the office in which the patient attends annual eye exams? Dr.Bell If pt is not established with a provider, would they like to be referred to a provider to establish care? No .   Dental Screening: Recommended annual dental exams for proper oral hygiene  Community Resource Referral / Chronic Care Management: CRR required this visit?  No   CCM required this visit?  No      Plan:     I have personally reviewed and noted the following in the patient's chart:   Medical and social history Use of alcohol, tobacco or illicit drugs  Current medications and supplements including opioid prescriptions.  Functional ability and status Nutritional status Physical activity Advanced directives List  of other physicians Hospitalizations, surgeries, and ER visits in previous 12 months Vitals Screenings to include cognitive, depression, and falls Referrals and appointments  In addition, I have reviewed and discussed with patient certain preventive protocols, quality metrics, and best practice recommendations. A written personalized care plan for preventive services as well as general preventive health recommendations were provided to patient.     Dionisio David, LPN   03/04/210   Nurse Notes: none

## 2021-11-09 ENCOUNTER — Telehealth: Payer: Self-pay

## 2021-11-09 NOTE — Progress Notes (Signed)
? ? ?Chronic Care Management ?Pharmacy Assistant  ? ?Name: Leah Schaefer  MRN: [id number] DOB: [birth year] ? ?Reason for Encounter: Diabetes Disease State Call ?  ?Recent office visits:  ?10/25/2021 Kirke Shaggy, LPN (PCP Clinical Support) for Medicare Annual Wellness Exam- No medication changes noted, No orders placed ? ?10/13/2021 [name], MD (PCP Office Visit) for Follow-up- Changed: Increase Bupropion XL from 150 mg daily to 300 mg daily, lab orders placed, patient to follow-up in 1-3 months. ? ?Recent consult visits:  ?None ID ? ?Hospital visits:  ?None in previous 6 months ? ?Medications: ?Outpatient Encounter Medications as of 11/09/2021  ?Medication Sig  ? ACCU-CHEK AVIVA PLUS test strip CHECK  FASTING  BLOOD  GLUCOSE EVERY DAY  ? Accu-Chek Softclix Lancets lancets TEST BLOOD SUGAR EVERY DAY  ? acetaminophen (TYLENOL) 500 MG tablet Take 500 mg by mouth every 6 (six) hours as needed for mild pain or headache.   ? atorvastatin (LIPITOR) 40 MG tablet TAKE 1 TABLET EVERY EVENING  ? buPROPion (WELLBUTRIN XL) 300 MG 24 hr tablet Take 1 tablet (300 mg total) by mouth daily.  ? Calcium Carb-Cholecalciferol 600-800 MG-UNIT TABS Take 1 tablet by mouth daily.  ? chlorthalidone (HYGROTON) 25 MG tablet Take 1 tablet (25 mg total) by mouth daily.  ? cholecalciferol (VITAMIN D) 1000 units tablet Take 1,000 Units by mouth daily.  ? clopidogrel (PLAVIX) 75 MG tablet TAKE 1 TABLET EVERY DAY  ? cyanocobalamin 500 MCG tablet Take 500 mcg by mouth daily.  ? gabapentin (NEURONTIN) 300 MG capsule TAKE 2 CAPSULES AT BEDTIME (Patient not taking: Reported on 10/25/2021)  ? latanoprost (XALATAN) 0.005 % ophthalmic solution Place 1 drop into both eyes at bedtime.   ? letrozole (FEMARA) 2.5 MG tablet TAKE 1 TABLET (2.5 MG TOTAL) BY MOUTH DAILY.  ? losartan (COZAAR) 50 MG tablet TAKE 1 TABLET (50 MG TOTAL) DAILY.  ? meclizine (ANTIVERT) 25 MG tablet Take 0.5 tablets (12.5 mg total) by mouth 3 (three) times daily as needed for  dizziness.  ? meloxicam (MOBIC) 15 MG tablet TAKE 1 TABLET EVERY DAY (Patient not taking: Reported on 10/25/2021)  ? metFORMIN (GLUCOPHAGE) 1000 MG tablet TAKE 1 TABLET TWICE DAILY  WITH  A  MEAL  ? metoprolol tartrate (LOPRESSOR) 25 MG tablet TAKE 1/2 TABLET TWICE DAILY  ? Multiple Vitamin (MULTIVITAMIN WITH MINERALS) TABS tablet Take 1 tablet by mouth daily.  ? omega-3 acid ethyl esters (LOVAZA) 1 g capsule Take 1 g by mouth daily.   ? pantoprazole (PROTONIX) 40 MG tablet TAKE 1 TABLET TWICE DAILY  ? pyridoxine (B-6) 100 MG tablet Take 100 mg by mouth daily.  ? sertraline (ZOLOFT) 100 MG tablet TAKE 1/2 TABLET EVERY DAY (Patient not taking: Reported on 10/25/2021)  ? vitamin C (ASCORBIC ACID) 500 MG tablet Take 500 mg by mouth daily.  ? vitamin E 400 UNIT capsule Take 400 Units by mouth daily.  ? ?No facility-administered encounter medications on file as of 11/09/2021.  ? ?Care Gaps: ?Hepatitis C Screening ?Zoster Vaccines ?Diabetic Foot Exam ?Diabetic Eye Exam ?COVID-19 Vaccine Booster 4 ?Tetanus/TDAP ?BP > 140/90 ? ?Star Rating Drugs: ?Atorvastatin 40 mg last filled on 10/21/2021 for a 90-Day supply with [facility] ?Losartan 50 mg last filled on 10/12/2021 for a 90-Day supply with [facility] ?Metformin 1000 mg last filled on 08/30/2021 for a 90-Day supply with [facility] ? ?Recent Relevant Labs: ?Lab Results  ?Component Value Date/Time  ? HGBA1C 7.8 (H) 10/14/2021 02:48 PM  ? HGBA1C  7.2 (A) 04/12/2021 03:09 PM  ? HGBA1C 7.5 (A) 12/13/2020 03:42 PM  ? HGBA1C 7.6 (H) 09/07/2020 06:41 AM  ? MICROALBUR 100 04/23/2017 09:31 AM  ? MICROALBUR Negative 03/30/2015 02:56 PM  ?  ?Kidney Function ?Lab Results  ?Component Value Date/Time  ? CREATININE 1.20 (H) 10/14/2021 02:48 PM  ? CREATININE 1.11 (H) 08/05/2021 01:43 PM  ? CREATININE 0.92 09/04/2014 09:16 AM  ? CREATININE 0.89 02/18/2014 10:48 AM  ? GFRNONAA 51 (L) 08/05/2021 01:43 PM  ? GFRNONAA >60 09/04/2014 09:16 AM  ? GFRNONAA >60 02/18/2014 10:48 AM  ?  GFRAA 83 05/31/2020 02:30 PM  ? GFRAA >60 09/04/2014 09:16 AM  ? GFRAA >60 02/18/2014 10:48 AM  ? ? ?Current antihyperglycemic regimen:  ?Metformin 1000 mg twice daily ? ?What recent interventions/DTPs have been made to improve glycemic control:  ?None ID ? ?Have there been any recent hospitalizations or ED visits since last visit with CPP? No ?  ?Patient denies hypoglycemic symptoms, including Pale, Sweaty, Shaky, Hungry, Nervous/irritable, and Vision changes ?  ?Patient denies hyperglycemic symptoms, including blurry vision, excessive thirst, fatigue, polyuria, and weakness ?  ?How often are you checking your blood sugar? once daily ?  ?What are your blood sugars ranging?  ?Fasting: this morning it was 120-171 this morning it was 190, but patient stated she had 3 hotdogs last night ? ?During the week, how often does your blood glucose drop below 70? Never ?Are you checking your feet daily/regularly? Yes ?  ?Adherence Review: ?Is the patient currently on a STATIN medication? Yes ?Is the patient currently on ACE/ARB medication? Yes ?Does the patient have >5 day gap between last estimated fill dates? No ? ?I spoke with the patient, and she reports she is doing well today. Patient denies any ill symptoms at this time. The patient stated that since her Bupropion was increased to 300 mg her energy has increased. Patient reports that although her energy has increased in the afternoons she is really groggy in the mornings. The patient reports that she goes to bed around 8:00 and she takes the medication right before bed. I suggested that maybe she could take it an hour prior to her bedtime to see if that helps her not be so groggy in the morning. Patient is taking all medications as directed. Patient has no other concerns or issues at this time. The patient was advised that the CPP would check in next month to see how she is doing, and to see how she has adjusted to the medication by taking it an hour prior to bed.  Patient verbalized understanding.  ? ?Patient has a telephone appointment with [name], CPP on 12/19/2021 @ 1500. ? ?[name], CPA/CMA ?Clinical Pharmacist Assistant ?Phone: [phone]  ? ? ?

## 2021-11-14 ENCOUNTER — Other Ambulatory Visit: Payer: Self-pay | Admitting: Family Medicine

## 2021-11-14 DIAGNOSIS — E119 Type 2 diabetes mellitus without complications: Secondary | ICD-10-CM

## 2021-11-18 ENCOUNTER — Ambulatory Visit: Payer: Self-pay

## 2021-11-18 NOTE — Telephone Encounter (Signed)
?  Chief Complaint: nausea ?Symptoms: nausea only after eating meals  ?Frequency: last Wednesday ?Pertinent Negatives: Patient denies vomiting, diarrhea, or constipation ?Disposition: '[]'$ ED /'[]'$ Urgent Care (no appt availability in office) / '[]'$ Appointment(In office/virtual)/ '[]'$  [facility]/ '[x]'$ Home Care/ '[]'$ Refused Recommended Disposition /'[]'$ [place] [facility]/ '[]'$  Follow-up with PCP ?Additional Notes: I advised pt to eat smaller frequent meals and take Pepto and see if that helps with symptoms. She states last week she ate a whole pack of cheese and unsure if that caused nausea or not. I explained that sometimes too much dairy can upset the stomach and just to watch dairy intake and if symptoms aren't any better Monday to call and schedule an appt.  ? ?Reason for Disposition ? Unexplained nausea ? ?Answer Assessment - Initial Assessment Questions ?1. NAUSEA SEVERITY: "How bad is the nausea?" (e.g., mild, moderate, severe; dehydration, weight loss) ?  - MILD: loss of appetite without change in eating habits ?  - MODERATE: decreased oral intake without significant weight loss, dehydration, or malnutrition ?  - SEVERE: inadequate caloric or fluid intake, significant weight loss, symptoms of dehydration ?    Mild after eating  ?2. ONSET: "When did the nausea begin?" ?    Last week ? ?Protocols used: Nausea-A-AH ? ?

## 2021-12-16 ENCOUNTER — Telehealth: Payer: Self-pay

## 2021-12-16 NOTE — Progress Notes (Signed)
    Chronic Care Management Pharmacy Assistant   Name: Leah Schaefer  MRN: [id number] DOB: 1941/07/23  Patient called to be reminded of her telephone appointment with [name], CPP on 12/19/2021 @ 1500  Patient aware of appointment date, time, and type of appointment (either telephone or in person). Patient aware to have/bring all medications, supplements, blood pressure and/or blood sugar logs to visit.  Star Rating Drug: Atorvastatin 40 mg last filled on 10/21/2021 for a 90-Day supply with [place] Losartan 50 mg last filled on 10/12/2021 for a 90-Day supply with [place] Metformin 1000 mg last filled on 11/15/2021 for a 90-Day supply with [place]  Any gaps in medications fill history?  Care Gaps: Hepatitis C Screening Zoster Vaccines Diabetic Foot Exam Diabetic Eye Exam COVID-19 Vaccine Booster 4 Tetanus/TDAP BP > 140/90   [name], CPA/CMA Clinical Pharmacist Assistant Phone: [phone]

## 2021-12-19 ENCOUNTER — Ambulatory Visit (INDEPENDENT_AMBULATORY_CARE_PROVIDER_SITE_OTHER): Payer: Medicare HMO

## 2021-12-19 DIAGNOSIS — E119 Type 2 diabetes mellitus without complications: Secondary | ICD-10-CM

## 2021-12-19 DIAGNOSIS — I10 Essential (primary) hypertension: Secondary | ICD-10-CM

## 2021-12-19 DIAGNOSIS — F330 Major depressive disorder, recurrent, mild: Secondary | ICD-10-CM

## 2021-12-19 NOTE — Progress Notes (Signed)
Chronic Care Management Pharmacy Note  12/20/2021 Name:  Leah Schaefer MRN:  [id number] DOB:  [birth year]  Summary: Patient presents for CCM follow-up.   Recommendations/Changes made from today's visit: Continue current medications  Plan: CPP follow-up in 6 months.  Subjective: Leah Schaefer is an 81 y.o. year old female who is a primary patient of Jerrol Banana., MD.  The CCM team was consulted for assistance with disease management and care coordination needs.    Engaged with patient by telephone for follow up visit in response to provider referral for pharmacy case management and/or care coordination services.   Consent to Services:  The patient was given information about Chronic Care Management services, agreed to services, and gave verbal consent prior to initiation of services.  Please see initial visit note for detailed documentation.   Patient Care Team: Jerrol Banana., MD as PCP - General (Family Medicine) Bary Castilla, Forest Gleason, MD (General Surgery) Cammie Sickle, MD as Consulting Physician (Internal Medicine) Lorelee Cover., MD as Consulting Physician (Ophthalmology) Noreene Filbert, MD as Referring Physician (Radiation Oncology) Germaine Pomfret, [facility] as Pharmacist (Pharmacist)  Recent office visits: 10/13/21: Patient presented to Dr. Rosanna Randy for follow-up. Wellbutrin XL 300 mg daily.   Recent consult visits: 04/15/21: patient presented to Dr. Manuella Ghazi for MCI.   Hospital visits: None in previous 6 months   Objective:  Lab Results  Component Value Date   CREATININE 1.20 (H) 10/14/2021   BUN 16 10/14/2021   GFRNONAA 51 (L) 08/05/2021   GFRAA 83 05/31/2020   NA 139 10/14/2021   K 4.5 10/14/2021   CALCIUM 10.0 10/14/2021   CO2 24 10/14/2021   GLUCOSE 110 (H) 10/14/2021    Lab Results  Component Value Date/Time   HGBA1C 7.8 (H) 10/14/2021 02:48 PM   HGBA1C 7.2 (A) 04/12/2021 03:09 PM   HGBA1C 7.5 (A) 12/13/2020 03:42 PM    HGBA1C 7.6 (H) 09/07/2020 06:41 AM   MICROALBUR 100 04/23/2017 09:31 AM   MICROALBUR Negative 03/30/2015 02:56 PM    Last diabetic Eye exam:  Lab Results  Component Value Date/Time   HMDIABEYEEXA No Retinopathy 01/09/2018 12:00 AM    Last diabetic Foot exam:  Lab Results  Component Value Date/Time   HMDIABFOOTEX normal 03/22/2017 12:00 AM     Lab Results  Component Value Date   CHOL 110 10/14/2021   HDL 33 (L) 10/14/2021   LDLCALC 51 10/14/2021   TRIG 151 (H) 10/14/2021   CHOLHDL 3.3 10/14/2021       Latest Ref Rng & Units 10/14/2021    2:48 PM 08/05/2021    1:43 PM 01/19/2021    9:09 AM  Hepatic Function  Total Protein 6.0 - 8.5 g/dL 7.2   7.7   8.1    Albumin 3.7 - 4.7 g/dL 4.5   4.3   4.4    AST 0 - 40 IU/L '19   19   16    ' ALT 0 - 32 IU/L '15   16   16    ' Alk Phosphatase 44 - 121 IU/L 68   67   64    Total Bilirubin 0.0 - 1.2 mg/dL <0.2   0.2   0.5      Lab Results  Component Value Date/Time   TSH 2.110 10/14/2021 02:48 PM   TSH 1.760 05/31/2020 02:30 PM       Latest Ref Rng & Units 10/14/2021    2:48 PM 08/05/2021    1:43  PM 01/19/2021    9:09 AM  CBC  WBC 3.4 - 10.8 x10E3/uL 7.7   7.0   6.6    Hemoglobin 11.1 - 15.9 g/dL 13.3   13.5   13.2    Hematocrit 34.0 - 46.6 % 39.1   39.8   38.8    Platelets 150 - 450 x10E3/uL 284   249   273      No results found for: VD25OH  Clinical ASCVD: Yes  The ASCVD Risk score (Arnett DK, et al., 2019) failed to calculate for the following reasons:   The patient has a prior MI or stroke diagnosis       10/25/2021    2:36 PM 04/12/2021    3:08 PM 12/13/2020    3:09 PM  Depression screen PHQ 2/9  Decreased Interest '1 2 1  ' Down, Depressed, Hopeless '1 2 1  ' PHQ - 2 Score '2 4 2  ' Altered sleeping  3 0  Tired, decreased energy 0 2 1  Change in appetite 0 2 0  Feeling bad or failure about yourself  0 2 0  Trouble concentrating 1 3 0  Moving slowly or fidgety/restless 0 1 0  Suicidal thoughts 0 0 0  PHQ-9 Score  17 3   Difficult doing work/chores Not difficult at all Somewhat difficult Not difficult at all    Social History   Tobacco Use  Smoking Status Never  Smokeless Tobacco Never   BP Readings from Last 3 Encounters:  10/13/21 (!) 142/84  08/05/21 121/65  07/15/21 140/82   Pulse Readings from Last 3 Encounters:  10/13/21 87  08/05/21 87  07/15/21 76   Wt Readings from Last 3 Encounters:  10/25/21 180 lb (81.6 kg)  10/13/21 180 lb (81.6 kg)  08/05/21 180 lb 12.8 oz (82 kg)   BMI Readings from Last 3 Encounters:  10/25/21 29.05 kg/m  10/13/21 29.05 kg/m  08/05/21 29.18 kg/m    Assessment/Interventions: Review of patient past medical history, allergies, medications, health status, including review of consultants reports, laboratory and other test data, was performed as part of comprehensive evaluation and provision of chronic care management services.   SDOH:  (Social Determinants of Health) assessments and interventions performed: Yes    SDOH Screenings   Alcohol Screen: Low Risk    Last Alcohol Screening Score (AUDIT): 1  Depression (PHQ2-9): Low Risk    PHQ-2 Score: 3  Financial Resource Strain: Low Risk    Difficulty of Paying Living Expenses: Not hard at all  Food Insecurity: No Food Insecurity   Worried About Charity fundraiser in the Last Year: Never true   Ran Out of Food in the Last Year: Never true  Housing: Low Risk    Last Housing Risk Score: 0  Physical Activity: Insufficiently Active   Days of Exercise per Week: 2 days   Minutes of Exercise per Session: 20 min  Social Connections: Moderately Isolated   Frequency of Communication with Friends and Family: Three times a week   Frequency of Social Gatherings with Friends and Family: Never   Attends Religious Services: 1 to 4 times per year   Active Member of [employer] or Organizations: No   Attends Archivist Meetings: Never   Marital Status: Widowed  Stress: No Stress Concern Present   Feeling of  Stress : Not at all  Tobacco Use: Low Risk    Smoking Tobacco Use: Never   Smokeless Tobacco Use: Never   Passive Exposure: Not on  file  Transportation Needs: No Transportation Needs   Lack of Transportation (Medical): No   Lack of Transportation (Non-Medical): No    CCM Care Plan  No Known Allergies  Medications Reviewed Today     Reviewed by Dionisio David, LPN (Licensed Practical Nurse) on 10/25/21 at 1450  Med List Status: <None>   Medication Order Taking? Sig Documenting Provider Last Dose Status Informant  ACCU-CHEK AVIVA PLUS test strip [id number] Yes CHECK  FASTING  BLOOD  GLUCOSE EVERY DAY Jerrol Banana., MD Taking Active   Accu-Chek Softclix Lancets lancets [id number] Yes TEST BLOOD SUGAR EVERY DAY Jerrol Banana., MD Taking Active   acetaminophen (TYLENOL) 500 MG tablet [id number] Yes Take 500 mg by mouth every 6 (six) hours as needed for mild pain or headache.  [provider] Taking Active Self  atorvastatin (LIPITOR) 40 MG tablet [id number] Yes TAKE 1 TABLET EVERY EVENING Jerrol Banana., MD Taking Active   buPROPion (WELLBUTRIN XL) 300 MG 24 hr tablet [id number] Yes Take 1 tablet (300 mg total) by mouth daily. Jerrol Banana., MD Taking Active   Calcium Carb-Cholecalciferol 600-800 MG-UNIT TABS [id number] Yes Take 1 tablet by mouth daily. [provider] Taking Active Self  chlorthalidone (HYGROTON) 25 MG tablet [id number] Yes Take 1 tablet (25 mg total) by mouth daily. Jerrol Banana., MD Taking Active Self  cholecalciferol (VITAMIN D) 1000 units tablet [id number] Yes Take 1,000 Units by mouth daily. [provider] Taking Active Self  clopidogrel (PLAVIX) 75 MG tablet [id number] Yes TAKE 1 TABLET EVERY DAY Jerrol Banana., MD Taking Active   cyanocobalamin 500 MCG tablet [id number] Yes Take 500 mcg by mouth daily. [provider] Taking Active Self  gabapentin (NEURONTIN) 300 MG capsule [id number] No  TAKE 2 CAPSULES AT BEDTIME  Patient not taking: Reported on 10/25/2021   Jerrol Banana., MD Not Taking Active   latanoprost (XALATAN) 0.005 % ophthalmic solution [id number] Yes Place 1 drop into both eyes at bedtime.  [provider] Taking Active Self  letrozole [facility]) 2.5 MG tablet [id number] Yes TAKE 1 TABLET (2.5 MG TOTAL) BY MOUTH DAILY. Jerrol Banana., MD Taking Active Self  losartan (COZAAR) 50 MG tablet [id number] Yes TAKE 1 TABLET (50 MG TOTAL) DAILY. Jerrol Banana., MD Taking Active   meclizine (ANTIVERT) 25 MG tablet [id number] Yes Take 0.5 tablets (12.5 mg total) by mouth 3 (three) times daily as needed for dizziness. Hinda Kehr, MD Taking Active Self  meloxicam [facility]) 15 MG tablet [id number] No TAKE 1 TABLET EVERY DAY  Patient not taking: Reported on 10/25/2021   Jerrol Banana., MD Not Taking Active   metFORMIN (GLUCOPHAGE) 1000 MG tablet [id number] Yes TAKE 1 TABLET TWICE DAILY  WITH  A  MEAL Jerrol Banana., MD Taking Active   metoprolol tartrate (LOPRESSOR) 25 MG tablet [id number] Yes TAKE 1/2 TABLET TWICE DAILY Jerrol Banana., MD Taking Active   Multiple Vitamin (MULTIVITAMIN WITH MINERALS) TABS tablet [id number] Yes Take 1 tablet by mouth daily. [provider] Taking Active Self  omega-3 acid ethyl esters (LOVAZA) 1 g capsule [id number] Yes Take 1 g by mouth daily.  [provider] Taking Active Self  pantoprazole (PROTONIX) 40 MG tablet [id number] Yes TAKE 1 TABLET TWICE DAILY Jerrol Banana., MD Taking Active   pyridoxine (B-6) 100 MG tablet [id number] Yes Take 100 mg by mouth daily. [provider] Taking Active  Self  sertraline (ZOLOFT) 100 MG tablet [id number] No TAKE 1/2 TABLET EVERY DAY  Patient not taking: Reported on 10/25/2021   Jerrol Banana., MD Not Taking Active   vitamin C (ASCORBIC ACID) 500 MG tablet [id number] Yes Take 500 mg by mouth daily. [provider] Taking  Active Self  vitamin E 400 UNIT capsule [id number] Yes Take 400 Units by mouth daily. [provider] Taking Active Self            Patient Active Problem List   Diagnosis Date Noted   Type 2 diabetes mellitus with complication, without long-term current use of insulin ([place]) 12/17/2020   Severe sepsis ([place]) 27/51/7001   Complicated UTI (urinary tract infection) 11/05/2020   Hydronephrosis with renal and ureteral calculus obstruction 11/05/2020   History of breast cancer 11/05/2020   History of CVA (cerebrovascular accident) 11/05/2020   AKI (acute kidney injury) ([place]) 11/05/2020   Hemiparesis affecting left side as late effect of stroke ([place]) 11/05/2020   Postural dizziness with presyncope 11/05/2020   Stroke ([place]) 09/06/2020   Leukocytes in urine 06/09/2020   Grief 06/09/2020   Episode of recurrent major depressive disorder ([place]) 06/09/2020   Urinary symptom or sign 06/09/2020   Left sided numbness 11/06/2018   Fat necrosis of breast 74/94/4967   Umbilical pain 59/16/3846   Breast mass, left 04/07/2016   Controlled type 2 diabetes mellitus without complication ([place]) 65/99/3570   Allergic rhinitis 03/30/2015   Anxiety 03/30/2015   Carotid arterial disease ([place]) 03/30/2015   Diabetes mellitus, type 2 ([place]) 03/30/2015   Essential (primary) hypertension 03/30/2015   Hypercholesteremia 03/30/2015   Left leg pain 03/30/2015   LBP (low back pain) 03/30/2015   Neuropathic pain 03/30/2015   Burning or prickling sensation 03/30/2015   Neuralgia neuritis, sciatic nerve 03/30/2015   Skin cyst 11/24/2014   Carcinoma of upper-outer quadrant of right breast in female, estrogen receptor positive ([place]) 08/17/2011   Allergic reaction 11/29/2009   Adaptation reaction 04/30/2009   Acid reflux 04/30/2009   Stricture and stenosis of cervix 01/28/2009    Immunization History  Administered Date(s) Administered   Fluad Quad(high Dose 65+) 06/24/2019, 05/03/2020, 05/11/2021    Influenza, High Dose Seasonal PF 05/13/2014, 05/29/2015, 04/05/2016, 04/19/2017, 04/11/2018   Influenza,inj,Quad PF,6+ Mos 05/23/2013   PFIZER(Purple Top)SARS-COV-2 Vaccination 10/17/2019, 11/11/2019, 07/07/2020   Pneumococcal Conjugate-13 05/13/2014   Pneumococcal Polysaccharide-23 06/07/2012   Td 04/19/2007, 05/05/2011   Tdap 05/05/2011   Zoster, Live 04/27/2008    Conditions to be addressed/monitored:  Hypertension, Hyperlipidemia, Diabetes, and Coronary Artery Disease  Care Plan : General Pharmacy (Adult)  Updates made by Germaine Pomfret, RPH since 12/20/2021 12:00 AM     Problem: Hypertension, Hyperlipidemia, Diabetes, and Coronary Artery Disease   Priority: High     Long-Range Goal: Patient-Specific Goal   Start Date: 02/21/2021  Expected End Date: 03/14/2022  This Visit's Progress: On track  Recent Progress: On track  Priority: High  Note:   Current Barriers:  No barriers noted  Pharmacist Clinical Goal(s):  Patient will maintain control of blood pressure as evidenced by BP less than 140/90  through collaboration with PharmD and provider.   Interventions: 1:1 collaboration with Jerrol Banana., MD regarding development and update of comprehensive plan of care as evidenced by provider attestation and co-signature Inter-disciplinary care team collaboration (see longitudinal plan of care) Comprehensive medication review performed; medication list updated in electronic medical record  Hypertension (BP goal <140/90) -Controlled -Current treatment: Chlorthalidone 25 mg  daily  Losartan 50 mg daily  Metoprolol tartrate 25 mg 1/2 tablet twice daily  -Medications previously tried: NA  -Current home readings:  127/76, highest in recall was 582P systolic.  -Reports hypotensive symptoms: Dizziness when standing up too fast.  -Drinks mainly water, Coke zero  -Recommended to continue current medication  Hyperlipidemia: (LDL goal < 70) -Controlled: Not addressed  during this visit. -Current treatment: Atorvastatin 40 mg daily Lovaza 1 g daily  -Medications previously tried: NA -Recommended to continue current medication  Diabetes (A1c goal <8%) -Controlled -Current medications: Metformin 1000 mg twice daily  -Medications previously tried: NA  -Current home glucose readings fasting glucose: 151, 130,  -Denies hypoglycemic/hyperglycemic symptoms -Recommended to continue current medication  Depression/Anxiety (Goal: maintain symptom remission) -Controlled -Current treatment: Bupropion XL 300 mg daily  Sertraline 100 mg 1/2 tablet daily  -Medications previously tried/failed: NA -PHQ9: 3 -Some depressed mood, anhedonia. Overall feels her symptoms  -Continue current medications  Chronic Kidney Disease Stage 3a  -All medications assessed for renal dosing and appropriateness in chronic kidney disease. -Recommended to continue current medication  Patient Goals/Self-Care Activities Patient will:  - check glucose daily before breakfast, document, and provide at future appointments check blood pressure weekly, document, and provide at future appointments  Follow Up Plan: Telephone follow up appointment with care management team member scheduled for:  06/12/2022 at 3:00 PM    Medication Assistance: None required.  Patient affirms current coverage meets needs.  Compliance/Adherence/Medication fill history: Care Gaps: Hepatitis C  Shingrix Foot Exam Ophthalmology Exam  Covid Booster Influenza  Star-Rating Drugs: Atorvastatin 40 mg daily: LF 10/21/21 for 90-DS Losartan 50 mg daily: LF 10/12/21 for 90-DS Metformin 1000 mg: LF 11/15/21 for 90-DS   Patient's preferred pharmacy is:  [facility] [place], [place] [address] Phone: [phone] Fax: [phone]  [facility] [address] [place] - [place]) [place] [address] Phone: [phone] Fax: [phone]   Uses pill box? Yes Pt endorses 100% compliance  We discussed: Current pharmacy is preferred with insurance plan and patient is satisfied with pharmacy services Patient decided to: Continue current medication management strategy  Care Plan and Follow Up Patient Decision:  Patient agrees to Care Plan and Follow-up.  Plan: Telephone follow up appointment with care management team member scheduled for:  06/12/2022 at 3:00 PM  [name], PharmD, [facility] [place], [place] [phone]

## 2021-12-20 NOTE — Patient Instructions (Signed)
Visit Information ?It was great speaking with you today!  Please let me know if you have any questions about our visit. ? ? Goals Addressed   ? ?  ?  ?  ?  ? This Visit's Progress  ?  Monitor and Manage My Blood Sugar-Diabetes Type 2   On track  ?  Timeframe:  Long-Range Goal ?Priority:  High ?Start Date:  02/21/2021                            ?Expected End Date:  02/22/2023                    ? ?Follow Up within 90 days ?  ?- check blood sugar at prescribed times ?- check blood sugar if I feel it is too high or too low ?- enter blood sugar readings and medication or insulin into daily log  ?  ?Why is this important?   ?Checking your blood sugar at home helps to keep it from getting very high or very low.  ?Writing the results in a diary or log helps the doctor know how to care for you.  ?Your blood sugar log should have the time, date and the results.  ?Also, write down the amount of insulin or other medicine that you take.  ?Other information, like what you ate, exercise done and how you were feeling, will also be helpful.   ?  ?Notes:  ?  ? ?  ? ? ?Patient Care Plan: General Pharmacy (Adult)  ?  ? ?Problem Identified: Hypertension, Hyperlipidemia, Diabetes, and Coronary Artery Disease   ?Priority: High  ?  ? ?Long-Range Goal: Patient-Specific Goal   ?Start Date: 02/21/2021  ?Expected End Date: 03/14/2022  ?This Visit's Progress: On track  ?Recent Progress: On track  ?Priority: High  ?Note:   ?Current Barriers:  ?No barriers noted ? ?Pharmacist Clinical Goal(s):  ?Patient will maintain control of blood pressure as evidenced by BP less than 140/90  through collaboration with PharmD and provider.  ? ?Interventions: ?1:1 collaboration with Jerrol Banana., MD regarding development and update of comprehensive plan of care as evidenced by provider attestation and co-signature ?Inter-disciplinary care team collaboration (see longitudinal plan of care) ?Comprehensive medication review performed; medication list updated  in electronic medical record ? ?Hypertension (BP goal <140/90) ?-Controlled ?-Current treatment: ?Chlorthalidone 25 mg daily  ?Losartan 50 mg daily  ?Metoprolol tartrate 25 mg 1/2 tablet twice daily  ?-Medications previously tried: NA  ?-Current home readings:  ?127/76, highest in recall was 654Y systolic.  ?-Reports hypotensive symptoms: Dizziness when standing up too fast.  ?-Drinks mainly water, Coke zero  ?-Recommended to continue current medication ? ?Hyperlipidemia: (LDL goal < 70) ?-Controlled: Not addressed during this visit. ?-Current treatment: ?Atorvastatin 40 mg daily ?Lovaza 1 g daily  ?-Medications previously tried: NA ?-Recommended to continue current medication ? ?Diabetes (A1c goal <8%) ?-Controlled ?-Current medications: ?Metformin 1000 mg twice daily  ?-Medications previously tried: NA  ?-Current home glucose readings ?fasting glucose: 151, 130,  ?-Denies hypoglycemic/hyperglycemic symptoms ?-Recommended to continue current medication ? ?Depression/Anxiety (Goal: maintain symptom remission) ?-Controlled ?-Current treatment: ?Bupropion XL 300 mg daily  ?Sertraline 100 mg 1/2 tablet daily  ?-Medications previously tried/failed: NA ?-PHQ9: 3 ?-Some depressed mood, anhedonia. Overall feels her symptoms  ?-Continue current medications ? ?Chronic Kidney Disease Stage 3a  ?-All medications assessed for renal dosing and appropriateness in chronic kidney disease. ?-Recommended to continue current medication ? ?  Patient Goals/Self-Care Activities ?Patient will:  ?- check glucose daily before breakfast, document, and provide at future appointments ?check blood pressure weekly, document, and provide at future appointments ? ?Follow Up Plan: Telephone follow up appointment with care management team member scheduled for:  06/12/2022 at 3:00 PM ?  ? ? ?Patient agreed to services and verbal consent obtained.  ? ?Patient verbalizes understanding of instructions and care plan provided today and agrees to view in  Reading. Active MyChart status confirmed with patient.   ? ?[name], PharmD, BCACP, CPP  ?Clinical Pharmacist Practitioner  ?[place] ?[phone]  ?

## 2022-01-04 DIAGNOSIS — I10 Essential (primary) hypertension: Secondary | ICD-10-CM

## 2022-01-04 DIAGNOSIS — F330 Major depressive disorder, recurrent, mild: Secondary | ICD-10-CM

## 2022-01-04 DIAGNOSIS — E119 Type 2 diabetes mellitus without complications: Secondary | ICD-10-CM

## 2022-01-17 DIAGNOSIS — R9431 Abnormal electrocardiogram [ECG] [EKG]: Secondary | ICD-10-CM | POA: Insufficient documentation

## 2022-01-17 DIAGNOSIS — R0789 Other chest pain: Secondary | ICD-10-CM | POA: Diagnosis not present

## 2022-01-17 DIAGNOSIS — R0602 Shortness of breath: Secondary | ICD-10-CM | POA: Insufficient documentation

## 2022-01-17 DIAGNOSIS — I639 Cerebral infarction, unspecified: Secondary | ICD-10-CM | POA: Diagnosis not present

## 2022-01-17 DIAGNOSIS — I779 Disorder of arteries and arterioles, unspecified: Secondary | ICD-10-CM | POA: Diagnosis not present

## 2022-01-17 DIAGNOSIS — I10 Essential (primary) hypertension: Secondary | ICD-10-CM | POA: Diagnosis not present

## 2022-01-17 DIAGNOSIS — N183 Chronic kidney disease, stage 3 unspecified: Secondary | ICD-10-CM | POA: Diagnosis not present

## 2022-01-19 ENCOUNTER — Ambulatory Visit (INDEPENDENT_AMBULATORY_CARE_PROVIDER_SITE_OTHER): Payer: Medicare HMO | Admitting: Family Medicine

## 2022-01-19 ENCOUNTER — Encounter: Payer: Self-pay | Admitting: Family Medicine

## 2022-01-19 VITALS — BP 133/82 | HR 87 | Resp 16 | Wt 180.0 lb

## 2022-01-19 DIAGNOSIS — I639 Cerebral infarction, unspecified: Secondary | ICD-10-CM

## 2022-01-19 DIAGNOSIS — Z853 Personal history of malignant neoplasm of breast: Secondary | ICD-10-CM

## 2022-01-19 DIAGNOSIS — E118 Type 2 diabetes mellitus with unspecified complications: Secondary | ICD-10-CM | POA: Diagnosis not present

## 2022-01-19 DIAGNOSIS — M792 Neuralgia and neuritis, unspecified: Secondary | ICD-10-CM | POA: Diagnosis not present

## 2022-01-19 DIAGNOSIS — E7800 Pure hypercholesterolemia, unspecified: Secondary | ICD-10-CM

## 2022-01-19 DIAGNOSIS — I10 Essential (primary) hypertension: Secondary | ICD-10-CM | POA: Diagnosis not present

## 2022-01-19 DIAGNOSIS — E119 Type 2 diabetes mellitus without complications: Secondary | ICD-10-CM

## 2022-01-19 LAB — POCT GLYCOSYLATED HEMOGLOBIN (HGB A1C)
Est. average glucose Bld gHb Est-mCnc: 154
Hemoglobin A1C: 7 % — AB (ref 4.0–5.6)

## 2022-01-19 NOTE — Patient Instructions (Signed)
STOP MELOXICAM.

## 2022-01-19 NOTE — Progress Notes (Unsigned)
Established patient visit   Patient: Leah Schaefer   DOB: [birth year]   81 y.o. Female  MRN: [id number] Visit Date: 01/19/2022  Today's healthcare provider: [name], MD   Chief Complaint  Patient presents with   Follow-up   Diabetes   Subjective    HPI  Diabetes Mellitus Type II, Follow-up  Lab Results  Component Value Date   HGBA1C 7.0 (A) 01/19/2022   HGBA1C 7.8 (H) 10/14/2021   HGBA1C 7.2 (A) 04/12/2021   Wt Readings from Last 3 Encounters:  01/19/22 180 lb (81.6 kg)  10/25/21 180 lb (81.6 kg)  10/13/21 180 lb (81.6 kg)   Last seen for diabetes 3 months ago.  Management since then includes none  Home blood sugar records: 120-160 Most Recent Eye Exam: DUE  Pertinent Labs: Lab Results  Component Value Date   CHOL 110 10/14/2021   HDL 33 (L) 10/14/2021   LDLCALC 51 10/14/2021   TRIG 151 (H) 10/14/2021   CHOLHDL 3.3 10/14/2021   Lab Results  Component Value Date   NA 139 10/14/2021   K 4.5 10/14/2021   CREATININE 1.20 (H) 10/14/2021   EGFR 46 (L) 10/14/2021   MICROALBUR 100 04/23/2017   LABMICR See below: 11/22/2020     ---------------------------------------------------------------------------------------------------   Medications: Outpatient Medications Prior to Visit  Medication Sig   ACCU-CHEK AVIVA PLUS test strip CHECK  FASTING  BLOOD  GLUCOSE EVERY DAY   Accu-Chek Softclix Lancets lancets TEST BLOOD SUGAR EVERY DAY   acetaminophen (TYLENOL) 500 MG tablet Take 500 mg by mouth every 6 (six) hours as needed for mild pain or headache.    atorvastatin (LIPITOR) 40 MG tablet TAKE 1 TABLET EVERY EVENING   buPROPion (WELLBUTRIN XL) 300 MG 24 hr tablet Take 1 tablet (300 mg total) by mouth daily.   Calcium Carb-Cholecalciferol 600-800 MG-UNIT TABS Take 1 tablet by mouth daily.   chlorthalidone (HYGROTON) 25 MG tablet Take 1 tablet (25 mg total) by mouth daily.   cholecalciferol (VITAMIN D) 1000 units tablet Take 1,000 Units by mouth  daily.   clopidogrel (PLAVIX) 75 MG tablet TAKE 1 TABLET EVERY DAY   cyanocobalamin 500 MCG tablet Take 500 mcg by mouth daily.   gabapentin (NEURONTIN) 300 MG capsule TAKE 2 CAPSULES AT BEDTIME   latanoprost (XALATAN) 0.005 % ophthalmic solution Place 1 drop into both eyes at bedtime.    letrozole (FEMARA) 2.5 MG tablet TAKE 1 TABLET (2.5 MG TOTAL) BY MOUTH DAILY.   losartan (COZAAR) 50 MG tablet TAKE 1 TABLET (50 MG TOTAL) DAILY.   meclizine (ANTIVERT) 25 MG tablet Take 0.5 tablets (12.5 mg total) by mouth 3 (three) times daily as needed for dizziness.   meloxicam (MOBIC) 15 MG tablet TAKE 1 TABLET EVERY DAY   metFORMIN (GLUCOPHAGE) 1000 MG tablet TAKE 1 TABLET TWICE DAILY  WITH  A  MEAL   metoprolol tartrate (LOPRESSOR) 25 MG tablet TAKE 1/2 TABLET TWICE DAILY   Multiple Vitamin (MULTIVITAMIN WITH MINERALS) TABS tablet Take 1 tablet by mouth daily.   omega-3 acid ethyl esters (LOVAZA) 1 g capsule Take 1 g by mouth daily.    pantoprazole (PROTONIX) 40 MG tablet TAKE 1 TABLET TWICE DAILY   pyridoxine (B-6) 100 MG tablet Take 100 mg by mouth daily.   sertraline (ZOLOFT) 100 MG tablet TAKE 1/2 TABLET EVERY DAY   vitamin C (ASCORBIC ACID) 500 MG tablet Take 500 mg by mouth daily.   vitamin E 400 UNIT capsule Take  400 Units by mouth daily.   No facility-administered medications prior to visit.    Review of Systems  Constitutional:  Negative for appetite change, chills, fatigue and fever.  Respiratory:  Negative for chest tightness and shortness of breath.   Cardiovascular:  Negative for chest pain and palpitations.  Gastrointestinal:  Negative for abdominal pain, nausea and vomiting.  Neurological:  Negative for dizziness and weakness.    {Labs  Heme  Chem  Endocrine  Serology  Results Review (optional):23779}   Objective    BP 133/82 (BP Location: Right Arm, Patient Position: Sitting, Cuff Size: Large)   Pulse 87   Resp 16   Wt 180 lb (81.6 kg)   SpO2 99%   BMI 29.05 kg/m   {Show previous vital signs (optional):23777}  Physical Exam  ***  Results for orders placed or performed in visit on 01/19/22  POCT glycosylated hemoglobin (Hb A1C)  Result Value Ref Range   Hemoglobin A1C 7.0 (A) 4.0 - 5.6 %   Est. average glucose Bld gHb Est-mCnc 154     Assessment & Plan     ***  Return in about 4 months (around 05/21/2022).      {provider attestation***:1}   [name], MD  [facility] [phone] (phone) [phone] (fax)  [place]

## 2022-01-30 ENCOUNTER — Other Ambulatory Visit: Payer: Self-pay | Admitting: General Surgery

## 2022-01-30 DIAGNOSIS — Z853 Personal history of malignant neoplasm of breast: Secondary | ICD-10-CM

## 2022-02-03 ENCOUNTER — Inpatient Hospital Stay: Payer: Medicare HMO | Attending: Nurse Practitioner

## 2022-02-03 ENCOUNTER — Inpatient Hospital Stay (HOSPITAL_BASED_OUTPATIENT_CLINIC_OR_DEPARTMENT_OTHER): Payer: Medicare HMO | Admitting: Nurse Practitioner

## 2022-02-03 ENCOUNTER — Ambulatory Visit: Payer: Medicare HMO | Admitting: Internal Medicine

## 2022-02-03 VITALS — BP 150/84 | HR 79 | Temp 97.9°F | Resp 14 | Wt 187.0 lb

## 2022-02-03 DIAGNOSIS — Z923 Personal history of irradiation: Secondary | ICD-10-CM | POA: Diagnosis not present

## 2022-02-03 DIAGNOSIS — C50411 Malignant neoplasm of upper-outer quadrant of right female breast: Secondary | ICD-10-CM | POA: Insufficient documentation

## 2022-02-03 DIAGNOSIS — Z8041 Family history of malignant neoplasm of ovary: Secondary | ICD-10-CM | POA: Insufficient documentation

## 2022-02-03 DIAGNOSIS — Z9049 Acquired absence of other specified parts of digestive tract: Secondary | ICD-10-CM | POA: Diagnosis not present

## 2022-02-03 DIAGNOSIS — Z7901 Long term (current) use of anticoagulants: Secondary | ICD-10-CM | POA: Diagnosis not present

## 2022-02-03 DIAGNOSIS — Z08 Encounter for follow-up examination after completed treatment for malignant neoplasm: Secondary | ICD-10-CM

## 2022-02-03 DIAGNOSIS — R42 Dizziness and giddiness: Secondary | ICD-10-CM | POA: Diagnosis not present

## 2022-02-03 DIAGNOSIS — Z170 Estrogen receptor positive status [ER+]: Secondary | ICD-10-CM | POA: Insufficient documentation

## 2022-02-03 DIAGNOSIS — G8929 Other chronic pain: Secondary | ICD-10-CM | POA: Diagnosis not present

## 2022-02-03 DIAGNOSIS — Z8673 Personal history of transient ischemic attack (TIA), and cerebral infarction without residual deficits: Secondary | ICD-10-CM | POA: Insufficient documentation

## 2022-02-03 DIAGNOSIS — Z800 Family history of malignant neoplasm of digestive organs: Secondary | ICD-10-CM | POA: Diagnosis not present

## 2022-02-03 DIAGNOSIS — Z808 Family history of malignant neoplasm of other organs or systems: Secondary | ICD-10-CM | POA: Diagnosis not present

## 2022-02-03 DIAGNOSIS — Z853 Personal history of malignant neoplasm of breast: Secondary | ICD-10-CM | POA: Diagnosis not present

## 2022-02-03 DIAGNOSIS — Z87442 Personal history of urinary calculi: Secondary | ICD-10-CM | POA: Diagnosis not present

## 2022-02-03 DIAGNOSIS — M549 Dorsalgia, unspecified: Secondary | ICD-10-CM | POA: Diagnosis not present

## 2022-02-03 DIAGNOSIS — Z7902 Long term (current) use of antithrombotics/antiplatelets: Secondary | ICD-10-CM | POA: Diagnosis not present

## 2022-02-03 DIAGNOSIS — M2550 Pain in unspecified joint: Secondary | ICD-10-CM | POA: Insufficient documentation

## 2022-02-03 DIAGNOSIS — I10 Essential (primary) hypertension: Secondary | ICD-10-CM | POA: Insufficient documentation

## 2022-02-03 DIAGNOSIS — Z79899 Other long term (current) drug therapy: Secondary | ICD-10-CM | POA: Insufficient documentation

## 2022-02-03 DIAGNOSIS — E119 Type 2 diabetes mellitus without complications: Secondary | ICD-10-CM | POA: Insufficient documentation

## 2022-02-03 NOTE — Progress Notes (Signed)
[facility] OFFICE PROGRESS NOTE  Patient Care Team: Jerrol Banana., MD as PCP - General (Family Medicine) Bary Castilla, Forest Gleason, MD (General Surgery) Cammie Sickle, MD as Consulting Physician (Internal Medicine) Lorelee Cover., MD as Consulting Physician (Ophthalmology) Noreene Filbert, MD as Referring Physician (Radiation Oncology) Germaine Pomfret, [facility] as Pharmacist (Pharmacist)  SUMMARY OF HEMATOLOGIC/ONCOLOGIC HISTORY:  Oncology History Overview Note  # FEB 2017- RIGHT BREAST  STAGE I [T1b N0 M0]. ER/PR ER positive 100% PR positive 95% HER 2 FISH negative;  INVASIVE MAMMARY CARCINOMA WITH MICROPAPILLARY FEATURES. S/p Lumpec [Dr.Byrnett] & RT; No Oncotype; Femara  # 2013- LEFT BREAST- STAGE II; ER/PR/ Her 2 NEG s/p Lumpec [Dr.Byrnett] & RT  # BMD- April  2017- wnl; genetic testing- declined [May 2017]  # Vertigo [uses rolling walker]   Carcinoma of upper-outer quadrant of right breast in female, estrogen receptor positive ([place])     INTERVAL HISTORY: Alone.  Ambulating independently.   A very pleasant 81 year old African-American female patient with above history of breast cancer most recently diagnosed in February 2017 is here for follow-up; patient currently on Femara.  Patient has had no further strokes.  No further deficits.  Ambulating independently at this time. Denies any unusual lumps or bumps.  Appetite is fair.  Chronic back pain not any worse.  Review of Systems  Constitutional:  Negative for chills, diaphoresis, fever, malaise/fatigue and weight loss.  HENT:  Negative for nosebleeds and sore throat.   Eyes:  Negative for double vision.  Respiratory:  Negative for cough, hemoptysis, sputum production, shortness of breath and wheezing.   Cardiovascular:  Negative for chest pain, palpitations, orthopnea and leg swelling.  Gastrointestinal:  Negative for abdominal pain, blood in stool, constipation, diarrhea, heartburn, melena, nausea  and vomiting.  Genitourinary:  Negative for dysuria, frequency and urgency.  Musculoskeletal:  Positive for back pain and joint pain.  Skin: Negative.  Negative for itching and rash.  Neurological:  Negative for dizziness, tingling, focal weakness, weakness and headaches.  Endo/Heme/Allergies:  Does not bruise/bleed easily.  Psychiatric/Behavioral:  Negative for depression. The patient is not nervous/anxious and does not have insomnia.      PAST MEDICAL HISTORY :  Past Medical History:  Diagnosis Date   Anxiety    Aortic atherosclerosis ([place])    Arthritis    Breast cancer ([place]) 2013   LEFT breast with chemo and rad tx   Breast cancer ([place]) 2017   right breast with rad tx   Breast cancer of upper-inner quadrant of right female breast ([place]) 09/21/2015   T1bN0 " 72m; ER100%, PR 90%, HER-2/neu not overexpressed.   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 Chemotherapy/post wide excision whole breast radiation.   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    Personal history of chemotherapy 2013   LEFT BREAST   Personal history of malignant neoplasm of breast    Personal history of radiation therapy 2017  Rt, 2013 had on left   Stroke ([place]) 09/06/2020   1 cm acute infarction in the right parietal deep and subcortical white matter   Vertigo     PAST SURGICAL HISTORY :   Past Surgical History:  Procedure Laterality Date   BREAST BIOPSY Left 07/18/2011   +   BREAST BIOPSY Left 09/21/2015   neg   BREAST BIOPSY Left  04/05/2016   neg   BREAST BIOPSY Left 03/20/2019   [place] bx 2 areas /fat necrosis at 9:30 ribbon and 5:30 coil   BREAST EXCISIONAL BIOPSY Right 08/2015   [facility]   BREAST LUMPECTOMY Right 09/30/2015   [facility] WITH MICROPAPILLARY FEATURES AND DCIS/CLEAR MARGINS, NEGATIVE LN   BREAST LUMPECTOMY Left 08/17/2011   BREAST LUMPECTOMY WITH SENTINEL LYMPH NODE BIOPSY Right 09/30/2015   Procedure: BREAST LUMPECTOMY WITH SENTINEL LYMPH NODE BX;  Surgeon: Robert Bellow, MD;  Location: [facility] ORS;  Service: General;  Laterality: Right;   BREAST SURGERY Left 2012   wide excision   BREAST SURGERY Left November 2013   Core biopsy of the upper-outer quadrant showed fat necrosis.   CHOLECYSTECTOMY  2007   COLONOSCOPY  2011   Dr Candace Cruise   COLONOSCOPY WITH PROPOFOL N/A 02/25/2015   Procedure: COLONOSCOPY WITH PROPOFOL;  Surgeon: Hulen Luster, MD;  Location: [facility] ENDOSCOPY;  Service: Gastroenterology;  Laterality: N/A;   CYSTOSCOPY WITH STENT PLACEMENT Left 11/05/2020   Procedure: CYSTOSCOPY WITH STENT PLACEMENT;  Surgeon: Abbie Sons, MD;  Location: [facility] ORS;  Service: Urology;  Laterality: Left;   CYSTOSCOPY/URETEROSCOPY/HOLMIUM LASER/STENT PLACEMENT Left 12/07/2020   Procedure: CYSTOSCOPY/URETEROSCOPY/HOLMIUM LASER/STENT PLACEMENT;  Surgeon: Abbie Sons, MD;  Location: [facility] ORS;  Service: Urology;  Laterality: Left;   DILATION AND CURETTAGE OF UTERUS  2004   HERNIA REPAIR Right 33/00/7622   Umbilical/ventral hernia repaired with 6.4 cm Proceed ventral patch   PORT-A-CATH REMOVAL     PORTACATH PLACEMENT  2013   RECURRENT HERNIA N/A 01/07/2008   Recurrent ventral hernia at the umbilicus, laparoscopy, open placement of a large Ultra Pro mesh with trans-fascial sutures.   UPPER GI ENDOSCOPY  2011    FAMILY HISTORY :   Family History  Problem Relation Age of Onset   Lymphoma Father    Ovarian cancer Sister    Colon cancer Brother    Lymphoma Brother    Cancer Other        stomach   Cancer Other         stomach   Breast cancer Neg Hx     SOCIAL HISTORY:   Social History   Tobacco Use   Smoking status: Never   Smokeless tobacco: Never  Vaping Use   Vaping Use: Never used  Substance Use Topics   Alcohol use: No   Drug use: No    ALLERGIES:  has No Known Allergies.  MEDICATIONS:  Current Outpatient Medications  Medication Sig Dispense Refill   ACCU-CHEK AVIVA PLUS test strip CHECK  FASTING  BLOOD  GLUCOSE EVERY DAY 100 strip 11   Accu-Chek Softclix Lancets lancets TEST BLOOD SUGAR EVERY DAY 100 each 11   acetaminophen (TYLENOL) 500 MG tablet Take 500 mg by mouth every 6 (six) hours as needed for mild pain or headache.      atorvastatin (LIPITOR) 40 MG tablet TAKE 1 TABLET EVERY EVENING 90 tablet 0   buPROPion (WELLBUTRIN XL) 300 MG 24 hr tablet Take 1 tablet (300 mg total) by mouth daily. 90 tablet 3   Calcium  Carb-Cholecalciferol 600-800 MG-UNIT TABS Take 1 tablet by mouth daily.     chlorthalidone (HYGROTON) 25 MG tablet Take 1 tablet (25 mg total) by mouth daily. 90 tablet 4   cholecalciferol (VITAMIN D) 1000 units tablet Take 1,000 Units by mouth daily.     clopidogrel (PLAVIX) 75 MG tablet TAKE 1 TABLET EVERY DAY 90 tablet 0   cyanocobalamin 500 MCG tablet Take 500 mcg by mouth daily.     gabapentin (NEURONTIN) 300 MG capsule TAKE 2 CAPSULES AT BEDTIME 180 capsule 3   latanoprost (XALATAN) 0.005 % ophthalmic solution Place 1 drop into both eyes at bedtime.      letrozole (FEMARA) 2.5 MG tablet TAKE 1 TABLET (2.5 MG TOTAL) BY MOUTH DAILY. 90 tablet 3   losartan (COZAAR) 50 MG tablet TAKE 1 TABLET (50 MG TOTAL) DAILY. 90 tablet 0   meclizine (ANTIVERT) 25 MG tablet Take 0.5 tablets (12.5 mg total) by mouth 3 (three) times daily as needed for dizziness. 15 tablet 0   meloxicam (MOBIC) 15 MG tablet TAKE 1 TABLET EVERY DAY 90 tablet 1   metFORMIN (GLUCOPHAGE) 1000 MG tablet TAKE 1 TABLET TWICE DAILY  WITH  A  MEAL 180 tablet 0   metoprolol tartrate (LOPRESSOR) 25 MG tablet TAKE  1/2 TABLET TWICE DAILY 90 tablet 0   Multiple Vitamin (MULTIVITAMIN WITH MINERALS) TABS tablet Take 1 tablet by mouth daily.     omega-3 acid ethyl esters (LOVAZA) 1 g capsule Take 1 g by mouth daily.      pantoprazole (PROTONIX) 40 MG tablet TAKE 1 TABLET TWICE DAILY 180 tablet 1   pyridoxine (B-6) 100 MG tablet Take 100 mg by mouth daily.     sertraline (ZOLOFT) 100 MG tablet TAKE 1/2 TABLET EVERY DAY 45 tablet 4   vitamin C (ASCORBIC ACID) 500 MG tablet Take 500 mg by mouth daily.     vitamin E 400 UNIT capsule Take 400 Units by mouth daily.     No current facility-administered medications for this visit.    PHYSICAL EXAMINATION: ECOG PERFORMANCE STATUS: 0 - Asymptomatic  BP (!) 150/84   Pulse 79   Temp 97.9 F (36.6 C) (Tympanic)   Resp 14   Wt 187 lb (84.8 kg)   BMI 30.18 kg/m   Filed Weights   02/03/22 1428  Weight: 187 lb (84.8 kg)   Physical Exam HENT:     Head: Normocephalic and atraumatic.     Mouth/Throat:     Pharynx: No oropharyngeal exudate.  Eyes:     Pupils: Pupils are equal, round, and reactive to light.  Cardiovascular:     Rate and Rhythm: Normal rate and regular rhythm.  Pulmonary:     Effort: No respiratory distress.     Breath sounds: No wheezing.  Abdominal:     General: Bowel sounds are normal. There is no distension.     Palpations: Abdomen is soft. There is no mass.     Tenderness: There is no abdominal tenderness. There is no guarding or rebound.  Musculoskeletal:        General: No tenderness. Normal range of motion.     Cervical back: Normal range of motion and neck supple.  Skin:    General: Skin is warm.  Neurological:     Mental Status: She is alert and oriented to person, place, and time.  Psychiatric:        Mood and Affect: Affect normal.     LABORATORY DATA:  I have reviewed  the data as listed    Component Value Date/Time   NA 139 10/14/2021 1448   NA 140 12/23/2012 0949   K 4.5 10/14/2021 1448   K 4.7 12/23/2012 0949    CL 99 10/14/2021 1448   CL 101 12/23/2012 0949   CO2 24 10/14/2021 1448   CO2 30 12/23/2012 0949   GLUCOSE 110 (H) 10/14/2021 1448   GLUCOSE 109 (H) 08/05/2021 1343   GLUCOSE 168 (H) 12/23/2012 0949   BUN 16 10/14/2021 1448   BUN 9 12/23/2012 0949   CREATININE 1.20 (H) 10/14/2021 1448   CREATININE 0.92 09/04/2014 0916   CALCIUM 10.0 10/14/2021 1448   CALCIUM 9.8 12/23/2012 0949   PROT 7.2 10/14/2021 1448   PROT 7.4 09/04/2014 0916   ALBUMIN 4.5 10/14/2021 1448   ALBUMIN 3.7 09/04/2014 0916   AST 19 10/14/2021 1448   AST 21 09/04/2014 0916   ALT 15 10/14/2021 1448   ALT 34 09/04/2014 0916   ALKPHOS 68 10/14/2021 1448   ALKPHOS 85 09/04/2014 0916   BILITOT <0.2 10/14/2021 1448   BILITOT 0.4 09/04/2014 0916   GFRNONAA 51 (L) 08/05/2021 1343   GFRNONAA >60 09/04/2014 0916   GFRNONAA >60 02/18/2014 1048   GFRAA 83 05/31/2020 1430   GFRAA >60 09/04/2014 0916   GFRAA >60 02/18/2014 1048    No results found for: "SPEP", "UPEP"  Lab Results  Component Value Date   WBC 7.7 10/14/2021   NEUTROABS 4.3 10/14/2021   HGB 13.3 10/14/2021   HCT 39.1 10/14/2021   MCV 88 10/14/2021   PLT 284 10/14/2021    ASSESSMENT & PLAN:   No problem-specific Assessment & Plan notes found for this encounter.  Carcinoma of upper-outer quadrant of right breast in female, estrogen receptor positive ([employer]) # 2017 right breast cancer stage I ER/PR positive HER-2/neu negative status post lumpectomy. No chemotherapy/no onotype.G-1 Status post adjuvant radiation.  STABLE. Mammo- Aug 2022- WNL [Dr.Byrnett].   # She has now completed 5 years of femara. BCI testing did not reveal benefit to extended AI and low risk of recurrence. She will now transition to annual surveillance. Given Dr. Dwyane Luo retirement, can consider mammmograms through cancer center if she prefers or through one of his partners.    # BMD: May 2022- T-score of -0.4. continue calcium and vitamin D.  STABLE. Repeat in May 2024.    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  No evidence of any malignancy on imaging.   # DISPOSITION:  1 year lab (cbc, cmp, ca27.29), Dr. Rogue Bussing- [place]     Verlon Au, NP 02/03/2022

## 2022-02-03 NOTE — Progress Notes (Signed)
Pt returns for 6 month follow-up. Denies any new problems or concerns.

## 2022-02-04 LAB — CANCER ANTIGEN 27.29: CA 27.29: 20 U/mL (ref 0.0–38.6)

## 2022-02-10 ENCOUNTER — Telehealth: Payer: Self-pay

## 2022-02-10 NOTE — Progress Notes (Signed)
Chronic Care Management Pharmacy Assistant   Name: Leah Schaefer  MRN: [id number] DOB: August 26, 1940  Reason for Encounter: Diabetes Disease State Call   Recent office visits:  01/19/2022 [name], MD (PCP Office Visit) for Follow-up- No medication changes noted, No orders placed, Patient to follow-up in 4 months  Recent consult visits:  02/03/2022 Beckey Rutter, NP (Oncology) for Follow-up of Breast Cancer-No medication changes noted, No orders placed, BP was elevated 150/84, Patient to follow-up in 1 year  01/17/2022  Flossie Dibble, MD (Cardiology) for Follow-up- No medication changes noted, No orders placed, patient to follow-up in 1 year.  Hospital visits:  None in previous 6 months  Medications: Outpatient Encounter Medications as of 02/10/2022  Medication Sig   ACCU-CHEK AVIVA PLUS test strip CHECK  FASTING  BLOOD  GLUCOSE EVERY DAY   Accu-Chek Softclix Lancets lancets TEST BLOOD SUGAR EVERY DAY   acetaminophen (TYLENOL) 500 MG tablet Take 500 mg by mouth every 6 (six) hours as needed for mild pain or headache.    atorvastatin (LIPITOR) 40 MG tablet TAKE 1 TABLET EVERY EVENING   buPROPion (WELLBUTRIN XL) 300 MG 24 hr tablet Take 1 tablet (300 mg total) by mouth daily.   Calcium Carb-Cholecalciferol 600-800 MG-UNIT TABS Take 1 tablet by mouth daily.   chlorthalidone (HYGROTON) 25 MG tablet Take 1 tablet (25 mg total) by mouth daily.   cholecalciferol (VITAMIN D) 1000 units tablet Take 1,000 Units by mouth daily.   clopidogrel (PLAVIX) 75 MG tablet TAKE 1 TABLET EVERY DAY   cyanocobalamin 500 MCG tablet Take 500 mcg by mouth daily.   gabapentin (NEURONTIN) 300 MG capsule TAKE 2 CAPSULES AT BEDTIME   latanoprost (XALATAN) 0.005 % ophthalmic solution Place 1 drop into both eyes at bedtime.    letrozole (FEMARA) 2.5 MG tablet TAKE 1 TABLET (2.5 MG TOTAL) BY MOUTH DAILY. (Patient not taking: Reported on 02/03/2022)   losartan (COZAAR) 50 MG tablet TAKE 1 TABLET (50 MG  TOTAL) DAILY.   meclizine (ANTIVERT) 25 MG tablet Take 0.5 tablets (12.5 mg total) by mouth 3 (three) times daily as needed for dizziness.   meloxicam (MOBIC) 15 MG tablet TAKE 1 TABLET EVERY DAY   metFORMIN (GLUCOPHAGE) 1000 MG tablet TAKE 1 TABLET TWICE DAILY  WITH  A  MEAL   metoprolol tartrate (LOPRESSOR) 25 MG tablet TAKE 1/2 TABLET TWICE DAILY   Multiple Vitamin (MULTIVITAMIN WITH MINERALS) TABS tablet Take 1 tablet by mouth daily.   omega-3 acid ethyl esters (LOVAZA) 1 g capsule Take 1 g by mouth daily.    pantoprazole (PROTONIX) 40 MG tablet TAKE 1 TABLET TWICE DAILY   pyridoxine (B-6) 100 MG tablet Take 100 mg by mouth daily.   sertraline (ZOLOFT) 100 MG tablet TAKE 1/2 TABLET EVERY DAY   vitamin C (ASCORBIC ACID) 500 MG tablet Take 500 mg by mouth daily.   vitamin E 400 UNIT capsule Take 400 Units by mouth daily.   No facility-administered encounter medications on file as of 02/10/2022.   Care Gaps: Diabetic Kidney Evaluation Hepatitis C Screening Z[place] Vaccines Diabetic Foot Exam Diabetic Eye Exam Tetanus/TDAP  Star Rating Drugs: Atorvastatin 40 mg last filled on 01/05/2022 for a 90-Day supply with [place] Losartan 50 mg last filled on 12/29/2021 for a 90-Day supply with [place] Metformin 1000 mg last filled on 01/31/2022 for a 90-Day supply with [place]: Lab Results  Component Value Date/Time   HGBA1C 7.0 (A) 01/19/2022 04:36 PM  HGBA1C 7.8 (H) 10/14/2021 02:48 PM   HGBA1C 7.2 (A) 04/12/2021 03:09 PM   HGBA1C 7.6 (H) 09/07/2020 06:41 AM   MICROALBUR 100 04/23/2017 09:31 AM   MICROALBUR Negative 03/30/2015 02:56 PM    Kidney Function Lab Results  Component Value Date/Time   CREATININE 1.20 (H) 10/14/2021 02:48 PM   CREATININE 1.11 (H) 08/05/2021 01:43 PM   CREATININE 0.92 09/04/2014 09:16 AM   CREATININE 0.89 02/18/2014 10:48 AM   GFRNONAA 51 (L) 08/05/2021 01:43 PM   GFRNONAA >60 09/04/2014 09:16 AM   GFRNONAA  >60 02/18/2014 10:48 AM   GFRAA 83 05/31/2020 02:30 PM   GFRAA >60 09/04/2014 09:16 AM   GFRAA >60 02/18/2014 10:48 AM   Current antihyperglycemic regimen:  Metformin 1000 mg 1 tablet twice daily  What recent interventions/DTPs have been made to improve glycemic control:  None ID  Have there been any recent hospitalizations or ED visits since last visit with CPP? No  Patient denies hypoglycemic symptoms, including Pale, Sweaty, Shaky, Hungry, Nervous/irritable, and Vision changes Patient denies hyperglycemic symptoms, including blurry vision, excessive thirst, fatigue, polyuria, and weakness  How often are you checking your blood sugar? once daily normally but per patient she has not taken it today but she denies any ill symptoms at this time.  What are your blood sugars ranging?  Fasting: Patient did not take it this morning but she reports it runs typically 130-150  During the week, how often does your blood glucose drop below 70? Never Are you checking your feet daily/regularly? No  Adherence Review: Is the patient currently on a STATIN medication? Yes Is the patient currently on ACE/ARB medication? Yes Does the patient have >5 day gap between last estimated fill dates? No  I spoke to the patient and she reports that today she is doing well. Patient denies any ill symptoms at this time. Patient reports taking all medications as directed with no problems. Patient denies needing any refills today. Inquired about patient's elevated blood pressure when she saw Oncology and she stated today she feels fine and no symptoms of hypertension. Patient has no concerns or issues today. Patient encouraged to give me a call if she needs anything.    Patient has a telephone appointment with [name], CPP on 06/12/2022 @ 1500.  [name], CPA/CMA Clinical Pharmacist Assistant Phone: [phone]

## 2022-03-08 ENCOUNTER — Telehealth: Payer: Self-pay

## 2022-03-08 NOTE — Progress Notes (Signed)
Chronic Care Management Pharmacy Assistant   Name: Leah Schaefer  MRN: [id number] DOB: 29-May-1941  Reason for Encounter: Diabetes Disease State Call   Recent office visits:  None ID  Recent consult visits:  None ID  Hospital visits:  None in previous 6 months  Medications: Outpatient Encounter Medications as of 03/08/2022  Medication Sig   ACCU-CHEK AVIVA PLUS test strip CHECK  FASTING  BLOOD  GLUCOSE EVERY DAY   Accu-Chek Softclix Lancets lancets TEST BLOOD SUGAR EVERY DAY   acetaminophen (TYLENOL) 500 MG tablet Take 500 mg by mouth every 6 (six) hours as needed for mild pain or headache.    atorvastatin (LIPITOR) 40 MG tablet TAKE 1 TABLET EVERY EVENING   buPROPion (WELLBUTRIN XL) 300 MG 24 hr tablet Take 1 tablet (300 mg total) by mouth daily.   Calcium Carb-Cholecalciferol 600-800 MG-UNIT TABS Take 1 tablet by mouth daily.   chlorthalidone (HYGROTON) 25 MG tablet Take 1 tablet (25 mg total) by mouth daily.   cholecalciferol (VITAMIN D) 1000 units tablet Take 1,000 Units by mouth daily.   clopidogrel (PLAVIX) 75 MG tablet TAKE 1 TABLET EVERY DAY   cyanocobalamin 500 MCG tablet Take 500 mcg by mouth daily.   gabapentin (NEURONTIN) 300 MG capsule TAKE 2 CAPSULES AT BEDTIME   latanoprost (XALATAN) 0.005 % ophthalmic solution Place 1 drop into both eyes at bedtime.    letrozole (FEMARA) 2.5 MG tablet TAKE 1 TABLET (2.5 MG TOTAL) BY MOUTH DAILY. (Patient not taking: Reported on 02/03/2022)   losartan (COZAAR) 50 MG tablet TAKE 1 TABLET (50 MG TOTAL) DAILY.   meclizine (ANTIVERT) 25 MG tablet Take 0.5 tablets (12.5 mg total) by mouth 3 (three) times daily as needed for dizziness.   meloxicam (MOBIC) 15 MG tablet TAKE 1 TABLET EVERY DAY   metFORMIN (GLUCOPHAGE) 1000 MG tablet TAKE 1 TABLET TWICE DAILY  WITH  A  MEAL   metoprolol tartrate (LOPRESSOR) 25 MG tablet TAKE 1/2 TABLET TWICE DAILY   Multiple Vitamin (MULTIVITAMIN WITH MINERALS) TABS tablet Take 1 tablet by mouth daily.    omega-3 acid ethyl esters (LOVAZA) 1 g capsule Take 1 g by mouth daily.    pantoprazole (PROTONIX) 40 MG tablet TAKE 1 TABLET TWICE DAILY   pyridoxine (B-6) 100 MG tablet Take 100 mg by mouth daily.   sertraline (ZOLOFT) 100 MG tablet TAKE 1/2 TABLET EVERY DAY   vitamin C (ASCORBIC ACID) 500 MG tablet Take 500 mg by mouth daily.   vitamin E 400 UNIT capsule Take 400 Units by mouth daily.   No facility-administered encounter medications on file as of 03/08/2022.   Care Gaps: Diabetic Kidney Evaluation Hepatitis C Screening Zoster Vaccines Diabetic Foot Exam Diabetic Eye Exam Tetanus/TDAP  Star Rating Drugs: Atorvastatin 40 mg last filled on 01/05/2022 for a 90-Day supply with [place] Losartan 50 mg last filled on 12/29/2021 for a 90-Day supply with [place] Metformin 1000 mg last filled on 01/31/2022 for a 90-Day supply with [place]: Lab Results  Component Value Date/Time   HGBA1C 7.0 (A) 01/19/2022 04:36 PM   HGBA1C 7.8 (H) 10/14/2021 02:48 PM   HGBA1C 7.2 (A) 04/12/2021 03:09 PM   HGBA1C 7.6 (H) 09/07/2020 06:41 AM   MICROALBUR 100 04/23/2017 09:31 AM   MICROALBUR Negative 03/30/2015 02:56 PM    Kidney Function Lab Results  Component Value Date/Time   CREATININE 1.20 (H) 10/14/2021 02:48 PM   CREATININE 1.11 (H) 08/05/2021 01:43 PM  CREATININE 0.92 09/04/2014 09:16 AM   CREATININE 0.89 02/18/2014 10:48 AM   GFRNONAA 51 (L) 08/05/2021 01:43 PM   GFRNONAA >60 09/04/2014 09:16 AM   GFRNONAA >60 02/18/2014 10:48 AM   GFRAA 83 05/31/2020 02:30 PM   GFRAA >60 09/04/2014 09:16 AM   GFRAA >60 02/18/2014 10:48 AM   Current antihyperglycemic regimen:  Metformin 1000 mg 1 tablet twice daily  What recent interventions/DTPs have been made to improve glycemic control:  None ID  Have there been any recent hospitalizations or ED visits since last visit with CPP? No  Patient denies hypoglycemic symptoms, including Pale, Sweaty, Shaky,  Hungry, Nervous/irritable, and Vision changes Patient reports hyperglycemic symptoms, including fatigue How often are you checking your blood sugar? once daily What are your blood sugars ranging?  Fasting: Patient reports her number this morning was 129 she denies any thing below 70 and no numbers over 200  During the week, how often does your blood glucose drop below 70? Never Are you checking your feet daily/regularly? Yes  Adherence Review: Is the patient currently on a STATIN medication? Yes Is the patient currently on ACE/ARB medication? Yes Does the patient have >5 day gap between last estimated fill dates? No  I spoke with the patient, and the patient stated she feels okay for the most part. Patient stated that although her energy level is better than usual today she does report frequent fatigue. Patient stated that she is just tired of taking medications, but denies experiencing any side effects. Per patient she is just tired of being on so many medications.  Patient also expresses that she lost her drivers license a year ago, and it makes her sad that she is unable to drive. She reports that she has to depend on her daughter to take her places, but her daughter works during the week. Patient does have an upcoming birthday, and per patient she doesn't want to go anywhere she just wants to stay home. Patient reports having anxiety and just not having the desire to leave her home. Patient stated she likes her home, and watching TV.   Although, patient reports not liking to take all the medications she does report she is compliant with taking them as directed. Patient stated she has not had her diabetic eye exam this year as she does have to wait on someone to be able to take her to her appointment. Patient has no other concerns or issues, and encouraged to give me a call if she needs anything.   Patient has a telephone follow-up with [name], CPP On 06/12/2022 @ 1500.  [name],  CPA/CMA Clinical Pharmacist Assistant Phone: [phone]

## 2022-03-13 ENCOUNTER — Other Ambulatory Visit: Payer: Self-pay | Admitting: Family Medicine

## 2022-03-13 DIAGNOSIS — I10 Essential (primary) hypertension: Secondary | ICD-10-CM

## 2022-03-22 ENCOUNTER — Other Ambulatory Visit: Payer: Self-pay | Admitting: Family Medicine

## 2022-03-22 DIAGNOSIS — I10 Essential (primary) hypertension: Secondary | ICD-10-CM

## 2022-03-22 NOTE — Telephone Encounter (Signed)
Requested Prescriptions  Pending Prescriptions Disp Refills  . atorvastatin (LIPITOR) 40 MG tablet [Pharmacy Med Name: ATORVASTATIN CALCIUM 40 MG Tablet] 90 tablet 0    Sig: TAKE 1 TABLET EVERY EVENING     Cardiovascular:  Antilipid - Statins Failed - 03/22/2022 10:39 AM      Failed - Lipid Panel in normal range within the last 12 months    Cholesterol, Total  Date Value Ref Range Status  10/14/2021 110 100 - 199 mg/dL Final   LDL Chol Calc (NIH)  Date Value Ref Range Status  10/14/2021 51 0 - 99 mg/dL Final   HDL  Date Value Ref Range Status  10/14/2021 33 (L) >39 mg/dL Final   Triglycerides  Date Value Ref Range Status  10/14/2021 151 (H) 0 - 149 mg/dL Final         Passed - Patient is not pregnant      Passed - Valid encounter within last 12 months    Recent Outpatient Visits          2 months ago Controlled type 2 diabetes mellitus without complication, without long-term current use of insulin ([place])   [facility] Jerrol Banana., MD   5 months ago Essential (primary) hypertension   [facility] Jerrol Banana., MD   11 months ago Type 2 diabetes mellitus with complication, without long-term current use of insulin [facility])   [facility] Jerrol Banana., MD   1 year ago Type 2 diabetes mellitus with complication, without long-term current use of insulin [facility])   [facility] Jerrol Banana., MD   1 year ago Urinary tract infection without hematuria, site unspecified   [facility] Flinchum, Kelby Aline, FNP      Future Appointments            In 2 months Jerrol Banana., MD [facility]   In 3 months Stoioff, Ronda Fairly, MD [place]           . sertraline (ZOLOFT) 100 MG tablet [Pharmacy Med Name: SERTRALINE HYDROCHLORIDE 100 MG Tablet] 45 tablet 0    Sig: TAKE 1/2 TABLET EVERY DAY     Psychiatry:  Antidepressants - SSRI  - sertraline Passed - 03/22/2022 10:39 AM      Passed - AST in normal range and within 360 days    AST  Date Value Ref Range Status  10/14/2021 19 0 - 40 IU/L Final   SGOT(AST)  Date Value Ref Range Status  09/04/2014 21 15 - 37 Unit/L Final         Passed - ALT in normal range and within 360 days    ALT  Date Value Ref Range Status  10/14/2021 15 0 - 32 IU/L Final   SGPT (ALT)  Date Value Ref Range Status  09/04/2014 34 14 - 63 U/L Final         Passed - Completed PHQ-2 or PHQ-9 in the last 360 days      Passed - Valid encounter within last 6 months    Recent Outpatient Visits          2 months ago Controlled type 2 diabetes mellitus without complication, without long-term current use of insulin [facility])   [facility] Jerrol Banana., MD   5 months ago Essential (primary) hypertension   [facility] Jerrol Banana., MD   11 months ago Type 2  diabetes mellitus with complication, without long-term current use of insulin [facility])   [facility] Jerrol Banana., MD   1 year ago Type 2 diabetes mellitus with complication, without long-term current use of insulin [facility])   [facility] Jerrol Banana., MD   1 year ago Urinary tract infection without hematuria, site unspecified   [facility] Flinchum, Kelby Aline, FNP      Future Appointments            In 2 months Jerrol Banana., MD [facility]   In 3 months Stoioff, Ronda Fairly, MD [place]           . Accu-Chek Softclix Lancets lancets [Pharmacy Med Name: ACCU-CHEK SOFTCLIX LANCETS] 100 each 0    Sig: TEST BLOOD SUGAR EVERY DAY     Endocrinology: Diabetes - Testing Supplies Passed - 03/22/2022 10:39 AM      Passed - Valid encounter within last 12 months    Recent Outpatient Visits          2 months ago Controlled type 2 diabetes mellitus without complication, without long-term  current use of insulin [facility])   [facility] Jerrol Banana., MD   5 months ago Essential (primary) hypertension   [facility] Jerrol Banana., MD   11 months ago Type 2 diabetes mellitus with complication, without long-term current use of insulin [facility])   [facility] Jerrol Banana., MD   1 year ago Type 2 diabetes mellitus with complication, without long-term current use of insulin [facility])   [facility] Jerrol Banana., MD   1 year ago Urinary tract infection without hematuria, site unspecified   [facility] Flinchum, Kelby Aline, FNP      Future Appointments            In 2 months Jerrol Banana., MD [facility]   In 3 months Stoioff, Ronda Fairly, MD [place]           . metoprolol tartrate (LOPRESSOR) 25 MG tablet [Pharmacy Med Name: METOPROLOL TARTRATE 25 MG Tablet] 90 tablet 0    Sig: TAKE 1/2 TABLET TWICE DAILY     Cardiovascular:  Beta Blockers Failed - 03/22/2022 10:39 AM      Failed - Last BP in normal range    BP Readings from Last 1 Encounters:  02/03/22 (!) 150/84         Passed - Last Heart Rate in normal range    Pulse Readings from Last 1 Encounters:  02/03/22 79         Passed - Valid encounter within last 6 months    Recent Outpatient Visits          2 months ago Controlled type 2 diabetes mellitus without complication, without long-term current use of insulin [facility]   [facility] Jerrol Banana., MD   5 months ago Essential (primary) hypertension   [facility] Jerrol Banana., MD   11 months ago Type 2 diabetes mellitus with complication, without long-term current use of insulin [facility])   [facility] Jerrol Banana., MD   1 year ago Type 2 diabetes mellitus with complication, without long-term current use of insulin [facility])   [facility]  Jerrol Banana., MD   1 year ago Urinary tract infection without hematuria, site unspecified  [facility] Flinchum, Kelby Aline, FNP      Future Appointments            In 2 months Jerrol Banana., MD [facility]   In 3 months Stoioff, Ronda Fairly, MD [facility]           . ACCU-CHEK AVIVA PLUS test strip [Pharmacy Med Name: ACCU-CHEK AVIVA PLUS   Strip] 100 strip 0    Sig: [place] DAY     Endocrinology: Diabetes - Testing Supplies Passed - 03/22/2022 10:39 AM      Passed - Valid encounter within last 12 months    Recent Outpatient Visits          2 months ago Controlled type 2 diabetes mellitus without complication, without long-term current use of insulin [facility])   [facility] Jerrol Banana., MD   5 months ago Essential (primary) hypertension   [facility] Jerrol Banana., MD   11 months ago Type 2 diabetes mellitus with complication, without long-term current use of insulin [facility])   [facility] Jerrol Banana., MD   1 year ago Type 2 diabetes mellitus with complication, without long-term current use of insulin [facility])   [facility] Jerrol Banana., MD   1 year ago Urinary tract infection without hematuria, site unspecified   [facility] Flinchum, Kelby Aline, FNP      Future Appointments            In 2 months Jerrol Banana., MD [facility] [place]   In 3 months [place], Ronda Fairly, MD [facility]

## 2022-03-23 ENCOUNTER — Ambulatory Visit
Admission: RE | Admit: 2022-03-23 | Discharge: 2022-03-23 | Disposition: A | Discharge status: Home / Self Care | Payer: Medicare HMO | Source: Ambulatory Visit | Attending: General Surgery | Admitting: General Surgery

## 2022-03-23 DIAGNOSIS — Z1231 Encounter for screening mammogram for malignant neoplasm of breast: Secondary | ICD-10-CM | POA: Diagnosis not present

## 2022-03-23 DIAGNOSIS — Z853 Personal history of malignant neoplasm of breast: Secondary | ICD-10-CM | POA: Diagnosis not present

## 2022-03-30 DIAGNOSIS — Z853 Personal history of malignant neoplasm of breast: Secondary | ICD-10-CM | POA: Diagnosis not present

## 2022-04-17 ENCOUNTER — Other Ambulatory Visit: Payer: Self-pay | Admitting: Family Medicine

## 2022-04-17 DIAGNOSIS — E119 Type 2 diabetes mellitus without complications: Secondary | ICD-10-CM

## 2022-04-19 ENCOUNTER — Telehealth: Payer: Self-pay

## 2022-04-19 NOTE — Progress Notes (Signed)
Chronic Care Management Pharmacy Assistant   Name: JERMEKA SCHLOTTERBECK  MRN: [id number] DOB: [birth year]  Reason for Encounter: Diabetes Disease State Call   Recent office visits:  None ID  Recent consult visits:  03/30/2022 [name], MD (General Surgery) for follow-up- No medication changes noted, No orders placed, No follow-up noted  Hospital visits:  None in previous 6 months  Medications: Outpatient Encounter Medications as of 04/19/2022  Medication Sig   ACCU-CHEK AVIVA PLUS test strip CHECK  FASTING  BLOOD  GLUCOSE EVERY DAY   Accu-Chek Softclix Lancets lancets TEST BLOOD SUGAR EVERY DAY   acetaminophen (TYLENOL) 500 MG tablet Take 500 mg by mouth every 6 (six) hours as needed for mild pain or headache.    atorvastatin (LIPITOR) 40 MG tablet TAKE 1 TABLET EVERY EVENING   buPROPion (WELLBUTRIN XL) 300 MG 24 hr tablet Take 1 tablet (300 mg total) by mouth daily.   Calcium Carb-Cholecalciferol 600-800 MG-UNIT TABS Take 1 tablet by mouth daily.   chlorthalidone (HYGROTON) 25 MG tablet Take 1 tablet (25 mg total) by mouth daily.   cholecalciferol (VITAMIN D) 1000 units tablet Take 1,000 Units by mouth daily.   clopidogrel (PLAVIX) 75 MG tablet TAKE 1 TABLET EVERY DAY   cyanocobalamin 500 MCG tablet Take 500 mcg by mouth daily.   gabapentin (NEURONTIN) 300 MG capsule TAKE 2 CAPSULES AT BEDTIME   latanoprost (XALATAN) 0.005 % ophthalmic solution Place 1 drop into both eyes at bedtime.    letrozole (FEMARA) 2.5 MG tablet TAKE 1 TABLET (2.5 MG TOTAL) BY MOUTH DAILY. (Patient not taking: Reported on 02/03/2022)   losartan (COZAAR) 50 MG tablet TAKE 1 TABLET EVERY DAY   meclizine (ANTIVERT) 25 MG tablet Take 0.5 tablets (12.5 mg total) by mouth 3 (three) times daily as needed for dizziness.   meloxicam (MOBIC) 15 MG tablet TAKE 1 TABLET EVERY DAY   metFORMIN (GLUCOPHAGE) 1000 MG tablet TAKE 1 TABLET TWICE DAILY WITH A MEAL   metoprolol tartrate (LOPRESSOR) 25 MG tablet TAKE  1/2 TABLET TWICE DAILY   Multiple Vitamin (MULTIVITAMIN WITH MINERALS) TABS tablet Take 1 tablet by mouth daily.   omega-3 acid ethyl esters (LOVAZA) 1 g capsule Take 1 g by mouth daily.    pantoprazole (PROTONIX) 40 MG tablet TAKE 1 TABLET TWICE DAILY   pyridoxine (B-6) 100 MG tablet Take 100 mg by mouth daily.   sertraline (ZOLOFT) 100 MG tablet TAKE 1/2 TABLET EVERY DAY   vitamin C (ASCORBIC ACID) 500 MG tablet Take 500 mg by mouth daily.   vitamin E 400 UNIT capsule Take 400 Units by mouth daily.   No facility-administered encounter medications on file as of 04/19/2022.   Care Gaps: Zoster Vaccine Diabetic Foot Exam Diabetic Kidney Evaluation Diabetic Eye Exam Tetanus/TDAP Influenza Vaccine  Star Rating Drugs: Atorvastatin 40 mg last filled on 01/05/2022 for a 90-Day supply with [place] Losartan 50 mg last filled on 03/14/2022 for a 90-Day supply with [place] Metformin 1000 mg last filled on 01/31/2022 for a 90-Day supply with [place]: Lab Results  Component Value Date/Time   HGBA1C 7.0 (A) 01/19/2022 04:36 PM   HGBA1C 7.8 (H) 10/14/2021 02:48 PM   HGBA1C 7.2 (A) 04/12/2021 03:09 PM   HGBA1C 7.6 (H) 09/07/2020 06:41 AM   MICROALBUR 100 04/23/2017 09:31 AM   MICROALBUR Negative 03/30/2015 02:56 PM    Kidney Function Lab Results  Component Value Date/Time   CREATININE 1.20 (H) 10/14/2021 02:48  PM   CREATININE 1.11 (H) 08/05/2021 01:43 PM   CREATININE 0.92 09/04/2014 09:16 AM   CREATININE 0.89 02/18/2014 10:48 AM   GFRNONAA 51 (L) 08/05/2021 01:43 PM   GFRNONAA >60 09/04/2014 09:16 AM   GFRNONAA >60 02/18/2014 10:48 AM   GFRAA 83 05/31/2020 02:30 PM   GFRAA >60 09/04/2014 09:16 AM   GFRAA >60 02/18/2014 10:48 AM   Current antihyperglycemic regimen:  Metformin 1000 mg 1 tablet twice daily  What recent interventions/DTPs have been made to improve glycemic control:  None ID  Have there been any recent hospitalizations or  ED visits since last visit with CPP? No  Patient denies hypoglycemic symptoms, including Pale, Sweaty, Shaky, Hungry, Nervous/irritable, and Vision changes  Patient denies hyperglycemic symptoms, including blurry vision, excessive thirst, fatigue, polyuria, and weakness  How often are you checking your blood sugar? once daily  What are your blood sugars ranging?  Fasting: 141  During the week, how often does your blood glucose drop below 70? Never Are you checking your feet daily/regularly? Yes  Adherence Review: Is the patient currently on a STATIN medication? Yes Is the patient currently on ACE/ARB medication? Yes Does the patient have >5 day gap between last estimated fill dates? No   I spoke with the patient and she reports for the most part she is doing well. Patient states her only issue is low energy. Patient stated she just doesn't feel like getting out and doing anything. Per patient she would like to take a walk but right now it's too hot for her and she doesn't feel like doing any other exercising.   Patient reports her blood sugars are doing good. Per patient she doesn't have the same appetite she used to have and food just doesn't taste the same to her anymore. Patient reports that since she lives alone she really doesn't like to cook either. Patient stated last night for dinner she had 2 tomato sandwiches and that was filling enough for her. Patient states she doesn't like taking medications, but she is compliant with taking the ones that has been prescribed to her. Patient stated the medications she is currently taking works for her and she has no desire to try anything new. Patient has no concerns or issues today. Patient denies any ill symptoms at this time. She is encouraged to give me a call if she needs anything.   Patient has a follow-up telephone appointment with [name], CPP on 06/12/2022@ 1500.  [name], CPA/CMA Clinical Pharmacist Assistant Phone:  [phone]

## 2022-04-21 DIAGNOSIS — Z91148 Patient's other noncompliance with medication regimen for other reason: Secondary | ICD-10-CM | POA: Diagnosis not present

## 2022-04-21 DIAGNOSIS — Z113 Encounter for screening for infections with a predominantly sexual mode of transmission: Secondary | ICD-10-CM | POA: Diagnosis not present

## 2022-04-21 DIAGNOSIS — Z118 Encounter for screening for other infectious and parasitic diseases: Secondary | ICD-10-CM | POA: Diagnosis not present

## 2022-04-21 DIAGNOSIS — I69354 Hemiplegia and hemiparesis following cerebral infarction affecting left non-dominant side: Secondary | ICD-10-CM | POA: Diagnosis not present

## 2022-04-21 DIAGNOSIS — E118 Type 2 diabetes mellitus with unspecified complications: Secondary | ICD-10-CM | POA: Diagnosis not present

## 2022-04-21 DIAGNOSIS — F0280 Dementia in other diseases classified elsewhere without behavioral disturbance: Secondary | ICD-10-CM | POA: Diagnosis not present

## 2022-04-21 DIAGNOSIS — G939 Disorder of brain, unspecified: Secondary | ICD-10-CM | POA: Diagnosis not present

## 2022-04-21 DIAGNOSIS — F0150 Vascular dementia without behavioral disturbance: Secondary | ICD-10-CM | POA: Diagnosis not present

## 2022-04-21 DIAGNOSIS — I6930 Unspecified sequelae of cerebral infarction: Secondary | ICD-10-CM | POA: Diagnosis not present

## 2022-04-21 DIAGNOSIS — G309 Alzheimer's disease, unspecified: Secondary | ICD-10-CM | POA: Diagnosis not present

## 2022-04-26 ENCOUNTER — Other Ambulatory Visit: Payer: Self-pay | Admitting: Family Medicine

## 2022-04-26 DIAGNOSIS — K2970 Gastritis, unspecified, without bleeding: Secondary | ICD-10-CM

## 2022-05-22 NOTE — Progress Notes (Unsigned)
I,Leah Schaefer,acting as a scribe for [employer], MD.,have documented all relevant documentation on the behalf of Leah Foster, MD,as directed by  Leah Foster, MD while in the presence of Leah Foster, MD.   Complete physical exam   Patient: Leah Schaefer   DOB: 09-09-1940   81 y.o. Female  MRN: [id number] Visit Date: 05/23/2022  Today's healthcare provider: [name], MD   Chief Complaint  Patient presents with   Annual Exam   Subjective    Leah Schaefer is a 81 y.o. female who presents today for a complete physical exam.   She reports consuming a general diet. The patient does not participate in regular exercise at present. She generally feels fairly well. She reports sleeping fairly well. She does not have additional problems to discuss today.  HPI  Had AWV done with [facility] 10/25/2021.  Vaccines Reports she had up to date covid vaccine last year  She is agreeable to influenza vaccine today  We discussed recommendations for shingles vaccine  Depressed affect Patient was observed by clinical staff to have negative interaction with her daughter Her daughter was observed yelling profanities at the patient The patient states that this is her daughter's normal attitude and she denies physical abuse or activities that will be considered elder abuse She reports not interacting with many members of her family Patient states that she has not been able to drive as recommended by her physicians and states that this has limited her quality of life greatly She would like to speak with a therapist in person if possible We discussed enrolling in programs that will provide transportation to activities during the day, patient is agreeable to this Patient states that she often feels sad due to not being able to be as independent in relation to her health and not being able to drive She reports that she had a good marriage  and misses her husband and does not get to interact with her family as much as she would like   Hypertension Patient reports adherence to her medications She denies headache or dizziness with medications She is agreeable to complete metabolic panel today   Diabetes Last A1c was at goal at 7.0 She will continue on diet Coralyn Mark management of this She is agreeable to diabetes foot exam She denies pain in her feet   Past Medical History:  Diagnosis Date   Anxiety    Aortic atherosclerosis ([place])    Arthritis    Breast cancer ([place]) 2013   LEFT breast with chemo and rad tx   Breast cancer ([place]) 2017   right breast with rad tx   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 Chemotherapy/post wide excision whole breast radiation.   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    Personal history of chemotherapy 2013  LEFT BREAST   Personal history of malignant neoplasm of breast    Personal history of radiation therapy 2017    Rt, 2013 had on left   Stroke ([place]) 09/06/2020   1 cm acute infarction in the right parietal deep and subcortical white matter   Vertigo    Past Surgical History:  Procedure Laterality Date   BREAST BIOPSY Left 07/18/2011   +   BREAST BIOPSY Left 09/21/2015   neg   BREAST BIOPSY Left 04/05/2016   neg   BREAST BIOPSY Left 03/20/2019   [place] bx 2 areas /fat necrosis at  9:30 ribbon and 5:30 coil   BREAST EXCISIONAL BIOPSY Right 08/2015   [facility]   BREAST LUMPECTOMY Right 09/30/2015   [facility] WITH MICROPAPILLARY FEATURES AND DCIS/CLEAR MARGINS, NEGATIVE LN   BREAST LUMPECTOMY Left 08/17/2011   BREAST LUMPECTOMY WITH SENTINEL LYMPH NODE BIOPSY Right 09/30/2015   Procedure: BREAST LUMPECTOMY WITH SENTINEL LYMPH NODE BX;  Surgeon: Robert Bellow, MD;  Location: [facility] ORS;  Service: General;  Laterality: Right;   BREAST SURGERY Left 2012   wide excision   BREAST SURGERY Left November 2013   Core biopsy of the upper-outer quadrant showed fat necrosis.   CHOLECYSTECTOMY  2007   COLONOSCOPY  2011   Dr Candace Cruise   COLONOSCOPY WITH PROPOFOL N/A 02/25/2015   Procedure: COLONOSCOPY WITH PROPOFOL;  Surgeon: Hulen Luster, MD;  Location: [facility] ENDOSCOPY;  Service: Gastroenterology;  Laterality: N/A;   CYSTOSCOPY WITH STENT PLACEMENT Left 11/05/2020   Procedure: CYSTOSCOPY WITH STENT PLACEMENT;  Surgeon: Abbie Sons, MD;  Location: [facility] ORS;  Service: Urology;  Laterality: Left;   CYSTOSCOPY/URETEROSCOPY/HOLMIUM LASER/STENT PLACEMENT Left 12/07/2020   Procedure: CYSTOSCOPY/URETEROSCOPY/HOLMIUM LASER/STENT PLACEMENT;  Surgeon: Abbie Sons, MD;  Location: [facility] ORS;  Service: Urology;  Laterality: Left;   DILATION AND CURETTAGE OF UTERUS  2004   HERNIA REPAIR Right 75/17/0017   Umbilical/ventral hernia repaired with 6.4 cm Proceed ventral patch   PORT-A-CATH REMOVAL     PORTACATH PLACEMENT  2013   RECURRENT HERNIA N/A 01/07/2008   Recurrent ventral hernia at the umbilicus, laparoscopy, open placement of a large Ultra Pro mesh with trans-fascial sutures.   UPPER GI ENDOSCOPY  2011   Social History   Socioeconomic History   Marital status: Widowed    Spouse name: Not on file   Number of children: 2   Years of education: H/S   Highest education level: 12th grade  Occupational History   Occupation: Retired  Tobacco Use   Smoking status: Never   Smokeless tobacco: Never   Vaping Use   Vaping Use: Never used  Substance and Sexual Activity   Alcohol use: No   Drug use: No   Sexual activity: Not on file  Other Topics Concern   Not on file  Social History Narrative   Lives alone   Social Determinants of Health   Financial Resource Strain: Low Risk  (10/25/2021)   Overall Financial Resource Strain (CARDIA)    Difficulty of Paying Living Expenses: Not hard at all  Food Insecurity: No Food Insecurity (10/25/2021)   Hunger Vital Sign    Worried About Running Out of Food in the Last Year: Never true    Ran Out of Food in the Last Year: Never true  Transportation Needs: No Transportation Needs (10/25/2021)   PRAPARE - Hydrologist (Medical): No    Lack of Transportation (Non-Medical): No  Physical Activity: Insufficiently Active (10/25/2021)  Exercise Vital Sign    Days of Exercise per Week: 2 days    Minutes of Exercise per Session: 20 min  Stress: No Stress Concern Present (10/25/2021)   [place]    Feeling of Stress : Not at all  Social Connections: Moderately Isolated (10/25/2021)   Social Connection and Isolation Panel [NHANES]    Frequency of Communication with Friends and Family: Three times a week    Frequency of Social Gatherings with Friends and Family: Never    Attends Religious Services: 1 to 4 times per year    Active Member of [employer] or Organizations: No    Attends Archivist Meetings: Never    Marital Status: Widowed  Intimate Partner Violence: Not At Risk (10/25/2021)   Humiliation, Afraid, Rape, and Kick questionnaire    Fear of Current or Ex-Partner: No    Emotionally Abused: No    Physically Abused: No    Sexually Abused: No   Family Status  Relation Name Status   Father  Deceased   Mother  Deceased at age 24       Died from old age.    Sister  (Not Specified)   Brother  (Not Specified)   Brother  (Not Specified)   Other  nephew Deceased   Other nephew Deceased   Neg Hx  (Not Specified)   Family History  Problem Relation Age of Onset   Lymphoma Father    Ovarian cancer Sister    Colon cancer Brother    Lymphoma Brother    Cancer Other        stomach   Cancer Other        stomach   Breast cancer Neg Hx    No Known Allergies  Patient Care Team: Jerrol Banana., MD as PCP - General (Family Medicine) Byrnett, Forest Gleason, MD (General Surgery) Cammie Sickle, MD as Consulting Physician (Internal Medicine) Lorelee Cover., MD as Consulting Physician (Ophthalmology) Noreene Filbert, MD as Referring Physician (Radiation Oncology) Germaine Pomfret, [facility] as Pharmacist (Pharmacist)   Medications: Outpatient Medications Prior to Visit  Medication Sig   acetaminophen (TYLENOL) 500 MG tablet Take 500 mg by mouth every 6 (six) hours as needed for mild pain or headache.    atorvastatin (LIPITOR) 40 MG tablet TAKE 1 TABLET EVERY EVENING   Calcium Carb-Cholecalciferol 600-800 MG-UNIT TABS Take 1 tablet by mouth daily.   chlorthalidone (HYGROTON) 25 MG tablet Take 1 tablet (25 mg total) by mouth daily.   cholecalciferol (VITAMIN D) 1000 units tablet Take 1,000 Units by mouth daily.   clopidogrel (PLAVIX) 75 MG tablet TAKE 1 TABLET EVERY DAY   cyanocobalamin 500 MCG tablet Take 500 mcg by mouth daily.   gabapentin (NEURONTIN) 300 MG capsule TAKE 2 CAPSULES AT BEDTIME   latanoprost (XALATAN) 0.005 % ophthalmic solution Place 1 drop into both eyes at bedtime.    letrozole (FEMARA) 2.5 MG tablet TAKE 1 TABLET (2.5 MG TOTAL) BY MOUTH DAILY.   losartan (COZAAR) 50 MG tablet TAKE 1 TABLET EVERY DAY   meclizine (ANTIVERT) 25 MG tablet Take 0.5 tablets (12.5 mg total) by mouth 3 (three) times daily as needed for dizziness.   meloxicam (MOBIC) 15 MG tablet TAKE 1 TABLET EVERY DAY   metFORMIN (GLUCOPHAGE) 1000 MG tablet TAKE 1 TABLET TWICE DAILY WITH A MEAL   metoprolol tartrate (LOPRESSOR) 25 MG tablet  TAKE 1/2 TABLET TWICE DAILY   Multiple Vitamin (MULTIVITAMIN  WITH MINERALS) TABS tablet Take 1 tablet by mouth daily.   omega-3 acid ethyl esters (LOVAZA) 1 g capsule Take 1 g by mouth daily.    pantoprazole (PROTONIX) 40 MG tablet TAKE 1 TABLET TWICE DAILY   pyridoxine (B-6) 100 MG tablet Take 100 mg by mouth daily.   sertraline (ZOLOFT) 100 MG tablet TAKE 1/2 TABLET EVERY DAY   vitamin C (ASCORBIC ACID) 500 MG tablet Take 500 mg by mouth daily.   vitamin E 400 UNIT capsule Take 400 Units by mouth daily.   ACCU-CHEK AVIVA PLUS test strip CHECK  FASTING  BLOOD  GLUCOSE EVERY DAY   Accu-Chek Softclix Lancets lancets TEST BLOOD SUGAR EVERY DAY   buPROPion (WELLBUTRIN XL) 300 MG 24 hr tablet Take 1 tablet (300 mg total) by mouth daily.   No facility-administered medications prior to visit.    Review of Systems  Constitutional:  Positive for activity change, chills and diaphoresis.       Irritability  HENT:  Positive for dental problem and hearing loss.   All other systems reviewed and are negative.     Objective    BP 136/81 (BP Location: Right Arm, Patient Position: Sitting, Cuff Size: Large)   Pulse 84   Temp 98.2 F (36.8 C) (Oral)   Resp 16   Ht 5' 6" (1.676 m)   Wt 181 lb 8 oz (82.3 kg)   BMI 29.29 kg/m     Physical Exam Vitals reviewed.  Constitutional:      General: She is not in acute distress.    Appearance: Normal appearance. She is not ill-appearing, toxic-appearing or diaphoretic.  Eyes:     Conjunctiva/sclera: Conjunctivae normal.  Cardiovascular:     Rate and Rhythm: Normal rate and regular rhythm.     Pulses: Normal pulses.     Heart sounds: Normal heart sounds. No murmur heard.    No friction rub. No gallop.  Pulmonary:     Effort: Pulmonary effort is normal. No respiratory distress.     Breath sounds: Normal breath sounds. No stridor. No wheezing, rhonchi or rales.  Neurological:     Mental Status: She is alert and oriented to person, place, and  time.       Last depression screening scores    05/23/2022    3:59 PM 10/25/2021    2:36 PM 04/12/2021    3:08 PM  PHQ 2/9 Scores  PHQ - 2 Score _0 PHQ- 9 Score 5  17   Last fall risk screening    05/23/2022    3:58 PM  Fall Risk   Falls in the past year? 1  Number falls in past yr: 0  Injury with Fall? 0  Risk for fall due to : No Fall Risks   Last Audit-C alcohol use screening    10/25/2021    2:36 PM  Alcohol Use Disorder Test (AUDIT)  1. How often do you have a drink containing alcohol? 1  2. How many drinks containing alcohol do you have on a typical day when you are drinking? 0  3. How often do you have six or more drinks on one occasion? 0  AUDIT-C Score 1   A score of 3 or more in women, and 4 or more in men indicates increased risk for alcohol abuse, EXCEPT if all of the points are from question 1   No results found for any visits on 05/23/22.  Assessment & Plan    Routine Health  Maintenance and Physical Exam  Exercise Activities and Dietary recommendations  Goals      DIET - EAT MORE FRUITS AND VEGETABLES     DIET - INCREASE LEAN PROTEINS     Recommend to continue current diet regimen of eating chicken that is broil or grilled only and avoiding other fatty meats.      Exercise 3x per week (30 min per time)     Recommend to exercise/walk for 3 days a week for at least 30 minutes at a time.       Increase lean proteins     Recommend decreasing fatty meats and increasing lean meats in diet.     Monitor and Manage My Blood Sugar-Diabetes Type 2     Timeframe:  Long-Range Goal Priority:  High Start Date:  02/21/2021                            Expected End Date:  02/22/2023                     Follow Up within 90 days   - check blood sugar at prescribed times - check blood sugar if I feel it is too high or too low - enter blood sugar readings and medication or insulin into daily log    Why is this important?   Checking your blood sugar at home  helps to keep it from getting very high or very low.  Writing the results in a diary or log helps the doctor know how to care for you.  Your blood sugar log should have the time, date and the results.  Also, write down the amount of insulin or other medicine that you take.  Other information, like what you ate, exercise done and how you were feeling, will also be helpful.     Notes:         Immunization History  Administered Date(s) Administered   Fluad Quad(high Dose 65+) 06/24/2019, 05/03/2020, 05/11/2021, 05/23/2022   Influenza, High Dose Seasonal PF 05/13/2014, 05/29/2015, 04/05/2016, 04/19/2017, 04/11/2018   Influenza,inj,Quad PF,6+ Mos 05/23/2013   PFIZER(Purple Top)SARS-COV-2 Vaccination 10/17/2019, 11/11/2019, 07/07/2020   Pneumococcal Conjugate-13 05/13/2014   Pneumococcal Polysaccharide-23 06/07/2012   Td 04/19/2007, 05/05/2011   Tdap 05/05/2011   Zoster, Live 04/27/2008    Health Maintenance  Topic Date Due   Zoster Vaccines- Shingrix (1 of 2) Never done   Diabetic kidney evaluation - Urine ACR  04/23/2018   OPHTHALMOLOGY EXAM  01/10/2019   COVID-19 Vaccine (4 - Pfizer risk series) 09/01/2020   TETANUS/TDAP  05/04/2021   HEMOGLOBIN A1C  07/21/2022   Diabetic kidney evaluation - GFR measurement  10/15/2022   FOOT EXAM  05/24/2023   Pneumonia Vaccine 106+ Years old  Completed   INFLUENZA VACCINE  Completed   DEXA SCAN  Completed   HPV VACCINES  Aged Out    Discussed health benefits of physical activity, and encouraged her to engage in regular exercise appropriate for her age and condition.  Problem List Items Addressed This Visit       Cardiovascular and Mediastinum   Essential (primary) hypertension - Primary    Chronic, controlled  She will continue chlorthalidone 25 mg, losartan 50 mg and metoprolol 25 mg Recommend follow-up in 3 months for blood pressure We will check CMP today       Relevant Orders   Comprehensive metabolic panel     Endocrine  Type 2 diabetes mellitus with complication, without long-term current use of insulin (HCC)    Chronic, controlled Lab Results  Component Value Date   HGBA1C 7.0 (A) 01/19/2022    A1C at goal of 7, 3 months ago She will continue metformin 1000 twice daily Can consider repeat hemoglobin A1c if 1 month follow-up for mood Diabetes foot exam completed today Patient will have urine microalbumin at next visit          Other   Need for influenza vaccination    Influenza vaccine administered today       Relevant Orders   Flu Vaccine QUAD High Dose(Fluad) (Completed)   Depressed affect    Patient observed to have negative interaction with her daughter while in the waiting room including her daughter yelling per MDs at the patient Patient states that she feels safe and has not suffered any physical diabetes related to her daughter's outbursts Patient is enrolled with chronic care management, will plan to contact them to help with enrolling patient in activity programs for seniors during the day as patient can no longer drive and this limits her ability to participate in things that she enjoys Follow-up in 63month      Annual physical exam     Return in about 1 month (around 06/23/2022) for Mood.     I, MEulis Foster MD, have reviewed all documentation for this visit. The documentation on 05/23/22 for the exam, diagnosis, procedures, and orders are all accurate and complete.    MEulis Foster MD  [phone](phone) [phone](fax)  [facility]

## 2022-05-23 ENCOUNTER — Ambulatory Visit (INDEPENDENT_AMBULATORY_CARE_PROVIDER_SITE_OTHER): Payer: Medicare HMO | Admitting: Family Medicine

## 2022-05-23 ENCOUNTER — Encounter: Payer: Self-pay | Admitting: Family Medicine

## 2022-05-23 ENCOUNTER — Ambulatory Visit: Payer: Medicare HMO | Admitting: Family Medicine

## 2022-05-23 VITALS — BP 136/81 | HR 84 | Temp 98.2°F | Resp 16 | Ht 66.0 in | Wt 181.5 lb

## 2022-05-23 DIAGNOSIS — I10 Essential (primary) hypertension: Secondary | ICD-10-CM | POA: Diagnosis not present

## 2022-05-23 DIAGNOSIS — Z0000 Encounter for general adult medical examination without abnormal findings: Secondary | ICD-10-CM | POA: Insufficient documentation

## 2022-05-23 DIAGNOSIS — R4589 Other symptoms and signs involving emotional state: Secondary | ICD-10-CM

## 2022-05-23 DIAGNOSIS — Z23 Encounter for immunization: Secondary | ICD-10-CM | POA: Diagnosis not present

## 2022-05-23 DIAGNOSIS — E118 Type 2 diabetes mellitus with unspecified complications: Secondary | ICD-10-CM

## 2022-05-23 NOTE — Patient Instructions (Signed)
It was a pleasure to see you today!  Thank you for choosing [facility] for your primary care.   Leah Schaefer was seen for follow up for chronic diseases.   Our plans for today were: Please continue your current medications. I will contact our chronic care management team to see if they can assist with programs to help you with increasing activities outside of the home and socializing to help with your mood.   To keep you healthy, please keep in mind the following health maintenance items that you are due for:   Shingles Vaccine  COVID vaccine  Influenza Vaccine  Tetanus Vaccine  Diabetes Eye Exam    You should return to our clinic in 1 month for follow up on mood, chronic conditions.   Best Wishes,   Dr. Quentin Cornwall

## 2022-05-23 NOTE — Assessment & Plan Note (Signed)
Chronic, controlled Lab Results  Component Value Date   HGBA1C 7.0 (A) 01/19/2022    A1C at goal of 7, 3 months ago She will continue metformin 1000 twice daily Can consider repeat hemoglobin A1c if 1 month follow-up for mood Diabetes foot exam completed today Patient will have urine microalbumin at next visit

## 2022-05-23 NOTE — Assessment & Plan Note (Signed)
Influenza vaccine administered today.

## 2022-05-23 NOTE — Assessment & Plan Note (Signed)
Chronic, controlled  She will continue chlorthalidone 25 mg, losartan 50 mg and metoprolol 25 mg Recommend follow-up in 3 months for blood pressure We will check CMP today

## 2022-05-23 NOTE — Assessment & Plan Note (Addendum)
Patient observed to have negative interaction with her daughter while in the waiting room including her daughter yelling per MDs at the patient Patient states that she feels safe and has not suffered any physical diabetes related to her daughter's outbursts Patient is enrolled with chronic care management, will plan to contact them to help with enrolling patient in activity programs for seniors during the day as patient can no longer drive and this limits her ability to participate in things that she enjoys Follow-up in 14month

## 2022-05-24 ENCOUNTER — Ambulatory Visit: Payer: Self-pay | Admitting: *Deleted

## 2022-05-24 ENCOUNTER — Other Ambulatory Visit: Payer: Self-pay

## 2022-05-24 LAB — COMPREHENSIVE METABOLIC PANEL
ALT: 8 IU/L (ref 0–32)
AST: 14 IU/L (ref 0–40)
Albumin/Globulin Ratio: 1.7 (ref 1.2–2.2)
Albumin: 4.7 g/dL (ref 3.8–4.8)
Alkaline Phosphatase: 80 IU/L (ref 44–121)
BUN/Creatinine Ratio: 13 (ref 12–28)
BUN: 12 mg/dL (ref 8–27)
Bilirubin Total: 0.3 mg/dL (ref 0.0–1.2)
CO2: 24 mmol/L (ref 20–29)
Calcium: 10.5 mg/dL — ABNORMAL HIGH (ref 8.7–10.3)
Chloride: 100 mmol/L (ref 96–106)
Creatinine, Ser: 0.94 mg/dL (ref 0.57–1.00)
Globulin, Total: 2.8 g/dL (ref 1.5–4.5)
Glucose: 107 mg/dL — ABNORMAL HIGH (ref 70–99)
Potassium: 4 mmol/L (ref 3.5–5.2)
Sodium: 140 mmol/L (ref 134–144)
Total Protein: 7.5 g/dL (ref 6.0–8.5)
eGFR: 61 mL/min/{1.73_m2} (ref >59)

## 2022-05-24 NOTE — Telephone Encounter (Signed)
Reason for Disposition . [1] Follow-up call to recent contact AND [2] information only call, no triage required  Answer Assessment - Initial Assessment Questions 1. REASON FOR CALL or QUESTION: "What is your reason for calling today?" or "How can I best help you?" or "What question do you have that I can help answer?"     Daughter, Ashok Cordia called in and was given the lab result message from Dr. Alba Cory dated 05/24/2022 at 8:34 AM.  Verbalized understanding that blood work was needed in a month for calcium recheck.  Protocols used: Information Only Call - No Triage-A-AH

## 2022-05-25 NOTE — Addendum Note (Signed)
Addended by: Adolph Pollack on: 05/25/2022 01:16 PM   Modules accepted: Orders

## 2022-05-26 ENCOUNTER — Other Ambulatory Visit: Payer: Self-pay | Admitting: Family Medicine

## 2022-05-26 DIAGNOSIS — M545 Low back pain, unspecified: Secondary | ICD-10-CM

## 2022-05-26 NOTE — Telephone Encounter (Signed)
Requested medication (s) are due for refill today: yes  Requested medication (s) are on the active medication list: yes  Last refill:  Mobic 12/21/20 #90 with 1 RF (states daily, then outside of signature states prn)                  Plavix 04/06/21 #90 with 0 RF (must not be taking any longer)  Future visit scheduled: yes, 08/2022, just seen 05/23/22  Notes to clinic:  Mobic has confusing signature, plavix should have been out a year ago, please assess.      Requested Prescriptions  Pending Prescriptions Disp Refills   clopidogrel (PLAVIX) 75 MG tablet [Pharmacy Med Name: CLOPIDOGREL 75 MG Tablet] 90 tablet 10    Sig: TAKE 1 TABLET EVERY DAY     Hematology: Antiplatelets - clopidogrel Failed - 05/26/2022  1:30 PM      Failed - HCT in normal range and within 180 days    Hematocrit  Date Value Ref Range Status  10/14/2021 39.1 34.0 - 46.6 % Final         Failed - HGB in normal range and within 180 days    Hemoglobin  Date Value Ref Range Status  10/14/2021 13.3 11.1 - 15.9 g/dL Final         Failed - PLT in normal range and within 180 days    Platelets  Date Value Ref Range Status  10/14/2021 284 150 - 450 x10E3/uL Final         Passed - Cr in normal range and within 360 days    Creatinine  Date Value Ref Range Status  09/04/2014 0.92 0.60 - 1.30 mg/dL Final   Creatinine, Ser  Date Value Ref Range Status  05/23/2022 0.94 0.57 - 1.00 mg/dL Final         Passed - Valid encounter within last 6 months    Recent Outpatient Visits           3 days ago Essential (primary) hypertension   [employer], Financial risk analyst, MD   4 months ago Controlled type 2 diabetes mellitus without complication, without long-term current use of insulin ([place])   [facility] Jerrol Banana., MD   7 months ago Essential (primary) hypertension   [facility] Jerrol Banana., MD   1 year ago Type 2 diabetes mellitus with  complication, without long-term current use of insulin [facility])   [facility] Jerrol Banana., MD   1 year ago Type 2 diabetes mellitus with complication, without long-term current use of insulin [facility])   [facility] Jerrol Banana., MD       Future Appointments             In 1 month Stoioff, Ronda Fairly, MD [place]   In 2 months Simmons-Robinson, [place], MD [facility], [facility]             meloxicam (MOBIC) 15 MG tablet [Pharmacy Med Name: MELOXICAM 15 MG Tablet] 90 tablet 10    Sig: TAKE 1 TABLET EVERY DAY AS NEEDED FOR PAIN     Analgesics:  COX2 Inhibitors Failed - 05/26/2022  1:30 PM      Failed - Manual Review: Labs are only required if the patient has taken medication for more than 8 weeks.      Passed - HGB in normal range and within 360 days    Hemoglobin  Date Value Ref  Range Status  10/14/2021 13.3 11.1 - 15.9 g/dL Final         Passed - Cr in normal range and within 360 days    Creatinine  Date Value Ref Range Status  09/04/2014 0.92 0.60 - 1.30 mg/dL Final   Creatinine, Ser  Date Value Ref Range Status  05/23/2022 0.94 0.57 - 1.00 mg/dL Final         Passed - HCT in normal range and within 360 days    Hematocrit  Date Value Ref Range Status  10/14/2021 39.1 34.0 - 46.6 % Final         Passed - AST in normal range and within 360 days    AST  Date Value Ref Range Status  05/23/2022 14 0 - 40 IU/L Final   SGOT(AST)  Date Value Ref Range Status  09/04/2014 21 15 - 37 Unit/L Final         Passed - ALT in normal range and within 360 days    ALT  Date Value Ref Range Status  05/23/2022 8 0 - 32 IU/L Final   SGPT (ALT)  Date Value Ref Range Status  09/04/2014 34 14 - 63 U/L Final         Passed - eGFR is 30 or above and within 360 days    EGFR (African American)  Date Value Ref Range Status  09/04/2014 >60 >61m/min Final  02/18/2014 >60  Final   GFR calc Af Amer  Date  Value Ref Range Status  05/31/2020 83 >59 mL/min/1.73 Final    Comment:    **In accordance with recommendations from the NKF-ASN Task force,**   [facility] is in the process of updating its eGFR calculation to the   2021 CKD-EPI creatinine equation that estimates kidney function   without a race variable.    EGFR (Non-African Amer.)  Date Value Ref Range Status  09/04/2014 >60 >664mmin Final    Comment:    eGFR values <6054min/1.73 m2 may be an indication of chronic kidney disease (CKD). Calculated eGFR, using the MRDR Study equation, is useful in  patients with stable renal function. The eGFR calculation will not be reliable in acutely ill patients when serum creatinine is changing rapidly. It is not useful in patients on dialysis. The eGFR calculation may not be applicable to patients at the low and high extremes of body sizes, pregnant women, and vegetarians.   02/18/2014 >60  Final    Comment:    eGFR values <2m83mn/1.73 m2 may be an indication of chronic kidney disease (CKD). Calculated eGFR is useful in patients with stable renal function. The eGFR calculation will not be reliable in acutely ill patients when serum creatinine is changing rapidly. It is not useful in  patients on dialysis. The eGFR calculation may not be applicable to patients at the low and high extremes of body sizes, pregnant women, and vegetarians.    GFR, Estimated  Date Value Ref Range Status  08/05/2021 51 (L) >60 mL/min Final    Comment:    (NOTE) Calculated using the CKD-EPI Creatinine Equation (2021)    eGFR  Date Value Ref Range Status  05/23/2022 61 >59 mL/min/1.73 Final         Passed - Patient is not pregnant      Passed - Valid encounter within last 12 months    Recent Outpatient Visits           3 days ago Essential (primary) hypertension   [employer]  Simmons-Robinson, Makiera, MD   4 months ago Controlled type 2 diabetes mellitus without complication,  without long-term current use of insulin [facility])   [facility] Jerrol Banana., MD   7 months ago Essential (primary) hypertension   [facility] Jerrol Banana., MD   1 year ago Type 2 diabetes mellitus with complication, without long-term current use of insulin [facility])   [facility] Jerrol Banana., MD   1 year ago Type 2 diabetes mellitus with complication, without long-term current use of insulin [facility])   [facility] Jerrol Banana., MD       Future Appointments             In 1 month Stoioff, Ronda Fairly, MD [place]   In 2 months Simmons-Robinson, Riki Sheer, MD [facility], [place]

## 2022-06-06 ENCOUNTER — Telehealth: Payer: Self-pay

## 2022-06-06 NOTE — Chronic Care Management (AMB) (Signed)
  Chronic Care Management   Outreach Note  06/06/2022 Name: RANELLE AUKER MRN: [id number] DOB: 05-Mar-1941  LAVONA NORSWORTHY is a 81 y.o. year old female who is a primary care patient of Simmons-Robinson, Riki Sheer, MD. I reached out to [name] by phone today in response to a referral sent by Ms. Nelwyn Salisbury Pepper's primary care provider.  An unsuccessful telephone outreach was attempted today to contact the patient about Chronic Care Management needs.    Follow Up Plan: A HIPAA compliant phone message was left for the patient providing contact information and requesting a return call.  The care management team will reach out to the patient again over the next 7 days.  If patient returns call to provider office, please advise to call [place] * at [phone]*  Noreene Larsson, [place], [place] [address] Direct Dial: [phone] [email or web address]

## 2022-06-09 DIAGNOSIS — F0280 Dementia in other diseases classified elsewhere without behavioral disturbance: Secondary | ICD-10-CM | POA: Diagnosis not present

## 2022-06-09 DIAGNOSIS — F0150 Vascular dementia without behavioral disturbance: Secondary | ICD-10-CM | POA: Diagnosis not present

## 2022-06-09 DIAGNOSIS — G939 Disorder of brain, unspecified: Secondary | ICD-10-CM | POA: Diagnosis not present

## 2022-06-09 DIAGNOSIS — E118 Type 2 diabetes mellitus with unspecified complications: Secondary | ICD-10-CM | POA: Diagnosis not present

## 2022-06-09 DIAGNOSIS — Z79899 Other long term (current) drug therapy: Secondary | ICD-10-CM | POA: Diagnosis not present

## 2022-06-09 DIAGNOSIS — G309 Alzheimer's disease, unspecified: Secondary | ICD-10-CM | POA: Diagnosis not present

## 2022-06-09 DIAGNOSIS — I6930 Unspecified sequelae of cerebral infarction: Secondary | ICD-10-CM | POA: Diagnosis not present

## 2022-06-09 DIAGNOSIS — Z91148 Patient's other noncompliance with medication regimen for other reason: Secondary | ICD-10-CM | POA: Diagnosis not present

## 2022-06-12 ENCOUNTER — Ambulatory Visit (INDEPENDENT_AMBULATORY_CARE_PROVIDER_SITE_OTHER): Payer: Medicare HMO

## 2022-06-12 DIAGNOSIS — E118 Type 2 diabetes mellitus with unspecified complications: Secondary | ICD-10-CM

## 2022-06-12 DIAGNOSIS — K2970 Gastritis, unspecified, without bleeding: Secondary | ICD-10-CM

## 2022-06-12 DIAGNOSIS — I10 Essential (primary) hypertension: Secondary | ICD-10-CM

## 2022-06-12 NOTE — Progress Notes (Signed)
Chronic Care Management Pharmacy Note  06/15/2022 Name:  Leah Schaefer MRN:  [id number] DOB:  07/29/1941  Summary: Patient presents for CCM follow-up.   -Patient with excellent control of GERD symptoms, denies any breakthrough symptoms. Patient interested in tapering if possible.  Patient has multiple medication discrepancies and medications that were not filled in 2023. Extensive medication reconciliation was performed today.   Recommendations/Changes made from today's visit: STOP Chlorthalidone, STOP Gabapentin, STOP Lovaza, STOP Letrozole (patient has not filled any of these in 2023).   DECREASE Pantoprazole to 40 mg daily    Plan: CPP follow-up in 6 months.  Subjective: Leah Schaefer is an 81 y.o. year old female who is a primary patient of Simmons-Robinson, Riki Sheer, MD.  The CCM team was consulted for assistance with disease management and care coordination needs.    Engaged with patient by telephone for follow up visit in response to provider referral for pharmacy case management and/or care coordination services.   Consent to Services:  The patient was given information about Chronic Care Management services, agreed to services, and gave verbal consent prior to initiation of services.  Please see initial visit note for detailed documentation.   Patient Care Team: [name], MD as PCP - General (Family Medicine) Bary Castilla, Forest Gleason, MD (General Surgery) Cammie Sickle, MD as Consulting Physician (Internal Medicine) Lorelee Cover., MD as Consulting Physician (Ophthalmology) Noreene Filbert, MD as Referring Physician (Radiation Oncology) Germaine Pomfret, [facility] as Pharmacist (Pharmacist)  Recent office visits: 05/23/22: Patient presented to Dr. Quentin Cornwall for follow-up.   02/03/22: Patient presented to Beckey Rutter, NP (Oncology)  Recent consult visits: 06/09/22: Patient presented to Bethena Roys, PA (Neurology) for dementia.  04/21/22: Patient  presented to Dr. Manuella Ghazi (Neurology) for dementia.   Hospital visits: None in previous 6 months   Objective:  Lab Results  Component Value Date   CREATININE 0.94 05/23/2022   BUN 12 05/23/2022   GFRNONAA 51 (L) 08/05/2021   GFRAA 83 05/31/2020   NA 140 05/23/2022   K 4.0 05/23/2022   CALCIUM 10.5 (H) 05/23/2022   CO2 24 05/23/2022   GLUCOSE 107 (H) 05/23/2022    Lab Results  Component Value Date/Time   HGBA1C 7.0 (A) 01/19/2022 04:36 PM   HGBA1C 7.8 (H) 10/14/2021 02:48 PM   HGBA1C 7.2 (A) 04/12/2021 03:09 PM   HGBA1C 7.6 (H) 09/07/2020 06:41 AM   MICROALBUR 100 04/23/2017 09:31 AM   MICROALBUR Negative 03/30/2015 02:56 PM    Last diabetic Eye exam:  Lab Results  Component Value Date/Time   HMDIABEYEEXA No Retinopathy 01/09/2018 12:00 AM    Last diabetic Foot exam:  Lab Results  Component Value Date/Time   HMDIABFOOTEX normal 03/22/2017 12:00 AM     Lab Results  Component Value Date   CHOL 110 10/14/2021   HDL 33 (L) 10/14/2021   LDLCALC 51 10/14/2021   TRIG 151 (H) 10/14/2021   CHOLHDL 3.3 10/14/2021       Latest Ref Rng & Units 05/23/2022    4:28 PM 10/14/2021    2:48 PM 08/05/2021    1:43 PM  Hepatic Function  Total Protein 6.0 - 8.5 g/dL 7.5  7.2  7.7   Albumin 3.8 - 4.8 g/dL 4.7  4.5  4.3   AST 0 - 40 IU/L _0 ALT 0 - 32 IU/L _1 Alk Phosphatase 44 - 121 IU/L 80  68  67  Total Bilirubin 0.0 - 1.2 mg/dL 0.3  <0.2  0.2     Lab Results  Component Value Date/Time   TSH 2.110 10/14/2021 02:48 PM   TSH 1.760 05/31/2020 02:30 PM       Latest Ref Rng & Units 10/14/2021    2:48 PM 08/05/2021    1:43 PM 01/19/2021    9:09 AM  CBC  WBC 3.4 - 10.8 x10E3/uL 7.7  7.0  6.6   Hemoglobin 11.1 - 15.9 g/dL 13.3  13.5  13.2   Hematocrit 34.0 - 46.6 % 39.1  39.8  38.8   Platelets 150 - 450 x10E3/uL 284  249  273     No results found for: "VD25OH"  Clinical ASCVD: Yes  The ASCVD Risk score (Arnett DK, et al., 2019) failed to calculate  for the following reasons:   The 2019 ASCVD risk score is only valid for ages 61 to 48   The patient has a prior MI or stroke diagnosis       05/23/2022    3:59 PM 10/25/2021    2:36 PM 04/12/2021    3:08 PM  Depression screen PHQ 2/9  Decreased Interest 0 1 2  Down, Depressed, Hopeless _0 PHQ - 2 Score _1 Altered sleeping 0  3  Tired, decreased energy 1 0 2  Change in appetite 0 0 2  Feeling bad or failure about yourself  1 0 2  Trouble concentrating _2 Moving slowly or fidgety/restless 0 0 1  Suicidal thoughts 1 0 0  PHQ-9 Score 5  17  Difficult doing work/chores Somewhat difficult Not difficult at all Somewhat difficult    Social History   Tobacco Use  Smoking Status Never  Smokeless Tobacco Never   BP Readings from Last 3 Encounters:  05/23/22 136/81  02/03/22 (!) 150/84  01/19/22 133/82   Pulse Readings from Last 3 Encounters:  05/23/22 84  02/03/22 79  01/19/22 87   Wt Readings from Last 3 Encounters:  05/23/22 181 lb 8 oz (82.3 kg)  02/03/22 187 lb (84.8 kg)  01/19/22 180 lb (81.6 kg)   BMI Readings from Last 3 Encounters:  05/23/22 29.29 kg/m  02/03/22 30.18 kg/m  01/19/22 29.05 kg/m    Assessment/Interventions: Review of patient past medical history, allergies, medications, health status, including review of consultants reports, laboratory and other test data, was performed as part of comprehensive evaluation and provision of chronic care management services.   SDOH:  (Social Determinants of Health) assessments and interventions performed: Yes SDOH Interventions    Flowsheet Row Office Visit from 05/23/2022 in [place] from 10/25/2021 in [place] Management from 06/20/2021 in [place] Visit from 04/12/2021 in [place] Management from 02/21/2021 in [place] from 01/14/2019 in [place] Interventions -- Intervention Not Indicated -- -- -- --  Housing Interventions -- Intervention Not Indicated -- -- -- --  Transportation Interventions -- Intervention Not Indicated -- -- -- --  Depression Interventions/Treatment  Currently on Treatment Medication -- Medication -- PHQ2-9 Score <4 Follow-up Not Indicated, Currently on Treatment  Financial Strain Interventions -- Intervention Not Indicated Intervention Not Indicated -- Intervention Not Indicated --  Physical Activity Interventions -- Intervention Not Indicated -- -- -- --  Stress Interventions -- Intervention Not Indicated -- -- -- --  Social Connections Interventions -- Intervention Not Indicated -- -- -- --        SDOH Screenings   Food Insecurity: No Food Insecurity (10/25/2021)  Housing: Low Risk  (10/25/2021)  Transportation Needs: No Transportation Needs (10/25/2021)  Alcohol Screen: Low Risk  (10/25/2021)  Depression (PHQ2-9): Medium Risk (05/23/2022)  Financial Resource Strain: Low Risk  (10/25/2021)  Physical Activity: Insufficiently Active (10/25/2021)  Social Connections: Moderately Isolated (10/25/2021)  Stress: No Stress Concern Present (10/25/2021)  Tobacco Use: Low Risk  (05/23/2022)    [place]  No Known Allergies  Medications Reviewed Today     Reviewed by Germaine Pomfret, [facility] (Pharmacist) on 06/15/22 at 1318  Med List Status: <None>   Medication Order Taking? Sig Documenting Provider Last Dose Status Informant  ACCU-CHEK AVIVA PLUS test strip [id number]  CHECK  FASTING  BLOOD  GLUCOSE EVERY DAY Jerrol Banana., MD  Active   Accu-Chek Softclix Lancets lancets [id number]  TEST BLOOD SUGAR EVERY DAY Jerrol Banana., MD  Active   acetaminophen (TYLENOL) 500 MG tablet [id number]  Take 500 mg by mouth every 6 (six) hours as needed for mild pain or headache.  [provider]  Active Self  atorvastatin (LIPITOR) 40 MG tablet  [id number]  TAKE 1 TABLET EVERY EVENING Jerrol Banana., MD  Active   buPROPion (WELLBUTRIN XL) 300 MG 24 hr tablet [id number]  Take 1 tablet (300 mg total) by mouth daily. Jerrol Banana., MD  Active   Calcium Carb-Cholecalciferol 600-800 MG-UNIT TABS [id number]  Take 1 tablet by mouth daily. [provider]  Active Self  cholecalciferol (VITAMIN D) 1000 units tablet [id number]  Take 1,000 Units by mouth daily. [provider]  Active Self  clopidogrel (PLAVIX) 75 MG tablet [id number] Yes TAKE 1 TABLET EVERY DAY Simmons-Robinson, Makiera, MD Taking Active   cyanocobalamin 500 MCG tablet [id number]  Take 500 mcg by mouth daily. [provider]  Active Self  latanoprost (XALATAN) 0.005 % ophthalmic solution [id number]  Place 1 drop into both eyes at bedtime.  [provider]  Active Self    Discontinued 06/15/22 1318 (Patient Preference) losartan (COZAAR) 50 MG tablet [id number]  TAKE 1 TABLET EVERY DAY Jerrol Banana., MD  Active   meclizine (ANTIVERT) 25 MG tablet [id number]  Take 0.5 tablets (12.5 mg total) by mouth 3 (three) times daily as needed for dizziness. Hinda Kehr, MD  Active Self  metFORMIN (GLUCOPHAGE) 1000 MG tablet [id number]  TAKE 1 TABLET TWICE DAILY WITH A MEAL Jerrol Banana., MD  Active   metoprolol tartrate (LOPRESSOR) 25 MG tablet [id number]  TAKE 1/2 TABLET TWICE DAILY Jerrol Banana., MD  Active   Multiple Vitamin (MULTIVITAMIN WITH MINERALS) TABS tablet [id number]  Take 1 tablet by mouth daily. [provider]  Active Self  pantoprazole (PROTONIX) 40 MG tablet [id number]  Take 1 tablet (40 mg total) by mouth daily. Simmons-Robinson, Makiera, MD  Active   pyridoxine (B-6) 100 MG tablet [id number]  Take 100 mg by mouth daily. [provider]  Active Self  sertraline (ZOLOFT) 100 MG tablet [id number]  TAKE 1/2 TABLET EVERY DAY Jerrol Banana., MD  Active   vitamin C (ASCORBIC ACID) 500 MG tablet  [id number]  Take 500 mg by mouth daily. [provider]  Active Self  vitamin E 400 UNIT capsule [id number]  Take 400 Units by mouth daily. [provider]  Active Self  Patient Active Problem List   Diagnosis Date Noted   Need for influenza vaccination 05/23/2022   Depressed affect 05/23/2022   Annual physical exam 05/23/2022   Type 2 diabetes mellitus with complication, without long-term current use of insulin ([place]) 12/17/2020   Severe sepsis ([place]) 56/43/3295   Complicated UTI (urinary tract infection) 11/05/2020   Hydronephrosis with renal and ureteral calculus obstruction 11/05/2020   History of breast cancer 11/05/2020   History of CVA (cerebrovascular accident) 11/05/2020   AKI (acute kidney injury) ([place]) 11/05/2020   Hemiparesis affecting left side as late effect of stroke ([place]) 11/05/2020   Postural dizziness with presyncope 11/05/2020   Stroke ([place]) 09/06/2020   Leukocytes in urine 06/09/2020   Grief 06/09/2020   Episode of recurrent major depressive disorder ([place]) 06/09/2020   Urinary symptom or sign 06/09/2020   Left sided numbness 11/06/2018   Fat necrosis of breast 18/84/1660   Umbilical pain 63/08/6008   Breast mass, left 04/07/2016   Allergic rhinitis 03/30/2015   Anxiety 03/30/2015   Carotid arterial disease ([place]) 03/30/2015   Diabetes mellitus, type 2 ([place]) 03/30/2015   Essential (primary) hypertension 03/30/2015   Hypercholesteremia 03/30/2015   Left leg pain 03/30/2015   LBP (low back pain) 03/30/2015   Neuropathic pain 03/30/2015   Burning or prickling sensation 03/30/2015   Neuralgia neuritis, sciatic nerve 03/30/2015   Skin cyst 11/24/2014   Carcinoma of upper-outer quadrant of right breast in female, estrogen receptor positive ([place]) 08/17/2011   Allergic reaction 11/29/2009   Adaptation reaction 04/30/2009   Acid reflux 04/30/2009   Stricture and stenosis of cervix 01/28/2009    Immunization History  Administered  Date(s) Administered   Fluad Quad(high Dose 65+) 06/24/2019, 05/03/2020, 05/11/2021, 05/23/2022   Influenza, High Dose Seasonal PF 05/13/2014, 05/29/2015, 04/05/2016, 04/19/2017, 04/11/2018   Influenza,inj,Quad PF,6+ Mos 05/23/2013   PFIZER(Purple Top)SARS-COV-2 Vaccination 10/17/2019, 11/11/2019, 07/07/2020   Pneumococcal Conjugate-13 05/13/2014   Pneumococcal Polysaccharide-23 06/07/2012   Td 04/19/2007, 05/05/2011   Tdap 05/05/2011   Zoster, Live 04/27/2008    Conditions to be addressed/monitored:  Hypertension, Hyperlipidemia, Diabetes, and Coronary Artery Disease  Care Plan : General Pharmacy (Adult)  Updates made by Germaine Pomfret, [place] since 06/15/2022 12:00 AM     Problem: Hypertension, Hyperlipidemia, Diabetes, and Coronary Artery Disease   Priority: High     Long-Range Goal: Patient-Specific Goal   Start Date: 02/21/2021  Expected End Date: 06/16/2023  This Visit's Progress: On track  Recent Progress: On track  Priority: High  Note:   Current Barriers:  No barriers noted  Pharmacist Clinical Goal(s):  Patient will maintain control of blood pressure as evidenced by BP less than 140/90  through collaboration with PharmD and provider.   Interventions: 1:1 collaboration with Jerrol Banana., MD regarding development and update of comprehensive plan of care as evidenced by provider attestation and co-signature Inter-disciplinary care team collaboration (see longitudinal plan of care) Comprehensive medication review performed; medication list updated in electronic medical record  Hypertension (BP goal <140/90) -Controlled -Current treatment: Losartan 50 mg daily  Metoprolol tartrate 25 mg 1/2 tablet twice daily  -Medications previously tried: NA  -Current home readings: 132/74,  -Patient has not had chlorthalidone filled in some time. Mild swelling in left foot.  -Drinks mainly water, Coke zero  -Recommended to continue current  medication  Hyperlipidemia: (LDL goal < 70) -Controlled: Not addressed during this visit. -History of stroke, carotid arterial disease  -Current treatment: Atorvastatin 40 mg daily -Current treatment: Clopidogrel 75 mg  daily -Medications previously tried: NA -Recommended to continue current medication  Diabetes (A1c goal <8%) -Controlled -Current medications: Metformin 1000 mg twice daily  -Medications previously tried: NA  -Current home glucose readings fasting glucose: 151, 130,  -Denies hypoglycemic/hyperglycemic symptoms -Recommended to continue current medication  Depression/Anxiety (Goal: maintain symptom remission) -Controlled -Current treatment: Bupropion XL 300 mg daily  Sertraline 100 mg 1/2 tablet daily  -Medications previously tried/failed: NA -PHQ9: 3 -Some depressed mood, anhedonia. Overall feels her symptoms  -Continue current medications  GERD (Goal: Prevent reflux) -Controlled -Current treatment  Pantoprazole 40 mg twice daily  -Medications previously tried: NA -Patient with excellent control of GERD symptoms, denies any breakthrough symptoms. Patient interested in tapering if possible. -DECREASE Pantoprazole to 40 mg daily.   Chronic Kidney Disease Stage 3a  -All medications assessed for renal dosing and appropriateness in chronic kidney disease. -Recommended to continue current medication  Patient Goals/Self-Care Activities Patient will:  - check glucose daily before breakfast, document, and provide at future appointments check blood pressure weekly, document, and provide at future appointments  Follow Up Plan: Telephone follow up appointment with care management team member scheduled for:  12/11/2022 at 3:00 PM     Medication Assistance: None required.  Patient affirms current coverage meets needs.  Compliance/Adherence/Medication fill history: Care Gaps: Hepatitis C  Shingrix Foot Exam Ophthalmology Exam  Covid  Booster Influenza  Star-Rating Drugs: Atorvastatin 40 mg daily: LF 10/21/21 for 90-DS Losartan 50 mg daily: LF 10/12/21 for 90-DS Metformin 1000 mg: LF 11/15/21 for 90-DS   Patient's preferred pharmacy is:  [facility] [place], [place] [address] Phone: [phone] Fax: [phone]  [facility] [address] [place] - [place]) [place] [address] Phone: [phone] Fax: [phone]  Uses pill box? Yes Pt endorses 100% compliance  We discussed: Current pharmacy is preferred with insurance plan and patient is satisfied with pharmacy services Patient decided to: Continue current medication management strategy  Care Plan and Follow Up Patient Decision:  Patient agrees to Care Plan and Follow-up.  Plan: Telephone follow up appointment with care management team member scheduled for:  12/11/2022 at 3:00 PM  [name], PharmD, [place], [place] [phone]

## 2022-06-15 ENCOUNTER — Telehealth: Payer: Self-pay

## 2022-06-15 MED ORDER — PANTOPRAZOLE SODIUM 40 MG PO TBEC: 40.0000 mg | DELAYED_RELEASE_TABLET | Freq: Every day | ORAL | 1 refills | Status: DC

## 2022-06-15 NOTE — Patient Instructions (Signed)
Visit Information It was great speaking with you today!  Please let me know if you have any questions about our visit.   Goals Addressed             This Visit's Progress    Monitor and Manage My Blood Sugar-Diabetes Type 2   On track    Timeframe:  Long-Range Goal Priority:  High Start Date:  02/21/2021                            Expected End Date:  02/22/2023                     Follow Up within 90 days   - check blood sugar at prescribed times - check blood sugar if I feel it is too high or too low - enter blood sugar readings and medication or insulin into daily log    Why is this important?   Checking your blood sugar at home helps to keep it from getting very high or very low.  Writing the results in a diary or log helps the doctor know how to care for you.  Your blood sugar log should have the time, date and the results.  Also, write down the amount of insulin or other medicine that you take.  Other information, like what you ate, exercise done and how you were feeling, will also be helpful.     Notes:         Patient Care Plan: General Pharmacy (Adult)     Problem Identified: Hypertension, Hyperlipidemia, Diabetes, and Coronary Artery Disease   Priority: High     Long-Range Goal: Patient-Specific Goal   Start Date: 02/21/2021  Expected End Date: 06/16/2023  This Visit's Progress: On track  Recent Progress: On track  Priority: High  Note:   Current Barriers:  No barriers noted  Pharmacist Clinical Goal(s):  Patient will maintain control of blood pressure as evidenced by BP less than 140/90  through collaboration with PharmD and provider.   Interventions: 1:1 collaboration with Jerrol Banana., MD regarding development and update of comprehensive plan of care as evidenced by provider attestation and co-signature Inter-disciplinary care team collaboration (see longitudinal plan of care) Comprehensive medication review performed; medication list  updated in electronic medical record  Hypertension (BP goal <140/90) -Controlled -Current treatment: Losartan 50 mg daily  Metoprolol tartrate 25 mg 1/2 tablet twice daily  -Medications previously tried: NA  -Current home readings: 132/74,  -Patient has not had chlorthalidone filled in some time. Mild swelling in left foot.  -Drinks mainly water, Coke zero  -Recommended to continue current medication  Hyperlipidemia: (LDL goal < 70) -Controlled: Not addressed during this visit. -History of stroke, carotid arterial disease  -Current treatment: Atorvastatin 40 mg daily -Current treatment: Clopidogrel 75 mg daily -Medications previously tried: NA -Recommended to continue current medication  Diabetes (A1c goal <8%) -Controlled -Current medications: Metformin 1000 mg twice daily  -Medications previously tried: NA  -Current home glucose readings fasting glucose: 151, 130,  -Denies hypoglycemic/hyperglycemic symptoms -Recommended to continue current medication  Depression/Anxiety (Goal: maintain symptom remission) -Controlled -Current treatment: Bupropion XL 300 mg daily  Sertraline 100 mg 1/2 tablet daily  -Medications previously tried/failed: NA -PHQ9: 3 -Some depressed mood, anhedonia. Overall feels her symptoms  -Continue current medications  GERD (Goal: Prevent reflux) -Controlled -Current treatment  Pantoprazole 40 mg twice daily  -Medications previously tried: NA -Patient with excellent control  of GERD symptoms, denies any breakthrough symptoms. Patient interested in tapering if possible. -DECREASE Pantoprazole to 40 mg daily.   Chronic Kidney Disease Stage 3a  -All medications assessed for renal dosing and appropriateness in chronic kidney disease. -Recommended to continue current medication  Patient Goals/Self-Care Activities Patient will:  - check glucose daily before breakfast, document, and provide at future appointments check blood pressure weekly,  document, and provide at future appointments  Follow Up Plan: Telephone follow up appointment with care management team member scheduled for:  12/11/2022 at 3:00 PM      Patient agreed to services and verbal consent obtained.   Patient verbalizes understanding of instructions and care plan provided today and agrees to view in [place]. Active MyChart status and patient understanding of how to access instructions and care plan via MyChart confirmed with patient.     [name], PharmD, Para [place], CPP Clinical Pharmacist Practitioner  [facility]  [phone]

## 2022-06-15 NOTE — Telephone Encounter (Signed)
   CCM RN Visit Note   06/15/22 Name: Leah Schaefer MRN: [id number]      DOB: 1940/09/01  Subjective: Leah Schaefer is a 81 y.o. year old female who is a primary care patient of Simmons-Robinson, Makiera, MD. The patient was referred to the Chronic Care Management team for assistance with care management needs subsequent to provider initiation of CCM services and plan of care.      An unsuccessful telephone outreach was attempted today to contact the patient about Chronic Care Management needs.    PLAN A HIPAA compliant voice message was left today requesting a return call.   Horris Latino RN Care Manager/Chronic Care Management [phone]

## 2022-06-20 NOTE — Progress Notes (Signed)
  Chronic Care Management   Note  06/20/2022 Name: Leah Schaefer MRN: [id number] DOB: [birth year]  Leah Schaefer is a 81 y.o. year old female who is a primary care patient of Simmons-Robinson, Riki Sheer, MD. I reached out to [name] by phone today in response to a referral sent by Ms. Hammond PCP.  [name] was not successfully contacted today. A HIPAA compliant voice message was left requesting a return call.   Follow up plan: Additional outreach attempts will be made.  Noreene Larsson, [place],  [address] Direct Dial: [phone] [email or web address]

## 2022-07-05 ENCOUNTER — Telehealth: Payer: Self-pay

## 2022-07-05 NOTE — Telephone Encounter (Signed)
   CCM RN Visit Note   07/05/22 Name: Leah Schaefer MRN: [id number]      DOB: 1940/12/02  Subjective: Leah Schaefer is a 81 y.o. year old female who is a primary care patient of Simmons-Robinson, Makiera, MD. The patient was referred to the Chronic Care Management team for assistance with care management needs subsequent to provider initiation of CCM services and plan of care.      An unsuccessful telephone outreach was attempted today to contact the patient about Chronic Care Management needs.     PLAN: Unable to leave a voice message today.  A member of the care management team will attempt another outreach.  Horris Latino RN Care Manager/Chronic Care Management [phone]

## 2022-07-06 DIAGNOSIS — E785 Hyperlipidemia, unspecified: Secondary | ICD-10-CM

## 2022-07-06 DIAGNOSIS — I129 Hypertensive chronic kidney disease with stage 1 through stage 4 chronic kidney disease, or unspecified chronic kidney disease: Secondary | ICD-10-CM

## 2022-07-11 ENCOUNTER — Telehealth: Payer: Self-pay

## 2022-07-11 NOTE — Progress Notes (Signed)
Chronic Care Management Pharmacy Assistant   Name: Leah Schaefer  MRN: [id number] DOB: 01/29/1941  Reason for Encounter: Diabetes Disease State   Recent office visits:  None ID  Recent consult visits:  None ID  Hospital visits:  None in previous 6 months  Medications: Outpatient Encounter Medications as of 07/11/2022  Medication Sig   ACCU-CHEK AVIVA PLUS test strip CHECK  FASTING  BLOOD  GLUCOSE EVERY DAY   Accu-Chek Softclix Lancets lancets TEST BLOOD SUGAR EVERY DAY   acetaminophen (TYLENOL) 500 MG tablet Take 500 mg by mouth every 6 (six) hours as needed for mild pain or headache.    atorvastatin (LIPITOR) 40 MG tablet TAKE 1 TABLET EVERY EVENING   buPROPion (WELLBUTRIN XL) 300 MG 24 hr tablet Take 1 tablet (300 mg total) by mouth daily.   Calcium Carb-Cholecalciferol 600-800 MG-UNIT TABS Take 1 tablet by mouth daily.   cholecalciferol (VITAMIN D) 1000 units tablet Take 1,000 Units by mouth daily.   clopidogrel (PLAVIX) 75 MG tablet TAKE 1 TABLET EVERY DAY   cyanocobalamin 500 MCG tablet Take 500 mcg by mouth daily.   latanoprost (XALATAN) 0.005 % ophthalmic solution Place 1 drop into both eyes at bedtime.    losartan (COZAAR) 50 MG tablet TAKE 1 TABLET EVERY DAY   meclizine (ANTIVERT) 25 MG tablet Take 0.5 tablets (12.5 mg total) by mouth 3 (three) times daily as needed for dizziness.   metFORMIN (GLUCOPHAGE) 1000 MG tablet TAKE 1 TABLET TWICE DAILY WITH A MEAL   metoprolol tartrate (LOPRESSOR) 25 MG tablet TAKE 1/2 TABLET TWICE DAILY   Multiple Vitamin (MULTIVITAMIN WITH MINERALS) TABS tablet Take 1 tablet by mouth daily.   pantoprazole (PROTONIX) 40 MG tablet Take 1 tablet (40 mg total) by mouth daily.   pyridoxine (B-6) 100 MG tablet Take 100 mg by mouth daily.   sertraline (ZOLOFT) 100 MG tablet TAKE 1/2 TABLET EVERY DAY   vitamin C (ASCORBIC ACID) 500 MG tablet Take 500 mg by mouth daily.   vitamin E 400 UNIT capsule Take 400 Units by mouth daily.   No  facility-administered encounter medications on file as of 07/11/2022.   Care Gaps: Zoster Vaccines Diabetic Kidney Evaluation Diabetic Eye Exam Dtap/Tdap  Star Rating Drugs: Atorvastatin 40 mg last filled on 05/30/2022 for a 90-Day supply with [place] Losartan 50 mg last filled on 05/29/2022 for a 90-Day supply with [place] Metformin 1000 mg last filled on 06/30/2022 for a 90-Day supply with [place]: Lab Results  Component Value Date/Time   HGBA1C 7.0 (A) 01/19/2022 04:36 PM   HGBA1C 7.8 (H) 10/14/2021 02:48 PM   HGBA1C 7.2 (A) 04/12/2021 03:09 PM   HGBA1C 7.6 (H) 09/07/2020 06:41 AM   MICROALBUR 100 04/23/2017 09:31 AM   MICROALBUR Negative 03/30/2015 02:56 PM    Kidney Function Lab Results  Component Value Date/Time   CREATININE 0.94 05/23/2022 04:28 PM   CREATININE 1.20 (H) 10/14/2021 02:48 PM   CREATININE 0.92 09/04/2014 09:16 AM   CREATININE 0.89 02/18/2014 10:48 AM   GFRNONAA 51 (L) 08/05/2021 01:43 PM   GFRNONAA >60 09/04/2014 09:16 AM   GFRNONAA >60 02/18/2014 10:48 AM   GFRAA 83 05/31/2020 02:30 PM   GFRAA >60 09/04/2014 09:16 AM   GFRAA >60 02/18/2014 10:48 AM   Current antihyperglycemic regimen:  Metformin 1000 mg twice daily   What recent interventions/DTPs have been made to improve glycemic control:  None ID  Adherence Review: Is the patient currently  on a STATIN medication? Yes Is the patient currently on ACE/ARB medication? Yes Does the patient have >5 day gap between last estimated fill dates? No  DECREASE Pantoprazole to 40 mg daily   Patient has a follow-up with CPP on 12/11/2022 @ 1500.  [name], CPA/CMA Clinical Pharmacist Assistant Phone: [phone]   12/05 Tried calling patient X 2  but received a busy signal both times. 12/07 Left VM requesting patient to return my call 12/11 I have attempted 3 separate times to contact the patient to complete her monthly call. I have left HIPAA  compliant voicemails requesting a return call from the patient. My attempts have been unsuccessful at this time.

## 2022-07-13 ENCOUNTER — Other Ambulatory Visit: Payer: Self-pay | Admitting: *Deleted

## 2022-07-13 DIAGNOSIS — N201 Calculus of ureter: Secondary | ICD-10-CM

## 2022-07-17 ENCOUNTER — Ambulatory Visit
Admission: RE | Admit: 2022-07-17 | Discharge: 2022-07-17 | Disposition: A | Discharge status: Home / Self Care | Payer: Medicare HMO | Source: Ambulatory Visit | Attending: Urology | Admitting: Urology

## 2022-07-17 ENCOUNTER — Ambulatory Visit: Payer: Medicare HMO | Admitting: Urology

## 2022-07-17 ENCOUNTER — Encounter: Payer: Self-pay | Admitting: Urology

## 2022-07-17 ENCOUNTER — Ambulatory Visit
Admission: RE | Admit: 2022-07-17 | Discharge: 2022-07-17 | Disposition: A | Discharge status: Home / Self Care | Payer: Medicare HMO | Attending: Urology | Admitting: Urology

## 2022-07-17 VITALS — BP 133/80 | HR 89 | Ht 66.0 in | Wt 187.0 lb

## 2022-07-17 DIAGNOSIS — M160 Bilateral primary osteoarthritis of hip: Secondary | ICD-10-CM | POA: Diagnosis not present

## 2022-07-17 DIAGNOSIS — N201 Calculus of ureter: Secondary | ICD-10-CM

## 2022-07-17 DIAGNOSIS — Z09 Encounter for follow-up examination after completed treatment for conditions other than malignant neoplasm: Secondary | ICD-10-CM

## 2022-07-17 DIAGNOSIS — Z87442 Personal history of urinary calculi: Secondary | ICD-10-CM | POA: Diagnosis not present

## 2022-07-17 DIAGNOSIS — H40153 Residual stage of open-angle glaucoma, bilateral: Secondary | ICD-10-CM | POA: Diagnosis not present

## 2022-07-17 DIAGNOSIS — H524 Presbyopia: Secondary | ICD-10-CM | POA: Diagnosis not present

## 2022-07-17 NOTE — Progress Notes (Signed)
07/17/2022 11:06 AM   Leah Schaefer 1941/04/08 [id number]  Referring provider: Jerrol Banana., MD No address on file  Chief Complaint  Patient presents with   Nephrolithiasis    Urologic history 1.  History urinary calculi Urgent stent placement 11/06/2020 for 10 mm UPJ calculus with infection CT with additional lower pole calculi Ureteroscopy/laser lithotripsy of left renal calculi 12/07/2020 Stone analysis 100% carbonate apatite Metabolic evaluation with low urine volume 1.4 L and hypocitraturia States she has improved her water intake.    HPI: 81 y.o. female presents for 6 month follow-up.  Doing well since last visit No bothersome LUTS Denies dysuria, gross hematuria Denies flank, abdominal or pelvic pain   PMH: Past Medical History:  Diagnosis Date   Anxiety    Aortic atherosclerosis ([place])    Arthritis    Breast cancer (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   Current use of long term anticoagulation    Clopidogrel   Cystitis    Diabetes mellitus without complication ([facility])    NO MEDS-DIET CONTROLLED   GERD (gastroesophageal reflux disease)    Hemiparesis affecting left side as late effect of stroke ([facility]    Hernia 2011   History of hiatal hernia    History of kidney stones    Hyperlipidemia    Hypertension    Malignant neoplasm of upper-inner quadrant of female breast ([facility] 08/17/2011   Left, T2 (2.3 cm) N0, triple negative. Chemotherapy/post wide excision whole breast radiation.   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    Personal history of chemotherapy 2013   LEFT BREAST   Personal history of malignant neoplasm of breast    Personal history of radiation therapy 2017    Rt, 2013 had on left   [place]  09/06/2020   1 cm acute infarction in the right parietal deep and subcortical white matter   Vertigo     Surgical History: Past Surgical History:  Procedure Laterality Date   BREAST BIOPSY Left 07/18/2011   +   BREAST BIOPSY Left 09/21/2015   neg   BREAST BIOPSY Left 04/05/2016   neg   BREAST BIOPSY Left 03/20/2019   uKoreabx 2 areas /fat necrosis at 9:30 ribbon and 5:30 coil   [facility]  [facility]/30/2015   [facility] DC[facility]/CLEAR MARGINS, NEGATIVE LN   BREAST LUMPECTOMY Left 08/17/2011   BREAST LUMPECTOMY WITH SENTINEL LYMPH NODE BIOPSY Right 09/30/2015   Procedure: BREAST LUMPECTOMY WITH SENTINEL LYMPH NODE BX;  Surgeon: JRobert Bellow MD;  Location: [facility] ORS;  Service: General;  Laterality: Right;   BREAST SURGERY Left 2012   wide excision   BREAST SURGERY Left November 2013   Core biopsy of the upper-outer quadrant showed fat necrosis.   CHOLECYSTECTOMY  2007   COLONOSCOPY  2011   Dr OCandace Cruise  COLONOSCOPY WITH PROPOFOL N/A 02/25/2015   Procedure: COLONOSCOPY WITH PROPOFOL;  Surgeon: PHulen Luster MD;  [facility];  Service: Gastroenterology;  Laterality: N/A;   CYSTOSCOPY WITH STENT PLACEMENT Left 11/05/2020   Procedure: CYSTOSCOPY WITH STENT PLACEMENT;  Surgeon: SAbbie Sons MD;  Location: [facility] ORS;  Service:  Urology;  Laterality: Left;   CYSTOSCOPY/URETEROSCOPY/HOLMIUM LASER/STENT PLACEMENT Left 12/07/2020   Procedure: CYSTOSCOPY/URETEROSCOPY/HOLMIUM LASER/STENT PLACEMENT;  Surgeon: Abbie Sons, MD;  Location: [facility] ORS;  Service: Urology;  Laterality: Left;   DILATION AND CURETTAGE OF UTERUS  2004   HERNIA REPAIR Right 00/92/3300   Umbilical/ventral hernia repaired with 6.4 cm Proceed ventral patch   PORT-A-CATH REMOVAL     PORTACATH PLACEMENT  2013   RECURRENT HERNIA N/A 01/07/2008   Recurrent ventral hernia at the umbilicus, laparoscopy, open placement of a large Ultra Pro mesh  with trans-fascial sutures.   UPPER GI ENDOSCOPY  2011    Home Medications:  Allergies as of 07/17/2022   No Known Allergies      Medication List        Accurate as of July 17, 2022 11:06 AM. If you have any questions, ask your nurse or doctor.          Accu-Chek Aviva Plus test strip Generic drug: glucose blood CHECK  FASTING  BLOOD  GLUCOSE EVERY DAY   Accu-Chek Softclix Lancets lancets TEST BLOOD SUGAR EVERY DAY   acetaminophen 500 MG tablet Commonly known as: TYLENOL Take 500 mg by mouth every 6 (six) hours as needed for mild pain or headache.   ascorbic acid 500 MG tablet Commonly known as: VITAMIN C Take 500 mg by mouth daily.   atorvastatin 40 MG tablet Commonly known as: LIPITOR TAKE 1 TABLET EVERY EVENING   buPROPion 300 MG 24 hr tablet Commonly known as: WELLBUTRIN XL Take 1 tablet (300 mg total) by mouth daily.   Calcium Carb-Cholecalciferol 600-800 MG-UNIT Tabs Take 1 tablet by mouth daily.   cholecalciferol 1000 units tablet Commonly known as: VITAMIN D Take 1,000 Units by mouth daily.   clopidogrel 75 MG tablet Commonly known as: PLAVIX TAKE 1 TABLET EVERY DAY   cyanocobalamin 500 MCG tablet Commonly known as: VITAMIN B12 Take 500 mcg by mouth daily.   latanoprost 0.005 % ophthalmic solution Commonly known as: XALATAN Place 1 drop into both eyes at bedtime.   losartan 50 MG tablet Commonly known as: COZAAR TAKE 1 TABLET EVERY DAY   meclizine 25 MG tablet Commonly known as: ANTIVERT Take 0.5 tablets (12.5 mg total) by mouth 3 (three) times daily as needed for dizziness.   metFORMIN 1000 MG tablet Commonly known as: GLUCOPHAGE TAKE 1 TABLET TWICE DAILY WITH A MEAL   metoprolol tartrate 25 MG tablet Commonly known as: LOPRESSOR TAKE 1/2 TABLET TWICE DAILY   multivitamin with minerals Tabs tablet Take 1 tablet by mouth daily.   pantoprazole 40 MG tablet Commonly known as: PROTONIX Take 1 tablet (40 mg total) by mouth  daily.   pyridoxine 100 MG tablet Commonly known as: B-6 Take 100 mg by mouth daily.   sertraline 100 MG tablet Commonly known as: ZOLOFT TAKE 1/2 TABLET EVERY DAY   vitamin E 180 MG (400 UNITS) capsule Take 400 Units by mouth daily.        Allergies: No Known Allergies  Family History: Family History  Problem Relation Age of Onset   Lymphoma Father    Ovarian cancer Sister    Colon cancer Brother    Lymphoma Brother    Cancer Other        stomach   Cancer Other        stomach   Breast cancer Neg Hx     Social History:  reports that she has never smoked. She has never used smokeless tobacco. She  reports that she does not drink alcohol and does not use drugs.   Physical Exam: BP 133/80   Pulse 89   Ht _0  (1.676 m)   Wt 187 lb (84.8 kg)   BMI 30.18 kg/m   Constitutional:  Alert and oriented, No acute distress. HEENT: [place] AT Respiratory: Normal respiratory effort, no increased work of breathing. Psychiatric: Normal mood and affect.  Pertinent Imaging: Images of KUB performed today were personally reviewed and interpreted.  No calcifications identified suspicious for urinary tract stones   Assessment & Plan:    1.  History urinary calculi KUB today negative Continue hydration with optimal urine output of 2-2.5 L/day.   Follow-up 1 year with Baxter, MD  [facility] [address], [place], [place] [address] [phone]

## 2022-08-23 ENCOUNTER — Encounter: Payer: Self-pay | Admitting: Family Medicine

## 2022-08-23 ENCOUNTER — Ambulatory Visit (INDEPENDENT_AMBULATORY_CARE_PROVIDER_SITE_OTHER): Payer: Medicare HMO | Admitting: Family Medicine

## 2022-08-23 VITALS — BP 150/90 | HR 80 | Temp 98.6°F | Resp 16 | Wt 184.9 lb

## 2022-08-23 DIAGNOSIS — E118 Type 2 diabetes mellitus with unspecified complications: Secondary | ICD-10-CM | POA: Diagnosis not present

## 2022-08-23 DIAGNOSIS — Z636 Dependent relative needing care at home: Secondary | ICD-10-CM

## 2022-08-23 DIAGNOSIS — I10 Essential (primary) hypertension: Secondary | ICD-10-CM | POA: Diagnosis not present

## 2022-08-23 DIAGNOSIS — R4589 Other symptoms and signs involving emotional state: Secondary | ICD-10-CM

## 2022-08-23 NOTE — Progress Notes (Signed)
I,Leah Schaefer,acting as a scribe for [employer], MD.,have documented all relevant documentation on the behalf of Leah Foster, MD,as directed by  Leah Foster, MD while in the presence of Leah Foster, MD.   Established patient visit   Patient: Leah Schaefer   DOB: 1941-05-30   81 y.o. Female  MRN: [id number] Visit Date: 08/23/2022  Today's healthcare provider: [name], MD   Chief Complaint  Patient presents with   follow-up chronic disease   Subjective    HPI  Depressed Mood follow up  Caregiver Stress  Patient last seen for depressed mood after negative interaction with family in lobby  She was recommended for CCM referral Chart review shows referral was closed due to inability to get in touch with the patient   Patient's daughter Leah Schaefer, reports element of increased caregiver stress due to frustration with mother's willingness to actively participate in her care and needing to schedule apps and things for her mother around her work schedule  Patient expresses frustrations with not being able to drive and leave the home on her own schedule  They both agree that [name] would benefit from activities outside of the home  Patient states she used to enjoy going to the [employer] center and being involved in the activities       08/23/2022    3:48 PM  Depression screen PHQ 2/9  Decreased Interest 0  Down, Depressed, Hopeless 2  PHQ - 2 Score 2  Altered sleeping 1  Tired, decreased energy 0  Change in appetite 0  Feeling bad or failure about yourself  1  Trouble concentrating 0  Moving slowly or fidgety/restless 0  Suicidal thoughts 0  PHQ-9 Score 4  Difficult doing work/chores Not difficult at all    HTN: patient's BP elevated in office, she denies HA, vision changes, chest pain. Reports adherence to her medication regimen.   Diabetes Mellitus Type II, Follow-up  Lab Results   Component Value Date   HGBA1C 7.0 (H) 08/23/2022   HGBA1C 7.0 (A) 01/19/2022   HGBA1C 7.8 (H) 10/14/2021   Wt Readings from Last 3 Encounters:  08/23/22 184 lb 14.4 oz (83.9 kg)  07/17/22 187 lb (84.8 kg)  05/23/22 181 lb 8 oz (82.3 kg)   Last seen for diabetes 6 months ago.  Management since then includes continue current medication treatment. She reports excellent compliance with treatment.  Symptoms: No fatigue No foot ulcerations  No appetite changes No nausea  Yes paresthesia of the feet  No polydipsia  No polyuria No visual disturbances   No vomiting     Home blood sugar records:  130's -150's  Episodes of hypoglycemia? No    Current insulin regiment: none Most Recent Eye Exam: last month   Pertinent Labs: Lab Results  Component Value Date   CHOL 110 10/14/2021   HDL 33 (L) 10/14/2021   LDLCALC 51 10/14/2021   TRIG 151 (H) 10/14/2021   CHOLHDL 3.3 10/14/2021   Lab Results  Component Value Date   NA 139 08/23/2022   K 4.4 08/23/2022   CREATININE 0.89 08/23/2022   EGFR 65 08/23/2022   LABMICR 12.8 08/23/2022   MICRALBCREAT 32 (H) 08/23/2022        Medications: Outpatient Medications Prior to Visit  Medication Sig   ACCU-CHEK AVIVA PLUS test strip CHECK  FASTING  BLOOD  GLUCOSE EVERY DAY   Accu-Chek Softclix Lancets lancets TEST BLOOD SUGAR EVERY DAY   acetaminophen (TYLENOL)  500 MG tablet Take 500 mg by mouth every 6 (six) hours as needed for mild pain or headache.    atorvastatin (LIPITOR) 40 MG tablet TAKE 1 TABLET EVERY EVENING   buPROPion (WELLBUTRIN XL) 300 MG 24 hr tablet Take 1 tablet (300 mg total) by mouth daily.   Calcium Carb-Cholecalciferol 600-800 MG-UNIT TABS Take 1 tablet by mouth daily.   cholecalciferol (VITAMIN D) 1000 units tablet Take 1,000 Units by mouth daily.   clopidogrel (PLAVIX) 75 MG tablet TAKE 1 TABLET EVERY DAY   cyanocobalamin 500 MCG tablet Take 500 mcg by mouth daily.   latanoprost (XALATAN) 0.005 % ophthalmic  solution Place 1 drop into both eyes at bedtime.    losartan (COZAAR) 50 MG tablet TAKE 1 TABLET EVERY DAY   metFORMIN (GLUCOPHAGE) 1000 MG tablet TAKE 1 TABLET TWICE DAILY WITH A MEAL   metoprolol tartrate (LOPRESSOR) 25 MG tablet TAKE 1/2 TABLET TWICE DAILY   Multiple Vitamin (MULTIVITAMIN WITH MINERALS) TABS tablet Take 1 tablet by mouth daily.   pantoprazole (PROTONIX) 40 MG tablet Take 1 tablet (40 mg total) by mouth daily.   pyridoxine (B-6) 100 MG tablet Take 100 mg by mouth daily.   sertraline (ZOLOFT) 100 MG tablet TAKE 1/2 TABLET EVERY DAY   vitamin C (ASCORBIC ACID) 500 MG tablet Take 500 mg by mouth daily.   vitamin E 400 UNIT capsule Take 400 Units by mouth daily.   meclizine (ANTIVERT) 25 MG tablet Take 0.5 tablets (12.5 mg total) by mouth 3 (three) times daily as needed for dizziness. (Patient not taking: Reported on 08/23/2022)   No facility-administered medications prior to visit.    Review of Systems     Objective    BP (!) 150/90 (BP Location: Right Arm, Patient Position: Sitting, Cuff Size: Large)   Pulse 80   Temp 98.6 F (37 C) (Oral)   Resp 16   Wt 184 lb 14.4 oz (83.9 kg)   SpO2 98%   BMI 29.84 kg/m    Physical Exam Vitals reviewed.  Constitutional:      General: She is not in acute distress.    Appearance: Normal appearance. She is not ill-appearing, toxic-appearing or diaphoretic.  Eyes:     Conjunctiva/sclera: Conjunctivae normal.  Cardiovascular:     Rate and Rhythm: Normal rate and regular rhythm.     Pulses: Normal pulses.     Heart sounds: Normal heart sounds. No murmur heard.    No friction rub. No gallop.  Pulmonary:     Effort: Pulmonary effort is normal. No respiratory distress.     Breath sounds: Normal breath sounds. No stridor. No wheezing, rhonchi or rales.  Abdominal:     General: Bowel sounds are normal. There is no distension.     Palpations: Abdomen is soft.     Tenderness: There is no abdominal tenderness.  Musculoskeletal:      Right lower leg: No edema.     Left lower leg: No edema.  Skin:    Findings: No erythema or rash.  Neurological:     Mental Status: She is alert and oriented to person, place, and time.  Psychiatric:        Attention and Perception: Attention normal. She is attentive.        Mood and Affect: Mood and affect normal. Affect is not tearful.        Speech: Speech normal.        Behavior: Behavior normal. Behavior is not agitated, slowed, aggressive, withdrawn  or hyperactive. Behavior is cooperative.     Comments: Patient is quiet and waits patiently to speak after daughter provides statements       Results for orders placed or performed in visit on 08/23/22  Urine microalbumin-creatinine with uACR  Result Value Ref Range   Creatinine, Urine 40.6 Not Estab. mg/dL   Microalbumin, Urine 12.8 Not Estab. ug/mL   Microalb/Creat Ratio 32 (H) 0 - 29 mg/g creat  Comprehensive metabolic panel  Result Value Ref Range   Glucose 108 (H) 70 - 99 mg/dL   BUN 15 8 - 27 mg/dL   Creatinine, Ser 0.89 0.57 - 1.00 mg/dL   eGFR 65 >59 mL/min/1.73   BUN/Creatinine Ratio 17 12 - 28   Sodium 139 134 - 144 mmol/L   Potassium 4.4 3.5 - 5.2 mmol/L   Chloride 102 96 - 106 mmol/L   CO2 24 20 - 29 mmol/L   Calcium 9.8 8.7 - 10.3 mg/dL   Total Protein 7.1 6.0 - 8.5 g/dL   Albumin 4.7 3.8 - 4.8 g/dL   Globulin, Total 2.4 1.5 - 4.5 g/dL   Albumin/Globulin Ratio 2.0 1.2 - 2.2   Bilirubin Total 0.2 0.0 - 1.2 mg/dL   Alkaline Phosphatase 73 44 - 121 IU/L   AST 16 0 - 40 IU/L   ALT 12 0 - 32 IU/L  Hemoglobin A1c  Result Value Ref Range   Hgb A1c MFr Bld 7.0 (H) 4.8 - 5.6 %   Est. average glucose Bld gHb Est-mCnc 154 mg/dL    Assessment & Plan     Problem List Items Addressed This Visit       Cardiovascular and Mediastinum   Essential (primary) hypertension - Primary    BP elevated  Concern that may be related to relationship dynamics with daughter/caregiver and increased stress related to loss of  independence  Chronic care management replaced to help with psychosocial stressors CMP collected today Continue antihypertensive medications including losartan '50mg'$  daily and metoprolol 12.'5mg'$  twice daily       Relevant Orders   Comprehensive metabolic panel (Completed)   AMB Referral to Chronic Care Management Services     Endocrine   Type 2 diabetes mellitus with complication, without long-term current use of insulin (HCC)    A1c within goal range  DM well controlled  Continue current regimen: metformin '1000mg'$  twice daily  Repeat A1c and urine microalbumin collected today       Relevant Orders   Urine microalbumin-creatinine with uACR (Completed)   Hemoglobin A1c (Completed)   AMB Referral to Chronic Care Management Services     Other   Depressed affect    Improved from prior encounter  Suspect this may be related to caregiver dynamics and decreased independence and decreased social interaction with people outside of her family  Patient denies SI  CCM referral placed for resources       Caregiver stress    Elements of caregiver stress were discussed today as well as patient's challenges with decreased independence after her stroke  Recommended for CCM referral to which they were agreeable  Discussed recommendation to contact patient's daughter as patient has had several spam calls so they have silenced the ringer on the patient's phone  Leah Schaefer Daughter [phone]      Relevant Orders   AMB Referral to Chronic Care Management Services     Return in about 6 weeks (around 10/04/2022) for caregiver stress, HTN .       Portions of this  information were initially documented by Lyndel Pleasure, CMA and reviewed by me for thoroughness and accuracy.Leah Foster, MD   Leah Foster, MD  [facility] [phone] (phone) [phone] (fax)  Jay

## 2022-08-24 LAB — COMPREHENSIVE METABOLIC PANEL
ALT: 12 IU/L (ref 0–32)
AST: 16 IU/L (ref 0–40)
Albumin/Globulin Ratio: 2 (ref 1.2–2.2)
Albumin: 4.7 g/dL (ref 3.8–4.8)
Alkaline Phosphatase: 73 IU/L (ref 44–121)
BUN/Creatinine Ratio: 17 (ref 12–28)
BUN: 15 mg/dL (ref 8–27)
Bilirubin Total: 0.2 mg/dL (ref 0.0–1.2)
CO2: 24 mmol/L (ref 20–29)
Calcium: 9.8 mg/dL (ref 8.7–10.3)
Chloride: 102 mmol/L (ref 96–106)
Creatinine, Ser: 0.89 mg/dL (ref 0.57–1.00)
Globulin, Total: 2.4 g/dL (ref 1.5–4.5)
Glucose: 108 mg/dL — ABNORMAL HIGH (ref 70–99)
Potassium: 4.4 mmol/L (ref 3.5–5.2)
Sodium: 139 mmol/L (ref 134–144)
Total Protein: 7.1 g/dL (ref 6.0–8.5)
eGFR: 65 mL/min/{1.73_m2} (ref >59)

## 2022-08-24 LAB — HEMOGLOBIN A1C
Est. average glucose Bld gHb Est-mCnc: 154 mg/dL
Hgb A1c MFr Bld: 7 % — ABNORMAL HIGH (ref 4.8–5.6)

## 2022-08-24 LAB — MICROALBUMIN / CREATININE URINE RATIO
Creatinine, Urine: 40.6 mg/dL
Microalb/Creat Ratio: 32 mg/g creat — ABNORMAL HIGH (ref 0–29)
Microalbumin, Urine: 12.8 ug/mL

## 2022-08-25 NOTE — Assessment & Plan Note (Signed)
BP elevated  Concern that may be related to relationship dynamics with daughter/caregiver and increased stress related to loss of independence  Chronic care management replaced to help with psychosocial stressors CMP collected today Continue antihypertensive medications including losartan '50mg'$  daily and metoprolol 12.'5mg'$  twice daily

## 2022-08-25 NOTE — Assessment & Plan Note (Signed)
Elements of caregiver stress were discussed today as well as patient's challenges with decreased independence after her stroke  Recommended for CCM referral to which they were agreeable  Discussed recommendation to contact patient's daughter as patient has had several spam calls so they have silenced the ringer on the patient's phone  Quenten Raven Daughter [phone]

## 2022-08-25 NOTE — Assessment & Plan Note (Signed)
Improved from prior encounter  Suspect this may be related to caregiver dynamics and decreased independence and decreased social interaction with people outside of her family  Patient denies SI  CCM referral placed for resources

## 2022-08-25 NOTE — Assessment & Plan Note (Signed)
A1c within goal range  DM well controlled  Continue current regimen: metformin '1000mg'$  twice daily  Repeat A1c and urine microalbumin collected today

## 2022-09-01 ENCOUNTER — Telehealth: Payer: Self-pay

## 2022-09-01 NOTE — Progress Notes (Signed)
  Chronic Care Management   Note  09/01/2022 Name: SHUNA TABOR MRN: [id number] DOB: 11/20/1940  YANIRIS BRADDOCK is a 82 y.o. year old female who is a primary care patient of Simmons-Robinson, Riki Sheer, MD. I reached out to [name] by phone today in response to a referral sent by Ms. [place] PCP.  Ms. Otoole was given information about Chronic Care Management services today including:  CCM service includes personalized support from designated clinical staff supervised by the physician, including individualized plan of care and coordination with other care providers 24/7 contact phone numbers for assistance for urgent and routine care needs. Service will only be billed when office clinical staff spend 20 minutes or more in a month to coordinate care. Only one practitioner may furnish and bill the service in a calendar month. The patient may stop CCM services at amy time (effective at the end of the month) by phone call to the office staff. The patient will be responsible for cost sharing (co-pay) or up to 20% of the service fee (after annual deductible is met)  Ms. IZELLA YBANEZ  agreedto scheduling an appointment with the CCM RN Case Manager   Follow up plan: Patient agreed to scheduled appointment with RN Case Manager on 09/12/2022(date/time).   Noreene Larsson, [place], [place] [address] Direct Dial: [phone] [email or web address]

## 2022-09-04 ENCOUNTER — Telehealth: Payer: Self-pay | Admitting: *Deleted

## 2022-09-04 NOTE — Progress Notes (Signed)
  Care Coordination   Note   09/04/2022 Name: Leah Schaefer MRN: [id number] DOB: [birth year]  Leah Schaefer is a 82 y.o. year old female who sees Simmons-Robinson, Riki Sheer, MD for primary care. I reached out to [name] by phone today to offer care coordination services.  [name] was given information about Care Coordination services today including:   The Care Coordination services include support from the care team which includes your Nurse Coordinator, Clinical Social Worker, or Pharmacist.  The Care Coordination team is here to help remove barriers to the health concerns and goals most important to you. Care Coordination services are voluntary, and the patient may decline or stop services at any time by request to their care team member.   Care Coordination Consent Status: Patient agreed to services and verbal consent obtained.   Follow up plan:  Telephone appointment with care coordination team member scheduled for:  09/06/2022  Encounter Outcome:  Pt. Scheduled from referral   Julian Hy, [place] Direct Dial: [phone]

## 2022-09-06 ENCOUNTER — Encounter: Payer: Self-pay | Admitting: *Deleted

## 2022-09-07 ENCOUNTER — Telehealth: Payer: Self-pay | Admitting: *Deleted

## 2022-09-07 ENCOUNTER — Encounter: Payer: Self-pay | Admitting: *Deleted

## 2022-09-07 ENCOUNTER — Telehealth: Payer: Self-pay

## 2022-09-07 NOTE — Telephone Encounter (Signed)
Copied from [place] [id number]. Topic: General - Other >> Sep 07, 2022 12:49 PM Ja-Kwan M wrote: Reason for CRM: Pt requests that a list of her current medications be mailed to her home address so she can make [facility] aware of all her medications. Pt addressed confirmed. Cb# [phone]

## 2022-09-07 NOTE — Patient Outreach (Signed)
  Care Coordination   09/07/2022 Name: Leah Schaefer MRN: [id number] DOB: [birth year]   Care Coordination Outreach Attempts:  An unsuccessful telephone outreach was attempted today to offer the patient information about available care coordination services as a benefit of their health plan.   Follow Up Plan:  Additional outreach attempts will be made to offer the patient care coordination information and services.   Encounter Outcome:  No Answer -Mailbox was full could not leave a message  Care Coordination Interventions:  No, not indicated    [place], [place] Worker  [facility] Care Management [phone]

## 2022-09-07 NOTE — Telephone Encounter (Signed)
Done

## 2022-09-12 ENCOUNTER — Telehealth: Payer: Medicare HMO

## 2022-09-12 ENCOUNTER — Other Ambulatory Visit: Payer: Self-pay

## 2022-09-12 ENCOUNTER — Ambulatory Visit: Payer: Self-pay

## 2022-09-12 ENCOUNTER — Telehealth: Payer: Self-pay

## 2022-09-12 DIAGNOSIS — E119 Type 2 diabetes mellitus without complications: Secondary | ICD-10-CM

## 2022-09-12 MED ORDER — METFORMIN HCL 1000 MG PO TABS: ORAL_TABLET | ORAL | 1 refills | Status: DC

## 2022-09-12 NOTE — Telephone Encounter (Signed)
   CCM RN Visit Note  09/12/22 Name: ZIAH TURVEY MRN: [id number]      DOB: May 25, 1941  Subjective: YERALDY SPIKE is a 82 y.o. year old female who is a primary care patient of Simmons-Robinson, Makiera MD. The patient was referred to the Chronic Care Management team for assistance with care management needs subsequent to provider initiation of CCM services and plan of care.      Unable to reach patient on home line today. Call placed to secondary number/daughter Jonelle Sidle. Jonelle Sidle reports patient turned the ringers off d/t being overwhelmed with spam calls. She will go to her mom's residence later today and ensure the phones are turned on and make patient aware of pending call for CCM outreach.   PLAN: Will make additional outreach attempts.   Horris Latino RN Care Manager/Chronic Care Management [phone]

## 2022-09-12 NOTE — Telephone Encounter (Signed)
  Chief Complaint: Itchy rash around neck and shoulders Symptoms: Itchy rash Frequency: Today Pertinent Negatives: Patient denies Fever Disposition: '[]'$ ED /'[]'$ Urgent Care (no appt availability in office) / '[]'$ Appointment(In office/virtual)/ '[]'$  [facility]/ '[x]'$ Home Care/ '[]'$ Refused Recommended Disposition /'[]'$ [facility]/ '[]'$  Follow-up with PCP Additional Notes: PT developed an itchy rash overnight after using a new, but unwashed pillow case. Pt will use home care advice to treat. Pt will call back if needed.  Reason for Disposition  Mild localized rash  Answer Assessment - Initial Assessment Questions 1. APPEARANCE of RASH: "Describe the rash."      Little red bumps 2. LOCATION: "Where is the rash located?"      Neck and shoulders 3. NUMBER: "How many spots are there?"      Lots 4. SIZE: "How big are the spots?" (Inches, centimeters or compare to size of a coin)      Small bumps 5. ONSET: "When did the rash start?"      weekend 6. ITCHING: "Does the rash itch?" If Yes, ask: "How bad is the itch?"  (Scale 0-10; or none, mild, moderate, severe)     8-9/10 7. PAIN: "Does the rash hurt?" If Yes, ask: "How bad is the pain?"  (Scale 0-10; or none, mild, moderate, severe)    - NONE (0): no pain    - MILD (1-3): doesn't interfere with normal activities     - MODERATE (4-7): interferes with normal activities or awakens from sleep     - SEVERE (8-10): excruciating pain, unable to do any normal activities     none 8. OTHER SYMPTOMS: "Do you have any other symptoms?" (e.g., fever)     no 9. PREGNANCY: "Is there any chance you are pregnant?" "When was your last menstrual period?"  Protocols used: Rash or Redness - Localized-A-AH

## 2022-09-20 ENCOUNTER — Telehealth: Payer: Self-pay

## 2022-09-20 ENCOUNTER — Telehealth: Payer: Medicare HMO

## 2022-09-20 NOTE — Telephone Encounter (Signed)
   CCM RN Visit Note   09/20/22 Name: Leah Schaefer MRN: [id number]      DOB: 05-29-1941  Subjective: Leah Schaefer is a 82 y.o. year old female who is a primary care patient of . The patient was referred to the Chronic Care Management team for assistance with care management needs subsequent to provider initiation of CCM services and plan of care.      An unsuccessful outreach attempt was made today for a scheduled CCM visit.    PLAN: Phone rang multiple times without option to leave a voice message. The care management team will make additional outreach attempts.  Horris Latino RN Care Manager/Chronic Care Management [phone]

## 2022-09-21 ENCOUNTER — Other Ambulatory Visit: Payer: Self-pay | Admitting: Family Medicine

## 2022-09-21 DIAGNOSIS — R319 Hematuria, unspecified: Secondary | ICD-10-CM | POA: Diagnosis not present

## 2022-09-21 DIAGNOSIS — R8290 Unspecified abnormal findings in urine: Secondary | ICD-10-CM | POA: Diagnosis not present

## 2022-09-21 DIAGNOSIS — R350 Frequency of micturition: Secondary | ICD-10-CM | POA: Diagnosis not present

## 2022-09-21 DIAGNOSIS — N390 Urinary tract infection, site not specified: Secondary | ICD-10-CM | POA: Diagnosis not present

## 2022-09-21 MED ORDER — SERTRALINE HCL 100 MG PO TABS: 50.0000 mg | ORAL_TABLET | Freq: Every day | ORAL | 1 refills | Status: DC

## 2022-09-22 ENCOUNTER — Telehealth: Payer: Self-pay | Admitting: Family Medicine

## 2022-09-22 DIAGNOSIS — I10 Essential (primary) hypertension: Secondary | ICD-10-CM

## 2022-09-22 NOTE — Telephone Encounter (Signed)
[facility] faxed refill request for the following medications:  metoprolol tartrate (LOPRESSOR) 25 MG tablet    Please advise.

## 2022-09-23 ENCOUNTER — Other Ambulatory Visit: Payer: Self-pay

## 2022-09-23 ENCOUNTER — Emergency Department
Admission: EM | Admit: 2022-09-23 | Discharge: 2022-09-23 | Disposition: A | Discharge status: Home / Self Care | Payer: Medicare HMO | Attending: Emergency Medicine | Admitting: Emergency Medicine

## 2022-09-23 ENCOUNTER — Emergency Department: Payer: Medicare HMO

## 2022-09-23 DIAGNOSIS — Z853 Personal history of malignant neoplasm of breast: Secondary | ICD-10-CM | POA: Diagnosis not present

## 2022-09-23 DIAGNOSIS — R112 Nausea with vomiting, unspecified: Secondary | ICD-10-CM

## 2022-09-23 DIAGNOSIS — R109 Unspecified abdominal pain: Secondary | ICD-10-CM | POA: Diagnosis not present

## 2022-09-23 DIAGNOSIS — N12 Tubulo-interstitial nephritis, not specified as acute or chronic: Secondary | ICD-10-CM | POA: Insufficient documentation

## 2022-09-23 DIAGNOSIS — E119 Type 2 diabetes mellitus without complications: Secondary | ICD-10-CM | POA: Insufficient documentation

## 2022-09-23 DIAGNOSIS — I10 Essential (primary) hypertension: Secondary | ICD-10-CM | POA: Insufficient documentation

## 2022-09-23 DIAGNOSIS — Z20822 Contact with and (suspected) exposure to covid-19: Secondary | ICD-10-CM | POA: Diagnosis not present

## 2022-09-23 DIAGNOSIS — N10 Acute tubulo-interstitial nephritis: Secondary | ICD-10-CM | POA: Diagnosis not present

## 2022-09-23 DIAGNOSIS — I700 Atherosclerosis of aorta: Secondary | ICD-10-CM | POA: Diagnosis not present

## 2022-09-23 LAB — COMPREHENSIVE METABOLIC PANEL
ALT: 18 U/L (ref 0–44)
AST: 21 U/L (ref 15–41)
Albumin: 4.3 g/dL (ref 3.5–5.0)
Alkaline Phosphatase: 64 U/L (ref 38–126)
Anion gap: 11 (ref 5–15)
BUN: 18 mg/dL (ref 8–23)
CO2: 23 mmol/L (ref 22–32)
Calcium: 9.5 mg/dL (ref 8.9–10.3)
Chloride: 102 mmol/L (ref 98–111)
Creatinine, Ser: 1.22 mg/dL — ABNORMAL HIGH (ref 0.44–1.00)
GFR, Estimated: 45 mL/min — ABNORMAL LOW (ref >60)
Glucose, Bld: 128 mg/dL — ABNORMAL HIGH (ref 70–99)
Potassium: 4.1 mmol/L (ref 3.5–5.1)
Sodium: 136 mmol/L (ref 135–145)
Total Bilirubin: 1.1 mg/dL (ref 0.3–1.2)
Total Protein: 7.8 g/dL (ref 6.5–8.1)

## 2022-09-23 LAB — RESP PANEL BY RT-PCR (RSV, FLU A&B, COVID)  RVPGX2
Influenza A by PCR: NEGATIVE
Influenza B by PCR: NEGATIVE
Resp Syncytial Virus by PCR: NEGATIVE
SARS Coronavirus 2 by RT PCR: NEGATIVE

## 2022-09-23 LAB — CBC
HCT: 45.9 % (ref 36.0–46.0)
Hemoglobin: 15.3 g/dL — ABNORMAL HIGH (ref 12.0–15.0)
MCH: 29.9 pg (ref 26.0–34.0)
MCHC: 33.3 g/dL (ref 30.0–36.0)
MCV: 89.6 fL (ref 80.0–100.0)
Platelets: 269 10*3/uL (ref 150–400)
RBC: 5.12 MIL/uL — ABNORMAL HIGH (ref 3.87–5.11)
RDW: 12.5 % (ref 11.5–15.5)
WBC: 7.1 10*3/uL (ref 4.0–10.5)
nRBC: 0 % (ref 0.0–0.2)

## 2022-09-23 LAB — URINALYSIS, ROUTINE W REFLEX MICROSCOPIC
Bacteria, UA: NONE SEEN
Bilirubin Urine: NEGATIVE
Glucose, UA: NEGATIVE mg/dL
Hgb urine dipstick: NEGATIVE
Ketones, ur: 20 mg/dL — AB
Nitrite: NEGATIVE
Protein, ur: NEGATIVE mg/dL
Specific Gravity, Urine: >1.046 — ABNORMAL HIGH (ref 1.005–1.030)
pH: 5 (ref 5.0–8.0)

## 2022-09-23 LAB — LIPASE, BLOOD: Lipase: 47 U/L (ref 11–51)

## 2022-09-23 MED ORDER — ONDANSETRON 4 MG PO TBDP: 4.0000 mg | ORAL_TABLET | Freq: Three times a day (TID) | ORAL | 0 refills | Status: DC | PRN

## 2022-09-23 MED ORDER — LACTATED RINGERS IV BOLUS
1000.0000 mL | Freq: Once | INTRAVENOUS | Status: AC
  Administered 2022-09-23: 1000 mL via INTRAVENOUS

## 2022-09-23 MED ORDER — CEPHALEXIN 500 MG PO CAPS: 500.0000 mg | ORAL_CAPSULE | Freq: Four times a day (QID) | ORAL | 0 refills | Status: AC

## 2022-09-23 MED ORDER — SODIUM CHLORIDE 0.9 % IV SOLN
1.0000 g | Freq: Once | INTRAVENOUS | Status: AC
  Administered 2022-09-23: 1 g via INTRAVENOUS
  Filled 2022-09-23: qty 10

## 2022-09-23 MED ORDER — IOHEXOL 300 MG/ML  SOLN
75.0000 mL | Freq: Once | INTRAMUSCULAR | Status: AC | PRN
Start: 2022-09-23 — End: 2022-09-23
  Administered 2022-09-23: 75 mL via INTRAVENOUS

## 2022-09-23 MED ORDER — ONDANSETRON HCL 4 MG/2ML IJ SOLN
4.0000 mg | Freq: Once | INTRAMUSCULAR | Status: AC | PRN
  Administered 2022-09-23: 4 mg via INTRAVENOUS
  Filled 2022-09-23: qty 2

## 2022-09-23 NOTE — Discharge Instructions (Addendum)
Your CAT scan and blood work are reassuring.  It does look like you likely have a urinary tract infection.  Please stop taking the doxycycline and start taking the Keflex 4 times a day for 7 days.  You can take Zofran for nausea and vomiting.

## 2022-09-23 NOTE — ED Triage Notes (Signed)
Abd pain with n/v/d. Reports generalized abd pain with pain wrapping around her R side and flank. Reports symptoms ongoing x 1 wk. Hx of cholecystectomy, kidney stones, and hernia repair. Pt states "my stomach feels greasy." Reports seen at urgent care and given an abx and has taken 2 doses.

## 2022-09-23 NOTE — ED Notes (Signed)
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 States her doctors have told her to use her R arm for IV sticks when needed.

## 2022-09-23 NOTE — ED Provider Notes (Signed)
[facility] Provider Note    Event Date/Time   First MD Initiated Contact with Patient 09/23/22 Doran Heater     (approximate)   History   Abdominal Pain   HPI  Leah Schaefer is a 82 y.o. female past medical history of diabetes, breast cancer, throat who presents because of right flank pain nausea vomiting x 1 week.  Symptoms started about a week ago.  Patient distally had episodes of emesis that resolved for several days and then she has since had about 2-3 episodes of vomiting per day.  She is also had intermittent pain that radiates from the epigastric and right upper quadrant to the right flank.  This pain comes and goes.  Has not really eaten about a week.  She is also not really tolerating fluids.  Denies urinary symptoms.  Has had some intermittent urgency and frequency.  Went to urgent care 2 days ago until was told she had a UTI and was prescribed an antibiotic which she had 3 days ago.  She is continued to have emesis today.  Patient's had her gallbladder removed and has had hernia surgery.  She denies chest pain or dyspnea.  Patient was initially constipated earlier in the week but her daughter gave her stool softener and then she had loose stool.  Patient says stools look dark.  Denies any hematemesis.     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   Current use of long term anticoagulation    Clopidogrel   Cystitis    Diabetes mellitus without complication (HCC)    NO MEDS-DIET CONTROLLED   GERD (gastroesophageal reflux disease)    Hemiparesis affecting left side as late effect of stroke ([facility]    Hernia 2011   History of hiatal hernia     History of kidney stones    Hyperlipidemia    Hypertension    Malignant neoplasm of upper-inner quadrant of female breast ([facility] 08/17/2011   Left, T2 (2.3 cm) N0, triple negative. Chemotherapy/post wide excision whole breast radiation.   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    Personal history of chemotherapy 2013   LEFT BREAST   Personal history of malignant neoplasm of breast    Personal history of radiation therapy 2017    Rt, 2013 had on left   Stroke ([facility] 09/06/2020   1 cm acute infarction in the right parietal deep and subcortical white matter   Vertigo     Patient Active Problem List   Diagnosis Date Noted   Caregiver stress 08/23/2022   Need for influenza vaccination 05/23/2022   Depressed affect 05/23/2022   Annual physical exam 05/23/2022   Type 2 diabetes mellitus with complication, without long-term current use of insulin ([facility] 12/17/2020   Severe sepsis ([facility] [id number]  Complicated UTI (urinary tract infection) 11/05/2020   Hydronephrosis with renal and ureteral calculus obstruction 11/05/2020   History of breast cancer 11/05/2020   History of CVA (cerebrovascular accident) 11/05/2020   AKI (acute kidney injury) ([facility] 11/05/2020   Hemiparesis affecting left side as late effect of stroke ([facility]  11/05/2020   Postural dizziness with presyncope 11/05/2020   Stroke ([place]) 09/06/2020   Leukocytes in urine 06/09/2020   Grief 06/09/2020   Episode of recurrent major depressive disorder ([place]) 06/09/2020   Urinary symptom or sign 06/09/2020   Left sided numbness 11/06/2018   Fat necrosis of breast [id number]   Umbilical pain [id number]   Breast mass, left 04/07/2016   Allergic rhinitis 03/30/2015   Anxiety 03/30/2015   Carotid arterial disease ([place]) 03/30/2015   Diabetes mellitus, type 2 ([place]) 03/30/2015   Essential (primary) hypertension 03/30/2015   Hypercholesteremia 03/30/2015   Left leg pain 03/30/2015   LBP (low back pain)  03/30/2015   Neuropathic pain 03/30/2015   Burning or prickling sensation 03/30/2015   Neuralgia neuritis, sciatic nerve 03/30/2015   Skin cyst 11/24/2014   Carcinoma of upper-outer quadrant of right breast in female, estrogen receptor positive ([place]) 08/17/2011   Allergic reaction 11/29/2009   Adaptation reaction 04/30/2009   Acid reflux 04/30/2009   Stricture and stenosis of cervix 01/28/2009     Physical Exam  Triage Vital Signs: ED Triage Vitals  Enc Vitals Group     BP 09/23/22 1908 131/87     Pulse Rate 09/23/22 1908 86     Resp 09/23/22 1908 18     Temp 09/23/22 1908 97.9 F (36.6 C)     Temp Source 09/23/22 1908 Oral     SpO2 09/23/22 1908 97 %     Weight 09/23/22 1907 178 lb (80.7 kg)     Height 09/23/22 1907 5' 6"$  (1.676 m)     Head Circumference --      Peak Flow --      Pain Score 09/23/22 1910 7     Pain Loc --      Pain Edu? --      Excl. in [place]? --     Most recent vital signs: Vitals:   09/23/22 2130 09/23/22 2200  BP: 119/60 133/76  Pulse: 77 79  Resp: 19 19  Temp:    SpO2: 100% 100%     General: Awake, no distress.  CV:  Good peripheral perfusion.  Resp:  Normal effort.  Abd:  No distention.  Abdomen is soft, there is mild tenderness in the right mid abdomen/suprapubic region without guarding Neuro:             Awake, Alert, Oriented x 3  Other:  R CVA TTP, Brown stool on rectal exam  ED Results / Procedures / Treatments  Labs (all labs ordered are listed, but only abnormal results are displayed) Labs Reviewed  COMPREHENSIVE METABOLIC PANEL - Abnormal; Notable for the following components:      Result Value   Glucose, Bld 128 (*)    Creatinine, Ser 1.22 (*)    GFR, Estimated 45 (*)    All other components within normal limits  CBC - Abnormal; Notable for the following components:   RBC 5.12 (*)    Hemoglobin 15.3 (*)    All other components within normal limits  URINALYSIS, ROUTINE W REFLEX MICROSCOPIC - Abnormal; Notable for the  following components:   Color, Urine STRAW (*)    APPearance HAZY (*)    Specific Gravity, Urine >1.046 (*)    Ketones, ur 20 (*)    Leukocytes,Ua LARGE (*)    All other components within normal limits  RESP PANEL BY RT-PCR (RSV, FLU A&B, COVID)  [id number]  URINE CULTURE  LIPASE, BLOOD     EKG  RADIOLOGY I reviewed and interpreted the CT of the abdomen pelvis that is negative for acute abnormality   PROCEDURES:  Critical Care performed: No  Procedures   MEDICATIONS ORDERED IN ED: Medications  cefTRIAXone (ROCEPHIN) 1 g in sodium chloride 0.9 % 100 mL IVPB (has no administration in time range)  ondansetron (ZOFRAN) injection 4 mg (4 mg Intravenous Given 09/23/22 1932)  lactated ringers bolus 1,000 mL (0 mLs Intravenous Stopped 09/23/22 2221)  iohexol (OMNIPAQUE) 300 MG/ML solution 75 mL (75 mLs Intravenous Contrast Given 09/23/22 2026)     IMPRESSION / MDM / ASSESSMENT AND PLAN / ED COURSE  I reviewed the triage vital signs and the nursing notes.                              Patient's presentation is most consistent with acute complicated illness / injury requiring diagnostic workup.  Differential diagnosis includes, but is not limited to, gastroenteritis, peptic ulcer, pyelonephritis, nephrolithiasis, choledocholithiasis, pancreatitis  The patient is a 82 year old female presents with about a week of intermittent abdominal/flank pain as well as nausea and vomiting and inability to tolerate p.o.  Symptoms have been coming and going initially started out with nausea and vomiting or that started Sunday seem to resolve for 2 days and has since had daily vomiting about 2-3 episodes.  Has been barely taking any p.o.  She also has pain in the epigastric region that radiates to the right flank this also comes and goes does not seem to have any clear exacerbating or alleviating factors.  Patient has had some intermittent urinary symptoms.  No fevers no chest pain or dyspnea.  Went  to urgent care and was diagnosed with a UTI 2 days ago and given antibiotic.  Patient is denying any pain currently.  On exam overall she looks well she does have some CVA tenderness as well as tenderness in the right mid abdomen and suprapubic region but no guarding.  Vitals are reassuring.  Plan to obtain CBC CMP lipase urinalysis and CT of the abdomen pelvis.  Patient not in pain currently so does not require meds.  Will give a bolus of fluid.  Labs are overall reassuring.  Creatinine mildly bumped from 0.9-1.22.  Patient was receiving fluids.  CBC CMP and lipase are reassuring.  CT of the abdomen pelvis have any obvious concerning findings.  Patient has not had any significant pain or vomiting here.  Will p.o. challenge.  Still needs to provide urine sample.  UA does have 21-50 white cells with large leuks which is more pyuria than prior urinalysis several days ago.  I have added on urine culture.  I see that patient was prescribed doxycycline 3 days ago for UTI.  This would not have been my first choice.  Will give a dose of Rocephin in the ED and discharged with Keflex.  Patient is not having any ongoing pain she is not having vomiting in the ED she is not clinically septic I think that she can still be treated with outpatient antibiotics.  Will prescribe Zofran as well.     FINAL CLINICAL IMPRESSION(S) / ED DIAGNOSES   Final diagnoses:  Nausea and vomiting, unspecified vomiting type  Pyelonephritis     Rx / DC Orders   ED Discharge Orders          Ordered    cephALEXin (KEFLEX) 500 MG capsule  4 times daily  09/23/22 2239    ondansetron (ZOFRAN-ODT) 4 MG disintegrating tablet  Every 8 hours PRN        09/23/22 2239             Note:  This document was prepared using Dragon voice recognition software and may include unintentional dictation errors.   Rada Hay, MD 09/23/22 2239

## 2022-09-25 LAB — URINE CULTURE

## 2022-09-25 MED ORDER — METOPROLOL TARTRATE 25 MG PO TABS: 12.5000 mg | ORAL_TABLET | Freq: Two times a day (BID) | ORAL | 0 refills | Status: DC

## 2022-09-26 ENCOUNTER — Telehealth: Payer: Self-pay | Admitting: *Deleted

## 2022-09-26 NOTE — Transitions of Care (Post Inpatient/ED Visit) (Signed)
   09/26/2022  Name: Leah Schaefer MRN: [id number] DOB: 11-Mar-1941  Today's TOC FU Call Status: Today's TOC FU Call Status:: Successful TOC FU Call Competed TOC FU Call Complete Date: 09/26/22  Transition Care Management Follow-up Telephone Call Date of Discharge: 09/24/22 Discharge Facility: [facility]) Type of Discharge: Emergency Department Reason for ED Visit: Other: (nausea and vomitng, UTI) How have you been since you were released from the hospital?: Better  Items Reviewed: Did you receive and understand the discharge instructions provided?: Yes Medications obtained and verified?: Yes (Medications Reviewed) Any new allergies since your discharge?: No Dietary orders reviewed?: No Do you have support at home?: Yes People in Home: child(ren), adult Name of Support/Comfort Primary Source: [place] crews daughter  [place] and Equipment/Supplies: [place] Ordered?: No Any new equipment or medical supplies ordered?: No  Functional Questionnaire: Do you need assistance with bathing/showering or dressing?: No Do you need assistance with meal preparation?: No Do you need assistance with eating?: No Do you have difficulty maintaining continence: No Do you need assistance with getting out of bed/getting out of a chair/moving?: No Do you have difficulty managing or taking your medications?: No  Folllow up appointments reviewed: PCP Follow-up appointment confirmed?: Yes Date of PCP follow-up appointment?: 10/03/22 Follow-up Provider: Dr Stark Klein- [facility] 3:20 [facility] Follow-up appointment confirmed?: NA Do you need transportation to your follow-up appointment?: No Do you understand care options if your condition(s) worsen?: Yes-patient verbalized understanding  SDOH Interventions Today    Flowsheet Row Most Recent Value  SDOH Interventions   Food Insecurity Interventions Intervention Not Indicated  Housing  Interventions Intervention Not Indicated  Transportation Interventions Intervention Not Indicated      Interventions Today    Flowsheet Row Most Recent Value  Pharmacy Interventions   Pharmacy Dicussed/Reviewed Pharmacy Topics Discussed  [dicussed making sure she takes all the atbx]       [facility] [phone]

## 2022-10-03 ENCOUNTER — Encounter: Payer: Self-pay | Admitting: Family Medicine

## 2022-10-03 ENCOUNTER — Ambulatory Visit (INDEPENDENT_AMBULATORY_CARE_PROVIDER_SITE_OTHER): Payer: Medicare HMO | Admitting: Family Medicine

## 2022-10-03 VITALS — BP 135/84 | HR 77 | Resp 16 | Wt 179.0 lb

## 2022-10-03 DIAGNOSIS — R0602 Shortness of breath: Secondary | ICD-10-CM | POA: Diagnosis not present

## 2022-10-03 DIAGNOSIS — I10 Essential (primary) hypertension: Secondary | ICD-10-CM

## 2022-10-03 MED ORDER — FLUTICASONE PROPIONATE 50 MCG/ACT NA SUSP: 2.0000 | Freq: Every day | NASAL | 6 refills | Status: DC

## 2022-10-03 NOTE — Progress Notes (Signed)
Established patient visit   Patient: Leah Schaefer   DOB: 06/03/1941   82 y.o. Female  MRN: [id number] Visit Date: 10/03/2022  Today's healthcare provider: [name], MD   Chief Complaint  Patient presents with   Follow-up   Subjective    HPI  Patient here for 6 weeks follow-up for caregiver stress and HTN.  Hypertension, follow-up  BP Readings from Last 3 Encounters:  10/03/22 135/84  09/23/22 128/75  08/23/22 (!) 150/90   Wt Readings from Last 3 Encounters:  10/03/22 179 lb (81.2 kg)  09/23/22 178 lb (80.7 kg)  08/23/22 184 lb 14.4 oz (83.9 kg)     She was last seen for hypertension 6 weeks ago.  BP at that visit was 150/90. Management since that visit includes Continue antihypertensive medications including losartan '50mg'$  daily and metoprolol 12.'5mg'$  twice daily  .  She reports excellent compliance with treatment.   Outside blood pressures are 142/80's.  Patient reports feeling SOB with walking for short distance. This is new. It started a week ago.   She also reports that she went to the ED on Friday evening and then went again on Saturday for the SOB and Friday because of her sugar.   SOB on Exertion  She reports feeling SOB with walking, she reports SOB happens after walking about half a mile  She denies feeling lightheaded or dizzy  She denies chest pain associated with the shortness of breath  It feels better when she sits down to rest  Patient states she was seen in the Ed this past week and wekeend but I am unable to review these reocrds of these encounters     Medications: Outpatient Medications Prior to Visit  Medication Sig   ACCU-CHEK AVIVA PLUS test strip CHECK  FASTING  BLOOD  GLUCOSE EVERY DAY   Accu-Chek Softclix Lancets lancets TEST BLOOD SUGAR EVERY DAY   acetaminophen (TYLENOL) 500 MG tablet Take 500 mg by mouth every 6 (six) hours as needed for mild pain or headache.    atorvastatin (LIPITOR) 40 MG tablet TAKE 1  TABLET EVERY EVENING   buPROPion (WELLBUTRIN XL) 300 MG 24 hr tablet Take 1 tablet (300 mg total) by mouth daily.   Calcium Carb-Cholecalciferol 600-800 MG-UNIT TABS Take 1 tablet by mouth daily.   cholecalciferol (VITAMIN D) 1000 units tablet Take 1,000 Units by mouth daily.   clopidogrel (PLAVIX) 75 MG tablet TAKE 1 TABLET EVERY DAY   cyanocobalamin 500 MCG tablet Take 500 mcg by mouth daily.   latanoprost (XALATAN) 0.005 % ophthalmic solution Place 1 drop into both eyes at bedtime.    losartan (COZAAR) 50 MG tablet TAKE 1 TABLET EVERY DAY   meclizine (ANTIVERT) 25 MG tablet Take 0.5 tablets (12.5 mg total) by mouth 3 (three) times daily as needed for dizziness.   metFORMIN (GLUCOPHAGE) 1000 MG tablet TAKE 1 TABLET TWICE DAILY WITH A MEAL   metoprolol tartrate (LOPRESSOR) 25 MG tablet Take 0.5 tablets (12.5 mg total) by mouth 2 (two) times daily.   Multiple Vitamin (MULTIVITAMIN WITH MINERALS) TABS tablet Take 1 tablet by mouth daily.   ondansetron (ZOFRAN-ODT) 4 MG disintegrating tablet Take 1 tablet (4 mg total) by mouth every 8 (eight) hours as needed.   pantoprazole (PROTONIX) 40 MG tablet Take 1 tablet (40 mg total) by mouth daily.   pyridoxine (B-6) 100 MG tablet Take 100 mg by mouth daily.   sertraline (ZOLOFT) 100 MG tablet Take 0.5 tablets (50 mg  total) by mouth daily.   vitamin C (ASCORBIC ACID) 500 MG tablet Take 500 mg by mouth daily.   vitamin E 400 UNIT capsule Take 400 Units by mouth daily.   No facility-administered medications prior to visit.    Review of Systems  Eyes:  Negative for visual disturbance.  Respiratory:  Positive for shortness of breath. Negative for chest tightness.   Cardiovascular:  Negative for chest pain and leg swelling.       Objective    BP 135/84 (BP Location: Right Arm, Patient Position: Sitting, Cuff Size: Normal)   Pulse 77   Resp 16   Wt 179 lb (81.2 kg)   BMI 28.89 kg/m    Physical Exam Vitals reviewed.  Constitutional:       General: She is not in acute distress.    Appearance: Normal appearance. She is not ill-appearing, toxic-appearing or diaphoretic.  Eyes:     Conjunctiva/sclera: Conjunctivae normal.  Cardiovascular:     Rate and Rhythm: Normal rate and regular rhythm.     Pulses: Normal pulses.     Heart sounds: Normal heart sounds. No murmur heard.    No friction rub. No gallop.  Pulmonary:     Effort: Pulmonary effort is normal. No respiratory distress.     Breath sounds: Normal breath sounds. No stridor. No wheezing, rhonchi or rales.  Abdominal:     General: Bowel sounds are normal. There is no distension.     Palpations: Abdomen is soft.     Tenderness: There is no abdominal tenderness.  Musculoskeletal:     Right lower leg: No edema.     Left lower leg: No edema.  Skin:    Findings: No erythema or rash.  Neurological:     Mental Status: She is alert and oriented to person, place, and time.      No results found for any visits on 10/03/22.  Assessment & Plan     Problem List Items Addressed This Visit       Cardiovascular and Mediastinum   Essential (primary) hypertension    Controlled BP at goal Continue losartan '50mg'$  and metoprolol '25mg'$   No medications changes today         Other   SOB (shortness of breath) - Primary    Acute issue  Patient does not appear to be in distress and has normal lung exam today  Vitals signs within normal limits, will need pulse ox measurement at follow up  Will order CXR today to evaluate for cardiomegaly or lung field abnormality  Patient to follow up in 3 weeks       Relevant Orders   DG Chest 2 View     Return in about 3 weeks (around 10/24/2022) for SOB f/u .        The entirety of the information documented in the History of Present Illness, Review of Systems and Physical Exam were personally obtained by me. Portions of this information were initially documented by Lyndel Pleasure, CMA . I, [name], MD have reviewed  the documentation above for thoroughness and accuracy.      [name], MD  [facility] [phone] (phone) [phone] (fax)  [place]

## 2022-10-03 NOTE — Patient Instructions (Addendum)
Please go for an xray to evaluate for any changes in the chest that may be related to your symptoms.   Please report to [facility] located at:  [place], [place]  You do not need an appointment to have xrays completed.   Our office will follow up with  results once available.  I have sent a prescription for Flonase nasal spray to help with your symptoms. Please spray two sprays per nostril daily.   Please call back to schedule an appointment with me in 3 weeks to follow up on the shortness of breath symptoms

## 2022-10-03 NOTE — Assessment & Plan Note (Signed)
Controlled BP at goal Continue losartan '50mg'$  and metoprolol '25mg'$   No medications changes today

## 2022-10-03 NOTE — Assessment & Plan Note (Signed)
Acute issue  Patient does not appear to be in distress and has normal lung exam today  Vitals signs within normal limits, will need pulse ox measurement at follow up  Will order CXR today to evaluate for cardiomegaly or lung field abnormality  Patient to follow up in 3 weeks

## 2022-10-04 ENCOUNTER — Telehealth: Payer: Self-pay

## 2022-10-04 NOTE — Progress Notes (Signed)
  Chronic Care Management Note  10/04/2022 Name: KYNSLEY TARVIN MRN: [id number] DOB: 08-29-1940  ARLIENE TOMEK is a 82 y.o. year old female who is a primary care patient of Simmons-Robinson, Riki Sheer, MD and is actively engaged with the Chronic Care Management team. I reached out to [name] by phone today to assist with re-scheduling an initial visit with the RN Case Manager  Follow up plan: Unsuccessful telephone outreach attempt made. A HIPAA compliant phone message was left for the patient providing contact information and requesting a return call.  The care management team will reach out to the patient again over the next 7 days.  If patient returns call to provider office, please advise to call Annetta  at [place], [place], [place] [address] Direct Dial: [phone] [email or web address]

## 2022-10-12 NOTE — Progress Notes (Signed)
I,Leah Schaefer,acting as a scribe for [employer], MD.,have documented all relevant documentation on the behalf of Leah Foster, MD,as directed by  Leah Foster, MD while in the presence of Leah Foster, MD.   Established patient visit   Patient: Leah Schaefer   DOB: November 24, 1940   82 y.o. Female  MRN: [id number] Visit Date: 10/13/2022  Today's healthcare provider: [name], MD   Chief Complaint  Patient presents with  . Auditory concerns   Subjective    HPI  Patient's daughter reports that for the last 3 days patient has been hearing water running that is not running. Daughter requesting a hearing test to be done. Hx of vertigo. They staets     Patient's daughter is concerned that [name] is not taking her medications. She reports that she was having more bowel movements so has not been taking her medications.  Medications: Outpatient Medications Prior to Visit  Medication Sig  . ACCU-CHEK AVIVA PLUS test strip CHECK  FASTING  BLOOD  GLUCOSE EVERY DAY  . Accu-Chek Softclix Lancets lancets TEST BLOOD SUGAR EVERY DAY  . acetaminophen (TYLENOL) 500 MG tablet Take 500 mg by mouth every 6 (six) hours as needed for mild pain or headache.   Marland Kitchen atorvastatin (LIPITOR) 40 MG tablet TAKE 1 TABLET EVERY EVENING  . buPROPion (WELLBUTRIN XL) 300 MG 24 hr tablet Take 1 tablet (300 mg total) by mouth daily.  . Calcium Carb-Cholecalciferol 600-800 MG-UNIT TABS Take 1 tablet by mouth daily.  . cholecalciferol (VITAMIN D) 1000 units tablet Take 1,000 Units by mouth daily.  . clopidogrel (PLAVIX) 75 MG tablet TAKE 1 TABLET EVERY DAY  . cyanocobalamin 500 MCG tablet Take 500 mcg by mouth daily.  . fluticasone (FLONASE) 50 MCG/ACT nasal spray Place 2 sprays into both nostrils daily.  Marland Kitchen latanoprost (XALATAN) 0.005 % ophthalmic solution Place 1 drop into both eyes at bedtime.   Marland Kitchen losartan (COZAAR) 50 MG tablet TAKE 1 TABLET  EVERY DAY  . meclizine (ANTIVERT) 25 MG tablet Take 0.5 tablets (12.5 mg total) by mouth 3 (three) times daily as needed for dizziness.  . metFORMIN (GLUCOPHAGE) 1000 MG tablet TAKE 1 TABLET TWICE DAILY WITH A MEAL  . metoprolol tartrate (LOPRESSOR) 25 MG tablet Take 0.5 tablets (12.5 mg total) by mouth 2 (two) times daily.  . Multiple Vitamin (MULTIVITAMIN WITH MINERALS) TABS tablet Take 1 tablet by mouth daily.  . ondansetron (ZOFRAN-ODT) 4 MG disintegrating tablet Take 1 tablet (4 mg total) by mouth every 8 (eight) hours as needed.  . pantoprazole (PROTONIX) 40 MG tablet Take 1 tablet (40 mg total) by mouth daily.  Marland Kitchen pyridoxine (B-6) 100 MG tablet Take 100 mg by mouth daily.  . sertraline (ZOLOFT) 100 MG tablet Take 0.5 tablets (50 mg total) by mouth daily.  . vitamin C (ASCORBIC ACID) 500 MG tablet Take 500 mg by mouth daily.  . vitamin E 400 UNIT capsule Take 400 Units by mouth daily.   No facility-administered medications prior to visit.    Review of Systems  {Labs  Heme  Chem  Endocrine  Serology  Results Review (optional):23779}   Objective    BP (!) 147/86 (BP Location: Right Arm, Patient Position: Sitting, Cuff Size: Normal)   Pulse 81   Resp 16   Wt 174 lb (78.9 kg)   BMI 28.08 kg/m  {Show previous vital signs (optional):23777}  Physical Exam  ***  No results found for any visits on 10/13/22.  Assessment &  Plan     Problem List Items Addressed This Visit   None    No follow-ups on file.        The entirety of the information documented in the History of Present Illness, Review of Systems and Physical Exam were personally obtained by me. Portions of this information were initially documented by Leah Schaefer, CMA . I, Leah Schaefer, CMA have reviewed the documentation above for thoroughness and accuracy.      Leah Foster, MD  [facility] [phone] (phone) [phone] (fax)  [place]

## 2022-10-13 ENCOUNTER — Ambulatory Visit (INDEPENDENT_AMBULATORY_CARE_PROVIDER_SITE_OTHER): Payer: Medicare HMO | Admitting: Family Medicine

## 2022-10-13 ENCOUNTER — Encounter: Payer: Self-pay | Admitting: Family Medicine

## 2022-10-13 VITALS — BP 147/86 | HR 81 | Resp 16 | Wt 174.0 lb

## 2022-10-13 DIAGNOSIS — I10 Essential (primary) hypertension: Secondary | ICD-10-CM | POA: Diagnosis not present

## 2022-10-13 DIAGNOSIS — H6992 Unspecified Eustachian tube disorder, left ear: Secondary | ICD-10-CM

## 2022-10-13 DIAGNOSIS — H9192 Unspecified hearing loss, left ear: Secondary | ICD-10-CM | POA: Diagnosis not present

## 2022-10-13 DIAGNOSIS — E119 Type 2 diabetes mellitus without complications: Secondary | ICD-10-CM | POA: Diagnosis not present

## 2022-10-13 MED ORDER — FLUTICASONE PROPIONATE 50 MCG/ACT NA SUSP: 2.0000 | Freq: Every day | NASAL | 6 refills | Status: DC

## 2022-10-13 MED ORDER — METFORMIN HCL 1000 MG PO TABS: ORAL_TABLET | ORAL | 1 refills | Status: DC

## 2022-10-13 NOTE — Patient Instructions (Signed)
Please start metformin '1000mg'$  once daily   Please start using the flonase once daily, two sprays like how we discussed aiming outwards with both sprays.    Please use Debrox drops in the left ear to help with the wax removal.    Please follow up in 2 months for left ear and diabetes follow up

## 2022-10-15 NOTE — Assessment & Plan Note (Signed)
Chronic  Worsened  Referral submitted to ENT, advised patient to call office if no word regarding appt in 2 weeks  Recommended flonase nasal spray daily, demonstrated technique for spraying to both the patient and her daughter

## 2022-10-15 NOTE — Assessment & Plan Note (Signed)
Chronic  Stable  Recommended that she decreased to metformin '1000mg'$  daily instead of twice daily due to increased loose stools

## 2022-10-15 NOTE — Assessment & Plan Note (Signed)
Chronic  BP elevated, not at goal  Patient has not taken BP medication today  Recommended that she takes this prior to appointments in the future  Will not make any changes today to her medication regimen  Recommended she continue losartan '50mg'$  daily and metoprolol 12.'5mg'$  BID

## 2022-10-19 ENCOUNTER — Telehealth: Payer: Medicare HMO

## 2022-10-27 ENCOUNTER — Telehealth: Payer: Medicare HMO

## 2022-10-30 ENCOUNTER — Ambulatory Visit (INDEPENDENT_AMBULATORY_CARE_PROVIDER_SITE_OTHER): Payer: Medicare HMO

## 2022-10-30 ENCOUNTER — Telehealth: Payer: Medicare HMO

## 2022-10-30 ENCOUNTER — Telehealth: Payer: Self-pay

## 2022-10-30 VITALS — Ht 66.0 in | Wt 171.0 lb

## 2022-10-30 DIAGNOSIS — H9193 Unspecified hearing loss, bilateral: Secondary | ICD-10-CM

## 2022-10-30 DIAGNOSIS — Z0000 Encounter for general adult medical examination without abnormal findings: Secondary | ICD-10-CM

## 2022-10-30 NOTE — Progress Notes (Signed)
I connected with  Leah Schaefer on 10/30/22 by a audio enabled telemedicine application and verified that I am speaking with the correct person using two identifiers.  Patient Location: Home  Provider Location: Office/Clinic  I discussed the limitations of evaluation and management by telemedicine. The patient expressed understanding and agreed to proceed.  Subjective:   Leah Schaefer is a 82 y.o. female who presents for Medicare Annual (Subsequent) preventive examination.  Review of Systems    Cardiac Risk Factors include: advanced age (>68men, >93 women);diabetes mellitus;dyslipidemia;hypertension;sedentary lifestyle    Objective:    Today's Vitals   10/30/22 1514  Weight: 171 lb (77.6 kg)  Height: 5\' 6"  (1.676 m)   Body mass index is 27.6 kg/m.     10/30/2022    3:28 PM 09/23/2022    7:11 PM 10/25/2021    2:40 PM 08/05/2021    1:54 PM 01/19/2021    9:31 AM 12/07/2020    7:07 AM 11/29/2020    2:48 PM  Advanced Directives  Does Patient Have a Medical Advance Directive? No No No No  Yes Yes  Type of Teacher, early years/pre;Living will [place]  Does patient want to make changes to medical advance directive?      No - Patient declined   Copy of [place] in Chart?      No - copy requested   Would patient like information on creating a medical advance directive?   No - Patient declined No - Patient declined No - Patient declined      Current Medications (verified) Outpatient Encounter Medications as of 10/30/2022  Medication Sig   ACCU-CHEK AVIVA PLUS test strip CHECK  FASTING  BLOOD  GLUCOSE EVERY DAY   Accu-Chek Softclix Lancets lancets TEST BLOOD SUGAR EVERY DAY   acetaminophen (TYLENOL) 500 MG tablet Take 500 mg by mouth every 6 (six) hours as needed for mild pain or headache.    atorvastatin (LIPITOR) 40 MG tablet TAKE 1 TABLET EVERY EVENING   buPROPion (WELLBUTRIN XL) 300 MG 24 hr tablet Take 1  tablet (300 mg total) by mouth daily.   Calcium Carb-Cholecalciferol 600-800 MG-UNIT TABS Take 1 tablet by mouth daily.   cholecalciferol (VITAMIN D) 1000 units tablet Take 1,000 Units by mouth daily.   clopidogrel (PLAVIX) 75 MG tablet TAKE 1 TABLET EVERY DAY   cyanocobalamin 500 MCG tablet Take 500 mcg by mouth daily.   fluticasone (FLONASE) 50 MCG/ACT nasal spray Place 2 sprays into both nostrils daily.   latanoprost (XALATAN) 0.005 % ophthalmic solution Place 1 drop into both eyes at bedtime.    losartan (COZAAR) 50 MG tablet TAKE 1 TABLET EVERY DAY   meclizine (ANTIVERT) 25 MG tablet Take 0.5 tablets (12.5 mg total) by mouth 3 (three) times daily as needed for dizziness.   metFORMIN (GLUCOPHAGE) 1000 MG tablet TAKE 1 TABLET ONCE DAILY WITH A MEAL   metoprolol tartrate (LOPRESSOR) 25 MG tablet Take 0.5 tablets (12.5 mg total) by mouth 2 (two) times daily.   Multiple Vitamin (MULTIVITAMIN WITH MINERALS) TABS tablet Take 1 tablet by mouth daily.   ondansetron (ZOFRAN-ODT) 4 MG disintegrating tablet Take 1 tablet (4 mg total) by mouth every 8 (eight) hours as needed.   pantoprazole (PROTONIX) 40 MG tablet Take 1 tablet (40 mg total) by mouth daily.   pyridoxine (B-6) 100 MG tablet Take 100 mg by mouth daily.   sertraline (ZOLOFT) 100 MG tablet  Take 0.5 tablets (50 mg total) by mouth daily.   vitamin C (ASCORBIC ACID) 500 MG tablet Take 500 mg by mouth daily.   vitamin E 400 UNIT capsule Take 400 Units by mouth daily.   No facility-administered encounter medications on file as of 10/30/2022.    Allergies (verified) Patient has no known allergies.   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 Chemotherapy/post wide excision whole breast radiation.   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    Personal history of chemotherapy 2013   LEFT BREAST   Personal history of malignant neoplasm of breast    Personal history of radiation therapy 2017    Rt, 2013 had on left   Stroke ([place]) 09/06/2020   1 cm acute infarction in the right parietal deep and subcortical white matter   Vertigo    Past Surgical History:  Procedure Laterality Date   BREAST BIOPSY Left 07/18/2011   +   BREAST BIOPSY Left 09/21/2015   neg   BREAST BIOPSY Left 04/05/2016   neg   BREAST BIOPSY Left 03/20/2019   [place] bx 2 areas /fat necrosis at 9:30 ribbon and 5:30 coil   BREAST EXCISIONAL BIOPSY Right 08/2015   [facility]   BREAST LUMPECTOMY Right 09/30/2015   [facility] WITH MICROPAPILLARY FEATURES AND DCIS/CLEAR MARGINS, NEGATIVE LN   BREAST LUMPECTOMY Left 08/17/2011   BREAST LUMPECTOMY WITH SENTINEL LYMPH NODE BIOPSY Right 09/30/2015   Procedure: BREAST LUMPECTOMY WITH SENTINEL LYMPH NODE BX;  Surgeon: Robert Bellow, MD;  Location: [facility] ORS;  Service: General;  Laterality: Right;   BREAST SURGERY Left 2012   wide excision   BREAST SURGERY Left November 2013   Core biopsy of the upper-outer quadrant showed fat necrosis.   CHOLECYSTECTOMY  2007   COLONOSCOPY  2011   Dr Candace Cruise   COLONOSCOPY WITH PROPOFOL N/A 02/25/2015   Procedure: COLONOSCOPY WITH PROPOFOL;  Surgeon: Hulen Luster, MD;   Location[facility];  Service: Gastroenterology;  Laterality: N/A;   CYSTOSCOPY WITH STENT PLACEMENT Left 11/05/2020   Procedure: CYSTOSCOPY WITH STENT PLACEMENT;  Surgeon: Abbie Sons, MD;  Location: [facility] ORS;  Service: Urology;  Laterality: Left;   CYSTOSCOPY/URETEROSCOPY/HOLMIUM LASER/STENT PLACEMENT Left 12/07/2020   Procedure: CYSTOSCOPY/URETEROSCOPY/HOLMIUM LASER/STENT PLACEMENT;  Surgeon: Abbie Sons, MD;  Location: [facility] ORS;  Service: Urology;  Laterality: Left;   DILATION AND CURETTAGE OF UTERUS  2004   HERNIA REPAIR Right [id number]   Umbilical/ventral hernia repaired with 6.4 cm Proceed ventral patch   PORT-A-CATH REMOVAL  PORTACATH PLACEMENT  2013   RECURRENT HERNIA N/A 01/07/2008   Recurrent ventral hernia at the umbilicus, laparoscopy, open placement of a large Ultra Pro mesh with trans-fascial sutures.   UPPER GI ENDOSCOPY  2011   Family History  Problem Relation Age of Onset   Lymphoma Father    Ovarian cancer Sister    Colon cancer Brother    Lymphoma Brother    Cancer Other        stomach   Cancer Other        stomach   Breast cancer Neg Hx    Social History   Socioeconomic History   Marital status: Widowed    Spouse name: Not on file   Number of children: 2   Years of education: H/S   Highest education level: 12th grade  Occupational History   Occupation: Retired  Tobacco Use   Smoking status: Never   Smokeless tobacco: Never  Vaping Use   Vaping Use: Never used  Substance and Sexual Activity   Alcohol use: No   Drug use: No   Sexual activity: Not on file  Other Topics Concern   Not on file  Social History Narrative   Lives alone   Social Determinants of Health   Financial Resource Strain: Low Risk  (10/25/2021)   Overall Financial Resource Strain (CARDIA)    Difficulty of Paying Living Expenses: Not hard at all  Food Insecurity: No Food Insecurity (09/26/2022)   Hunger Vital Sign    Worried About Running Out of Food in the Last  Year: Never true    [place] in the Last Year: Never true  Transportation Needs: No Transportation Needs (10/30/2022)   PRAPARE - Hydrologist (Medical): No    Lack of Transportation (Non-Medical): No  Physical Activity: Inactive (10/30/2022)   Exercise Vital Sign    Days of Exercise per Week: 0 days    Minutes of Exercise per Session: 0 min  Stress: No Stress Concern Present (10/30/2022)   [place]    Feeling of Stress : Not at all  Social Connections: Moderately Isolated (10/30/2022)   Social Connection and Isolation Panel [NHANES]    Frequency of Communication with Friends and Family: More than three times a week    Frequency of Social Gatherings with Friends and Family: More than three times a week    Attends Religious Services: 1 to 4 times per year    Active Member of [employer] or Organizations: No    Attends Archivist Meetings: Never    Marital Status: Widowed    Tobacco Counseling Counseling given: Not Answered   Clinical Intake:  Pre-visit preparation completed: Yes  Pain : No/denies pain     BMI - recorded: 27.6 Nutritional Status: BMI 25 -29 Overweight Nutritional Risks: None Diabetes: Yes CBG done?: Yes (pt at home @124 ) CBG resulted in Enter/ Edit results?: No Did pt. bring in CBG monitor from home?: No  How often do you need to have someone help you when you read instructions, pamphlets, or other written materials from your doctor or pharmacy?: 1 - Never  Diabetic?yes  Interpreter Needed?: No  Comments: lives beside daughter Information entered by :: B.Nickalous Stingley,LPN   Activities of Daily Living    10/30/2022    3:32 PM  In your present state of health, do you have any difficulty performing the following activities:  Hearing? 1  Vision? 0  Difficulty concentrating or making decisions? 1  Walking or climbing stairs? 1  Dressing or bathing? 0   Doing errands, shopping? 1  Comment daughter helps:pt does not drive anymore  Preparing Food and eating ? N  Using the Toilet? N  In the past six months, have you accidently leaked urine? Y  Do you have problems with loss of bowel control? N  Managing your Medications? N  Managing your Finances? N  Housekeeping or managing your Housekeeping? Y    Patient Care Team: [name], MD as PCP - General (Family Medicine) Bary Castilla, Forest Gleason, MD (General Surgery) Cammie Sickle, MD as Consulting Physician (Internal Medicine) Lorelee Cover., MD as Consulting Physician (Ophthalmology) Noreene Filbert, MD as Referring Physician (Radiation Oncology) Germaine Pomfret, [facility] as Pharmacist (Pharmacist) Neldon Labella, RN as Case Manager  Indicate any recent Medical Services you may have received from other than [facility] providers in the past year (date may be approximate).     Assessment:   This is a routine wellness examination for Leah Schaefer.  Hearing/Vision screen Hearing Screening - Comments:: Inadequate hearing-needs hearing test Vision Screening - Comments:: Adequate vision w/glasses  Dietary issues and exercise activities discussed: Current Exercise Habits: The patient does not participate in regular exercise at present, Exercise limited by: cardiac condition(s);neurologic condition(s);orthopedic condition(s)   Goals Addressed             This Visit's Progress    DIET - EAT MORE FRUITS AND VEGETABLES   On track    Exercise 3x per week (30 min per time)   Not on track    Recommend to exercise/walk for 3 days a week for at least 30 minutes at a time.         Depression Screen    10/30/2022    3:22 PM 10/13/2022   10:48 AM 08/23/2022    3:48 PM 05/23/2022    3:59 PM 10/25/2021    2:36 PM 04/12/2021    3:08 PM 12/13/2020    3:09 PM  PHQ 2/9 Scores  PHQ - 2 Score 3 6 2 1 2 4 2   PHQ- 9 Score 8 27 4 5  17 3     Fall Risk    10/30/2022    3:18 PM 10/13/2022    10:48 AM 08/23/2022    3:47 PM 05/23/2022    3:58 PM 10/25/2021    2:40 PM  Fall Risk   Falls in the past year? 1 0 0 1 1  Number falls in past yr: 1 0 0 0 1  Injury with Fall? 0 0 0 0 0  Risk for fall due to : Impaired balance/gait No Fall Risks No Fall Risks No Fall Risks History of fall(s)  Follow up Education provided;Falls prevention discussed    Falls prevention discussed    FALL RISK PREVENTION PERTAINING TO THE HOME:  Any stairs in or around the home? No  If so, are there any without handrails? No  Home free of loose throw rugs in walkways, pet beds, electrical cords, etc? Yes  Adequate lighting in your home to reduce risk of falls? Yes   ASSISTIVE DEVICES UTILIZED TO PREVENT FALLS:  Life alert? No  Use of a cane, walker or w/c? Yes cane Grab bars in the bathroom? Yes  Shower chair or bench in shower? Yes  Elevated toilet seat or a handicapped toilet? Yes   Cognitive Function:        10/30/2022    3:25 PM 12/06/2016  1:59 PM  6CIT Screen  What Year? 0 points 0 points  What month? 3 points 0 points  What time? 0 points 0 points  Count back from 20 0 points 0 points  Months in reverse 4 points 4 points  Repeat phrase 8 points 4 points  Total Score 15 points 8 points    Immunizations Immunization History  Administered Date(s) Administered   Fluad Quad(high Dose 65+) 06/24/2019, 05/03/2020, 05/11/2021, 05/23/2022   Influenza, High Dose Seasonal PF 05/13/2014, 05/29/2015, 04/05/2016, 04/19/2017, 04/11/2018   Influenza,inj,Quad PF,6+ Mos 05/23/2013   PFIZER(Purple Top)SARS-COV-2 Vaccination 10/17/2019, 11/11/2019, 07/07/2020   Pneumococcal Conjugate-13 05/13/2014   Pneumococcal Polysaccharide-23 06/07/2012   Td 04/19/2007, 05/05/2011   Tdap 05/05/2011   Zoster, Live 04/27/2008    TDAP status: Up to date  Flu Vaccine status: Up to date  Pneumococcal vaccine status: Up to date  Covid-19 vaccine status: Completed vaccines  Qualifies for Shingles Vaccine?  Yes   Zostavax completed No   Shingrix Completed?: No.    Education has been provided regarding the importance of this vaccine. Patient has been advised to call insurance company to determine out of pocket expense if they have not yet received this vaccine. Advised may also receive vaccine at local pharmacy or Health Dept. Verbalized acceptance and understanding.  Screening Tests Health Maintenance  Topic Date Due   Zoster Vaccines- Shingrix (1 of 2) Never done   OPHTHALMOLOGY EXAM  01/10/2019   DTaP/Tdap/Td (4 - Td or Tdap) 05/04/2021   COVID-19 Vaccine (4 - 2023-24 season) 04/07/2022   HEMOGLOBIN A1C  02/21/2023   FOOT EXAM  05/24/2023   Diabetic kidney evaluation - Urine ACR  08/24/2023   Diabetic kidney evaluation - eGFR measurement  09/24/2023   Medicare Annual Wellness (AWV)  10/30/2023   Pneumonia Vaccine 66+ Years old  Completed   INFLUENZA VACCINE  Completed   DEXA SCAN  Completed   HPV VACCINES  Aged Out    Health Maintenance  Health Maintenance Due  Topic Date Due   Zoster Vaccines- Shingrix (1 of 2) Never done   OPHTHALMOLOGY EXAM  01/10/2019   DTaP/Tdap/Td (4 - Td or Tdap) 05/04/2021   COVID-19 Vaccine (4 - 2023-24 season) 04/07/2022    Colorectal cancer screening: No longer required.   Mammogram status: No longer required due to age.  Bone Density status: Completed yes. Results reflect: Bone density results: NORMAL. Repeat every 5 years.  Lung Cancer Screening: (Low Dose CT Chest recommended if Age 60-80 years, 30 pack-year currently smoking OR have quit w/in 15years.) does not qualify.   Lung Cancer Screening Referral: no  Additional Screening:  Hepatitis C Screening: does not qualify; Completed yes  Vision Screening: Recommended annual ophthalmology exams for early detection of glaucoma and other disorders of the eye. Is the patient up to date with their annual eye exam?  Yes  Who is the provider or what is the name of the office in which the patient  attends annual eye exams? Not recall If pt is not established with a provider, would they like to be referred to a provider to establish care? No .   Dental Screening: Recommended annual dental exams for proper oral hygiene  Community Resource Referral / Chronic Care Management: CRR required this visit?  No   CCM required this visit?  No      Plan:     I have personally reviewed and noted the following in the patient's chart:   Medical and social history Use of  alcohol, tobacco or illicit drugs  Current medications and supplements including opioid prescriptions. Patient is not currently taking opioid prescriptions. Functional ability and status Nutritional status Physical activity Advanced directives List of other physicians Hospitalizations, surgeries, and ER visits in previous 12 months Vitals Screenings to include cognitive, depression, and falls Referrals and appointments  In addition, I have reviewed and discussed with patient certain preventive protocols, quality metrics, and best practice recommendations. A written personalized care plan for preventive services as well as general preventive health recommendations were provided to patient.     Roger Shelter, LPN   [id number]   Nurse Notes: pt is maintaining at her baseline. She does ask for recorder to repeat questions a bit. She relays her daughter has said she needs to get her hearing checked.  Pt has no questions or concerns  Referral to Audiology/placed

## 2022-10-30 NOTE — Patient Instructions (Signed)
Ms. Leah Schaefer , Thank you for taking time to come for your Medicare Wellness Visit. I appreciate your ongoing commitment to your health goals. Please review the following plan we discussed and let me know if I can assist you in the future.   These are the goals we discussed:  Goals      DIET - EAT MORE FRUITS AND VEGETABLES     DIET - INCREASE LEAN PROTEINS     Recommend to continue current diet regimen of eating chicken that is broil or grilled only and avoiding other fatty meats.      Exercise 3x per week (30 min per time)     Recommend to exercise/walk for 3 days a week for at least 30 minutes at a time.       Increase lean proteins     Recommend decreasing fatty meats and increasing lean meats in diet.     Monitor and Manage My Blood Sugar-Diabetes Type 2     Timeframe:  Long-Range Goal Priority:  High Start Date:  02/21/2021                            Expected End Date:  02/22/2023                     Follow Up within 90 days   - check blood sugar at prescribed times - check blood sugar if I feel it is too high or too low - enter blood sugar readings and medication or insulin into daily log    Why is this important?   Checking your blood sugar at home helps to keep it from getting very high or very low.  Writing the results in a diary or log helps the doctor know how to care for you.  Your blood sugar log should have the time, date and the results.  Also, write down the amount of insulin or other medicine that you take.  Other information, like what you ate, exercise done and how you were feeling, will also be helpful.     Notes:         This is a list of the screening recommended for you and due dates:  Health Maintenance  Topic Date Due   Zoster (Shingles) Vaccine (1 of 2) Never done   Eye exam for diabetics  01/10/2019   DTaP/Tdap/Td vaccine (4 - Td or Tdap) 05/04/2021   COVID-19 Vaccine (4 - 2023-24 season) 04/07/2022   Hemoglobin A1C  02/21/2023   Complete foot  exam   05/24/2023   Yearly kidney health urinalysis for diabetes  08/24/2023   Yearly kidney function blood test for diabetes  09/24/2023   Medicare Annual Wellness Visit  10/30/2023   Pneumonia Vaccine  Completed   Flu Shot  Completed   DEXA scan (bone density measurement)  Completed   HPV Vaccine  Aged Out    Advanced directives: non  Conditions/risks identified: falls risk  Next appointment: Follow up in one year for your annual wellness visit 10/31/2023 @2 :30pm telephone   Preventive Care 65 Years and Older, Female Preventive care refers to lifestyle choices and visits with your health care provider that can promote health and wellness. What does preventive care include? A yearly physical exam. This is also called an annual well check. Dental exams once or twice a year. Routine eye exams. Ask your health care provider how often you should have your eyes checked. Personal  lifestyle choices, including: Daily care of your teeth and gums. Regular physical activity. Eating a healthy diet. Avoiding tobacco and drug use. Limiting alcohol use. Practicing safe sex. Taking low-dose aspirin every day. Taking vitamin and mineral supplements as recommended by your health care provider. What happens during an annual well check? The services and screenings done by your health care provider during your annual well check will depend on your age, overall health, lifestyle risk factors, and family history of disease. Counseling  Your health care provider may ask you questions about your: Alcohol use. Tobacco use. Drug use. Emotional well-being. Home and relationship well-being. Sexual activity. Eating habits. History of falls. Memory and ability to understand (cognition). Work and work Statistician. Reproductive health. Screening  You may have the following tests or measurements: Height, weight, and BMI. Blood pressure. Lipid and cholesterol levels. These may be checked every 5  years, or more frequently if you are over 17 years old. Skin check. Lung cancer screening. You may have this screening every year starting at age 96 if you have a 30-pack-year history of smoking and currently smoke or have quit within the past 15 years. Fecal occult blood test (FOBT) of the stool. You may have this test every year starting at age 38. Flexible sigmoidoscopy or colonoscopy. You may have a sigmoidoscopy every 5 years or a colonoscopy every 10 years starting at age 36. Hepatitis C blood test. Hepatitis B blood test. Sexually transmitted disease (STD) testing. Diabetes screening. This is done by checking your blood sugar (glucose) after you have not eaten for a while (fasting). You may have this done every 1-3 years. Bone density scan. This is done to screen for osteoporosis. You may have this done starting at age 38. Mammogram. This may be done every 1-2 years. Talk to your health care provider about how often you should have regular mammograms. Talk with your health care provider about your test results, treatment options, and if necessary, the need for more tests. Vaccines  Your health care provider may recommend certain vaccines, such as: Influenza vaccine. This is recommended every year. Tetanus, diphtheria, and acellular pertussis (Tdap, Td) vaccine. You may need a Td booster every 10 years. Zoster vaccine. You may need this after age 14. Pneumococcal 13-valent conjugate (PCV13) vaccine. One dose is recommended after age 31. Pneumococcal polysaccharide (PPSV23) vaccine. One dose is recommended after age 75. Talk to your health care provider about which screenings and vaccines you need and how often you need them. This information is not intended to replace advice given to you by your health care provider. Make sure you discuss any questions you have with your health care provider. Document Released: 08/20/2015 Document Revised: 04/12/2016 Document Reviewed: 05/25/2015 Elsevier  Interactive Patient Education  2017 [place] Prevention in the Home Falls can cause injuries. They can happen to people of all ages. There are many things you can do to make your home safe and to help prevent falls. What can I do on the outside of my home? Regularly fix the edges of walkways and driveways and fix any cracks. Remove anything that might make you trip as you walk through a door, such as a raised step or threshold. Trim any bushes or trees on the path to your home. Use bright outdoor lighting. Clear any walking paths of anything that might make someone trip, such as rocks or tools. Regularly check to see if handrails are loose or broken. Make sure that both sides of any  steps have handrails. Any raised decks and porches should have guardrails on the edges. Have any leaves, snow, or ice cleared regularly. Use sand or salt on walking paths during winter. Clean up any spills in your garage right away. This includes oil or grease spills. What can I do in the bathroom? Use night lights. Install grab bars by the toilet and in the tub and shower. Do not use towel bars as grab bars. Use non-skid mats or decals in the tub or shower. If you need to sit down in the shower, use a plastic, non-slip stool. Keep the floor dry. Clean up any water that spills on the floor as soon as it happens. Remove soap buildup in the tub or shower regularly. Attach bath mats securely with double-sided non-slip rug tape. Do not have throw rugs and other things on the floor that can make you trip. What can I do in the bedroom? Use night lights. Make sure that you have a light by your bed that is easy to reach. Do not use any sheets or blankets that are too big for your bed. They should not hang down onto the floor. Have a firm chair that has side arms. You can use this for support while you get dressed. Do not have throw rugs and other things on the floor that can make you trip. What can I do  in the kitchen? Clean up any spills right away. Avoid walking on wet floors. Keep items that you use a lot in easy-to-reach places. If you need to reach something above you, use a strong step stool that has a grab bar. Keep electrical cords out of the way. Do not use floor polish or wax that makes floors slippery. If you must use wax, use non-skid floor wax. Do not have throw rugs and other things on the floor that can make you trip. What can I do with my stairs? Do not leave any items on the stairs. Make sure that there are handrails on both sides of the stairs and use them. Fix handrails that are broken or loose. Make sure that handrails are as long as the stairways. Check any carpeting to make sure that it is firmly attached to the stairs. Fix any carpet that is loose or worn. Avoid having throw rugs at the top or bottom of the stairs. If you do have throw rugs, attach them to the floor with carpet tape. Make sure that you have a light switch at the top of the stairs and the bottom of the stairs. If you do not have them, ask someone to add them for you. What else can I do to help prevent falls? Wear shoes that: Do not have high heels. Have rubber bottoms. Are comfortable and fit you well. Are closed at the toe. Do not wear sandals. If you use a stepladder: Make sure that it is fully opened. Do not climb a closed stepladder. Make sure that both sides of the stepladder are locked into place. Ask someone to hold it for you, if possible. Clearly mark and make sure that you can see: Any grab bars or handrails. First and last steps. Where the edge of each step is. Use tools that help you move around (mobility aids) if they are needed. These include: Canes. Walkers. Scooters. Crutches. Turn on the lights when you go into a dark area. Replace any light bulbs as soon as they burn out. Set up your furniture so you have a clear path.  Avoid moving your furniture around. If any of your  floors are uneven, fix them. If there are any pets around you, be aware of where they are. Review your medicines with your doctor. Some medicines can make you feel dizzy. This can increase your chance of falling. Ask your doctor what other things that you can do to help prevent falls. This information is not intended to replace advice given to you by your health care provider. Make sure you discuss any questions you have with your health care provider. Document Released: 05/20/2009 Document Revised: 12/30/2015 Document Reviewed: 08/28/2014 Elsevier Interactive Patient Education  2017 [employer].

## 2022-10-30 NOTE — Telephone Encounter (Signed)
   CCM RN Visit Note   10/30/22 Name: SKYLYNN BOTERO MRN: [id number]      DOB: Aug 24, 1940  Subjective: CYNITHA OBERHOLTZER is a 82 y.o. year old female who is a primary care patient of Simmons-Robinson, Makiera, MD. The patient was referred to the Chronic Care Management team for assistance with care management needs subsequent to provider initiation of CCM services and plan of care.      An unsuccessful telephone outreach was attempted today. Phone rang multiple times without option to leave a voice message.  PLAN: Will make additional outreach attempts.   Horris Latino RN Care Manager/Chronic Care Management [phone]

## 2022-10-30 NOTE — Telephone Encounter (Signed)
   CCM RN Visit Note   10/30/22 Name: JASZMIN DIETERT MRN: [id number]      DOB: 24-Jan-1941  Subjective: Leah Schaefer is a 82 y.o. year old female who is a primary care patient of Simmons-Robinson, Makiera, MD. The patient was referred to the Chronic Care Management team for assistance with care management needs subsequent to provider initiation of CCM services and plan of care.      Ms. Wheeldon returned call. Anticipates being available via telephone for initial CCM assessment on 11/06/22.    PLAN: Appointment scheduled for 11/06/22 at 1500.   Horris Latino RN Care Manager/Chronic Care Management [phone]

## 2022-11-06 ENCOUNTER — Telehealth: Payer: Medicare HMO

## 2022-11-06 ENCOUNTER — Telehealth: Payer: Self-pay

## 2022-11-06 NOTE — Telephone Encounter (Signed)
   CCM RN Visit Note   11/06/22 Name: Leah Schaefer MRN: [id number]      DOB: [birth year]  Subjective: Leah Schaefer is a 82 y.o. year old female who is a primary care patient of Simmons-Robinson, Makiera, MD. The patient was referred to the Chronic Care Management team for assistance with care management needs subsequent to provider initiation of CCM services and plan of care.      An unsuccessful outreach attempt was made today for a scheduled CCM visit.   PLAN: Unable to leave a voice message today.  The care team will make additional outreach attempts   [place] Manager/Chronic Care Management [phone]

## 2022-11-16 DIAGNOSIS — R399 Unspecified symptoms and signs involving the genitourinary system: Secondary | ICD-10-CM | POA: Diagnosis not present

## 2022-11-17 DIAGNOSIS — R399 Unspecified symptoms and signs involving the genitourinary system: Secondary | ICD-10-CM | POA: Diagnosis not present

## 2022-11-23 DIAGNOSIS — H903 Sensorineural hearing loss, bilateral: Secondary | ICD-10-CM | POA: Diagnosis not present

## 2022-11-28 ENCOUNTER — Other Ambulatory Visit: Payer: Self-pay

## 2022-11-28 ENCOUNTER — Emergency Department (HOSPITAL_COMMUNITY): Payer: Medicare HMO

## 2022-11-28 ENCOUNTER — Emergency Department (HOSPITAL_COMMUNITY)
Admission: EM | Admit: 2022-11-28 | Discharge: 2022-11-29 | Disposition: A | Discharge status: Home / Self Care | Payer: Medicare HMO | Attending: Emergency Medicine | Admitting: Emergency Medicine

## 2022-11-28 ENCOUNTER — Encounter (HOSPITAL_COMMUNITY): Payer: Self-pay

## 2022-11-28 DIAGNOSIS — Z79899 Other long term (current) drug therapy: Secondary | ICD-10-CM | POA: Insufficient documentation

## 2022-11-28 DIAGNOSIS — R079 Chest pain, unspecified: Secondary | ICD-10-CM | POA: Diagnosis not present

## 2022-11-28 DIAGNOSIS — E119 Type 2 diabetes mellitus without complications: Secondary | ICD-10-CM | POA: Insufficient documentation

## 2022-11-28 DIAGNOSIS — W01198A Fall on same level from slipping, tripping and stumbling with subsequent striking against other object, initial encounter: Secondary | ICD-10-CM | POA: Diagnosis not present

## 2022-11-28 DIAGNOSIS — Z7984 Long term (current) use of oral hypoglycemic drugs: Secondary | ICD-10-CM | POA: Insufficient documentation

## 2022-11-28 DIAGNOSIS — Z853 Personal history of malignant neoplasm of breast: Secondary | ICD-10-CM | POA: Diagnosis not present

## 2022-11-28 DIAGNOSIS — Z85831 Personal history of malignant neoplasm of soft tissue: Secondary | ICD-10-CM | POA: Insufficient documentation

## 2022-11-28 DIAGNOSIS — E118 Type 2 diabetes mellitus with unspecified complications: Secondary | ICD-10-CM | POA: Diagnosis not present

## 2022-11-28 DIAGNOSIS — G309 Alzheimer's disease, unspecified: Secondary | ICD-10-CM | POA: Diagnosis not present

## 2022-11-28 DIAGNOSIS — I10 Essential (primary) hypertension: Secondary | ICD-10-CM | POA: Insufficient documentation

## 2022-11-28 DIAGNOSIS — F0150 Vascular dementia without behavioral disturbance: Secondary | ICD-10-CM | POA: Diagnosis not present

## 2022-11-28 DIAGNOSIS — I6930 Unspecified sequelae of cerebral infarction: Secondary | ICD-10-CM | POA: Diagnosis not present

## 2022-11-28 DIAGNOSIS — S0990XA Unspecified injury of head, initial encounter: Secondary | ICD-10-CM | POA: Insufficient documentation

## 2022-11-28 DIAGNOSIS — W19XXXA Unspecified fall, initial encounter: Secondary | ICD-10-CM | POA: Diagnosis not present

## 2022-11-28 DIAGNOSIS — F0390 Unspecified dementia without behavioral disturbance: Secondary | ICD-10-CM | POA: Insufficient documentation

## 2022-11-28 DIAGNOSIS — F0280 Dementia in other diseases classified elsewhere without behavioral disturbance: Secondary | ICD-10-CM | POA: Diagnosis not present

## 2022-11-28 DIAGNOSIS — G939 Disorder of brain, unspecified: Secondary | ICD-10-CM | POA: Diagnosis not present

## 2022-11-28 DIAGNOSIS — R296 Repeated falls: Secondary | ICD-10-CM | POA: Diagnosis not present

## 2022-11-28 DIAGNOSIS — I69354 Hemiplegia and hemiparesis following cerebral infarction affecting left non-dominant side: Secondary | ICD-10-CM | POA: Diagnosis not present

## 2022-11-28 DIAGNOSIS — G5793 Unspecified mononeuropathy of bilateral lower limbs: Secondary | ICD-10-CM | POA: Diagnosis not present

## 2022-11-28 DIAGNOSIS — S199XXA Unspecified injury of neck, initial encounter: Secondary | ICD-10-CM | POA: Diagnosis not present

## 2022-11-28 LAB — CBC WITH DIFFERENTIAL/PLATELET
Abs Immature Granulocytes: 0.01 10*3/uL (ref 0.00–0.07)
Basophils Absolute: 0.1 10*3/uL (ref 0.0–0.1)
Basophils Relative: 1 %
Eosinophils Absolute: 0.2 10*3/uL (ref 0.0–0.5)
Eosinophils Relative: 3 %
HCT: 42.9 % (ref 36.0–46.0)
Hemoglobin: 14.2 g/dL (ref 12.0–15.0)
Immature Granulocytes: 0 %
Lymphocytes Relative: 27 %
Lymphs Abs: 1.5 10*3/uL (ref 0.7–4.0)
MCH: 30.2 pg (ref 26.0–34.0)
MCHC: 33.1 g/dL (ref 30.0–36.0)
MCV: 91.3 fL (ref 80.0–100.0)
Monocytes Absolute: 0.5 10*3/uL (ref 0.1–1.0)
Monocytes Relative: 8 %
Neutro Abs: 3.3 10*3/uL (ref 1.7–7.7)
Neutrophils Relative %: 61 %
Platelets: 229 10*3/uL (ref 150–400)
RBC: 4.7 MIL/uL (ref 3.87–5.11)
RDW: 12.8 % (ref 11.5–15.5)
WBC: 5.4 10*3/uL (ref 4.0–10.5)
nRBC: 0 % (ref 0.0–0.2)

## 2022-11-28 LAB — URINALYSIS, ROUTINE W REFLEX MICROSCOPIC
Bacteria, UA: NONE SEEN
Bilirubin Urine: NEGATIVE
Glucose, UA: NEGATIVE mg/dL
Hgb urine dipstick: NEGATIVE
Ketones, ur: NEGATIVE mg/dL
Nitrite: NEGATIVE
Protein, ur: NEGATIVE mg/dL
Specific Gravity, Urine: 1.005 (ref 1.005–1.030)
pH: 6 (ref 5.0–8.0)

## 2022-11-28 LAB — BASIC METABOLIC PANEL
Anion gap: 11 (ref 5–15)
BUN: 9 mg/dL (ref 8–23)
CO2: 27 mmol/L (ref 22–32)
Calcium: 10.1 mg/dL (ref 8.9–10.3)
Chloride: 101 mmol/L (ref 98–111)
Creatinine, Ser: 0.98 mg/dL (ref 0.44–1.00)
GFR, Estimated: 58 mL/min — ABNORMAL LOW (ref >60)
Glucose, Bld: 114 mg/dL — ABNORMAL HIGH (ref 70–99)
Potassium: 4.2 mmol/L (ref 3.5–5.1)
Sodium: 139 mmol/L (ref 135–145)

## 2022-11-28 LAB — TROPONIN I (HIGH SENSITIVITY)
Troponin I (High Sensitivity): 8 ng/L (ref <18)
Troponin I (High Sensitivity): 7 ng/L (ref <18)

## 2022-11-28 NOTE — Discharge Instructions (Signed)
Based on the events which brought you to the ER today, it is possible that you may have a concussion. A concussion occurs when there is a blow to the head or body, with enough force to shake the brain and disrupt how the brain functions. You may experience symptoms such as headaches, sensitivity to light/noise, dizziness, cognitive slowing, difficulty concentrating / remembering, trouble sleeping and drowsiness. These symptoms may last anywhere from hours/days to potentially weeks/months. While these symptoms are very frustrating and perhaps debilitating, it is important that you remember that they will improve over time. Everyone has a different rate of recovery; it is difficult to predict when your symptoms will resolve. In order to allow for your brain to heal after the injury, we recommend that you see your primary physician or a physician knowledgeable in concussion management. We also advise you to let your body and brain rest: avoid physical activities (sports, gym, and exercise) and reduce cognitive demands (reading, texting, TV watching, computer use, video games, etc). School attendance, after-school activities and work may need to be modified to avoid increasing symptoms. We recommend against driving until until all symptoms have resolved. Come back to the ER right away if you are having repeated episodes of vomiting, severe/worsening headache/dizziness or any other symptom that alarms you. We recommended that someone stay with you for the next 24 hours to monitor for these worrisome symptoms.   It was a pleasure caring for you today in the emergency department.  Please return to the emergency department for any worsening or worrisome symptoms.  

## 2022-11-28 NOTE — ED Triage Notes (Signed)
Patient bib [facility] from home after she had a fall. EMS reports patient fell backwards and hit her head, she is A&Ox3. She is disoriented to time which is baseline. She is on a blood thinner.

## 2022-11-28 NOTE — ED Provider Notes (Signed)
[place] EMERGENCY DEPARTMENT AT [facility] Provider Note  CSN: [id number] Arrival date & time: 11/28/22 1711  Chief Complaint(s) Fall  HPI Leah Schaefer is a 82 y.o. female with past medical history as below, significant for breast cancer, DM, dementia, GERD, prior CVA with hemiparesis of left side, HLD, HTN, vertigo who presents to the ED with complaint of fall, syncope.  Witnessed fall this afternoon by family and friend.  She just left her neurologist office they were returning home, patient was attempting to ambulate up the stairs to her residence when she slipped and fell backwards, hit her head on the ground and had brief LOC of less than 5 seconds.  Patient does not recall the fall or the events leading up to the fall.  She remembers driving home from the neurologist office with her daughter does not remember getting out of her vehicle or walking towards her house.  Bystander/friend reports that she was unresponsive for less than 5 seconds, no seizure activity was observed, no incontinence, no vomiting.  She denies any numbness or tingling seems new, no vision changes, no hearing changes, she has some vague chest discomfort starting while she was in the emergency department.  No palpitations or dyspnea.  No nausea or vomiting.  No abdominal pain.  No pain to extremities.  No bleeding reported.  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 Chemotherapy/post wide excision whole breast radiation.   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    Personal history of chemotherapy 2013   LEFT BREAST   Personal history of malignant neoplasm of breast    Personal history of radiation therapy 2017    Rt, 2013 had on left   Stroke 09/06/2020   1 cm acute infarction in the right parietal deep and subcortical white matter   Vertigo    Patient Active Problem List   Diagnosis Date Noted   Dysfunction of left eustachian tube 10/13/2022   Decreased hearing of left ear 10/13/2022   SOB (shortness of breath) 10/03/2022   Caregiver stress 08/23/2022   Need for influenza vaccination 05/23/2022   Depressed affect 05/23/2022   Annual physical exam 05/23/2022   Type 2 diabetes mellitus with complication, without long-term current use of insulin 12/17/2020   Severe sepsis 11/05/2020   Complicated UTI (urinary tract infection) 11/05/2020   Hydronephrosis with renal and ureteral calculus obstruction 11/05/2020   History of breast cancer 11/05/2020   History of CVA (cerebrovascular accident) 11/05/2020   AKI (acute kidney injury) 11/05/2020   Hemiparesis affecting left side  as late effect of stroke 11/05/2020   Postural dizziness with presyncope 11/05/2020   Stroke 09/06/2020   Leukocytes in urine 06/09/2020   Grief 06/09/2020   Episode of recurrent major depressive disorder 06/09/2020   Urinary symptom or sign 06/09/2020   Left sided numbness 11/06/2018   Fat necrosis of breast 12/25/2017   Umbilical pain 07/12/2017   Breast mass, left 04/07/2016   Allergic rhinitis 03/30/2015   Anxiety 03/30/2015   Carotid arterial disease 03/30/2015   Diabetes mellitus, type 2 03/30/2015    Essential (primary) hypertension 03/30/2015   Hypercholesteremia 03/30/2015   Left leg pain 03/30/2015   LBP (low back pain) 03/30/2015   Neuropathic pain 03/30/2015   Burning or prickling sensation 03/30/2015   Neuralgia neuritis, sciatic nerve 03/30/2015   Skin cyst 11/24/2014   Carcinoma of upper-outer quadrant of right breast in female, estrogen receptor positive 08/17/2011   Allergic reaction 11/29/2009   Adaptation reaction 04/30/2009   Acid reflux 04/30/2009   Stricture and stenosis of cervix 01/28/2009   Home Medication(s) Prior to Admission medications   Medication Sig Start Date End Date Taking? Authorizing Provider  ACCU-CHEK AVIVA PLUS test strip CHECK  FASTING  BLOOD  GLUCOSE EVERY DAY 03/22/22   Bosie Clos, MD  Accu-Chek Softclix Lancets lancets TEST BLOOD SUGAR EVERY DAY 03/22/22   Bosie Clos, MD  acetaminophen (TYLENOL) 500 MG tablet Take 500 mg by mouth every 6 (six) hours as needed for mild pain or headache.     [provider]  atorvastatin (LIPITOR) 40 MG tablet TAKE 1 TABLET EVERY EVENING 03/22/22   Bosie Clos, MD  buPROPion (WELLBUTRIN XL) 300 MG 24 hr tablet Take 1 tablet (300 mg total) by mouth daily. 10/13/21   Bosie Clos, MD  Calcium Carb-Cholecalciferol 600-800 MG-UNIT TABS Take 1 tablet by mouth daily.    [provider]  cholecalciferol (VITAMIN D) 1000 units tablet Take 1,000 Units by mouth daily.    [provider]  clopidogrel (PLAVIX) 75 MG tablet TAKE 1 TABLET EVERY DAY 05/26/22   Simmons-Robinson, Makiera, MD  cyanocobalamin 500 MCG tablet Take 500 mcg by mouth daily.    [provider]  fluticasone (FLONASE) 50 MCG/ACT nasal spray Place 2 sprays into both nostrils daily. 10/13/22   Simmons-Robinson, Makiera, MD  latanoprost (XALATAN) 0.005 % ophthalmic solution Place 1 drop into both eyes at bedtime.  10/11/16   [provider]  losartan (COZAAR) 50 MG tablet TAKE 1 TABLET EVERY DAY  03/14/22   Bosie Clos, MD  meclizine (ANTIVERT) 25 MG tablet Take 0.5 tablets (12.5 mg total) by mouth 3 (three) times daily as needed for dizziness. 10/27/15   Loleta Rose, MD  metFORMIN (GLUCOPHAGE) 1000 MG tablet TAKE 1 TABLET ONCE DAILY WITH A MEAL 10/13/22   Simmons-Robinson, Tawanna Cooler, MD  metoprolol tartrate (LOPRESSOR) 25 MG tablet Take 0.5 tablets (12.5 mg total) by mouth 2 (two) times daily. 09/25/22   Simmons-Robinson, Tawanna Cooler, MD  Multiple Vitamin (MULTIVITAMIN WITH MINERALS) TABS tablet Take 1 tablet by mouth daily.    [provider]  ondansetron (ZOFRAN-ODT) 4 MG disintegrating tablet Take 1 tablet (4 mg total) by mouth every 8 (eight) hours as needed. 09/23/22   Georga Hacking, MD  pantoprazole (PROTONIX) 40 MG tablet Take 1 tablet (40 mg total) by mouth daily. 06/15/22   Simmons-Robinson, Makiera, MD  pyridoxine (B-6) 100 MG tablet Take 100 mg by mouth daily.    [provider]  sertraline (ZOLOFT)  100 MG tablet Take 0.5 tablets (50 mg total) by mouth daily. 09/21/22   Simmons-Robinson, Tawanna Cooler, MD  vitamin C (ASCORBIC ACID) 500 MG tablet Take 500 mg by mouth daily.    [provider]  vitamin E 400 UNIT capsule Take 400 Units by mouth daily.    [provider]                                                                                                                                    Past Surgical History Past Surgical History:  Procedure Laterality Date   BREAST BIOPSY Left 07/18/2011   +   BREAST BIOPSY Left 09/21/2015   neg   BREAST BIOPSY Left 04/05/2016   neg   BREAST BIOPSY Left 03/20/2019   [place] bx 2 areas /fat necrosis at 9:30 ribbon and 5:30 coil   BREAST EXCISIONAL BIOPSY Right 08/2015   [facility]   BREAST LUMPECTOMY Right 09/30/2015   [facility] WITH MICROPAPILLARY FEATURES AND DCIS/CLEAR MARGINS, NEGATIVE LN   BREAST LUMPECTOMY Left 08/17/2011   BREAST LUMPECTOMY WITH SENTINEL LYMPH NODE BIOPSY Right 09/30/2015   Procedure: BREAST  LUMPECTOMY WITH SENTINEL LYMPH NODE BX;  Surgeon: Earline Mayotte, MD;  Location: [facility] ORS;  Service: General;  Laterality: Right;   BREAST SURGERY Left 2012   wide excision   BREAST SURGERY Left November 2013   Core biopsy of the upper-outer quadrant showed fat necrosis.   CHOLECYSTECTOMY  2007   COLONOSCOPY  2011   Dr Bluford Kaufmann   COLONOSCOPY WITH PROPOFOL N/A 02/25/2015   Procedure: COLONOSCOPY WITH PROPOFOL;  Surgeon: Wallace Cullens, MD;  Location: [facility] ENDOSCOPY;  Service: Gastroenterology;  Laterality: N/A;   CYSTOSCOPY WITH STENT PLACEMENT Left 11/05/2020   Procedure: CYSTOSCOPY WITH STENT PLACEMENT;  Surgeon: Riki Altes, MD;  Location: [facility] ORS;  Service: Urology;  Laterality: Left;   CYSTOSCOPY/URETEROSCOPY/HOLMIUM LASER/STENT PLACEMENT Left 12/07/2020   Procedure: CYSTOSCOPY/URETEROSCOPY/HOLMIUM LASER/STENT PLACEMENT;  Surgeon: Riki Altes, MD;  Location: [facility] ORS;  Service: Urology;  Laterality: Left;   DILATION AND CURETTAGE OF UTERUS  2004   HERNIA REPAIR Right 09/10/2007   Umbilical/ventral hernia repaired with 6.4 cm Proceed ventral patch   PORT-A-CATH REMOVAL     PORTACATH PLACEMENT  2013   RECURRENT HERNIA N/A 01/07/2008   Recurrent ventral hernia at the umbilicus, laparoscopy, open placement of a large Ultra Pro mesh with trans-fascial sutures.   UPPER GI ENDOSCOPY  2011   Family History Family History  Problem Relation Age of Onset   Lymphoma Father    Ovarian cancer Sister    Colon cancer Brother    Lymphoma Brother    Cancer Other        stomach   Cancer Other        stomach   Breast cancer Neg Hx     Social History Social History   Tobacco Use   Smoking status: Never  Smokeless tobacco: Never  Vaping Use   Vaping Use: Never used  Substance Use Topics   Alcohol use: No   Drug use: No   Allergies Patient has no known allergies.  Review of Systems Review of Systems  Constitutional:  Negative for activity change and fever.  HENT:  Negative for  facial swelling and trouble swallowing.   Eyes:  Negative for discharge and redness.  Respiratory:  Negative for cough and shortness of breath.   Cardiovascular:  Positive for chest pain. Negative for palpitations.  Gastrointestinal:  Negative for abdominal pain and nausea.  Genitourinary:  Negative for dysuria and flank pain.  Musculoskeletal:  Negative for back pain and gait problem.  Skin:  Negative for pallor and rash.  Neurological:  Positive for syncope. Negative for headaches.    Physical Exam Vital Signs  I have reviewed the triage vital signs BP (!) 166/82   Pulse 71   Temp 98.5 F (36.9 C)   Resp 18   Ht  (1.676 m)   Wt 78.9 kg   SpO2 100%   BMI 28.08 kg/m  Physical Exam Vitals and nursing note reviewed.  Constitutional:      General: She is not in acute distress.    Appearance: Normal appearance.  HENT:     Head: Normocephalic and atraumatic. No raccoon eyes, Battle's sign, right periorbital erythema or left periorbital erythema.     Jaw: There is normal jaw occlusion. No trismus.     Right Ear: External ear normal.     Left Ear: External ear normal.     Nose: Nose normal.     Mouth/Throat:     Mouth: Mucous membranes are moist.  Eyes:     General: No scleral icterus.       Right eye: No discharge.        Left eye: No discharge.     Extraocular Movements: Extraocular movements intact.     Pupils: Pupils are equal, round, and reactive to light.  Neck:     Comments: C-collar Cardiovascular:     Rate and Rhythm: Normal rate and regular rhythm.     Pulses: Normal pulses.     Heart sounds: Normal heart sounds.  Pulmonary:     Effort: Pulmonary effort is normal. No respiratory distress.     Breath sounds: Normal breath sounds.  Abdominal:     General: Abdomen is flat.     Palpations: Abdomen is soft.     Tenderness: There is no abdominal tenderness. There is no guarding or rebound.  Musculoskeletal:     Right lower leg: No edema.     Left lower leg:  No edema.     Comments: No midline spinous process tenderness to palpation or percussion, no crepitus or step-off.   Pelvis stable to AP pressure  Significant LE pain with logroll bilateral  LE NVI   Skin:    General: Skin is warm and dry.     Capillary Refill: Capillary refill takes less than 2 seconds.  Neurological:     Mental Status: She is alert and oriented to person, place, and time.     GCS: GCS eye subscore is 4. GCS verbal subscore is 5. GCS motor subscore is 6.     Cranial Nerves: Cranial nerves 2-12 are intact. No dysarthria.     Sensory: Sensation is intact.     Motor: Motor function is intact. No tremor or pronator drift.     Coordination: Coordination is intact.  Comments: Gait not tested 2/2 pt safety   5/5 strength bilateral upper and lower extremities Able to hold extremities over bed for 5 seconds without falling  Psychiatric:        Mood and Affect: Mood normal.        Behavior: Behavior normal.     ED Results and Treatments Labs (all labs ordered are listed, but only abnormal results are displayed) Labs Reviewed - No data to display                                                                                                                        Radiology No results found.  Pertinent labs & imaging results that were available during my care of the patient were reviewed by me and considered in my medical decision making (see MDM for details).  Medications Ordered in ED Medications - No data to display                                                                                                                                   Procedures Procedures  (including critical care time)  Medical Decision Making / ED Course    Medical Decision Making:    Leah Schaefer is a 82 y.o. female with past medical history as below, significant for breast cancer, dementia, DM, GERD, prior CVA with hemiparesis of left side, HLD, HTN, vertigo who  presents to the ED with complaint of fall, syncope.. The complaint involves an extensive differential diagnosis and also carries with it a high risk of complications and morbidity.  Serious etiology was considered. Ddx includes but is not limited to: Differential diagnoses for head trauma includes subdural hematoma, epidural hematoma, acute concussion, traumatic subarachnoid hemorrhage, cerebral contusions, etc. Differential includes all life-threatening causes for chest pain. This includes but is not exclusive to acute coronary syndrome, aortic dissection, pulmonary embolism, cardiac tamponade, community-acquired pneumonia, pericarditis, musculoskeletal chest wall pain, etc.   Complete initial physical exam performed, notably the patient  was no acute distress, resting comfortably, vital signs stable..    Reviewed and confirmed nursing documentation for past medical history, family history, social history.  Vital signs reviewed.        Additional history obtained: -Additional history obtained from family and friend -External records from outside source obtained and reviewed including: Chart review including previous notes, labs, imaging, consultation notes including home medications, primary care documentation, prior  ED visits, prior labs and imaging She saw Dr. Sherryll Burger neurology [place] [facility] earlier today   Lab Tests: -I ordered, reviewed, and interpreted labs.   The pertinent results include:   Labs Reviewed - No data to display  Notable for ***  EKG   EKG Interpretation  Date/Time:    Ventricular Rate:    PR Interval:    QRS Duration:   QT Interval:    QTC Calculation:   R Axis:     Text Interpretation:           Imaging Studies ordered: I ordered imaging studies including *** I independently visualized the following imaging with scope of interpretation limited to determining acute life threatening conditions related to emergency care; findings noted above, significant  for *** I independently visualized and interpreted imaging. I agree with the radiologist interpretation   Medicines ordered and prescription drug management: No orders of the defined types were placed in this encounter.   -I have reviewed the patients home medicines and have made adjustments as needed   Consultations Obtained: I requested consultation with the ***,  and discussed lab and imaging findings as well as pertinent plan - they recommend: ***   Cardiac Monitoring: The patient was maintained on a cardiac monitor.  I personally viewed and interpreted the cardiac monitored which showed an underlying rhythm of: ***  Social Determinants of Health:  Diagnosis or treatment significantly limited by social determinants of health: {wssoc:28071}   Reevaluation: After the interventions noted above, I reevaluated the patient and found that they have {resolved/improved/worsened:23923::"improved"}  Co morbidities that complicate the patient evaluation  Past Medical History:  Diagnosis Date   Anxiety    Aortic atherosclerosis    Arthritis    Breast cancer 2013   LEFT breast with chemo and rad tx   Breast cancer 2017   right breast with rad tx   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 Chemotherapy/post wide excision whole breast radiation.   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    Personal history of  chemotherapy 2013   LEFT BREAST   Personal history of malignant neoplasm of breast    Personal history of radiation therapy 2017    Rt, 2013 had on left   Stroke 09/06/2020   1 cm acute infarction in the right parietal deep and subcortical white matter   Vertigo       Dispostion: Disposition decision including need for hospitalization was considered, and patient {wsdispo:28070::"discharged from emergency department."}    Final Clinical Impression(s) / ED Diagnoses Final diagnoses:  None     This chart was dictated using voice recognition software.  Despite best efforts to proofread,  errors can occur which can change the documentation meaning.

## 2022-11-28 NOTE — ED Notes (Signed)
Patient placed in a [place].

## 2022-11-28 NOTE — ED Notes (Signed)
Patient transported to CT 

## 2022-11-29 ENCOUNTER — Ambulatory Visit: Payer: Self-pay

## 2022-11-29 NOTE — Chronic Care Management (AMB) (Signed)
   11/29/2022  Leah Schaefer [birth year] [id number]   Reason for Encounter: Patient is not currently enrolled in the CCM program. CCM status changed to previously enrolled.   Katha Cabal RN Care Manager/Chronic Care Management [phone]

## 2022-12-06 ENCOUNTER — Telehealth: Payer: Self-pay

## 2022-12-06 NOTE — Telephone Encounter (Signed)
Transition Care Management Follow-up Telephone Call Date of discharge and from where: [name]  4/24 Patient declined call  How have you been since you were released from the hospital?  Any questions or concerns? No  Items Reviewed: Did the pt receive and understand the discharge instructions provided?    Medications obtained and verified?    Other?    Any new allergies since your discharge?    Dietary orders reviewed? No Do you have support at home?       Follow up appointments reviewed:  PCP Hospital f/u appt confirmed?     Scheduled to see  on  @ . Specialist Hospital f/u appt confirmed?     Scheduled to see  on  @ . Are transportation arrangements needed?    If their condition worsens, is the pt aware to call PCP or go to the Emergency Dept.?  Was the patient provided with contact information for the PCP's office or ED?  Was to pt encouraged to call back with questions or concerns?

## 2022-12-08 ENCOUNTER — Ambulatory Visit: Payer: Self-pay | Admitting: *Deleted

## 2022-12-08 ENCOUNTER — Ambulatory Visit (INDEPENDENT_AMBULATORY_CARE_PROVIDER_SITE_OTHER): Payer: Medicare HMO | Admitting: Family Medicine

## 2022-12-08 ENCOUNTER — Telehealth: Payer: Self-pay

## 2022-12-08 VITALS — BP 146/72 | HR 68 | Temp 98.1°F | Wt 170.0 lb

## 2022-12-08 DIAGNOSIS — K529 Noninfective gastroenteritis and colitis, unspecified: Secondary | ICD-10-CM

## 2022-12-08 DIAGNOSIS — R399 Unspecified symptoms and signs involving the genitourinary system: Secondary | ICD-10-CM

## 2022-12-08 DIAGNOSIS — R1110 Vomiting, unspecified: Secondary | ICD-10-CM

## 2022-12-08 MED ORDER — ONDANSETRON HCL 4 MG PO TABS: 4.0000 mg | ORAL_TABLET | Freq: Three times a day (TID) | ORAL | 0 refills | Status: DC | PRN

## 2022-12-08 MED ORDER — NITROFURANTOIN MONOHYD MACRO 100 MG PO CAPS
100.0000 mg | ORAL_CAPSULE | Freq: Two times a day (BID) | ORAL | 0 refills | Status: AC
Start: 2022-12-08 — End: 2022-12-13

## 2022-12-08 NOTE — Telephone Encounter (Signed)
  Chief Complaint: urinary symptoms Symptoms: odor, dark urine, abdominal pain, nausea Frequency: started yesterday Pertinent Negatives: Patient denies fever Disposition: [] ED /[] Urgent Care (no appt availability in office) / [x] Appointment(In office/virtual)/ []  [place] [facility]/ [] Home Care/ [] Refused Recommended Disposition /[] [place] [facility]/ []  Follow-up with PCP Additional Notes: Patient's daughter is calling with concerns patient may have UTI- patient is complaining of nausea, abdominal pain, odor/dark urine, frequency

## 2022-12-08 NOTE — Progress Notes (Signed)
Established patient visit   Patient: Leah Schaefer   DOB: [birth year]   82 y.o. Female  MRN: [id number] Visit Date: 12/08/2022  Today's healthcare provider: Sherlyn Hay, DO   No chief complaint on file.  Subjective    HPI  Urinary symptoms  She reports recurrent cloudy malodorous urine, flank pain, and urinary retention . The current episode started yesterday and is gradually worsening. Patient states symptoms are {severity:119268} in intensity, occurring  3 times a day. She  has not been recently treated for similar symptoms.    Associated symptoms: {Yes/No:20286} abdominal pain {Yes/No:20286} back pain  {Yes/No:20286} chills {Yes/No:20286} constipation  {Yes/No:20286} cramping {Yes/No:20286} diarrhea  {Yes/No:20286} discharge {Yes/No:20286} fever  {Yes/No:20286} hematuria {Yes/No:20286} nausea  {Yes/No:20286} vomiting     Diarrhea vomiting since yesterday afternoon.  At home UTI test showed a high level of leukocytes and was negative for nitrites. Urine dark golden/brown and malodorous. Vomited at 1420 before coming to clinic (banana/ham sandwich and her meds came up). Diffuse abdominal pain. Stool was very loose yesterday, did not have to run.  Just finished an antibiotic 3-4 weeks ago. Additional loose stool yesterday; three loose stools today.  Mid-feb - similar; was given doxycycline at urgent care 2/15, then cephalexin 2/17 at ER. UA at that time showed +1 WBC, no urinalysis.  Weak Has not fallen since symptoms yesterday.  Nitro, cipro or leva, tobramycin, bactrim, otherwise IV ---------------------------------------------------------------------------------------   Medications: Outpatient Medications Prior to Visit  Medication Sig  . ACCU-CHEK AVIVA PLUS test strip CHECK  FASTING  BLOOD  GLUCOSE EVERY DAY  . Accu-Chek Softclix Lancets lancets TEST BLOOD SUGAR EVERY DAY  . acetaminophen (TYLENOL) 500 MG tablet Take 500 mg by mouth every 6 (six) hours as  needed for mild pain or headache.   Marland Kitchen atorvastatin (LIPITOR) 40 MG tablet TAKE 1 TABLET EVERY EVENING  . buPROPion (WELLBUTRIN XL) 300 MG 24 hr tablet Take 1 tablet (300 mg total) by mouth daily.  . Calcium Carb-Cholecalciferol 600-800 MG-UNIT TABS Take 1 tablet by mouth daily.  . cholecalciferol (VITAMIN D) 1000 units tablet Take 1,000 Units by mouth daily.  . clopidogrel (PLAVIX) 75 MG tablet TAKE 1 TABLET EVERY DAY  . cyanocobalamin 500 MCG tablet Take 500 mcg by mouth daily.  Marland Kitchen donepezil (ARICEPT) 5 MG tablet Take 5 mg by mouth at bedtime.  Marland Kitchen escitalopram (LEXAPRO) 20 MG tablet Take 20 mg by mouth daily.  . fluticasone (FLONASE) 50 MCG/ACT nasal spray Place 2 sprays into both nostrils daily.  Marland Kitchen gabapentin (NEURONTIN) 100 MG capsule Take 100 mg by mouth 3 (three) times daily.  Marland Kitchen latanoprost (XALATAN) 0.005 % ophthalmic solution Place 1 drop into both eyes at bedtime.   Marland Kitchen losartan (COZAAR) 50 MG tablet TAKE 1 TABLET EVERY DAY  . metFORMIN (GLUCOPHAGE) 1000 MG tablet TAKE 1 TABLET ONCE DAILY WITH A MEAL  . metoprolol tartrate (LOPRESSOR) 25 MG tablet Take 0.5 tablets (12.5 mg total) by mouth 2 (two) times daily.  . Multiple Vitamin (MULTIVITAMIN WITH MINERALS) TABS tablet Take 1 tablet by mouth daily.  . pantoprazole (PROTONIX) 40 MG tablet Take 1 tablet (40 mg total) by mouth daily.  Marland Kitchen pyridoxine (B-6) 100 MG tablet Take 100 mg by mouth daily.  . sertraline (ZOLOFT) 100 MG tablet Take 0.5 tablets (50 mg total) by mouth daily.  . vitamin C (ASCORBIC ACID) 500 MG tablet Take 500 mg by mouth daily.  . vitamin E 400 UNIT capsule Take 400 Units by  mouth daily.  . meclizine (ANTIVERT) 25 MG tablet Take 0.5 tablets (12.5 mg total) by mouth 3 (three) times daily as needed for dizziness. (Patient not taking: Reported on 12/08/2022)  . ondansetron (ZOFRAN-ODT) 4 MG disintegrating tablet Take 1 tablet (4 mg total) by mouth every 8 (eight) hours as needed. (Patient not taking: Reported on 12/08/2022)   No  facility-administered medications prior to visit.    Review of Systems  {Labs  Heme  Chem  Endocrine  Serology  Results Review (optional):23779}   Objective    BP (!) 159/86 (BP Location: Right Arm, Patient Position: Sitting, Cuff Size: Normal)   Pulse 68   Temp 98.1 F (36.7 C) (Oral)   Wt 170 lb (77.1 kg)   SpO2 100%   BMI 27.44 kg/m  {Show previous vital signs (optional):23777}  Physical Exam  ***  No results found for any visits on 12/08/22.  Assessment & Plan     ***  No follow-ups on file.      {provider attestation***:1}   Sherlyn Hay, DO  [facility] [phone] (phone) [phone] (fax)  [facility]

## 2022-12-08 NOTE — Telephone Encounter (Signed)
Summary: Possible UTI   Pt's daughter called to report that the patient potentially has a UTI, wants pt seen today. Please advise  Best contact: [phone]       Reason for Disposition  Bad or foul-smelling urine  Answer Assessment - Initial Assessment Questions 1. SYMPTOM: "What's the main symptom you're concerned about?" (e.g., frequency, incontinence)     Frequency, nausea- vomiting, abdominal pain, odor/dark urine 2. ONSET: "When did the  symptoms  start?"     yesterday 3. PAIN: "Is there any pain?" If Yes, ask: "How bad is it?" (Scale: 1-10; mild, moderate, severe)     no 4. CAUSE: "What do you think is causing the symptoms?"     UTI 5. OTHER SYMPTOMS: "Do you have any other symptoms?" (e.g., blood in urine, fever, flank pain, pain with urination)     Nausea, abdominal discomfort  Protocols used: Urinary Symptoms-A-AH

## 2022-12-08 NOTE — Progress Notes (Cosign Needed)
Care Coordination Pharmacy Assistant   Name: Leah Schaefer  MRN: [id number] DOB: May 30, 1941  Reason for Encounter: Appointment Reminder Call   I called the patient to remind her of the telephone appointment with CPP on Monday and her daughter answered and advised that she would like to reschedule. Per the daughter the patient had a severe fall and will be following-up with PCP on Monday. The daughter advised the patient hit the back of her head and encountered a concussion.   Per the daughter the patient is okay, but has been having a hard time since the fall. The patient's daughter advised the patient is upset that she has to leave the home to have the appointment on Monday and 2 appointments might over whelm her. I advised the daughter no worries since she will be seeing PCP we can reschedule to next month. The daughter advised the patient is okay, but she is healing.  Patient rescheduled to 01/08/2023 at 1500.  Medications: Outpatient Encounter Medications as of 12/08/2022  Medication Sig   ACCU-CHEK AVIVA PLUS test strip CHECK  FASTING  BLOOD  GLUCOSE EVERY DAY   Accu-Chek Softclix Lancets lancets TEST BLOOD SUGAR EVERY DAY   acetaminophen (TYLENOL) 500 MG tablet Take 500 mg by mouth every 6 (six) hours as needed for mild pain or headache.    atorvastatin (LIPITOR) 40 MG tablet TAKE 1 TABLET EVERY EVENING   buPROPion (WELLBUTRIN XL) 300 MG 24 hr tablet Take 1 tablet (300 mg total) by mouth daily.   Calcium Carb-Cholecalciferol 600-800 MG-UNIT TABS Take 1 tablet by mouth daily.   cholecalciferol (VITAMIN D) 1000 units tablet Take 1,000 Units by mouth daily.   clopidogrel (PLAVIX) 75 MG tablet TAKE 1 TABLET EVERY DAY   cyanocobalamin 500 MCG tablet Take 500 mcg by mouth daily.   fluticasone (FLONASE) 50 MCG/ACT nasal spray Place 2 sprays into both nostrils daily.   latanoprost (XALATAN) 0.005 % ophthalmic solution Place 1 drop into both eyes at bedtime.    losartan (COZAAR) 50 MG tablet  TAKE 1 TABLET EVERY DAY   meclizine (ANTIVERT) 25 MG tablet Take 0.5 tablets (12.5 mg total) by mouth 3 (three) times daily as needed for dizziness.   metFORMIN (GLUCOPHAGE) 1000 MG tablet TAKE 1 TABLET ONCE DAILY WITH A MEAL   metoprolol tartrate (LOPRESSOR) 25 MG tablet Take 0.5 tablets (12.5 mg total) by mouth 2 (two) times daily.   Multiple Vitamin (MULTIVITAMIN WITH MINERALS) TABS tablet Take 1 tablet by mouth daily.   ondansetron (ZOFRAN-ODT) 4 MG disintegrating tablet Take 1 tablet (4 mg total) by mouth every 8 (eight) hours as needed.   pantoprazole (PROTONIX) 40 MG tablet Take 1 tablet (40 mg total) by mouth daily.   pyridoxine (B-6) 100 MG tablet Take 100 mg by mouth daily.   sertraline (ZOLOFT) 100 MG tablet Take 0.5 tablets (50 mg total) by mouth daily.   vitamin C (ASCORBIC ACID) 500 MG tablet Take 500 mg by mouth daily.   vitamin E 400 UNIT capsule Take 400 Units by mouth daily.   No facility-administered encounter medications on file as of 12/08/2022.    Adelene Idler, CPA/CMA Clinical Pharmacist Assistant Phone: [phone]

## 2022-12-09 ENCOUNTER — Encounter: Payer: Self-pay | Admitting: Family Medicine

## 2022-12-11 ENCOUNTER — Encounter: Payer: Self-pay | Admitting: Family Medicine

## 2022-12-11 ENCOUNTER — Ambulatory Visit (INDEPENDENT_AMBULATORY_CARE_PROVIDER_SITE_OTHER): Payer: Medicare HMO | Admitting: Family Medicine

## 2022-12-11 VITALS — BP 147/88 | HR 70 | Temp 97.8°F | Resp 96 | Wt 170.0 lb

## 2022-12-11 DIAGNOSIS — R399 Unspecified symptoms and signs involving the genitourinary system: Secondary | ICD-10-CM

## 2022-12-11 DIAGNOSIS — F339 Major depressive disorder, recurrent, unspecified: Secondary | ICD-10-CM

## 2022-12-11 DIAGNOSIS — R55 Syncope and collapse: Secondary | ICD-10-CM

## 2022-12-11 DIAGNOSIS — R42 Dizziness and giddiness: Secondary | ICD-10-CM | POA: Diagnosis not present

## 2022-12-11 MED ORDER — SERTRALINE HCL 100 MG PO TABS: 50.0000 mg | ORAL_TABLET | Freq: Every day | ORAL | 1 refills | Status: DC

## 2022-12-11 MED ORDER — BUPROPION HCL ER (XL) 300 MG PO TB24: 300.0000 mg | ORAL_TABLET | Freq: Every day | ORAL | 1 refills | Status: DC

## 2022-12-11 NOTE — Patient Instructions (Addendum)
I have sent in refills for your Zoloft and Wellbutrin to take daily to help with depression symptoms   I recommend using [email or web address] to find a therapist that will work best for your schedule as medications work best when also completing talk therapy.   Please follow up with me in one month and continue your medications as prescribed

## 2022-12-11 NOTE — Assessment & Plan Note (Signed)
Chronic Patient encouraged to continue to ambulate with walker  She will continue to follow up with neurology as scheduled  Offered [facility] PT, they would like to work on improving patient's mood prior to setting up additional therapy

## 2022-12-11 NOTE — Progress Notes (Signed)
Established patient visit   Patient: Leah Schaefer   DOB: [birth year]   82 y.o. Female  MRN: [id number] Visit Date: 12/11/2022  Today's healthcare provider: [name], MD   No chief complaint on file.  Subjective    HPI   Hospital Follow up  Leah Schaefer is a 82 y.o. female with hx notable for T2DM,HTN,breast CA, MDD &  CVA presenting for hospital follow up for syncope related to fall. Patient was also evaluated in the clinic three days ago for UTI symptoms and treated with nitrofurantoin 100mg  BID for 5 days.     Hx obtained partially from patient and partially from chart review:   Patient was evaluated in the ED  on 11/28/22 and discharged on 11/28/22 after diagnosis of syncope with LOC resulting in fall.   Disposition was to home    Notable events & treatment during hospitalization include:  -patient underwent head and cervical spine CT imaging that showed no acute intracranial abnormalities, cerebral atrophy, microvascular disease and multilevel degenerative disease  -labs were notable for troponin change from 8 to 7,  GFR of 58 and elevated glucose   Medication Changes at discharge:  -started on macrobid 12/08/22 for UTI   Other Follow Up recommended after hospital D/C:  - patient will follow up as scheduled with cardiology on 01/16/23   Anxiety  depression  Family is concerned that there are 3 antidepressants on the patient's list.  Bupropion, Sertraline and Lexapro.  She is taking Bupropion but does not have the others.  She only started back on the Bupropion last week because she had not been taking her medications properly. Patient is frequently tearful on exam and states that she often feels apathetic, feels sad most days because her activities are limited due to her health and physical condition, she would like to take something to help with her nerves Patient voices understanding about need to discuss her emotions and concerns with someone     Dizziness She is out of Meclizine and would like to have refill. Daughter states the patient does not like to use her walker, patient states she often forgets the walker, they report that the patient fell in April because she did not wait for assistance with getting up the stairs like she usually does  Frequent UTI: Patient was seen on Friday for UTI but was unable to provide a urine specimen for culture.  They have brought one today from this morning.   Medications: Outpatient Medications Prior to Visit  Medication Sig   ACCU-CHEK AVIVA PLUS test strip CHECK  FASTING  BLOOD  GLUCOSE EVERY DAY   Accu-Chek Softclix Lancets lancets TEST BLOOD SUGAR EVERY DAY   acetaminophen (TYLENOL) 500 MG tablet Take 500 mg by mouth every 6 (six) hours as needed for mild pain or headache.    atorvastatin (LIPITOR) 40 MG tablet TAKE 1 TABLET EVERY EVENING   Calcium Carb-Cholecalciferol 600-800 MG-UNIT TABS Take 1 tablet by mouth daily.   cholecalciferol (VITAMIN D) 1000 units tablet Take 1,000 Units by mouth daily.   clopidogrel (PLAVIX) 75 MG tablet TAKE 1 TABLET EVERY DAY   cyanocobalamin 500 MCG tablet Take 500 mcg by mouth daily.   donepezil (ARICEPT) 5 MG tablet Take 5 mg by mouth at bedtime.   fluticasone (FLONASE) 50 MCG/ACT nasal spray Place 2 sprays into both nostrils daily.   gabapentin (NEURONTIN) 100 MG capsule Take 100 mg by mouth 3 (three) times daily.   latanoprost (XALATAN)  0.005 % ophthalmic solution Place 1 drop into both eyes at bedtime.    losartan (COZAAR) 50 MG tablet TAKE 1 TABLET EVERY DAY   meclizine (ANTIVERT) 25 MG tablet Take 0.5 tablets (12.5 mg total) by mouth 3 (three) times daily as needed for dizziness. (Patient not taking: Reported on 12/08/2022)   metFORMIN (GLUCOPHAGE) 1000 MG tablet TAKE 1 TABLET ONCE DAILY WITH A MEAL   metoprolol tartrate (LOPRESSOR) 25 MG tablet Take 0.5 tablets (12.5 mg total) by mouth 2 (two) times daily.   Multiple Vitamin (MULTIVITAMIN WITH  MINERALS) TABS tablet Take 1 tablet by mouth daily.   nitrofurantoin, macrocrystal-monohydrate, (MACROBID) 100 MG capsule Take 1 capsule (100 mg total) by mouth 2 (two) times daily for 5 days.   ondansetron (ZOFRAN) 4 MG tablet Take 1 tablet (4 mg total) by mouth every 8 (eight) hours as needed for nausea or vomiting.   pantoprazole (PROTONIX) 40 MG tablet Take 1 tablet (40 mg total) by mouth daily.   pyridoxine (B-6) 100 MG tablet Take 100 mg by mouth daily.   vitamin C (ASCORBIC ACID) 500 MG tablet Take 500 mg by mouth daily.   vitamin E 400 UNIT capsule Take 400 Units by mouth daily.   [DISCONTINUED] buPROPion (WELLBUTRIN XL) 300 MG 24 hr tablet Take 1 tablet (300 mg total) by mouth daily.   [DISCONTINUED] escitalopram (LEXAPRO) 20 MG tablet Take 20 mg by mouth daily.   [DISCONTINUED] sertraline (ZOLOFT) 100 MG tablet Take 0.5 tablets (50 mg total) by mouth daily.   No facility-administered medications prior to visit.    Review of Systems  Musculoskeletal:  Positive for back pain.  Neurological:  Negative for headaches.       Objective    BP (!) 147/88   Pulse 70   Temp 97.8 F (36.6 C) (Oral)   Resp (!) 96   Wt 170 lb (77.1 kg)   BMI 27.44 kg/m    Physical Exam Vitals reviewed.  Constitutional:      General: She is not in acute distress.    Appearance: Normal appearance. She is not ill-appearing, toxic-appearing or diaphoretic.     Comments: Elderly female with walker seated in exam room   Cardiovascular:     Rate and Rhythm: Normal rate and regular rhythm.     Pulses: Normal pulses.     Heart sounds: Normal heart sounds. No murmur heard.    No friction rub. No gallop.  Pulmonary:     Effort: Pulmonary effort is normal. No respiratory distress.     Breath sounds: Normal breath sounds. No stridor. No wheezing, rhonchi or rales.  Musculoskeletal:     Right lower leg: No edema.     Left lower leg: No edema.  Neurological:     Mental Status: She is alert and oriented  to person, place, and time.  Psychiatric:        Attention and Perception: Attention normal.        Mood and Affect: Mood is depressed. Affect is tearful.        Speech: Speech normal.        Behavior: Behavior is withdrawn. Behavior is not slowed.      No results found for any visits on 12/11/22.  Assessment & Plan     Problem List Items Addressed This Visit       Cardiovascular and Mediastinum   Postural dizziness with presyncope    Chronic Patient encouraged to continue to ambulate with walker  She will  continue to follow up with neurology as scheduled  Offered [facility] PT, they would like to work on improving patient's mood prior to setting up additional therapy          Other   Episode of recurrent major depressive disorder (HCC)    Chronic  Worsened symptoms  Recommended that patient establish with therapist for CBT and talk therapy  Will have patient restart Zoloft 50mg  and Wellbutrin 300mg  daily  Will follow up in 1 month       Relevant Medications   buPROPion (WELLBUTRIN XL) 300 MG 24 hr tablet   sertraline (ZOLOFT) 100 MG tablet   Urinary symptom or sign - Primary    Patient reports improved symptoms  Will plan to obtain urine sample and culture at follow up in one month  Patient may benefit from urology referral for frequent UTI if symptoms reoccur          Return in about 1 month (around 01/11/2023) for mood .       The entirety of the information documented in the History of Present Illness, Review of Systems and Physical Exam were personally obtained by me. Portions of this information were initially documented by Adline Peals, CMA. I, [name], MD have reviewed the documentation above for thoroughness and accuracy.   [name], MD  [facility] [phone] (phone) [phone] (fax)  [facility]

## 2022-12-11 NOTE — Assessment & Plan Note (Signed)
Chronic  Worsened symptoms  Recommended that patient establish with therapist for CBT and talk therapy  Will have patient restart Zoloft 50mg  and Wellbutrin 300mg  daily  Will follow up in 1 month

## 2022-12-11 NOTE — Assessment & Plan Note (Signed)
Patient reports improved symptoms  Will plan to obtain urine sample and culture at follow up in one month  Patient may benefit from urology referral for frequent UTI if symptoms reoccur

## 2022-12-14 ENCOUNTER — Telehealth: Payer: Self-pay | Admitting: Family Medicine

## 2022-12-14 ENCOUNTER — Encounter: Payer: Self-pay | Admitting: Intensive Care

## 2022-12-14 ENCOUNTER — Emergency Department: Payer: Medicare HMO

## 2022-12-14 ENCOUNTER — Other Ambulatory Visit: Payer: Self-pay

## 2022-12-14 DIAGNOSIS — E119 Type 2 diabetes mellitus without complications: Secondary | ICD-10-CM | POA: Insufficient documentation

## 2022-12-14 DIAGNOSIS — F0390 Unspecified dementia without behavioral disturbance: Secondary | ICD-10-CM | POA: Insufficient documentation

## 2022-12-14 DIAGNOSIS — R1013 Epigastric pain: Secondary | ICD-10-CM | POA: Insufficient documentation

## 2022-12-14 DIAGNOSIS — Z20822 Contact with and (suspected) exposure to covid-19: Secondary | ICD-10-CM | POA: Insufficient documentation

## 2022-12-14 DIAGNOSIS — R109 Unspecified abdominal pain: Secondary | ICD-10-CM | POA: Diagnosis not present

## 2022-12-14 DIAGNOSIS — R112 Nausea with vomiting, unspecified: Secondary | ICD-10-CM | POA: Insufficient documentation

## 2022-12-14 DIAGNOSIS — I700 Atherosclerosis of aorta: Secondary | ICD-10-CM | POA: Diagnosis not present

## 2022-12-14 DIAGNOSIS — I10 Essential (primary) hypertension: Secondary | ICD-10-CM | POA: Diagnosis not present

## 2022-12-14 DIAGNOSIS — R9431 Abnormal electrocardiogram [ECG] [EKG]: Secondary | ICD-10-CM | POA: Diagnosis not present

## 2022-12-14 DIAGNOSIS — K3184 Gastroparesis: Secondary | ICD-10-CM | POA: Diagnosis not present

## 2022-12-14 DIAGNOSIS — R1110 Vomiting, unspecified: Secondary | ICD-10-CM | POA: Diagnosis not present

## 2022-12-14 LAB — CBC WITH DIFFERENTIAL/PLATELET
Abs Immature Granulocytes: 0.02 10*3/uL (ref 0.00–0.07)
Basophils Absolute: 0.1 10*3/uL (ref 0.0–0.1)
Basophils Relative: 1 %
Eosinophils Absolute: 0.1 10*3/uL (ref 0.0–0.5)
Eosinophils Relative: 2 %
HCT: 44.6 % (ref 36.0–46.0)
Hemoglobin: 14.7 g/dL (ref 12.0–15.0)
Immature Granulocytes: 0 %
Lymphocytes Relative: 21 %
Lymphs Abs: 1.2 10*3/uL (ref 0.7–4.0)
MCH: 29.9 pg (ref 26.0–34.0)
MCHC: 33 g/dL (ref 30.0–36.0)
MCV: 90.8 fL (ref 80.0–100.0)
Monocytes Absolute: 0.3 10*3/uL (ref 0.1–1.0)
Monocytes Relative: 6 %
Neutro Abs: 4 10*3/uL (ref 1.7–7.7)
Neutrophils Relative %: 70 %
Platelets: 244 10*3/uL (ref 150–400)
RBC: 4.91 MIL/uL (ref 3.87–5.11)
RDW: 12.3 % (ref 11.5–15.5)
WBC: 5.7 10*3/uL (ref 4.0–10.5)
nRBC: 0 % (ref 0.0–0.2)

## 2022-12-14 LAB — RESP PANEL BY RT-PCR (RSV, FLU A&B, COVID)  RVPGX2
Influenza A by PCR: NEGATIVE
Influenza B by PCR: NEGATIVE
Resp Syncytial Virus by PCR: NEGATIVE
SARS Coronavirus 2 by RT PCR: NEGATIVE

## 2022-12-14 LAB — COMPREHENSIVE METABOLIC PANEL
ALT: 15 U/L (ref 0–44)
AST: 20 U/L (ref 15–41)
Albumin: 4.6 g/dL (ref 3.5–5.0)
Alkaline Phosphatase: 64 U/L (ref 38–126)
Anion gap: 11 (ref 5–15)
BUN: 11 mg/dL (ref 8–23)
CO2: 29 mmol/L (ref 22–32)
Calcium: 9.8 mg/dL (ref 8.9–10.3)
Chloride: 99 mmol/L (ref 98–111)
Creatinine, Ser: 0.96 mg/dL (ref 0.44–1.00)
GFR, Estimated: 60 mL/min — ABNORMAL LOW (ref >60)
Glucose, Bld: 133 mg/dL — ABNORMAL HIGH (ref 70–99)
Potassium: 3.7 mmol/L (ref 3.5–5.1)
Sodium: 139 mmol/L (ref 135–145)
Total Bilirubin: 0.8 mg/dL (ref 0.3–1.2)
Total Protein: 7.6 g/dL (ref 6.5–8.1)

## 2022-12-14 LAB — LIPASE, BLOOD: Lipase: 40 U/L (ref 11–51)

## 2022-12-14 MED ORDER — IOHEXOL 300 MG/ML  SOLN
100.0000 mL | Freq: Once | INTRAMUSCULAR | Status: AC | PRN
  Administered 2022-12-14: 100 mL via INTRAVENOUS

## 2022-12-14 NOTE — ED Provider Triage Note (Signed)
Emergency Medicine Provider Triage Evaluation Note  Leah Schaefer , a 82 y.o. female  was evaluated in triage.  Pt complains of abd pain, N/V, diarrhea. No bilious emesis. Patient had similar symptoms in February with Pyelonephritis. PAtient does have a HX of recurrent UTIs. .  Review of Systems  Positive: Abd pain, N/V, diarrhea Negative: Fever, constipation, CP  Physical Exam  BP (!) 151/84 (BP Location: Left Arm)   Pulse 69   Temp 98.3 F (36.8 C) (Oral)   Resp 16   Ht 5\' 6"  (1.676 m)   Wt 71.7 kg   SpO2 95%   BMI 25.50 kg/m  Gen:   Awake, no distress   Resp:  Normal effort  MSK:   Moves extremities without difficulty  Other:    Medical Decision Making  Medically screening exam initiated at 6:45 PM.  Appropriate orders placed.  AUBREN VACCARELLO was informed that the remainder of the evaluation will be completed by another provider, this initial triage assessment does not replace that evaluation, and the importance of remaining in the ED until their evaluation is complete.  Labs, urinalysis with culture, CT abd/pel   Racheal Patches, PA-C 12/14/22 1845

## 2022-12-14 NOTE — ED Triage Notes (Signed)
First nurse note: Patient c/o N/V X1 month. Has lost 20lb in 1 month and 12lb in last week per patient. MD at [facility] is concerned for bowel obstruction. Unable to keep any foods down.   EMS vitals: 146/65b/p 74P 94%RA 98.3oral

## 2022-12-15 ENCOUNTER — Emergency Department
Admission: EM | Admit: 2022-12-15 | Discharge: 2022-12-15 | Disposition: A | Discharge status: Home / Self Care | Payer: Medicare HMO | Attending: Emergency Medicine | Admitting: Emergency Medicine

## 2022-12-15 DIAGNOSIS — R112 Nausea with vomiting, unspecified: Secondary | ICD-10-CM

## 2022-12-15 DIAGNOSIS — R1013 Epigastric pain: Secondary | ICD-10-CM

## 2022-12-15 MED ORDER — ONDANSETRON 8 MG PO TBDP: 8.0000 mg | ORAL_TABLET | Freq: Three times a day (TID) | ORAL | 0 refills | Status: DC | PRN

## 2022-12-15 MED ORDER — METOCLOPRAMIDE HCL 5 MG/ML IJ SOLN
20.0000 mg | Freq: Once | INTRAVENOUS | Status: AC
  Administered 2022-12-15: 20 mg via INTRAVENOUS
  Filled 2022-12-15 (×2): qty 4

## 2022-12-15 MED ORDER — METOCLOPRAMIDE HCL 10 MG PO TABS: 10.0000 mg | ORAL_TABLET | Freq: Three times a day (TID) | ORAL | 1 refills | Status: DC

## 2022-12-15 MED ORDER — SODIUM CHLORIDE 0.9 % IV BOLUS
1000.0000 mL | Freq: Once | INTRAVENOUS | Status: AC
  Administered 2022-12-15: 1000 mL via INTRAVENOUS

## 2022-12-15 NOTE — ED Notes (Signed)
Patient tolerated PO challenge. Denies nausea.

## 2022-12-15 NOTE — ED Notes (Signed)
Patient discharged at this time. Ambulates with independent and steady gait. Wheeled to lobby per request. Breathing unlabored speaking in full sentences. Verbalized understanding of all discharge, follow up, and medication teaching. Discharged homed with all belongings.

## 2022-12-15 NOTE — ED Notes (Signed)
0330- patient provided with ginger ale and saltine crackers for PO challenge per MD request.

## 2022-12-15 NOTE — ED Provider Notes (Addendum)
[facility] Provider Note   Event Date/Time   First MD Initiated Contact with Patient 12/15/22 0036     (approximate) History  Abdominal Pain and Emesis  HPI Leah Schaefer is a 82 y.o. female with a past medical history of dementia, anxiety/depression, type 2 diabetes, hypertension, and hypercholesterolemia who presents for 2 weeks of p.o. intolerance.  Patient and patient's daughter who is her caregiver at bedside provides this history stating that patient has been intolerant of solid foods over the last few weeks.  Patient has been tolerating liquids and states that solid foods always cause epigastric pain with variable x 2 ingestion versus regurgitation.  Daughter states that food is often regurgitated 1 to 2 hours later undigested and with medications that were taken during the meal. ROS: Patient currently denies any vision changes, tinnitus, difficulty speaking, facial droop, sore throat, chest pain, shortness of breath, diarrhea, dysuria, or weakness/numbness/paresthesias in any extremity   Physical Exam  Triage Vital Signs: ED Triage Vitals  Enc Vitals Group     BP 12/14/22 1837 (!) 151/84     Pulse Rate 12/14/22 1837 69     Resp 12/14/22 1837 16     Temp 12/14/22 1839 98.3 F (36.8 C)     Temp Source 12/14/22 1839 Oral     SpO2 12/14/22 1837 95 %     Weight 12/14/22 1837 158 lb (71.7 kg)     Height 12/14/22 1837 5\' 6"  (1.676 m)     Head Circumference --      Peak Flow --      Pain Score 12/14/22 1837 0     Pain Loc --      Pain Edu? --      Excl. in GC? --    Most recent vital signs: Vitals:   12/15/22 0008 12/15/22 0216  BP: (!) 140/78 134/86  Pulse: 76 80  Resp: 16 18  Temp: 98.3 F (36.8 C) 98.4 F (36.9 C)  SpO2: 97% 96%   General: Awake, oriented x4. CV:  Good peripheral perfusion.  Resp:  Normal effort.  Abd:  No distention.  Other:  Elderly overweight African-American female laying in bed in no acute distress ED Results /  Procedures / Treatments  Labs (all labs ordered are listed, but only abnormal results are displayed) Labs Reviewed  COMPREHENSIVE METABOLIC PANEL - Abnormal; Notable for the following components:      Result Value   Glucose, Bld 133 (*)    GFR, Estimated 60 (*)    All other components within normal limits  RESP PANEL BY RT-PCR (RSV, FLU A&B, COVID)  RVPGX2  CBC WITH DIFFERENTIAL/PLATELET  LIPASE, BLOOD  RADIOLOGY ED MD interpretation: ET of the abdomen and pelvis with IV contrast shows a small hiatal hernia, fatty liver, and no acute abnormality noted.  This imaging was independently interpreted by me -Agree with radiology assessment Official radiology report(s): CT ABDOMEN PELVIS W CONTRAST  Result Date: 12/14/2022 CLINICAL DATA:  Acute abdominal pain with nausea and vomiting for 1 month. Unintended weight loss EXAM: CT ABDOMEN AND PELVIS WITH CONTRAST TECHNIQUE: Multidetector CT imaging of the abdomen and pelvis was performed using the standard protocol following bolus administration of intravenous contrast. RADIATION DOSE REDUCTION: This exam was performed according to the departmental dose-optimization program which includes automated exposure control, adjustment of the mA and/or kV according to patient size and/or use of iterative reconstruction technique. CONTRAST:  OMNIPAQUE IOHEXOL 300 MG/ML  SOLN COMPARISON:  09/23/2022  FINDINGS: Lower chest: Lung bases are free of acute infiltrate or sizable effusion. Mild scarring is noted in the medial right lung base. Sliding-type hiatal hernia is noted. Hepatobiliary: Fatty infiltration of the liver is noted. The gallbladder has been surgically removed. Pancreas: Unremarkable. No pancreatic ductal dilatation or surrounding inflammatory changes. Spleen: Normal in size without focal abnormality. Adrenals/Urinary Tract: Adrenal glands are within normal limits. Kidneys demonstrate a normal enhancement pattern bilaterally. Normal excretion is noted on  delayed images. No renal calculi or obstructive changes are seen. Bladder is decompressed. Stomach/Bowel: No obstructive or inflammatory changes of colon are noted. The appendix is within normal limits. Small bowel and stomach are unremarkable aside from the previously described hiatal hernia. Vascular/Lymphatic: Aortic atherosclerosis. No enlarged abdominal or pelvic lymph nodes. Reproductive: Uterus and bilateral adnexa are unremarkable. Other: No abdominal wall hernia or abnormality. No abdominopelvic ascites. Musculoskeletal: Degenerative changes of the lumbar spine are noted. IMPRESSION: Small hiatal hernia. Fatty liver. No acute abnormality noted. Electronically Signed   By: Alcide Clever M.D.   On: 12/14/2022 22:58   PROCEDURES: Critical Care performed: No .1-3 Lead EKG Interpretation  Performed by: Merwyn Katos, MD Authorized by: Merwyn Katos, MD     Interpretation: normal     ECG rate:  71   ECG rate assessment: normal     Rhythm: sinus rhythm     Ectopy: none     Conduction: normal    MEDICATIONS ORDERED IN ED: Medications  iohexol (OMNIPAQUE) 300 MG/ML solution 100 mL (100 mLs Intravenous Contrast Given 12/14/22 2235)  metoCLOPramide (REGLAN) 20 mg in dextrose 5 % 50 mL IVPB (0 mg Intravenous Stopped 12/15/22 0154)  sodium chloride 0.9 % bolus 1,000 mL (0 mLs Intravenous Stopped 12/15/22 0403)   IMPRESSION / MDM / ASSESSMENT AND PLAN / ED COURSE  I reviewed the triage vital signs and the nursing notes.                             The patient is on the cardiac monitor to evaluate for evidence of arrhythmia and/or significant heart rate changes. Patient's presentation is most consistent with acute presentation with potential threat to life or bodily function. Patient presents for acute nausea/vomiting The cause of the patients symptoms is not clear, but the patient is overall well appearing and shows no signs of severe malnutrition.  Given History and Exam there does not  appear to be an emergent cause of the symptoms such as small bowel obstruction, coronary syndrome, bowel ischemia, DKA, pancreatitis, appendicitis, other acute abdomen or other emergent problem.  Given patient's history and presenting symptoms, I am concerned for possible gastroparesis in this poorly controlled diabetic and encouraged her to follow-up with her primary care physician for further evaluation and management.  In the meantime, I will prescribe patient Reglan and as needed Zofran.  Reassessment: After treatment, the patient is feeling much better, tolerating PO fluids, and shows no signs of dehydration.   Disposition: Discharge home with prompt primary care physician follow up in the next 48 hours. Strict return precautions discussed.   FINAL CLINICAL IMPRESSION(S) / ED DIAGNOSES   Final diagnoses:  Nausea and vomiting, unspecified vomiting type  Epigastric pain   Rx / DC Orders   ED Discharge Orders          Ordered    metoCLOPramide (REGLAN) 10 MG tablet  3 times daily with meals  12/15/22 0413    ondansetron (ZOFRAN-ODT) 8 MG disintegrating tablet  Every 8 hours PRN        12/15/22 0413           Note:  This document was prepared using Dragon voice recognition software and may include unintentional dictation errors.   Merwyn Katos, MD 12/15/22 1610    Merwyn Katos, MD 12/15/22 [phone]

## 2022-12-19 ENCOUNTER — Telehealth: Payer: Self-pay

## 2022-12-19 DIAGNOSIS — K449 Diaphragmatic hernia without obstruction or gangrene: Secondary | ICD-10-CM

## 2022-12-19 NOTE — Telephone Encounter (Signed)
Referral for GI submitted   Please have coordinator contact patient's daughter for scheduling

## 2022-12-19 NOTE — Telephone Encounter (Signed)
Copied from CRM [id number]. Topic: Referral - Question >> Dec 19, 2022  4:11 PM Haroldine Laws wrote: Reason for CRM: pt needs referral for GI.  She went to the ER and has a Hiatal Hernia.  CB#  [phone]

## 2022-12-20 NOTE — Telephone Encounter (Signed)
Sent referral coordinator a message on this.

## 2022-12-22 ENCOUNTER — Ambulatory Visit: Payer: Medicare HMO | Admitting: Family Medicine

## 2022-12-25 ENCOUNTER — Telehealth: Payer: Self-pay

## 2022-12-25 NOTE — Telephone Encounter (Signed)
Transition Care Management Unsuccessful Follow-up Telephone Call  Date of discharge and from where:  12/15/2022 [facility]  Attempts:  1st Attempt  Reason for unsuccessful TCM follow-up call:  No answer/busy  [facility]  [facility] Population Health Community Resource Care Guide   ??[email or web address]  ?? [phone]   Website: [email or web address]

## 2022-12-26 ENCOUNTER — Telehealth: Payer: Self-pay

## 2022-12-26 ENCOUNTER — Other Ambulatory Visit: Payer: Self-pay | Admitting: Family Medicine

## 2022-12-26 DIAGNOSIS — I10 Essential (primary) hypertension: Secondary | ICD-10-CM

## 2022-12-26 NOTE — Telephone Encounter (Signed)
Transition Care Management Unsuccessful Follow-up Telephone Call  Date of discharge and from where:  12/15/2022 [facility]  Attempts:  2nd Attempt  Reason for unsuccessful TCM follow-up call:  No answer/busy  [facility]  [facility] Population Health Community Resource Care Guide   [email or web address]  ?? [phone]   Website: [email or web address]

## 2022-12-27 ENCOUNTER — Other Ambulatory Visit: Payer: Self-pay | Admitting: Family Medicine

## 2022-12-27 DIAGNOSIS — I10 Essential (primary) hypertension: Secondary | ICD-10-CM

## 2022-12-27 MED ORDER — ATORVASTATIN CALCIUM 40 MG PO TABS
40.0000 mg | ORAL_TABLET | Freq: Every evening | ORAL | 1 refills | Status: DC
Start: 2022-12-27 — End: 2023-06-07

## 2022-12-27 NOTE — Telephone Encounter (Signed)
Requested Prescriptions  Pending Prescriptions Disp Refills   metoprolol tartrate (LOPRESSOR) 25 MG tablet [Pharmacy Med Name: METOPROLOL TARTRATE 25 MG Tablet] 90 tablet 0    Sig: TAKE 1/2 TABLET TWICE DAILY     Cardiovascular:  Beta Blockers Failed - 12/26/2022  3:12 PM      Failed - Last BP in normal range    BP Readings from Last 1 Encounters:  12/15/22 (!) 143/85         Passed - Last Heart Rate in normal range    Pulse Readings from Last 1 Encounters:  12/15/22 85         Passed - Valid encounter within last 6 months    Recent Outpatient Visits           2 weeks ago Urinary symptom or sign   [facility] Simmons-Robinson, [place], MD   2 weeks ago Urinary symptom or sign   [facility] [place], Monico Blitz, DO   2 months ago Dysfunction of left eustachian tube   [facility] Simmons-Robinson, Tawanna Cooler, MD   2 months ago SOB (shortness of breath)   [place] [facility] Simmons-Robinson, [place], MD   4 months ago Essential (primary) hypertension    [facility] Simmons-Robinson, [place], MD       Future Appointments             In 1 month Simmons-Robinson, Tawanna Cooler, MD [facility]   In 2 months Simmons-Robinson, Financial controller, MD [facility]   In 3 months Simmons-Robinson, Financial controller, MD [facility]   In 6 months Stoioff, Verna Czech, MD [facility] [place]

## 2023-01-08 ENCOUNTER — Ambulatory Visit: Payer: Medicare HMO

## 2023-01-08 NOTE — Progress Notes (Unsigned)
Care Management & Coordination Services Pharmacy Note  01/08/2023 Name:  Leah Schaefer MRN:  [id number] DOB:  08-16-1940  Summary: ***  Patient continues to improve, although she does report 10 falls due to leg weakness.    Fatigue, Appetite is reduced, tries to eat three times daily.     Recommendations/Changes made from today's visit: ***  Follow up plan: ***   Patient-Specific Goals:  Subjective: Leah Schaefer is an 82 y.o. year old female who is a primary patient of Simmons-Robinson, Tawanna Cooler, MD.  The care coordination team was consulted for assistance with disease management and care coordination needs.    {CCMTELEPHONEFACETOFACE:21091510} for {CCMINITIALFOLLOWUPCHOICE:21091511}.  Recent office visits: 12/11/22: Patient presented to Dr. Neita Garnet for UTI follow-up. 12/08/22: Patient presented to Dr. Payton Mccallum for UTI.  Recent consult visits: 11/28/22: Patient presented to Dr. Sherryll Burger (Neurology)   Hospital visits: 12/15/22: Patient presented to ED for n/v.  11/28/22: Patient presented to ED For fall  Objective:  Lab Results  Component Value Date   CREATININE 0.96 12/14/2022   BUN 11 12/14/2022   EGFR 65 08/23/2022   GFRNONAA 60 (L) 12/14/2022   GFRAA 83 05/31/2020   NA 139 12/14/2022   K 3.7 12/14/2022   CALCIUM 9.8 12/14/2022   CO2 29 12/14/2022   GLUCOSE 133 (H) 12/14/2022    Lab Results  Component Value Date/Time   HGBA1C 7.0 (H) 08/23/2022 04:35 PM   HGBA1C 7.0 (A) 01/19/2022 04:36 PM   HGBA1C 7.8 (H) 10/14/2021 02:48 PM   MICROALBUR 100 04/23/2017 09:31 AM   MICROALBUR Negative 03/30/2015 02:56 PM    Last diabetic Eye exam:  Lab Results  Component Value Date/Time   HMDIABEYEEXA No Retinopathy 01/09/2018 12:00 AM    Last diabetic Foot exam:  Lab Results  Component Value Date/Time   HMDIABFOOTEX normal 03/22/2017 12:00 AM     Lab Results  Component Value Date   CHOL 110 10/14/2021   HDL 33 (L) 10/14/2021   LDLCALC 51 10/14/2021    TRIG 151 (H) 10/14/2021   CHOLHDL 3.3 10/14/2021       Latest Ref Rng & Units 12/14/2022    6:45 PM 09/23/2022    7:25 PM 08/23/2022    4:35 PM  Hepatic Function  Total Protein 6.5 - 8.1 g/dL 7.6  7.8  7.1   Albumin 3.5 - 5.0 g/dL 4.6  4.3  4.7   AST 15 - 41 U/L 20  21  16    ALT 0 - 44 U/L 15  18  12    Alk Phosphatase 38 - 126 U/L 64  64  73   Total Bilirubin 0.3 - 1.2 mg/dL 0.8  1.1  0.2     Lab Results  Component Value Date/Time   TSH 2.110 10/14/2021 02:48 PM   TSH 1.760 05/31/2020 02:30 PM       Latest Ref Rng & Units 12/14/2022    6:45 PM 11/28/2022    6:06 PM 09/23/2022    7:25 PM  CBC  WBC 4.0 - 10.5 K/uL 5.7  5.4  7.1   Hemoglobin 12.0 - 15.0 g/dL 40.9  81.1  91.4   Hematocrit 36.0 - 46.0 % 44.6  42.9  45.9   Platelets 150 - 400 K/uL 244  229  269     No results found for: "VD25OH", "VITAMINB12"  Clinical ASCVD: {YES/NO:21197} The ASCVD Risk score (Arnett DK, et al., 2019) failed to calculate for the following reasons:   The 2019 ASCVD risk score is only  valid for ages 2 to 64   The patient has a prior MI or stroke diagnosis    ***Other: (CHADS2VASc if Afib, MMRC or CAT for COPD, ACT, DEXA)     12/08/2022    3:42 PM 10/30/2022    3:22 PM 10/13/2022   10:48 AM  Depression screen PHQ 2/9  Decreased Interest 3 1 3   Down, Depressed, Hopeless 3 2 3   PHQ - 2 Score 6 3 6   Altered sleeping 0 1 3  Tired, decreased energy 2 3 3   Change in appetite 0 0 3  Feeling bad or failure about yourself  2 1 3   Trouble concentrating 1 0 3  Moving slowly or fidgety/restless 0 0 3  Suicidal thoughts 2 0 3  PHQ-9 Score 13 8 27   Difficult doing work/chores Extremely dIfficult Somewhat difficult Extremely dIfficult     Social History   Tobacco Use  Smoking Status Never  Smokeless Tobacco Never   BP Readings from Last 3 Encounters:  12/15/22 (!) 143/85  12/11/22 (!) 147/88  12/08/22 (!) 146/72   Pulse Readings from Last 3 Encounters:  12/15/22 85  12/11/22 70  12/08/22  68   Wt Readings from Last 3 Encounters:  12/14/22 158 lb (71.7 kg)  12/11/22 170 lb (77.1 kg)  12/08/22 170 lb (77.1 kg)   BMI Readings from Last 3 Encounters:  12/14/22 25.50 kg/m  12/11/22 27.44 kg/m  12/08/22 27.44 kg/m    No Known Allergies  Medications Reviewed Today     Reviewed by [name], MD (Physician) on 12/11/22 at 0913  Med List Status: <None>   Medication Order Taking? Sig Documenting Provider Last Dose Status Informant  ACCU-CHEK AVIVA PLUS test strip [id number] No CHECK  FASTING  BLOOD  GLUCOSE EVERY DAY Bosie Clos, MD Taking Active   Accu-Chek Softclix Lancets lancets [id number] No TEST BLOOD SUGAR EVERY DAY Bosie Clos, MD Taking Active   acetaminophen (TYLENOL) 500 MG tablet [id number] No Take 500 mg by mouth every 6 (six) hours as needed for mild pain or headache.  [provider] Taking Active Self  atorvastatin (LIPITOR) 40 MG tablet [id number] No TAKE 1 TABLET EVERY EVENING Bosie Clos, MD Taking Active   buPROPion (WELLBUTRIN XL) 300 MG 24 hr tablet [id number] No Take 1 tablet (300 mg total) by mouth daily. Bosie Clos, MD Taking Active   Calcium Carb-Cholecalciferol 600-800 MG-UNIT TABS [id number] No Take 1 tablet by mouth daily. [provider] Taking Active Self  cholecalciferol (VITAMIN D) 1000 units tablet [id number] No Take 1,000 Units by mouth daily. [provider] Taking Active Self  clopidogrel (PLAVIX) 75 MG tablet [id number] No TAKE 1 TABLET EVERY DAY Simmons-Robinson, [place], MD Taking Active   cyanocobalamin 500 MCG tablet [id number] No Take 500 mcg by mouth daily. [provider] Taking Active Self  donepezil (ARICEPT) 5 MG tablet [id number] No Take 5 mg by mouth at bedtime. [provider] Taking Active   escitalopram (LEXAPRO) 20 MG tablet [id number] No Take 20 mg by mouth daily. [provider] Taking Active   fluticasone (FLONASE) 50 MCG/ACT nasal  spray [id number] No Place 2 sprays into both nostrils daily. Simmons-Robinson, [place], MD Taking Active   gabapentin (NEURONTIN) 100 MG capsule [id number] No Take 100 mg by mouth 3 (three) times daily. [provider] Taking Active   latanoprost (XALATAN) 0.005 % ophthalmic solution [id number] No Place 1 drop into both eyes at bedtime.  [provider] Taking Active Self  losartan (COZAAR) 50 MG tablet [id number] No TAKE 1 TABLET EVERY DAY Bosie Clos, MD Taking Active   meclizine (ANTIVERT) 25 MG tablet [id number] No Take 0.5 tablets (12.5 mg total) by mouth 3 (three) times daily as needed for dizziness.  Patient not taking: Reported on 12/08/2022   Loleta Rose, MD Not Taking Active Self  metFORMIN (GLUCOPHAGE) 1000 MG tablet [id number] No TAKE 1 TABLET ONCE DAILY WITH A MEAL Simmons-Robinson, [place], MD Taking Active   metoprolol tartrate (LOPRESSOR) 25 MG tablet [id number] No Take 0.5 tablets (12.5 mg total) by mouth 2 (two) times daily. Simmons-Robinson, [place], MD Taking Active   Multiple Vitamin (MULTIVITAMIN WITH MINERALS) TABS tablet [id number] No Take 1 tablet by mouth daily. [provider] Taking Active Self  nitrofurantoin, macrocrystal-monohydrate, (MACROBID) 100 MG capsule [id number]  Take 1 capsule (100 mg total) by mouth 2 (two) times daily for 5 days. Sherlyn Hay, DO  Active   ondansetron (ZOFRAN) 4 MG tablet [id number]  Take 1 tablet (4 mg total) by mouth every 8 (eight) hours as needed for nausea or vomiting. Sherlyn Hay, DO  Active   pantoprazole (PROTONIX) 40 MG tablet [id number] No Take 1 tablet (40 mg total) by mouth daily. Simmons-Robinson, [place], MD Taking Active   pyridoxine (B-6) 100 MG tablet [id number] No Take 100 mg by mouth daily. [provider] Taking Active Self  sertraline (ZOLOFT) 100 MG tablet [id number] No Take 0.5 tablets (50 mg total) by mouth daily. Simmons-Robinson, [place], MD Taking Active   vitamin C (ASCORBIC ACID)  500 MG tablet [id number] No Take 500 mg by mouth daily. [provider] Taking Active Self  vitamin E 400 UNIT capsule [id number] No Take 400 Units by mouth daily. [provider] Taking Active Self            SDOH:  (Social Determinants of Health) assessments and interventions performed: {yes/no:20286} SDOH Interventions    Flowsheet Row Clinical Support from 10/30/2022 in [facility] Office Visit from 10/13/2022 in [facility] Telephone from 09/26/2022 in [facility] Community Care Coordination Office Visit from 05/23/2022 in [facility] Clinical Support from 10/25/2021 in [facility] Chronic Care Management from 06/20/2021 in [facility]  SDOH Interventions        Food Insecurity Interventions -- -- Intervention Not Indicated -- Intervention Not Indicated --  Housing Interventions -- -- Intervention Not Indicated -- Intervention Not Indicated --  Transportation Interventions Intervention Not Indicated -- Intervention Not Indicated -- Intervention Not Indicated --  Alcohol Usage Interventions Intervention Not Indicated (Score <7) -- -- -- -- --  Depression Interventions/Treatment  -- Medication -- Currently on Treatment Medication --  Financial Strain Interventions -- -- -- -- Intervention Not Indicated Intervention Not Indicated  Physical Activity Interventions Intervention Not Indicated -- -- -- Intervention Not Indicated --  Stress Interventions Intervention Not Indicated -- -- -- Intervention Not Indicated --  Social Connections Interventions Intervention Not Indicated -- -- -- Intervention Not Indicated --       Medication Assistance: {MEDASSISTANCEINFO:25044}  Medication Access: Within the past 30 days, how often has patient missed a dose of medication? *** Is a pillbox or other method used to improve adherence?  {YES/NO:21197} Factors that may affect medication adherence? {CHL DESC; BARRIERS:21522} Are meds synced by current pharmacy? {YES/NO:21197} Are meds delivered by current pharmacy? {YES/NO:21197} Does patient experience delays in picking up medications due to transportation concerns? {YES/NO:21197}  Compliance/Adherence/Medication fill history: Care Gaps: ***  Star-Rating Drugs: ***   Assessment/Plan  Hypertension (BP goal <130/80) -Controlled -Current treatment: Losartan 50 mg daily  Metoprolol tartrate 25 mg 1/2 tablet twice daily  -Medications previously tried: NA  -Current home readings: 132/74,  -Patient has not had chlorthalidone filled in some time. Mild swelling in left foot.  -Drinks mainly water, Coke zero  -Recommended to continue current medication  Hyperlipidemia: (LDL goal < 70) -Controlled: Not addressed during this visit. -History of stroke, carotid arterial disease  -Current treatment: Atorvastatin 40 mg daily -Current treatment: Clopidogrel 75 mg daily -Medications previously tried: NA -Recommended to continue current medication  Diabetes (A1c goal <8%) -Controlled -Current medications: Metformin 1000 mg twice daily  -Medications previously tried: NA  -Current home glucose readings fasting glucose: 151, 130,  -Denies hypoglycemic/hyperglycemic symptoms -Recommended to continue current medication  Depression/Anxiety (Goal: maintain symptom remission) -Controlled -Current treatment: Bupropion XL 300 mg daily  Sertraline 100 mg 1/2 tablet daily  -Medications previously tried/failed: NA -PHQ9: 3 -Some depressed mood, anhedonia. Overall feels her symptoms  -Continue current medications     12/08/2022    3:42 PM 10/30/2022    3:22 PM 10/13/2022   10:48 AM  PHQ9 SCORE ONLY  PHQ-9 Total Score 13 8 27      GERD (Goal: Prevent reflux) -Controlled -Current treatment  Pantoprazole 40 mg daily  -Medications previously tried: NA -Patient with  excellent control of GERD symptoms, denies any breakthrough symptoms. Patient interested in tapering if possible.  Chronic Kidney Disease Stage 3a  -All medications assessed for renal dosing and appropriateness in chronic kidney disease. -Recommended to continue current medication    Follow Up Plan: {CM FOLLOW UP PLAN:22241}    ***

## 2023-01-11 NOTE — Progress Notes (Signed)
Celso Amy, PA-C [address]  [address]  [place], [place] [address]  Main: [phone]  Fax: [phone]   Gastroenterology Consultation  Referring Provider:     Brett Albino* Primary Care Physician:  [name], MD Primary Gastroenterologist:  Celso Amy, PA-C / Dr. Wyline Mood  Reason for Consultation:     ED f/u N/V, Hiatal hernia, GERD        HPI:   Leah Schaefer is a 82 y.o. y/o female referred for consultation & management  by [name], MD.    Past medical history of dementia, anxiety/depression, type 2 diabetes, hypertension, history of breast cancer.  She went to [facility] ED 12/15/2022 for epigastric pain and regurgitation, nausea/vomiting, and solid food intolerance.  Not able to eat.  She was able to drink liquids.  Treated for UTI with Macrobid 12/08/2022.  Abdominal pelvic CT with IV contrast 12/15/2022 showed a small hiatal hernia, fatty liver, otherwise no acute abnormality.  She was given Reglan 10 Mg 3 times daily and Zofran 8 Mg as needed.  Also on Protonix 40 Mg daily.  She was told to follow-up with GI.  Labs 12/15/2022 showed mildly elevated glucose 133, otherwise normal CMP and lipase.  Normal CBC with WBC 5.7, hemoglobin 14.7.  Negative COVID, RSV, and influenza.  She is here today with her daughter who helps with her care.  Patient has moderate to severe dementia.  Since ED visit, patient is feeling a lot better.  Taking MiraLAX every day with good control of constipation.  Has not had any more vomiting episodes since ED visit.  GERD has improved on pantoprazole 40 Mg once daily.  Needs refill of Zofran for nausea.  She is still taking Reglan.  No adverse side effects.  Denies current abdominal pain.  Last colonoscopy done 02/2015 by Dr. Bluford Kaufmann showed good prep, normal with no polyps.  Had EGD in 2011.  Ventral umbilical hernia repair in 2009.  Cholecystectomy in 2007.   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 Chemotherapy/post wide excision whole breast radiation.   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    Personal history of chemotherapy 2013   LEFT BREAST   Personal history of malignant neoplasm of breast    Personal history of radiation therapy 2017    Rt, 2013 had on left   Stroke (HCC) 09/06/2020   1 cm acute infarction in the right parietal deep and subcortical white matter   Vertigo     Past Surgical History:  Procedure  Laterality Date   BREAST BIOPSY Left 07/18/2011   +   BREAST BIOPSY Left 09/21/2015   neg   BREAST BIOPSY Left 04/05/2016   neg   BREAST BIOPSY Left 03/20/2019   [place] bx 2 areas /fat necrosis at 9:30 ribbon and 5:30 coil   BREAST EXCISIONAL BIOPSY Right 08/2015   [facility]   BREAST LUMPECTOMY Right 09/30/2015   [facility] WITH MICROPAPILLARY FEATURES AND DCIS/CLEAR MARGINS, NEGATIVE LN   BREAST LUMPECTOMY Left 08/17/2011   BREAST LUMPECTOMY WITH SENTINEL LYMPH NODE BIOPSY Right 09/30/2015   Procedure: BREAST LUMPECTOMY WITH SENTINEL LYMPH NODE BX;  Surgeon: Earline Mayotte, MD;  Location: [facility] ORS;  Service: General;  Laterality: Right;   BREAST SURGERY Left  2012   wide excision   BREAST SURGERY Left November 2013   Core biopsy of the upper-outer quadrant showed fat necrosis.   CHOLECYSTECTOMY  2007   COLONOSCOPY  2011   Dr Bluford Kaufmann   COLONOSCOPY WITH PROPOFOL N/A 02/25/2015   Procedure: COLONOSCOPY WITH PROPOFOL;  Surgeon: Wallace Cullens, MD;  Location: [facility]COPY;  Service: Gastroenterology;  Laterality: N/A;   CYSTOSCOPY WITH STENT PLACEMENT Left 11/05/2020   Procedure: CYSTOSCOPY WITH STENT PLACEMENT;  Surgeon: Riki Altes, MD;  Location: [facility] ORS;  Service: Urology;  Laterality: Left;   CYSTOSCOPY/URETEROSCOPY/HOLMIUM LASER/STENT PLACEMENT Left 12/07/2020   Procedure: CYSTOSCOPY/URETEROSCOPY/HOLMIUM LASER/STENT PLACEMENT;  Surgeon: Riki Altes, MD;  Location: [facility] ORS;  Service: Urology;  Laterality: Left;   DILATION AND CURETTAGE OF UTERUS  2004   HERNIA REPAIR Right 09/10/2007   Umbilical/ventral hernia repaired with 6.4 cm Proceed ventral patch   PORT-A-CATH REMOVAL     PORTACATH PLACEMENT  2013   RECURRENT HERNIA N/A 01/07/2008   Recurrent ventral hernia at the umbilicus, laparoscopy, open placement of a large Ultra Pro mesh with trans-fascial sutures.   UPPER GI ENDOSCOPY  2011    Prior to Admission medications   Medication Sig Start Date End Date Taking? Authorizing Provider  ACCU-CHEK AVIVA PLUS test strip CHECK  FASTING  BLOOD  GLUCOSE EVERY DAY 03/22/22   Bosie Clos, MD  Accu-Chek Softclix Lancets lancets TEST BLOOD SUGAR EVERY DAY 03/22/22   Bosie Clos, MD  acetaminophen (TYLENOL) 500 MG tablet Take 500 mg by mouth every 6 (six) hours as needed for mild pain or headache.     [provider]  atorvastatin (LIPITOR) 40 MG tablet Take 1 tablet (40 mg total) by mouth every evening. 12/27/22   Simmons-Robinson, Tawanna Cooler, MD  buPROPion (WELLBUTRIN XL) 300 MG 24 hr tablet Take 1 tablet (300 mg total) by mouth daily. 12/11/22   Simmons-Robinson, Makiera, MD  Calcium Carb-Cholecalciferol 600-800 MG-UNIT TABS Take 1  tablet by mouth daily.    [provider]  cholecalciferol (VITAMIN D) 1000 units tablet Take 1,000 Units by mouth daily.    [provider]  clopidogrel (PLAVIX) 75 MG tablet TAKE 1 TABLET EVERY DAY 05/26/22   Simmons-Robinson, Makiera, MD  cyanocobalamin 500 MCG tablet Take 500 mcg by mouth daily.    [provider]  donepezil (ARICEPT) 5 MG tablet Take 5 mg by mouth at bedtime.    [provider]  fluticasone (FLONASE) 50 MCG/ACT nasal spray Place 2 sprays into both nostrils daily. 10/13/22   Simmons-Robinson, Makiera, MD  gabapentin (NEURONTIN) 100 MG capsule Take 100 mg by mouth 3 (three) times daily.    [provider]  latanoprost (XALATAN) 0.005 % ophthalmic solution Place 1 drop into both eyes at  bedtime.  10/11/16   [provider]  losartan (COZAAR) 50 MG tablet TAKE 1 TABLET EVERY DAY 03/14/22   Bosie Clos, MD  meclizine (ANTIVERT) 25 MG tablet Take 0.5 tablets (12.5 mg total) by mouth 3 (three) times daily as needed for dizziness. Patient not taking: Reported on 12/08/2022 10/27/15   Loleta Rose, MD  metFORMIN (GLUCOPHAGE) 1000 MG tablet TAKE 1 TABLET ONCE DAILY WITH A MEAL 10/13/22   Simmons-Robinson, Tawanna Cooler, MD  metoCLOPramide (REGLAN) 10 MG tablet Take 1 tablet (10 mg total) by mouth 3 (three) times daily with meals. 12/15/22 12/15/23  Merwyn Katos, MD  metoprolol tartrate (LOPRESSOR) 25 MG tablet TAKE 1/2 TABLET TWICE DAILY 12/27/22   Simmons-Robinson, Tawanna Cooler, MD  Multiple Vitamin (MULTIVITAMIN WITH MINERALS) TABS tablet Take 1 tablet by mouth daily.    [provider]  ondansetron (ZOFRAN) 4 MG tablet Take 1 tablet (4 mg total) by mouth every 8 (eight) hours as needed for nausea or vomiting. 12/08/22   Sherlyn Hay, DO  ondansetron (ZOFRAN-ODT) 8 MG disintegrating tablet Take 1 tablet (8 mg total) by mouth every 8 (eight) hours as needed for nausea or vomiting. 12/15/22   Merwyn Katos, MD  pantoprazole (PROTONIX) 40  MG tablet Take 1 tablet (40 mg total) by mouth daily. 06/15/22   Simmons-Robinson, Makiera, MD  pyridoxine (B-6) 100 MG tablet Take 100 mg by mouth daily.    [provider]  sertraline (ZOLOFT) 100 MG tablet Take 0.5 tablets (50 mg total) by mouth daily. 12/11/22   Simmons-Robinson, Makiera, MD  vitamin C (ASCORBIC ACID) 500 MG tablet Take 500 mg by mouth daily.    [provider]  vitamin E 400 UNIT capsule Take 400 Units by mouth daily.    [provider]    Family History  Problem Relation Age of Onset   Lymphoma Father    Ovarian cancer Sister    Colon cancer Brother    Lymphoma Brother    Cancer Other        stomach   Cancer Other        stomach   Breast cancer Neg Hx      Social History   Tobacco Use   Smoking status: Never   Smokeless tobacco: Never  Vaping Use   Vaping Use: Never used  Substance Use Topics   Alcohol use: No   Drug use: No    Allergies as of 01/12/2023   (No Known Allergies)    Review of Systems:    All systems reviewed and negative except where noted in HPI.   Physical Exam:  There were no vitals taken for this visit. No LMP recorded. Patient is postmenopausal. Psych:  Alert and cooperative. Normal mood and affect. General:   Alert,  Well-developed, well-nourished, pleasant and cooperative in NAD Head:  Normocephalic and atraumatic. Eyes:  Sclera clear, no icterus.   Conjunctiva pink. Lungs:  Respirations even and unlabored.  Clear throughout to auscultation.   No wheezes, crackles, or rhonchi. No acute distress. Heart:  Regular rate and rhythm; no murmurs, clicks, rubs, or gallops. Abdomen:  Normal bowel sounds.  No bruits.  Soft, and non-distended without masses, hepatosplenomegaly or hernias noted.  No Tenderness.  No guarding or rebound tenderness.    Neurologic:  Alert and oriented x3;  grossly normal neurologically. Psych:  Alert and cooperative. Normal mood and affect.  Moderate memory impairment  noted.  Imaging Studies: CT ABDOMEN PELVIS W CONTRAST  Result Date: 12/14/2022  CLINICAL DATA:  Acute abdominal pain with nausea and vomiting for 1 month. Unintended weight loss EXAM: CT ABDOMEN AND PELVIS WITH CONTRAST TECHNIQUE: Multidetector CT imaging of the abdomen and pelvis was performed using the standard protocol following bolus administration of intravenous contrast. RADIATION DOSE REDUCTION: This exam was performed according to the departmental dose-optimization program which includes automated exposure control, adjustment of the mA and/or kV according to patient size and/or use of iterative reconstruction technique. CONTRAST:  OMNIPAQUE IOHEXOL 300 MG/ML  SOLN COMPARISON:  09/23/2022 FINDINGS: Lower chest: Lung bases are free of acute infiltrate or sizable effusion. Mild scarring is noted in the medial right lung base. Sliding-type hiatal hernia is noted. Hepatobiliary: Fatty infiltration of the liver is noted. The gallbladder has been surgically removed. Pancreas: Unremarkable. No pancreatic ductal dilatation or surrounding inflammatory changes. Spleen: Normal in size without focal abnormality. Adrenals/Urinary Tract: Adrenal glands are within normal limits. Kidneys demonstrate a normal enhancement pattern bilaterally. Normal excretion is noted on delayed images. No renal calculi or obstructive changes are seen. Bladder is decompressed. Stomach/Bowel: No obstructive or inflammatory changes of colon are noted. The appendix is within normal limits. Small bowel and stomach are unremarkable aside from the previously described hiatal hernia. Vascular/Lymphatic: Aortic atherosclerosis. No enlarged abdominal or pelvic lymph nodes. Reproductive: Uterus and bilateral adnexa are unremarkable. Other: No abdominal wall hernia or abnormality. No abdominopelvic ascites. Musculoskeletal: Degenerative changes of the lumbar spine are noted. IMPRESSION: Small hiatal hernia. Fatty liver. No acute abnormality  noted. Electronically Signed   By: Alcide Clever M.D.   On: 12/14/2022 22:58    Assessment and Plan:   SOPHIAGRACE QUINLEY is a 82 y.o. y/o female has been referred for a small hiatal hernia seen on CT, food intolerance, nausea/vomiting, and epigastric pain.  Recent abdominal pelvic CT showed no acute abnormality.  Incidental small hiatal hernia.  Previous cholecystectomy.  Symptoms are most consistent with acid reflux and chronic constipation.  Symptoms have greatly improved on pantoprazole 40 Mg once daily and MiraLAX.  Takes Zofran as needed.  I recommend she stop Reglan (metoclopramide) due to increased risk of tardive dyskinesia and adverse side effects.  If she has recurrent nausea and vomiting, then I would recommend gastric emptying study to check for gastroparesis.  Nausea/vomiting - Resolved  Continue Zofran 8 Mg every 8 hours as needed.  Stop Reglan (metoclopramide).  If nausea and vomiting returns, then order gastric emptying study.  GERD  Continue pantoprazole 40 Mg once daily.  Recommend Lifestyle Modifications to prevent Acid Reflux.  Rec. Avoid coffee, sodas, peppermint, citrus fruits, and spicey foods.  Avoid eating 2-3 hours before bedtime.   Small hiatal hernia  Reassurance.  Chronic Constipation  Continue MiraLAX 1 capful in a drink once daily as needed.  Follow up as needed if she has recurrent GI symptoms.  Celso Amy, PA-C

## 2023-01-12 ENCOUNTER — Ambulatory Visit: Payer: Medicare HMO | Admitting: Physician Assistant

## 2023-01-12 ENCOUNTER — Encounter: Payer: Self-pay | Admitting: Physician Assistant

## 2023-01-12 VITALS — BP 134/79 | HR 74 | Temp 97.8°F | Ht 66.0 in | Wt 162.4 lb

## 2023-01-12 DIAGNOSIS — R112 Nausea with vomiting, unspecified: Secondary | ICD-10-CM

## 2023-01-12 DIAGNOSIS — K219 Gastro-esophageal reflux disease without esophagitis: Secondary | ICD-10-CM | POA: Diagnosis not present

## 2023-01-12 MED ORDER — ONDANSETRON 8 MG PO TBDP
8.0000 mg | ORAL_TABLET | Freq: Three times a day (TID) | ORAL | 3 refills | Status: AC | PRN
Start: 2023-01-12 — End: 2023-04-12

## 2023-01-12 MED ORDER — PANTOPRAZOLE SODIUM 40 MG PO TBEC
40.0000 mg | DELAYED_RELEASE_TABLET | Freq: Every day | ORAL | 3 refills | Status: DC
Start: 2023-01-12 — End: 2023-04-19

## 2023-01-12 NOTE — Patient Instructions (Signed)
Stop Reglan (Metoclopramide). Continue Pantoprazole 40mg  1 tablet once daily for GERD (acid reflux). You can take Zofran (ondansetron) every 8 hours as needed for nausea.

## 2023-01-26 ENCOUNTER — Other Ambulatory Visit: Payer: Self-pay | Admitting: *Deleted

## 2023-01-26 DIAGNOSIS — C50411 Malignant neoplasm of upper-outer quadrant of right female breast: Secondary | ICD-10-CM

## 2023-01-31 ENCOUNTER — Encounter: Payer: Self-pay | Admitting: Pharmacist

## 2023-01-31 NOTE — Progress Notes (Signed)
Patient previously followed by [facility] pharmacist. Per clinical review, no pharmacist appointment needed at this time. Followed by care management. Care guide directed to contact patient and cancel pharmacy appointment and notify pharmacy team of any patient concerns.

## 2023-02-01 NOTE — Progress Notes (Addendum)
I,Leah  Schaefer,acting as a Neurosurgeon for [employer], MD.,have documented all relevant documentation on the behalf of Leah Ramp, MD,as directed by  Leah Ramp, MD while in the presence of Leah Ramp, MD.   Established patient visit   Patient: Leah Schaefer   DOB: 1941-01-15   82 y.o. Female  MRN: [id number] Visit Date: 02/02/2023  Today's healthcare provider: [name], MD   Chief Complaint  Patient presents with   Depression    Would like to discuss going from 50mg  to 100mg  of the zoloft   Fall    Pt states to have fallen about 2 weeks ago and would like have her right side checked due to pain   Subjective     Discussed the use of AI scribe software for clinical note transcription with the patient, who gave verbal consent to proceed.  HPI     Depression    Additional comments: Would like to discuss going from 50mg  to 100mg  of the zoloft        Fall    Additional comments: Pt states to have fallen about 2 weeks ago and would like have her right side checked due to pain      Last edited by Lubertha Basque, CMA on 02/02/2023  1:15 PM.      Depression, Follow-up  She  was last seen for this 6 weeks ago. Changes made at last visit include continue Wellbutrin 150mg  daily and restarted Zoloft 50mg  daily    She reports good compliance with treatment. She is not having side effects.   She reports good tolerance of treatment. Current symptoms include: depressed mood She feels she is Improved since last visit.     02/02/2023    1:21 PM 12/08/2022    3:42 PM 10/30/2022    3:22 PM  Depression screen PHQ 2/9  Decreased Interest 1 3 1   Down, Depressed, Hopeless 1 3 2   PHQ - 2 Score 2 6 3   Altered sleeping 1 0 1  Tired, decreased energy 1 2 3   Change in appetite 0 0 0  Feeling bad or failure about yourself  1 2 1   Trouble concentrating 1 1 0  Moving slowly or fidgety/restless 1 0 0  Suicidal  thoughts 0 2 0  PHQ-9 Score 7 13 8   Difficult doing work/chores Somewhat difficult Extremely dIfficult Somewhat difficult    -----------------------------------------------------------------------------------------    Fall  Difficulty with Ambulation  The patient, who has been adjusting to the use of a walker, reported a recent fall while attempting to vacuum. The fall was not due to tripping over an object, but possibly due to a misstep. The patient did not lose consciousness during the fall and landed primarily on her face, resulting in a visible scar. The fall occurred between the 10th and 12th of the current month.  The patient's right side was sore after the fall, but the pain has been managed with salon patches and pain rubs. The patient denied any shortness of breath or difficulty breathing. Pain continues to be present and family wonders if patient sustained rib fracture.    Depressed Affect The patient's caregiver noted a change in the patient's behavior around the time of the fall, suggesting that the prescribed 50mg  of Sertraline was not effective. The dosage was increased to 100mg , which seemed to improve the patient's condition. The patient, however, did not report feeling any different. The caregiver observed that the patient was taking her medication on time, not sleeping  all day, and was more interactive.  The patient has been eating three meals a day, mostly salads for lunch and dinner.    Hx of Stroke  The patient has a history of multiple falls and was previously diagnosed with vertigo. However, she has not experienced vertigo since having a stroke in January 2002. The patient is also on Plavix and Lipitor, and previously took Meclizine for vertigo, but has not needed it recently.       Medications: Outpatient Medications Prior to Visit  Medication Sig   ACCU-CHEK AVIVA PLUS test strip CHECK  FASTING  BLOOD  GLUCOSE EVERY DAY   Accu-Chek Softclix Lancets lancets TEST  BLOOD SUGAR EVERY DAY   acetaminophen (TYLENOL) 500 MG tablet Take 500 mg by mouth every 6 (six) hours as needed for mild pain or headache.    atorvastatin (LIPITOR) 40 MG tablet Take 1 tablet (40 mg total) by mouth every evening.   buPROPion (WELLBUTRIN XL) 300 MG 24 hr tablet Take 1 tablet (300 mg total) by mouth daily.   Calcium Carb-Cholecalciferol 600-800 MG-UNIT TABS Take 1 tablet by mouth daily.   cholecalciferol (VITAMIN D) 1000 units tablet Take 1,000 Units by mouth daily.   cyanocobalamin 500 MCG tablet Take 500 mcg by mouth daily.   donepezil (ARICEPT) 5 MG tablet Take 5 mg by mouth at bedtime.   fluticasone (FLONASE) 50 MCG/ACT nasal spray Place 2 sprays into both nostrils daily.   gabapentin (NEURONTIN) 100 MG capsule Take 100 mg by mouth 3 (three) times daily.   latanoprost (XALATAN) 0.005 % ophthalmic solution Place 1 drop into both eyes at bedtime.    losartan (COZAAR) 50 MG tablet TAKE 1 TABLET EVERY DAY   metoprolol tartrate (LOPRESSOR) 25 MG tablet TAKE 1/2 TABLET TWICE DAILY   Multiple Vitamin (MULTIVITAMIN WITH MINERALS) TABS tablet Take 1 tablet by mouth daily.   ondansetron (ZOFRAN) 4 MG tablet Take 1 tablet (4 mg total) by mouth every 8 (eight) hours as needed for nausea or vomiting.   ondansetron (ZOFRAN-ODT) 8 MG disintegrating tablet Take 1 tablet (8 mg total) by mouth every 8 (eight) hours as needed for nausea or vomiting.   pantoprazole (PROTONIX) 40 MG tablet Take 1 tablet (40 mg total) by mouth daily.   pyridoxine (B-6) 100 MG tablet Take 100 mg by mouth daily.   vitamin C (ASCORBIC ACID) 500 MG tablet Take 500 mg by mouth daily.   vitamin E 400 UNIT capsule Take 400 Units by mouth daily.   [DISCONTINUED] clopidogrel (PLAVIX) 75 MG tablet TAKE 1 TABLET EVERY DAY   [DISCONTINUED] metFORMIN (GLUCOPHAGE) 1000 MG tablet TAKE 1 TABLET ONCE DAILY WITH A MEAL   [DISCONTINUED] sertraline (ZOLOFT) 100 MG tablet Take 0.5 tablets (50 mg total) by mouth daily.    [DISCONTINUED] meclizine (ANTIVERT) 25 MG tablet Take 0.5 tablets (12.5 mg total) by mouth 3 (three) times daily as needed for dizziness. (Patient not taking: Reported on 02/02/2023)   No facility-administered medications prior to visit.    Review of Systems     Objective    BP 130/79 (BP Location: Right Arm, Patient Position: Sitting, Cuff Size: Normal)   Pulse 72   Resp 13   Ht 5\' 6"  (1.676 m)   Wt 157 lb 14.4 oz (71.6 kg)   SpO2 97%   BMI 25.49 kg/m    Physical Exam Vitals reviewed.  Constitutional:      General: She is not in acute distress.    Appearance: Normal appearance. She  is not ill-appearing, toxic-appearing or diaphoretic.  Eyes:     Conjunctiva/sclera: Conjunctivae normal.  Cardiovascular:     Rate and Rhythm: Normal rate and regular rhythm.     Pulses: Normal pulses.     Heart sounds: Normal heart sounds. No murmur heard.    No friction rub. No gallop.  Pulmonary:     Effort: Pulmonary effort is normal. No respiratory distress.     Breath sounds: Normal breath sounds. No stridor. No wheezing, rhonchi or rales.  Chest:     Comments: Right sided tenderness to palpation  No deformities noted or breaks in the skin No bruising or swelling noted on today's exam  Abdominal:     General: Bowel sounds are normal. There is no distension.     Palpations: Abdomen is soft.     Tenderness: There is no abdominal tenderness.  Musculoskeletal:     Right lower leg: No edema.     Left lower leg: No edema.     Comments: Tenderness to palpation in area of right thorax and right flank   Skin:    Findings: No erythema or rash.  Neurological:     Mental Status: She is alert and oriented to person, place, and time.      Results for orders placed or performed in visit on 02/02/23  Cancer antigen 27.29  Result Value Ref Range   CA 27.29 14.9 0.0 - 38.6 U/mL  CMP (Cancer Center only)  Result Value Ref Range   Sodium 140 135 - 145 mmol/L   Potassium 4.2 3.5 - 5.1 mmol/L    Chloride 105 98 - 111 mmol/L   CO2 27 22 - 32 mmol/L   Glucose, Bld 96 70 - 99 mg/dL   BUN 10 8 - 23 mg/dL   Creatinine 1.61 0.96 - 1.00 mg/dL   Calcium 9.5 8.9 - 04.5 mg/dL   Total Protein 7.2 6.5 - 8.1 g/dL   Albumin 4.2 3.5 - 5.0 g/dL   AST 18 15 - 41 U/L   ALT 16 0 - 44 U/L   Alkaline Phosphatase 61 38 - 126 U/L   Total Bilirubin 0.4 0.3 - 1.2 mg/dL   GFR, Estimated >40 >98 mL/min   Anion gap 8 5 - 15  CBC with Differential (Cancer Center Only)  Result Value Ref Range   WBC Count 5.5 4.0 - 10.5 K/uL   RBC 4.67 3.87 - 5.11 MIL/uL   Hemoglobin 14.2 12.0 - 15.0 g/dL   HCT 11.9 14.7 - 82.9 %   MCV 90.4 80.0 - 100.0 fL   MCH 30.4 26.0 - 34.0 pg   MCHC 33.6 30.0 - 36.0 g/dL   RDW 56.2 13.0 - 86.5 %   Platelet Count 238 150 - 400 K/uL   nRBC 0.0 0.0 - 0.2 %   Neutrophils Relative % 55 %   Neutro Abs 3.1 1.7 - 7.7 K/uL   Lymphocytes Relative 34 %   Lymphs Abs 1.9 0.7 - 4.0 K/uL   Monocytes Relative 8 %   Monocytes Absolute 0.4 0.1 - 1.0 K/uL   Eosinophils Relative 2 %   Eosinophils Absolute 0.1 0.0 - 0.5 K/uL   Basophils Relative 1 %   Basophils Absolute 0.0 0.0 - 0.1 K/uL   Immature Granulocytes 0 %   Abs Immature Granulocytes 0.01 0.00 - 0.07 K/uL    Assessment & Plan     Problem List Items Addressed This Visit     Depressed affect - Primary  Improvement noted with increase in Sertraline to 100mg  daily. No excessive daytime sleepiness, increased interaction, and less depressive ideation reported. -chronic  -Continue Sertraline 100mg  daily. -Continue Wellbutrin 300mg  daily.      Relevant Medications   sertraline (ZOLOFT) 100 MG tablet   Fall from standing    Recent fall with facial trauma. Difficulty adjusting to walker use. No loss of consciousness during fall. Tenderness in right thorax. -acute  -Order chest x-ray to rule out rib fracture or other injury. -Encourage consistent use of walker for mobility and safety. ecchymosis on exam       Relevant  Orders   DG Chest 2 View (Completed)   RESOLVED: Urinary symptom or sign   Other Visit Diagnoses     Sequela of thrombotic cerebrovascular accident (CVA)       Relevant Medications   clopidogrel (PLAVIX) 75 MG tablet   Pain in rib       Relevant Orders   DG Chest 2 View (Completed)        Return for CHRONIC F/U, Mood.       The entirety of the information documented in the History of Present Illness, Review of Systems and Physical Exam were personally obtained by me. Portions of this information were initially documented by Lubertha Basque, CMA . I, Leah Ramp, MD have reviewed the documentation above for thoroughness and accuracy.     Leah Ramp, MD  [facility] [phone] (phone) [phone] (fax)  [facility]

## 2023-02-02 ENCOUNTER — Encounter: Payer: Self-pay | Admitting: Family Medicine

## 2023-02-02 ENCOUNTER — Inpatient Hospital Stay: Payer: Medicare HMO | Attending: Internal Medicine

## 2023-02-02 ENCOUNTER — Ambulatory Visit (INDEPENDENT_AMBULATORY_CARE_PROVIDER_SITE_OTHER): Payer: Medicare HMO | Admitting: Family Medicine

## 2023-02-02 ENCOUNTER — Inpatient Hospital Stay (HOSPITAL_BASED_OUTPATIENT_CLINIC_OR_DEPARTMENT_OTHER): Payer: Medicare HMO | Admitting: Internal Medicine

## 2023-02-02 ENCOUNTER — Encounter: Payer: Self-pay | Admitting: Internal Medicine

## 2023-02-02 VITALS — BP 133/79 | HR 73 | Temp 96.9°F | Ht 66.0 in | Wt 165.0 lb

## 2023-02-02 VITALS — BP 130/79 | HR 72 | Resp 13 | Ht 66.0 in | Wt 157.9 lb

## 2023-02-02 DIAGNOSIS — G8929 Other chronic pain: Secondary | ICD-10-CM | POA: Diagnosis not present

## 2023-02-02 DIAGNOSIS — W19XXXA Unspecified fall, initial encounter: Secondary | ICD-10-CM

## 2023-02-02 DIAGNOSIS — C50411 Malignant neoplasm of upper-outer quadrant of right female breast: Secondary | ICD-10-CM

## 2023-02-02 DIAGNOSIS — R4589 Other symptoms and signs involving emotional state: Secondary | ICD-10-CM | POA: Diagnosis not present

## 2023-02-02 DIAGNOSIS — E785 Hyperlipidemia, unspecified: Secondary | ICD-10-CM | POA: Diagnosis not present

## 2023-02-02 DIAGNOSIS — R399 Unspecified symptoms and signs involving the genitourinary system: Secondary | ICD-10-CM | POA: Diagnosis not present

## 2023-02-02 DIAGNOSIS — Z7901 Long term (current) use of anticoagulants: Secondary | ICD-10-CM | POA: Insufficient documentation

## 2023-02-02 DIAGNOSIS — Z800 Family history of malignant neoplasm of digestive organs: Secondary | ICD-10-CM | POA: Diagnosis not present

## 2023-02-02 DIAGNOSIS — Z8041 Family history of malignant neoplasm of ovary: Secondary | ICD-10-CM | POA: Insufficient documentation

## 2023-02-02 DIAGNOSIS — Z807 Family history of other malignant neoplasms of lymphoid, hematopoietic and related tissues: Secondary | ICD-10-CM | POA: Diagnosis not present

## 2023-02-02 DIAGNOSIS — Z87442 Personal history of urinary calculi: Secondary | ICD-10-CM | POA: Insufficient documentation

## 2023-02-02 DIAGNOSIS — E119 Type 2 diabetes mellitus without complications: Secondary | ICD-10-CM | POA: Diagnosis not present

## 2023-02-02 DIAGNOSIS — Z170 Estrogen receptor positive status [ER+]: Secondary | ICD-10-CM

## 2023-02-02 DIAGNOSIS — Z79899 Other long term (current) drug therapy: Secondary | ICD-10-CM | POA: Diagnosis not present

## 2023-02-02 DIAGNOSIS — I10 Essential (primary) hypertension: Secondary | ICD-10-CM | POA: Insufficient documentation

## 2023-02-02 DIAGNOSIS — Z7902 Long term (current) use of antithrombotics/antiplatelets: Secondary | ICD-10-CM | POA: Insufficient documentation

## 2023-02-02 DIAGNOSIS — Z9049 Acquired absence of other specified parts of digestive tract: Secondary | ICD-10-CM | POA: Diagnosis not present

## 2023-02-02 DIAGNOSIS — Z923 Personal history of irradiation: Secondary | ICD-10-CM | POA: Insufficient documentation

## 2023-02-02 DIAGNOSIS — M2550 Pain in unspecified joint: Secondary | ICD-10-CM | POA: Diagnosis not present

## 2023-02-02 DIAGNOSIS — M549 Dorsalgia, unspecified: Secondary | ICD-10-CM | POA: Diagnosis not present

## 2023-02-02 DIAGNOSIS — I6930 Unspecified sequelae of cerebral infarction: Secondary | ICD-10-CM | POA: Diagnosis not present

## 2023-02-02 DIAGNOSIS — R0781 Pleurodynia: Secondary | ICD-10-CM

## 2023-02-02 LAB — CBC WITH DIFFERENTIAL (CANCER CENTER ONLY)
Abs Immature Granulocytes: 0.01 10*3/uL (ref 0.00–0.07)
Basophils Absolute: 0 10*3/uL (ref 0.0–0.1)
Basophils Relative: 1 %
Eosinophils Absolute: 0.1 10*3/uL (ref 0.0–0.5)
Eosinophils Relative: 2 %
HCT: 42.2 % (ref 36.0–46.0)
Hemoglobin: 14.2 g/dL (ref 12.0–15.0)
Immature Granulocytes: 0 %
Lymphocytes Relative: 34 %
Lymphs Abs: 1.9 10*3/uL (ref 0.7–4.0)
MCH: 30.4 pg (ref 26.0–34.0)
MCHC: 33.6 g/dL (ref 30.0–36.0)
MCV: 90.4 fL (ref 80.0–100.0)
Monocytes Absolute: 0.4 10*3/uL (ref 0.1–1.0)
Monocytes Relative: 8 %
Neutro Abs: 3.1 10*3/uL (ref 1.7–7.7)
Neutrophils Relative %: 55 %
Platelet Count: 238 10*3/uL (ref 150–400)
RBC: 4.67 MIL/uL (ref 3.87–5.11)
RDW: 12.4 % (ref 11.5–15.5)
WBC Count: 5.5 10*3/uL (ref 4.0–10.5)
nRBC: 0 % (ref 0.0–0.2)

## 2023-02-02 LAB — CMP (CANCER CENTER ONLY)
ALT: 16 U/L (ref 0–44)
AST: 18 U/L (ref 15–41)
Albumin: 4.2 g/dL (ref 3.5–5.0)
Alkaline Phosphatase: 61 U/L (ref 38–126)
Anion gap: 8 (ref 5–15)
BUN: 10 mg/dL (ref 8–23)
CO2: 27 mmol/L (ref 22–32)
Calcium: 9.5 mg/dL (ref 8.9–10.3)
Chloride: 105 mmol/L (ref 98–111)
Creatinine: 0.92 mg/dL (ref 0.44–1.00)
GFR, Estimated: >60 mL/min (ref >60)
Glucose, Bld: 96 mg/dL (ref 70–99)
Potassium: 4.2 mmol/L (ref 3.5–5.1)
Sodium: 140 mmol/L (ref 135–145)
Total Bilirubin: 0.4 mg/dL (ref 0.3–1.2)
Total Protein: 7.2 g/dL (ref 6.5–8.1)

## 2023-02-02 MED ORDER — CLOPIDOGREL BISULFATE 75 MG PO TABS: 75.0000 mg | ORAL_TABLET | Freq: Every day | ORAL | 2 refills | Status: DC

## 2023-02-02 MED ORDER — SERTRALINE HCL 100 MG PO TABS: 100.0000 mg | ORAL_TABLET | Freq: Every day | ORAL | 1 refills | Status: DC

## 2023-02-02 NOTE — Progress Notes (Signed)
No concerns today 

## 2023-02-02 NOTE — Assessment & Plan Note (Addendum)
#   2017 right breast cancer stage I ER/PR positive HER-2/neu negative status post lumpectomy. No chemotherapy/no onotype.G-1 Status post adjuvant radiation.  Stable .   # Mammo- AUg 2023- WNL [Dr.Byrnett]. Discontinued Letrozole in JAN 2023 .  [breast cancer index is low risk of recurrence ].   # BMD: May 2022- T-score of -0.4. continue calcium and vitamin D.  stable.will order in 2025.    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  No evidence of any malignancy on imaging.stable.   # DISPOSITION:  # Bilateral mammogram  # Follow up in 12  months/labs-cbc/cmp/ca-27-29;- MD- dr.B

## 2023-02-02 NOTE — Assessment & Plan Note (Signed)
Improvement noted with increase in Sertraline to 100mg  daily. No excessive daytime sleepiness, increased interaction, and less depressive ideation reported. -chronic  -Continue Sertraline 100mg  daily. -Continue Wellbutrin 300mg  daily.

## 2023-02-02 NOTE — Progress Notes (Signed)
[facility] OFFICE PROGRESS NOTE  Patient Care Team: [name], MD as PCP - General (Family Medicine) Byrnett, Merrily Pew, MD (General Surgery) Earna Coder, MD as Consulting Physician (Internal Medicine) Irene Limbo., MD as Consulting Physician (Ophthalmology) Carmina Miller, MD as Referring Physician (Radiation Oncology)   SUMMARY OF HEMATOLOGIC/ONCOLOGIC HISTORY:  Oncology History Overview Note  # FEB 2017- RIGHT BREAST  STAGE I [T1b N0 M0]. ER/PR ER positive 100% PR positive 95% HER 2 FISH negative;  INVASIVE MAMMARY CARCINOMA WITH MICROPAPILLARY FEATURES. S/p Lumpec [Dr.Byrnett] & RT; No Oncotype; Femara  # 2013- LEFT BREAST- STAGE II; ER/PR/ Her 2 NEG s/p Lumpec [Dr.Byrnett] & RT  # BMD- April  2017- wnl; genetic testing- declined [May 2017]  # Vertigo [uses rolling walker]   Carcinoma of upper-outer quadrant of right breast in female, estrogen receptor positive (HCC)    INTERVAL HISTORY: Alone.  Ambulating independently.   A very pleasant 82 year old African-American female patient with above history of breast cancer most recently diagnosed in February 2017 is here for follow-up; patient currently OFF Femara [since JAN 2023].  Patient has had no further strokes.  No further deficits.  Ambulating independently at this time. Denies any unusual lumps or bumps.  Appetite is fair.  Chronic back pain not any worse.  Review of Systems  Constitutional:  Negative for chills, diaphoresis, fever, malaise/fatigue and weight loss.  HENT:  Negative for nosebleeds and sore throat.   Eyes:  Negative for double vision.  Respiratory:  Negative for cough, hemoptysis, sputum production, shortness of breath and wheezing.   Cardiovascular:  Negative for chest pain, palpitations, orthopnea and leg swelling.  Gastrointestinal:  Negative for abdominal pain, blood in stool, constipation, diarrhea, heartburn, melena, nausea and vomiting.  Genitourinary:   Negative for dysuria, frequency and urgency.  Musculoskeletal:  Positive for back pain and joint pain.  Skin: Negative.  Negative for itching and rash.  Neurological:  Negative for dizziness, tingling, focal weakness, weakness and headaches.  Endo/Heme/Allergies:  Does not bruise/bleed easily.  Psychiatric/Behavioral:  Negative for depression. The patient is not nervous/anxious and does not have insomnia.      PAST MEDICAL HISTORY :  Past Medical History:  Diagnosis Date   Anxiety    Aortic atherosclerosis (HCC)    Arthritis    Breast cancer (HCC) 2013   LEFT breast with chemo and rad tx   Breast cancer (HCC) 2017   right breast with rad tx   Breast cancer of upper-inner quadrant of right female breast (HCC) 09/21/2015   T1bN0 " 10mm; ER100%, PR 90%, HER-2/neu not overexpressed.   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 Chemotherapy/post wide excision whole breast radiation.   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    Personal history of chemotherapy 2013   LEFT BREAST   Personal history of malignant neoplasm of breast    Personal history of radiation therapy 2017    Rt, 2013 had on left  Stroke (HCC) 09/06/2020   1 cm acute infarction in the right parietal deep and subcortical white matter   Vertigo     PAST SURGICAL HISTORY :   Past Surgical History:  Procedure Laterality Date   BREAST BIOPSY Left 07/18/2011   +   BREAST BIOPSY Left 09/21/2015   neg   BREAST BIOPSY Left 04/05/2016   neg   BREAST BIOPSY  Left 03/20/2019   [place] bx 2 areas /fat necrosis at 9:30 ribbon and 5:30 coil   BREAST EXCISIONAL BIOPSY Right 08/2015   [facility]   BREAST LUMPECTOMY Right 09/30/2015   [facility] WITH MICROPAPILLARY FEATURES AND DCIS/CLEAR MARGINS, NEGATIVE LN   BREAST LUMPECTOMY Left 08/17/2011   BREAST LUMPECTOMY WITH SENTINEL LYMPH NODE BIOPSY Right 09/30/2015   Procedure: BREAST LUMPECTOMY WITH SENTINEL LYMPH NODE BX;  Surgeon: Earline Mayotte, MD;  Location: [facility] ORS;  Service: General;  Laterality: Right;   BREAST SURGERY Left 2012   wide excision   BREAST SURGERY Left November 2013   Core biopsy of the upper-outer quadrant showed fat necrosis.   CHOLECYSTECTOMY  2007   COLONOSCOPY  2011   Dr Bluford Kaufmann   COLONOSCOPY WITH PROPOFOL N/A 02/25/2015   Procedure: COLONOSCOPY WITH PROPOFOL;  Surgeon: Wallace Cullens, MD;  Location: [facility] ENDOSCOPY;  Service: Gastroenterology;  Laterality: N/A;   CYSTOSCOPY WITH STENT PLACEMENT Left 11/05/2020   Procedure: CYSTOSCOPY WITH STENT PLACEMENT;  Surgeon: Riki Altes, MD;  Location: [facility] ORS;  Service: Urology;  Laterality: Left;   CYSTOSCOPY/URETEROSCOPY/HOLMIUM LASER/STENT PLACEMENT Left 12/07/2020   Procedure: CYSTOSCOPY/URETEROSCOPY/HOLMIUM LASER/STENT PLACEMENT;  Surgeon: Riki Altes, MD;  Location: [facility] ORS;  Service: Urology;  Laterality: Left;   DILATION AND CURETTAGE OF UTERUS  2004   HERNIA REPAIR Right 09/10/2007   Umbilical/ventral hernia repaired with 6.4 cm Proceed ventral patch   PORT-A-CATH REMOVAL     PORTACATH PLACEMENT  2013   RECURRENT HERNIA N/A 01/07/2008   Recurrent ventral hernia at the umbilicus, laparoscopy, open placement of a large Ultra Pro mesh with trans-fascial sutures.   UPPER GI ENDOSCOPY  2011    FAMILY HISTORY :   Family History  Problem Relation Age of Onset   Lymphoma Father    Ovarian cancer Sister    Colon cancer Brother    Lymphoma Brother    Cancer Other        stomach   Cancer Other        stomach   Breast cancer Neg Hx      SOCIAL HISTORY:   Social History   Tobacco Use   Smoking status: Never   Smokeless tobacco: Never  Vaping Use   Vaping Use: Never used  Substance Use Topics   Alcohol use: No   Drug use: No    ALLERGIES:  has No Known Allergies.  MEDICATIONS:  Current Outpatient Medications  Medication Sig Dispense Refill   ACCU-CHEK AVIVA PLUS test strip CHECK  FASTING  BLOOD  GLUCOSE EVERY DAY 100 strip 0   Accu-Chek Softclix Lancets lancets TEST BLOOD SUGAR EVERY DAY 100 each 0   acetaminophen (TYLENOL) 500 MG tablet Take 500 mg by mouth every 6 (six) hours as needed for mild pain or headache.      atorvastatin (LIPITOR) 40 MG tablet Take 1 tablet (40 mg total) by mouth every evening. 90 tablet 1   buPROPion (WELLBUTRIN XL) 300 MG 24 hr tablet Take 1 tablet (300 mg total) by mouth daily. 90 tablet 1   Calcium Carb-Cholecalciferol 600-800  MG-UNIT TABS Take 1 tablet by mouth daily.     cholecalciferol (VITAMIN D) 1000 units tablet Take 1,000 Units by mouth daily.     clopidogrel (PLAVIX) 75 MG tablet Take 1 tablet (75 mg total) by mouth daily. 90 tablet 2   cyanocobalamin 500 MCG tablet Take 500 mcg by mouth daily.     donepezil (ARICEPT) 5 MG tablet Take 5 mg by mouth at bedtime.     fluticasone (FLONASE) 50 MCG/ACT nasal spray Place 2 sprays into both nostrils daily. 16 g 6   gabapentin (NEURONTIN) 100 MG capsule Take 100 mg by mouth 3 (three) times daily.     latanoprost (XALATAN) 0.005 % ophthalmic solution Place 1 drop into both eyes at bedtime.      losartan (COZAAR) 50 MG tablet TAKE 1 TABLET EVERY DAY 90 tablet 0   metFORMIN (GLUCOPHAGE) 1000 MG tablet TAKE 1 TABLET ONCE DAILY WITH A MEAL 90 tablet 1   metoprolol tartrate (LOPRESSOR) 25 MG tablet TAKE 1/2 TABLET TWICE DAILY 90 tablet 0   Multiple Vitamin (MULTIVITAMIN WITH MINERALS) TABS tablet Take 1 tablet by mouth daily.     ondansetron (ZOFRAN) 4 MG tablet Take 1 tablet (4 mg total) by mouth every 8 (eight) hours as needed for  nausea or vomiting. 25 tablet 0   ondansetron (ZOFRAN-ODT) 8 MG disintegrating tablet Take 1 tablet (8 mg total) by mouth every 8 (eight) hours as needed for nausea or vomiting. 60 tablet 3   pantoprazole (PROTONIX) 40 MG tablet Take 1 tablet (40 mg total) by mouth daily. 90 tablet 3   pyridoxine (B-6) 100 MG tablet Take 100 mg by mouth daily.     sertraline (ZOLOFT) 100 MG tablet Take 1 tablet (100 mg total) by mouth daily. 90 tablet 1   vitamin C (ASCORBIC ACID) 500 MG tablet Take 500 mg by mouth daily.     vitamin E 400 UNIT capsule Take 400 Units by mouth daily.     No current facility-administered medications for this visit.    PHYSICAL EXAMINATION: ECOG PERFORMANCE STATUS: 0 - Asymptomatic  BP 133/79 (BP Location: Right Arm, Patient Position: Sitting, Cuff Size: Normal)   Pulse 73   Temp (!) 96.9 F (36.1 C) (Tympanic)   Ht 5\' 6"  (1.676 m)   Wt 165 lb (74.8 kg)   SpO2 100%   BMI 26.63 kg/m   Filed Weights   02/02/23 1412  Weight: 165 lb (74.8 kg)   Physical Exam HENT:     Head: Normocephalic and atraumatic.     Mouth/Throat:     Pharynx: No oropharyngeal exudate.  Eyes:     Pupils: Pupils are equal, round, and reactive to light.  Cardiovascular:     Rate and Rhythm: Normal rate and regular rhythm.  Pulmonary:     Effort: No respiratory distress.     Breath sounds: No wheezing.  Abdominal:     General: Bowel sounds are normal. There is no distension.     Palpations: Abdomen is soft. There is no mass.     Tenderness: There is no abdominal tenderness. There is no guarding or rebound.  Musculoskeletal:        General: No tenderness. Normal range of motion.     Cervical back: Normal range of motion and neck supple.  Skin:    General: Skin is warm.  Neurological:     Mental Status: She is alert and oriented to person, place, and time.  Psychiatric:  Mood and Affect: Affect normal.      LABORATORY DATA:  I have reviewed the data as listed    Component  Value Date/Time   NA 140 02/02/2023 1426   NA 139 08/23/2022 1635   NA 140 12/23/2012 0949   K 4.2 02/02/2023 1426   K 4.7 12/23/2012 0949   CL 105 02/02/2023 1426   CL 101 12/23/2012 0949   CO2 27 02/02/2023 1426   CO2 30 12/23/2012 0949   GLUCOSE 96 02/02/2023 1426   GLUCOSE 168 (H) 12/23/2012 0949   BUN 10 02/02/2023 1426   BUN 15 08/23/2022 1635   BUN 9 12/23/2012 0949   CREATININE 0.92 02/02/2023 1426   CREATININE 0.92 09/04/2014 0916   CALCIUM 9.5 02/02/2023 1426   CALCIUM 9.8 12/23/2012 0949   PROT 7.2 02/02/2023 1426   PROT 7.1 08/23/2022 1635   PROT 7.4 09/04/2014 0916   ALBUMIN 4.2 02/02/2023 1426   ALBUMIN 4.7 08/23/2022 1635   ALBUMIN 3.7 09/04/2014 0916   AST 18 02/02/2023 1426   ALT 16 02/02/2023 1426   ALT 34 09/04/2014 0916   ALKPHOS 61 02/02/2023 1426   ALKPHOS 85 09/04/2014 0916   BILITOT 0.4 02/02/2023 1426   GFRNONAA >60 02/02/2023 1426   GFRNONAA >60 09/04/2014 0916   GFRNONAA >60 02/18/2014 1048   GFRAA 83 05/31/2020 1430   GFRAA >60 09/04/2014 0916   GFRAA >60 02/18/2014 1048    No results found for: "SPEP", "UPEP"  Lab Results  Component Value Date   WBC 5.5 02/02/2023   NEUTROABS 3.1 02/02/2023   HGB 14.2 02/02/2023   HCT 42.2 02/02/2023   MCV 90.4 02/02/2023   PLT 238 02/02/2023      Chemistry      Component Value Date/Time   NA 140 02/02/2023 1426   NA 139 08/23/2022 1635   NA 140 12/23/2012 0949   K 4.2 02/02/2023 1426   K 4.7 12/23/2012 0949   CL 105 02/02/2023 1426   CL 101 12/23/2012 0949   CO2 27 02/02/2023 1426   CO2 30 12/23/2012 0949   BUN 10 02/02/2023 1426   BUN 15 08/23/2022 1635   BUN 9 12/23/2012 0949   CREATININE 0.92 02/02/2023 1426   CREATININE 0.92 09/04/2014 0916      Component Value Date/Time   CALCIUM 9.5 02/02/2023 1426   CALCIUM 9.8 12/23/2012 0949   ALKPHOS 61 02/02/2023 1426   ALKPHOS 85 09/04/2014 0916   AST 18 02/02/2023 1426   ALT 16 02/02/2023 1426   ALT 34 09/04/2014 0916   BILITOT 0.4  02/02/2023 1426       ASSESSMENT & PLAN:   Carcinoma of upper-outer quadrant of right breast in female, estrogen receptor positive (HCC) # 2017 right breast cancer stage I ER/PR positive HER-2/neu negative status post lumpectomy. No chemotherapy/no onotype.G-1 Status post adjuvant radiation.  Stable .   # Mammo- AUg 2023- WNL [Dr.Byrnett]. Discontinued Letrozole in JAN 2023 .  [breast cancer index is low risk of recurrence ].   # BMD: May 2022- T-score of -0.4. continue calcium and vitamin D.  stable.will order in 2025.    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  No evidence of any malignancy on imaging.stable.   # DISPOSITION:  # Bilateral mammogram  # Follow up in 12  months/labs-cbc/cmp/ca-27-29;- MD- dr.B        Earna Coder, MD 02/02/2023 4:05 PM

## 2023-02-02 NOTE — Assessment & Plan Note (Addendum)
Recent fall with facial trauma. Difficulty adjusting to walker use. No loss of consciousness during fall. Tenderness in right thorax. -acute  -Order chest x-ray to rule out rib fracture or other injury. -Encourage consistent use of walker for mobility and safety. ecchymosis on exam

## 2023-02-02 NOTE — Patient Instructions (Addendum)
Please continue to take your medications as prescribed and maintain your current diet.   Remember to consistently use your walker for mobility and safety.   If you have any concerns or if your condition changes, please contact the office.  Please report to [facility] located at:  [address]  [place], [place] [address]  You do not need an appointment to have xrays completed.   Our office will follow up with  results once available.   I have increased the Zoloft to 100mg  daily, please continue the Wellbutrin 300mg  daily   I have also sent in refills for your Plavix

## 2023-02-03 LAB — CANCER ANTIGEN 27.29: CA 27.29: 14.9 U/mL (ref 0.0–38.6)

## 2023-02-05 ENCOUNTER — Other Ambulatory Visit: Payer: Self-pay

## 2023-02-05 ENCOUNTER — Other Ambulatory Visit: Payer: Self-pay | Admitting: Family Medicine

## 2023-02-05 DIAGNOSIS — E119 Type 2 diabetes mellitus without complications: Secondary | ICD-10-CM

## 2023-02-09 ENCOUNTER — Ambulatory Visit
Admission: RE | Admit: 2023-02-09 | Discharge: 2023-02-09 | Disposition: A | Discharge status: Home / Self Care | Payer: Medicare HMO | Source: Ambulatory Visit | Attending: Family Medicine | Admitting: Family Medicine

## 2023-02-09 ENCOUNTER — Ambulatory Visit
Admission: RE | Admit: 2023-02-09 | Discharge: 2023-02-09 | Disposition: A | Discharge status: Home / Self Care | Payer: Medicare HMO | Attending: Family Medicine | Admitting: Family Medicine

## 2023-02-09 DIAGNOSIS — R0789 Other chest pain: Secondary | ICD-10-CM | POA: Diagnosis not present

## 2023-02-09 DIAGNOSIS — W19XXXA Unspecified fall, initial encounter: Secondary | ICD-10-CM | POA: Insufficient documentation

## 2023-02-09 DIAGNOSIS — S299XXD Unspecified injury of thorax, subsequent encounter: Secondary | ICD-10-CM | POA: Diagnosis not present

## 2023-03-05 ENCOUNTER — Ambulatory Visit: Payer: Medicare HMO | Admitting: Family Medicine

## 2023-03-13 DIAGNOSIS — I779 Disorder of arteries and arterioles, unspecified: Secondary | ICD-10-CM | POA: Diagnosis not present

## 2023-03-13 DIAGNOSIS — E118 Type 2 diabetes mellitus with unspecified complications: Secondary | ICD-10-CM | POA: Diagnosis not present

## 2023-03-13 DIAGNOSIS — R0789 Other chest pain: Secondary | ICD-10-CM | POA: Diagnosis not present

## 2023-03-13 DIAGNOSIS — E782 Mixed hyperlipidemia: Secondary | ICD-10-CM | POA: Diagnosis not present

## 2023-03-13 DIAGNOSIS — Z8673 Personal history of transient ischemic attack (TIA), and cerebral infarction without residual deficits: Secondary | ICD-10-CM | POA: Diagnosis not present

## 2023-03-13 DIAGNOSIS — I10 Essential (primary) hypertension: Secondary | ICD-10-CM | POA: Diagnosis not present

## 2023-03-26 ENCOUNTER — Other Ambulatory Visit: Payer: Self-pay | Admitting: Family Medicine

## 2023-03-26 ENCOUNTER — Ambulatory Visit
Admission: RE | Admit: 2023-03-26 | Discharge: 2023-03-26 | Disposition: A | Discharge status: Home / Self Care | Payer: Medicare HMO | Source: Ambulatory Visit | Attending: Internal Medicine | Admitting: Internal Medicine

## 2023-03-26 DIAGNOSIS — I10 Essential (primary) hypertension: Secondary | ICD-10-CM

## 2023-03-26 DIAGNOSIS — Z170 Estrogen receptor positive status [ER+]: Secondary | ICD-10-CM

## 2023-03-26 DIAGNOSIS — C50411 Malignant neoplasm of upper-outer quadrant of right female breast: Secondary | ICD-10-CM | POA: Insufficient documentation

## 2023-03-26 DIAGNOSIS — Z1231 Encounter for screening mammogram for malignant neoplasm of breast: Secondary | ICD-10-CM | POA: Insufficient documentation

## 2023-03-29 NOTE — Progress Notes (Signed)
Established patient visit   Patient: Leah Schaefer   DOB: 09/15/1940   82 y.o. Female  MRN: [id number] Visit Date: 04/05/2023  Today's healthcare provider: [name], MD   Chief Complaint  Patient presents with   Follow-up    Check lower part of back having back pain. Pain ever since the fall in April   Subjective     HPI     Follow-up    Additional comments: Check lower part of back having back pain. Pain ever since the fall in April      Last edited by Clois Comber on 04/05/2023  3:41 PM.       Discussed the use of AI scribe software for clinical note transcription with the patient, who gave verbal consent to proceed.  History of Present Illness   The patient, with a history of stroke and mobility issues, has been non-compliant with the use of her walker, leading to a recent fall where she landed on her shoulder. The patient admits to frequently leaving her walker behind, leading to instances of unassisted ambulation around the house. She expresses difficulty with the walker, citing it as cumbersome and causing her to bump into furniture.  The patient's daughter, who is her primary caregiver, expresses frustration with the patient's non-compliance with the walker and her lack of engagement in prescribed exercises. The patient acknowledges her daughter's concerns but admits to struggling with accepting her physical limitations post-stroke.  The patient also reports feelings of depression and frustration with her current living situation and physical limitations. She expresses a desire for independence and a struggle with accepting help from her children. The patient acknowledges the need for safety precautions but expresses difficulty in adapting to the use of the walker.  The patient's mood appears to be affected by her physical health and living situation, with the patient admitting to feelings of depression. She expresses feelings of isolation and  frustration with her inability to do things she used to do. The patient acknowledges the need for help but struggles with accepting it.  The patient's physical health and safety are a significant concern due to her non-compliance with the use of her walker and her recent fall. The patient's mental health is also a concern due to her reported feelings of depression and frustration with her current situation.      Patient reports that she fell in May while trying to vacuum  She reports having lower back pain  She is having lumbar right sided back pain that is worse with standing and moving  Better with sitting  She denies bladder or bowel incontinence  She reports that she has mild weakness in her legs that she believes is related to not exercising as she would like  She denies radiculopathy symptoms     Medications: Outpatient Medications Prior to Visit  Medication Sig   fluticasone (FLONASE) 50 MCG/ACT nasal spray Place 2 sprays into both nostrils daily.   gabapentin (NEURONTIN) 100 MG capsule Take 100 mg by mouth 3 (three) times daily.   latanoprost (XALATAN) 0.005 % ophthalmic solution Place 1 drop into both eyes at bedtime.    losartan (COZAAR) 50 MG tablet TAKE 1 TABLET EVERY DAY   metFORMIN (GLUCOPHAGE) 1000 MG tablet TAKE 1 TABLET TWICE DAILY WITH MEALS   metoprolol tartrate (LOPRESSOR) 25 MG tablet TAKE 1/2 TABLET TWICE DAILY   Multiple Vitamin (MULTIVITAMIN WITH MINERALS) TABS tablet Take 1 tablet by mouth daily.   ondansetron (  ZOFRAN) 4 MG tablet Take 1 tablet (4 mg total) by mouth every 8 (eight) hours as needed for nausea or vomiting.   ondansetron (ZOFRAN-ODT) 8 MG disintegrating tablet Take 1 tablet (8 mg total) by mouth every 8 (eight) hours as needed for nausea or vomiting.   pantoprazole (PROTONIX) 40 MG tablet Take 1 tablet (40 mg total) by mouth daily.   pyridoxine (B-6) 100 MG tablet Take 100 mg by mouth daily.   sertraline (ZOLOFT) 100 MG tablet Take 1 tablet (100 mg  total) by mouth daily.   vitamin C (ASCORBIC ACID) 500 MG tablet Take 500 mg by mouth daily.   vitamin E 400 UNIT capsule Take 400 Units by mouth daily.   ACCU-CHEK AVIVA PLUS test strip CHECK  FASTING  BLOOD  GLUCOSE EVERY DAY   Accu-Chek Softclix Lancets lancets TEST BLOOD SUGAR EVERY DAY   acetaminophen (TYLENOL) 500 MG tablet Take 500 mg by mouth every 6 (six) hours as needed for mild pain or headache.    atorvastatin (LIPITOR) 40 MG tablet Take 1 tablet (40 mg total) by mouth every evening.   buPROPion (WELLBUTRIN XL) 300 MG 24 hr tablet Take 1 tablet (300 mg total) by mouth daily.   Calcium Carb-Cholecalciferol 600-800 MG-UNIT TABS Take 1 tablet by mouth daily.   cholecalciferol (VITAMIN D) 1000 units tablet Take 1,000 Units by mouth daily.   clopidogrel (PLAVIX) 75 MG tablet Take 1 tablet (75 mg total) by mouth daily.   cyanocobalamin 500 MCG tablet Take 500 mcg by mouth daily.   donepezil (ARICEPT) 5 MG tablet Take 5 mg by mouth at bedtime.   No facility-administered medications prior to visit.    Review of Systems      Objective    BP 121/70 (BP Location: Right Arm, Patient Position: Sitting, Cuff Size: Normal)   Pulse 73   Ht 5\' 6"  (1.676 m)   Wt 152 lb 4.8 oz (69.1 kg)   SpO2 99%   BMI 24.58 kg/m     Physical Exam Vitals reviewed.  Constitutional:      General: She is not in acute distress.    Appearance: Normal appearance. She is not ill-appearing, toxic-appearing or diaphoretic.  Eyes:     Conjunctiva/sclera: Conjunctivae normal.  Cardiovascular:     Rate and Rhythm: Normal rate and regular rhythm.     Pulses: Normal pulses.     Heart sounds: Normal heart sounds. No murmur heard.    No friction rub. No gallop.  Pulmonary:     Effort: Pulmonary effort is normal. No respiratory distress.     Breath sounds: Normal breath sounds. No stridor. No wheezing, rhonchi or rales.  Abdominal:     General: Bowel sounds are normal. There is no distension.      Palpations: Abdomen is soft.     Tenderness: There is no abdominal tenderness.  Musculoskeletal:     Lumbar back: Tenderness present. No bony tenderness. Normal range of motion. Negative right straight leg raise test and negative left straight leg raise test. No scoliosis.     Right lower leg: No edema.     Left lower leg: No edema.     Comments: Tenderness in right lumbar region  No erythema nor changes to overlying skin  No deformities of the back noted on exam    Skin:    Findings: No erythema or rash.  Neurological:     Mental Status: She is alert and oriented to person, place, and time.  No results found for any visits on 04/05/23.  Assessment & Plan     Problem List Items Addressed This Visit   None Visit Diagnoses     Pain in right lumbar region of back    -  Primary   Relevant Orders   DG Lumbar Spine Complete   Depression, recurrent (HCC)       Relevant Orders   Ambulatory referral to Psychiatry           Fall Risk Patient is not consistently using her walker, leading to increased risk of falls. Daughter is concerned about patient's safety and adherence to using the walker. Patient reports difficulty maneuvering the walker and often leaves it behind. -Encourage consistent use of walker to prevent falls. -Consider home health referral for additional support and safety measures.  Depression Patient reports feelings of depression and frustration due to her current health situation and limitations. She expresses feelings of loneliness and a desire for more independence. -Refer to psychiatry for evaluation and management of depressive symptoms. -Reach out to social work for assistance with activities and social interaction opportunities for the patient.  Caregiver Stress Daughter expresses frustration and stress related to caregiving responsibilities. She is concerned about patient's safety and adherence to using the walker. -Encourage open  communication between patient and caregiver. -Consider resources for caregiver support and respite care.         Return in about 1 month (around 05/06/2023) for Depression.         [name], MD  [facility] [phone] (phone) [phone] (fax)  [facility]

## 2023-04-05 ENCOUNTER — Ambulatory Visit (INDEPENDENT_AMBULATORY_CARE_PROVIDER_SITE_OTHER): Payer: Medicare HMO | Admitting: Family Medicine

## 2023-04-05 ENCOUNTER — Encounter: Payer: Self-pay | Admitting: Family Medicine

## 2023-04-05 VITALS — BP 121/70 | HR 73 | Ht 66.0 in | Wt 152.3 lb

## 2023-04-05 DIAGNOSIS — F339 Major depressive disorder, recurrent, unspecified: Secondary | ICD-10-CM | POA: Diagnosis not present

## 2023-04-05 DIAGNOSIS — M545 Low back pain, unspecified: Secondary | ICD-10-CM

## 2023-04-05 NOTE — Patient Instructions (Addendum)
A referral has been placed on your behalf for psychiatry as well as clinical social work. Our referral coordination team or the office you will be visiting will contact you within the next 2 weeks.  If you have not received a phone call within 10 business days please let us know so that we can check into this for you.    VISIT SUMMARY:  During our visit, we discussed your recent fall and your struggle with using your walker consistently. We also talked about your feelings of depression and frustration due to your current health situation and limitations. Your daughter, who is your primary caregiver, also expressed her concerns and stress related to her caregiving responsibilities.  YOUR PLAN:  -FALL RISK: You are at a higher risk of falling because you are not consistently using your walker. To prevent falls, it's important that you use your walker consistently. We are also considering referring you to a home health service for additional support and safety measures.  -DEPRESSION: You've shared feelings of depression and frustration due to your current health situation and limitations. We are referring you to a psychiatrist who can help manage these feelings. We will also reach out to a social worker who can help with activities and social interaction opportunities.  -CAREGIVER STRESS: Your daughter, who is your primary caregiver, is feeling stressed and concerned about your safety. We encourage open communication between you and your daughter. We are also considering resources for caregiver support and respite care.  INSTRUCTIONS:  Please make sure to use your walker consistently to prevent falls. We will be referring you to a psychiatrist and a social worker to help manage your feelings of depression and to provide social interaction opportunities. We also encourage open communication between you and your daughter to address any concerns or issues.

## 2023-04-06 ENCOUNTER — Ambulatory Visit
Admission: RE | Admit: 2023-04-06 | Discharge: 2023-04-06 | Disposition: A | Discharge status: Home / Self Care | Payer: Medicare HMO | Attending: Family Medicine | Admitting: Family Medicine

## 2023-04-06 ENCOUNTER — Ambulatory Visit
Admission: RE | Admit: 2023-04-06 | Discharge: 2023-04-06 | Disposition: A | Discharge status: Home / Self Care | Payer: Medicare HMO | Source: Ambulatory Visit | Attending: Family Medicine | Admitting: Family Medicine

## 2023-04-06 DIAGNOSIS — M47816 Spondylosis without myelopathy or radiculopathy, lumbar region: Secondary | ICD-10-CM | POA: Diagnosis not present

## 2023-04-06 DIAGNOSIS — M545 Low back pain, unspecified: Secondary | ICD-10-CM | POA: Insufficient documentation

## 2023-04-06 DIAGNOSIS — M438X6 Other specified deforming dorsopathies, lumbar region: Secondary | ICD-10-CM | POA: Diagnosis not present

## 2023-04-12 ENCOUNTER — Telehealth: Payer: Self-pay | Admitting: *Deleted

## 2023-04-12 NOTE — Patient Outreach (Signed)
  Care Coordination   Initial Visit Note   04/12/2023 Name: Leah Schaefer MRN: [id number] DOB: [birth year]  Leah Schaefer is a 82 y.o. year old female who sees Simmons-Robinson, Tawanna Cooler, MD for primary care. I spoke with  Teodora Medici Clum's daughter by phone today.  What matters to the patients health and wellness today?  Mental health follow up and community resource needs.     Goals Addressed             This Visit's Progress    Community resource needs       Interventions Today    Flowsheet Row Most Recent Value  Chronic Disease   Chronic disease during today's visit Other  [depression]  General Interventions   General Interventions Discussed/Reviewed General Interventions Discussed, [employer], Doctor Visits, Level of Care  [assessed patient for community resource needs-discussed options to increase patient's socialization]  Doctor Visits Discussed/Reviewed Doctor Visits Reviewed, PCP  [facility] 8/29 AWV 10/31/22]  Level of Care Adult Daycare  [Discussed Adult Day program options [facility]-daughter has fears of pt falling due to unsteady gait-willing to consider following consult with psychiatry]  Mental Health Interventions   Mental Health Discussed/Reviewed Mental Health Discussed, Depression, Grief and Loss  [patient's daughter confirmed that patient has opportunities for socialization however often declines -concerned that lack of motivation may be resulting from patient's challenges with depression-MD has placed referral for psychiatry-CSW to F/U]  Safety Interventions   Safety Discussed/Reviewed Safety Discussed, Home Safety  Home Safety Assistive Devices  [use of assistive devices when ambulating encouraged, daughter lives next door and is accessble to patient - daughter also to install cameras in the home for additional safety]              SDOH assessments and interventions completed:  Yes  SDOH Interventions Today     Flowsheet Row Most Recent Value  SDOH Interventions   Food Insecurity Interventions Intervention Not Indicated  Housing Interventions Intervention Not Indicated  Transportation Interventions Intervention Not Indicated        Care Coordination Interventions:  Yes, provided   Follow up plan: Follow up call scheduled for 04/18/23    Encounter Outcome:  Patient Visit Completed

## 2023-04-12 NOTE — Patient Instructions (Signed)
Visit Information  Thank you for taking time to visit with me today. Please don't hesitate to contact me if I can be of assistance to you.   Following are the goals we discussed today:   CSW to follow up with with referral to [facility] CSW to provide patient's daughter with community resources as needed to increase socialization   Our next appointment is by telephone on 04/18/23 at 10am  Please call the care guide team at [phone] if you need to cancel or reschedule your appointment.   If you are experiencing a Mental Health or Behavioral Health Crisis or need someone to talk to, please call 911   Patient verbalizes understanding of instructions and care plan provided today and agrees to view in MyChart. Active MyChart status and patient understanding of how to access instructions and care plan via MyChart confirmed with patient.     Telephone follow up appointment with care management team member scheduled for:   Zeriyah Wain, LCSW [place]  [facility], [facility] Licensed Clinical Social Worker Care Coordinator  Direct Dial: [phone]

## 2023-04-18 ENCOUNTER — Ambulatory Visit: Payer: Self-pay | Admitting: *Deleted

## 2023-04-18 NOTE — Patient Instructions (Signed)
Visit Information  Thank you for taking time to visit with me today. Please don't hesitate to contact me if I can be of assistance to you.   Following are the goals we discussed today:  Please contact [facility] to schedule initial appointment for mental health support Please continue to encourage patient to utilize her assistive devices when ambulating Please contact your provider with any questions regarding your medical needs   If you are experiencing a Mental Health or Behavioral Health Crisis or need someone to talk to, please call 911   Patient verbalizes understanding of instructions and care plan provided today and agrees to view in MyChart. Active MyChart status and patient understanding of how to access instructions and care plan via MyChart confirmed with patient.     No further follow up required: Patient's to contact this Child psychotherapist with any additional community resource needs     [employer], [employer] [place]  [facility], [facility] Licensed Clinical Social Geologist, engineering Dial: [phone]

## 2023-04-18 NOTE — Patient Outreach (Signed)
  Care Coordination   Follow Up Visit Note   04/18/2023 Name: Leah Schaefer MRN: [id number] DOB: October 20, 1940  Leah Schaefer is a 82 y.o. year old female who sees Simmons-Robinson, Tawanna Cooler, MD for primary care. I spoke with  Teodora Medici Files's daughter by phone today.  What matters to the patients health and wellness today?  Patient's daughter confirmed that patient's mood and energy level has improved. Daughter will contact [facility] to schedule initial appointment for ongoing follow up. Patient's daughter verbalized having no additional care coordination needs at this time.   Goals Addressed             This Visit's Progress    Community resource needs       Interventions Today    Flowsheet Row Most Recent Value  Chronic Disease   Chronic disease during today's visit Other  [depression]  General Interventions   General Interventions Discussed/Reviewed General Interventions Reviewed, Community Resources, Level of Care  Level of Care Adult Daycare  [[facility] re-visited-patient / daughter have declined due to risk of falls, Program contacted and confirmed that an aid would not be able to accompany pt daily due to space constraints-confirmed that trained staff are available on site]  Mental Health Interventions   Mental Health Discussed/Reviewed Mental Health Reviewed, Depression  [pt's daughter states that pt's mood/ activity level  have improved. Socialization with family has improved, adherant to med-referral to psych in place, contact info provided to pt's daughter to sched initial appt. confirmed currently booking out 3-4 mo.]  Safety Interventions   Safety Discussed/Reviewed Safety Reviewed  Home Safety Assistive Devices  [daughter confirms continued encouragement for consistent use of assistive devices to avoid falls]              SDOH assessments and interventions completed:  No     Care Coordination Interventions:  Yes,  provided   Follow up plan: No further intervention required.   Encounter Outcome:  Patient Visit Completed

## 2023-04-19 ENCOUNTER — Other Ambulatory Visit: Payer: Self-pay | Admitting: Family Medicine

## 2023-04-19 DIAGNOSIS — K219 Gastro-esophageal reflux disease without esophagitis: Secondary | ICD-10-CM

## 2023-05-05 ENCOUNTER — Other Ambulatory Visit: Payer: Self-pay | Admitting: Family Medicine

## 2023-05-05 DIAGNOSIS — I10 Essential (primary) hypertension: Secondary | ICD-10-CM

## 2023-05-07 NOTE — Telephone Encounter (Signed)
Requested Prescriptions  Pending Prescriptions Disp Refills   metoprolol tartrate (LOPRESSOR) 25 MG tablet [Pharmacy Med Name: Metoprolol Tartrate Oral Tablet 25 MG] 90 tablet 0    Sig: TAKE 1/2 TABLET TWICE DAILY     Cardiovascular:  Beta Blockers Passed - 05/05/2023  2:57 AM      Passed - Last BP in normal range    BP Readings from Last 1 Encounters:  04/05/23 121/70         Passed - Last Heart Rate in normal range    Pulse Readings from Last 1 Encounters:  04/05/23 73         Passed - Valid encounter within last 6 months    Recent Outpatient Visits           1 month ago Pain in right lumbar region of back   [facility] Simmons-Robinson, [place], MD   3 months ago Depressed affect   [place] [facility] [place], [place], MD   4 months ago Urinary symptom or sign   [place] [facility] Simmons-Robinson, [place], MD   5 months ago Urinary symptom or sign   [facility] [place], Monico Blitz, DO   6 months ago Dysfunction of left eustachian tube   [place] [facility] Simmons-Robinson, Tawanna Cooler, MD       Future Appointments             In 2 months Stoioff, Verna Czech, MD [facility] [place]

## 2023-05-28 ENCOUNTER — Other Ambulatory Visit: Payer: Self-pay | Admitting: Family Medicine

## 2023-05-28 DIAGNOSIS — R4589 Other symptoms and signs involving emotional state: Secondary | ICD-10-CM

## 2023-05-29 NOTE — Telephone Encounter (Signed)
Requested by interface surescripts.  Requested Prescriptions  Pending Prescriptions Disp Refills   sertraline (ZOLOFT) 100 MG tablet [Pharmacy Med Name: Sertraline HCl Oral Tablet 100 MG] 90 tablet 1    Sig: TAKE 1 TABLET EVERY DAY     Psychiatry:  Antidepressants - SSRI - sertraline Passed - 05/28/2023  1:27 PM      Passed - AST in normal range and within 360 days    AST  Date Value Ref Range Status  02/02/2023 18 15 - 41 U/L Final         Passed - ALT in normal range and within 360 days    ALT  Date Value Ref Range Status  02/02/2023 16 0 - 44 U/L Final   SGPT (ALT)  Date Value Ref Range Status  09/04/2014 34 14 - 63 U/L Final         Passed - Completed PHQ-2 or PHQ-9 in the last 360 days      Passed - Valid encounter within last 6 months    Recent Outpatient Visits           1 month ago Pain in right lumbar region of back   [facility] Simmons-Robinson, [place], MD   3 months ago Depressed affect   [place] [facility] [place], [place], MD   5 months ago Urinary symptom or sign   [place] [facility] Simmons-Robinson, [place], MD   5 months ago Urinary symptom or sign   [facility] [place], Sarah N, DO   7 months ago Dysfunction of left eustachian tube   [place] [facility] Simmons-Robinson, Tawanna Cooler, MD       Future Appointments             In 1 month Stoioff, Verna Czech, MD [facility] [place]

## 2023-05-30 DIAGNOSIS — R296 Repeated falls: Secondary | ICD-10-CM | POA: Diagnosis not present

## 2023-05-30 DIAGNOSIS — F0280 Dementia in other diseases classified elsewhere without behavioral disturbance: Secondary | ICD-10-CM | POA: Diagnosis not present

## 2023-05-30 DIAGNOSIS — F32A Depression, unspecified: Secondary | ICD-10-CM | POA: Diagnosis not present

## 2023-05-30 DIAGNOSIS — G309 Alzheimer's disease, unspecified: Secondary | ICD-10-CM | POA: Diagnosis not present

## 2023-05-30 DIAGNOSIS — F0150 Vascular dementia without behavioral disturbance: Secondary | ICD-10-CM | POA: Diagnosis not present

## 2023-05-30 DIAGNOSIS — I6930 Unspecified sequelae of cerebral infarction: Secondary | ICD-10-CM | POA: Diagnosis not present

## 2023-05-30 DIAGNOSIS — G5793 Unspecified mononeuropathy of bilateral lower limbs: Secondary | ICD-10-CM | POA: Diagnosis not present

## 2023-06-01 DIAGNOSIS — F03A3 Unspecified dementia, mild, with mood disturbance: Secondary | ICD-10-CM | POA: Diagnosis not present

## 2023-06-01 DIAGNOSIS — I10 Essential (primary) hypertension: Secondary | ICD-10-CM | POA: Diagnosis not present

## 2023-06-01 DIAGNOSIS — M545 Low back pain, unspecified: Secondary | ICD-10-CM | POA: Diagnosis not present

## 2023-06-01 DIAGNOSIS — G5793 Unspecified mononeuropathy of bilateral lower limbs: Secondary | ICD-10-CM | POA: Diagnosis not present

## 2023-06-01 DIAGNOSIS — G8929 Other chronic pain: Secondary | ICD-10-CM | POA: Diagnosis not present

## 2023-06-01 DIAGNOSIS — E119 Type 2 diabetes mellitus without complications: Secondary | ICD-10-CM | POA: Diagnosis not present

## 2023-06-01 DIAGNOSIS — F03A4 Unspecified dementia, mild, with anxiety: Secondary | ICD-10-CM | POA: Diagnosis not present

## 2023-06-01 DIAGNOSIS — R4185 Anosognosia: Secondary | ICD-10-CM | POA: Diagnosis not present

## 2023-06-01 DIAGNOSIS — I998 Other disorder of circulatory system: Secondary | ICD-10-CM | POA: Diagnosis not present

## 2023-06-02 DIAGNOSIS — R3989 Other symptoms and signs involving the genitourinary system: Secondary | ICD-10-CM | POA: Diagnosis not present

## 2023-06-04 DIAGNOSIS — F03A3 Unspecified dementia, mild, with mood disturbance: Secondary | ICD-10-CM | POA: Diagnosis not present

## 2023-06-04 DIAGNOSIS — E119 Type 2 diabetes mellitus without complications: Secondary | ICD-10-CM | POA: Diagnosis not present

## 2023-06-04 DIAGNOSIS — I998 Other disorder of circulatory system: Secondary | ICD-10-CM | POA: Diagnosis not present

## 2023-06-04 DIAGNOSIS — R4185 Anosognosia: Secondary | ICD-10-CM | POA: Diagnosis not present

## 2023-06-04 DIAGNOSIS — G5793 Unspecified mononeuropathy of bilateral lower limbs: Secondary | ICD-10-CM | POA: Diagnosis not present

## 2023-06-04 DIAGNOSIS — I10 Essential (primary) hypertension: Secondary | ICD-10-CM | POA: Diagnosis not present

## 2023-06-04 DIAGNOSIS — G8929 Other chronic pain: Secondary | ICD-10-CM | POA: Diagnosis not present

## 2023-06-04 DIAGNOSIS — F03A4 Unspecified dementia, mild, with anxiety: Secondary | ICD-10-CM | POA: Diagnosis not present

## 2023-06-04 DIAGNOSIS — M545 Low back pain, unspecified: Secondary | ICD-10-CM | POA: Diagnosis not present

## 2023-06-05 DIAGNOSIS — E119 Type 2 diabetes mellitus without complications: Secondary | ICD-10-CM | POA: Diagnosis not present

## 2023-06-05 DIAGNOSIS — F03A4 Unspecified dementia, mild, with anxiety: Secondary | ICD-10-CM | POA: Diagnosis not present

## 2023-06-05 DIAGNOSIS — I10 Essential (primary) hypertension: Secondary | ICD-10-CM | POA: Diagnosis not present

## 2023-06-05 DIAGNOSIS — G5793 Unspecified mononeuropathy of bilateral lower limbs: Secondary | ICD-10-CM | POA: Diagnosis not present

## 2023-06-05 DIAGNOSIS — G8929 Other chronic pain: Secondary | ICD-10-CM | POA: Diagnosis not present

## 2023-06-05 DIAGNOSIS — R4185 Anosognosia: Secondary | ICD-10-CM | POA: Diagnosis not present

## 2023-06-05 DIAGNOSIS — F03A3 Unspecified dementia, mild, with mood disturbance: Secondary | ICD-10-CM | POA: Diagnosis not present

## 2023-06-05 DIAGNOSIS — M545 Low back pain, unspecified: Secondary | ICD-10-CM | POA: Diagnosis not present

## 2023-06-05 DIAGNOSIS — I998 Other disorder of circulatory system: Secondary | ICD-10-CM | POA: Diagnosis not present

## 2023-06-07 ENCOUNTER — Ambulatory Visit: Payer: Self-pay

## 2023-06-07 ENCOUNTER — Other Ambulatory Visit: Payer: Self-pay | Admitting: Family Medicine

## 2023-06-07 DIAGNOSIS — I10 Essential (primary) hypertension: Secondary | ICD-10-CM

## 2023-06-07 NOTE — Telephone Encounter (Signed)
Chief Complaint: Upper abdominal pain Symptoms: vomiting after eating at night Frequency: Onset past 2 days Pertinent Negatives: Patient denies other symptoms Disposition: [] ED /[] Urgent Care (no appt availability in office) / [x] Appointment(In office/virtual)/ []  [place] [facility]/ [] Home Care/ [] Refused Recommended Disposition /[] [place] [facility]/ []  Follow-up with PCP Additional Notes: Patient's daughter [name] called and says her mom was seen by GI a few months ago and told she has a hiatal hernia. She says the mom has upper abdominal pain and vomits after dinner meal at night for past 2 days. Advised patient is currently on pantoprazole and that is used for acid reflux as well as prilosec, so she doesn't need to take the prilosec. Advised to give the pantoprazole 30 min-1 hour before the meal for the medication to work effectively, even though it's typically used as a daily medication. She says she normally give it 12 hours apart, first around 0830-0900 when mom wakes up, then give breakfast, and the evening 12 hours from that. Advised that's ok to do, but ensure the first is given prior to eating as it works for the 24 hours decreasing the acid. Advised to give tums or Mylanta after the evening meal to help with the acid buildup from the meal as well as the pantoprazole as prescribed. Scheduled OV with PCP for tomorrow.     Summary: Medication question   Patient's daughter called to see if it was ok for patient to take otc Prilosec with the prescription medications she is on. Please advise.     Reason for Disposition  [1] MODERATE pain (e.g., interferes with normal activities) AND [2] comes and goes (cramps) AND [3] present > 24 hours  (Exception: Pain with Vomiting or Diarrhea - see that Guideline.)  Answer Assessment - Initial Assessment Questions 1. LOCATION: "Where does it hurt?"      Upper abdomen, she has hiatal hernia 2. RADIATION: "Does the pain shoot anywhere else?"  (e.g., chest, back)     Don't remember  3. ONSET: "When did the pain begin?" (e.g., minutes, hours or days ago)      Last 2 days 4. PATTERN "Does the pain come and go, or is it constant?"    - If it comes and goes: "How long does it last?" "Do you have pain now?"     (Note: Comes and goes means the pain is intermittent. It goes away completely between bouts.)    - If constant: "Is it getting better, staying the same, or getting worse?"      (Note: Constant means the pain never goes away completely; most serious pain is constant and gets worse.)      After eating 5. SEVERITY: "How bad is the pain?"  (e.g., Scale 1-10; mild, moderate, or severe)    - MILD (1-3): Doesn't interfere with normal activities, abdomen soft and not tender to touch..     - MODERATE (4-7): Interferes with normal activities or awakens from sleep, abdomen tender to touch.     - SEVERE (8-10): Excruciating pain, doubled over, unable to do any normal activities.       Unsure, not reported the severity of the pain 7. RECURRENT SYMPTOM: "Have you ever had this type of stomach pain before?" If Yes, ask: "When was the last time?" and "What happened that time?"      Saw GI a few months ago 8. AGGRAVATING FACTORS: "Does anything seem to cause this pain?" (e.g., foods, stress, alcohol)     Foods 9.  OTHER SYMPTOMS: "Do you have any other symptoms?" (e.g., back pain, diarrhea, fever, urination pain, vomiting)       Vomiting hours after eating  Protocols used: Abdominal Pain - Upper-A-AH

## 2023-06-07 NOTE — Telephone Encounter (Signed)
Requested medications are due for refill today.  yes  Requested medications are on the active medications list.  yes  Last refill. 12/27/2022 #90 1 rf  Future visit scheduled.   yes  Notes to clinic.  Labs are expired.    Requested Prescriptions  Pending Prescriptions Disp Refills   atorvastatin (LIPITOR) 40 MG tablet [Pharmacy Med Name: Atorvastatin Calcium Oral Tablet 40 MG] 90 tablet 3    Sig: TAKE 1 TABLET EVERY EVENING     Cardiovascular:  Antilipid - Statins Failed - 06/07/2023  2:53 AM      Failed - Lipid Panel in normal range within the last 12 months    Cholesterol, Total  Date Value Ref Range Status  10/14/2021 110 100 - 199 mg/dL Final   LDL Chol Calc (NIH)  Date Value Ref Range Status  10/14/2021 51 0 - 99 mg/dL Final   HDL  Date Value Ref Range Status  10/14/2021 33 (L) >39 mg/dL Final   Triglycerides  Date Value Ref Range Status  10/14/2021 151 (H) 0 - 149 mg/dL Final         Passed - Patient is not pregnant      Passed - Valid encounter within last 12 months    Recent Outpatient Visits           2 months ago Pain in right lumbar region of back   [place] [facility] Simmons-Robinson, [place], MD   4 months ago Depressed affect   [place] [facility] [place], [place], MD   5 months ago Urinary symptom or sign   [place] [facility] Simmons-Robinson, [place], MD   6 months ago Urinary symptom or sign   [facility] [place], Sarah N, DO   7 months ago Dysfunction of left eustachian tube   [place] [facility] Simmons-Robinson, [place], MD       Future Appointments             Tomorrow Simmons-Robinson, Tawanna Cooler, MD [facility]   In 1 month Stoioff, Verna Czech, MD [facility] [place]

## 2023-06-08 ENCOUNTER — Encounter: Payer: Self-pay | Admitting: Family Medicine

## 2023-06-08 ENCOUNTER — Ambulatory Visit: Payer: Medicare HMO | Admitting: Family Medicine

## 2023-06-08 VITALS — BP 102/68 | HR 74 | Ht 66.0 in | Wt 149.4 lb

## 2023-06-08 DIAGNOSIS — R1084 Generalized abdominal pain: Secondary | ICD-10-CM

## 2023-06-08 DIAGNOSIS — G8929 Other chronic pain: Secondary | ICD-10-CM | POA: Diagnosis not present

## 2023-06-08 DIAGNOSIS — B029 Zoster without complications: Secondary | ICD-10-CM | POA: Insufficient documentation

## 2023-06-08 DIAGNOSIS — M546 Pain in thoracic spine: Secondary | ICD-10-CM

## 2023-06-08 DIAGNOSIS — K219 Gastro-esophageal reflux disease without esophagitis: Secondary | ICD-10-CM

## 2023-06-08 MED ORDER — VALACYCLOVIR HCL 1 G PO TABS
1000.0000 mg | ORAL_TABLET | Freq: Three times a day (TID) | ORAL | 0 refills | Status: AC
Start: 2023-06-08 — End: 2023-06-15

## 2023-06-08 MED ORDER — TRAMADOL HCL 50 MG PO TABS
50.0000 mg | ORAL_TABLET | Freq: Every evening | ORAL | 0 refills | Status: AC | PRN
Start: 2023-06-08 — End: 2023-06-15
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
## 2023-06-08 NOTE — Patient Instructions (Signed)
VISIT SUMMARY:  During today's visit, we discussed your recent rash, episodes of vomiting, and chronic back pain. We also reviewed your current medications and physical therapy regimen.  YOUR PLAN:  -HERPES ZOSTER (SHINGLES): Herpes Zoster, also known as shingles, is a viral infection that causes a painful rash. Your rash is healing, and we will start you on Valtrex 1000mg  three times daily for 7 days to help with the recovery.  -GASTROESOPHAGEAL REFLUX DISEASE (GERD): GERD is a condition where stomach acid frequently flows back into the tube connecting your mouth and stomach. Continue taking Protonix 40mg  twice daily, 30 minutes before meals, and remain upright for at least 3 hours after eating to help manage your symptoms.  -CHRONIC BACK PAIN: Chronic back pain is long-lasting pain in the back that can be due to various reasons. We will start you on Tramadol 50mg  at bedtime as needed for pain relief.  -GENERAL HEALTH MAINTENANCE: We will postpone your flu vaccine due to your current illness. Please continue with your physical therapy regimen to help manage your back pain.  INSTRUCTIONS:  Please follow up with [place] if your symptoms do not improve or if you experience any new symptoms. Continue with your current medications and physical therapy, and take the new medications as prescribed.

## 2023-06-08 NOTE — Assessment & Plan Note (Signed)
Herpes Zoster (Shingles) Rash on back and face, crusted over and healing. No eye involvement noted. Acute  -Start Valtrex 1000mg  three times daily for 7 days. -advised waiting on influenza vaccine today, schedule flu clinic -recommended waiting at least 6 months before Shingrix vaccine

## 2023-06-08 NOTE — Assessment & Plan Note (Signed)
Both upper and lower back pain, no relief with Tylenol. -Start Tramadol 25-50mg  at bedtime as needed for pain. -advised against using tramadol and gabapentin simultaneously

## 2023-06-08 NOTE — Assessment & Plan Note (Signed)
  Recent episodes of vomiting, possibly related to lying down after eating. Currently on Protonix 40mg  twice daily. Chronic, recently worsened  -Continue Protonix 40mg  twice daily, taking 30 minutes before meals. -Advise to remain upright for at least 3 hours after eating.

## 2023-06-12 DIAGNOSIS — M545 Low back pain, unspecified: Secondary | ICD-10-CM | POA: Diagnosis not present

## 2023-06-12 DIAGNOSIS — I998 Other disorder of circulatory system: Secondary | ICD-10-CM | POA: Diagnosis not present

## 2023-06-12 DIAGNOSIS — R4185 Anosognosia: Secondary | ICD-10-CM | POA: Diagnosis not present

## 2023-06-12 DIAGNOSIS — G8929 Other chronic pain: Secondary | ICD-10-CM | POA: Diagnosis not present

## 2023-06-12 DIAGNOSIS — E119 Type 2 diabetes mellitus without complications: Secondary | ICD-10-CM | POA: Diagnosis not present

## 2023-06-12 DIAGNOSIS — F03A3 Unspecified dementia, mild, with mood disturbance: Secondary | ICD-10-CM | POA: Diagnosis not present

## 2023-06-12 DIAGNOSIS — G5793 Unspecified mononeuropathy of bilateral lower limbs: Secondary | ICD-10-CM | POA: Diagnosis not present

## 2023-06-12 DIAGNOSIS — I10 Essential (primary) hypertension: Secondary | ICD-10-CM | POA: Diagnosis not present

## 2023-06-12 DIAGNOSIS — F03A4 Unspecified dementia, mild, with anxiety: Secondary | ICD-10-CM | POA: Diagnosis not present

## 2023-06-13 DIAGNOSIS — M545 Low back pain, unspecified: Secondary | ICD-10-CM | POA: Diagnosis not present

## 2023-06-13 DIAGNOSIS — I10 Essential (primary) hypertension: Secondary | ICD-10-CM | POA: Diagnosis not present

## 2023-06-13 DIAGNOSIS — G5793 Unspecified mononeuropathy of bilateral lower limbs: Secondary | ICD-10-CM | POA: Diagnosis not present

## 2023-06-13 DIAGNOSIS — G8929 Other chronic pain: Secondary | ICD-10-CM | POA: Diagnosis not present

## 2023-06-13 DIAGNOSIS — F03A4 Unspecified dementia, mild, with anxiety: Secondary | ICD-10-CM | POA: Diagnosis not present

## 2023-06-13 DIAGNOSIS — E119 Type 2 diabetes mellitus without complications: Secondary | ICD-10-CM | POA: Diagnosis not present

## 2023-06-13 DIAGNOSIS — F03A3 Unspecified dementia, mild, with mood disturbance: Secondary | ICD-10-CM | POA: Diagnosis not present

## 2023-06-13 DIAGNOSIS — R4185 Anosognosia: Secondary | ICD-10-CM | POA: Diagnosis not present

## 2023-06-13 DIAGNOSIS — I998 Other disorder of circulatory system: Secondary | ICD-10-CM | POA: Diagnosis not present

## 2023-06-18 DIAGNOSIS — I998 Other disorder of circulatory system: Secondary | ICD-10-CM | POA: Diagnosis not present

## 2023-06-18 DIAGNOSIS — R4185 Anosognosia: Secondary | ICD-10-CM | POA: Diagnosis not present

## 2023-06-18 DIAGNOSIS — I10 Essential (primary) hypertension: Secondary | ICD-10-CM | POA: Diagnosis not present

## 2023-06-18 DIAGNOSIS — M545 Low back pain, unspecified: Secondary | ICD-10-CM | POA: Diagnosis not present

## 2023-06-18 DIAGNOSIS — E119 Type 2 diabetes mellitus without complications: Secondary | ICD-10-CM | POA: Diagnosis not present

## 2023-06-18 DIAGNOSIS — F03A4 Unspecified dementia, mild, with anxiety: Secondary | ICD-10-CM | POA: Diagnosis not present

## 2023-06-18 DIAGNOSIS — F03A3 Unspecified dementia, mild, with mood disturbance: Secondary | ICD-10-CM | POA: Diagnosis not present

## 2023-06-18 DIAGNOSIS — G5793 Unspecified mononeuropathy of bilateral lower limbs: Secondary | ICD-10-CM | POA: Diagnosis not present

## 2023-06-18 DIAGNOSIS — G8929 Other chronic pain: Secondary | ICD-10-CM | POA: Diagnosis not present

## 2023-06-20 DIAGNOSIS — I998 Other disorder of circulatory system: Secondary | ICD-10-CM | POA: Diagnosis not present

## 2023-06-20 DIAGNOSIS — R4185 Anosognosia: Secondary | ICD-10-CM | POA: Diagnosis not present

## 2023-06-20 DIAGNOSIS — F03A4 Unspecified dementia, mild, with anxiety: Secondary | ICD-10-CM | POA: Diagnosis not present

## 2023-06-20 DIAGNOSIS — E119 Type 2 diabetes mellitus without complications: Secondary | ICD-10-CM | POA: Diagnosis not present

## 2023-06-20 DIAGNOSIS — G8929 Other chronic pain: Secondary | ICD-10-CM | POA: Diagnosis not present

## 2023-06-20 DIAGNOSIS — M545 Low back pain, unspecified: Secondary | ICD-10-CM | POA: Diagnosis not present

## 2023-06-20 DIAGNOSIS — I10 Essential (primary) hypertension: Secondary | ICD-10-CM | POA: Diagnosis not present

## 2023-06-20 DIAGNOSIS — G5793 Unspecified mononeuropathy of bilateral lower limbs: Secondary | ICD-10-CM | POA: Diagnosis not present

## 2023-06-20 DIAGNOSIS — F03A3 Unspecified dementia, mild, with mood disturbance: Secondary | ICD-10-CM | POA: Diagnosis not present

## 2023-06-22 NOTE — Progress Notes (Unsigned)
Psychiatric Initial Adult Assessment   Patient Identification: LIESELOTTE HESSELTINE MRN:  [id number] Date of Evaluation:  06/25/2023 Referral Source: Brett Albino*  Chief Complaint:   Chief Complaint  Patient presents with   Establish Care   Visit Diagnosis:    ICD-10-CM   1. MDD (major depressive disorder), recurrent episode, mild (HCC)  F33.0 TSH    CANCELED: TSH    2. Major neurocognitive disorder (HCC)  F03.90 Folate    TSH    CANCELED: TSH    CANCELED: Folate      History of Present Illness:   MELAINE MACINTOSH is a 82 y.o. 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   According to the chart review, she is seen by Dr. Sherryll Burger, last in Oct 2024. She is prescribed the following-  gabapentin 200 mg twice a day, Donepezil 5 mg daily  She states that she is unsure why she is here as others made this appointment.  She feels confused on things, and put everything body else before herself.  She tends to be worried about others.  When she is asked to elaborate, she states that she is worried about her husband, her son, and her daughter when they were young.  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  She misses her husband every day.  She reports great relationship with him.  Although she used to take care of her grandchild, she is grown now.  She is "disinterested" in everything."She states that she does not spend time wisely and is not interested in doing things. She used to enjoy basket ball (when she was in high school).  She wants to do things as she used to such as cleaning the house.  She does not cook anymore.  Although she used to do crossword puzzle, go  to senior center, swimming, waterobic, she has not done it for the past few years.   Depression- The patient has mood symptoms as in PHQ-9/GAD-7. She sleeps from 8 pm through noon. She reports decrease in appetite.   She has passive SI, as her life is not better. However, she wants to get better, and denies any plan/intent.   Hallucinations- she has AH of voice of man, woman, children, once in ever few weeks.  She denies CAH.  She has VH of seeing her brother, sister. She feels safe at home, and denies paranoia.   [name], her daughter presents to the visit.  She states that Tashieka has had depression, and it has gotten worse since they lost her husband in 2020, and had stroke in Jan 2022. She is just different, and does not want to do anything but watching TV. She commented that she is tired of worrying about herself. [name] is also concerned as Chanaya does not follow any instruction. She might be discharged from PT/OT due to non compliance. She would try to talk without a walker, while there is a risk of fall (Paulin states that she can walk without it). [name] is also concerned whether she has "sleep walking." She talks to somebody, while nobody is there at night. She has history of "lying." She keeps things to herself, and sometimes say  symptoms which she does not have (Paulin agrees that she does it. She answers "I don't know, just let go of everything, I just can't," then talks about how she is not interested in doing things when she was asked about the reason). [name] takes care of her medication, and she believes [place] takes 95% of the time.   Medication- sertraline 100 mg daily (uptitrated a few months ago. [name] states that she has been less anxious since then), Bupropion 300 mg daily  Support: Household: daughter Marital status:widow (34 years of marriage. 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 It aws "alright", good support  Substance use  Tobacco Alcohol Other substances/  Current  denies denies  Past   denies  Past Treatment         Wt Readings from Last 3 Encounters:  06/25/23 150 lb 9.6 oz (68.3 kg)  06/08/23 149 lb 6.4 oz (67.8 kg)  04/05/23 152 lb 4.8 oz (69.1 kg)       Associated Signs/Symptoms: Depression Symptoms:  depressed mood, anhedonia, insomnia, fatigue, disturbed sleep, (Hypo) Manic Symptoms:   denies decreased need for sleep, euphoria Anxiety Symptoms:   mild anxiety Psychotic Symptoms:  Hallucinations: Auditory Visual PTSD Symptoms: Negative  Past Psychiatric History:  Outpatient:  Psychiatry admission: denies Previous suicide attempt: denies Past trials of medication:  History of violence:  History of head injury:   Previous Psychotropic Medications: Yes   Substance Abuse History in the last 12 months:  No.  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 Chemotherapy/post wide excision whole breast radiation.    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    Personal history of chemotherapy 07/07/12   LEFT BREAST   Personal history of malignant neoplasm of breast    Personal history of radiation therapy 2016-07-07    Rt, 2012-07-07 had on left   Stroke (HCC) 09/06/2020   1 cm acute infarction in the right parietal deep and subcortical white matter   Vertigo     Past Surgical History:  Procedure Laterality Date   BREAST BIOPSY Left 07/18/2011   +   BREAST BIOPSY Left 09/21/2015   neg   BREAST BIOPSY Left 04/05/2016   neg   BREAST BIOPSY Left 03/20/2019   [place] bx 2 areas /fat necrosis at 9:30 ribbon and 5:30 coil  BREAST EXCISIONAL BIOPSY Right 08/2015   [facility]   BREAST LUMPECTOMY Right 09/30/2015   [facility] WITH MICROPAPILLARY FEATURES AND DCIS/CLEAR MARGINS, NEGATIVE LN   BREAST LUMPECTOMY Left 08/17/2011   BREAST LUMPECTOMY WITH SENTINEL LYMPH NODE BIOPSY Right 09/30/2015   Procedure: BREAST LUMPECTOMY WITH SENTINEL LYMPH NODE BX;  Surgeon: Earline Mayotte, MD;  Location: [facility] ORS;  Service: General;  Laterality: Right;   BREAST SURGERY Left 2012   wide excision   BREAST SURGERY Left November 2013   Core biopsy of the upper-outer quadrant showed fat necrosis.   CHOLECYSTECTOMY  2007   COLONOSCOPY  2011   Dr Bluford Kaufmann   COLONOSCOPY WITH PROPOFOL N/A 02/25/2015   Procedure: COLONOSCOPY WITH PROPOFOL;  Surgeon: Wallace Cullens, MD;  Location: [facility] ENDOSCOPY;  Service: Gastroenterology;  Laterality: N/A;   CYSTOSCOPY WITH STENT PLACEMENT Left 11/05/2020   Procedure: CYSTOSCOPY WITH STENT PLACEMENT;  Surgeon: Riki Altes, MD;  Location: [facility] ORS;  Service: Urology;  Laterality: Left;   CYSTOSCOPY/URETEROSCOPY/HOLMIUM LASER/STENT PLACEMENT Left 12/07/2020   Procedure: CYSTOSCOPY/URETEROSCOPY/HOLMIUM LASER/STENT PLACEMENT;  Surgeon: Riki Altes, MD;  Location: [facility] ORS;  Service: Urology;  Laterality: Left;   DILATION AND CURETTAGE OF UTERUS  2004   HERNIA REPAIR Right 09/10/2007    Umbilical/ventral hernia repaired with 6.4 cm Proceed ventral patch   PORT-A-CATH REMOVAL     PORTACATH PLACEMENT  2013   RECURRENT HERNIA N/A 01/07/2008   Recurrent ventral hernia at the umbilicus, laparoscopy, open placement of a large Ultra Pro mesh with trans-fascial sutures.   UPPER GI ENDOSCOPY  2011    Family Psychiatric History: as below  Family History:  Family History  Problem Relation Age of Onset   Lymphoma Father    Ovarian cancer Sister    Colon cancer Brother    Lymphoma Brother    Cancer Other        stomach   Cancer Other        stomach   Breast cancer Neg Hx     Social History:   Social History   Socioeconomic History   Marital status: Widowed    Spouse name: Not on file   Number of children: 2   Years of education: H/S   Highest education level: 12th grade  Occupational History   Occupation: Retired  Tobacco Use   Smoking status: Never   Smokeless tobacco: Never  Vaping Use   Vaping status: Never Used  Substance and Sexual Activity   Alcohol use: No   Drug use: No   Sexual activity: Not Currently  Other Topics Concern   Not on file  Social History Narrative   Lives alone   Social Determinants of Health   Financial Resource Strain: Low Risk  (10/25/2021)   Overall Financial Resource Strain (CARDIA)    Difficulty of Paying Living Expenses: Not hard at all  Food Insecurity: No Food Insecurity (04/12/2023)   Hunger Vital Sign    Worried About Running Out of Food in the Last Year: Never true    Ran Out of Food in the Last Year: Never true  Transportation Needs: No Transportation Needs (04/12/2023)   PRAPARE - Administrator, Civil Service (Medical): No    Lack of Transportation (Non-Medical): No  Physical Activity: Inactive (10/30/2022)   Exercise Vital Sign    Days of Exercise per Week: 0 days    Minutes of Exercise per Session: 0 min  Stress: No Stress Concern Present (10/30/2022)   [employer] of  Occupational Health -  Occupational Stress Questionnaire    Feeling of Stress : Not at all  Social Connections: Moderately Isolated (10/30/2022)   Social Connection and Isolation Panel [NHANES]    Frequency of Communication with Friends and Family: More than three times a week    Frequency of Social Gatherings with Friends and Family: More than three times a week    Attends Religious Services: 1 to 4 times per year    Active Member of [employer] or Organizations: No    Attends Banker Meetings: Never    Marital Status: Widowed    Additional Social History: as above  Allergies:  No Known Allergies  Metabolic Disorder Labs: Lab Results  Component Value Date   HGBA1C 7.0 (H) 08/23/2022   MPG 171.42 09/07/2020   MPG 157.07 11/07/2018   No results found for: "PROLACTIN" Lab Results  Component Value Date   CHOL 110 10/14/2021   TRIG 151 (H) 10/14/2021   HDL 33 (L) 10/14/2021   CHOLHDL 3.3 10/14/2021   VLDL 18 09/07/2020   LDLCALC 51 10/14/2021   LDLCALC 57 09/07/2020   Lab Results  Component Value Date   TSH 2.110 10/14/2021    Therapeutic Level Labs: No results found for: "LITHIUM" No results found for: "CBMZ" No results found for: "VALPROATE"  Current Medications: Current Outpatient Medications  Medication Sig Dispense Refill   ACCU-CHEK AVIVA PLUS test strip CHECK  FASTING  BLOOD  GLUCOSE EVERY DAY 100 strip 0   Accu-Chek Softclix Lancets lancets TEST BLOOD SUGAR EVERY DAY 100 each 0   acetaminophen (TYLENOL) 500 MG tablet Take 500 mg by mouth every 6 (six) hours as needed for mild pain or headache.      atorvastatin (LIPITOR) 40 MG tablet TAKE 1 TABLET EVERY EVENING 90 tablet 3   buPROPion (WELLBUTRIN XL) 300 MG 24 hr tablet Take 1 tablet (300 mg total) by mouth daily. 90 tablet 1   Calcium Carb-Cholecalciferol 600-800 MG-UNIT TABS Take 1 tablet by mouth daily.     cholecalciferol (VITAMIN D) 1000 units tablet Take 1,000 Units by mouth daily.     clopidogrel (PLAVIX) 75 MG tablet  Take 1 tablet (75 mg total) by mouth daily. 90 tablet 2   cyanocobalamin 500 MCG tablet Take 500 mcg by mouth daily.     donepezil (ARICEPT) 5 MG tablet Take 5 mg by mouth at bedtime.     fluticasone (FLONASE) 50 MCG/ACT nasal spray Place 2 sprays into both nostrils daily. 16 g 6   gabapentin (NEURONTIN) 100 MG capsule Take 100 mg by mouth 2 (two) times daily.     latanoprost (XALATAN) 0.005 % ophthalmic solution Place 1 drop into both eyes at bedtime.      losartan (COZAAR) 50 MG tablet TAKE 1 TABLET EVERY DAY 90 tablet 3   metFORMIN (GLUCOPHAGE) 1000 MG tablet TAKE 1 TABLET TWICE DAILY WITH MEALS 180 tablet 3   metoprolol tartrate (LOPRESSOR) 25 MG tablet TAKE 1/2 TABLET TWICE DAILY 90 tablet 0   Multiple Vitamin (MULTIVITAMIN WITH MINERALS) TABS tablet Take 1 tablet by mouth daily.     ondansetron (ZOFRAN) 4 MG tablet Take 1 tablet (4 mg total) by mouth every 8 (eight) hours as needed for nausea or vomiting. 25 tablet 0   ondansetron (ZOFRAN-ODT) 8 MG disintegrating tablet Take by mouth as needed.     pantoprazole (PROTONIX) 40 MG tablet TAKE 1 TABLET TWICE DAILY 180 tablet 3   pyridoxine (B-6) 100 MG tablet Take 100  mg by mouth daily.     sertraline (ZOLOFT) 100 MG tablet TAKE 1 TABLET EVERY DAY 90 tablet 1   vitamin C (ASCORBIC ACID) 500 MG tablet Take 500 mg by mouth daily.     vitamin E 400 UNIT capsule Take 400 Units by mouth daily.     No current facility-administered medications for this visit.    Musculoskeletal: Strength & Muscle Tone: within normal limits Gait & Station: normal Patient leans: N/A  Psychiatric Specialty Exam: Review of Systems  Psychiatric/Behavioral:  Positive for decreased concentration, dysphoric mood, hallucinations, sleep disturbance and suicidal ideas. Negative for agitation, behavioral problems, confusion and self-injury. The patient is nervous/anxious. The patient is not hyperactive.   All other systems reviewed and are negative.   Blood pressure  132/84, pulse 80, temperature (!) 97.4 F (36.3 C), temperature source Skin, height 5\' 6"  (1.676 m), weight 150 lb 9.6 oz (68.3 kg).Body mass index is 24.31 kg/m.  General Appearance: Well Groomed  Eye Contact:  Good  Speech:  Clear and Coherent  Volume:  Normal  Mood:  Depressed  Affect:  Blunt  Thought Process:  Coherent, disorganized in timeline at times  Orientation:  Full (Time, Place, and Person)  Thought Content:  Logical  Suicidal Thoughts:  Yes.  without intent/plan  Homicidal Thoughts:  No  Memory:  Immediate;   Good  Judgement:  Good  Insight:  Fair  Psychomotor Activity:  Normal  Concentration:  Concentration: Good and Attention Span: Good  Recall:  Poor  Fund of Knowledge:Good  Language: Good  Akathisia:  No  Handed:  Right  AIMS (if indicated):  not done  Assets:  Communication Skills Desire for Improvement  ADL's:  Intact  Cognition: WNL  Sleep:   hypersomnia   Screenings: GAD-7    Flowsheet Row Office Visit from 06/08/2023 in [facility]  Total GAD-7 Score 7      PHQ2-9    Flowsheet Row Office Visit from 06/08/2023 in [facility] Office Visit from 02/02/2023 in [facility] Office Visit from 12/08/2022 in [facility] Clinical Support from 10/30/2022 in [facility] Office Visit from 10/13/2022 in [facility]  PHQ-2 Total Score 6 2 6 3 6   PHQ-9 Total Score 17 7 13 8 27       Flowsheet Row ED from 12/15/2022 in [facility] at [facility] ED from 11/28/2022 in [facility] Emergency Department at [facility] ED from 09/23/2022 in [facility] Emergency Department at [facility]  C-SSRS RISK CATEGORY No Risk No Risk No Risk       Assessment and Plan:  GEM SAYSON is a 82 y.o. 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   1. MDD (major depressive disorder), recurrent episode, mild (HCC) Acute stressors include:  Other stressors include:  loss of her husband in 2020,  History: reportedly has a history of not telling the truth to provider, either to minimize, or  falsely complaining symptoms She reports marked anhedonia, and other depressive symptoms, which has worsened since loss of her husband, s/p stroke.  Differential of anhedonia includes depression, apathy secondary to neurocognitive disorder, s/p stroke, s/p chemotherapy.  Will  uptitrate sertraline to optimize treatment for depression given she had benefit from uptitration a few months ago. Discussed potential risk of drowsiness.  Will continue bupropion to target depression.   2. Mild neurocognitive disorder (HCC) Functional Status   IADL: Independent in the following:            Requires assistance with the following: managing finances, medications, driving ADL  Independent in the following: bathing and hygiene, feeding, continence, toileting, walking (walker)          Requires assistance with the following: grooming Folate, Vitamin B12, TSH Images Neuropsych assessment:  Etiology: vascular  Exam is notable for occasional disorganization, although she is alert and oriented during the exam. She is on donepezil, prescribed by Dr. Sherryll Burger for neurocognitive disorder.  She has neuropsychiatric symptoms of hallucinations, and episode which is consistent with sundowning.  There is no concern of aggression at this time. Will not start antipsychotics at this time as risk outweigh benefit.  Will do further evaluation regarding cognition at the next visit.  We will obtain labs to rule out medical health issues contributing to her symptoms.   #  Hypersomnia She has history of hypersomnia for many years.  She may benefit from stimulant in the future to address this and potential apathy. Will continue to assess.  Plan Increase sertraline 150 mg at night Continue bupropion 300 mg daily  Check labs (TSH, folate) at [facility] Obtain ROI to speak with her daughter, [name]  Next appointment: 1/14 at 3:30  The patient demonstrates the following risk factors for suicide: Chronic risk factors for suicide include: psychiatric disorder of depression . Acute risk factors for suicide include: unemployment and loss (financial, interpersonal, professional). Protective factors for this patient include: positive social support and hope for the future. Considering these factors, the overall suicide risk at this point appears to be low. Patient is appropriate for outpatient follow up.   Collaboration of Care: Other reviewed notes in Epic  Patient/Guardian was advised Release of Information must be obtained prior to any record release in order to collaborate their care with an outside provider. Patient/Guardian was advised if they have not already done so to contact the registration department to sign all necessary forms in order for [place] to release information regarding their care.   Consent: Patient/Guardian gives verbal consent for treatment and assignment of benefits for services provided during this visit. Patient/Guardian expressed understanding and agreed to proceed.   The duration of the time spent on the following activities on the date of the encounter was 60 minutes.   Preparing to see the patient (e.g., review of test, records)  Obtaining and/or reviewing separately obtained history  Performing a medically necessary exam and/or evaluation  Counseling and educating the patient/family/caregiver  Ordering medications, tests, or procedures  Referring and communicating with other healthcare professionals (when not reported separately)  Documenting  clinical information in the electronic or paper health record  Independently interpreting results of tests/labs and communication of results to the family or caregiver  Care coordination (when not reported separately)   Neysa Hotter, MD 11/18/20243:06 PM

## 2023-06-25 ENCOUNTER — Encounter: Payer: Self-pay | Admitting: Psychiatry

## 2023-06-25 ENCOUNTER — Other Ambulatory Visit
Admission: RE | Admit: 2023-06-25 | Discharge: 2023-06-25 | Disposition: A | Discharge status: Home / Self Care | Payer: Medicare HMO | Source: Ambulatory Visit | Attending: Psychiatry | Admitting: Psychiatry

## 2023-06-25 ENCOUNTER — Ambulatory Visit (INDEPENDENT_AMBULATORY_CARE_PROVIDER_SITE_OTHER): Payer: Medicare HMO | Admitting: Psychiatry

## 2023-06-25 VITALS — BP 132/84 | HR 80 | Temp 97.4°F | Ht 66.0 in | Wt 150.6 lb

## 2023-06-25 DIAGNOSIS — F330 Major depressive disorder, recurrent, mild: Secondary | ICD-10-CM

## 2023-06-25 DIAGNOSIS — G3184 Mild cognitive impairment, so stated: Secondary | ICD-10-CM

## 2023-06-25 LAB — TSH: TSH: 2.583 u[IU]/mL (ref 0.350–4.500)

## 2023-06-25 LAB — FOLATE: Folate: 24 ng/mL (ref >5.9)

## 2023-06-25 NOTE — Patient Instructions (Signed)
Increase sertraline 150 mg at night Continue bupropion 300 mg daily  Check labs (TSH, folate) at [facility] Next appointment: 1/14 at 3:30

## 2023-06-26 DIAGNOSIS — I998 Other disorder of circulatory system: Secondary | ICD-10-CM | POA: Diagnosis not present

## 2023-06-26 DIAGNOSIS — M545 Low back pain, unspecified: Secondary | ICD-10-CM | POA: Diagnosis not present

## 2023-06-26 DIAGNOSIS — F03A4 Unspecified dementia, mild, with anxiety: Secondary | ICD-10-CM | POA: Diagnosis not present

## 2023-06-26 DIAGNOSIS — E119 Type 2 diabetes mellitus without complications: Secondary | ICD-10-CM | POA: Diagnosis not present

## 2023-06-26 DIAGNOSIS — R4185 Anosognosia: Secondary | ICD-10-CM | POA: Diagnosis not present

## 2023-06-26 DIAGNOSIS — F03A3 Unspecified dementia, mild, with mood disturbance: Secondary | ICD-10-CM | POA: Diagnosis not present

## 2023-06-26 DIAGNOSIS — G8929 Other chronic pain: Secondary | ICD-10-CM | POA: Diagnosis not present

## 2023-06-26 DIAGNOSIS — I10 Essential (primary) hypertension: Secondary | ICD-10-CM | POA: Diagnosis not present

## 2023-06-26 DIAGNOSIS — G5793 Unspecified mononeuropathy of bilateral lower limbs: Secondary | ICD-10-CM | POA: Diagnosis not present

## 2023-06-27 DIAGNOSIS — E119 Type 2 diabetes mellitus without complications: Secondary | ICD-10-CM | POA: Diagnosis not present

## 2023-06-27 DIAGNOSIS — G8929 Other chronic pain: Secondary | ICD-10-CM | POA: Diagnosis not present

## 2023-06-27 DIAGNOSIS — F03A4 Unspecified dementia, mild, with anxiety: Secondary | ICD-10-CM | POA: Diagnosis not present

## 2023-06-27 DIAGNOSIS — F03A3 Unspecified dementia, mild, with mood disturbance: Secondary | ICD-10-CM | POA: Diagnosis not present

## 2023-06-27 DIAGNOSIS — G5793 Unspecified mononeuropathy of bilateral lower limbs: Secondary | ICD-10-CM | POA: Diagnosis not present

## 2023-06-27 DIAGNOSIS — R4185 Anosognosia: Secondary | ICD-10-CM | POA: Diagnosis not present

## 2023-06-27 DIAGNOSIS — I998 Other disorder of circulatory system: Secondary | ICD-10-CM | POA: Diagnosis not present

## 2023-06-27 DIAGNOSIS — M545 Low back pain, unspecified: Secondary | ICD-10-CM | POA: Diagnosis not present

## 2023-06-27 DIAGNOSIS — I10 Essential (primary) hypertension: Secondary | ICD-10-CM | POA: Diagnosis not present

## 2023-07-03 DIAGNOSIS — R4185 Anosognosia: Secondary | ICD-10-CM | POA: Diagnosis not present

## 2023-07-03 DIAGNOSIS — M545 Low back pain, unspecified: Secondary | ICD-10-CM | POA: Diagnosis not present

## 2023-07-03 DIAGNOSIS — F03A3 Unspecified dementia, mild, with mood disturbance: Secondary | ICD-10-CM | POA: Diagnosis not present

## 2023-07-03 DIAGNOSIS — G5793 Unspecified mononeuropathy of bilateral lower limbs: Secondary | ICD-10-CM | POA: Diagnosis not present

## 2023-07-03 DIAGNOSIS — I998 Other disorder of circulatory system: Secondary | ICD-10-CM | POA: Diagnosis not present

## 2023-07-03 DIAGNOSIS — E119 Type 2 diabetes mellitus without complications: Secondary | ICD-10-CM | POA: Diagnosis not present

## 2023-07-03 DIAGNOSIS — F03A4 Unspecified dementia, mild, with anxiety: Secondary | ICD-10-CM | POA: Diagnosis not present

## 2023-07-03 DIAGNOSIS — G8929 Other chronic pain: Secondary | ICD-10-CM | POA: Diagnosis not present

## 2023-07-03 DIAGNOSIS — I10 Essential (primary) hypertension: Secondary | ICD-10-CM | POA: Diagnosis not present

## 2023-07-10 DIAGNOSIS — E119 Type 2 diabetes mellitus without complications: Secondary | ICD-10-CM | POA: Diagnosis not present

## 2023-07-10 DIAGNOSIS — F03A3 Unspecified dementia, mild, with mood disturbance: Secondary | ICD-10-CM | POA: Diagnosis not present

## 2023-07-10 DIAGNOSIS — G5793 Unspecified mononeuropathy of bilateral lower limbs: Secondary | ICD-10-CM | POA: Diagnosis not present

## 2023-07-10 DIAGNOSIS — M545 Low back pain, unspecified: Secondary | ICD-10-CM | POA: Diagnosis not present

## 2023-07-10 DIAGNOSIS — I998 Other disorder of circulatory system: Secondary | ICD-10-CM | POA: Diagnosis not present

## 2023-07-10 DIAGNOSIS — G8929 Other chronic pain: Secondary | ICD-10-CM | POA: Diagnosis not present

## 2023-07-10 DIAGNOSIS — F03A4 Unspecified dementia, mild, with anxiety: Secondary | ICD-10-CM | POA: Diagnosis not present

## 2023-07-10 DIAGNOSIS — R4185 Anosognosia: Secondary | ICD-10-CM | POA: Diagnosis not present

## 2023-07-10 DIAGNOSIS — I10 Essential (primary) hypertension: Secondary | ICD-10-CM | POA: Diagnosis not present

## 2023-07-12 DIAGNOSIS — I998 Other disorder of circulatory system: Secondary | ICD-10-CM | POA: Diagnosis not present

## 2023-07-12 DIAGNOSIS — R4185 Anosognosia: Secondary | ICD-10-CM | POA: Diagnosis not present

## 2023-07-12 DIAGNOSIS — G5793 Unspecified mononeuropathy of bilateral lower limbs: Secondary | ICD-10-CM | POA: Diagnosis not present

## 2023-07-12 DIAGNOSIS — F03A4 Unspecified dementia, mild, with anxiety: Secondary | ICD-10-CM | POA: Diagnosis not present

## 2023-07-12 DIAGNOSIS — E119 Type 2 diabetes mellitus without complications: Secondary | ICD-10-CM | POA: Diagnosis not present

## 2023-07-12 DIAGNOSIS — I10 Essential (primary) hypertension: Secondary | ICD-10-CM | POA: Diagnosis not present

## 2023-07-12 DIAGNOSIS — M545 Low back pain, unspecified: Secondary | ICD-10-CM | POA: Diagnosis not present

## 2023-07-12 DIAGNOSIS — G8929 Other chronic pain: Secondary | ICD-10-CM | POA: Diagnosis not present

## 2023-07-12 DIAGNOSIS — F03A3 Unspecified dementia, mild, with mood disturbance: Secondary | ICD-10-CM | POA: Diagnosis not present

## 2023-07-16 ENCOUNTER — Other Ambulatory Visit: Payer: Self-pay | Admitting: *Deleted

## 2023-07-16 DIAGNOSIS — N201 Calculus of ureter: Secondary | ICD-10-CM

## 2023-07-18 ENCOUNTER — Ambulatory Visit
Admission: RE | Admit: 2023-07-18 | Discharge: 2023-07-18 | Disposition: A | Discharge status: Home / Self Care | Payer: Medicare HMO | Attending: Urology | Admitting: Urology

## 2023-07-18 ENCOUNTER — Ambulatory Visit
Admission: RE | Admit: 2023-07-18 | Discharge: 2023-07-18 | Disposition: A | Discharge status: Home / Self Care | Payer: Medicare HMO | Source: Ambulatory Visit | Attending: Urology | Admitting: Urology

## 2023-07-18 ENCOUNTER — Ambulatory Visit: Payer: Medicare HMO | Admitting: Urology

## 2023-07-18 ENCOUNTER — Encounter: Payer: Self-pay | Admitting: Urology

## 2023-07-18 VITALS — BP 124/83 | HR 80 | Ht 66.0 in | Wt 153.0 lb

## 2023-07-18 DIAGNOSIS — R4185 Anosognosia: Secondary | ICD-10-CM | POA: Diagnosis not present

## 2023-07-18 DIAGNOSIS — N201 Calculus of ureter: Secondary | ICD-10-CM | POA: Insufficient documentation

## 2023-07-18 DIAGNOSIS — F03A3 Unspecified dementia, mild, with mood disturbance: Secondary | ICD-10-CM | POA: Diagnosis not present

## 2023-07-18 DIAGNOSIS — Z09 Encounter for follow-up examination after completed treatment for conditions other than malignant neoplasm: Secondary | ICD-10-CM

## 2023-07-18 DIAGNOSIS — N200 Calculus of kidney: Secondary | ICD-10-CM | POA: Diagnosis not present

## 2023-07-18 DIAGNOSIS — Z87442 Personal history of urinary calculi: Secondary | ICD-10-CM

## 2023-07-18 DIAGNOSIS — G8929 Other chronic pain: Secondary | ICD-10-CM | POA: Diagnosis not present

## 2023-07-18 DIAGNOSIS — I10 Essential (primary) hypertension: Secondary | ICD-10-CM | POA: Diagnosis not present

## 2023-07-18 DIAGNOSIS — M545 Low back pain, unspecified: Secondary | ICD-10-CM | POA: Diagnosis not present

## 2023-07-18 DIAGNOSIS — G5793 Unspecified mononeuropathy of bilateral lower limbs: Secondary | ICD-10-CM | POA: Diagnosis not present

## 2023-07-18 DIAGNOSIS — I998 Other disorder of circulatory system: Secondary | ICD-10-CM | POA: Diagnosis not present

## 2023-07-18 DIAGNOSIS — E119 Type 2 diabetes mellitus without complications: Secondary | ICD-10-CM | POA: Diagnosis not present

## 2023-07-18 DIAGNOSIS — F03A4 Unspecified dementia, mild, with anxiety: Secondary | ICD-10-CM | POA: Diagnosis not present

## 2023-07-18 NOTE — Progress Notes (Signed)
I, Maysun Anabel Bene, acting as a scribe for Riki Altes, MD., have documented all relevant documentation on the behalf of Riki Altes, MD, as directed by Riki Altes, MD while in the presence of Riki Altes, MD.  07/18/2023 3:01 PM   Leah Schaefer 11/17/1940 [id number]  Referring provider: [name], MD [address] [place],  [place] [address]  Chief Complaint  Patient presents with   Nephrolithiasis   Urologic history 1.  History urinary calculi Urgent stent placement 11/06/2020 for 10 mm UPJ calculus with infection CT with additional lower pole calculi Ureteroscopy/laser lithotripsy of left renal calculi 12/07/2020 Stone analysis 100% carbonate apatite Metabolic evaluation with low urine volume 1.4 L and hypocitraturia States she has improved her water intake.  HPI: Leah Schaefer is a 82 y.o. female presents for annual follow-up. She presents today with her daughter.  Since last year's visit, denies flank pain, recurrent stone symptoms.  She did have a CT of the abdomen and pelvis performed May 2024 for abdominal pain/nausea, vomiting. No renal or ureteral calculi were seen.   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 Chemotherapy/post wide excision whole breast radiation.   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    Personal history of chemotherapy 2013   LEFT BREAST   Personal history of malignant neoplasm of breast    Personal history of radiation therapy 2017    Rt, 2013 had on left   Stroke (HCC) 09/06/2020   1 cm acute infarction in the right parietal deep and subcortical white matter   Vertigo     Surgical History: Past Surgical History:  Procedure Laterality Date   BREAST BIOPSY Left 07/18/2011   +   BREAST BIOPSY Left 09/21/2015   neg   BREAST BIOPSY Left 04/05/2016   neg   BREAST BIOPSY Left 03/20/2019   [place] bx 2 areas /fat necrosis at 9:30 ribbon and 5:30 coil   BREAST EXCISIONAL BIOPSY Right 08/2015   [facility]   BREAST LUMPECTOMY Right 09/30/2015   [facility] WITH MICROPAPILLARY FEATURES AND DCIS/CLEAR MARGINS, NEGATIVE LN   BREAST LUMPECTOMY Left 08/17/2011   BREAST LUMPECTOMY WITH SENTINEL LYMPH NODE BIOPSY Right 09/30/2015   Procedure: BREAST LUMPECTOMY WITH SENTINEL LYMPH NODE BX;  Surgeon: Earline Mayotte, MD;  Location: [facility] ORS;  Service: General;  Laterality: Right;   BREAST SURGERY Left 2012   wide excision   BREAST SURGERY Left November 2013   Core biopsy of the upper-outer quadrant showed fat  necrosis.   CHOLECYSTECTOMY  2007   COLONOSCOPY  2011   Dr Bluford Kaufmann   COLONOSCOPY WITH PROPOFOL N/A 02/25/2015   Procedure: COLONOSCOPY WITH PROPOFOL;  Surgeon: Wallace Cullens, MD;  Location: [facility] ENDOSCOPY;  Service: Gastroenterology;  Laterality: N/A;   CYSTOSCOPY WITH STENT PLACEMENT Left 11/05/2020   Procedure: CYSTOSCOPY WITH STENT PLACEMENT;  Surgeon: Riki Altes, MD;  Location: [facility] ORS;  Service: Urology;  Laterality: Left;   CYSTOSCOPY/URETEROSCOPY/HOLMIUM LASER/STENT PLACEMENT Left 12/07/2020   Procedure: CYSTOSCOPY/URETEROSCOPY/HOLMIUM LASER/STENT PLACEMENT;   Surgeon: Riki Altes, MD;  Location: [facility] ORS;  Service: Urology;  Laterality: Left;   DILATION AND CURETTAGE OF UTERUS  2004   HERNIA REPAIR Right 09/10/2007   Umbilical/ventral hernia repaired with 6.4 cm Proceed ventral patch   PORT-A-CATH REMOVAL     PORTACATH PLACEMENT  2013   RECURRENT HERNIA N/A 01/07/2008   Recurrent ventral hernia at the umbilicus, laparoscopy, open placement of a large Ultra Pro mesh with trans-fascial sutures.   UPPER GI ENDOSCOPY  2011    Home Medications:  Allergies as of 07/18/2023   No Known Allergies      Medication List        Accurate as of July 18, 2023  3:01 PM. If you have any questions, ask your nurse or doctor.          Accu-Chek Aviva Plus test strip Generic drug: glucose blood CHECK  FASTING  BLOOD  GLUCOSE EVERY DAY   Accu-Chek Softclix Lancets lancets TEST BLOOD SUGAR EVERY DAY   acetaminophen 500 MG tablet Commonly known as: TYLENOL Take 500 mg by mouth every 6 (six) hours as needed for mild pain or headache.   ascorbic acid 500 MG tablet Commonly known as: VITAMIN C Take 500 mg by mouth daily.   atorvastatin 40 MG tablet Commonly known as: LIPITOR TAKE 1 TABLET EVERY EVENING   buPROPion 300 MG 24 hr tablet Commonly known as: WELLBUTRIN XL Take 1 tablet (300 mg total) by mouth daily.   Calcium Carb-Cholecalciferol 600-800 MG-UNIT Tabs Take 1 tablet by mouth daily.   cholecalciferol 1000 units tablet Commonly known as: VITAMIN D Take 1,000 Units by mouth daily.   clopidogrel 75 MG tablet Commonly known as: PLAVIX Take 1 tablet (75 mg total) by mouth daily.   cyanocobalamin 500 MCG tablet Commonly known as: VITAMIN B12 Take 500 mcg by mouth daily.   donepezil 5 MG tablet Commonly known as: ARICEPT Take 5 mg by mouth at bedtime.   fluticasone 50 MCG/ACT nasal spray Commonly known as: FLONASE Place 2 sprays into both nostrils daily.   gabapentin 100 MG capsule Commonly known as: NEURONTIN Take  100 mg by mouth 2 (two) times daily.   latanoprost 0.005 % ophthalmic solution Commonly known as: XALATAN Place 1 drop into both eyes at bedtime.   losartan 50 MG tablet Commonly known as: COZAAR TAKE 1 TABLET EVERY DAY   metFORMIN 1000 MG tablet Commonly known as: GLUCOPHAGE TAKE 1 TABLET TWICE DAILY WITH MEALS   metoprolol tartrate 25 MG tablet Commonly known as: LOPRESSOR TAKE 1/2 TABLET TWICE DAILY   multivitamin with minerals Tabs tablet Take 1 tablet by mouth daily.   ondansetron 4 MG tablet Commonly known as: Zofran Take 1 tablet (4 mg total) by mouth every 8 (eight) hours as needed for nausea or vomiting.   ondansetron 8 MG disintegrating tablet Commonly known as: ZOFRAN-ODT Take by mouth as needed.   pantoprazole 40 MG tablet Commonly known as: PROTONIX TAKE 1  TABLET TWICE DAILY   pyridoxine 100 MG tablet Commonly known as: B-6 Take 100 mg by mouth daily.   sertraline 100 MG tablet Commonly known as: ZOLOFT TAKE 1 TABLET EVERY DAY   vitamin E 180 MG (400 UNITS) capsule Take 400 Units by mouth daily.        Allergies: No Known Allergies  Family History: Family History  Problem Relation Age of Onset   Lymphoma Father    Ovarian cancer Sister    Colon cancer Brother    Lymphoma Brother    Cancer Other        stomach   Cancer Other        stomach   Breast cancer Neg Hx     Social History:  reports that she has never smoked. She has never used smokeless tobacco. She reports that she does not drink alcohol and does not use drugs.   Physical Exam: BP 124/83   Pulse 80   Ht 5\' 6"  (1.676 m)   Wt 153 lb (69.4 kg)   BMI 24.69 kg/m   Constitutional:  Alert and oriented, No acute distress. HEENT: [place] AT, moist mucus membranes.  Trachea midline, no masses. Cardiovascular: No clubbing, cyanosis, or edema. Respiratory: Normal respiratory effort, no increased work of breathing. GI: Abdomen is soft, nontender, nondistended, no abdominal  masses Skin: No rashes, bruises or suspicious lesions. Neurologic: Grossly intact, no focal deficits, moving all 4 extremities. Psychiatric: Normal mood and affect.   Pertinent Imaging: KUB performed earlier this morning was personally reviewed and interpreted. There are faint calcifications superior to cholecystectomy clips, most likely in the bowel.  Abdomen 1 view (KUB)  Narrative CLINICAL DATA:  Follow-up left ureteral stone.  EXAM: ABDOMEN - 1 VIEW  COMPARISON:  07/15/2021  FINDINGS: Bowel gas pattern is nonobstructive. No free peritoneal air. Surgical clips over the right upper quadrant. No definite stones over the kidneys or ureters. No stones project over the bladder. There is moderate degenerative change of the spine and mild degenerative change of the hips.  IMPRESSION: 1.  Nonobstructive bowel gas pattern.  2.  No stone seen over the urinary system.   Electronically Signed By: Elberta Fortis M.D. On: 07/17/2022 13:02   Assessment & Plan:    1. Personal history urinary calculi KUB today with no obvious calculi. Recent CT showed no renal calculi.  Continue annual follow-up with KUB.  [facility] [address] [place], [place] [address] [phone]

## 2023-07-20 ENCOUNTER — Encounter: Payer: Self-pay | Admitting: Family Medicine

## 2023-07-20 ENCOUNTER — Other Ambulatory Visit: Payer: Self-pay | Admitting: Family Medicine

## 2023-07-20 ENCOUNTER — Ambulatory Visit (INDEPENDENT_AMBULATORY_CARE_PROVIDER_SITE_OTHER): Payer: Medicare HMO | Admitting: Family Medicine

## 2023-07-20 VITALS — BP 145/77 | HR 70 | Resp 16 | Wt 148.6 lb

## 2023-07-20 DIAGNOSIS — E118 Type 2 diabetes mellitus with unspecified complications: Secondary | ICD-10-CM | POA: Diagnosis not present

## 2023-07-20 DIAGNOSIS — I6930 Unspecified sequelae of cerebral infarction: Secondary | ICD-10-CM | POA: Diagnosis not present

## 2023-07-20 DIAGNOSIS — R4589 Other symptoms and signs involving emotional state: Secondary | ICD-10-CM | POA: Diagnosis not present

## 2023-07-20 DIAGNOSIS — I10 Essential (primary) hypertension: Secondary | ICD-10-CM | POA: Diagnosis not present

## 2023-07-20 DIAGNOSIS — R42 Dizziness and giddiness: Secondary | ICD-10-CM | POA: Diagnosis not present

## 2023-07-20 DIAGNOSIS — K219 Gastro-esophageal reflux disease without esophagitis: Secondary | ICD-10-CM

## 2023-07-20 DIAGNOSIS — R296 Repeated falls: Secondary | ICD-10-CM

## 2023-07-20 DIAGNOSIS — Z23 Encounter for immunization: Secondary | ICD-10-CM

## 2023-07-20 DIAGNOSIS — R55 Syncope and collapse: Secondary | ICD-10-CM

## 2023-07-20 DIAGNOSIS — F339 Major depressive disorder, recurrent, unspecified: Secondary | ICD-10-CM

## 2023-07-20 MED ORDER — SERTRALINE HCL 100 MG PO TABS
150.0000 mg | ORAL_TABLET | Freq: Every day | ORAL | 1 refills | Status: DC
Start: 2023-07-20 — End: 2023-08-13

## 2023-07-20 MED ORDER — CLOPIDOGREL BISULFATE 75 MG PO TABS
75.0000 mg | ORAL_TABLET | Freq: Every day | ORAL | 2 refills | Status: DC
Start: 2023-07-20 — End: 2023-08-13

## 2023-07-20 MED ORDER — METOPROLOL TARTRATE 25 MG PO TABS
12.5000 mg | ORAL_TABLET | Freq: Two times a day (BID) | ORAL | 2 refills | Status: DC
Start: 2023-07-20 — End: 2023-08-13

## 2023-07-20 NOTE — Telephone Encounter (Signed)
Requested Prescriptions  Pending Prescriptions Disp Refills   metoprolol tartrate (LOPRESSOR) 25 MG tablet [Pharmacy Med Name: Metoprolol Tartrate Oral Tablet 25 MG] 90 tablet 3    Sig: TAKE 1/2 TABLET TWICE DAILY     Cardiovascular:  Beta Blockers Passed - 07/20/2023 11:45 AM      Passed - Last BP in normal range    BP Readings from Last 1 Encounters:  07/18/23 124/83         Passed - Last Heart Rate in normal range    Pulse Readings from Last 1 Encounters:  07/18/23 80         Passed - Valid encounter within last 6 months    Recent Outpatient Visits           1 month ago Herpes zoster without complication   [place] [facility] Simmons-Robinson, [place], MD   3 months ago Pain in right lumbar region of back   [place] [facility] Simmons-Robinson, [place], MD   5 months ago Depressed affect   [place] [facility] [place], [place], MD   7 months ago Urinary symptom or sign   [place] [facility] Simmons-Robinson, [place], MD   7 months ago Urinary symptom or sign   [facility] [place], Monico Blitz, DO       Future Appointments             Today Simmons-Robinson, Tawanna Cooler, MD [facility]   In 12 months Stoioff, Verna Czech, MD [facility] [place]             buPROPion (WELLBUTRIN XL) 300 MG 24 hr tablet [Pharmacy Med Name: buPROPion HCl ER (XL) Oral Tablet Extended Release 24 Hour 300 MG] 90 tablet 1    Sig: TAKE 1 TABLET EVERY DAY     Psychiatry: Antidepressants - bupropion Passed - 07/20/2023 11:45 AM      Passed - Cr in normal range and within 360 days    Creatinine  Date Value Ref Range Status  02/02/2023 0.92 0.44 - 1.00 mg/dL Final  16/05/9603 5.40 0.60 - 1.30 mg/dL Final         Passed - AST in normal range and within 360 days    AST  Date Value Ref Range Status  02/02/2023 18 15 - 41 U/L Final         Passed  - ALT in normal range and within 360 days    ALT  Date Value Ref Range Status  02/02/2023 16 0 - 44 U/L Final   SGPT (ALT)  Date Value Ref Range Status  09/04/2014 34 14 - 63 U/L Final         Passed - Completed PHQ-2 or PHQ-9 in the last 360 days      Passed - Last BP in normal range    BP Readings from Last 1 Encounters:  07/18/23 124/83         Passed - Valid encounter within last 6 months    Recent Outpatient Visits           1 month ago Herpes zoster without complication   [place] [facility] [place], [place], MD   3 months ago Pain in right lumbar region of back   [facility] Simmons-Robinson, [place], MD   5 months ago Depressed affect   [place] [facility] [place], [place], MD   7 months ago Urinary  symptom or sign   [place] [facility] Simmons-Robinson, [place], MD   7 months ago Urinary symptom or sign   [facility] [place], Monico Blitz, DO       Future Appointments             Today Simmons-Robinson, Tawanna Cooler, MD [facility]   In 12 months Stoioff, Verna Czech, MD [facility] [place] [facility]

## 2023-07-20 NOTE — Progress Notes (Unsigned)
Established patient visit   Patient: Leah Schaefer   DOB: 03/23/1941   82 y.o. Female  MRN: [id number] Visit Date: 07/20/2023  Today's healthcare provider: [name], MD   Chief Complaint  Patient presents with   Medical Management of Chronic Issues   Subjective       Discussed the use of AI scribe software for clinical note transcription with the patient, who gave verbal consent to proceed.  History of Present Illness   The patient is an 82 year old individual with a history of multiple strokes and depression, presenting for a follow-up visit due to recurrent falls. The patient has been experiencing these falls frequently, often resulting in minor injuries such as scrapes. The most recent fall occurred the day prior to the visit, during which the patient sustained a minor scrape. The patient denies any associated headaches or loss of consciousness with these falls.  The patient's falls are often associated with not using her walker, despite having one available. The patient admits to sometimes feeling dizzy upon standing or bending over, which may contribute to the falls. The patient is currently undergoing physical therapy and occupational therapy, but there have been concerns about compliance, particularly with the use of the walker.  The patient also has a history of diabetes, with the last hemoglobin A1c in January of the current year showing an elevated level of 7. The patient's blood pressure has generally been stable, with occasional elevations noted.  In terms of mental health, the patient is on Zoloft 150 mg daily for depression. The patient reports feeling generally alright but admits to having days where she feels unmotivated and uninterested in activities she used to enjoy, such as reading or puzzles. The patient acknowledges that she should be more active but struggles with consistency.  The patient's medications include metoprolol, losartan, Aricept,  Plavix, and metformin. The patient's caregiver reports occasional missed doses due to the patient's method of taking medications, which involves scooping them up in her hand. The patient's medications have been adjusted recently, with some moved to nighttime dosing to minimize drowsiness during the day.  The patient's foot health is good, with no signs of fungal infection or poor circulation noted. The patient's vision is monitored regularly due to her diabetes, with the last eye exam conducted earlier in the year. The patient is due for a tetanus vaccine and a shingles vaccine, but these have been postponed due to recent receipt of the flu vaccine.       Dr. 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 Chemotherapy/post wide excision whole breast radiation.   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    Personal history of chemotherapy 2013   LEFT BREAST   Personal history of malignant neoplasm of breast    Personal history of radiation therapy 2017    Rt, 2013 had on left   Stroke (HCC) 09/06/2020   1 cm acute infarction in the right parietal deep and  subcortical white matter   Vertigo     Medications: Outpatient Medications Prior to Visit  Medication Sig   ACCU-CHEK AVIVA PLUS test strip CHECK  FASTING  BLOOD  GLUCOSE EVERY DAY   Accu-Chek Softclix Lancets lancets TEST BLOOD SUGAR EVERY DAY   acetaminophen (TYLENOL) 500 MG tablet Take 500 mg by mouth every 6 (six) hours as needed for mild pain or headache.    atorvastatin (LIPITOR) 40 MG tablet TAKE 1 TABLET EVERY EVENING   buPROPion (WELLBUTRIN XL) 300 MG 24 hr tablet TAKE 1 TABLET EVERY DAY   Calcium Carb-Cholecalciferol 600-800 MG-UNIT TABS Take 1 tablet by mouth daily.   cholecalciferol (VITAMIN D) 1000 units tablet Take 1,000 Units by mouth daily.   cyanocobalamin 500 MCG tablet Take 500 mcg by mouth daily.   donepezil (ARICEPT) 5 MG tablet Take 5 mg by mouth at bedtime.   fluticasone (FLONASE) 50 MCG/ACT nasal spray Place 2 sprays into both nostrils daily.   gabapentin (NEURONTIN) 100 MG capsule Take 100 mg by mouth 2 (two) times daily.   latanoprost (XALATAN) 0.005 % ophthalmic solution Place 1 drop into both eyes at bedtime.    losartan (COZAAR) 50 MG tablet TAKE 1 TABLET EVERY DAY   metFORMIN (GLUCOPHAGE) 1000 MG tablet TAKE 1 TABLET TWICE DAILY WITH MEALS   Multiple Vitamin (MULTIVITAMIN WITH MINERALS) TABS tablet Take 1 tablet by mouth daily.   ondansetron (ZOFRAN) 4 MG tablet Take 1 tablet (4 mg total) by mouth every 8 (eight) hours as needed for nausea or vomiting.   ondansetron (ZOFRAN-ODT) 8 MG disintegrating tablet Take by mouth as needed.   pantoprazole (PROTONIX) 40 MG tablet TAKE 1 TABLET TWICE DAILY   pyridoxine (B-6) 100 MG tablet Take 100 mg by mouth daily.   vitamin C (ASCORBIC ACID) 500 MG tablet Take 500 mg by mouth daily.   vitamin E 400 UNIT capsule Take 400 Units by mouth daily.   [DISCONTINUED] clopidogrel (PLAVIX) 75 MG tablet Take 1 tablet (75 mg total) by mouth daily.   [DISCONTINUED] metoprolol tartrate (LOPRESSOR) 25 MG tablet TAKE 1/2 TABLET TWICE  DAILY   [DISCONTINUED] sertraline (ZOLOFT) 100 MG tablet TAKE 1 TABLET EVERY DAY (Patient taking differently: Take 100 mg by mouth daily.)   No facility-administered medications prior to visit.    Review of Systems  Last metabolic panel Lab Results  Component Value Date   GLUCOSE 98 07/20/2023   NA 143 07/20/2023   K 4.5 07/20/2023   CL 104 07/20/2023   CO2 23 07/20/2023   BUN 12 07/20/2023   CREATININE 0.94 07/20/2023   GFRNONAA >60 02/02/2023   CALCIUM 10.0 07/20/2023   PROT 7.0 07/20/2023   ALBUMIN 4.5 07/20/2023   LABGLOB 2.5 07/20/2023   AGRATIO 2.0 08/23/2022   BILITOT 0.3 07/20/2023   ALKPHOS 70 07/20/2023   AST 19 07/20/2023   ALT 12 07/20/2023   ANIONGAP 8 02/02/2023   Last lipids Lab Results  Component Value Date   CHOL 110 10/14/2021   HDL 33 (L)  10/14/2021   LDLCALC 51 10/14/2021   TRIG 151 (H) 10/14/2021   CHOLHDL 3.3 10/14/2021   Last hemoglobin A1c Lab Results  Component Value Date   HGBA1C 6.5 (H) 07/20/2023   Last thyroid functions Lab Results  Component Value Date   TSH 2.583 06/25/2023        Objective    BP (!) 145/77 (BP Location: Right Arm, Patient Position: Sitting, Cuff Size: Normal)   Pulse 70   Resp 16   Wt 148 lb 9.6 oz (67.4 kg)   BMI 23.98 kg/m  BP Readings from Last 3 Encounters:  07/20/23 (!) 145/77  07/18/23 124/83  06/08/23 102/68   Wt Readings from Last 3 Encounters:  07/20/23 148 lb 9.6 oz (67.4 kg)  07/18/23 153 lb (69.4 kg)  06/08/23 149 lb 6.4 oz (67.8 kg)        Physical Exam Vitals reviewed.  Constitutional:      General: She is not in acute distress.    Appearance: Normal appearance. She is not ill-appearing, toxic-appearing or diaphoretic.  Eyes:     Conjunctiva/sclera: Conjunctivae normal.  Cardiovascular:     Rate and Rhythm: Normal rate and regular rhythm.     Pulses: Normal pulses.     Heart sounds: Normal heart sounds. No murmur heard.    No friction rub. No gallop.  Pulmonary:      Effort: Pulmonary effort is normal. No respiratory distress.     Breath sounds: Normal breath sounds. No stridor. No wheezing, rhonchi or rales.  Abdominal:     General: Bowel sounds are normal. There is no distension.     Palpations: Abdomen is soft.     Tenderness: There is no abdominal tenderness.  Musculoskeletal:     Right lower leg: No edema.     Left lower leg: No edema.  Skin:    Findings: No erythema or rash.  Neurological:     Mental Status: She is alert and oriented to person, place, and time.  Psychiatric:        Attention and Perception: Attention normal.        Mood and Affect: Mood is depressed. Affect is not tearful.        Speech: Speech normal.        Behavior: Behavior is withdrawn. Behavior is cooperative.       Results for orders placed or performed in visit on 07/20/23  Hemoglobin A1c  Result Value Ref Range   Hgb A1c MFr Bld 6.5 (H) 4.8 - 5.6 %   Est. average glucose Bld gHb Est-mCnc 140 mg/dL  CBC  Result Value Ref Range   WBC 5.0 3.4 - 10.8 x10E3/uL   RBC 4.54 3.77 - 5.28 x10E6/uL   Hemoglobin 13.7 11.1 - 15.9 g/dL   Hematocrit 16.1 09.6 - 46.6 %   MCV 90 79 - 97 fL   MCH 30.2 26.6 - 33.0 pg   MCHC 33.5 31.5 - 35.7 g/dL   RDW 04.5 40.9 - 81.1 %   Platelets 226 150 - 450 x10E3/uL  Comprehensive metabolic panel  Result Value Ref Range   Glucose 98 70 - 99 mg/dL   BUN 12 8 - 27 mg/dL   Creatinine, Ser 9.14 0.57 - 1.00 mg/dL   eGFR 61 >78 GN/FAO/1.30   BUN/Creatinine Ratio 13 12 - 28   Sodium 143 134 - 144 mmol/L   Potassium 4.5 3.5 - 5.2 mmol/L   Chloride 104 96 - 106 mmol/L   CO2 23 20 -  29 mmol/L   Calcium 10.0 8.7 - 10.3 mg/dL   Total Protein 7.0 6.0 - 8.5 g/dL   Albumin 4.5 3.7 - 4.7 g/dL   Globulin, Total 2.5 1.5 - 4.5 g/dL   Bilirubin Total 0.3 0.0 - 1.2 mg/dL   Alkaline Phosphatase 70 44 - 121 IU/L   AST 19 0 - 40 IU/L   ALT 12 0 - 32 IU/L    Assessment & Plan     Problem List Items Addressed This Visit       Cardiovascular  and Mediastinum   Postural dizziness with presyncope   Relevant Medications   metoprolol tartrate (LOPRESSOR) 25 MG tablet   Other Relevant Orders   Comprehensive metabolic panel (Completed)   Essential (primary) hypertension   Chronic  Blood pressure elevated today. On metoprolol 12.5 mg twice daily and losartan 50 mg daily. Reports occasional missed doses. Emphasized consistent medication adherence to prevent complications such as stroke or heart attack. - Refill metoprolol 12.5 mg twice daily - Refill losartan 50 mg daily -advised to monitor home BP measurements 1 hour after medications  - Educate on consistent medication adherence      Relevant Medications   metoprolol tartrate (LOPRESSOR) 25 MG tablet   Other Relevant Orders   Comprehensive metabolic panel (Completed)     Endocrine   Type 2 diabetes mellitus with complication, without long-term current use of insulin (HCC) - Primary   Chronic  Diabetes with last hemoglobin A1c elevated at 7.0 in January. Due for repeat A1c. Discussed maintaining blood sugar levels to prevent complications such as neuropathy, retinopathy, and cardiovascular disease. - Order hemoglobin A1c - Continue metformin 1000mg  BID  - DM foot exam completed today        Relevant Orders   Hemoglobin A1c (Completed)     Other   Frequent falls   82 year old with multiple strokes and frequent falls, often due to not using her walker. Recent fall caused a minor abrasion over the left brow. No headaches or loss of consciousness. Blood pressure elevated today but well-managed during PT/OT sessions. Discussed consistent walker use to prevent falls and potential need for more intensive care if falls continue. Explained risks of serious injuries like hemorrhagic strokes requiring hospitalization. - Refer to physical therapy with home health for endurance and strength training, and assessment for assistive devices - Educate on consistent walker use to prevent  falls - Order CBC - Order complete metabolic panel - Order hemoglobin A1c - Order thyroid levels -continue to follow up with PT in the home  -recommended regular use of the walker to help with balance -discussed if safety issues in the home with falls continue, pt may be deemed unsafe in the home and may need to escalate level of care for more supervision, both patient and her daughter voiced understanding and do not wish for the patient to live outside of her home at this time       Relevant Orders   CBC (Completed)   Comprehensive metabolic panel (Completed)   Depressed affect   Reports feeling 'all right' but lacks motivation for activities. On sertraline 150 mg daily and Wellbutrin 300 mg daily. Recent sertraline dosage increase by psychiatrist. Discussed self-care and personal well-being. Encouraged engagement in activities like reading and puzzles to improve mood. - Refill sertraline 150 mg daily - Continue Wellbutrin 300 mg daily - Encourage engagement in activities like reading and puzzles - Discuss self-care and personal well-being - continue to follow up with psychiatry as  previously scheduled       Relevant Medications   sertraline (ZOLOFT) 100 MG tablet   Other Visit Diagnoses       Immunization due       Relevant Orders   Flu Vaccine Trivalent High Dose (Fluad) (Completed)     Sequela of thrombotic cerebrovascular accident (CVA)       Relevant Medications   clopidogrel (PLAVIX) 75 MG tablet        General Health Maintenance Due for tetanus and shingles vaccines. Flu shot administered. Discussed timing and potential side effects of shingles vaccine, recommending a six-month wait before administration. - Recommend tetanus vaccine at pharmacy - Recommend shingles vaccine in six months      Return in about 3 months (around 10/18/2023) for DM, HTN, Depression.         [name], MD  [facility] [phone]  (phone) [phone] (fax)  [facility]

## 2023-07-21 LAB — COMPREHENSIVE METABOLIC PANEL
ALT: 12 [IU]/L (ref 0–32)
AST: 19 [IU]/L (ref 0–40)
Albumin: 4.5 g/dL (ref 3.7–4.7)
Alkaline Phosphatase: 70 [IU]/L (ref 44–121)
BUN/Creatinine Ratio: 13 (ref 12–28)
BUN: 12 mg/dL (ref 8–27)
Bilirubin Total: 0.3 mg/dL (ref 0.0–1.2)
CO2: 23 mmol/L (ref 20–29)
Calcium: 10 mg/dL (ref 8.7–10.3)
Chloride: 104 mmol/L (ref 96–106)
Creatinine, Ser: 0.94 mg/dL (ref 0.57–1.00)
Globulin, Total: 2.5 g/dL (ref 1.5–4.5)
Glucose: 98 mg/dL (ref 70–99)
Potassium: 4.5 mmol/L (ref 3.5–5.2)
Sodium: 143 mmol/L (ref 134–144)
Total Protein: 7 g/dL (ref 6.0–8.5)
eGFR: 61 mL/min/{1.73_m2} (ref >59)

## 2023-07-21 LAB — CBC
Hematocrit: 40.9 % (ref 34.0–46.6)
Hemoglobin: 13.7 g/dL (ref 11.1–15.9)
MCH: 30.2 pg (ref 26.6–33.0)
MCHC: 33.5 g/dL (ref 31.5–35.7)
MCV: 90 fL (ref 79–97)
Platelets: 226 10*3/uL (ref 150–450)
RBC: 4.54 x10E6/uL (ref 3.77–5.28)
RDW: 12.7 % (ref 11.7–15.4)
WBC: 5 10*3/uL (ref 3.4–10.8)

## 2023-07-21 LAB — HEMOGLOBIN A1C
Est. average glucose Bld gHb Est-mCnc: 140 mg/dL
Hgb A1c MFr Bld: 6.5 % — ABNORMAL HIGH (ref 4.8–5.6)

## 2023-07-22 DIAGNOSIS — R296 Repeated falls: Secondary | ICD-10-CM | POA: Insufficient documentation

## 2023-07-22 NOTE — Assessment & Plan Note (Addendum)
Chronic  Diabetes with last hemoglobin A1c elevated at 7.0 in January. Due for repeat A1c. Discussed maintaining blood sugar levels to prevent complications such as neuropathy, retinopathy, and cardiovascular disease. - Order hemoglobin A1c - Continue metformin 1000mg  BID  - DM foot exam completed today

## 2023-07-22 NOTE — Assessment & Plan Note (Signed)
Chronic  Blood pressure elevated today. On metoprolol 12.5 mg twice daily and losartan 50 mg daily. Reports occasional missed doses. Emphasized consistent medication adherence to prevent complications such as stroke or heart attack. - Refill metoprolol 12.5 mg twice daily - Refill losartan 50 mg daily -advised to monitor home BP measurements 1 hour after medications  - Educate on consistent medication adherence

## 2023-07-22 NOTE — Assessment & Plan Note (Signed)
Reports feeling 'all right' but lacks motivation for activities. On sertraline 150 mg daily and Wellbutrin 300 mg daily. Recent sertraline dosage increase by psychiatrist. Discussed self-care and personal well-being. Encouraged engagement in activities like reading and puzzles to improve mood. - Refill sertraline 150 mg daily - Continue Wellbutrin 300 mg daily - Encourage engagement in activities like reading and puzzles - Discuss self-care and personal well-being - continue to follow up with psychiatry as previously scheduled

## 2023-07-22 NOTE — Assessment & Plan Note (Signed)
82 year old with multiple strokes and frequent falls, often due to not using her walker. Recent fall caused a minor abrasion over the left brow. No headaches or loss of consciousness. Blood pressure elevated today but well-managed during PT/OT sessions. Discussed consistent walker use to prevent falls and potential need for more intensive care if falls continue. Explained risks of serious injuries like hemorrhagic strokes requiring hospitalization. - Refer to physical therapy with home health for endurance and strength training, and assessment for assistive devices - Educate on consistent walker use to prevent falls - Order CBC - Order complete metabolic panel - Order hemoglobin A1c - Order thyroid levels -continue to follow up with PT in the home  -recommended regular use of the walker to help with balance -discussed if safety issues in the home with falls continue, pt may be deemed unsafe in the home and may need to escalate level of care for more supervision, both patient and her daughter voiced understanding and do not wish for the patient to live outside of her home at this time

## 2023-07-23 DIAGNOSIS — M545 Low back pain, unspecified: Secondary | ICD-10-CM | POA: Diagnosis not present

## 2023-07-23 DIAGNOSIS — F03A3 Unspecified dementia, mild, with mood disturbance: Secondary | ICD-10-CM | POA: Diagnosis not present

## 2023-07-23 DIAGNOSIS — E119 Type 2 diabetes mellitus without complications: Secondary | ICD-10-CM | POA: Diagnosis not present

## 2023-07-23 DIAGNOSIS — I10 Essential (primary) hypertension: Secondary | ICD-10-CM | POA: Diagnosis not present

## 2023-07-23 DIAGNOSIS — G8929 Other chronic pain: Secondary | ICD-10-CM | POA: Diagnosis not present

## 2023-07-23 DIAGNOSIS — I998 Other disorder of circulatory system: Secondary | ICD-10-CM | POA: Diagnosis not present

## 2023-07-23 DIAGNOSIS — R4185 Anosognosia: Secondary | ICD-10-CM | POA: Diagnosis not present

## 2023-07-23 DIAGNOSIS — F03A4 Unspecified dementia, mild, with anxiety: Secondary | ICD-10-CM | POA: Diagnosis not present

## 2023-07-23 DIAGNOSIS — G5793 Unspecified mononeuropathy of bilateral lower limbs: Secondary | ICD-10-CM | POA: Diagnosis not present

## 2023-07-27 DIAGNOSIS — E119 Type 2 diabetes mellitus without complications: Secondary | ICD-10-CM | POA: Diagnosis not present

## 2023-07-27 DIAGNOSIS — M545 Low back pain, unspecified: Secondary | ICD-10-CM | POA: Diagnosis not present

## 2023-07-27 DIAGNOSIS — I10 Essential (primary) hypertension: Secondary | ICD-10-CM | POA: Diagnosis not present

## 2023-07-27 DIAGNOSIS — F03A3 Unspecified dementia, mild, with mood disturbance: Secondary | ICD-10-CM | POA: Diagnosis not present

## 2023-07-27 DIAGNOSIS — G8929 Other chronic pain: Secondary | ICD-10-CM | POA: Diagnosis not present

## 2023-07-27 DIAGNOSIS — I998 Other disorder of circulatory system: Secondary | ICD-10-CM | POA: Diagnosis not present

## 2023-07-27 DIAGNOSIS — R4185 Anosognosia: Secondary | ICD-10-CM | POA: Diagnosis not present

## 2023-07-27 DIAGNOSIS — F03A4 Unspecified dementia, mild, with anxiety: Secondary | ICD-10-CM | POA: Diagnosis not present

## 2023-07-27 DIAGNOSIS — G5793 Unspecified mononeuropathy of bilateral lower limbs: Secondary | ICD-10-CM | POA: Diagnosis not present

## 2023-08-09 DIAGNOSIS — H40153 Residual stage of open-angle glaucoma, bilateral: Secondary | ICD-10-CM | POA: Diagnosis not present

## 2023-08-10 ENCOUNTER — Other Ambulatory Visit (HOSPITAL_COMMUNITY): Payer: Self-pay

## 2023-08-13 ENCOUNTER — Other Ambulatory Visit: Payer: Self-pay

## 2023-08-13 ENCOUNTER — Other Ambulatory Visit (HOSPITAL_COMMUNITY): Payer: Self-pay

## 2023-08-13 MED ORDER — PANTOPRAZOLE SODIUM 40 MG PO TBEC
40.0000 mg | DELAYED_RELEASE_TABLET | Freq: Two times a day (BID) | ORAL | 3 refills | Status: DC
  Filled 2023-08-13: qty 180, 90d supply, fill #0
  Filled 2023-12-20: qty 180, 90d supply, fill #1
  Filled 2024-03-25: qty 180, 90d supply, fill #2
  Filled 2024-06-16 – 2024-06-23 (×2): qty 180, 90d supply, fill #3

## 2023-08-13 MED ORDER — DONEPEZIL HCL 5 MG PO TABS
5.0000 mg | ORAL_TABLET | Freq: Every day | ORAL | 2 refills | Status: DC
  Filled 2023-08-13: qty 30, 30d supply, fill #0

## 2023-08-13 MED ORDER — ATORVASTATIN CALCIUM 40 MG PO TABS
40.0000 mg | ORAL_TABLET | Freq: Every evening | ORAL | 3 refills | Status: DC
  Filled 2023-08-13: qty 90, 90d supply, fill #0
  Filled 2023-12-20: qty 90, 90d supply, fill #1
  Filled 2024-03-25: qty 90, 90d supply, fill #2
  Filled 2024-06-16 – 2024-06-23 (×2): qty 90, 90d supply, fill #3

## 2023-08-13 MED ORDER — METOPROLOL TARTRATE 25 MG PO TABS
12.5000 mg | ORAL_TABLET | Freq: Two times a day (BID) | ORAL | 2 refills | Status: DC
Start: 2023-08-13 — End: 2024-06-16
  Filled 2023-08-13: qty 90, 90d supply, fill #0
  Filled 2023-12-20: qty 90, 90d supply, fill #1
  Filled 2024-03-25: qty 90, 90d supply, fill #2

## 2023-08-13 MED ORDER — ACCU-CHEK AVIVA PLUS VI STRP
ORAL_STRIP | 0 refills | Status: DC
  Filled 2023-08-13: qty 100, 100d supply, fill #0

## 2023-08-13 MED ORDER — CYANOCOBALAMIN 500 MCG PO TABS
500.0000 ug | ORAL_TABLET | Freq: Every day | ORAL | 3 refills | Status: AC
  Filled 2023-08-13: qty 30, 30d supply, fill #0
  Filled 2023-12-20: qty 30, 30d supply, fill #1
  Filled 2024-03-25: qty 30, 30d supply, fill #2
  Filled 2024-06-16 – 2024-06-23 (×2): qty 30, 30d supply, fill #3

## 2023-08-13 MED ORDER — GABAPENTIN 100 MG PO CAPS
100.0000 mg | ORAL_CAPSULE | Freq: Two times a day (BID) | ORAL | 1 refills | Status: DC
  Filled 2023-08-13 – 2024-03-25 (×2): qty 60, 30d supply, fill #0
  Filled 2024-06-16 – 2024-06-23 (×2): qty 60, 30d supply, fill #1

## 2023-08-13 MED ORDER — ONETOUCH DELICA PLUS LANCET33G MISC
0 refills | Status: DC
  Filled 2023-08-13: qty 100, 100d supply, fill #0

## 2023-08-13 MED ORDER — SERTRALINE HCL 100 MG PO TABS
150.0000 mg | ORAL_TABLET | Freq: Every day | ORAL | 1 refills | Status: DC
Start: 2023-08-13 — End: 2024-03-25
  Filled 2023-08-13: qty 135, 90d supply, fill #0
  Filled 2023-12-20: qty 135, 90d supply, fill #1

## 2023-08-13 MED ORDER — LOSARTAN POTASSIUM 50 MG PO TABS
50.0000 mg | ORAL_TABLET | Freq: Every day | ORAL | 3 refills | Status: DC
  Filled 2023-08-13: qty 90, 90d supply, fill #0
  Filled 2023-12-20: qty 90, 90d supply, fill #1
  Filled 2024-03-25: qty 90, 90d supply, fill #2
  Filled 2024-06-16 – 2024-06-23 (×2): qty 90, 90d supply, fill #3

## 2023-08-13 MED ORDER — CLOPIDOGREL BISULFATE 75 MG PO TABS
75.0000 mg | ORAL_TABLET | Freq: Every day | ORAL | 2 refills | Status: DC
Start: 2023-08-13 — End: 2024-06-16
  Filled 2023-08-13: qty 90, 90d supply, fill #0
  Filled 2023-12-20: qty 90, 90d supply, fill #1
  Filled 2024-03-25: qty 90, 90d supply, fill #2

## 2023-08-13 MED ORDER — BUPROPION HCL ER (XL) 300 MG PO TB24
300.0000 mg | ORAL_TABLET | Freq: Every day | ORAL | 1 refills | Status: DC
  Filled 2023-08-13: qty 90, 90d supply, fill #0
  Filled 2023-12-20: qty 90, 90d supply, fill #1

## 2023-08-13 NOTE — Addendum Note (Signed)
 Addended by: Bing Neighbors on: 08/13/2023 09:57 AM   Modules accepted: Orders

## 2023-08-13 NOTE — Telephone Encounter (Signed)
 Copied from CRM [id number]. Topic: General - Other >> Aug 10, 2023  1:16 PM Rosaria E wrote: Reason for CRM: Pt's daughter called reporting that she was told by the pharmacy that Dr. Adaline must call [facility] and approve all Rx to get sent there.

## 2023-08-13 NOTE — Telephone Encounter (Signed)
 Remaining prescriptions sent to [facility]

## 2023-08-13 NOTE — Addendum Note (Signed)
 Addended by: [name] L on: 08/13/2023 10:00 AM   Modules accepted: Orders

## 2023-08-14 ENCOUNTER — Other Ambulatory Visit: Payer: Self-pay

## 2023-08-18 NOTE — Progress Notes (Signed)
 [facility] MD/PA/NP OP Progress Note  08/21/2023 5:45 PM Leah Schaefer  MRN:  [id number]  Chief Complaint:  Chief Complaint  Patient presents with   Follow-up   HPI:  This is a follow-up appointment for depression, neurocognitive disorder.  She initially asks to be interviewed by herself.  She states that she has been doing much better.  She is hoping to walk.  She is seen by physical therapy, and has been working on exercise.  She has not been doing puzzle for 3 months.  When she is asked about the reason, yeah I like it, and I am ready to do something.  She reports VH of seeing people when she wakes up.  She sees bunch of people all of a sudden. She denies AH.  She reports fair relationship with her daughter.   Leah Schaefer, her daughter was later invited to join the interview with the patient consent (Leah Schaefer agreed her to join without any hesitance when she was asked again) Leah Schaefer states that she has been exercise she learned from physical therapy, which is great.  However, she is concerned that Leah Schaefer has been picking her skin Leah Schaefer states that she feels nervous at times when she does this). She wakes up at night, seeing people outside of the porch at night. She tends to take a nap during the day, although it has been less compared to before. Leah Schaefer asks if any other type of medication can be added to address these concerns.   Support: Household: daughter Marital status:widow (34 years of marriage. 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 It aws alright, good support   Substance use   Tobacco Alcohol  Other substances/  Current   denies denies  Past     denies  Past Treatment             Visit Diagnosis:    ICD-10-CM   1. MDD (major depressive disorder), recurrent episode, mild (HCC)  F33.0     2. Mild neurocognitive disorder   G31.84       Past Psychiatric History: Please see initial evaluation for full details. I have reviewed the history. No updates at this time.     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 Chemotherapy/post wide excision whole breast radiation.   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    Personal history of chemotherapy 2012/08/04  LEFT BREAST   Personal history of malignant neoplasm of breast    Personal history of radiation therapy 2017    Rt, 2013 had on left   Stroke (HCC) 09/06/2020   1 cm acute infarction in the right parietal deep and subcortical white matter   Vertigo     Past Surgical History:  Procedure Laterality Date   BREAST BIOPSY Left 07/18/2011   +   BREAST BIOPSY Left 09/21/2015   neg   BREAST BIOPSY Left 04/05/2016   neg   BREAST BIOPSY Left 03/20/2019   us  bx 2 areas /fat necrosis at 9:30 ribbon and 5:30 coil   BREAST EXCISIONAL BIOPSY Right 08/2015   [facility]   BREAST LUMPECTOMY Right 09/30/2015   [facility] WITH MICROPAPILLARY FEATURES AND DCIS/CLEAR MARGINS, NEGATIVE LN   BREAST LUMPECTOMY Left 08/17/2011   BREAST LUMPECTOMY WITH  SENTINEL LYMPH NODE BIOPSY Right 09/30/2015   Procedure: BREAST LUMPECTOMY WITH SENTINEL LYMPH NODE BX;  Surgeon: Reyes LELON Cota, MD;  Location: [facility] ORS;  Service: General;  Laterality: Right;   BREAST SURGERY Left 2012   wide excision   BREAST SURGERY Left November 2013   Core biopsy of the upper-outer quadrant showed fat necrosis.   CHOLECYSTECTOMY  2007   COLONOSCOPY  2011   Dr Ora   COLONOSCOPY WITH PROPOFOL  N/A 02/25/2015   Procedure: COLONOSCOPY WITH PROPOFOL ;  Surgeon: Deward CINDERELLA Ora, MD;  Location: [facility] ENDOSCOPY;  Service: Gastroenterology;  Laterality: N/A;   CYSTOSCOPY WITH STENT PLACEMENT Left 11/05/2020   Procedure: CYSTOSCOPY WITH STENT PLACEMENT;  Surgeon: Twylla Glendia BROCKS, MD;  Location: [facility] ORS;  Service: Urology;  Laterality: Left;   CYSTOSCOPY/URETEROSCOPY/HOLMIUM LASER/STENT PLACEMENT Left 12/07/2020   Procedure: CYSTOSCOPY/URETEROSCOPY/HOLMIUM LASER/STENT PLACEMENT;  Surgeon: Twylla Glendia BROCKS, MD;  Location: [facility] ORS;  Service: Urology;  Laterality: Left;   DILATION AND CURETTAGE OF UTERUS  2004   HERNIA REPAIR Right 09/10/2007   Umbilical/ventral hernia repaired with 6.4 cm Proceed ventral patch   PORT-A-CATH REMOVAL     PORTACATH PLACEMENT  2013   RECURRENT HERNIA N/A 01/07/2008   Recurrent ventral hernia at the umbilicus, laparoscopy, open placement of a large Ultra Pro mesh with trans-fascial sutures.   UPPER GI ENDOSCOPY  2011    Family Psychiatric History: Please see initial evaluation for full details. I have reviewed the history. No updates at this time.     Family History:  Family History  Problem Relation Age of Onset   Lymphoma Father    Ovarian cancer Sister    Colon cancer Brother    Lymphoma Brother    Cancer Other        stomach   Cancer Other        stomach   Breast cancer Neg Hx     Social History:  Social History   Socioeconomic History   Marital status: Widowed    Spouse name: Not on file   Number of children: 2   Years of education:  H/S   Highest education level: 12th grade  Occupational History   Occupation: Retired  Tobacco Use   Smoking status: Never   Smokeless tobacco: Never  Vaping Use   Vaping status: Never Used  Substance and Sexual Activity   Alcohol  use: No   Drug use: No   Sexual activity: Not Currently  Other Topics Concern   Not on file  Social History Narrative   Lives alone   Social Drivers of Health   Financial Resource Strain: Low Risk  (10/25/2021)  Overall Financial Resource Strain (CARDIA)    Difficulty of Paying Living Expenses: Not hard at all  Food Insecurity: No Food Insecurity (04/12/2023)   Hunger Vital Sign    Worried About Running Out of Food in the Last Year: Never true    Ran Out of Food in the Last Year: Never true  Transportation Needs: No Transportation Needs (04/12/2023)   PRAPARE - Administrator, Civil Service (Medical): No    Lack of Transportation (Non-Medical): No  Physical Activity: Inactive (10/30/2022)   Exercise Vital Sign    Days of Exercise per Week: 0 days    Minutes of Exercise per Session: 0 min  Stress: No Stress Concern Present (10/30/2022)   [employer] of Occupational Health - Occupational Stress Questionnaire    Feeling of Stress : Not at all  Social Connections: Moderately Isolated (10/30/2022)   Social Connection and Isolation Panel [NHANES]    Frequency of Communication with Friends and Family: More than three times a week    Frequency of Social Gatherings with Friends and Family: More than three times a week    Attends Religious Services: 1 to 4 times per year    Active Member of [employer] or Organizations: No    Attends Banker Meetings: Never    Marital Status: Widowed    Allergies: No Known Allergies  Metabolic Disorder Labs: Lab Results  Component Value Date   HGBA1C 6.5 (H) 07/20/2023   MPG 171.42 09/07/2020   MPG 157.07 11/07/2018   No results found for: PROLACTIN Lab Results  Component Value Date    CHOL 110 10/14/2021   TRIG 151 (H) 10/14/2021   HDL 33 (L) 10/14/2021   CHOLHDL 3.3 10/14/2021   VLDL 18 09/07/2020   LDLCALC 51 10/14/2021   LDLCALC 57 09/07/2020   Lab Results  Component Value Date   TSH 2.583 06/25/2023   TSH 2.110 10/14/2021    Therapeutic Level Labs: No results found for: LITHIUM No results found for: VALPROATE No results found for: CBMZ  Current Medications: Current Outpatient Medications  Medication Sig Dispense Refill   acetaminophen  (TYLENOL ) 500 MG tablet Take 500 mg by mouth every 6 (six) hours as needed for mild pain or headache.      atorvastatin  (LIPITOR) 40 MG tablet Take 1 tablet (40 mg total) by mouth every evening. 90 tablet 3   buPROPion  (WELLBUTRIN  XL) 300 MG 24 hr tablet Take 1 tablet (300 mg total) by mouth daily. 90 tablet 1   Calcium  Carb-Cholecalciferol  600-800 MG-UNIT TABS Take 1 tablet by mouth daily.     cholecalciferol  (VITAMIN D ) 1000 units tablet Take 1,000 Units by mouth daily.     clopidogrel  (PLAVIX ) 75 MG tablet Take 1 tablet (75 mg total) by mouth daily. 90 tablet 2   cyanocobalamin  (VITAMIN B12) 500 MCG tablet Take 1 tablet (500 mcg total) by mouth daily. 30 tablet 3   donepezil  (ARICEPT ) 5 MG tablet Take 1 tablet (5 mg total) by mouth at bedtime. 30 tablet 2   fluticasone  (FLONASE ) 50 MCG/ACT nasal spray Place 2 sprays into both nostrils daily. 16 g 6   gabapentin  (NEURONTIN ) 100 MG capsule Take 1 capsule (100 mg total) by mouth 2 (two) times daily. 60 capsule 1   glucose blood (ACCU-CHEK AVIVA PLUS) test strip Use as instructed 100 strip 0   Lancets (ONETOUCH DELICA PLUS LANCET33G) MISC Use as instructed 100 each 0   latanoprost  (XALATAN ) 0.005 % ophthalmic solution Place 1 drop into  both eyes at bedtime.      losartan  (COZAAR ) 50 MG tablet Take 1 tablet (50 mg total) by mouth daily. 90 tablet 3   metFORMIN  (GLUCOPHAGE ) 1000 MG tablet TAKE 1 TABLET TWICE DAILY WITH MEALS 180 tablet 3   metoprolol  tartrate (LOPRESSOR )  25 MG tablet Take 0.5 tablets (12.5 mg total) by mouth 2 (two) times daily. 90 tablet 2   Multiple Vitamin (MULTIVITAMIN WITH MINERALS) TABS tablet Take 1 tablet by mouth daily.     ondansetron  (ZOFRAN ) 4 MG tablet Take 1 tablet (4 mg total) by mouth every 8 (eight) hours as needed for nausea or vomiting. 25 tablet 0   ondansetron  (ZOFRAN -ODT) 8 MG disintegrating tablet Take by mouth as needed.     pantoprazole  (PROTONIX ) 40 MG tablet Take 1 tablet (40 mg total) by mouth 2 (two) times daily. 180 tablet 3   pyridoxine  (B-6) 100 MG tablet Take 100 mg by mouth daily.     sertraline  (ZOLOFT ) 100 MG tablet Take 1.5 tablets (150 mg total) by mouth daily. 135 tablet 1   traZODone  (DESYREL ) 50 MG tablet Take 0.5-1 tablets (25-50 mg total) by mouth at bedtime. 30 tablet 1   vitamin C (ASCORBIC ACID ) 500 MG tablet Take 500 mg by mouth daily.     vitamin E  400 UNIT capsule Take 400 Units by mouth daily.     No current facility-administered medications for this visit.     Musculoskeletal: Strength & Muscle Tone: within normal limits Gait & Station: normal Patient leans: N/A  Psychiatric Specialty Exam: Review of Systems  Psychiatric/Behavioral:  Positive for behavioral problems, confusion, dysphoric mood and sleep disturbance. Negative for agitation, decreased concentration, hallucinations, self-injury and suicidal ideas. The patient is not nervous/anxious and is not hyperactive.   All other systems reviewed and are negative.   Blood pressure 122/78, pulse 76, temperature (!) 97.5 F (36.4 C), temperature source Skin, height 5' 6 (1.676 m), weight 151 lb (68.5 kg).Body mass index is 24.37 kg/m.  General Appearance: Well Groomed  Eye Contact:  Good  Speech:  Clear and Coherent  Volume:  Normal  Mood:   better  Affect:  Blunt  Thought Process:  Coherent  Orientation:  Full (Time, Place, and Person)  Thought Content: Logical   Suicidal Thoughts:  No  Homicidal Thoughts:  No  Memory:   Immediate;   Good  Judgement:  Good  Insight:  Shallow  Psychomotor Activity:  Normal  Concentration:  Concentration: Fair and Attention Span: Fair  Recall:  Fair  Fund of Knowledge: Good  Language: Good  Akathisia:  No  Handed:  Right  AIMS (if indicated): not done  Assets:  Social Support  ADL's:  Intact  Cognition: WNL  Sleep:  Fair   Screenings: GAD-7    Garment/textile Technologist Visit from 07/20/2023 in [facility] Office Visit from 06/08/2023 in [facility]  Total GAD-7 Score 9 7      PHQ2-9    Flowsheet Row Office Visit from 07/20/2023 in [facility] Office Visit from 06/08/2023 in [facility] Office Visit from 02/02/2023 in [facility] Office Visit from 12/08/2022 in [facility] Clinical Support from 10/30/2022 in [facility]  PHQ-2 Total Score 4 6 2 6 3   PHQ-9 Total Score 16 17 7 13 8       Flowsheet Row ED from 12/15/2022 in [facility] Emergency Department  at [facility] ED from 11/28/2022 in [facility] Emergency Department at [facility] ED from 09/23/2022 in [facility] Emergency Department at [facility]  C-SSRS RISK CATEGORY No Risk No Risk No Risk        Assessment and Plan:  LEONILA SPERANZA is a 83 y.o. year old female with a history of depression, mixed dementia, frequent fall, history of 1 cm acute infarction in the right parietal deep and subcortical white matter causing left leg motor planning deficit, SVD, neuropathy, h/o breast cancer s/p chemotherapy, radiation, who is referred for depression.   1. MDD (major depressive disorder), recurrent episode, mild (HCC) Acute stressors include:  Other stressors include:  loss of her husband in 2020, s/p stroke. chemotherapy History: reportedly has a history of not telling the truth to provider, either to minimize,  or  falsely complaining symptoms There has been subjective and objective improvement in anhedonia since uptitration of sertraline , although she experiences some anxiety/concern of skin picking.  Will continue current dose at this time to target depression along with bupropion  as adjunctive treatment for depression, while also addressing sleep as part of the intervention.   2. Mild neurocognitive disorder # Insomnia Functional Status   IADL: Independent in the following:            Requires assistance with the following: managing finances, medications, driving ADL  Independent in the following: bathing and hygiene, feeding, continence, toileting, walking (walker)          Requires assistance with the following: grooming Folate, Vitamin B12, TSH Images Neuropsych assessment:  Etiology: vascular   NPS- VH (r/o hypnopompic), sundowning - on donepezil , seen by Dr. Maree Exam is notable for less disorganization during the visit.  However, collateral reports hallucinations, sundowning, although there is no concern about aggression.  Although she may benefit from antipsychotics, we will first try trazodone  fo see if it helps improve her sleep cycle and potentially alleviate mood symptoms by addressing insomnia.  Discussed potential risk of drowsiness.   # Hypersomnia Improving, although she has history of hypersomnia for many years.  She may benefit from stimulant in the future to address this and potential apathy. Will continue to assess.   Plan Continue sertraline  150 mg at night Continue bupropion  300 mg daily  Start trazodone  25-50 mg at night as needed for insomnia Next appointment: 3/20 at 3:30, IP   The patient demonstrates the following risk factors for suicide: Chronic risk factors for suicide include: psychiatric disorder of depression . Acute risk factors for suicide include: unemployment and loss (financial, interpersonal, professional). Protective factors for this patient include:  positive social support and hope for the future. Considering these factors, the overall suicide risk at this point appears to be low. Patient is appropriate for outpatient follow up.     Collaboration of Care: Collaboration of Care: Other reviewed notes in Epic  Patient/Guardian was advised Release of Information must be obtained prior to any record release in order to collaborate their care with an outside provider. Patient/Guardian was advised if they have not already done so to contact the registration department to sign all necessary forms in order for us  to release information regarding their care.   Consent: Patient/Guardian gives verbal consent for treatment and assignment of benefits for services provided during this visit. Patient/Guardian expressed understanding and agreed to proceed.   The duration of the time spent on the following activities on the date of the encounter was 40 minutes.   Preparing to  see the patient (e.g., review of test, records)  Obtaining and/or reviewing separately obtained history  Performing a medically necessary exam and/or evaluation  Counseling and educating the patient/family/caregiver  Ordering medications, tests, or procedures  Referring and communicating with other healthcare professionals (when not reported separately)  Documenting clinical information in the electronic or paper health record  Independently interpreting results of tests/labs and communication of results to the family or caregiver  Care coordination (when not reported separately)    Katheren Sleet, MD 08/21/2023, 5:45 PM

## 2023-08-21 ENCOUNTER — Ambulatory Visit (INDEPENDENT_AMBULATORY_CARE_PROVIDER_SITE_OTHER): Payer: Self-pay | Admitting: Psychiatry

## 2023-08-21 ENCOUNTER — Encounter: Payer: Self-pay | Admitting: Psychiatry

## 2023-08-21 VITALS — BP 122/78 | HR 76 | Temp 97.5°F | Ht 66.0 in | Wt 151.0 lb

## 2023-08-21 DIAGNOSIS — F330 Major depressive disorder, recurrent, mild: Secondary | ICD-10-CM | POA: Diagnosis not present

## 2023-08-21 DIAGNOSIS — G3184 Mild cognitive impairment, so stated: Secondary | ICD-10-CM | POA: Diagnosis not present

## 2023-08-21 MED ORDER — TRAZODONE HCL 50 MG PO TABS
25.0000 mg | ORAL_TABLET | Freq: Every day | ORAL | 1 refills | Status: DC
Start: 2023-08-21 — End: 2023-10-17

## 2023-08-21 NOTE — Patient Instructions (Signed)
 Continue sertraline 150 mg at night Continue bupropion 300 mg daily  Start trazodone 25-50 mg at night as needed for insomnia Next appointment: 3/20 at 3:30

## 2023-10-04 DIAGNOSIS — M1990 Unspecified osteoarthritis, unspecified site: Secondary | ICD-10-CM | POA: Diagnosis not present

## 2023-10-04 DIAGNOSIS — E785 Hyperlipidemia, unspecified: Secondary | ICD-10-CM | POA: Diagnosis not present

## 2023-10-04 DIAGNOSIS — F3341 Major depressive disorder, recurrent, in partial remission: Secondary | ICD-10-CM | POA: Diagnosis not present

## 2023-10-04 DIAGNOSIS — K219 Gastro-esophageal reflux disease without esophagitis: Secondary | ICD-10-CM | POA: Diagnosis not present

## 2023-10-04 DIAGNOSIS — I69311 Memory deficit following cerebral infarction: Secondary | ICD-10-CM | POA: Diagnosis not present

## 2023-10-04 DIAGNOSIS — K449 Diaphragmatic hernia without obstruction or gangrene: Secondary | ICD-10-CM | POA: Diagnosis not present

## 2023-10-04 DIAGNOSIS — E1142 Type 2 diabetes mellitus with diabetic polyneuropathy: Secondary | ICD-10-CM | POA: Diagnosis not present

## 2023-10-04 DIAGNOSIS — H409 Unspecified glaucoma: Secondary | ICD-10-CM | POA: Diagnosis not present

## 2023-10-04 DIAGNOSIS — I10 Essential (primary) hypertension: Secondary | ICD-10-CM | POA: Diagnosis not present

## 2023-10-04 DIAGNOSIS — E1169 Type 2 diabetes mellitus with other specified complication: Secondary | ICD-10-CM | POA: Diagnosis not present

## 2023-10-04 DIAGNOSIS — E1139 Type 2 diabetes mellitus with other diabetic ophthalmic complication: Secondary | ICD-10-CM | POA: Diagnosis not present

## 2023-10-17 ENCOUNTER — Other Ambulatory Visit: Payer: Self-pay | Admitting: Psychiatry

## 2023-10-21 NOTE — Progress Notes (Unsigned)
 [facility] MD/PA/NP OP Progress Note  10/25/2023 3:59 PM Leah Schaefer  MRN:  [id number]  Chief Complaint:  Chief Complaint  Patient presents with   Follow-up   HPI:  This is a follow-up appointment for depression, insomnia, neurocognitive disorder. She states that she has been doing better.  She is able to get up in the morning and walk around.  She is not laying down as she used to.  She enjoys using a puzzle book.  She has occasional middle insomnia, but it has been improving since starting trazodone.  She does not feel tired as much.  She feels a little worried about her family, health.  She denies panic attacks.  She denies SI.  She denies hallucinations.  When she is asked about skin picking, states that she is doing it likely as a habit.  She is willing to try a squeeze ball.   Leah Schaefer presents to the visit with patient consent.  The mother agrees that she has been sleeping better.  She does not appear to be confused as much compared to before.  It now only occurs when she wakes up in the morning.  She feels frustrated that [name] has been taking her skin.  She will not do it when Leah Schaefer is looking at her. However, it is constant, and she does the majority of the day.      Wt Readings from Last 3 Encounters:  10/25/23 148 lb 12.8 oz (67.5 kg)  08/21/23 151 lb (68.5 kg)  07/20/23 148 lb 9.6 oz (67.4 kg)     Support: Household: daughter Marital status:widow (34 years of marriage. 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 It aws "alright", good support   Substance use   Tobacco Alcohol Other substances/  Current   denies denies  Past     denies  Past Treatment             Visit Diagnosis:    ICD-10-CM   1. MDD (major depressive disorder), recurrent episode, mild (HCC)  F33.0     2. Mild neurocognitive disorder  G31.84      3. Insomnia, unspecified type  G47.00       Past Psychiatric History: Please see initial evaluation for full details. I have reviewed the history. No updates at this time.     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 Chemotherapy/post wide excision  whole breast radiation.   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    Personal history of chemotherapy 2013   LEFT BREAST   Personal history of malignant neoplasm of breast    Personal history of radiation therapy 2017    Rt, 2013 had on left   Stroke (HCC) 09/06/2020   1 cm acute infarction in the right parietal deep and subcortical white matter   Vertigo     Past Surgical History:  Procedure Laterality Date   BREAST BIOPSY Left 07/18/2011   +   BREAST BIOPSY Left 09/21/2015   neg   BREAST BIOPSY Left 04/05/2016   neg   BREAST BIOPSY Left 03/20/2019   [place] bx 2 areas /fat necrosis at 9:30 ribbon and 5:30 coil   BREAST EXCISIONAL BIOPSY Right 08/2015   [facility]   BREAST LUMPECTOMY Right 09/30/2015   [facility] WITH MICROPAPILLARY FEATURES AND DCIS/CLEAR MARGINS, NEGATIVE LN   BREAST LUMPECTOMY Left  08/17/2011   BREAST LUMPECTOMY WITH SENTINEL LYMPH NODE BIOPSY Right 09/30/2015   Procedure: BREAST LUMPECTOMY WITH SENTINEL LYMPH NODE BX;  Surgeon: Earline Mayotte, MD;  Location: [facility] ORS;  Service: General;  Laterality: Right;   BREAST SURGERY Left 2012   wide excision   BREAST SURGERY Left November 2013   Core biopsy of the upper-outer quadrant showed fat necrosis.   CHOLECYSTECTOMY  2007   COLONOSCOPY  2011   Dr Bluford Kaufmann   COLONOSCOPY WITH PROPOFOL N/A 02/25/2015   Procedure: COLONOSCOPY WITH PROPOFOL;  Surgeon: Wallace Cullens, MD;  Location: [facility] ENDOSCOPY;  Service: Gastroenterology;  Laterality: N/A;   CYSTOSCOPY WITH STENT PLACEMENT Left 11/05/2020   Procedure: CYSTOSCOPY WITH STENT PLACEMENT;  Surgeon: Riki Altes, MD;  Location: [facility] ORS;  Service: Urology;  Laterality: Left;   CYSTOSCOPY/URETEROSCOPY/HOLMIUM LASER/STENT PLACEMENT Left 12/07/2020   Procedure: CYSTOSCOPY/URETEROSCOPY/HOLMIUM LASER/STENT PLACEMENT;  Surgeon: Riki Altes, MD;  Location: [facility] ORS;  Service: Urology;  Laterality: Left;   DILATION AND CURETTAGE OF UTERUS  2004   HERNIA REPAIR Right 09/10/2007   Umbilical/ventral hernia repaired with 6.4 cm Proceed ventral patch   PORT-A-CATH REMOVAL     PORTACATH PLACEMENT  2013   RECURRENT HERNIA N/A 01/07/2008   Recurrent ventral hernia at the umbilicus, laparoscopy, open placement of a large Ultra Pro mesh with trans-fascial sutures.   UPPER GI ENDOSCOPY  2011    Family Psychiatric History: Please see initial evaluation for full details. I have reviewed the history. No updates at this time.     Family History:  Family History  Problem Relation Age of Onset   Lymphoma Father    Ovarian cancer Sister    Colon cancer Brother    Lymphoma Brother    Cancer Other        stomach   Cancer Other        stomach   Breast cancer Neg Hx     Social History:  Social History   Socioeconomic History   Marital status: Widowed    Spouse name: Not on file   Number of  children: 2   Years of education: H/S   Highest education level: 12th grade  Occupational History   Occupation: Retired  Tobacco Use   Smoking status: Never   Smokeless tobacco: Never  Vaping Use   Vaping status: Never Used  Substance and Sexual Activity   Alcohol use: No   Drug use: No   Sexual activity: Not Currently  Other Topics Concern  Not on file  Social History Narrative   Lives alone   Social Drivers of Health   Financial Resource Strain: Low Risk  (10/25/2021)   Overall Financial Resource Strain (CARDIA)    Difficulty of Paying Living Expenses: Not hard at all  Food Insecurity: No Food Insecurity (04/12/2023)   Hunger Vital Sign    Worried About Running Out of Food in the Last Year: Never true    Ran Out of Food in the Last Year: Never true  Transportation Needs: No Transportation Needs (04/12/2023)   PRAPARE - Administrator, Civil Service (Medical): No    Lack of Transportation (Non-Medical): No  Physical Activity: Inactive (10/30/2022)   Exercise Vital Sign    Days of Exercise per Week: 0 days    Minutes of Exercise per Session: 0 min  Stress: No Stress Concern Present (10/30/2022)   [employer] of Occupational Health - Occupational Stress Questionnaire    Feeling of Stress : Not at all  Social Connections: Moderately Isolated (10/30/2022)   Social Connection and Isolation Panel [NHANES]    Frequency of Communication with Friends and Family: More than three times a week    Frequency of Social Gatherings with Friends and Family: More than three times a week    Attends Religious Services: 1 to 4 times per year    Active Member of [employer] or Organizations: No    Attends Banker Meetings: Never    Marital Status: Widowed    Allergies: No Known Allergies  Metabolic Disorder Labs: Lab Results  Component Value Date   HGBA1C 6.5 (H) 07/20/2023   MPG 171.42 09/07/2020   MPG 157.07 11/07/2018   No results found for: "PROLACTIN" Lab  Results  Component Value Date   CHOL 110 10/14/2021   TRIG 151 (H) 10/14/2021   HDL 33 (L) 10/14/2021   CHOLHDL 3.3 10/14/2021   VLDL 18 09/07/2020   LDLCALC 51 10/14/2021   LDLCALC 57 09/07/2020   Lab Results  Component Value Date   TSH 2.583 06/25/2023   TSH 2.110 10/14/2021    Therapeutic Level Labs: No results found for: "LITHIUM" No results found for: "VALPROATE" No results found for: "CBMZ"  Current Medications: Current Outpatient Medications  Medication Sig Dispense Refill   acetaminophen (TYLENOL) 500 MG tablet Take 500 mg by mouth every 6 (six) hours as needed for mild pain or headache.      atorvastatin (LIPITOR) 40 MG tablet Take 1 tablet (40 mg total) by mouth every evening. 90 tablet 3   buPROPion (WELLBUTRIN XL) 300 MG 24 hr tablet Take 1 tablet (300 mg total) by mouth daily. 90 tablet 1   Calcium Carb-Cholecalciferol 600-800 MG-UNIT TABS Take 1 tablet by mouth daily.     cholecalciferol (VITAMIN D) 1000 units tablet Take 1,000 Units by mouth daily.     clopidogrel (PLAVIX) 75 MG tablet Take 1 tablet (75 mg total) by mouth daily. 90 tablet 2   cyanocobalamin (VITAMIN B12) 500 MCG tablet Take 1 tablet (500 mcg total) by mouth daily. 30 tablet 3   donepezil (ARICEPT) 5 MG tablet Take 1 tablet (5 mg total) by mouth at bedtime. 30 tablet 2   fluticasone (FLONASE) 50 MCG/ACT nasal spray Place 2 sprays into both nostrils daily. 16 g 6   gabapentin (NEURONTIN) 100 MG capsule Take 1 capsule (100 mg total) by mouth 2 (two) times daily. 60 capsule 1   glucose blood (ACCU-CHEK AVIVA PLUS) test strip Use as instructed 100  strip 0   Lancets (ONETOUCH DELICA PLUS LANCET33G) MISC Use as instructed 100 each 0   latanoprost (XALATAN) 0.005 % ophthalmic solution Place 1 drop into both eyes at bedtime.      losartan (COZAAR) 50 MG tablet Take 1 tablet (50 mg total) by mouth daily. 90 tablet 3   metFORMIN (GLUCOPHAGE) 1000 MG tablet TAKE 1 TABLET TWICE DAILY WITH MEALS 180 tablet 3    metoprolol tartrate (LOPRESSOR) 25 MG tablet Take 0.5 tablets (12.5 mg total) by mouth 2 (two) times daily. 90 tablet 2   Multiple Vitamin (MULTIVITAMIN WITH MINERALS) TABS tablet Take 1 tablet by mouth daily.     ondansetron (ZOFRAN) 4 MG tablet Take 1 tablet (4 mg total) by mouth every 8 (eight) hours as needed for nausea or vomiting. 25 tablet 0   ondansetron (ZOFRAN-ODT) 8 MG disintegrating tablet Take by mouth as needed.     pantoprazole (PROTONIX) 40 MG tablet Take 1 tablet (40 mg total) by mouth 2 (two) times daily. 180 tablet 3   pyridoxine (B-6) 100 MG tablet Take 100 mg by mouth daily.     sertraline (ZOLOFT) 100 MG tablet Take 1.5 tablets (150 mg total) by mouth daily. 135 tablet 1   vitamin C (ASCORBIC ACID) 500 MG tablet Take 500 mg by mouth daily.     vitamin E 400 UNIT capsule Take 400 Units by mouth daily.     [START ON 11/16/2023] traZODone (DESYREL) 50 MG tablet Take 0.5 tablets (25 mg total) by mouth at bedtime as needed for sleep. 45 tablet 1   No current facility-administered medications for this visit.     Musculoskeletal: Strength & Muscle Tone: within normal limits Gait & Station:  uses a walker Patient leans: N/A  Psychiatric Specialty Exam: Review of Systems  Psychiatric/Behavioral:  Positive for dysphoric mood. Negative for agitation, behavioral problems, confusion, decreased concentration, hallucinations, self-injury, sleep disturbance and suicidal ideas. The patient is nervous/anxious. The patient is not hyperactive.   All other systems reviewed and are negative.   Blood pressure 132/81, pulse 71, temperature (!) 95.7 F (35.4 C), temperature source Skin, height 5\' 6"  (1.676 m), weight 148 lb 12.8 oz (67.5 kg).Body mass index is 24.02 kg/m.  General Appearance: Well Groomed  Eye Contact:  Good  Speech:  Clear and Coherent  Volume:  Normal  Mood:   better  Affect:  Appropriate, Congruent, and less restricted, smiles  Thought Process:  Coherent   Orientation:  Full (Time, Place, and Person)  Thought Content: Logical   Suicidal Thoughts:  No  Homicidal Thoughts:  No  Memory:  Immediate;   Good  Judgement:  Good  Insight:  Good  Psychomotor Activity:  Normal  Concentration:  Concentration: Good and Attention Span: Good  Recall:  Good  Fund of Knowledge: Good  Language: Good  Akathisia:  No  Handed:  Right  AIMS (if indicated): not done  Assets:  Communication Skills Desire for Improvement  ADL's:  Intact  Cognition: WNL  Sleep:  Good   Screenings: GAD-7    Flowsheet Row Office Visit from 07/20/2023 in [facility] Office Visit from 06/08/2023 in [facility]  Total GAD-7 Score 9 7      PHQ2-9    Flowsheet Row Office Visit from 07/20/2023 in [facility] Office Visit from 06/08/2023 in [facility] Office Visit from 02/02/2023 in [facility] Office Visit from 12/08/2022 in [facility] Clinical Support from 10/30/2022 in [facility]  PHQ-2 Total Score 4 6 2 6 3   PHQ-9 Total Score 16 17 7 13 8       Flowsheet Row ED from 12/15/2022 in [facility] ED from 11/28/2022 in [facility] Department at [facility] ED from 09/23/2022 in [facility] at [facility]  C-SSRS RISK CATEGORY No Risk No Risk No Risk        Assessment and Plan:  MARIATERESA BATRA is a 83 y.o. year old female with a history of depression, mixed dementia, frequent fall, history of 1 cm acute infarction in the right parietal deep and subcortical white matter causing left leg motor planning deficit, SVD, neuropathy, h/o breast cancer s/p chemotherapy, radiation, who is referred for depression.   1. MDD (major depressive disorder), recurrent episode, mild (HCC) Acute stressors include:  Other  stressors include:  loss of her husband in 2020, s/p stroke. chemotherapy History: reportedly has a history of not telling the truth to provider, either to minimize, or  falsely complaining symptoms There have been consistent improvement in depressive symptoms, anhedonia, hypersomnia since uptitration of sertraline.  Will continue current dose to target depression along with bupropion as adjunctive treatment for depression.   # skin picking  Her daughter is concerned about her skin picking.  She is willing to try a squeeze ball.  Although she may benefit from adjustment of psychotropic, both the patient and her daughter agrees to stay on the current medication regimen to mitigate any risk, and work on behavior intervention.   2. Mild neurocognitive disorder 3. Insomnia, unspecified type Functional Status   IADL: Independent in the following:            Requires assistance with the following: managing finances, medications, driving ADL  Independent in the following: bathing and hygiene, feeding, continence, toileting, walking (walker)          Requires assistance with the following: grooming Folate, Vitamin B12, TSH Images Neuropsych assessment:  Etiology: vascular   NPS- VH (r/o hypnopompic), sundowning- improving - on donepezil, seen by Dr. Sherryll Burger She is more organized during the visit.  There has been improvement in hallucinations, sundowning episodes since starting trazodone.  Will continue current dose to target insomnia.    Plan Continue sertraline 150 mg at night Continue bupropion 300 mg daily  Continue trazodone 25 mg at night as needed for insomnia Next appointment: 5/15 at 3:30, IP   The patient demonstrates the following risk factors for suicide: Chronic risk factors for suicide include: psychiatric disorder of depression . Acute risk factors for suicide include: unemployment and loss (financial, interpersonal, professional). Protective factors for this patient include: positive  social support and hope for the future. Considering these factors, the overall suicide risk at this point appears to be low. Patient is appropriate for outpatient follow up.     Collaboration of Care: Collaboration of Care: Other reviewed notes in Epic  Patient/Guardian was advised Release of Information must be obtained prior to any record release in order to collaborate their care with an outside provider. Patient/Guardian was advised if they have not already done so to contact the registration department to sign all necessary forms in order for [place] to release information regarding their care.   Consent: Patient/Guardian gives verbal consent for treatment and assignment of benefits for services provided during this visit. Patient/Guardian expressed understanding and agreed to proceed.  Neysa Hotter, MD 10/25/2023, 3:59 PM

## 2023-10-25 ENCOUNTER — Other Ambulatory Visit (HOSPITAL_COMMUNITY): Payer: Self-pay

## 2023-10-25 ENCOUNTER — Ambulatory Visit (INDEPENDENT_AMBULATORY_CARE_PROVIDER_SITE_OTHER): Payer: HMO | Admitting: Psychiatry

## 2023-10-25 ENCOUNTER — Other Ambulatory Visit: Payer: Self-pay

## 2023-10-25 ENCOUNTER — Encounter: Payer: Self-pay | Admitting: Psychiatry

## 2023-10-25 VITALS — BP 132/81 | HR 71 | Temp 95.7°F | Ht 66.0 in | Wt 148.8 lb

## 2023-10-25 DIAGNOSIS — G3184 Mild cognitive impairment, so stated: Secondary | ICD-10-CM | POA: Diagnosis not present

## 2023-10-25 DIAGNOSIS — F330 Major depressive disorder, recurrent, mild: Secondary | ICD-10-CM

## 2023-10-25 DIAGNOSIS — G4700 Insomnia, unspecified: Secondary | ICD-10-CM | POA: Diagnosis not present

## 2023-10-25 MED ORDER — TRAZODONE HCL 50 MG PO TABS
25.0000 mg | ORAL_TABLET | Freq: Every evening | ORAL | 1 refills | Status: DC | PRN
  Filled 2023-10-25: qty 45, 90d supply, fill #0
  Filled 2024-03-25: qty 45, 90d supply, fill #1

## 2023-10-25 NOTE — Patient Instructions (Signed)
 Continue sertraline 150 mg at night Continue bupropion 300 mg daily  Continue trazodone 25 mg at night as needed for insomnia Next appointment: 5/15 at 3:30

## 2023-10-26 ENCOUNTER — Other Ambulatory Visit (HOSPITAL_COMMUNITY): Payer: Self-pay

## 2023-10-26 ENCOUNTER — Other Ambulatory Visit: Payer: Self-pay

## 2023-10-26 ENCOUNTER — Encounter: Payer: Self-pay | Admitting: Family Medicine

## 2023-10-26 ENCOUNTER — Ambulatory Visit: Payer: Medicare HMO | Admitting: Family Medicine

## 2023-10-26 VITALS — BP 126/66 | HR 65 | Ht 66.0 in | Wt 153.0 lb

## 2023-10-26 DIAGNOSIS — R413 Other amnesia: Secondary | ICD-10-CM

## 2023-10-26 DIAGNOSIS — R296 Repeated falls: Secondary | ICD-10-CM | POA: Diagnosis not present

## 2023-10-26 DIAGNOSIS — M792 Neuralgia and neuritis, unspecified: Secondary | ICD-10-CM

## 2023-10-26 DIAGNOSIS — I10 Essential (primary) hypertension: Secondary | ICD-10-CM | POA: Diagnosis not present

## 2023-10-26 DIAGNOSIS — F330 Major depressive disorder, recurrent, mild: Secondary | ICD-10-CM | POA: Diagnosis not present

## 2023-10-26 DIAGNOSIS — Z8673 Personal history of transient ischemic attack (TIA), and cerebral infarction without residual deficits: Secondary | ICD-10-CM

## 2023-10-26 DIAGNOSIS — L853 Xerosis cutis: Secondary | ICD-10-CM

## 2023-10-26 DIAGNOSIS — E118 Type 2 diabetes mellitus with unspecified complications: Secondary | ICD-10-CM

## 2023-10-26 MED ORDER — DONEPEZIL HCL 5 MG PO TABS
5.0000 mg | ORAL_TABLET | Freq: Every day | ORAL | 1 refills | Status: DC
Start: 2023-10-26 — End: 2024-04-25
  Filled 2023-10-26: qty 90, 90d supply, fill #0
  Filled 2024-03-25: qty 90, 90d supply, fill #1

## 2023-10-26 NOTE — Progress Notes (Signed)
 Established patient visit   Patient: Leah Schaefer   DOB: Jul 21, 1941   83 y.o. Female  MRN: [id number] Visit Date: 10/26/2023  Today's healthcare provider: [name], MD   Chief Complaint  Patient presents with   Diabetes    Keeps log at home, sugars steadily normal, no concerns   Hypertension    Keeps log at home, no concerns   Depression    Pt states she's not depressed, daughter thinks she thinks she may be   Subjective     HPI     Diabetes    Additional comments: Keeps log at home, sugars steadily normal, no concerns        Hypertension    Additional comments: Keeps log at home, no concerns        Depression    Additional comments: Pt states she's not depressed, daughter thinks she thinks she may be      Last edited by Allayne Stack on 10/26/2023  1:14 PM.       Discussed the use of AI scribe software for clinical note transcription with the patient, who gave verbal consent to proceed.  History of Present Illness Leah Schaefer is an 83 year old female with depression, diabetes, and hypertension who presents for follow-up of her chronic conditions. She is accompanied by her daughter, who is her primary caregiver.  She requires a refill for Aricept 5 mg, which she takes for memory changes. She experiences ongoing issues with skin picking, described by her daughter as a constant behavior from morning until night. Her daughter notes that she picks at her face and pulls her skin, sometimes resulting in sores, although none are currently visible. Attempts to implement a skincare routine have been made to address potential dryness, but adherence is inconsistent.  Regarding her depression, she is currently seeing a behavioral health specialist and reports that the sessions are going well. She is on Wellbutrin 300 mg daily and Zoloft 150 mg daily, which she takes as one and a half tablets at night. Her mood is stable, and she is satisfied with her  current behavioral health provider.  For her diabetes, she takes metformin 500 mg twice a day, having reduced the dose from 1000 mg due to gastrointestinal side effects. Her blood sugar readings are reportedly normal, and her A1c is below 7%.  Her hypertension is managed with losartan 50 mg and Lopressor (metoprolol) 12.5 mg twice a day. Her blood pressure is reported as well-controlled, with a recent reading of 126/66 mmHg.  She also takes atorvastatin 40 mg for cholesterol, Plavix 75 mg, gabapentin 100 mg twice a day, and various vitamins including B12 500 mg, vitamin D, calcium, vitamin E, and vitamin C. She uses Flonase as needed and has Zofran available for nausea, though it is rarely needed.  Her daughter reports concerns about her mobility, noting that she tends to 'plop' down into chairs without control, which has led to falls. She continues to exercise twice a day and walks through the house, but her daughter emphasizes the need for more controlled movements to prevent injury.     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 Chemotherapy/post wide excision whole breast radiation.   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     atorvastatin (LIPITOR) 40 MG tablet Take 1 tablet (40 mg total) by mouth every evening.    buPROPion (WELLBUTRIN XL) 300 MG 24 hr tablet Take 1 tablet (300 mg total) by mouth daily.    Calcium Carb-Cholecalciferol 600-800 MG-UNIT TABS Take 1 tablet by mouth daily.    cholecalciferol (VITAMIN D) 1000 units tablet Take 1,000 Units by mouth daily.    clopidogrel (PLAVIX) 75 MG tablet Take 1 tablet (75 mg total) by mouth daily.    cyanocobalamin (VITAMIN B12) 500 MCG tablet Take 1 tablet (500 mcg total) by mouth daily.    fluticasone (FLONASE) 50 MCG/ACT nasal spray Place 2 sprays into both nostrils daily.    gabapentin (NEURONTIN) 100 MG capsule Take 1 capsule (100 mg total) by mouth 2 (two) times daily.    glucose blood (ACCU-CHEK AVIVA PLUS) test strip Use as instructed    Lancets (ONETOUCH DELICA PLUS LANCET33G) MISC Use as instructed    latanoprost (XALATAN) 0.005 % ophthalmic solution Place 1 drop into both eyes at bedtime.     losartan (COZAAR) 50 MG tablet Take 1 tablet (50 mg total) by mouth daily.    metFORMIN (GLUCOPHAGE) 1000 MG tablet TAKE 1 TABLET TWICE DAILY WITH MEALS (Patient taking differently: Take 500 mg by mouth 2 (two) times daily with a meal. TAKE 1/2 TABLET TWICE DAILY WITH MEALS)    metoprolol tartrate (LOPRESSOR) 25 MG tablet Take 0.5 tablets (12.5 mg total) by mouth 2 (two) times daily.    Multiple Vitamin (MULTIVITAMIN WITH  MINERALS) TABS tablet Take 1 tablet by mouth daily.    ondansetron (ZOFRAN-ODT) 8 MG disintegrating tablet Take by mouth as needed.    pantoprazole (PROTONIX) 40 MG tablet Take 1 tablet (40 mg total) by mouth 2 (two) times daily.    pyridoxine (B-6) 100 MG tablet Take 100 mg by mouth daily.    sertraline (ZOLOFT) 100 MG tablet Take 1.5 tablets (150 mg total) by mouth daily.    [START ON 11/16/2023] traZODone (DESYREL) 50 MG tablet Take 0.5 tablets (25 mg total) by mouth at bedtime as needed for sleep.    vitamin C (ASCORBIC ACID) 500 MG tablet Take 500 mg  by mouth daily.    vitamin E 400 UNIT capsule Take 400 Units by mouth daily.    [DISCONTINUED] donepezil (ARICEPT) 5 MG tablet Take 1 tablet (5 mg total) by mouth at bedtime. 10/26/2023: NEEDS REFILL   [DISCONTINUED] ondansetron (ZOFRAN) 4 MG tablet Take 1 tablet (4 mg total) by mouth every 8 (eight) hours as needed for nausea or vomiting.    No facility-administered medications prior to visit.    Review of Systems  Last metabolic panel Lab Results  Component Value Date   GLUCOSE 98 07/20/2023   NA 143 07/20/2023   K 4.5 07/20/2023   CL 104 07/20/2023   CO2 23 07/20/2023   BUN 12 07/20/2023   CREATININE 0.94 07/20/2023   EGFR 61 07/20/2023   CALCIUM 10.0 07/20/2023   PROT 7.0 07/20/2023   ALBUMIN 4.5 07/20/2023   LABGLOB 2.5 07/20/2023   AGRATIO 2.0 08/23/2022   BILITOT 0.3 07/20/2023   ALKPHOS 70 07/20/2023   AST 19 07/20/2023   ALT 12 07/20/2023   ANIONGAP 8 02/02/2023   Last lipids Lab Results  Component Value Date   CHOL 110 10/14/2021   HDL 33 (L) 10/14/2021   LDLCALC 51 10/14/2021   TRIG 151 (H) 10/14/2021   CHOLHDL 3.3 10/14/2021   Last hemoglobin A1c Lab Results  Component Value Date   HGBA1C 6.5 (H) 07/20/2023   Last thyroid functions Lab Results  Component Value Date   TSH 2.583 06/25/2023    No results found for: "VITAMINB12"     Objective    BP 126/66   Pulse 65   Ht 5\' 6"  (1.676 m)   Wt 153  lb (69.4 kg)   SpO2 100%   BMI 24.69 kg/m  BP Readings from Last 3 Encounters:  10/26/23 126/66  07/20/23 (!) 145/77  07/18/23 124/83   Wt Readings from Last 3 Encounters:  10/26/23 153 lb (69.4 kg)  07/20/23 148 lb 9.6 oz (67.4 kg)  07/18/23 153 lb (69.4 kg)        Physical Exam  Physical Exam VITALS: BP- 126/66 MEASUREMENTS: Weight- 150. CARDIOVASCULAR: Regular rate and rhythm, normal heart sounds. NEUROLOGICAL: Sensation intact bilaterally. SKIN: Normal skin appearance without rashes    No results found for any visits on 10/26/23.  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 Behavioral health specialist involvement is beneficial. - Refill Aricept 5 mg daily  Skin picking Chronic Habitual skin picking poses a risk for skin damage and infection. Current skin condition  is good. Redirecting energy to activities like using a squeeze ball or puzzles is recommended. Simplifying skincare routine may prevent dryness and irritation. - Simplify skincare routine with water, a rag, and moisturizer - Consider using Vaseline to seal in moisture and prevent picking  Depression Depression  managed with Wellbutrin 300mg  dailyand Zoloft 150mg  daily . Mood is stable with ongoing behavioral health specialist support. -continue current regimen   Diabetes Mellitus Type 2 Chronic  Diabetes is well-managed with A1c below 7. Metformin dosage adjusted to 500 mg twice daily due to gastrointestinal side effects. - Continue metformin 500 mg twice daily  Hypertension Chronic Hypertension is well-controlled at 126/66 mmHg. - continue losartan 50mg  daily  and metoprolol 25mg  daily   General Health Maintenance Due for a wellness visit. Maintaining mobility to prevent falls is crucial. Encouraged to exercise consistently and increase outdoor activities to enhance well-being. - Schedule wellness visit - Encourage consistent exercise and mobility to prevent falls  Follow-up Follow-up with behavioral health specialist scheduled for May. DME order for a transport chair will aid mobility and outings. - Follow up with behavioral health specialist in May - Complete DME order for transport chair     Return in about 6 months (around 04/27/2024) for CHRONIC F/U.         [name], MD  [facility] [phone] (phone) [phone] (fax)  [facility]

## 2023-11-29 DIAGNOSIS — F32A Depression, unspecified: Secondary | ICD-10-CM | POA: Diagnosis not present

## 2023-11-29 DIAGNOSIS — I69354 Hemiplegia and hemiparesis following cerebral infarction affecting left non-dominant side: Secondary | ICD-10-CM | POA: Diagnosis not present

## 2023-11-29 DIAGNOSIS — F0280 Dementia in other diseases classified elsewhere without behavioral disturbance: Secondary | ICD-10-CM | POA: Diagnosis not present

## 2023-11-29 DIAGNOSIS — G309 Alzheimer's disease, unspecified: Secondary | ICD-10-CM | POA: Diagnosis not present

## 2023-11-29 DIAGNOSIS — G5793 Unspecified mononeuropathy of bilateral lower limbs: Secondary | ICD-10-CM | POA: Diagnosis not present

## 2023-11-29 DIAGNOSIS — F0150 Vascular dementia without behavioral disturbance: Secondary | ICD-10-CM | POA: Diagnosis not present

## 2023-11-29 DIAGNOSIS — G939 Disorder of brain, unspecified: Secondary | ICD-10-CM | POA: Diagnosis not present

## 2023-11-29 DIAGNOSIS — R296 Repeated falls: Secondary | ICD-10-CM | POA: Diagnosis not present

## 2023-12-15 NOTE — Progress Notes (Unsigned)
 [facility] MD/PA/NP OP Progress Note  12/20/2023 4:12 PM Leah Schaefer  MRN:  [id number]  Chief Complaint:  Chief Complaint  Patient presents with   Follow-up   HPI:  According to the chart review, the following events have occurred since the last visit: The patient was seen by Dr. Mason Sole 11/2023 1. 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 She is still able to complete activities of daily living. Consideration of reducing polypharmacy by trialing off Donepezil , as there is no significant change in memory function. Emphasis on non-pharmacological interventions and social interaction to manage symptoms. Discussion about the potential benefit of Seroquel for skin picking behavior, but decision to consult with psychiatrist before starting any new medication. - Trial off Donepezil  for two weeks and monitor for changes in memory function - Consult with psychiatrist regarding potential use of Seroquel for skin picking behavior   This is a follow-up appointment for depression, irritability disorder. She presents to the visit with her daughter, Leah Schaefer. She states that she has been feeling better, having strength.  However, she tends to stay in the sofa. She may go to [facility].  She states that she goes to swimming class (according to her daughter, she has not been there for many years).  When she is asked the reason of not doing so, she states that it is hard for others to take her to places.  Her daughter intervene that she stopped doing thing. Although she was given wheelchair, she does not keep the appointment.  She refuses to do anything including exercise.  She is not walking as she is supposed to. Leah Schaefer is also concerned that she digs into her skin with a pin, thinking that there are insects and bugs. It has worsened in the past month. She  did have this about an year ago, although no other similar episode. Leah Schaefer sprayed places, although she has not seen anything. Leah Schaefer is very frustrated with this. Leah Schaefer continues to touching her face, including her skin behind her ear. When she was asked about this touching her face, she looked into her arm, stating that "I never had it before." She states that "(bugs are) coming out now"   She has insomnia,.  She has some numbness during the day, although she does not take naps so long. Leah Schaefer reminds her of eating meals, as she appears to be forgetting to eat. No SI, HI. No wandering behavior.     Wt Readings from Last 3 Encounters:  12/20/23 142 lb 3.2 oz (64.5 kg)  10/26/23 153 lb (69.4 kg)  10/25/23 148 lb 12.8 oz (67.5 kg)      Support: Household: daughter Marital status:widow (34 years of marriage. 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 It aws "alright", good support   Substance use   Tobacco Alcohol  Other substances/  Current   denies denies  Past     denies  Past Treatment           Visit Diagnosis:    ICD-10-CM   1. MDD (major depressive disorder), recurrent episode, mild (HCC)  F33.0     2. Mild neurocognitive disorder  G31.84     3. Insomnia, unspecified type  G47.00     4.  Hallucinations  R44.3       Past Psychiatric History: Please see initial evaluation for full details. I have reviewed the history. No updates at this time.     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 Chemotherapy/post wide excision whole breast radiation.   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    Personal history of chemotherapy 2013   LEFT BREAST   Personal history of malignant neoplasm of breast    Personal history of radiation therapy 2017    Rt, 2013 had on left   Stroke (HCC) 09/06/2020   1 cm acute infarction in the right parietal deep and subcortical white matter   Vertigo     Past Surgical History:  Procedure Laterality Date   BREAST BIOPSY Left 07/18/2011   +   BREAST BIOPSY Left 09/21/2015   neg   BREAST BIOPSY Left 04/05/2016   neg   BREAST BIOPSY Left 03/20/2019   us  bx 2 areas /fat necrosis at 9:30 ribbon and 5:30 coil   BREAST EXCISIONAL BIOPSY Right 08/2015   [facility]   BREAST LUMPECTOMY Right 09/30/2015   [facility] WITH MICROPAPILLARY FEATURES AND DCIS/CLEAR MARGINS, NEGATIVE LN   BREAST LUMPECTOMY Left 08/17/2011   BREAST LUMPECTOMY WITH SENTINEL LYMPH NODE BIOPSY Right 09/30/2015   Procedure: BREAST LUMPECTOMY WITH SENTINEL LYMPH NODE BX;  Surgeon: Marshall Skeeter, MD;  Location: [facility] ORS;  Service: General;  Laterality: Right;   BREAST SURGERY Left 2012   wide excision   BREAST SURGERY Left November 2013   Core biopsy of the upper-outer quadrant showed fat necrosis.   CHOLECYSTECTOMY  2007   COLONOSCOPY  2011   Dr Janine Melbourne   COLONOSCOPY WITH PROPOFOL  N/A 02/25/2015   Procedure: COLONOSCOPY WITH PROPOFOL ;  Surgeon: Stephens Eis, MD;  Location: [facility]  ENDOSCOPY;  Service: Gastroenterology;  Laterality: N/A;   CYSTOSCOPY WITH STENT PLACEMENT Left 11/05/2020   Procedure: CYSTOSCOPY WITH STENT PLACEMENT;  Surgeon: Geraline Knapp, MD;  Location: [facility] ORS;  Service: Urology;  Laterality: Left;   CYSTOSCOPY/URETEROSCOPY/HOLMIUM LASER/STENT PLACEMENT Left 12/07/2020   Procedure: CYSTOSCOPY/URETEROSCOPY/HOLMIUM LASER/STENT PLACEMENT;  Surgeon: Geraline Knapp, MD;  Location: [facility] ORS;  Service: Urology;  Laterality: Left;   DILATION AND CURETTAGE OF UTERUS  2004   HERNIA REPAIR Right 09/10/2007   Umbilical/ventral hernia repaired with 6.4 cm Proceed ventral patch   PORT-A-CATH REMOVAL     PORTACATH PLACEMENT  2013   RECURRENT HERNIA N/A 01/07/2008   Recurrent ventral hernia at the umbilicus, laparoscopy, open  placement of a large Ultra Pro mesh with trans-fascial sutures.   UPPER GI ENDOSCOPY  2011    Family Psychiatric History: Please see initial evaluation for full details. I have reviewed the history. No updates at this time.     Family History:  Family History  Problem Relation Age of Onset   Lymphoma Father    Ovarian cancer Sister    Colon cancer Brother    Lymphoma Brother    Cancer Other        stomach   Cancer Other        stomach   Breast cancer Neg Hx     Social History:  Social History   Socioeconomic History   Marital status: Widowed    Spouse name: Not on file   Number of children: 2   Years of education: H/S   Highest education level: 12th grade  Occupational History   Occupation: Retired  Tobacco Use   Smoking status: Never   Smokeless tobacco: Never  Vaping Use   Vaping status: Never Used  Substance and Sexual Activity   Alcohol  use: No   Drug use: No   Sexual activity: Not Currently  Other Topics Concern   Not on file  Social History Narrative   Lives alone   Social Drivers of Health   Financial Resource Strain: Low Risk  (10/25/2021)   Overall Financial Resource Strain (CARDIA)    Difficulty of  Paying Living Expenses: Not hard at all  Food Insecurity: No Food Insecurity (04/12/2023)   Hunger Vital Sign    Worried About Running Out of Food in the Last Year: Never true    Ran Out of Food in the Last Year: Never true  Transportation Needs: No Transportation Needs (04/12/2023)   PRAPARE - Administrator, Civil Service (Medical): No    Lack of Transportation (Non-Medical): No  Physical Activity: Inactive (10/30/2022)   Exercise Vital Sign    Days of Exercise per Week: 0 days    Minutes of Exercise per Session: 0 min  Stress: No Stress Concern Present (10/30/2022)   [employer] of Occupational Health - Occupational Stress Questionnaire    Feeling of Stress : Not at all  Social Connections: Moderately Isolated (10/30/2022)   Social Connection and Isolation Panel [NHANES]    Frequency of Communication with Friends and Family: More than three times a week    Frequency of Social Gatherings with Friends and Family: More than three times a week    Attends Religious Services: 1 to 4 times per year    Active Member of [employer] or Organizations: No    Attends Banker Meetings: Never    Marital Status: Widowed    Allergies: No Known Allergies  Metabolic Disorder Labs: Lab Results  Component Value Date   HGBA1C 6.5 (H) 07/20/2023   MPG 171.42 09/07/2020   MPG 157.07 11/07/2018   No results found for: "PROLACTIN" Lab Results  Component Value Date   CHOL 110 10/14/2021   TRIG 151 (H) 10/14/2021   HDL 33 (L) 10/14/2021   CHOLHDL 3.3 10/14/2021   VLDL 18 09/07/2020   LDLCALC 51 10/14/2021   LDLCALC 57 09/07/2020   Lab Results  Component Value Date   TSH 2.583 06/25/2023   TSH 2.110 10/14/2021    Therapeutic Level Labs: No results found for: "LITHIUM" No results found for: "VALPROATE" No results found for: "CBMZ"  Current Medications: Current Outpatient Medications  Medication Sig Dispense Refill   acetaminophen  (  TYLENOL ) 500 MG tablet Take 500 mg  by mouth every 6 (six) hours as needed for mild pain or headache.      atorvastatin  (LIPITOR) 40 MG tablet Take 1 tablet (40 mg total) by mouth every evening. 90 tablet 3   buPROPion  (WELLBUTRIN  XL) 300 MG 24 hr tablet Take 1 tablet (300 mg total) by mouth daily. 90 tablet 1   Calcium  Carb-Cholecalciferol  600-800 MG-UNIT TABS Take 1 tablet by mouth daily.     cholecalciferol  (VITAMIN D ) 1000 units tablet Take 1,000 Units by mouth daily.     clopidogrel  (PLAVIX ) 75 MG tablet Take 1 tablet (75 mg total) by mouth daily. 90 tablet 2   cyanocobalamin  (VITAMIN B12) 500 MCG tablet Take 1 tablet (500 mcg total) by mouth daily. 30 tablet 3   fluticasone  (FLONASE ) 50 MCG/ACT nasal spray Place 2 sprays into both nostrils daily. 16 g 6   gabapentin  (NEURONTIN ) 100 MG capsule Take 1 capsule (100 mg total) by mouth 2 (two) times daily. 60 capsule 1   glucose blood (ONETOUCH VERIO) test strip Use as instructed to test blood sugar twice daily 100 strip 0   Lancets (ONETOUCH DELICA PLUS LANCET33G) MISC Use as instructed to test blood sugar twice daily 100 each 0   latanoprost  (XALATAN ) 0.005 % ophthalmic solution Place 1 drop into both eyes at bedtime.      losartan  (COZAAR ) 50 MG tablet Take 1 tablet (50 mg total) by mouth daily. 90 tablet 3   metFORMIN  (GLUCOPHAGE ) 1000 MG tablet TAKE 1 TABLET TWICE DAILY WITH MEALS (Patient taking differently: Take 500 mg by mouth 2 (two) times daily with a meal. TAKE 1/2 TABLET TWICE DAILY WITH MEALS) 180 tablet 3   metoprolol  tartrate (LOPRESSOR ) 25 MG tablet Take 0.5 tablets (12.5 mg total) by mouth 2 (two) times daily. 90 tablet 2   Multiple Vitamin (MULTIVITAMIN WITH MINERALS) TABS tablet Take 1 tablet by mouth daily.     ondansetron  (ZOFRAN -ODT) 8 MG disintegrating tablet Take by mouth as needed.     pantoprazole  (PROTONIX ) 40 MG tablet Take 1 tablet (40 mg total) by mouth 2 (two) times daily. 180 tablet 3   pyridoxine  (B-6) 100 MG tablet Take 100 mg by mouth daily.      QUEtiapine (SEROQUEL) 25 MG tablet Take 1 tablet (25 mg total) by mouth at bedtime. 30 tablet 1   sertraline  (ZOLOFT ) 100 MG tablet Take 1.5 tablets (150 mg total) by mouth daily. 135 tablet 1   traZODone  (DESYREL ) 50 MG tablet Take 0.5 tablets (25 mg total) by mouth at bedtime as needed for sleep. 45 tablet 1   vitamin C (ASCORBIC ACID ) 500 MG tablet Take 500 mg by mouth daily.     vitamin E  400 UNIT capsule Take 400 Units by mouth daily.     donepezil  (ARICEPT ) 5 MG tablet Take 1 tablet (5 mg total) by mouth at bedtime. (Patient not taking: Reported on 12/20/2023) 90 tablet 1   No current facility-administered medications for this visit.     Musculoskeletal: Strength & Muscle Tone: within normal limits Gait & Station: uses a walker Patient leans: N/A  Psychiatric Specialty Exam: Review of Systems  Psychiatric/Behavioral:  Positive for hallucinations and sleep disturbance. Negative for agitation, behavioral problems, confusion, decreased concentration, dysphoric mood, self-injury and suicidal ideas. The patient is not nervous/anxious and is not hyperactive.   All other systems reviewed and are negative.   Blood pressure 118/75, pulse 78, temperature (!) 97.3 F (36.3 C), temperature source  Temporal, height 5\' 6"  (1.676 m), weight 142 lb 3.2 oz (64.5 kg).Body mass index is 22.95 kg/m.  General Appearance: Well Groomed skin- no apparent bugs, no bleeding  Eye Contact:  Good  Speech:  Clear and Coherent  Volume:  Normal  Mood:  better  Affect:  Appropriate, Non-Congruent, and Restricted  Thought Process:  Irrelevant  Orientation:  NA  Thought Content: Delusions   Suicidal Thoughts:  No  Homicidal Thoughts:  No  Memory:  Immediate;   Fair  Judgement:  Fair  Insight:  Shallow  Psychomotor Activity:  Normal  Concentration:  Concentration: Fair and Attention Span: Fair  Recall:  [employer] of Knowledge: Good  Language: Good  Akathisia:  No  Handed:  Right  AIMS (if indicated):  not done  Assets:  Social Support  ADL's:  Impaired  Cognition: Impaired,  Mild  Sleep:  Poor   Screenings: GAD-7    Flowsheet Row Office Visit from 10/26/2023 in [facility] Office Visit from 07/20/2023 in [facility] Office Visit from 06/08/2023 in [facility]  Total GAD-7 Score 5 9 7       PHQ2-9    Flowsheet Row Office Visit from 10/26/2023 in [facility] Office Visit from 07/20/2023 in [facility] Office Visit from 06/08/2023 in [facility] Office Visit from 02/02/2023 in [facility] Office Visit from 12/08/2022 in [facility]  PHQ-2 Total Score 3 4 6 2 6   PHQ-9 Total Score 8 16 17 7 13       Flowsheet Row ED from 12/15/2022 in [facility] Emergency Department at [facility] ED from 11/28/2022 in [facility] Emergency Department at [facility] ED from 09/23/2022 in [facility] Emergency Department at [facility]  C-SSRS RISK CATEGORY No Risk No Risk No Risk        Assessment and Plan:  Leah Schaefer is a 83 y.o. year old female with a history of depression, mixed dementia, frequent fall, history of 1 cm acute infarction in the right parietal deep and subcortical white matter causing left leg motor planning deficit, SVD, neuropathy, h/o breast cancer s/p chemotherapy, radiation, who is referred for depression.   1. MDD (major depressive disorder), recurrent episode, mild (HCC) Acute stressors include:  Other stressors include:  loss of her husband in 2020, s/p stroke. chemotherapy History: reportedly has a history of not telling the truth to provider, either to minimize, or  falsely complaining symptoms Although she reports overall improvement in her mood, it is difficult to assess due to her cognitive impairment.  According to the caregiver, she is not  active during the day, although there used to be an improvement in anhedonia, hypersomnia since uptitration of sertraline .  Will continue current dose of sertraline  along with bupropion  as quetiapine is started as outlined below.   2. Mild neurocognitive disorder Functional Status   IADL: Independent in the following:            Requires assistance with the following: managing finances, medications, driving ADL  Independent in the following: bathing and hygiene, continence, toileting, walking (walker)          Requires assistance with the following: grooming, feeding,  Folate, Vitamin B12, TSH wnl 06/2023 [facility] Neuropsych assessment:  Etiology: vascular   NPS- VH, sundowning- improving - hold on donepezil , seen by Dr. Mason Sole Exam is notable for irrelevant thought  process, ruminating on her skin/VH/delusion of bugs crawling out . There has been significant worsening in hallucination, insomnia, which is likely secondary to a neurocognitive disorder.  Will start quetiapine as outlined below.   4. Hallucinations # r/o delusional infestations Newly addressed, and is significantly worsening.  She has ego syntonic VH of bug, crawling her skins, and dig into her skin due to this.  Noted that she also has skin picking of her face.  Will start quetiapine to target this.  Discussed potential metabolic side effect, QTc prolongation, EPS.  Will consider obtaining EKG if she were to remain on this medication.   3. Insomnia, unspecified type Worsening in the past few months, although she had a good benefit from trazodone  until recently.  The caregiver was advised to first quetiapine to avoid the drowsiness, although she can continue trazodone  as needed for insomnia.    Plan Continue sertraline  150 mg at night Continue bupropion  300 mg daily  Start quetiapine 25 mg at night - [facility] Continue trazodone  25 mg at night as needed for insomnia Next appointment: 7/3 at 3:30, IP   The patient demonstrates the  following risk factors for suicide: Chronic risk factors for suicide include: psychiatric disorder of depression . Acute risk factors for suicide include: unemployment and loss (financial, interpersonal, professional). Protective factors for this patient include: positive social support and hope for the future. Considering these factors, the overall suicide risk at this point appears to be low. Patient is appropriate for outpatient follow up.   This visit involved a longitudinal and complex condition requiring extended medical decision-making, coordination of care, and management beyond what is typically captured in CPT 99214. The complexity of the patient's condition justifies the use of G2211.     Collaboration of Care: Collaboration of Care: Other reviewed notes in epic  Patient/Guardian was advised Release of Information must be obtained prior to any record release in order to collaborate their care with an outside provider. Patient/Guardian was advised if they have not already done so to contact the registration department to sign all necessary forms in order for us  to release information regarding their care.   Consent: Patient/Guardian gives verbal consent for treatment and assignment of benefits for services provided during this visit. Patient/Guardian expressed understanding and agreed to proceed.    Todd Fossa, MD 12/20/2023, 4:12 PM

## 2023-12-20 ENCOUNTER — Encounter: Payer: Self-pay | Admitting: Psychiatry

## 2023-12-20 ENCOUNTER — Ambulatory Visit (INDEPENDENT_AMBULATORY_CARE_PROVIDER_SITE_OTHER): Admitting: Psychiatry

## 2023-12-20 ENCOUNTER — Other Ambulatory Visit: Payer: Self-pay | Admitting: Family Medicine

## 2023-12-20 ENCOUNTER — Other Ambulatory Visit (HOSPITAL_COMMUNITY): Payer: Self-pay

## 2023-12-20 ENCOUNTER — Other Ambulatory Visit: Payer: Self-pay

## 2023-12-20 VITALS — BP 118/75 | HR 78 | Temp 97.3°F | Ht 66.0 in | Wt 142.2 lb

## 2023-12-20 DIAGNOSIS — R443 Hallucinations, unspecified: Secondary | ICD-10-CM

## 2023-12-20 DIAGNOSIS — G3184 Mild cognitive impairment, so stated: Secondary | ICD-10-CM | POA: Diagnosis not present

## 2023-12-20 DIAGNOSIS — F330 Major depressive disorder, recurrent, mild: Secondary | ICD-10-CM

## 2023-12-20 DIAGNOSIS — G4700 Insomnia, unspecified: Secondary | ICD-10-CM | POA: Diagnosis not present

## 2023-12-20 MED ORDER — ONETOUCH VERIO VI STRP
ORAL_STRIP | 0 refills | Status: AC
  Filled 2023-12-20: qty 100, 50d supply, fill #0

## 2023-12-20 MED ORDER — ONETOUCH DELICA PLUS LANCET33G MISC
0 refills | Status: AC
  Filled 2023-12-20: qty 100, 50d supply, fill #0

## 2023-12-20 MED ORDER — QUETIAPINE FUMARATE 25 MG PO TABS: 25.0000 mg | ORAL_TABLET | Freq: Every day | ORAL | 1 refills | Status: DC

## 2023-12-20 NOTE — Patient Instructions (Signed)
 Continue sertraline  150 mg at night Continue bupropion  300 mg daily  Start quetiapine 25 mg at night  Continue trazodone  25 mg at night as needed for insomnia Next appointment: 7/3 at 3:30,

## 2023-12-24 ENCOUNTER — Telehealth: Payer: Self-pay

## 2023-12-24 NOTE — Telephone Encounter (Signed)
 pt daughter called states that her mother is not taking the seroquel . states that they read all the side affects and that they dont want to take this medication. medication can cause cataract and she already has and don't want to make it worse. she states can cause heart attacks and she already has blood pressure and cholesterol issues.     Pt was last seen on 5-15 next appt 7-3

## 2023-12-24 NOTE — Telephone Encounter (Signed)
 pt daughter was given the information. she states that she is at work right now but that she wants to look up the medication 1st and that she will call later today or tomorrow.

## 2023-12-24 NOTE — Telephone Encounter (Signed)
 If she is interested in trying Abilify-which is in the same medication class as quetiapine -we can consider switching to it. However, it may be less effective for treating insomnia. Abilify has a similar side effect profile but tends to carry lower risk for cardiac issues and metabolic/cholesterol abnormalities, and to my knowledge, there is less concern about cataract risk compared to quetiapine .

## 2023-12-25 ENCOUNTER — Other Ambulatory Visit: Payer: Self-pay | Admitting: Psychiatry

## 2023-12-25 MED ORDER — ARIPIPRAZOLE 2 MG PO TABS: 2.0000 mg | ORAL_TABLET | Freq: Every day | ORAL | 1 refills | Status: DC

## 2023-12-25 NOTE — Telephone Encounter (Signed)
 pt daughter called back she states that she has reviewed and they wanted to go ahead and have her try the abilify. can you send to the [facility] [place] [place]

## 2023-12-25 NOTE — Telephone Encounter (Signed)
 pt daughter notified that rx was sent to the pharmacy

## 2023-12-25 NOTE — Telephone Encounter (Signed)
 Abilify 2 mg was ordered.

## 2024-01-23 ENCOUNTER — Other Ambulatory Visit: Payer: Self-pay | Admitting: Internal Medicine

## 2024-01-23 ENCOUNTER — Inpatient Hospital Stay: Payer: Self-pay | Attending: Internal Medicine

## 2024-01-23 ENCOUNTER — Inpatient Hospital Stay (HOSPITAL_BASED_OUTPATIENT_CLINIC_OR_DEPARTMENT_OTHER): Payer: Self-pay | Admitting: Internal Medicine

## 2024-01-23 ENCOUNTER — Encounter: Payer: Self-pay | Admitting: Internal Medicine

## 2024-01-23 VITALS — BP 141/78 | HR 62 | Temp 97.1°F | Resp 16 | Ht 66.0 in | Wt 148.2 lb

## 2024-01-23 DIAGNOSIS — Z170 Estrogen receptor positive status [ER+]: Secondary | ICD-10-CM

## 2024-01-23 DIAGNOSIS — M549 Dorsalgia, unspecified: Secondary | ICD-10-CM | POA: Diagnosis not present

## 2024-01-23 DIAGNOSIS — Z7902 Long term (current) use of antithrombotics/antiplatelets: Secondary | ICD-10-CM | POA: Diagnosis not present

## 2024-01-23 DIAGNOSIS — Z8041 Family history of malignant neoplasm of ovary: Secondary | ICD-10-CM | POA: Diagnosis not present

## 2024-01-23 DIAGNOSIS — Z807 Family history of other malignant neoplasms of lymphoid, hematopoietic and related tissues: Secondary | ICD-10-CM | POA: Diagnosis not present

## 2024-01-23 DIAGNOSIS — Z853 Personal history of malignant neoplasm of breast: Secondary | ICD-10-CM | POA: Diagnosis not present

## 2024-01-23 DIAGNOSIS — Z7901 Long term (current) use of anticoagulants: Secondary | ICD-10-CM | POA: Diagnosis not present

## 2024-01-23 DIAGNOSIS — Z79899 Other long term (current) drug therapy: Secondary | ICD-10-CM | POA: Diagnosis not present

## 2024-01-23 DIAGNOSIS — F0154 Vascular dementia, unspecified severity, with anxiety: Secondary | ICD-10-CM | POA: Insufficient documentation

## 2024-01-23 DIAGNOSIS — E119 Type 2 diabetes mellitus without complications: Secondary | ICD-10-CM | POA: Insufficient documentation

## 2024-01-23 DIAGNOSIS — I10 Essential (primary) hypertension: Secondary | ICD-10-CM | POA: Insufficient documentation

## 2024-01-23 DIAGNOSIS — G8929 Other chronic pain: Secondary | ICD-10-CM | POA: Diagnosis not present

## 2024-01-23 DIAGNOSIS — Z800 Family history of malignant neoplasm of digestive organs: Secondary | ICD-10-CM | POA: Insufficient documentation

## 2024-01-23 DIAGNOSIS — N6320 Unspecified lump in the left breast, unspecified quadrant: Secondary | ICD-10-CM

## 2024-01-23 DIAGNOSIS — Z87442 Personal history of urinary calculi: Secondary | ICD-10-CM | POA: Diagnosis not present

## 2024-01-23 DIAGNOSIS — C50411 Malignant neoplasm of upper-outer quadrant of right female breast: Secondary | ICD-10-CM

## 2024-01-23 DIAGNOSIS — M2550 Pain in unspecified joint: Secondary | ICD-10-CM | POA: Insufficient documentation

## 2024-01-23 DIAGNOSIS — Z9049 Acquired absence of other specified parts of digestive tract: Secondary | ICD-10-CM | POA: Diagnosis not present

## 2024-01-23 DIAGNOSIS — Z9221 Personal history of antineoplastic chemotherapy: Secondary | ICD-10-CM | POA: Insufficient documentation

## 2024-01-23 LAB — CBC WITH DIFFERENTIAL (CANCER CENTER ONLY)
Abs Immature Granulocytes: 0.01 10*3/uL (ref 0.00–0.07)
Basophils Absolute: 0 10*3/uL (ref 0.0–0.1)
Basophils Relative: 1 %
Eosinophils Absolute: 0.1 10*3/uL (ref 0.0–0.5)
Eosinophils Relative: 2 %
HCT: 40.8 % (ref 36.0–46.0)
Hemoglobin: 13.4 g/dL (ref 12.0–15.0)
Immature Granulocytes: 0 %
Lymphocytes Relative: 31 %
Lymphs Abs: 1.4 10*3/uL (ref 0.7–4.0)
MCH: 29.6 pg (ref 26.0–34.0)
MCHC: 32.8 g/dL (ref 30.0–36.0)
MCV: 90.3 fL (ref 80.0–100.0)
Monocytes Absolute: 0.4 10*3/uL (ref 0.1–1.0)
Monocytes Relative: 8 %
Neutro Abs: 2.6 10*3/uL (ref 1.7–7.7)
Neutrophils Relative %: 58 %
Platelet Count: 191 10*3/uL (ref 150–400)
RBC: 4.52 MIL/uL (ref 3.87–5.11)
RDW: 12.9 % (ref 11.5–15.5)
WBC Count: 4.4 10*3/uL (ref 4.0–10.5)
nRBC: 0 % (ref 0.0–0.2)

## 2024-01-23 LAB — CMP (CANCER CENTER ONLY)
ALT: 18 U/L (ref 0–44)
AST: 21 U/L (ref 15–41)
Albumin: 4 g/dL (ref 3.5–5.0)
Alkaline Phosphatase: 54 U/L (ref 38–126)
Anion gap: 7 (ref 5–15)
BUN: 14 mg/dL (ref 8–23)
CO2: 29 mmol/L (ref 22–32)
Calcium: 9.5 mg/dL (ref 8.9–10.3)
Chloride: 104 mmol/L (ref 98–111)
Creatinine: 0.96 mg/dL (ref 0.44–1.00)
GFR, Estimated: 59 mL/min — ABNORMAL LOW (ref >60)
Glucose, Bld: 78 mg/dL (ref 70–99)
Potassium: 4.1 mmol/L (ref 3.5–5.1)
Sodium: 140 mmol/L (ref 135–145)
Total Bilirubin: 0.7 mg/dL (ref 0.0–1.2)
Total Protein: 7.3 g/dL (ref 6.5–8.1)

## 2024-01-23 NOTE — Progress Notes (Signed)
 [facility] OFFICE PROGRESS NOTE  Patient Care Team: Mimi Alt, MD as PCP - General (Family Medicine) Byrnett, Magali Schmitz, MD (General Surgery) Gwyn Leos, MD as Consulting Physician (Internal Medicine) Bary Boss., MD as Consulting Physician (Ophthalmology) Glenis Langdon, MD as Referring Physician (Radiation Oncology) Todd Fossa, MD as Consulting Physician (Psychiatry)   SUMMARY OF HEMATOLOGIC/ONCOLOGIC HISTORY:  Oncology History Overview Note  # FEB 2017- RIGHT BREAST  STAGE I [T1b N0 M0]. ER/PR ER positive 100% PR positive 95% HER 2 FISH negative;  INVASIVE MAMMARY CARCINOMA WITH MICROPAPILLARY FEATURES. S/p Lumpec [Dr.Byrnett] & RT; No Oncotype; Femara   # 2013- LEFT BREAST- STAGE II; ER/PR/ Her 2 NEG s/p Lumpec [Dr.Byrnett] & RT  # BMD- April  2017- wnl; genetic testing- declined [May 2017]  # Vertigo [uses rolling walker]   Carcinoma of upper-outer quadrant of right breast in female, estrogen receptor positive (HCC)    INTERVAL HISTORY: with daughter.   Ambulating independently.   A very pleasant 83 year-old African-American female patient with above history of breast cancer-history of stroke/rasper dementia   diagnosed in February 2017 is here for follow-up; patient currently OFF Femara  [since JAN 2023].  Unfortunately patient had recent fall/slid from the sofa no major injuries.   C/o lump at incision site lt breast, tender, noticed this morning.   Daughter states appetite is 90%, pt has vascular dementia and forgets to eat at times, drinks a glucerna every day. Patient has had no further strokes.  No further deficits.  Ambulating independently at this time.  Appetite is fair.  Chronic back pain not any worse.  Review of Systems  Constitutional:  Negative for chills, diaphoresis, fever, malaise/fatigue and weight loss.  HENT:  Negative for nosebleeds and sore throat.   Eyes:  Negative for double vision.  Respiratory:   Negative for cough, hemoptysis, sputum production, shortness of breath and wheezing.   Cardiovascular:  Negative for chest pain, palpitations, orthopnea and leg swelling.  Gastrointestinal:  Negative for abdominal pain, blood in stool, constipation, diarrhea, heartburn, melena, nausea and vomiting.  Genitourinary:  Negative for dysuria, frequency and urgency.  Musculoskeletal:  Positive for back pain and joint pain.  Skin: Negative.  Negative for itching and rash.  Neurological:  Negative for dizziness, tingling, focal weakness, weakness and headaches.  Endo/Heme/Allergies:  Does not bruise/bleed easily.  Psychiatric/Behavioral:  Negative for depression. The patient is not nervous/anxious and does not have insomnia.      PAST MEDICAL HISTORY :  Past Medical History:  Diagnosis Date   Anxiety    Aortic atherosclerosis (HCC)    Arthritis    Breast cancer (HCC) 2013   LEFT breast with chemo and rad tx   Breast cancer (HCC) 2017   right breast with rad tx   Breast cancer of upper-inner quadrant of right female breast (HCC) 09/21/2015   T1bN0  10mm; ER100%, PR 90%, HER-2/neu not overexpressed.   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 Chemotherapy/post wide excision whole  breast radiation.   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    Personal history of chemotherapy 2013   LEFT BREAST   Personal history of malignant neoplasm of breast    Personal history of radiation therapy 2017    Rt, 2013 had on left   Stroke (HCC)  09/06/2020   1 cm acute infarction in the right parietal deep and subcortical white matter   Vertigo     PAST SURGICAL HISTORY :   Past Surgical History:  Procedure Laterality Date   BREAST BIOPSY Left 07/18/2011   +   BREAST BIOPSY Left 09/21/2015   neg   BREAST BIOPSY Left 04/05/2016   neg   BREAST BIOPSY Left 03/20/2019   us  bx 2 areas /fat necrosis at 9:30 ribbon and 5:30 coil   BREAST EXCISIONAL BIOPSY Right 08/2015   [facility]   BREAST LUMPECTOMY Right 09/30/2015   [facility] WITH MICROPAPILLARY FEATURES AND DCIS/CLEAR MARGINS, NEGATIVE LN   BREAST LUMPECTOMY Left 08/17/2011   BREAST LUMPECTOMY WITH SENTINEL LYMPH NODE BIOPSY Right 09/30/2015   Procedure: BREAST LUMPECTOMY WITH SENTINEL LYMPH NODE BX;  Surgeon: Marshall Skeeter, MD;  Location: [facility] ORS;  Service: General;  Laterality: Right;   BREAST SURGERY Left 2012   wide excision   BREAST SURGERY Left November 2013   Core biopsy of the upper-outer quadrant showed fat necrosis.   CHOLECYSTECTOMY  2007   COLONOSCOPY  2011   Dr Janine Melbourne   COLONOSCOPY WITH PROPOFOL  N/A 02/25/2015   Procedure: COLONOSCOPY WITH PROPOFOL ;  Surgeon: Stephens Eis, MD;  Location: [facility];  Service: Gastroenterology;  Laterality: N/A;   CYSTOSCOPY WITH STENT PLACEMENT Left 11/05/2020   Procedure: CYSTOSCOPY WITH STENT PLACEMENT;  Surgeon: Geraline Knapp, MD;  Location: [facility] ORS;  Service: Urology;  Laterality: Left;   CYSTOSCOPY/URETEROSCOPY/HOLMIUM LASER/STENT PLACEMENT Left 12/07/2020   Procedure: CYSTOSCOPY/URETEROSCOPY/HOLMIUM LASER/STENT PLACEMENT;  Surgeon: Geraline Knapp, MD;  Location: [facility] ORS;  Service: Urology;  Laterality: Left;   DILATION AND CURETTAGE OF UTERUS  2004   HERNIA REPAIR Right 09/10/2007   Umbilical/ventral hernia repaired with 6.4 cm Proceed ventral patch   PORT-A-CATH REMOVAL     PORTACATH PLACEMENT  2013   RECURRENT HERNIA N/A 01/07/2008   Recurrent ventral hernia at the umbilicus, laparoscopy, open placement of a large Ultra Pro  mesh with trans-fascial sutures.   UPPER GI ENDOSCOPY  2011    FAMILY HISTORY :   Family History  Problem Relation Age of Onset   Lymphoma Father    Ovarian cancer Sister    Colon cancer Brother    Lymphoma Brother    Cancer Other        stomach   Cancer Other        stomach   Breast cancer Neg Hx     SOCIAL HISTORY:   Social History   Tobacco Use   Smoking status: Never   Smokeless tobacco: Never  Vaping Use   Vaping status: Never Used  Substance Use Topics   Alcohol  use: No   Drug use: No    ALLERGIES:  has no known allergies.  MEDICATIONS:  Current Outpatient Medications  Medication Sig Dispense Refill   acetaminophen  (TYLENOL ) 500 MG tablet Take 500 mg by mouth every 6 (six) hours as needed for mild pain or headache.      ARIPiprazole  (ABILIFY ) 2 MG tablet Take 1 tablet (2 mg total) by mouth at bedtime. 30 tablet 1   atorvastatin  (LIPITOR) 40  MG tablet Take 1 tablet (40 mg total) by mouth every evening. 90 tablet 3   buPROPion  (WELLBUTRIN  XL) 300 MG 24 hr tablet Take 1 tablet (300 mg total) by mouth daily. 90 tablet 1   Calcium  Carb-Cholecalciferol  600-800 MG-UNIT TABS Take 1 tablet by mouth daily.     cholecalciferol  (VITAMIN D ) 1000 units tablet Take 1,000 Units by mouth daily.     clopidogrel  (PLAVIX ) 75 MG tablet Take 1 tablet (75 mg total) by mouth daily. 90 tablet 2   cyanocobalamin  (VITAMIN B12) 500 MCG tablet Take 1 tablet (500 mcg total) by mouth daily. 30 tablet 3   fluticasone  (FLONASE ) 50 MCG/ACT nasal spray Place 2 sprays into both nostrils daily. 16 g 6   gabapentin  (NEURONTIN ) 100 MG capsule Take 1 capsule (100 mg total) by mouth 2 (two) times daily. 60 capsule 1   glucose blood (ONETOUCH VERIO) test strip Use as instructed to test blood sugar twice daily 100 strip 0   Lancets (ONETOUCH DELICA PLUS LANCET33G) MISC Use as instructed to test blood sugar twice daily 100 each 0   latanoprost  (XALATAN ) 0.005 % ophthalmic solution Place 1 drop into both  eyes at bedtime.      losartan  (COZAAR ) 50 MG tablet Take 1 tablet (50 mg total) by mouth daily. 90 tablet 3   metFORMIN  (GLUCOPHAGE ) 1000 MG tablet TAKE 1 TABLET TWICE DAILY WITH MEALS (Patient taking differently: Take 500 mg by mouth 2 (two) times daily with a meal. TAKE 1/2 TABLET TWICE DAILY WITH MEALS) 180 tablet 3   metoprolol  tartrate (LOPRESSOR ) 25 MG tablet Take 0.5 tablets (12.5 mg total) by mouth 2 (two) times daily. 90 tablet 2   Multiple Vitamin (MULTIVITAMIN WITH MINERALS) TABS tablet Take 1 tablet by mouth daily.     ondansetron  (ZOFRAN -ODT) 8 MG disintegrating tablet Take by mouth as needed.     pantoprazole  (PROTONIX ) 40 MG tablet Take 1 tablet (40 mg total) by mouth 2 (two) times daily. 180 tablet 3   pyridoxine  (B-6) 100 MG tablet Take 100 mg by mouth daily.     sertraline  (ZOLOFT ) 100 MG tablet Take 1.5 tablets (150 mg total) by mouth daily. 135 tablet 1   traMADol  (ULTRAM ) 50 MG tablet Take 50 mg by mouth at bedtime as needed.     traZODone  (DESYREL ) 50 MG tablet Take 0.5 tablets (25 mg total) by mouth at bedtime as needed for sleep. 45 tablet 1   vitamin C (ASCORBIC ACID ) 500 MG tablet Take 500 mg by mouth daily.     vitamin E  400 UNIT capsule Take 400 Units by mouth daily.     donepezil  (ARICEPT ) 5 MG tablet Take 1 tablet (5 mg total) by mouth at bedtime. (Patient not taking: Reported on 01/23/2024) 90 tablet 1   No current facility-administered medications for this visit.    PHYSICAL EXAMINATION: ECOG PERFORMANCE STATUS: 0 - Asymptomatic  BP (!) 141/78 (BP Location: Right Arm, Patient Position: Sitting, Cuff Size: Normal)   Pulse 62   Temp (!) 97.1 F (36.2 C) (Tympanic)   Resp 16   Ht 5' 6 (1.676 m)   Wt 148 lb 3.2 oz (67.2 kg)   SpO2 99%   BMI 23.92 kg/m   Filed Weights   01/23/24 1243  Weight: 148 lb 3.2 oz (67.2 kg)    Left breast- lump felt in upper quadrant- likely post surgical/radiation changes,  Physical Exam HENT:     Head: Normocephalic and  atraumatic.     Mouth/Throat:  Pharynx: No oropharyngeal exudate.   Eyes:     Pupils: Pupils are equal, round, and reactive to light.    Cardiovascular:     Rate and Rhythm: Normal rate and regular rhythm.  Pulmonary:     Effort: No respiratory distress.     Breath sounds: No wheezing.  Abdominal:     General: Bowel sounds are normal. There is no distension.     Palpations: Abdomen is soft. There is no mass.     Tenderness: There is no abdominal tenderness. There is no guarding or rebound.   Musculoskeletal:        General: No tenderness. Normal range of motion.     Cervical back: Normal range of motion and neck supple.   Skin:    General: Skin is warm.   Neurological:     Mental Status: She is alert and oriented to person, place, and time.   Psychiatric:        Mood and Affect: Affect normal.      LABORATORY DATA:  I have reviewed the data as listed    Component Value Date/Time   NA 140 01/23/2024 1244   NA 143 07/20/2023 1413   NA 140 12/23/2012 0949   K 4.1 01/23/2024 1244   K 4.7 12/23/2012 0949   CL 104 01/23/2024 1244   CL 101 12/23/2012 0949   CO2 29 01/23/2024 1244   CO2 30 12/23/2012 0949   GLUCOSE 78 01/23/2024 1244   GLUCOSE 168 (H) 12/23/2012 0949   BUN 14 01/23/2024 1244   BUN 12 07/20/2023 1413   BUN 9 12/23/2012 0949   CREATININE 0.96 01/23/2024 1244   CREATININE 0.92 09/04/2014 0916   CALCIUM  9.5 01/23/2024 1244   CALCIUM  9.8 12/23/2012 0949   PROT 7.3 01/23/2024 1244   PROT 7.0 07/20/2023 1413   PROT 7.4 09/04/2014 0916   ALBUMIN 4.0 01/23/2024 1244   ALBUMIN 4.5 07/20/2023 1413   ALBUMIN 3.7 09/04/2014 0916   AST 21 01/23/2024 1244   ALT 18 01/23/2024 1244   ALT 34 09/04/2014 0916   ALKPHOS 54 01/23/2024 1244   ALKPHOS 85 09/04/2014 0916   BILITOT 0.7 01/23/2024 1244   GFRNONAA 59 (L) 01/23/2024 1244   GFRNONAA >60 09/04/2014 0916   GFRNONAA >60 02/18/2014 1048   GFRAA 83 05/31/2020 1430   GFRAA >60 09/04/2014 0916   GFRAA  >60 02/18/2014 1048    No results found for: SPEP, UPEP  Lab Results  Component Value Date   WBC 4.4 01/23/2024   NEUTROABS 2.6 01/23/2024   HGB 13.4 01/23/2024   HCT 40.8 01/23/2024   MCV 90.3 01/23/2024   PLT 191 01/23/2024      Chemistry      Component Value Date/Time   NA 140 01/23/2024 1244   NA 143 07/20/2023 1413   NA 140 12/23/2012 0949   K 4.1 01/23/2024 1244   K 4.7 12/23/2012 0949   CL 104 01/23/2024 1244   CL 101 12/23/2012 0949   CO2 29 01/23/2024 1244   CO2 30 12/23/2012 0949   BUN 14 01/23/2024 1244   BUN 12 07/20/2023 1413   BUN 9 12/23/2012 0949   CREATININE 0.96 01/23/2024 1244   CREATININE 0.92 09/04/2014 0916      Component Value Date/Time   CALCIUM  9.5 01/23/2024 1244   CALCIUM  9.8 12/23/2012 0949   ALKPHOS 54 01/23/2024 1244   ALKPHOS 85 09/04/2014 0916   AST 21 01/23/2024 1244   ALT 18 01/23/2024 1244  ALT 34 09/04/2014 0916   BILITOT 0.7 01/23/2024 1244       ASSESSMENT & PLAN:   Carcinoma of upper-outer quadrant of right breast in female, estrogen receptor positive (HCC) # 2017 right breast cancer stage I ER/PR positive HER-2/neu negative status post lumpectomy. No chemotherapy/no onotype.G-1 Status post adjuvant radiation.  Stable . Discontinued Letrozole  in JAN 2023 .  [breast cancer index is low risk of recurrence ].   # last Mammo- AUg 20243- WN; but given lump [clinically post surgery-radiation changes]- recommend Diagnostic/Mammo-US  in few week.   # BMD: May 2022- T-score of -0.4. continue calcium  and vitamin D .  stable.will order in 2026- at next visit. Aaron Aas    #Stroke-[Jan2022]- with mild residual deficits.  No evidence of any malignancy on imaging.stable.   Will call with results of mammo # DISPOSITION:  #  Diagnostic/Mammo-US  - left breast 12'o clock mass/ Hx of breast cancer- s/p surgery-RT # Follow up in 12  months-MD--/labs-cbc/cmp/ca-27-29;-vit D 25-OH-  dr.B        Gwyn Leos, MD 01/23/2024 1:41 PM

## 2024-01-23 NOTE — Assessment & Plan Note (Addendum)
#   2017 right breast cancer stage I ER/PR positive HER-2/neu negative status post lumpectomy. No chemotherapy/no onotype.G-1 Status post adjuvant radiation.  Stable . Discontinued Letrozole  in JAN 2023 .  [breast cancer index is low risk of recurrence ].   # last Mammo- AUg 20243- WN; but given lump [clinically post surgery-radiation changes]- recommend Diagnostic/Mammo-US  in few week.   # BMD: May 2022- T-score of -0.4. continue calcium  and vitamin D .  stable.will order in 2026- at next visit. Aaron Aas    #Stroke-[Jan2022]- with mild residual deficits.  No evidence of any malignancy on imaging.stable.   Will call with results of mammo # DISPOSITION:  #  Diagnostic/Mammo-US  - left breast 12'o clock mass/ Hx of breast cancer- s/p surgery-RT # Follow up in 12  months-MD--/labs-cbc/cmp/ca-27-29;-vit D 25-OH-  dr.B

## 2024-01-23 NOTE — Progress Notes (Signed)
 Fell 12/07/23, slide off the sofa, no injuries.  C/o lump at incision site lt breast, tender, noticed this morning.  Daughter states appetite is 90%, pt has vascular dementia and forgets to eat at times, drinks a glucerna every day.

## 2024-01-24 DIAGNOSIS — H40153 Residual stage of open-angle glaucoma, bilateral: Secondary | ICD-10-CM | POA: Diagnosis not present

## 2024-01-24 LAB — CANCER ANTIGEN 27.29: CA 27.29: 15.2 U/mL (ref 0.0–38.6)

## 2024-02-01 ENCOUNTER — Ambulatory Visit: Payer: Medicare HMO | Admitting: Internal Medicine

## 2024-02-01 ENCOUNTER — Other Ambulatory Visit: Payer: Medicare HMO

## 2024-02-03 NOTE — Progress Notes (Unsigned)
 [facility] MD/PA/NP OP Progress Note  02/07/2024 5:41 PM Leah Schaefer  MRN:  [id number]  Chief Complaint:  Chief Complaint  Patient presents with   Follow-up   HPI:  - since the last visit, quetiapine  was switched to Abilify  due to concern of possible adverse reaction of cataract.   This is a follow-up appointment for depression and neurocognitive disorder.  Leah Schaefer, her daughter provides the majority of the story.  She states that Leah Schaefer is not doing anything.  Although she did exercise a few days prior to the appointment, she has not done anything otherwise.  She does not want to stick with her routine, and has no enthusiasm.  Although she arranged a wheelchair, she does not use this.  She states that she is ready to stop taking care of her, and put her in the nursing home.  She thinks it is not stroke related.  She is not compliant. Leah Schaefer thinks she is sabotaging, considering the history. Leah Schaefer feels dead already, taking care of her.  She later tearfully told Leah Schaefer that she does not necessarily want to put her in the nursing home, but reports challenges of continuing the care.   Leah Schaefer states that she has been doing fine.  When she was asked what she thinks about Leah Schaefer says to her, she states that if she wants me to go. I am not hitting or bothering. She later apologized to Leah Schaefer.  Although she does not necessarily feel safe, she states that she cannot explain the reason. (I don't really know. That's all about I know.)  She is aware she does not do anything.  She states that she used to go to senior center, or [facility] (Leah Schaefer states that she has not gone there for years).  She denies SI, HI, hallucinations (Leah Schaefer states that Leah Schaefer called the police with the thought that she saw girls outside, although there was nobody on the property).   Support: Household: daughter Marital status:widow (34 years of marriage. 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 It was alright, good support   Substance use   Tobacco Alcohol  Other substances/  Current   denies denies  Past     denies  Past Treatment           Visit Diagnosis:    ICD-10-CM   1. MDD (major depressive disorder), recurrent episode, mild (HCC)  F33.0     2. Mild neurocognitive disorder  G31.84     3. Insomnia, unspecified type  G47.00       Past Psychiatric History: Please see initial evaluation for full details. I have reviewed the history. No updates at this time.     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 Chemotherapy/post wide excision whole breast radiation.   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    Personal history of chemotherapy 2013   LEFT BREAST   Personal history of malignant neoplasm of breast    Personal history of radiation therapy 2017    Rt, 2013 had on left   Stroke (HCC) 09/06/2020   1 cm acute infarction in the right parietal deep and subcortical white matter   Vertigo     Past  Surgical History:  Procedure Laterality Date   BREAST BIOPSY Left 07/18/2011   +   BREAST BIOPSY Left 09/21/2015   neg   BREAST BIOPSY Left 04/05/2016   neg   BREAST BIOPSY Left 03/20/2019   us  bx 2 areas /fat necrosis at 9:30 ribbon and 5:30 coil   BREAST EXCISIONAL BIOPSY Right 08/2015   [facility]   BREAST LUMPECTOMY Right 09/30/2015   [facility] WITH MICROPAPILLARY FEATURES AND DCIS/CLEAR MARGINS, NEGATIVE LN   BREAST LUMPECTOMY Left 08/17/2011   BREAST LUMPECTOMY WITH SENTINEL LYMPH NODE BIOPSY Right 09/30/2015   Procedure: BREAST LUMPECTOMY WITH SENTINEL LYMPH NODE BX;  Surgeon: Reyes LELON Cota, MD;  Location: [facility] ORS;  Service: General;  Laterality: Right;   BREAST SURGERY Left 2012   wide excision   BREAST SURGERY Left November 2013   Core biopsy of the upper-outer quadrant showed fat necrosis.   CHOLECYSTECTOMY  2007   COLONOSCOPY  2011   Dr Ora   COLONOSCOPY WITH PROPOFOL  N/A 02/25/2015   Procedure: COLONOSCOPY WITH PROPOFOL ;  Surgeon: Deward CINDERELLA Ora, MD;  Location: [facility] ENDOSCOPY;  Service: Gastroenterology;  Laterality: N/A;   CYSTOSCOPY WITH STENT PLACEMENT Left 11/05/2020   Procedure: CYSTOSCOPY WITH STENT PLACEMENT;  Surgeon: Twylla Glendia BROCKS, MD;  Location: [facility] ORS;  Service: Urology;  Laterality: Left;   CYSTOSCOPY/URETEROSCOPY/HOLMIUM LASER/STENT PLACEMENT Left 12/07/2020   Procedure: CYSTOSCOPY/URETEROSCOPY/HOLMIUM LASER/STENT PLACEMENT;  Surgeon: Twylla Glendia BROCKS, MD;  Location: [facility] ORS;  Service: Urology;  Laterality: Left;   DILATION AND CURETTAGE OF UTERUS  2004   HERNIA REPAIR Right 09/10/2007   Umbilical/ventral hernia repaired with 6.4 cm Proceed ventral patch   PORT-A-CATH REMOVAL     PORTACATH PLACEMENT  2013   RECURRENT HERNIA N/A 01/07/2008   Recurrent ventral hernia at the umbilicus, laparoscopy, open placement of a large Ultra Pro mesh with trans-fascial sutures.   UPPER GI ENDOSCOPY  2011    Family Psychiatric History: Please see initial evaluation for full  details. I have reviewed the history. No updates at this time.     Family History:  Family History  Problem Relation Age of Onset   Lymphoma Father    Ovarian cancer Sister    Colon cancer Brother    Lymphoma Brother    Cancer Other        stomach   Cancer Other        stomach   Breast cancer Neg Hx     Social History:  Social History   Socioeconomic History   Marital status: Widowed    Spouse name: Not on file   Number of children: 2   Years of education: H/S   Highest education  level: 12th grade  Occupational History   Occupation: Retired  Tobacco Use   Smoking status: Never   Smokeless tobacco: Never  Vaping Use   Vaping status: Never Used  Substance and Sexual Activity   Alcohol  use: No   Drug use: No   Sexual activity: Not Currently  Other Topics Concern   Not on file  Social History Narrative   Lives alone   Social Drivers of Health   Financial Resource Strain: Low Risk  (10/25/2021)   Overall Financial Resource Strain (CARDIA)    Difficulty of Paying Living Expenses: Not hard at all  Food Insecurity: No Food Insecurity (04/12/2023)   Hunger Vital Sign    Worried About Running Out of Food in the Last Year: Never true    Ran Out of Food in the Last Year: Never true  Transportation Needs: No Transportation Needs (04/12/2023)   PRAPARE - Administrator, Civil Service (Medical): No    Lack of Transportation (Non-Medical): No  Physical Activity: Inactive (10/30/2022)   Exercise Vital Sign    Days of Exercise per Week: 0 days    Minutes of Exercise per Session: 0 min  Stress: No Stress Concern Present (10/30/2022)   [employer] of Occupational Health - Occupational Stress Questionnaire    Feeling of Stress : Not at all  Social Connections: Moderately Isolated (10/30/2022)   Social Connection and Isolation Panel    Frequency of Communication with Friends and Family: More than three times a week    Frequency of Social Gatherings with Friends and  Family: More than three times a week    Attends Religious Services: 1 to 4 times per year    Active Member of [employer] or Organizations: No    Attends Banker Meetings: Never    Marital Status: Widowed    Allergies: No Known Allergies  Metabolic Disorder Labs: Lab Results  Component Value Date   HGBA1C 6.5 (H) 07/20/2023   MPG 171.42 09/07/2020   MPG 157.07 11/07/2018   No results found for: PROLACTIN Lab Results  Component Value Date   CHOL 110 10/14/2021   TRIG 151 (H) 10/14/2021   HDL 33 (L) 10/14/2021   CHOLHDL 3.3 10/14/2021   VLDL 18 09/07/2020   LDLCALC 51 10/14/2021   LDLCALC 57 09/07/2020   Lab Results  Component Value Date   TSH 2.583 06/25/2023   TSH 2.110 10/14/2021    Therapeutic Level Labs: No results found for: LITHIUM No results found for: VALPROATE No results found for: CBMZ  Current Medications: Current Outpatient Medications  Medication Sig Dispense Refill   ARIPiprazole  (ABILIFY ) 5 MG tablet Take 1 tablet (5 mg total) by mouth daily. 30 tablet 1   acetaminophen  (TYLENOL ) 500 MG tablet Take 500 mg by mouth every 6 (six) hours as needed for mild pain or headache.      ARIPiprazole  (ABILIFY ) 2 MG tablet Take 1 tablet (2 mg total) by mouth at bedtime. 30 tablet 1   atorvastatin  (LIPITOR) 40 MG tablet Take 1 tablet (40 mg total) by mouth every evening. 90 tablet 3   buPROPion  (WELLBUTRIN  XL) 300 MG 24 hr tablet Take 1 tablet (300 mg total) by mouth daily. 90 tablet 1   Calcium  Carb-Cholecalciferol  600-800 MG-UNIT TABS Take 1 tablet by mouth daily.     cholecalciferol  (VITAMIN D ) 1000 units tablet Take 1,000 Units by mouth daily.     clopidogrel  (PLAVIX ) 75 MG tablet Take 1 tablet (75 mg total) by mouth  daily. 90 tablet 2   cyanocobalamin  (VITAMIN B12) 500 MCG tablet Take 1 tablet (500 mcg total) by mouth daily. 30 tablet 3   donepezil  (ARICEPT ) 5 MG tablet Take 1 tablet (5 mg total) by mouth at bedtime. (Patient not taking: Reported  on 01/23/2024) 90 tablet 1   fluticasone  (FLONASE ) 50 MCG/ACT nasal spray Place 2 sprays into both nostrils daily. 16 g 6   gabapentin  (NEURONTIN ) 100 MG capsule Take 1 capsule (100 mg total) by mouth 2 (two) times daily. 60 capsule 1   glucose blood (ONETOUCH VERIO) test strip Use as instructed to test blood sugar twice daily 100 strip 0   Lancets (ONETOUCH DELICA PLUS LANCET33G) MISC Use as instructed to test blood sugar twice daily 100 each 0   latanoprost  (XALATAN ) 0.005 % ophthalmic solution Place 1 drop into both eyes at bedtime.      losartan  (COZAAR ) 50 MG tablet Take 1 tablet (50 mg total) by mouth daily. 90 tablet 3   metFORMIN  (GLUCOPHAGE ) 1000 MG tablet TAKE 1 TABLET TWICE DAILY WITH MEALS (Patient taking differently: Take 500 mg by mouth 2 (two) times daily with a meal. TAKE 1/2 TABLET TWICE DAILY WITH MEALS) 180 tablet 3   metoprolol  tartrate (LOPRESSOR ) 25 MG tablet Take 0.5 tablets (12.5 mg total) by mouth 2 (two) times daily. 90 tablet 2   Multiple Vitamin (MULTIVITAMIN WITH MINERALS) TABS tablet Take 1 tablet by mouth daily.     ondansetron  (ZOFRAN -ODT) 8 MG disintegrating tablet Take by mouth as needed.     pantoprazole  (PROTONIX ) 40 MG tablet Take 1 tablet (40 mg total) by mouth 2 (two) times daily. 180 tablet 3   pyridoxine  (B-6) 100 MG tablet Take 100 mg by mouth daily.     sertraline  (ZOLOFT ) 100 MG tablet Take 1.5 tablets (150 mg total) by mouth daily. 135 tablet 1   traMADol  (ULTRAM ) 50 MG tablet Take 50 mg by mouth at bedtime as needed.     traZODone  (DESYREL ) 50 MG tablet Take 0.5 tablets (25 mg total) by mouth at bedtime as needed for sleep. 45 tablet 1   vitamin C (ASCORBIC ACID ) 500 MG tablet Take 500 mg by mouth daily.     vitamin E  400 UNIT capsule Take 400 Units by mouth daily.     No current facility-administered medications for this visit.     Musculoskeletal: Strength & Muscle Tone: within normal limits Gait & Station: normal Patient leans:  N/A  Psychiatric Specialty Exam: Review of Systems  Psychiatric/Behavioral:  Positive for behavioral problems and hallucinations. Negative for agitation, confusion, decreased concentration, dysphoric mood, self-injury, sleep disturbance and suicidal ideas. The patient is not nervous/anxious and is not hyperactive.   All other systems reviewed and are negative.   Blood pressure 133/85, pulse 75, temperature (!) 97.4 F (36.3 C), temperature source Temporal, height 5' 6 (1.676 m), weight 144 lb (65.3 kg).Body mass index is 23.24 kg/m.  General Appearance: Well Groomed  Eye Contact:  Good  Speech:  Clear and Coherent  Volume:  Normal  Mood:  fine  Affect:  Blunt  Thought Process:  Coherent  Orientation:  Other:  oriented to herself  Thought Content: Logical   Suicidal Thoughts:  No  Homicidal Thoughts:  No  Memory:  Immediate;   Good  Judgement:  Fair  Insight:  Shallow  Psychomotor Activity:  Normal, Normal tone, no rigidity, no resting/postural tremors, no tardive dyskinesia    Concentration:  Concentration: Good and Attention Span: Good  Recall:  Poor  Fund of Knowledge: Good  Language: Good  Akathisia:  No  Handed:  Right  AIMS (if indicated): 0   Assets:  Social Support  ADL's:  Impaired  Cognition: Impaired,  Mild  Sleep:  Fair   Screenings: GAD-7    Garment/textile technologist Visit from 10/26/2023 in [facility] Office Visit from 07/20/2023 in [facility] Office Visit from 06/08/2023 in [facility]  Total GAD-7 Score 5 9 7    PHQ2-9    Flowsheet Row Office Visit from 10/26/2023 in [facility] Office Visit from 07/20/2023 in [facility] Office Visit from 06/08/2023 in [facility] Office Visit from 02/02/2023 in [facility] Office Visit from 12/08/2022 in [facility]   PHQ-2 Total Score 3 4 6 2 6   PHQ-9 Total Score 8 16 17 7 13    Flowsheet Row ED from 12/15/2022 in [facility] Emergency Department at [facility] ED from 11/28/2022 in [facility] Emergency Department at [facility] ED from 09/23/2022 in [facility] Emergency Department at [facility]  C-SSRS RISK CATEGORY No Risk No Risk No Risk     Assessment and Plan:  Leah Schaefer is a 83 y.o. year old female with a history of depression, mixed dementia, frequent fall, history of 1 cm acute infarction in the right parietal deep and subcortical white matter causing left leg motor planning deficit, SVD, neuropathy, h/o breast cancer s/p chemotherapy, radiation, who is referred for depression.   1. MDD (major depressive disorder), recurrent episode, mild (HCC) Acute stressors include:  Other stressors include:  loss of her husband in 2020, s/p stroke. chemotherapy History: reportedly has a history of not telling the truth to provider, either to minimize, or  falsely complaining symptoms Although she reports overall improvement in her mood, it is difficult to assess in the context of cognitive impairment.  According to the caregiver, there has been no change in anhedonia, and is not active during the day.  Will uptitrate Abilify  to optimize treatment for depression given this has been also helpful for hallucinations.  Will continue current dose of sertraline  along with bupropion  to target depression.   2. Mild neurocognitive disorder Functional Status   IADL: Independent in the following:            Requires assistance with the following: managing finances, medications, driving ADL  Independent in the following: bathing and hygiene, continence, toileting, walking (walker)          Requires assistance with the following: grooming, feeding,  Folate, Vitamin B12, TSH wnl 06/2023 Images Neuropsych assessment:  Etiology: vascular   NPS- VH, sundowning- improving - hold on donepezil , seen  by Dr. Maree The exam is notable for calm demeanor, although she does not have much insight into her behaviors.  She continues to have hallucinations of seeing somebody, although she denies any delusion/VH of bugs crawling out since starting Abilify .  Will titrate the dose to optimize treatment for hallucinations.  It has been discussed regarding the metabolic side effect, QTc prolongation, EPS, and increased mortality for people with dementia.   3. Insomnia, unspecified type Overall improving.  Will continue current dose of trazodone  for insomnia.   # disposition  Her caregiver, daughter reports possible plan of concerning assisted living if no improvement.  Provided validation for the challenges of caregiving, as she appears to be very frustrated by  Leah Schaefer's lack of motivation.  Psychoeducation was provided  at length regarding the nature of progression of dementia, and neuropsychiatry symptoms, which includes hallucinations, paranoia, and emotional blunting.     Plan Continue sertraline  150 mg at night Continue bupropion  300 mg daily  Increase Abilify  5 mg daily  Continue trazodone  25 mg at night as needed for insomnia Next appointment: 8/28 at 3:30, IP   The patient demonstrates the following risk factors for suicide: Chronic risk factors for suicide include: psychiatric disorder of depression . Acute risk factors for suicide include: unemployment and loss (financial, interpersonal, professional). Protective factors for this patient include: positive social support and hope for the future. Considering these factors, the overall suicide risk at this point appears to be low. Patient is appropriate for outpatient follow up.   This visit involved a longitudinal and complex condition requiring extended medical decision-making, coordination of care, and management beyond what is typically captured in CPT 99214. The complexity of the patient's condition justifies the use of G2211.     Collaboration  of Care: Collaboration of Care: Other reviewed notes in Epic, discussed care plans with her caregiver  Patient/Guardian was advised Release of Information must be obtained prior to any record release in order to collaborate their care with an outside provider. Patient/Guardian was advised if they have not already done so to contact the registration department to sign all necessary forms in order for us  to release information regarding their care.   Consent: Patient/Guardian gives verbal consent for treatment and assignment of benefits for services provided during this visit. Patient/Guardian expressed understanding and agreed to proceed.    Katheren Sleet, MD 02/07/2024, 5:41 PM

## 2024-02-07 ENCOUNTER — Ambulatory Visit: Admitting: Psychiatry

## 2024-02-07 ENCOUNTER — Other Ambulatory Visit: Payer: Self-pay

## 2024-02-07 ENCOUNTER — Encounter: Payer: Self-pay | Admitting: Psychiatry

## 2024-02-07 VITALS — BP 133/85 | HR 75 | Temp 97.4°F | Ht 66.0 in | Wt 144.0 lb

## 2024-02-07 DIAGNOSIS — F330 Major depressive disorder, recurrent, mild: Secondary | ICD-10-CM | POA: Diagnosis not present

## 2024-02-07 DIAGNOSIS — G3184 Mild cognitive impairment, so stated: Secondary | ICD-10-CM

## 2024-02-07 DIAGNOSIS — G4700 Insomnia, unspecified: Secondary | ICD-10-CM

## 2024-02-07 MED ORDER — ARIPIPRAZOLE 5 MG PO TABS: 5.0000 mg | ORAL_TABLET | Freq: Every day | ORAL | 1 refills | Status: DC

## 2024-02-07 NOTE — Patient Instructions (Signed)
 Continue sertraline  150 mg at night Continue bupropion  300 mg daily  Increase Abilify  5 mg daily  Continue trazodone  25 mg at night as needed for insomnia Next appointment: 8/28 at 3:30

## 2024-02-20 ENCOUNTER — Other Ambulatory Visit: Payer: Self-pay | Admitting: Psychiatry

## 2024-02-22 ENCOUNTER — Ambulatory Visit
Admission: RE | Admit: 2024-02-22 | Discharge: 2024-02-22 | Disposition: A | Discharge status: Home / Self Care | Source: Ambulatory Visit | Attending: Internal Medicine | Admitting: Internal Medicine

## 2024-02-22 ENCOUNTER — Ambulatory Visit
Admission: RE | Admit: 2024-02-22 | Discharge: 2024-02-22 | Disposition: A | Discharge status: Home / Self Care | Source: Ambulatory Visit | Attending: Nurse Practitioner | Admitting: Nurse Practitioner

## 2024-02-22 DIAGNOSIS — Z170 Estrogen receptor positive status [ER+]: Secondary | ICD-10-CM | POA: Insufficient documentation

## 2024-02-22 DIAGNOSIS — N6320 Unspecified lump in the left breast, unspecified quadrant: Secondary | ICD-10-CM | POA: Insufficient documentation

## 2024-02-22 DIAGNOSIS — R92323 Mammographic fibroglandular density, bilateral breasts: Secondary | ICD-10-CM | POA: Diagnosis not present

## 2024-02-22 DIAGNOSIS — C50411 Malignant neoplasm of upper-outer quadrant of right female breast: Secondary | ICD-10-CM | POA: Insufficient documentation

## 2024-02-22 DIAGNOSIS — N6325 Unspecified lump in the left breast, overlapping quadrants: Secondary | ICD-10-CM | POA: Diagnosis not present

## 2024-03-05 ENCOUNTER — Encounter: Payer: Self-pay | Admitting: Urology

## 2024-03-19 ENCOUNTER — Ambulatory Visit (INDEPENDENT_AMBULATORY_CARE_PROVIDER_SITE_OTHER)

## 2024-03-19 DIAGNOSIS — Z0000 Encounter for general adult medical examination without abnormal findings: Secondary | ICD-10-CM

## 2024-03-19 NOTE — Patient Instructions (Signed)
 Ms. Leah Schaefer , Thank you for taking time out of your busy schedule to complete your Annual Wellness Visit with me. I enjoyed our conversation and look forward to speaking with you again next year. I, as well as your care team,  appreciate your ongoing commitment to your health goals. Please review the following plan we discussed and let me know if I can assist you in the future.  Follow up Visits: 03/25/25 @ 2:30 PM BY PHONE W/ DAUGHTER We will see or speak with you next year for your Next Medicare AWV with our clinical staff Have you seen your provider in the last 6 months (3 months if uncontrolled diabetes)? Yes  Clinician Recommendations:  Aim for 30 minutes of exercise or brisk walking, 6-8 glasses of water, and 5 servings of fruits and vegetables each day. TAKE CARE!      This is a list of the screenings recommended for you:  Health Maintenance  Topic Date Due   Zoster (Shingles) Vaccine (1 of 2) 03/15/1960   Eye exam for diabetics  01/10/2019   DTaP/Tdap/Td vaccine (4 - Td or Tdap) 05/04/2021   COVID-19 Vaccine (4 - 2024-25 season) 04/08/2023   Yearly kidney health urinalysis for diabetes  08/24/2023   Hemoglobin A1C  01/18/2024   Flu Shot  03/07/2024   Complete foot exam   10/25/2024   Yearly kidney function blood test for diabetes  01/22/2025   Mammogram  02/21/2025   Medicare Annual Wellness Visit  03/19/2025   DEXA scan (bone density measurement)  12/30/2025   Pneumococcal Vaccine for age over 81  Completed   Hepatitis B Vaccine  Aged Out   HPV Vaccine  Aged Out   Meningitis B Vaccine  Aged Out    Advanced directives: (ACP Link)Information on Advanced Care Planning can be found at [place]  Print production planner Health Care Directives Advance Health Care Directives. [email or web address]  Advance Care Planning is important because it:  [x]  Makes sure you receive the medical care that is consistent with your values, goals, and preferences  [x]  It provides guidance to your  family and loved ones and reduces their decisional burden about whether or not they are making the right decisions based on your wishes.  Follow the link provided in your after visit summary or read over the paperwork we have mailed to you to help you started getting your Advance Directives in place. If you need assistance in completing these, please reach out to us  so that we can help you!

## 2024-03-19 NOTE — Progress Notes (Signed)
 Subjective:   Leah Schaefer is a 83 y.o. who presents for a Medicare Wellness preventive visit.  As a reminder, Annual Wellness Visits don't include a physical exam, and some assessments may be limited, especially if this visit is performed virtually. We may recommend an in-person follow-up visit with your provider if needed.  Visit Complete: Virtual I connected with  Leah Schaefer on 03/19/24 by a audio enabled telemedicine application and verified that I am speaking with the correct person using two identifiers.  Patient Location: Home  Provider Location: Home Office  I discussed the limitations of evaluation and management by telemedicine. The patient expressed understanding and agreed to proceed.  Vital Signs: Because this visit was a virtual/telehealth visit, some criteria may be missing or patient reported. Any vitals not documented were not able to be obtained and vitals that have been documented are patient reported.  VideoDeclined- This patient declined Librarian, academic. Therefore the visit was completed with audio only.  Persons Participating in Visit: Patient assisted by Leah Schaefer.  AWV Questionnaire: No: Patient Medicare AWV questionnaire was not completed prior to this visit.  Cardiac Risk Factors include: advanced age (>17men, >81 women);diabetes mellitus;hypertension;dyslipidemia;sedentary lifestyle     Objective:    There were no vitals filed for this visit. There is no height or weight on file to calculate BMI.     03/19/2024    2:43 PM 01/23/2024   12:44 PM 02/02/2023    2:12 PM 12/14/2022    6:49 PM 11/28/2022    5:27 PM 10/30/2022    3:28 PM 09/23/2022    7:11 PM  Advanced Directives  Does Patient Have a Medical Advance Directive? No Yes No No No No No  Type of Energy manager of Healthcare Power of Attorney in Chart?  Yes - validated most recent copy scanned in chart (See row  information)       Would patient like information on creating a medical advance directive? No - Patient declined  No - Patient declined No - Patient declined       Current Medications (verified) Outpatient Encounter Medications as of 03/19/2024  Medication Sig   acetaminophen  (TYLENOL ) 500 MG tablet Take 500 mg by mouth every 6 (six) hours as needed for mild pain or headache.    ARIPiprazole  (ABILIFY ) 2 MG tablet Take 1 tablet (2 mg total) by mouth at bedtime.   ARIPiprazole  (ABILIFY ) 5 MG tablet Take 1 tablet (5 mg total) by mouth daily.   atorvastatin  (LIPITOR) 40 MG tablet Take 1 tablet (40 mg total) by mouth every evening.   buPROPion  (WELLBUTRIN  XL) 300 MG 24 hr tablet Take 1 tablet (300 mg total) by mouth daily.   Calcium  Carb-Cholecalciferol  600-800 MG-UNIT TABS Take 1 tablet by mouth daily.   cholecalciferol  (VITAMIN D ) 1000 units tablet Take 1,000 Units by mouth daily.   clopidogrel  (PLAVIX ) 75 MG tablet Take 1 tablet (75 mg total) by mouth daily.   cyanocobalamin  (VITAMIN B12) 500 MCG tablet Take 1 tablet (500 mcg total) by mouth daily.   fluticasone  (FLONASE ) 50 MCG/ACT nasal spray Place 2 sprays into both nostrils daily.   gabapentin  (NEURONTIN ) 100 MG capsule Take 1 capsule (100 mg total) by mouth 2 (two) times daily.   glucose blood (ONETOUCH VERIO) test strip Use as instructed to test blood sugar twice daily   Lancets (ONETOUCH DELICA PLUS LANCET33G) MISC Use as instructed to test blood  sugar twice daily   latanoprost  (XALATAN ) 0.005 % ophthalmic solution Place 1 drop into both eyes at bedtime.    losartan  (COZAAR ) 50 MG tablet Take 1 tablet (50 mg total) by mouth daily.   metFORMIN  (GLUCOPHAGE ) 1000 MG tablet TAKE 1 TABLET TWICE DAILY WITH MEALS   metoprolol  tartrate (LOPRESSOR ) 25 MG tablet Take 0.5 tablets (12.5 mg total) by mouth 2 (two) times daily.   Multiple Vitamin (MULTIVITAMIN WITH MINERALS) TABS tablet Take 1 tablet by mouth daily.   ondansetron  (ZOFRAN -ODT) 8 MG  disintegrating tablet Take by mouth as needed.   pantoprazole  (PROTONIX ) 40 MG tablet Take 1 tablet (40 mg total) by mouth 2 (two) times daily.   pyridoxine  (B-6) 100 MG tablet Take 100 mg by mouth daily.   sertraline  (ZOLOFT ) 100 MG tablet Take 1.5 tablets (150 mg total) by mouth daily.   traMADol  (ULTRAM ) 50 MG tablet Take 50 mg by mouth at bedtime as needed.   traZODone  (DESYREL ) 50 MG tablet Take 0.5 tablets (25 mg total) by mouth at bedtime as needed for sleep. (Patient taking differently: Take 25 mg by mouth at bedtime as needed for sleep. TAKES EVERY NIGHT)   vitamin E  400 UNIT capsule Take 400 Units by mouth daily.   donepezil  (ARICEPT ) 5 MG tablet Take 1 tablet (5 mg total) by mouth at bedtime. (Patient not taking: Reported on 03/19/2024)   vitamin C (ASCORBIC ACID ) 500 MG tablet Take 500 mg by mouth daily. (Patient not taking: Reported on 03/19/2024)   No facility-administered encounter medications on file as of 03/19/2024.    Allergies (verified) Patient has no known allergies.   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 Chemotherapy/post wide excision whole breast radiation.   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    Personal history of chemotherapy 2013   LEFT BREAST   Personal history of malignant neoplasm of breast    Personal history of radiation therapy 2017    Rt, 2013 had on left   Stroke (HCC) 09/06/2020   1 cm acute infarction in the right parietal deep and subcortical white matter   Vertigo    Past Surgical History:  Procedure Laterality Date   BREAST BIOPSY Left 07/18/2011   +   BREAST BIOPSY Left 09/21/2015   neg   BREAST BIOPSY Left 04/05/2016   neg   BREAST BIOPSY Left 03/20/2019   us  bx 2 areas /fat necrosis at 9:30 ribbon and 5:30 coil   BREAST EXCISIONAL BIOPSY Right 08/2015   [facility]  BREAST LUMPECTOMY Right 09/30/2015   [facility] WITH MICROPAPILLARY FEATURES AND DCIS/CLEAR MARGINS, NEGATIVE LN   BREAST LUMPECTOMY Left 08/17/2011   BREAST LUMPECTOMY WITH SENTINEL LYMPH NODE BIOPSY Right 09/30/2015   Procedure: BREAST LUMPECTOMY WITH SENTINEL LYMPH NODE BX;  Surgeon: Reyes LELON Cota, MD;  Location: [facility] ORS;  Service: General;  Laterality: Right;   BREAST SURGERY Left 2012   wide excision   BREAST SURGERY Left November 2013   Core biopsy of the upper-outer quadrant showed fat necrosis.   CHOLECYSTECTOMY  2007   COLONOSCOPY  2011   Dr Ora   COLONOSCOPY WITH PROPOFOL  N/A 02/25/2015   Procedure: COLONOSCOPY WITH PROPOFOL ;  Surgeon: Deward CINDERELLA Ora, MD;  Location: [facility] ENDOSCOPY;  Service: Gastroenterology;  Laterality: N/A;   CYSTOSCOPY WITH STENT PLACEMENT Left 11/05/2020   Procedure: CYSTOSCOPY WITH STENT PLACEMENT;  Surgeon: Twylla Glendia BROCKS, MD;  Location: [facility] ORS;  Service: Urology;  Laterality: Left;   CYSTOSCOPY/URETEROSCOPY/HOLMIUM LASER/STENT PLACEMENT Left 12/07/2020   Procedure: CYSTOSCOPY/URETEROSCOPY/HOLMIUM LASER/STENT PLACEMENT;  Surgeon: Twylla Glendia BROCKS, MD;  Location: [facility] ORS;  Service: Urology;  Laterality: Left;   DILATION AND CURETTAGE OF UTERUS  2004   HERNIA REPAIR Right 09/10/2007   Umbilical/ventral  hernia repaired with 6.4 cm Proceed ventral patch   PORT-A-CATH REMOVAL     PORTACATH PLACEMENT  2013   RECURRENT HERNIA N/A 01/07/2008   Recurrent ventral hernia at the umbilicus, laparoscopy, open placement of a large Ultra Pro mesh with trans-fascial sutures.   UPPER GI ENDOSCOPY  2011   Family History  Problem Relation Age of Onset   Lymphoma Father    Ovarian cancer Sister    Colon cancer Brother    Lymphoma Brother    Cancer Other        stomach   Cancer Other        stomach   Breast cancer Neg Hx    Social History   Socioeconomic History   Marital status: Widowed    Spouse name: Not on file   Number of children: 2   Years of education: H/S   Highest education level: 12th grade  Occupational History   Occupation: Retired  Tobacco Use   Smoking status: Never   Smokeless tobacco: Never  Vaping Use   Vaping status: Never Used  Substance and Sexual Activity   Alcohol  use: No   Drug use: No   Sexual activity: Not Currently  Other Topics Concern   Not on file  Social History Narrative   Lives alone   Social Drivers of Health   Financial Resource Strain: Low Risk  (03/19/2024)   Overall Financial Resource Strain (CARDIA)    Difficulty of Paying Living Expenses: Not hard at all  Food Insecurity: No Food Insecurity (03/19/2024)   Hunger Vital Sign    Worried About Running Out of Food in the Last Year: Never true    Ran Out of Food in the Last Year: Never true  Transportation Needs: No Transportation Needs (03/19/2024)   PRAPARE - Administrator, Civil Service (Medical): No    Lack of Transportation (Non-Medical): No  Physical Activity: Insufficiently Active (03/19/2024)   Exercise Vital Sign    Days of Exercise per Week: 2 days    Minutes of Exercise per Session: 30 min  Stress: No Stress Concern Present (03/19/2024)   [employer] of Occupational Health - Occupational Stress Questionnaire    Feeling of Stress: Not at all  Social Connections:  Unknown (03/19/2024)   Social  Connection and Isolation Panel    Frequency of Communication with Friends and Family: Twice a week    Frequency of Social Gatherings with Friends and Family: Once a week    Attends Religious Services: Not on Marketing executive or Organizations: No    Attends Banker Meetings: Never    Marital Status: Widowed    Tobacco Counseling Counseling given: Not Answered    Clinical Intake:  Pre-visit preparation completed: Yes  Pain : No/denies pain     BMI - recorded: 23.9 Nutritional Status: BMI of 19-24  Normal Nutritional Risks: None Diabetes: Yes CBG done?: No Did pt. bring in CBG monitor from home?: No  Lab Results  Component Value Date   HGBA1C 6.5 (H) 07/20/2023   HGBA1C 7.0 (H) 08/23/2022   HGBA1C 7.0 (A) 01/19/2022     How often do you need to have someone help you when you read instructions, pamphlets, or other written materials from your doctor or pharmacy?: 1 - Never  Interpreter Needed?: No  Information entered by :: Leah Schaefer, Leah Schaefer   Activities of Daily Living    03/19/2024    2:45 PM  In your present state of health, do you have any difficulty performing the following activities:  Hearing? 0  Vision? 0  Difficulty concentrating or making decisions? 1  Comment SOMETIMES  Walking or climbing stairs? 1  Dressing or bathing? 1  Doing errands, shopping? 1  Preparing Food and eating ? Y  Using the Toilet? N  In the past six months, have you accidently leaked urine? N  Do you have problems with loss of bowel control? N  Managing your Medications? Y  Managing your Finances? Y  Housekeeping or managing your Housekeeping? Y    Patient Care Team: Sharma Coyer, MD as PCP - General (Family Medicine) Dessa, Reyes ORN, MD (General Surgery) Rennie Cindy SAUNDERS, MD as Consulting Physician (Internal Medicine) Carolee Manus DASEN., MD as Consulting Physician (Ophthalmology) Lenn Aran,  MD as Referring Physician (Radiation Oncology) Vickey Mettle, MD as Consulting Physician (Psychiatry)  I have updated your Care Teams any recent Medical Services you may have received from other providers in the past year.     Assessment:   This is a routine wellness examination for Leah Schaefer.  Hearing/Vision screen Hearing Screening - Comments:: NO AIDS Vision Screening - Comments:: [facility]- DR.BELL- APPT 2-3 WEEKS AGO   Goals Addressed             This Visit's Progress    DIET - INCREASE WATER INTAKE         Depression Screen     03/19/2024    2:41 PM 10/26/2023    1:20 PM 07/20/2023    1:05 PM 06/08/2023    1:19 PM 02/02/2023    1:21 PM 12/08/2022    3:42 PM 10/30/2022    3:22 PM  PHQ 2/9 Scores  PHQ - 2 Score 0 3 4 6 2 6 3   PHQ- 9 Score 0 8 16 17 7 13 8     Fall Risk     03/19/2024    2:43 PM 10/26/2023    1:21 PM 07/20/2023    1:05 PM 06/08/2023    1:19 PM 12/08/2022    3:42 PM  Fall Risk   Falls in the past year? 1 1 1  0 1  Number falls in past yr: 1 1 1  0 1  Comment  8 FALLS FROM TRIPPING  Injury with Fall? 0 0 1 0 1  Risk for fall due to :   Impaired balance/gait No Fall Risks   Follow up Falls evaluation completed;Falls prevention discussed   Falls evaluation completed     MEDICARE RISK AT HOME:  Medicare Risk at Home Any stairs in or around the home?: Yes If so, are there any without handrails?: No Home free of loose throw rugs in walkways, pet beds, electrical cords, etc?: Yes Adequate lighting in your home to reduce risk of falls?: Yes Life alert?: No Use of a cane, walker or w/c?: Yes (WALKER ALL THE TIME) Grab bars in the bathroom?: Yes Shower chair or bench in shower?: Yes Elevated toilet seat or a handicapped toilet?: No  TIMED UP AND GO:  Was the test performed?  No  Cognitive Function: 6CIT completed        03/19/2024    2:47 PM 10/30/2022    3:25 PM 12/06/2016    1:59 PM  6CIT Screen  What Year? 0 points 0 points 0 points  What  month? 3 points 3 points 0 points  What time? 0 points 0 points 0 points  Count back from 20 0 points 0 points 0 points  Months in reverse 4 points 4 points 4 points  Repeat phrase 4 points 8 points 4 points  Total Score 11 points 15 points 8 points    Immunizations Immunization History  Administered Date(s) Administered   Fluad Quad(high Dose 65+) 06/24/2019, 05/03/2020, 05/11/2021, 05/23/2022   Fluad Trivalent(High Dose 65+) 07/20/2023   Influenza, High Dose Seasonal PF 05/13/2014, 05/29/2015, 04/05/2016, 04/19/2017, 04/11/2018   Influenza,inj,Quad PF,6+ Mos 05/23/2013   PFIZER(Purple Top)SARS-COV-2 Vaccination 10/17/2019, 11/11/2019, 07/07/2020   Pneumococcal Conjugate-13 05/13/2014   Pneumococcal Polysaccharide-23 06/07/2012   Td 04/19/2007, 05/05/2011   Tdap 05/05/2011   Zoster, Live 04/27/2008    Screening Tests Health Maintenance  Topic Date Due   Zoster Vaccines- Shingrix (1 of 2) 03/15/1960   OPHTHALMOLOGY EXAM  01/10/2019   DTaP/Tdap/Td (4 - Td or Tdap) 05/04/2021   COVID-19 Vaccine (4 - 2024-25 season) 04/08/2023   Diabetic kidney evaluation - Urine ACR  08/24/2023   HEMOGLOBIN A1C  01/18/2024   INFLUENZA VACCINE  03/07/2024   FOOT EXAM  10/25/2024   Diabetic kidney evaluation - eGFR measurement  01/22/2025   MAMMOGRAM  02/21/2025   Medicare Annual Wellness (AWV)  03/19/2025   DEXA SCAN  12/30/2025   Pneumococcal Vaccine: 50+ Years  Completed   Hepatitis B Vaccines  Aged Out   HPV VACCINES  Aged Out   Meningococcal B Vaccine  Aged Out    Health Maintenance  Health Maintenance Due  Topic Date Due   Zoster Vaccines- Shingrix (1 of 2) 03/15/1960   OPHTHALMOLOGY EXAM  01/10/2019   DTaP/Tdap/Td (4 - Td or Tdap) 05/04/2021   COVID-19 Vaccine (4 - 2024-25 season) 04/08/2023   Diabetic kidney evaluation - Urine ACR  08/24/2023   HEMOGLOBIN A1C  01/18/2024   INFLUENZA VACCINE  03/07/2024   Health Maintenance Items Addressed: UP TO DATE ON MAMMOGRAM & BDS;  AGED OUT OF COLONOSCOPY; NEEDS TDAP, COVID, PNA SHOTS  Additional Screening:  Vision Screening: Recommended annual ophthalmology exams for early detection of glaucoma and other disorders of the eye. Would you like a referral to an eye doctor? No    Dental Screening: Recommended annual dental exams for proper oral hygiene  Community Resource Referral / Chronic Care Management: CRR required this visit?  No   CCM required  this visit?  No   Plan:    I have personally reviewed and noted the following in the patient's chart:   Medical and social history Use of alcohol , tobacco or illicit drugs  Current medications and supplements including opioid prescriptions. Patient is not currently taking opioid prescriptions. Functional ability and status Nutritional status Physical activity Advanced directives List of other physicians Hospitalizations, surgeries, and ER visits in previous 12 months Vitals Screenings to include cognitive, depression, and falls Referrals and appointments  In addition, I have reviewed and discussed with patient certain preventive protocols, quality metrics, and best practice recommendations. A written personalized care plan for preventive services as well as general preventive health recommendations were provided to patient.   Leah GORMAN Schaefer, Leah Schaefer   [birth year]   After Visit Summary: (MyChart) Due to this being a telephonic visit, the after visit summary with patients personalized plan was offered to patient via MyChart   Notes: Nothing significant to report at this time.

## 2024-03-25 ENCOUNTER — Other Ambulatory Visit: Payer: Self-pay | Admitting: Family Medicine

## 2024-03-25 ENCOUNTER — Other Ambulatory Visit: Payer: Self-pay

## 2024-03-25 ENCOUNTER — Other Ambulatory Visit (HOSPITAL_COMMUNITY): Payer: Self-pay

## 2024-03-25 DIAGNOSIS — F339 Major depressive disorder, recurrent, unspecified: Secondary | ICD-10-CM

## 2024-03-25 DIAGNOSIS — R4589 Other symptoms and signs involving emotional state: Secondary | ICD-10-CM

## 2024-03-25 MED ORDER — SERTRALINE HCL 100 MG PO TABS
150.0000 mg | ORAL_TABLET | Freq: Every day | ORAL | 1 refills | Status: DC
  Filled 2024-03-25: qty 135, 90d supply, fill #0
  Filled 2024-06-16 – 2024-06-23 (×2): qty 135, 90d supply, fill #1

## 2024-03-25 MED ORDER — BUPROPION HCL ER (XL) 300 MG PO TB24
300.0000 mg | ORAL_TABLET | Freq: Every day | ORAL | 1 refills | Status: DC
  Filled 2024-03-25: qty 90, 90d supply, fill #0
  Filled 2024-06-16 – 2024-06-23 (×2): qty 90, 90d supply, fill #1

## 2024-03-26 ENCOUNTER — Other Ambulatory Visit: Payer: Self-pay

## 2024-03-28 NOTE — Progress Notes (Signed)
 Pharmacy Quality Measure Review  This patient is appearing on a report for being at risk of failing the adherence measure for cholesterol (statin) medications this calendar year.   Medication: losartan  50 mg Last fill date: 03/25/24 for 90 day supply  Medication: atorvastatin  40 mg  Last fill date: 03/25/24 for 90 day supply  Insurance report was not up to date. No action needed at this time.   Lilas Diefendorf E. Marsh, PharmD Clinical Pharmacist [facility] [phone]

## 2024-03-29 NOTE — Progress Notes (Unsigned)
 [facility] MD/PA/NP OP Progress Note  04/03/2024 4:02 PM Leah Schaefer  MRN:  [id number]  Chief Complaint:  Chief Complaint  Patient presents with   Follow-up   HPI:  This is a follow-up appointment for neurocognitive disorder, depression and skin picking. She states that she has been feeling a lot better.  She does not do exercise that she should do.  She reports fatigue and anhedonia.  She wishes to walk without a walker.  She reports covering with a quilt at night, and it is warm. She does not want to use a sheet as it could be thick.  She spends time watching TV.  She is hoping to work on exercise she learned from PT/OT.  She denies SI, HI, hallucinations.   Leah Schaefer, her daughter presents to the visit.  She agrees that she has been doing better in some areas since uptitration of Abilify .  Her sleeping is better.  She is not seeing things, which occurred 3 weeks ago.  She is not doing skin picking, although she may be picking her hair at times.  She is concerned that her hygiene is not good.  Her mother helps her to taking a bath. She is encouraging her to wipe genital area when she go to the bathroom.  She may take a nap up to 1 hour.  She had a fall once since the last visit.  She went to the bathroom, letting her walker in the darkness.   They both agreed with the plans as outlined below.   Wt Readings from Last 3 Encounters:  04/03/24 150 lb 3.2 oz (68.1 kg)  02/07/24 144 lb (65.3 kg)  01/23/24 148 lb 3.2 oz (67.2 kg)     Support: Household: daughter Marital status:widow (34 years of marriage. 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 It was alright, good support   Substance use   Tobacco Alcohol  Other substances/  Current   denies denies  Past     denies  Past Treatment            Visit Diagnosis:    ICD-10-CM   1. MDD  (major depressive disorder), recurrent episode, mild (HCC)  F33.0     2. Mild neurocognitive disorder  G31.84     3. Insomnia, unspecified type  G47.00       Past Psychiatric History: Please see initial evaluation for full details. I have reviewed the history. No updates at this time.     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 Chemotherapy/post wide excision whole breast radiation.   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    Personal history of chemotherapy 2013   LEFT BREAST   Personal history of malignant neoplasm of breast    Personal history of radiation therapy 2017    Rt, 2013 had on left   Stroke (HCC) 09/06/2020   1 cm acute infarction in the right parietal deep and subcortical white matter   Vertigo     Past Surgical History:  Procedure Laterality Date   BREAST BIOPSY Left 07/18/2011   +   BREAST BIOPSY Left 09/21/2015   neg   BREAST BIOPSY Left 04/05/2016   neg   BREAST BIOPSY Left 03/20/2019   us  bx 2 areas /fat necrosis at 9:30 ribbon and 5:30 coil   BREAST EXCISIONAL BIOPSY Right 08/2015   [facility]   BREAST LUMPECTOMY Right  09/30/2015   [facility] WITH MICROPAPILLARY FEATURES AND DCIS/CLEAR MARGINS, NEGATIVE LN   BREAST LUMPECTOMY Left 08/17/2011   BREAST LUMPECTOMY WITH SENTINEL LYMPH NODE BIOPSY Right 09/30/2015   Procedure: BREAST LUMPECTOMY WITH SENTINEL LYMPH NODE BX;  Surgeon: Reyes LELON Cota, MD;  Location: [facility] ORS;  Service: General;  Laterality: Right;   BREAST SURGERY Left 2012   wide excision   BREAST SURGERY Left November 2013   Core biopsy of the upper-outer quadrant showed fat necrosis.   CHOLECYSTECTOMY  2007   COLONOSCOPY  2011   Dr Ora   COLONOSCOPY WITH PROPOFOL  N/A 02/25/2015   Procedure: COLONOSCOPY WITH PROPOFOL ;  Surgeon: Deward CINDERELLA Ora, MD;  Location[facility] ENDOSCOPY;  Service: Gastroenterology;  Laterality: N/A;   CYSTOSCOPY WITH STENT PLACEMENT Left 11/05/2020   Procedure: CYSTOSCOPY WITH STENT PLACEMENT;  Surgeon: Twylla Glendia BROCKS, MD;  Location: [facility] ORS;  Service: Urology;  Laterality: Left;   CYSTOSCOPY/URETEROSCOPY/HOLMIUM LASER/STENT PLACEMENT Left 12/07/2020   Procedure: CYSTOSCOPY/URETEROSCOPY/HOLMIUM LASER/STENT PLACEMENT;  Surgeon: Twylla Glendia BROCKS, MD;  Location: [facility] ORS;  Service: Urology;  Laterality: Left;   DILATION AND CURETTAGE OF UTERUS  2004   HERNIA REPAIR Right 09/10/2007   Umbilical/ventral hernia repaired with 6.4 cm Proceed ventral patch   PORT-A-CATH REMOVAL     PORTACATH PLACEMENT  2013   RECURRENT HERNIA N/A 01/07/2008   Recurrent ventral hernia at the umbilicus, laparoscopy, open placement of a large Ultra Pro mesh with trans-fascial sutures.   UPPER GI ENDOSCOPY  2011    Family Psychiatric History: Please see initial evaluation for full details. I have reviewed the history. No updates at this time.    Family History:  Family History  Problem Relation Age of Onset   Lymphoma Father    Ovarian cancer Sister    Colon cancer Brother    Lymphoma Brother    Cancer Other        stomach   Cancer Other        stomach   Breast cancer Neg Hx     Social History:   Social History   Socioeconomic History   Marital status: Widowed    Spouse name: Not on file   Number of children: 2   Years of education: H/S   Highest education level: 12th grade  Occupational History   Occupation: Retired  Tobacco Use   Smoking status: Never   Smokeless tobacco: Never  Vaping Use   Vaping status: Never Used  Substance and Sexual Activity  Alcohol  use: No   Drug use: No   Sexual activity: Not Currently  Other Topics Concern   Not on file  Social History Narrative   Lives alone   Social Drivers of Health   Financial Resource Strain: Low Risk  (03/19/2024)   Overall Financial Resource Strain (CARDIA)    Difficulty of Paying Living Expenses: Not hard at all  Food Insecurity: No Food Insecurity (03/19/2024)   Hunger Vital Sign    Worried About Running Out of Food in the Last Year: Never true    Ran Out of Food in the Last Year: Never true  Transportation Needs: No Transportation Needs (03/19/2024)   PRAPARE - Administrator, Civil Service (Medical): No    Lack of Transportation (Non-Medical): No  Physical Activity: Insufficiently Active (03/19/2024)   Exercise Vital Sign    Days of Exercise per Week: 2 days    Minutes of Exercise per Session: 30 min  Stress: No Stress Concern Present (03/19/2024)   [employer] of Occupational Health - Occupational Stress Questionnaire    Feeling of Stress: Not at all  Social Connections: Unknown (03/19/2024)   Social Connection and Isolation Panel    Frequency of Communication with Friends and Family: Twice a week    Frequency of Social Gatherings with Friends and Family: Once a week    Attends Religious Services: Not on Insurance claims handler of Clubs or Organizations: No    Attends Banker Meetings: Never    Marital Status: Widowed    Allergies: No Known Allergies  Metabolic Disorder Labs: Lab Results  Component Value Date   HGBA1C 6.5 (H) 07/20/2023   MPG 171.42 09/07/2020   MPG  157.07 11/07/2018   No results found for: PROLACTIN Lab Results  Component Value Date   CHOL 110 10/14/2021   TRIG 151 (H) 10/14/2021   HDL 33 (L) 10/14/2021   CHOLHDL 3.3 10/14/2021   VLDL 18 09/07/2020   LDLCALC 51 10/14/2021   LDLCALC 57 09/07/2020   Lab Results  Component Value Date   TSH 2.583 06/25/2023   TSH 2.110 10/14/2021    Therapeutic Level Labs: No results found for: LITHIUM No results found for: VALPROATE No results found for: CBMZ  Current Medications: Current Outpatient Medications  Medication Sig Dispense Refill   acetaminophen  (TYLENOL ) 500 MG tablet Take 500 mg by mouth every 6 (six) hours as needed for mild pain or headache.      ARIPiprazole  (ABILIFY ) 2 MG tablet Take 1 tablet (2 mg total) by mouth at bedtime. 30 tablet 1   ARIPiprazole  (ABILIFY ) 5 MG tablet Take 1 tablet (5 mg total) by mouth daily. 30 tablet 1   atorvastatin  (LIPITOR) 40 MG tablet Take 1 tablet (40 mg total) by mouth every evening. 90 tablet 3   buPROPion  (WELLBUTRIN  XL) 300 MG 24 hr tablet Take 1 tablet (300 mg total) by mouth daily. 90 tablet 1   Calcium  Carb-Cholecalciferol  600-800 MG-UNIT TABS Take 1 tablet by mouth daily.     cholecalciferol  (VITAMIN D ) 1000 units tablet Take 1,000 Units by mouth daily.     clopidogrel  (PLAVIX ) 75 MG tablet Take 1 tablet (75 mg total) by mouth daily. 90 tablet 2   cyanocobalamin  (VITAMIN B12) 500 MCG tablet Take 1 tablet (500 mcg total) by mouth daily. 30 tablet 3   donepezil  (ARICEPT ) 5 MG tablet Take 1 tablet (5 mg total) by mouth at bedtime. (Patient not taking: Reported on 03/19/2024) 90 tablet  1   fluticasone  (FLONASE ) 50 MCG/ACT nasal spray Place 2 sprays into both nostrils daily. 16 g 6   gabapentin  (NEURONTIN ) 100 MG capsule Take 1 capsule (100 mg total) by mouth 2 (two) times daily. 60 capsule 1   glucose blood (ONETOUCH VERIO) test strip Use as instructed to test blood sugar twice daily 100 strip 0   Lancets (ONETOUCH DELICA PLUS  LANCET33G) MISC Use as instructed to test blood sugar twice daily 100 each 0   latanoprost  (XALATAN ) 0.005 % ophthalmic solution Place 1 drop into both eyes at bedtime.      losartan  (COZAAR ) 50 MG tablet Take 1 tablet (50 mg total) by mouth daily. 90 tablet 3   metFORMIN  (GLUCOPHAGE ) 1000 MG tablet TAKE 1 TABLET TWICE DAILY WITH MEALS 180 tablet 3   metoprolol  tartrate (LOPRESSOR ) 25 MG tablet Take 0.5 tablets (12.5 mg total) by mouth 2 (two) times daily. 90 tablet 2   Multiple Vitamin (MULTIVITAMIN WITH MINERALS) TABS tablet Take 1 tablet by mouth daily.     ondansetron  (ZOFRAN -ODT) 8 MG disintegrating tablet Take by mouth as needed.     pantoprazole  (PROTONIX ) 40 MG tablet Take 1 tablet (40 mg total) by mouth 2 (two) times daily. 180 tablet 3   pyridoxine  (B-6) 100 MG tablet Take 100 mg by mouth daily.     sertraline  (ZOLOFT ) 100 MG tablet Take 1.5 tablets (150 mg total) by mouth daily. 135 tablet 1   traMADol  (ULTRAM ) 50 MG tablet Take 50 mg by mouth at bedtime as needed.     traZODone  (DESYREL ) 50 MG tablet Take 0.5 tablets (25 mg total) by mouth at bedtime as needed for sleep. (Patient taking differently: Take 25 mg by mouth at bedtime as needed for sleep. TAKES EVERY NIGHT) 45 tablet 1   vitamin C (ASCORBIC ACID ) 500 MG tablet Take 500 mg by mouth daily. (Patient not taking: Reported on 03/19/2024)     vitamin E  400 UNIT capsule Take 400 Units by mouth daily.     No current facility-administered medications for this visit.     Musculoskeletal: Strength & Muscle Tone: within normal limits Gait & Station: uses a walker Patient leans: N/A  Psychiatric Specialty Exam: Review of Systems  Psychiatric/Behavioral:  Positive for dysphoric mood. Negative for agitation, behavioral problems, confusion, decreased concentration, hallucinations, self-injury, sleep disturbance and suicidal ideas. The patient is not nervous/anxious and is not hyperactive.   All other systems reviewed and are  negative.   Blood pressure 129/80, pulse 69, temperature (!) 97.3 F (36.3 C), temperature source Temporal, height 5' 6 (1.676 m), weight 150 lb 3.2 oz (68.1 kg).Body mass index is 24.24 kg/m.  General Appearance: Fairly Groomed  Eye Contact:  Good  Speech:  Clear and Coherent  Volume:  Normal  Mood:  fine  Affect:  Appropriate, Congruent, and less restricted  Thought Process:  Coherent, illogical at times  Orientation:  Full (Time, Place, and Person)  Thought Content: Logical   Suicidal Thoughts:  No  Homicidal Thoughts:  No  Memory:  Immediate;   Good  Judgement:  Fair  Insight:  Present  Psychomotor Activity:  Normal  Concentration:  Concentration: Good and Attention Span: Good  Recall:  Good  Fund of Knowledge: Good  Language: Good  Akathisia:  No  Handed:  Right  AIMS (if indicated): not done  Assets:  Communication Skills Desire for Improvement  ADL's:  Intact  Cognition: WNL  Sleep:  Fair   Screenings: GAD-7  Flowsheet Row Office Visit from 10/26/2023 in [facility] Office Visit from 07/20/2023 in [facility] Office Visit from 06/08/2023 in [facility]  Total GAD-7 Score 5 9 7    PHQ2-9    Flowsheet Row Clinical Support from 03/19/2024 in [facility] Office Visit from 10/26/2023 in [facility] Office Visit from 07/20/2023 in [facility] Office Visit from 06/08/2023 in [facility] Office Visit from 02/02/2023 in [facility]  PHQ-2 Total Score 0 3 4 6 2   PHQ-9 Total Score 0 8 16 17 7    Flowsheet Row ED from 12/15/2022 in [facility] Emergency Department at [facility] ED from 11/28/2022 in [facility] Department at [facility] ED from 09/23/2022 in [facility] Emergency Department at [facility]  C-SSRS RISK CATEGORY No Risk No  Risk No Risk     Assessment and Plan:  NONI STONESIFER is a 83 y.o. year old female with a history of depression, mixed dementia, frequent fall, history of 1 cm acute infarction in the right parietal deep and subcortical white matter causing left leg motor planning deficit, SVD, neuropathy, h/o breast cancer s/p chemotherapy, radiation, who is referred for depression.   1. MDD (major depressive disorder), recurrent episode, mild (HCC) Other stressors include:  loss of her husband in 2020, s/p stroke. chemotherapy History: reportedly has a history of not telling the truth to provider, either to minimize, or  falsely complaining symptoms The exam is notable for brighter affect, and she reports overall improvement in her mood symptoms, which coincided with uptitration of Abilify .  Although she may benefit from further uptitration given her anhedonia, will maintain on the current dose at this time considering her age/metabolism.  Will continue current dose of sertraline  along with bupropion  to target depression.    2. Mild neurocognitive disorder Functional Status   IADL: Independent in the following:            Requires assistance with the following: managing finances, medications, driving ADL  Independent in the following: bathing and hygiene, continence, toileting, walking (walker)          Requires assistance with the following: grooming, feeding,  Folate, Vitamin B12, TSH wnl 06/2023 [facility] Neuropsych assessment:  Etiology: vascular   NPS- VH, sundowning- improving - hold on donepezil , seen by Dr. Maree The exam is notable for calmer demeanor. according to the caregiver, there has been improvement in hallucinations since uptitration of Abilify .  Will continue the current dose to target behavior issues associated with dementia and hallucinations.  Discussed potential metabolic side effect, EPS, QTc prolongation and increased mortality for people with dementia.   # skin picking Significant  improvement since uptitration of Abilify .  Will maintain on the current dose.   # Insomnia Overall improving.  Will continue current dose of trazodone  as needed for insomnia.   3. Insomnia, unspecified type Overall improving.  Will continue current dose of trazodone  for insomnia.    # disposition  Her caregiver, daughter reports possible plan of concerning assisted living if no improvement.  Provided validation at the previous visit for the challenges of caregiving, as she appears to be very frustrated by Zilpha's lack of motivation.  Psychoeducation was provided  at length regarding the nature of progression of dementia, and neuropsychiatry symptoms, which includes hallucinations, paranoia, and emotional blunting.     Plan Continue sertraline  150 mg  at night Continue bupropion  300 mg daily  Continue Abilify  5 mg daily  Continue trazodone  25 mg at night as needed for insomnia Next appointment: 10/30 at 3 pm. IP   The patient demonstrates the following risk factors for suicide: Chronic risk factors for suicide include: psychiatric disorder of depression . Acute risk factors for suicide include: unemployment and loss (financial, interpersonal, professional). Protective factors for this patient include: positive social support and hope for the future. Considering these factors, the overall suicide risk at this point appears to be low. Patient is appropriate for outpatient follow up.   Collaboration of Care: Collaboration of Care: Other reviewed notes in Epic  Patient/Guardian was advised Release of Information must be obtained prior to any record release in order to collaborate their care with an outside provider. Patient/Guardian was advised if they have not already done so to contact the registration department to sign all necessary forms in order for us  to release information regarding their care.   Consent: Patient/Guardian gives verbal consent for treatment and assignment of benefits for  services provided during this visit. Patient/Guardian expressed understanding and agreed to proceed.    Katheren Sleet, MD 04/03/2024, 4:02 PM

## 2024-04-03 ENCOUNTER — Encounter: Payer: Self-pay | Admitting: Psychiatry

## 2024-04-03 ENCOUNTER — Ambulatory Visit (INDEPENDENT_AMBULATORY_CARE_PROVIDER_SITE_OTHER): Admitting: Psychiatry

## 2024-04-03 ENCOUNTER — Other Ambulatory Visit: Payer: Self-pay

## 2024-04-03 VITALS — BP 129/80 | HR 69 | Temp 97.3°F | Ht 66.0 in | Wt 150.2 lb

## 2024-04-03 DIAGNOSIS — G4700 Insomnia, unspecified: Secondary | ICD-10-CM

## 2024-04-03 DIAGNOSIS — G3184 Mild cognitive impairment, so stated: Secondary | ICD-10-CM | POA: Diagnosis not present

## 2024-04-03 DIAGNOSIS — F330 Major depressive disorder, recurrent, mild: Secondary | ICD-10-CM

## 2024-04-03 NOTE — Patient Instructions (Signed)
 Continue sertraline  150 mg at night Continue bupropion  300 mg daily  Continue Abilify  5 mg daily  Continue trazodone  25 mg at night as needed for insomnia Next appointment: 10/30 at 3 pm

## 2024-04-07 ENCOUNTER — Other Ambulatory Visit: Payer: Self-pay | Admitting: Psychiatry

## 2024-04-25 ENCOUNTER — Ambulatory Visit: Admitting: Family Medicine

## 2024-04-25 ENCOUNTER — Encounter: Payer: Self-pay | Admitting: Family Medicine

## 2024-04-25 VITALS — BP 132/77 | HR 69 | Temp 98.4°F | Ht 66.0 in | Wt 149.5 lb

## 2024-04-25 DIAGNOSIS — E118 Type 2 diabetes mellitus with unspecified complications: Secondary | ICD-10-CM | POA: Diagnosis not present

## 2024-04-25 DIAGNOSIS — G8929 Other chronic pain: Secondary | ICD-10-CM | POA: Diagnosis not present

## 2024-04-25 DIAGNOSIS — M546 Pain in thoracic spine: Secondary | ICD-10-CM

## 2024-04-25 DIAGNOSIS — I10 Essential (primary) hypertension: Secondary | ICD-10-CM

## 2024-04-25 DIAGNOSIS — R296 Repeated falls: Secondary | ICD-10-CM

## 2024-04-25 DIAGNOSIS — Z8673 Personal history of transient ischemic attack (TIA), and cerebral infarction without residual deficits: Secondary | ICD-10-CM | POA: Diagnosis not present

## 2024-04-25 DIAGNOSIS — Z23 Encounter for immunization: Secondary | ICD-10-CM

## 2024-04-25 DIAGNOSIS — E7800 Pure hypercholesterolemia, unspecified: Secondary | ICD-10-CM | POA: Diagnosis not present

## 2024-04-25 DIAGNOSIS — K219 Gastro-esophageal reflux disease without esophagitis: Secondary | ICD-10-CM

## 2024-04-25 DIAGNOSIS — Z9181 History of falling: Secondary | ICD-10-CM | POA: Diagnosis not present

## 2024-04-25 DIAGNOSIS — F331 Major depressive disorder, recurrent, moderate: Secondary | ICD-10-CM | POA: Diagnosis not present

## 2024-04-25 DIAGNOSIS — W19XXXD Unspecified fall, subsequent encounter: Secondary | ICD-10-CM

## 2024-04-25 DIAGNOSIS — E559 Vitamin D deficiency, unspecified: Secondary | ICD-10-CM | POA: Diagnosis not present

## 2024-04-25 NOTE — Progress Notes (Signed)
 Established patient visit   Patient: Leah Schaefer   DOB: 1941-06-30   83 y.o. Female  MRN: [id number] Visit Date: 04/25/2024  Today's healthcare provider: Rockie Agent, MD   Chief Complaint  Patient presents with   Medical Management of Chronic Issues    Patient is present for 6 month follow up, reports feeling well no acute concerns today   Subjective     HPI     Medical Management of Chronic Issues    Additional comments: Patient is present for 6 month follow up, reports feeling well no acute concerns today      Last edited by Cherry Chiquita HERO, CMA on 04/25/2024  1:54 PM.       Discussed the use of AI scribe software for clinical note transcription with the patient, who gave verbal consent to proceed.  History of Present Illness Leah Schaefer is an 83 year old female with a history of stroke and vascular dementia who presents for chronic condition follow-up.  She is here for follow-up on her chronic conditions, including reflux, hypertension, type two diabetes, and depression. Her mood has improved with her current medication regimen, which includes Abilify , Wellbutrin , and sertraline . She continues to follow up with psychiatry as scheduled.  She experiences issues with balance and falls, having had several falls in May, July, and recently, often due to obstacles or misjudging distances. Her daughter notes impaired peripheral and depth perception, which Dr. Loreli previously attributed to her past stroke. She has a history of vertigo, but her daughter clarifies that the falls are not related to vertigo.  She has difficulty with physical activity and has not been consistent with her physical therapy exercises. Her daughter notes struggles with sitting up from the sofa and loss of strength due to inactivity. She previously completed physical and occupational therapy but has not maintained the exercises recently.  Her current medications include Lipitor 40 mg  for cholesterol, Wellbutrin  300 mg, vitamin D  and calcium  supplements, Plavix  75 mg for stroke prevention, B12 500 mcg, and gabapentin  100 mg twice a day for pain management. She has stopped taking Aricept  due to hallucinations. She occasionally uses tramadol  for back pain but has only taken it twice since it was prescribed.  She experiences some pain that comes and goes but does not consistently use pain medication. No shortness of breath during walking, but she acknowledges some pain. Her daughter assists with bathing using a shower chair, but she is independent in dressing and eating.     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 Chemotherapy/post wide excision whole breast radiation.   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    ARIPiprazole  (ABILIFY ) 5 MG tablet Take 1 tablet (5 mg total) by mouth daily.   atorvastatin  (LIPITOR) 40 MG tablet Take 1 tablet (40 mg total) by mouth every evening.   buPROPion  (WELLBUTRIN  XL) 300 MG 24 hr tablet Take 1 tablet (300 mg total) by mouth daily.   Calcium  Carb-Cholecalciferol  600-800 MG-UNIT TABS Take 1 tablet by mouth daily.   cholecalciferol  (VITAMIN D ) 1000 units tablet Take 1,000 Units by mouth daily.   clopidogrel  (PLAVIX ) 75 MG tablet Take 1 tablet (75 mg total) by mouth daily.   cyanocobalamin  (VITAMIN B12) 500 MCG tablet Take 1 tablet (500 mcg total) by mouth daily.   fluticasone  (FLONASE ) 50 MCG/ACT nasal spray Place 2 sprays into both nostrils daily.   gabapentin  (NEURONTIN ) 100 MG capsule Take 1 capsule (100 mg total) by mouth 2 (two) times daily.   glucose blood (ONETOUCH VERIO) test strip Use as instructed to test blood sugar twice daily   Lancets (ONETOUCH DELICA PLUS LANCET33G) MISC Use as instructed to test blood sugar twice daily   latanoprost  (XALATAN ) 0.005 % ophthalmic solution Place 1 drop into both eyes at bedtime.    losartan  (COZAAR ) 50 MG tablet Take 1 tablet (50 mg total) by mouth daily.   metFORMIN  (GLUCOPHAGE ) 1000 MG tablet TAKE 1 TABLET TWICE DAILY WITH MEALS   metoprolol  tartrate (LOPRESSOR ) 25 MG tablet Take 0.5 tablets (12.5 mg total) by mouth 2 (two) times daily.   Multiple Vitamin (MULTIVITAMIN WITH MINERALS) TABS tablet Take 1 tablet by mouth daily.   ondansetron  (ZOFRAN -ODT) 8 MG disintegrating tablet Take by mouth as needed.   pantoprazole  (PROTONIX ) 40 MG tablet Take 1 tablet (40 mg total) by mouth 2 (two) times daily.   pyridoxine  (B-6) 100 MG tablet Take 100 mg by mouth daily.   sertraline  (ZOLOFT ) 100 MG tablet Take 1.5 tablets (150 mg total) by mouth daily.   traMADol  (ULTRAM ) 50 MG tablet Take 50 mg  by mouth at bedtime as needed.   traZODone  (DESYREL ) 50 MG tablet Take 0.5 tablets (25 mg total) by mouth at bedtime as needed for sleep.   vitamin C (ASCORBIC ACID ) 500 MG tablet Take 500 mg by mouth daily.   vitamin E  400 UNIT capsule Take 400 Units by mouth daily.   [DISCONTINUED] donepezil  (ARICEPT ) 5 MG tablet Take 1 tablet (5 mg total) by mouth at bedtime.   [DISCONTINUED] ARIPiprazole  (ABILIFY ) 2 MG tablet Take 1 tablet (2 mg total) by mouth at bedtime.   No facility-administered medications prior to visit.    Review of Systems  Last CBC Lab Results  Component Value Date   WBC 4.4 01/23/2024   HGB 13.4 01/23/2024   HCT 40.8 01/23/2024   MCV 90.3 01/23/2024   MCH 29.6 01/23/2024   RDW 12.9 01/23/2024  PLT 191 01/23/2024   Last metabolic panel Lab Results  Component Value Date   GLUCOSE 65 (L) 04/25/2024   NA 141 04/25/2024   K 4.3 04/25/2024   CL 103 04/25/2024   CO2 24 04/25/2024   BUN 18 04/25/2024   CREATININE 1.00 04/25/2024   GFRNONAA 59 (L) 01/23/2024   CALCIUM  10.1 04/25/2024   PROT 7.0 04/25/2024   ALBUMIN 4.4 04/25/2024   LABGLOB 2.6 04/25/2024   AGRATIO 2.0 08/23/2022   BILITOT 0.2 04/25/2024   ALKPHOS 78 04/25/2024   AST 18 04/25/2024   ALT 14 04/25/2024   ANIONGAP 7 01/23/2024   Last lipids Lab Results  Component Value Date   CHOL 110 10/14/2021   HDL 33 (L) 10/14/2021   LDLCALC 51 10/14/2021   TRIG 151 (H) 10/14/2021   CHOLHDL 3.3 10/14/2021   Last hemoglobin A1c Lab Results  Component Value Date   HGBA1C WILL FOLLOW 04/25/2024   Last thyroid  functions Lab Results  Component Value Date   TSH 2.583 06/25/2023   Last vitamin D  Lab Results  Component Value Date   VD25OH WILL FOLLOW 04/25/2024   Last vitamin B12 and Folate Lab Results  Component Value Date   VITAMINB12 >2000 (H) 04/25/2024   FOLATE 24.0 06/25/2023        Objective    BP 132/77 (BP Location: Right Arm, Patient Position: Sitting, Cuff Size: Normal)   Pulse  69   Temp 98.4 F (36.9 C) (Oral)   Ht 5' 6 (1.676 m)   Wt 149 lb 8 oz (67.8 kg)   SpO2 99%   BMI 24.13 kg/m  BP Readings from Last 3 Encounters:  04/25/24 132/77  01/23/24 (!) 141/78  10/26/23 126/66   Wt Readings from Last 3 Encounters:  04/25/24 149 lb 8 oz (67.8 kg)  01/23/24 148 lb 3.2 oz (67.2 kg)  10/26/23 153 lb (69.4 kg)        Physical Exam Vitals reviewed.  Constitutional:      General: She is not in acute distress.    Appearance: Normal appearance. She is not ill-appearing.  Cardiovascular:     Rate and Rhythm: Normal rate and regular rhythm.  Pulmonary:     Effort: Pulmonary effort is normal. No respiratory distress.     Breath sounds: No wheezing, rhonchi or rales.  Neurological:     Mental Status: She is alert and oriented to person, place, and time.  Psychiatric:        Mood and Affect: Mood normal.        Behavior: Behavior normal.     Physical Exam     Results for orders placed or performed in visit on 04/25/24  HgB A1c  Result Value Ref Range   Hgb A1c MFr Bld WILL FOLLOW    Est. average glucose Bld gHb Est-mCnc WILL FOLLOW   Urine Albumin/Creatinine with ratio (send out) [LAB689]  Result Value Ref Range   Creatinine, Urine WILL FOLLOW    Microalbumin, Urine WILL FOLLOW    Microalb/Creat Ratio WILL FOLLOW   VITAMIN D  25 Hydroxy (Vit-D Deficiency, Fractures)  Result Value Ref Range   Vit D, 25-Hydroxy WILL FOLLOW   Vitamin B12  Result Value Ref Range   Vitamin B-12 >2000 (H) 232 - 1245 pg/mL  CMP14+EGFR  Result Value Ref Range   Glucose 65 (L) 70 - 99 mg/dL   BUN 18 8 - 27 mg/dL   Creatinine, Ser 8.99 0.57 - 1.00 mg/dL   eGFR 56 (L) >  59 mL/min/1.73   BUN/Creatinine Ratio 18 12 - 28   Sodium 141 134 - 144 mmol/L   Potassium 4.3 3.5 - 5.2 mmol/L   Chloride 103 96 - 106 mmol/L   CO2 24 20 - 29 mmol/L   Calcium  10.1 8.7 - 10.3 mg/dL   Total Protein 7.0 6.0 - 8.5 g/dL   Albumin 4.4 3.7 - 4.7 g/dL   Globulin, Total 2.6 1.5 - 4.5  g/dL   Bilirubin Total 0.2 0.0 - 1.2 mg/dL   Alkaline Phosphatase 78 48 - 129 IU/L   AST 18 0 - 40 IU/L   ALT 14 0 - 32 IU/L    Assessment & Plan     Problem List Items Addressed This Visit     Acid reflux    Gastroesophageal reflux disease Chronic  Continue protonix  40mg  twice daily       Relevant Orders   Vitamin B12 (Completed)   Chronic bilateral thoracic back pain   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 Pain is intermittent and not severe enough to require regular tramadol  use. - continue tramadol  50mg  at bedtime as needed  -continue tylenol  PRN  - continue gabapentin  100mg  two times daily PRN       Episode of recurrent major depressive disorder (HCC)   Chronic depression Chronic depression, well-managed with medication regimen including Abilify , Wellbutrin , and sertraline . Regular follow-up with psychiatry is ongoing. - f/u as scheduled with psychiatry  -continue wellbutrin  3400mg  daily  -continue 150mg  of Zoloft  as scheduled  -continue Abilify  5mg  daily for mood stabilizer       Essential (primary) hypertension   Chronic condition  Essential hypertension, well-controlled with medication. Recent blood pressure reading was 132/77 mmHg. -continue Losartan  50mg  daily  -continue metoprolol  12.5mg  twice daily       Relevant Orders   CMP14+EGFR (Completed)   Fall from standing - Primary   Relevant Orders   Home Health   Face-to-face encounter (required for Medicare/Medicaid patients)   Frequent falls   Impaired mobility and recurrent falls Recurrent falls due to impaired mobility, likely exacerbated by peripheral and depth perception issues post-stroke. Recent falls include incidents in May, July, and last week, often due to environmental obstacles and lack of use of assistive devices. - Reinforce the importance of using the walker at all times for mobility. - Reinforce  the importance of removing environmental obstacles to prevent falls. - Reinforce the importance of adhering to a schedule of walking at 11 AM, 3 PM, and 7 PM daily. - Reinforce the importance of performing physical therapy and occupational therapy exercises regularly. - Refer to home health for physical therapy and occupational therapy to improve strength and balance.      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 Previous medication (Aricept ) was discontinued due to hallucinations, and a new medication regimen is in place. - continue neurology follow up as scheduled       Relevant Orders   Home Health   Face-to-face encounter (required for Medicare/Medicaid patients)   Hypercholesteremia   HLD Chronic  Continue lipitor 40mg  daily      Type 2 diabetes mellitus with complication, without long-term current use of insulin  (HCC)    Type 2 diabetes mellitus, Chronic condition  Type 2 diabetes mellitus,  currently managed with medication. - Order A1c test to monitor glycemic control. -continue metformin  1000mg  twice daily  -continue lipitor 40mg  daily       Relevant Orders   HgB A1c (Completed)   Urine Albumin/Creatinine with ratio (send out) [LAB689] (Completed)   Home Health   Face-to-face encounter (required for Medicare/Medicaid patients)   Other Visit Diagnoses       Immunization due       Relevant Orders   Flu vaccine HIGH DOSE PF(Fluzone Trivalent) (Completed)     Vitamin D  deficiency       Relevant Orders   VITAMIN D  25 Hydroxy (Vit-D Deficiency, Fractures) (Completed)       Assessment and Plan Assessment & Plan          Influenza vaccine was administered today  Patient tolerated well      Return in about 4 months (around 08/25/2024) for Chronic F/U.          Rockie Agent, MD  [facility] [phone] (phone) [phone] (fax)  [facility]

## 2024-04-25 NOTE — Patient Instructions (Signed)
 Please visit your local pharmacy for your Tdap (Tetanus) and shingles vaccines.

## 2024-04-27 LAB — MICROALBUMIN / CREATININE URINE RATIO

## 2024-04-27 NOTE — Assessment & Plan Note (Signed)
 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 Pain is intermittent and not severe enough to require regular tramadol  use. - continue tramadol  50mg  at bedtime as needed  -continue tylenol  PRN  - continue gabapentin  100mg  two times daily PRN

## 2024-04-27 NOTE — Assessment & Plan Note (Signed)
 Impaired mobility and recurrent falls Recurrent falls due to impaired mobility, likely exacerbated by peripheral and depth perception issues post-stroke. Recent falls include incidents in May, July, and last week, often due to environmental obstacles and lack of use of assistive devices. - Reinforce the importance of using the walker at all times for mobility. - Reinforce the importance of removing environmental obstacles to prevent falls. - Reinforce the importance of adhering to a schedule of walking at 11 AM, 3 PM, and 7 PM daily. - Reinforce the importance of performing physical therapy and occupational therapy exercises regularly. - Refer to home health for physical therapy and occupational therapy to improve strength and balance.

## 2024-04-27 NOTE — Assessment & Plan Note (Signed)
 Chronic condition  Essential hypertension, well-controlled with medication. Recent blood pressure reading was 132/77 mmHg. -continue Losartan  50mg  daily  -continue metoprolol  12.5mg  twice daily

## 2024-04-27 NOTE — Assessment & Plan Note (Signed)
  Gastroesophageal reflux disease Chronic  Continue protonix  40mg  twice daily

## 2024-04-27 NOTE — Assessment & Plan Note (Signed)
 Chronic depression Chronic depression, well-managed with medication regimen including Abilify , Wellbutrin , and sertraline . Regular follow-up with psychiatry is ongoing. - f/u as scheduled with psychiatry  -continue wellbutrin  3400mg  daily  -continue 150mg  of Zoloft  as scheduled  -continue Abilify  5mg  daily for mood stabilizer

## 2024-04-27 NOTE — Assessment & Plan Note (Signed)
 HLD Chronic  Continue lipitor 40mg  daily

## 2024-04-27 NOTE — Assessment & Plan Note (Signed)
  Type 2 diabetes mellitus, Chronic condition  Type 2 diabetes mellitus, currently managed with medication. - Order A1c test to monitor glycemic control. -continue metformin  1000mg  twice daily  -continue lipitor 40mg  daily

## 2024-04-27 NOTE — Assessment & Plan Note (Addendum)
 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 Previous medication (Aricept ) was discontinued due to hallucinations, and a new medication regimen is in place. - continue neurology follow up as scheduled

## 2024-04-28 LAB — CMP14+EGFR
ALT: 14 IU/L (ref 0–32)
AST: 18 IU/L (ref 0–40)
Albumin: 4.4 g/dL (ref 3.7–4.7)
Alkaline Phosphatase: 78 IU/L (ref 48–129)
BUN/Creatinine Ratio: 18 (ref 12–28)
BUN: 18 mg/dL (ref 8–27)
Bilirubin Total: 0.2 mg/dL (ref 0.0–1.2)
CO2: 24 mmol/L (ref 20–29)
Calcium: 10.1 mg/dL (ref 8.7–10.3)
Chloride: 103 mmol/L (ref 96–106)
Creatinine, Ser: 1 mg/dL (ref 0.57–1.00)
Globulin, Total: 2.6 g/dL (ref 1.5–4.5)
Glucose: 65 mg/dL — ABNORMAL LOW (ref 70–99)
Potassium: 4.3 mmol/L (ref 3.5–5.2)
Sodium: 141 mmol/L (ref 134–144)
Total Protein: 7 g/dL (ref 6.0–8.5)
eGFR: 56 mL/min/1.73 — ABNORMAL LOW (ref >59)

## 2024-04-28 LAB — HEMOGLOBIN A1C
Est. average glucose Bld gHb Est-mCnc: 131 mg/dL
Hgb A1c MFr Bld: 6.2 % — AB (ref 4.8–5.6)

## 2024-04-28 LAB — MICROALBUMIN / CREATININE URINE RATIO

## 2024-04-28 LAB — VITAMIN D 25 HYDROXY (VIT D DEFICIENCY, FRACTURES): Vit D, 25-Hydroxy: 45.4 ng/mL (ref 30.0–100.0)

## 2024-04-28 LAB — VITAMIN B12: Vitamin B-12: >2000 pg/mL — ABNORMAL HIGH (ref 232–1245)

## 2024-04-29 ENCOUNTER — Ambulatory Visit: Payer: Self-pay | Admitting: Family Medicine

## 2024-06-01 NOTE — Progress Notes (Unsigned)
 [facility] MD/PA/NP OP Progress Note  06/05/2024 5:34 PM Leah Schaefer  MRN:  [id number]  Chief Complaint:  Chief Complaint  Patient presents with   Follow-up   HPI:  This is a follow-up appointment for depression, compulsive skin picking, neurocognitive disorder and insomnia.  She states that her mood is better.  However, she feels tired and she is not doing any exercise. She states that she will try exercise again (according to [facility], her daughter, Suzi may not proceed with what she said at the appointments).  She sleeps well.  She has good appetite.  She denies SI, HI.  She has AH of people, although she denies CAH.  She denies VH.   Leah Schaefer, her daughter presents to the visit.  She states that she is not doing the exercise which she learned through PT and OT.  Although she used to be able to sit up from the sofa, she could not do it anymore.  She has been taking naps longer.  She reports frustration and concern of her not being engaged in therapy due to lack of motivation.  She thinks this has worsened since around the end of summer.  She is not doing skin picking is much, although it is still going.  She is not aware of much hallucinations since being on Abilify .  Although she did not exhibit paranoia, she approached her with concern, questioning whether something unusual was occurring when she heard the sound of keys at the door.  She had 1 fall when she was sitting on the bedside pot.  She agrees with the plans as outlined below.  Both of them agreed with the plans as outlined below.     Wt Readings from Last 3 Encounters:  06/05/24 142 lb (64.4 kg)  04/25/24 149 lb 8 oz (67.8 kg)  04/03/24 150 lb 3.2 oz (68.1 kg)     Visit Diagnosis:    ICD-10-CM   1. MDD (major depressive disorder), recurrent episode, mild  F33.0     2. Compulsive skin picking  F42.4     3. Mild neurocognitive disorder  G31.84     4. Insomnia, unspecified type  G47.00       Past Psychiatric History:  Please see initial evaluation for full details. I have reviewed the history. No updates at this time.     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 Chemotherapy/post wide excision whole breast radiation.   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    Personal history of chemotherapy  2013   LEFT BREAST   Personal history of malignant neoplasm of breast    Personal history of radiation therapy 2017    Rt, 2013 had on left   Stroke (HCC) 09/06/2020   1 cm acute infarction in the right parietal deep and subcortical white matter   Vertigo     Past Surgical History:  Procedure Laterality Date   BREAST BIOPSY Left 07/18/2011   +   BREAST BIOPSY Left 09/21/2015   neg   BREAST BIOPSY Left 04/05/2016   neg   BREAST BIOPSY Left 03/20/2019   us  bx 2 areas /fat necrosis at 9:30 ribbon and 5:30 coil   BREAST EXCISIONAL BIOPSY Right 08/2015   [facility]   BREAST LUMPECTOMY Right 09/30/2015   [facility] WITH MICROPAPILLARY FEATURES AND DCIS/CLEAR MARGINS, NEGATIVE LN   BREAST LUMPECTOMY Left 08/17/2011   BREAST LUMPECTOMY WITH SENTINEL LYMPH NODE BIOPSY Right 09/30/2015    Procedure: BREAST LUMPECTOMY WITH SENTINEL LYMPH NODE BX;  Surgeon: Reyes LELON Cota, MD;  Location: [facility] ORS;  Service: General;  Laterality: Right;   BREAST SURGERY Left 2012   wide excision   BREAST SURGERY Left November 2013   Core biopsy of the upper-outer quadrant showed fat necrosis.   CHOLECYSTECTOMY  2007   COLONOSCOPY  2011   Dr Ora   COLONOSCOPY WITH PROPOFOL  N/A 02/25/2015   Procedure: COLONOSCOPY WITH PROPOFOL ;  Surgeon: Deward CINDERELLA Ora, MD;  Location: [facility] ENDOSCOPY;  Service: Gastroenterology;  Laterality: N/A;   CYSTOSCOPY WITH STENT PLACEMENT Left 11/05/2020   Procedure: CYSTOSCOPY WITH STENT PLACEMENT;  Surgeon: Twylla Glendia BROCKS, MD;  Location: [facility] ORS;  Service: Urology;  Laterality: Left;   CYSTOSCOPY/URETEROSCOPY/HOLMIUM LASER/STENT PLACEMENT Left 12/07/2020   Procedure: CYSTOSCOPY/URETEROSCOPY/HOLMIUM LASER/STENT PLACEMENT;  Surgeon: Twylla Glendia BROCKS, MD;  Location: [facility] ORS;  Service: Urology;  Laterality: Left;   DILATION AND CURETTAGE OF UTERUS  2004   HERNIA REPAIR Right 09/10/2007   Umbilical/ventral hernia repaired with 6.4 cm Proceed ventral patch   PORT-A-CATH REMOVAL     PORTACATH PLACEMENT  2013   RECURRENT HERNIA N/A 01/07/2008   Recurrent ventral hernia at the umbilicus, laparoscopy, open placement of a large Ultra Pro mesh with trans-fascial sutures.   UPPER GI ENDOSCOPY  2011    Family Psychiatric History: Please see initial evaluation for full details. I have reviewed the history. No updates at this time.     Family History:  Family History  Problem Relation Age of Onset   Lymphoma Father    Ovarian cancer Sister    Colon cancer Brother    Lymphoma Brother    Cancer Other        stomach   Cancer Other        stomach   Breast cancer Neg Hx     Social History:  Social History   Socioeconomic History   Marital status: Widowed    Spouse name: Not on file   Number of children: 2   Years of education: H/S   Highest education level: 12th grade   Occupational History   Occupation: Retired  Tobacco Use   Smoking status: Never   Smokeless tobacco: Never  Vaping Use   Vaping status: Never Used  Substance and Sexual Activity   Alcohol  use: No   Drug use: No   Sexual activity: Not Currently  Other Topics Concern   Not on file  Social History Narrative   Lives alone   Social Drivers of Health   Financial Resource Strain: Low  Risk  (03/19/2024)   Overall Financial Resource Strain (CARDIA)    Difficulty of Paying Living Expenses: Not hard at all  Food Insecurity: No Food Insecurity (03/19/2024)   Hunger Vital Sign    Worried About Running Out of Food in the Last Year: Never true    Ran Out of Food in the Last Year: Never true  Transportation Needs: No Transportation Needs (03/19/2024)   PRAPARE - Administrator, Civil Service (Medical): No    Lack of Transportation (Non-Medical): No  Physical Activity: Insufficiently Active (03/19/2024)   Exercise Vital Sign    Days of Exercise per Week: 2 days    Minutes of Exercise per Session: 30 min  Stress: No Stress Concern Present (03/19/2024)   [employer] of Occupational Health - Occupational Stress Questionnaire    Feeling of Stress: Not at all  Social Connections: Unknown (03/19/2024)   Social Connection and Isolation Panel    Frequency of Communication with Friends and Family: Twice a week    Frequency of Social Gatherings with Friends and Family: Once a week    Attends Religious Services: Not on Insurance Claims Handler of Clubs or Organizations: No    Attends Banker Meetings: Never    Marital Status: Widowed    Allergies: No Known Allergies  Metabolic Disorder Labs: Lab Results  Component Value Date   HGBA1C 6.2 (H) 04/25/2024   MPG 171.42 09/07/2020   MPG 157.07 11/07/2018   No results found for: PROLACTIN Lab Results  Component Value Date   CHOL 110 10/14/2021   TRIG 151 (H) 10/14/2021   HDL 33 (L) 10/14/2021   CHOLHDL 3.3  10/14/2021   VLDL 18 09/07/2020   LDLCALC 51 10/14/2021   LDLCALC 57 09/07/2020   Lab Results  Component Value Date   TSH 2.583 06/25/2023   TSH 2.110 10/14/2021    Therapeutic Level Labs: No results found for: LITHIUM No results found for: VALPROATE No results found for: CBMZ  Current Medications: Current Outpatient Medications  Medication Sig Dispense Refill   acetaminophen  (TYLENOL ) 500 MG tablet Take 500 mg by mouth every 6 (six) hours as needed for mild pain or headache.      atorvastatin  (LIPITOR) 40 MG tablet Take 1 tablet (40 mg total) by mouth every evening. 90 tablet 3   buPROPion  (WELLBUTRIN  XL) 300 MG 24 hr tablet Take 1 tablet (300 mg total) by mouth daily. 90 tablet 1   Calcium  Carb-Cholecalciferol  600-800 MG-UNIT TABS Take 1 tablet by mouth daily.     cholecalciferol  (VITAMIN D ) 1000 units tablet Take 1,000 Units by mouth daily.     clopidogrel  (PLAVIX ) 75 MG tablet Take 1 tablet (75 mg total) by mouth daily. 90 tablet 2   cyanocobalamin  (VITAMIN B12) 500 MCG tablet Take 1 tablet (500 mcg total) by mouth daily. 30 tablet 3   fluticasone  (FLONASE ) 50 MCG/ACT nasal spray Place 2 sprays into both nostrils daily. 16 g 6   gabapentin  (NEURONTIN ) 100 MG capsule Take 1 capsule (100 mg total) by mouth 2 (two) times daily. 60 capsule 1   glucose blood (ONETOUCH VERIO) test strip Use as instructed to test blood sugar twice daily 100 strip 0   Lancets (ONETOUCH DELICA PLUS LANCET33G) MISC Use as instructed to test blood sugar twice daily 100 each 0   latanoprost  (XALATAN ) 0.005 % ophthalmic solution Place 1 drop into both eyes at bedtime.      losartan  (COZAAR ) 50 MG tablet Take  1 tablet (50 mg total) by mouth daily. 90 tablet 3   metFORMIN  (GLUCOPHAGE ) 1000 MG tablet TAKE 1 TABLET TWICE DAILY WITH MEALS 180 tablet 3   metoprolol  tartrate (LOPRESSOR ) 25 MG tablet Take 0.5 tablets (12.5 mg total) by mouth 2 (two) times daily. 90 tablet 2   Multiple Vitamin (MULTIVITAMIN  WITH MINERALS) TABS tablet Take 1 tablet by mouth daily.     ondansetron  (ZOFRAN -ODT) 8 MG disintegrating tablet Take by mouth as needed.     pantoprazole  (PROTONIX ) 40 MG tablet Take 1 tablet (40 mg total) by mouth 2 (two) times daily. 180 tablet 3   pyridoxine  (B-6) 100 MG tablet Take 100 mg by mouth daily.     sertraline  (ZOLOFT ) 100 MG tablet Take 1.5 tablets (150 mg total) by mouth daily. 135 tablet 1   traMADol  (ULTRAM ) 50 MG tablet Take 50 mg by mouth at bedtime as needed.     traZODone  (DESYREL ) 50 MG tablet Take 0.5 tablets (25 mg total) by mouth at bedtime as needed for sleep. 45 tablet 1   vitamin C (ASCORBIC ACID ) 500 MG tablet Take 500 mg by mouth daily.     vitamin E  400 UNIT capsule Take 400 Units by mouth daily.     [START ON 07/06/2024] ARIPiprazole  (ABILIFY ) 5 MG tablet Take 1 tablet (5 mg total) by mouth daily. 90 tablet 0   No current facility-administered medications for this visit.     Musculoskeletal: Strength & Muscle Tone: within normal limits Gait & Station: uses a walker Patient leans: N/A  Psychiatric Specialty Exam: Review of Systems  Psychiatric/Behavioral:  Positive for behavioral problems. Negative for agitation, confusion, decreased concentration, dysphoric mood, hallucinations, self-injury, sleep disturbance and suicidal ideas. The patient is not nervous/anxious and is not hyperactive.   All other systems reviewed and are negative.   Blood pressure 126/72, pulse 78, temperature 98.2 F (36.8 C), temperature source Temporal, height 5' 6 (1.676 m), weight 142 lb (64.4 kg), SpO2 100%.Body mass index is 22.92 kg/m.  General Appearance: Well Groomed  Eye Contact:  Good  Speech:  Clear and Coherent  Volume:  Normal  Mood:  good  Affect:  Blunt  Thought Process:  Coherent  Orientation:  Full (Time, Place, and Person)  Thought Content: Logical   Suicidal Thoughts:  No  Homicidal Thoughts:  No  Memory:  Immediate;   Good  Judgement:  Good  Insight:   Fair  Psychomotor Activity:  Normal, Normal tone, no rigidity, no resting/postural tremors, no tardive dyskinesia    Concentration:  Concentration: Good and Attention Span: Good  Recall:  Poor  Fund of Knowledge: Good  Language: Good  Akathisia:  No  Handed:  Right  AIMS (if indicated): 0   Assets:  Social Support  ADL's:  Impaired  Cognition: Impaired,  Mild  Sleep:  Good   Screenings: GAD-7    Garment/textile Technologist Visit from 06/05/2024 in [facility] Office Visit from 04/25/2024 in [facility] Office Visit from 10/26/2023 in [facility] Office Visit from 07/20/2023 in [facility] Office Visit from 06/08/2023 in [facility]  Total GAD-7 Score 7 5 5 9 7    PHQ2-9    Flowsheet Row Office Visit from 06/05/2024 in [facility] Office Visit from 04/25/2024 in [facility] Clinical Support from 03/19/2024 in [facility] Office Visit from 10/26/2023 in  [facility] Office Visit from 07/20/2023 in [facility]  PHQ-2 Total Score 4 2 0 3 4  PHQ-9 Total Score 8 7 0 8 16   Flowsheet Row Office Visit from 06/05/2024 in [facility] ED from 12/15/2022 in [facility] Department at [facility] ED from 11/28/2022 in [facility] Department at [facility]  C-SSRS RISK CATEGORY No Risk No Risk No Risk     Assessment and Plan:  ALVITA FANA is a 83 y.o. year old female with a history of depression, mixed dementia, frequent fall, history of 1 cm acute infarction in the right parietal deep and subcortical white matter causing left leg motor planning deficit, SVD, neuropathy, h/o breast cancer s/p chemotherapy, radiation, who is referred for  depression.   1. MDD (major depressive disorder), recurrent episode, in partial remission Other stressors include:  loss of her husband in 2020, s/p stroke. chemotherapy History: reportedly has a history of not telling the truth to provider, either to minimize, or  falsely complaining symptoms She denies any depressive symptoms except what sounds to be abulia.  Will continue current medication regimen while working on behavioral activation.  Will continue sertraline  for depression, and bupropion  and Abilify  as adjunctive treatment for depression.   2. Compulsive skin picking Although she still has some skin picking, it has been overall manageable since being on Abilify .  Will continue current dose.   3. Mild neurocognitive disorder Functional Status   IADL: Independent in the following:            Requires assistance with the following: managing finances, medications, driving ADL  Independent in the following: bathing and hygiene, continence, toileting, walking (walker)          Requires assistance with the following: grooming, feeding,  Folate, Vitamin B12, TSH wnl 06/2023 Images Neuropsych assessment:  Etiology: vascular   NPS- VH, sundowning- improving - hold on donepezil , seen by Dr. Maree The exam is notable for calmer demeanor. According to the caregiver, there has been improvement in hallucinations since uptitration of Abilify .   Will continue the current dose of Abilifyto target behavior issues associated with dementia and hallucinations.  Discussed potential metabolic side effect, EPS, QTc prolongation and increased mortality for people with dementia. Noted that Although there is concern of abulia, her daughter is not interested in medication adjustment, and prefers to work on behavioral activation.  It is also noted that abulia has slightly worsened in association with seasonal changes. Use of a lightbox was discussed as a potential intervention.  4. Insomnia, unspecified  type Overall stable.  Will continue current dose of trazodone  as needed for insomnia.     Plan Continue sertraline  150 mg at night Continue bupropion  300 mg daily  Continue Abilify  5 mg daily  Continue trazodone  25 mg at night as needed for insomnia Next appointment: 1/29 at 3 pm, IP Consider light box The patient demonstrates the following risk factors for suicide: Chronic risk factors for suicide include: psychiatric disorder of depression . Acute risk factors for suicide include: unemployment and loss (financial, interpersonal, professional). Protective factors for this patient include: positive social support and hope for the future. Considering these factors, the overall suicide risk at this point appears to be low. Patient is appropriate for outpatient follow up.     Collaboration of Care: Collaboration of Care: Other reviewed notes in Epic  Patient/Guardian was advised Release of Information must be obtained prior  to any record release in order to collaborate their care with an outside provider. Patient/Guardian was advised if they have not already done so to contact the registration department to sign all necessary forms in order for us  to release information regarding their care.   Consent: Patient/Guardian gives verbal consent for treatment and assignment of benefits for services provided during this visit. Patient/Guardian expressed understanding and agreed to proceed.    Katheren Sleet, MD 06/05/2024, 5:34 PM

## 2024-06-02 ENCOUNTER — Other Ambulatory Visit (HOSPITAL_BASED_OUTPATIENT_CLINIC_OR_DEPARTMENT_OTHER): Payer: Self-pay

## 2024-06-05 ENCOUNTER — Ambulatory Visit (INDEPENDENT_AMBULATORY_CARE_PROVIDER_SITE_OTHER): Admitting: Psychiatry

## 2024-06-05 ENCOUNTER — Other Ambulatory Visit: Payer: Self-pay

## 2024-06-05 ENCOUNTER — Encounter: Payer: Self-pay | Admitting: Psychiatry

## 2024-06-05 VITALS — BP 126/72 | HR 78 | Temp 98.2°F | Ht 66.0 in | Wt 142.0 lb

## 2024-06-05 DIAGNOSIS — G4700 Insomnia, unspecified: Secondary | ICD-10-CM

## 2024-06-05 DIAGNOSIS — F330 Major depressive disorder, recurrent, mild: Secondary | ICD-10-CM | POA: Diagnosis not present

## 2024-06-05 DIAGNOSIS — G3184 Mild cognitive impairment, so stated: Secondary | ICD-10-CM | POA: Diagnosis not present

## 2024-06-05 DIAGNOSIS — F424 Excoriation (skin-picking) disorder: Secondary | ICD-10-CM

## 2024-06-05 MED ORDER — ARIPIPRAZOLE 5 MG PO TABS: 5.0000 mg | ORAL_TABLET | Freq: Every day | ORAL | 0 refills | Status: AC

## 2024-06-05 NOTE — Patient Instructions (Addendum)
 Continue sertraline  150 mg at night Continue bupropion  300 mg daily  Continue Abilify  5 mg daily  Continue trazodone  25 mg at night as needed for insomnia Next appointment: 1/29 at 3 pm Consider light box  Generally, the light box should: Emit full-spectrum light with either fluorescent or LED bulbs (fluorescent is usually recommended) Provide an exposure to 10,000 lux of light Produce as little UV light as possible  Typical recommendations include using the light box: Within the 30 mins to first hour of waking up in the morning For about 20 to 30 minutes About 16 to 24 inches (41 to 61 centimeters) from your face, but follow the manufacturer's instructions about distance With eyes open, but not looking directly at the light  Noted that Light boxes aren't regulated by the Food and Drug Administration (FDA) for SAD (seasonal affective disorder) treatment.

## 2024-06-16 ENCOUNTER — Other Ambulatory Visit: Payer: Self-pay | Admitting: Psychiatry

## 2024-06-16 ENCOUNTER — Other Ambulatory Visit: Payer: Self-pay | Admitting: Family Medicine

## 2024-06-16 ENCOUNTER — Other Ambulatory Visit: Payer: Self-pay

## 2024-06-16 ENCOUNTER — Other Ambulatory Visit (HOSPITAL_COMMUNITY): Payer: Self-pay

## 2024-06-16 ENCOUNTER — Other Ambulatory Visit (HOSPITAL_BASED_OUTPATIENT_CLINIC_OR_DEPARTMENT_OTHER): Payer: Self-pay

## 2024-06-16 DIAGNOSIS — I10 Essential (primary) hypertension: Secondary | ICD-10-CM

## 2024-06-16 DIAGNOSIS — I6930 Unspecified sequelae of cerebral infarction: Secondary | ICD-10-CM

## 2024-06-16 MED ORDER — CLOPIDOGREL BISULFATE 75 MG PO TABS
75.0000 mg | ORAL_TABLET | Freq: Every day | ORAL | 2 refills | Status: AC
  Filled 2024-06-16: qty 90, 90d supply, fill #0
  Filled 2024-09-08: qty 90, 90d supply, fill #1

## 2024-06-16 MED ORDER — TRAZODONE HCL 50 MG PO TABS
25.0000 mg | ORAL_TABLET | Freq: Every evening | ORAL | 1 refills | Status: AC | PRN
  Filled 2024-06-16: qty 45, 90d supply, fill #0
  Filled 2024-09-08: qty 45, 90d supply, fill #1

## 2024-06-16 MED ORDER — METOPROLOL TARTRATE 25 MG PO TABS
12.5000 mg | ORAL_TABLET | Freq: Two times a day (BID) | ORAL | 2 refills | Status: AC
  Filled 2024-06-16: qty 90, 90d supply, fill #0
  Filled 2024-09-08: qty 90, 90d supply, fill #1

## 2024-06-17 ENCOUNTER — Other Ambulatory Visit: Payer: Self-pay

## 2024-06-18 ENCOUNTER — Other Ambulatory Visit (HOSPITAL_COMMUNITY): Payer: Self-pay

## 2024-06-20 ENCOUNTER — Other Ambulatory Visit: Payer: Self-pay

## 2024-06-23 ENCOUNTER — Other Ambulatory Visit (HOSPITAL_COMMUNITY): Payer: Self-pay

## 2024-06-23 ENCOUNTER — Other Ambulatory Visit: Payer: Self-pay

## 2024-06-24 ENCOUNTER — Telehealth: Payer: Self-pay

## 2024-06-24 ENCOUNTER — Other Ambulatory Visit: Payer: Self-pay

## 2024-06-24 NOTE — Progress Notes (Signed)
 Pharmacy Quality Measure Review  This patient is appearing on a report for being at risk of failing the adherence measure for cholesterol (statin) and hypertension (ACEi/ARB) medications this calendar year.   Medication: losartan  Last fill date: 03/25/24 for 90 day supply  Medication: atorvastatin  Last fill date: 03/25/24 for 90 day supply  Medications are currently awaiting shipment at [facility], however, pharmacy has been unable to reach patient to set up updated payment method. Attempted to contact patient, but voicemail box is not set up.  Emiel Kielty E. Marsh, PharmD, CPP Clinical Pharmacist [facility] [phone]

## 2024-07-04 ENCOUNTER — Other Ambulatory Visit (HOSPITAL_COMMUNITY): Payer: Self-pay

## 2024-07-08 ENCOUNTER — Other Ambulatory Visit: Payer: Self-pay | Admitting: *Deleted

## 2024-07-08 DIAGNOSIS — N201 Calculus of ureter: Secondary | ICD-10-CM

## 2024-07-15 ENCOUNTER — Ambulatory Visit: Admitting: Urology

## 2024-07-17 ENCOUNTER — Ambulatory Visit: Payer: Self-pay | Admitting: Urology

## 2024-07-23 ENCOUNTER — Encounter: Payer: Self-pay | Admitting: Urology

## 2024-07-23 ENCOUNTER — Ambulatory Visit
Admission: RE | Admit: 2024-07-23 | Discharge: 2024-07-23 | Disposition: A | Discharge status: Home / Self Care | Attending: Urology | Admitting: Urology

## 2024-07-23 ENCOUNTER — Ambulatory Visit
Admission: RE | Admit: 2024-07-23 | Discharge: 2024-07-23 | Disposition: A | Discharge status: Home / Self Care | Source: Ambulatory Visit | Attending: Urology | Admitting: Urology

## 2024-07-23 ENCOUNTER — Ambulatory Visit: Admitting: Urology

## 2024-07-23 VITALS — BP 128/84 | HR 73 | Ht 66.0 in | Wt 147.0 lb

## 2024-07-23 DIAGNOSIS — Z87442 Personal history of urinary calculi: Secondary | ICD-10-CM | POA: Diagnosis not present

## 2024-07-23 DIAGNOSIS — N201 Calculus of ureter: Secondary | ICD-10-CM | POA: Insufficient documentation

## 2024-07-23 NOTE — Progress Notes (Signed)
 07/23/2024 3:32 PM   Leah Schaefer Jul 08, 1941 [id number]  Referring provider: Sharma Coyer, MD [address] [place],  [place] [address]  Chief Complaint  Patient presents with   Nephrolithiasis   Urologic history 1.  History urinary calculi Urgent stent placement 11/06/2020 for 10 mm UPJ calculus with infection CT with additional lower pole calculi Ureteroscopy/laser lithotripsy of left renal calculi 12/07/2020 Stone analysis 100% carbonate apatite Metabolic evaluation with low urine volume 1.4 L and hypocitraturia CT abdomen pelvis 12/2022-no renal/ureteral calculi   HPI: Leah Schaefer is a 83 y.o. female.  Her daughter was with her today.  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 Chemotherapy/post wide excision whole breast radiation.   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    Personal history of chemotherapy 2013   LEFT BREAST   Personal history of malignant neoplasm of breast    Personal history of radiation therapy 2017     Rt, 2013 had on left   Stroke (HCC) 09/06/2020   1 cm acute infarction in the right parietal deep and subcortical white matter   Vertigo     Surgical History: Past Surgical History:  Procedure Laterality Date   BREAST BIOPSY Left 07/18/2011   +   BREAST BIOPSY Left 09/21/2015   neg   BREAST BIOPSY Left 04/05/2016   neg   BREAST BIOPSY Left 03/20/2019   us  bx 2 areas /fat necrosis at 9:30 ribbon and 5:30 coil   BREAST EXCISIONAL BIOPSY Right 08/2015   [facility]   BREAST LUMPECTOMY Right 09/30/2015   [facility] WITH MICROPAPILLARY FEATURES AND DCIS/CLEAR MARGINS, NEGATIVE LN   BREAST LUMPECTOMY Left 08/17/2011   BREAST LUMPECTOMY WITH SENTINEL LYMPH NODE BIOPSY Right 09/30/2015   Procedure: BREAST LUMPECTOMY WITH SENTINEL LYMPH NODE BX;  Surgeon: Reyes LELON Cota, MD;  Location: [facility];  Service: General;  Laterality: Right;   BREAST SURGERY Left 2012   wide excision   BREAST SURGERY Left November 2013   Core biopsy of the upper-outer quadrant showed fat necrosis.   CHOLECYSTECTOMY  2007   COLONOSCOPY  2011   Dr Ora   COLONOSCOPY WITH PROPOFOL  N/A 02/25/2015   Procedure: COLONOSCOPY WITH PROPOFOL ;  Surgeon: Deward CINDERELLA Ora, MD;  Location: [facility];  Service: Gastroenterology;  Laterality: N/A;   CYSTOSCOPY WITH STENT PLACEMENT Left 11/05/2020   Procedure: CYSTOSCOPY WITH STENT PLACEMENT;  Surgeon: Twylla Glendia BROCKS, MD;  Location:  [facility];  Service: Urology;  Laterality: Left;   CYSTOSCOPY/URETEROSCOPY/HOLMIUM LASER/STENT PLACEMENT Left 12/07/2020   Procedure: CYSTOSCOPY/URETEROSCOPY/HOLMIUM LASER/STENT PLACEMENT;  Surgeon: Twylla Glendia BROCKS, MD;  Location: [facility];  Service: Urology;  Laterality: Left;   DILATION AND CURETTAGE OF UTERUS  2004   HERNIA REPAIR Right 09/10/2007   Umbilical/ventral hernia repaired with 6.4 cm Proceed ventral patch   PORT-A-CATH REMOVAL     PORTACATH PLACEMENT  2013   RECURRENT HERNIA N/A 01/07/2008   Recurrent ventral hernia at the umbilicus, laparoscopy,  open placement of a large Ultra Pro mesh with trans-fascial sutures.   UPPER GI ENDOSCOPY  2011    Home Medications:  Allergies as of 07/23/2024   No Known Allergies      Medication List        Accurate as of July 23, 2024  3:32 PM. If you have any questions, ask your nurse or doctor.          acetaminophen  500 MG tablet Commonly known as: TYLENOL  Take 500 mg by mouth every 6 (six) hours as needed for mild pain or headache.   ARIPiprazole  5 MG tablet Commonly known as: ABILIFY  Take 1 tablet (5 mg total) by mouth daily.   ascorbic acid  500 MG tablet Commonly known as: VITAMIN C Take 500 mg by mouth daily.   atorvastatin  40 MG tablet Commonly known as: LIPITOR Take 1 tablet (40 mg total) by mouth every evening.   buPROPion  300 MG 24 hr tablet Commonly known as: WELLBUTRIN  XL Take 1 tablet (300 mg total) by mouth daily.   Calcium  Carb-Cholecalciferol  600-800 MG-UNIT Tabs Take 1 tablet by mouth daily.   cholecalciferol  1000 units tablet Commonly known as: VITAMIN D  Take 1,000 Units by mouth daily.   clopidogrel  75 MG tablet Commonly known as: PLAVIX  Take 1 tablet (75 mg total) by mouth daily.   fluticasone  50 MCG/ACT nasal spray Commonly known as: FLONASE  Place 2 sprays into both nostrils daily.   gabapentin  100 MG capsule Commonly known as: NEURONTIN  Take 1 capsule (100 mg total) by mouth 2 (two) times daily.   latanoprost  0.005 % ophthalmic solution Commonly known as: XALATAN  Place 1 drop into both eyes at bedtime.   losartan  50 MG tablet Commonly known as: COZAAR  Take 1 tablet (50 mg total) by mouth daily.   metFORMIN  1000 MG tablet Commonly known as: GLUCOPHAGE  TAKE 1 TABLET TWICE DAILY WITH MEALS   metoprolol  tartrate 25 MG tablet Commonly known as: LOPRESSOR  Take 0.5 tablets (12.5 mg total) by mouth 2 (two) times daily.   multivitamin with minerals Tabs tablet Take 1 tablet by mouth daily.   ondansetron  8 MG disintegrating  tablet Commonly known as: ZOFRAN -ODT Take by mouth as needed.   OneTouch Delica Plus Lancet33G Misc Use as instructed to test blood sugar twice daily   OneTouch Verio test strip Generic drug: glucose blood Use as instructed to test blood sugar twice daily   pantoprazole  40 MG tablet Commonly known as: PROTONIX  Take 1 tablet (40 mg total) by mouth 2 (two) times daily.   pyridoxine  100 MG tablet Commonly known as: B-6 Take 100 mg by mouth daily.   sertraline  100 MG tablet Commonly known as: ZOLOFT  Take 1.5 tablets (150 mg total) by mouth daily.   traMADol  50 MG tablet Commonly known as: ULTRAM  Take 50 mg by mouth at bedtime as needed.   traZODone  50 MG tablet Commonly known as: DESYREL  Take 0.5 tablets (25 mg total) by mouth at bedtime as needed for  sleep.   vitamin B-12 500 MCG tablet Commonly known as: CYANOCOBALAMIN  Take 1 tablet (500 mcg total) by mouth daily.   vitamin E  180 MG (400 UNITS) capsule Take 400 Units by mouth daily.        Allergies: Allergies[1]  Family History: Family History  Problem Relation Age of Onset   Lymphoma Father    Ovarian cancer Sister    Colon cancer Brother    Lymphoma Brother    Cancer Other        stomach   Cancer Other        stomach   Breast cancer Neg Hx     Social History:  reports that she has never smoked. She has never used smokeless tobacco. She reports that she does not drink alcohol and does not use drugs.   Physical Exam: BP 128/84   Pulse 73   Ht 5' 6 (1.676 m)   Wt 147 lb (66.7 kg)   BMI 23.73 kg/m   Constitutional:  Alert, No acute distress. HEENT: [place] AT Respiratory: Normal respiratory effort, no increased work of breathing. Psychiatric: Normal mood and affect.   Pertinent Imaging: KUB performed earlier today was personally reviewed and interpreted.  No calcifications suspicious for recurrent urinary tract stones are identified overlying the renal outlines and expected ureteral  course   Assessment & Plan:    1.  Personal history urinary calculi Negative KUB today Follow-up 2 years with KUB Call earlier for recurrent flank/abdominal pain   Glendia JAYSON Barba, MD  [facility] [address], [address] [place], [place] [address] [phone]     [1] No Known Allergies

## 2024-07-29 ENCOUNTER — Other Ambulatory Visit (HOSPITAL_COMMUNITY): Payer: Self-pay

## 2024-07-29 ENCOUNTER — Other Ambulatory Visit: Payer: Self-pay | Admitting: Family Medicine

## 2024-07-29 ENCOUNTER — Other Ambulatory Visit: Payer: Self-pay

## 2024-07-29 MED ORDER — GABAPENTIN 100 MG PO CAPS
100.0000 mg | ORAL_CAPSULE | Freq: Two times a day (BID) | ORAL | 1 refills | Status: AC
  Filled 2024-07-29: qty 60, 30d supply, fill #0
  Filled 2024-09-07: qty 60, 30d supply, fill #1

## 2024-08-06 ENCOUNTER — Other Ambulatory Visit: Payer: Self-pay | Admitting: Family Medicine

## 2024-08-06 DIAGNOSIS — H6992 Unspecified Eustachian tube disorder, left ear: Secondary | ICD-10-CM

## 2024-08-07 NOTE — Telephone Encounter (Signed)
 Requested Prescriptions  Pending Prescriptions Disp Refills   fluticasone  (FLONASE ) 50 MCG/ACT nasal spray [Pharmacy Med Name: Fluticasone  Propionate 50 MCG/ACT Nasal Suspension] 16 g 2    Sig: Use 2 spray(s) in each nostril once daily     Ear, Nose, and Throat: Nasal Preparations - Corticosteroids Passed - 08/07/2024  1:48 PM      Passed - Valid encounter within last 12 months    Recent Outpatient Visits           3 months ago Fall from standing, subsequent encounter   [facility], [employer], MD   9 months ago Mild episode of recurrent major depressive disorder   [facility], [place], MD       Future Appointments             In 1 year Stoioff, Glendia BROCKS, MD [facility] [place]

## 2024-08-29 ENCOUNTER — Ambulatory Visit: Admitting: Family Medicine

## 2024-08-31 NOTE — Progress Notes (Unsigned)
 [facility] MD/PA/NP OP Progress Note  08/31/2024 10:12 AM Leah Schaefer  MRN:  [id number]  Chief Complaint: No chief complaint on file.  HPI: *** Visit Diagnosis: No diagnosis found.  Past Psychiatric History: Please see initial evaluation for full details. I have reviewed the history. No updates at this time.     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 Chemotherapy/post wide excision whole breast radiation.   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    Personal history of chemotherapy 2013   LEFT BREAST   Personal history of malignant neoplasm of breast    Personal history of radiation therapy 2017    Rt, 2013 had on left   Stroke (HCC) 09/06/2020   1 cm acute infarction in the right parietal deep and subcortical white matter   Vertigo     Past Surgical History:  Procedure Laterality Date   BREAST BIOPSY Left 07/18/2011   +   BREAST BIOPSY Left 09/21/2015   neg   BREAST BIOPSY Left 04/05/2016   neg   BREAST BIOPSY Left 03/20/2019   us  bx 2 areas /fat necrosis at 9:30 ribbon and 5:30 coil   BREAST EXCISIONAL BIOPSY Right 08/2015   [facility]   BREAST  LUMPECTOMY Right 09/30/2015   [facility] WITH MICROPAPILLARY FEATURES AND DCIS/CLEAR MARGINS, NEGATIVE LN   BREAST LUMPECTOMY Left 08/17/2011   BREAST LUMPECTOMY WITH SENTINEL LYMPH NODE BIOPSY Right 09/30/2015   Procedure: BREAST LUMPECTOMY WITH SENTINEL LYMPH NODE BX;  Surgeon: Reyes LELON Cota, MD;  Location: [facility] ORS;  Service: General;  Laterality: Right;   BREAST SURGERY Left 2012   wide excision   BREAST SURGERY Left November 2013   Core biopsy of the upper-outer quadrant showed fat necrosis.   CHOLECYSTECTOMY  2007   COLONOSCOPY  2011   Dr Ora   COLONOSCOPY WITH PROPOFOL  N/A 02/25/2015   Procedure: COLONOSCOPY WITH PROPOFOL ;  Surgeon: Deward CINDERELLA Ora, MD;  Location: [facility] ENDOSCOPY;  Service: Gastroenterology;  Laterality: N/A;   CYSTOSCOPY WITH STENT PLACEMENT Left 11/05/2020   Procedure: CYSTOSCOPY WITH STENT PLACEMENT;  Surgeon: Twylla Glendia BROCKS, MD;  Location: [facility] ORS;  Service: Urology;  Laterality: Left;   CYSTOSCOPY/URETEROSCOPY/HOLMIUM LASER/STENT PLACEMENT Left 12/07/2020   Procedure: CYSTOSCOPY/URETEROSCOPY/HOLMIUM LASER/STENT PLACEMENT;  Surgeon: Twylla Glendia BROCKS, MD;  Location: [facility] ORS;  Service: Urology;  Laterality: Left;   DILATION AND CURETTAGE OF UTERUS  2004   HERNIA REPAIR Right 09/10/2007   Umbilical/ventral hernia repaired with 6.4 cm Proceed ventral patch   PORT-A-CATH REMOVAL  PORTACATH PLACEMENT  2013   RECURRENT HERNIA N/A 01/07/2008   Recurrent ventral hernia at the umbilicus, laparoscopy, open placement of a large Ultra Pro mesh with trans-fascial sutures.   UPPER GI ENDOSCOPY  2011    Family Psychiatric History: Please see initial evaluation for full details. I have reviewed the history. No updates at this time.    Family History:  Family History  Problem Relation Age of Onset   Lymphoma Father    Ovarian cancer Sister    Colon cancer Brother    Lymphoma Brother    Cancer Other        stomach   Cancer Other        stomach   Breast cancer Neg Hx      Social History:  Social History   Socioeconomic History   Marital status: Widowed    Spouse name: Not on file   Number of children: 2   Years of education: H/S   Highest education level: 12th grade  Occupational History   Occupation: Retired  Tobacco Use   Smoking status: Never   Smokeless tobacco: Never  Vaping Use   Vaping status: Never Used  Substance and Sexual Activity   Alcohol use: No   Drug use: No   Sexual activity: Not Currently  Other Topics Concern   Not on file  Social History Narrative   Lives alone   Social Drivers of Health   Tobacco Use: Low Risk (07/23/2024)   Patient History    Smoking Tobacco Use: Never    Smokeless Tobacco Use: Never    Passive Exposure: Not on file  Financial Resource Strain: Low Risk (03/19/2024)   Overall Financial Resource Strain (CARDIA)    Difficulty of Paying Living Expenses: Not hard at all  Food Insecurity: No Food Insecurity (03/19/2024)   Epic    Worried About Programme Researcher, Broadcasting/film/video in the Last Year: Never true    Ran Out of Food in the Last Year: Never true  Transportation Needs: No Transportation Needs (03/19/2024)   Epic    Lack of Transportation (Medical): No    Lack of Transportation (Non-Medical): No  Physical Activity: Insufficiently Active (03/19/2024)   Exercise Vital Sign    Days of Exercise per Week: 2 days    Minutes of Exercise per Session: 30 min  Stress: No Stress Concern Present (03/19/2024)   [employer] of Occupational Health - Occupational Stress Questionnaire    Feeling of Stress: Not at all  Social Connections: Unknown (03/19/2024)   Social Connection and Isolation Panel    Frequency of Communication with Friends and Family: Twice a week    Frequency of Social Gatherings with Friends and Family: Once a week    Attends Religious Services: Not on Insurance Claims Handler of Clubs or Organizations: No    Attends Banker Meetings: Never    Marital Status: Widowed  Depression  (PHQ2-9): Medium Risk (06/05/2024)   Depression (PHQ2-9)    PHQ-2 Score: 8  Alcohol Screen: Low Risk (03/19/2024)   Alcohol Screen    Last Alcohol Screening Score (AUDIT): 0  Housing: Unknown (03/19/2024)   Epic    Unable to Pay for Housing in the Last Year: No    Number of Times Moved in the Last Year: Not on file    Homeless in the Last Year: No  Utilities: Not At Risk (03/19/2024)   Epic    Threatened with loss of utilities: No  Health Literacy: Adequate  Health Literacy (03/19/2024)   B1300 Health Literacy    Frequency of need for help with medical instructions: Never    Allergies: Allergies[1]  Metabolic Disorder Labs: Lab Results  Component Value Date   HGBA1C 6.2 (H) 04/25/2024   MPG 171.42 09/07/2020   MPG 157.07 11/07/2018   No results found for: PROLACTIN Lab Results  Component Value Date   CHOL 110 10/14/2021   TRIG 151 (H) 10/14/2021   HDL 33 (L) 10/14/2021   CHOLHDL 3.3 10/14/2021   VLDL 18 09/07/2020   LDLCALC 51 10/14/2021   LDLCALC 57 09/07/2020   Lab Results  Component Value Date   TSH 2.583 06/25/2023   TSH 2.110 10/14/2021    Therapeutic Level Labs: No results found for: LITHIUM No results found for: VALPROATE No results found for: CBMZ  Current Medications: Current Outpatient Medications  Medication Sig Dispense Refill   acetaminophen  (TYLENOL ) 500 MG tablet Take 500 mg by mouth every 6 (six) hours as needed for mild pain or headache.      ARIPiprazole  (ABILIFY ) 5 MG tablet Take 1 tablet (5 mg total) by mouth daily. 90 tablet 0   atorvastatin  (LIPITOR) 40 MG tablet Take 1 tablet (40 mg total) by mouth every evening. 90 tablet 3   buPROPion  (WELLBUTRIN  XL) 300 MG 24 hr tablet Take 1 tablet (300 mg total) by mouth daily. 90 tablet 1   Calcium  Carb-Cholecalciferol  600-800 MG-UNIT TABS Take 1 tablet by mouth daily.     cholecalciferol  (VITAMIN D ) 1000 units tablet Take 1,000 Units by mouth daily.     clopidogrel  (PLAVIX ) 75 MG tablet Take  1 tablet (75 mg total) by mouth daily. 90 tablet 2   cyanocobalamin  (VITAMIN B12) 500 MCG tablet Take 1 tablet (500 mcg total) by mouth daily. 30 tablet 3   fluticasone  (FLONASE ) 50 MCG/ACT nasal spray Use 2 spray(s) in each nostril once daily 16 g 2   gabapentin  (NEURONTIN ) 100 MG capsule Take 1 capsule (100 mg total) by mouth 2 (two) times daily. 60 capsule 1   glucose blood (ONETOUCH VERIO) test strip Use as instructed to test blood sugar twice daily 100 strip 0   Lancets (ONETOUCH DELICA PLUS LANCET33G) MISC Use as instructed to test blood sugar twice daily 100 each 0   latanoprost  (XALATAN ) 0.005 % ophthalmic solution Place 1 drop into both eyes at bedtime.      losartan  (COZAAR ) 50 MG tablet Take 1 tablet (50 mg total) by mouth daily. 90 tablet 3   metFORMIN  (GLUCOPHAGE ) 1000 MG tablet TAKE 1 TABLET TWICE DAILY WITH MEALS 180 tablet 3   metoprolol  tartrate (LOPRESSOR ) 25 MG tablet Take 0.5 tablets (12.5 mg total) by mouth 2 (two) times daily. 90 tablet 2   Multiple Vitamin (MULTIVITAMIN WITH MINERALS) TABS tablet Take 1 tablet by mouth daily.     ondansetron  (ZOFRAN -ODT) 8 MG disintegrating tablet Take by mouth as needed.     pantoprazole  (PROTONIX ) 40 MG tablet Take 1 tablet (40 mg total) by mouth 2 (two) times daily. 180 tablet 3   pyridoxine  (B-6) 100 MG tablet Take 100 mg by mouth daily.     sertraline  (ZOLOFT ) 100 MG tablet Take 1.5 tablets (150 mg total) by mouth daily. 135 tablet 1   traMADol  (ULTRAM ) 50 MG tablet Take 50 mg by mouth at bedtime as needed.     traZODone  (DESYREL ) 50 MG tablet Take 0.5 tablets (25 mg total) by mouth at bedtime as needed for sleep. 45 tablet 1  vitamin C (ASCORBIC ACID ) 500 MG tablet Take 500 mg by mouth daily.     vitamin E  400 UNIT capsule Take 400 Units by mouth daily.     No current facility-administered medications for this visit.     Musculoskeletal: Strength & Muscle Tone: within normal limits Gait & Station: normal Patient leans:  N/A  Psychiatric Specialty Exam: Review of Systems  There were no vitals taken for this visit.There is no height or weight on file to calculate BMI.  General Appearance: {Appearance:22683}  Eye Contact:  {BHH EYE CONTACT:22684}  Speech:  Clear and Coherent  Volume:  Normal  Mood:  {BHH MOOD:22306}  Affect:  {Affect (PAA):22687}  Thought Process:  Coherent  Orientation:  Full (Time, Place, and Person)  Thought Content: Logical   Suicidal Thoughts:  {ST/HT (PAA):22692}  Homicidal Thoughts:  {ST/HT (PAA):22692}  Memory:  Immediate;   Good  Judgement:  {Judgement (PAA):22694}  Insight:  {Insight (PAA):22695}  Psychomotor Activity:  Normal  Concentration:  Concentration: Good and Attention Span: Good  Recall:  Good  Fund of Knowledge: Good  Language: Good  Akathisia:  No  Handed:  Right  AIMS (if indicated): not done  Assets:  Communication Skills Desire for Improvement  ADL's:  Intact  Cognition: WNL  Sleep:  {B[facility] GOOD/FAIR/POOR:22877}   Screenings: GAD-7    Loss Adjuster, Chartered Office Visit from 06/05/2024 in [facility] Office Visit from 04/25/2024 in [facility] Office Visit from 10/26/2023 in [facility] Office Visit from 07/20/2023 in [facility] Office Visit from 06/08/2023 in [facility]  Total GAD-7 Score 7 5 5 9 7    PHQ2-9    Flowsheet Row Office Visit from 06/05/2024 in [facility] Office Visit from 04/25/2024 in [facility] Clinical Support from 03/19/2024 in [facility] Office Visit from 10/26/2023 in [facility] Office Visit from 07/20/2023 in [facility]  PHQ-2 Total Score 4 2 0 3 4  PHQ-9 Total Score 8 7 0 8 16   Flowsheet Row Office Visit from 06/05/2024 in [facility] ED from 12/15/2022 in [facility] Department at [facility] ED from 11/28/2022 in [facility] at [facility]  C-SSRS RISK CATEGORY No Risk No Risk No Risk     Assessment and Plan:  Leah Schaefer is a 84 y.o. year old female with a history of depression, mixed dementia, frequent fall, history of 1 cm acute infarction in the right parietal deep and subcortical white matter causing left leg motor planning deficit, SVD, neuropathy, h/o breast cancer s/p chemotherapy, radiation, who is referred for depression.    1. MDD (major depressive disorder), recurrent episode, in partial remission Other stressors include:  loss of her husband in 2020, s/p stroke. chemotherapy History: reportedly has a history of not telling the truth to provider, either to minimize, or  falsely complaining symptoms She denies any depressive symptoms except what sounds to be abulia.  Will continue current medication regimen while working on behavioral activation.  Will continue sertraline  for depression, and bupropion  and Abilify  as adjunctive treatment for depression.    2. Compulsive skin picking Although she still has some skin picking, it has been overall manageable since being on Abilify .  Will continue current dose.    3. Mild neurocognitive disorder Functional Status  IADL: Independent in the following:            Requires assistance with the following: managing finances, medications, driving ADL  Independent in the following: bathing and hygiene, continence, toileting, walking (walker)          Requires assistance with the following: grooming, feeding,  Folate, Vitamin B12, TSH wnl 06/2023 Images Neuropsych assessment:  Etiology: vascular   NPS- VH, sundowning- improving - hold on donepezil , seen by Dr. Maree The exam is notable for calmer demeanor. According to the caregiver, there has been improvement in hallucinations since  uptitration of Abilify .   Will continue the current dose of Abilifyto target behavior issues associated with dementia and hallucinations.  Discussed potential metabolic side effect, EPS, QTc prolongation and increased mortality for people with dementia. Noted that Although there is concern of abulia, her daughter is not interested in medication adjustment, and prefers to work on behavioral activation.  It is also noted that abulia has slightly worsened in association with seasonal changes. Use of a lightbox was discussed as a potential intervention.   4. Insomnia, unspecified type Overall stable.  Will continue current dose of trazodone  as needed for insomnia.     Last checked  EKG HR 72, QTc455 msec 11/2022  Lipid panels TG 151 H, LDL 51 06/2022  HbA1c 6.2H 04/2024       Plan Continue sertraline  150 mg at night Continue bupropion  300 mg daily  Continue Abilify  5 mg daily  Continue trazodone  25 mg at night as needed for insomnia Next appointment: 1/29 at 3 pm, IP Consider light box The patient demonstrates the following risk factors for suicide: Chronic risk factors for suicide include: psychiatric disorder of depression . Acute risk factors for suicide include: unemployment and loss (financial, interpersonal, professional). Protective factors for this patient include: positive social support and hope for the future. Considering these factors, the overall suicide risk at this point appears to be low. Patient is appropriate for outpatient follow up.     Collaboration of Care: Collaboration of Care: {BH OP Collaboration of Care:21014065}  Patient/Guardian was advised Release of Information must be obtained prior to any record release in order to collaborate their care with an outside provider. Patient/Guardian was advised if they have not already done so to contact the registration department to sign all necessary forms in order for us  to release information regarding their care.   Consent:  Patient/Guardian gives verbal consent for treatment and assignment of benefits for services provided during this visit. Patient/Guardian expressed understanding and agreed to proceed.    Katheren Sleet, MD 08/31/2024, 10:12 AM     [1] No Known Allergies

## 2024-09-04 ENCOUNTER — Ambulatory Visit: Admitting: Psychiatry

## 2024-09-07 ENCOUNTER — Other Ambulatory Visit (HOSPITAL_COMMUNITY): Payer: Self-pay

## 2024-09-08 ENCOUNTER — Other Ambulatory Visit (HOSPITAL_COMMUNITY): Payer: Self-pay

## 2024-09-08 ENCOUNTER — Other Ambulatory Visit: Payer: Self-pay | Admitting: Family Medicine

## 2024-09-08 DIAGNOSIS — R4589 Other symptoms and signs involving emotional state: Secondary | ICD-10-CM

## 2024-09-08 DIAGNOSIS — K219 Gastro-esophageal reflux disease without esophagitis: Secondary | ICD-10-CM

## 2024-09-08 DIAGNOSIS — F339 Major depressive disorder, recurrent, unspecified: Secondary | ICD-10-CM

## 2024-09-08 DIAGNOSIS — I10 Essential (primary) hypertension: Secondary | ICD-10-CM

## 2024-09-08 MED ORDER — SERTRALINE HCL 100 MG PO TABS
150.0000 mg | ORAL_TABLET | Freq: Every day | ORAL | 1 refills | Status: AC
  Filled 2024-09-08: qty 135, 90d supply, fill #0

## 2024-09-08 MED ORDER — ATORVASTATIN CALCIUM 40 MG PO TABS
40.0000 mg | ORAL_TABLET | Freq: Every evening | ORAL | 3 refills | Status: AC
  Filled 2024-09-08: qty 90, 90d supply, fill #0

## 2024-09-08 MED ORDER — PANTOPRAZOLE SODIUM 40 MG PO TBEC
40.0000 mg | DELAYED_RELEASE_TABLET | Freq: Two times a day (BID) | ORAL | 1 refills | Status: AC
  Filled 2024-09-08: qty 180, 90d supply, fill #0

## 2024-09-08 MED ORDER — BUPROPION HCL ER (XL) 300 MG PO TB24
300.0000 mg | ORAL_TABLET | Freq: Every day | ORAL | 1 refills | Status: AC
  Filled 2024-09-08: qty 90, 90d supply, fill #0

## 2024-09-08 MED ORDER — LOSARTAN POTASSIUM 50 MG PO TABS
50.0000 mg | ORAL_TABLET | Freq: Every day | ORAL | 1 refills | Status: AC
  Filled 2024-09-08: qty 90, 90d supply, fill #0

## 2024-09-09 ENCOUNTER — Other Ambulatory Visit: Payer: Self-pay

## 2024-09-09 ENCOUNTER — Other Ambulatory Visit (HOSPITAL_COMMUNITY): Payer: Self-pay

## 2024-09-26 ENCOUNTER — Ambulatory Visit: Admitting: Family Medicine

## 2024-10-10 ENCOUNTER — Ambulatory Visit: Admitting: Family Medicine

## 2025-01-23 ENCOUNTER — Other Ambulatory Visit

## 2025-01-23 ENCOUNTER — Ambulatory Visit: Admitting: Internal Medicine

## 2025-03-25 ENCOUNTER — Ambulatory Visit

## 2026-07-26 ENCOUNTER — Ambulatory Visit: Admitting: Urology
